# Patient Record
Sex: Male | Born: 1935 | Race: Black or African American | Hispanic: No | Marital: Married | Smoking: Former smoker
Health system: Southern US, Community
[De-identification: ages and names within clinical notes are randomized; demographics above are authoritative.]

## PROBLEM LIST (undated history)

## (undated) DIAGNOSIS — Z8673 Personal history of transient ischemic attack (TIA), and cerebral infarction without residual deficits: Secondary | ICD-10-CM

## (undated) DIAGNOSIS — M1990 Unspecified osteoarthritis, unspecified site: Secondary | ICD-10-CM

## (undated) DIAGNOSIS — R001 Bradycardia, unspecified: Secondary | ICD-10-CM

## (undated) DIAGNOSIS — I5022 Chronic systolic (congestive) heart failure: Secondary | ICD-10-CM

## (undated) DIAGNOSIS — E1151 Type 2 diabetes mellitus with diabetic peripheral angiopathy without gangrene: Secondary | ICD-10-CM

## (undated) DIAGNOSIS — J449 Chronic obstructive pulmonary disease, unspecified: Secondary | ICD-10-CM

## (undated) DIAGNOSIS — I739 Peripheral vascular disease, unspecified: Secondary | ICD-10-CM

## (undated) DIAGNOSIS — Z862 Personal history of diseases of the blood and blood-forming organs and certain disorders involving the immune mechanism: Secondary | ICD-10-CM

## (undated) DIAGNOSIS — K219 Gastro-esophageal reflux disease without esophagitis: Secondary | ICD-10-CM

## (undated) DIAGNOSIS — C61 Malignant neoplasm of prostate: Secondary | ICD-10-CM

## (undated) DIAGNOSIS — I10 Essential (primary) hypertension: Secondary | ICD-10-CM

## (undated) DIAGNOSIS — I2510 Atherosclerotic heart disease of native coronary artery without angina pectoris: Secondary | ICD-10-CM

## (undated) DIAGNOSIS — I495 Sick sinus syndrome: Secondary | ICD-10-CM

## (undated) DIAGNOSIS — I219 Acute myocardial infarction, unspecified: Secondary | ICD-10-CM

## (undated) DIAGNOSIS — E785 Hyperlipidemia, unspecified: Secondary | ICD-10-CM

## (undated) DIAGNOSIS — D649 Anemia, unspecified: Secondary | ICD-10-CM

## (undated) DIAGNOSIS — I779 Disorder of arteries and arterioles, unspecified: Secondary | ICD-10-CM

## (undated) DIAGNOSIS — F0390 Unspecified dementia without behavioral disturbance: Secondary | ICD-10-CM

## (undated) DIAGNOSIS — N184 Chronic kidney disease, stage 4 (severe): Secondary | ICD-10-CM

## (undated) HISTORY — PX: CORONARY ANGIOPLASTY: SHX604

## (undated) HISTORY — DX: Atherosclerotic heart disease of native coronary artery without angina pectoris: I25.10

## (undated) HISTORY — DX: Hyperlipidemia, unspecified: E78.5

## (undated) HISTORY — DX: Personal history of diseases of the blood and blood-forming organs and certain disorders involving the immune mechanism: Z86.2

## (undated) HISTORY — DX: Disorder of arteries and arterioles, unspecified: I77.9

## (undated) HISTORY — DX: Peripheral vascular disease, unspecified: I73.9

## (undated) HISTORY — PX: CORONARY ARTERY BYPASS GRAFT: SHX141

## (undated) HISTORY — DX: Essential (primary) hypertension: I10

## (undated) HISTORY — DX: Chronic obstructive pulmonary disease, unspecified: J44.9

## (undated) HISTORY — DX: Bradycardia, unspecified: R00.1

## (undated) HISTORY — DX: Sick sinus syndrome: I49.5

## (undated) HISTORY — DX: Personal history of transient ischemic attack (TIA), and cerebral infarction without residual deficits: Z86.73

## (undated) HISTORY — DX: Malignant neoplasm of prostate: C61

## (undated) HISTORY — DX: Chronic kidney disease, stage 4 (severe): N18.4

## (undated) HISTORY — DX: Chronic systolic (congestive) heart failure: I50.22

---

## 1997-10-30 ENCOUNTER — Encounter: Payer: Self-pay | Admitting: *Deleted

## 1997-10-30 ENCOUNTER — Ambulatory Visit (HOSPITAL_COMMUNITY): Admission: RE | Admit: 1997-10-30 | Discharge: 1997-10-30 | Payer: Self-pay | Admitting: *Deleted

## 2000-04-27 ENCOUNTER — Encounter: Admission: RE | Admit: 2000-04-27 | Discharge: 2000-04-27 | Payer: Self-pay | Admitting: Internal Medicine

## 2000-04-27 ENCOUNTER — Encounter: Payer: Self-pay | Admitting: Internal Medicine

## 2000-05-21 ENCOUNTER — Encounter (INDEPENDENT_AMBULATORY_CARE_PROVIDER_SITE_OTHER): Payer: Self-pay | Admitting: Specialist

## 2000-05-21 ENCOUNTER — Other Ambulatory Visit: Admission: RE | Admit: 2000-05-21 | Discharge: 2000-05-21 | Payer: Self-pay | Admitting: Urology

## 2000-05-30 ENCOUNTER — Encounter: Admission: RE | Admit: 2000-05-30 | Discharge: 2000-05-30 | Payer: Self-pay | Admitting: Urology

## 2000-05-30 ENCOUNTER — Encounter: Payer: Self-pay | Admitting: Urology

## 2000-06-02 ENCOUNTER — Ambulatory Visit: Admission: RE | Admit: 2000-06-02 | Discharge: 2000-08-31 | Payer: Self-pay | Admitting: Radiation Oncology

## 2000-06-08 ENCOUNTER — Ambulatory Visit (HOSPITAL_COMMUNITY): Admission: RE | Admit: 2000-06-08 | Discharge: 2000-06-08 | Payer: Self-pay | Admitting: Radiation Oncology

## 2002-04-26 ENCOUNTER — Inpatient Hospital Stay (HOSPITAL_COMMUNITY): Admission: AD | Admit: 2002-04-26 | Discharge: 2002-05-15 | Payer: Self-pay | Admitting: Emergency Medicine

## 2002-04-26 ENCOUNTER — Encounter: Payer: Self-pay | Admitting: Emergency Medicine

## 2002-04-29 HISTORY — PX: CARDIAC CATHETERIZATION: SHX172

## 2002-05-01 ENCOUNTER — Encounter (INDEPENDENT_AMBULATORY_CARE_PROVIDER_SITE_OTHER): Payer: Self-pay | Admitting: Cardiovascular Disease

## 2002-05-01 ENCOUNTER — Encounter: Payer: Self-pay | Admitting: Internal Medicine

## 2002-05-02 ENCOUNTER — Encounter: Payer: Self-pay | Admitting: Internal Medicine

## 2002-05-05 ENCOUNTER — Encounter: Payer: Self-pay | Admitting: Internal Medicine

## 2002-05-06 ENCOUNTER — Encounter: Payer: Self-pay | Admitting: Cardiothoracic Surgery

## 2002-05-06 ENCOUNTER — Encounter (INDEPENDENT_AMBULATORY_CARE_PROVIDER_SITE_OTHER): Payer: Self-pay | Admitting: Specialist

## 2002-05-07 ENCOUNTER — Encounter: Payer: Self-pay | Admitting: Cardiothoracic Surgery

## 2002-05-08 ENCOUNTER — Encounter: Payer: Self-pay | Admitting: Cardiothoracic Surgery

## 2002-05-09 ENCOUNTER — Encounter: Payer: Self-pay | Admitting: Cardiothoracic Surgery

## 2002-05-10 ENCOUNTER — Encounter: Payer: Self-pay | Admitting: Cardiothoracic Surgery

## 2002-05-11 ENCOUNTER — Encounter: Payer: Self-pay | Admitting: Cardiothoracic Surgery

## 2002-05-12 ENCOUNTER — Encounter: Payer: Self-pay | Admitting: Cardiothoracic Surgery

## 2002-07-16 ENCOUNTER — Ambulatory Visit (HOSPITAL_COMMUNITY): Admission: RE | Admit: 2002-07-16 | Discharge: 2002-07-16 | Payer: Self-pay | Admitting: *Deleted

## 2002-08-18 ENCOUNTER — Encounter (HOSPITAL_COMMUNITY): Admission: RE | Admit: 2002-08-18 | Discharge: 2002-11-16 | Payer: Self-pay | Admitting: Cardiology

## 2002-11-17 ENCOUNTER — Encounter (HOSPITAL_COMMUNITY): Admission: RE | Admit: 2002-11-17 | Discharge: 2003-02-15 | Payer: Self-pay | Admitting: Cardiology

## 2004-01-08 ENCOUNTER — Ambulatory Visit: Payer: Self-pay | Admitting: Hematology & Oncology

## 2004-03-04 ENCOUNTER — Ambulatory Visit: Payer: Self-pay | Admitting: Hematology & Oncology

## 2004-05-17 ENCOUNTER — Inpatient Hospital Stay (HOSPITAL_COMMUNITY): Admission: EM | Admit: 2004-05-17 | Discharge: 2004-05-27 | Payer: Self-pay | Admitting: Emergency Medicine

## 2004-05-20 ENCOUNTER — Encounter (INDEPENDENT_AMBULATORY_CARE_PROVIDER_SITE_OTHER): Payer: Self-pay | Admitting: Specialist

## 2004-06-03 ENCOUNTER — Ambulatory Visit: Payer: Self-pay | Admitting: Hematology & Oncology

## 2004-07-23 HISTORY — PX: FEMORAL BYPASS: SHX50

## 2005-01-13 ENCOUNTER — Encounter: Admission: RE | Admit: 2005-01-13 | Discharge: 2005-01-13 | Payer: Self-pay | Admitting: Nephrology

## 2005-05-12 ENCOUNTER — Inpatient Hospital Stay (HOSPITAL_COMMUNITY): Admission: EM | Admit: 2005-05-12 | Discharge: 2005-05-18 | Payer: Self-pay | Admitting: Emergency Medicine

## 2005-05-16 ENCOUNTER — Encounter (INDEPENDENT_AMBULATORY_CARE_PROVIDER_SITE_OTHER): Payer: Self-pay | Admitting: Cardiovascular Disease

## 2005-05-17 HISTORY — PX: INSERT / REPLACE / REMOVE PACEMAKER: SUR710

## 2005-09-07 ENCOUNTER — Ambulatory Visit (HOSPITAL_COMMUNITY): Admission: RE | Admit: 2005-09-07 | Discharge: 2005-09-07 | Payer: Self-pay | Admitting: *Deleted

## 2005-10-10 ENCOUNTER — Ambulatory Visit (HOSPITAL_COMMUNITY): Admission: RE | Admit: 2005-10-10 | Discharge: 2005-10-10 | Payer: Self-pay | Admitting: *Deleted

## 2006-01-08 ENCOUNTER — Encounter (HOSPITAL_COMMUNITY): Admission: RE | Admit: 2006-01-08 | Discharge: 2006-04-04 | Payer: Self-pay | Admitting: Family Medicine

## 2007-02-13 ENCOUNTER — Inpatient Hospital Stay (HOSPITAL_COMMUNITY): Admission: EM | Admit: 2007-02-13 | Discharge: 2007-02-16 | Payer: Self-pay | Admitting: Emergency Medicine

## 2007-03-22 ENCOUNTER — Emergency Department (HOSPITAL_COMMUNITY): Admission: EM | Admit: 2007-03-22 | Discharge: 2007-03-22 | Payer: Self-pay | Admitting: Emergency Medicine

## 2007-04-02 ENCOUNTER — Ambulatory Visit: Payer: Self-pay | Admitting: Vascular Surgery

## 2007-04-02 ENCOUNTER — Inpatient Hospital Stay (HOSPITAL_COMMUNITY): Admission: EM | Admit: 2007-04-02 | Discharge: 2007-04-09 | Payer: Self-pay | Admitting: Emergency Medicine

## 2007-04-05 ENCOUNTER — Encounter: Payer: Self-pay | Admitting: Vascular Surgery

## 2007-04-23 ENCOUNTER — Ambulatory Visit: Payer: Self-pay | Admitting: Vascular Surgery

## 2007-05-07 ENCOUNTER — Ambulatory Visit: Payer: Self-pay | Admitting: Vascular Surgery

## 2007-05-16 ENCOUNTER — Encounter (INDEPENDENT_AMBULATORY_CARE_PROVIDER_SITE_OTHER): Payer: Self-pay | Admitting: Orthopedic Surgery

## 2007-05-16 ENCOUNTER — Observation Stay (HOSPITAL_COMMUNITY): Admission: RE | Admit: 2007-05-16 | Discharge: 2007-05-17 | Payer: Self-pay | Admitting: Orthopedic Surgery

## 2007-06-28 ENCOUNTER — Ambulatory Visit: Payer: Self-pay | Admitting: Vascular Surgery

## 2007-11-01 ENCOUNTER — Ambulatory Visit: Payer: Self-pay | Admitting: Vascular Surgery

## 2008-03-02 ENCOUNTER — Ambulatory Visit (HOSPITAL_COMMUNITY): Admission: RE | Admit: 2008-03-02 | Discharge: 2008-03-02 | Payer: Self-pay | Admitting: Urology

## 2008-05-08 ENCOUNTER — Ambulatory Visit: Payer: Self-pay | Admitting: Vascular Surgery

## 2008-05-12 ENCOUNTER — Inpatient Hospital Stay (HOSPITAL_COMMUNITY): Admission: EM | Admit: 2008-05-12 | Discharge: 2008-05-14 | Payer: Self-pay | Admitting: Emergency Medicine

## 2008-07-17 ENCOUNTER — Ambulatory Visit (HOSPITAL_BASED_OUTPATIENT_CLINIC_OR_DEPARTMENT_OTHER): Admission: RE | Admit: 2008-07-17 | Discharge: 2008-07-17 | Payer: Self-pay | Admitting: Urology

## 2008-11-20 ENCOUNTER — Ambulatory Visit: Payer: Self-pay | Admitting: Vascular Surgery

## 2009-03-15 ENCOUNTER — Encounter: Admission: RE | Admit: 2009-03-15 | Discharge: 2009-03-15 | Payer: Self-pay | Admitting: Internal Medicine

## 2009-05-21 ENCOUNTER — Ambulatory Visit: Payer: Self-pay | Admitting: Vascular Surgery

## 2009-11-13 ENCOUNTER — Encounter: Payer: Self-pay | Admitting: Internal Medicine

## 2009-11-30 ENCOUNTER — Ambulatory Visit: Payer: Self-pay | Admitting: Internal Medicine

## 2009-11-30 DIAGNOSIS — Z950 Presence of cardiac pacemaker: Secondary | ICD-10-CM

## 2009-11-30 DIAGNOSIS — I2510 Atherosclerotic heart disease of native coronary artery without angina pectoris: Secondary | ICD-10-CM

## 2009-12-14 ENCOUNTER — Encounter: Payer: Self-pay | Admitting: Internal Medicine

## 2010-01-03 ENCOUNTER — Ambulatory Visit: Payer: Self-pay | Admitting: Vascular Surgery

## 2010-01-12 ENCOUNTER — Ambulatory Visit: Payer: Self-pay | Admitting: Cardiovascular Disease

## 2010-02-12 ENCOUNTER — Encounter: Payer: Self-pay | Admitting: *Deleted

## 2010-02-13 ENCOUNTER — Encounter: Payer: Self-pay | Admitting: *Deleted

## 2010-02-24 NOTE — Cardiovascular Report (Signed)
Summary: Office Visit   Office Visit   Imported By: Sallee Provencal 12/10/2009 16:26:29  _____________________________________________________________________  External Attachment:    Type:   Image     Comment:   External Document

## 2010-02-24 NOTE — Miscellaneous (Signed)
Summary: Device preload  Clinical Lists Changes  Observations: Added new observation of PPM INDICATN: A-fib (11/13/2009 13:01) Added new observation of MAGNET RTE: BOL 98.6 ERI 86.3 (11/13/2009 13:01) Added new observation of PPMLEADSTAT2: active (11/13/2009 13:01) Added new observation of PPMLEADSER2: [id number] (11/13/2009 13:01) Added new observation of PPMLEADMOD2: 1336T (11/13/2009 13:01) Added new observation of PPMLEADDOI2: 05/17/2005 (11/13/2009 13:01) Added new observation of PPMLEADLOC2: RV (11/13/2009 13:01) Added new observation of PPMLEADSTAT1: active (11/13/2009 13:01) Added new observation of PPMLEADSER1: [id number] (11/13/2009 13:01) Added new observation of PPMLEADMOD1: [id number] (11/13/2009 13:01) Added new observation of PPMLEADDOI1: 05/17/2005 (11/13/2009 13:01) Added new observation of PPMLEADLOC1: RA (11/13/2009 13:01) Added new observation of PPM DOI: 05/17/2005 (11/13/2009 13:01) Added new observation of PPM SERL#: [id number]  (11/13/2009 13:01) Added new observation of PPM MODL#: [id number]  (11/13/2009 13:01) Added new observation of PACEMAKERMFG: St Jude  (11/13/2009 13:01) Added new observation of PPM IMP MD: Roe Rutherford, MD  (11/13/2009 13:01) Added new observation of PPM REFER MD: Vira Agar  (11/13/2009 13:01) Added new observation of PACEMAKER MD: Cristopher Peru, MD  (11/13/2009 13:01)      PPM Specifications Following MD:  Cristopher Peru, MD     Referring MD:  Vira Agar PPM Vendor:  [employer] Jude     PPM Model Number:  [id number]     PPM Serial Number:  [id number] PPM DOI:  05/17/2005     PPM Implanting MD:  Roe Rutherford, MD  Lead 1    Location: RA     DOI: 05/17/2005     Model #: [id number]     Serial #: [id number]     Status: active Lead 2    Location: RV     DOI: 05/17/2005     Model #: [id number]     Serial #: [id number]     Status: active  Magnet Response Rate:  BOL 98.6 ERI 86.3  Indications:  A-fib

## 2010-02-24 NOTE — Progress Notes (Signed)
Summary: [facility]: Progress Note  [facility]: Progress Note   Imported By: Roddie Mc 12/14/2009 11:24:50  _____________________________________________________________________  External Attachment:    Type:   Image     Comment:   External Document

## 2010-02-24 NOTE — Assessment & Plan Note (Signed)
Summary: pacer check/st. jude   History of Present Illness: Colin [name] is referred today by Dr. Acie Fredrickson for ongoing PPM evaluation.  The patient has a h/o symptomatic bradycardia and underwent PPM in 2007. He also has a h/o CAD and underwent CABG in 2004.  He has peripheral vascular disease.  He has class 1-2 CHF symptoms and an EF of 35%. He has not had syncope.  Current Medications (verified): 1)  None  Allergies (verified): No Known Drug Allergies  Past History:  Past Medical History: Last updated: 11/29/2009  Coronary disease, status post coronary bypass grafting 2004  Peripheral vascular disease, status post right fem-pop bypass in       2006, right toe amputation  Tachybrady syndrome, status post pacemaker implantation April 2007  Chronic renal insufficiency, creatinine baseline of 1.6 to 1.8  Cardiomyopathy  Hypertension  Cerebrovascular accident  Hyperlipidemia  COPD with 75 pack year history.  States he has not smoked since he had his myocardial infarction.   Appendectomy  Cholecystectomy  Cataract extraction  Chronic normocytic anemia with negative EGD and colonoscopy in 2004  Past Surgical History: Last updated: 11/29/2009 Cryoablation of prostate and insertion of suprapubic tube-2010 right toe amputation status post right fem-pop bypass in 2006 Cataract extraction  Family History: Non-contributory  Social History: Married Denies tobacco or ETOH abuse  Review of Systems       All systems reveiwed and negative except as noted in the HPI except for minimal claudication  Vital Signs:  Patient profile:   77 year old male Height:      71 inches Weight:      206 pounds BMI:     28.84 Pulse rate:   64 / minute BP sitting:   158 / 70  (left arm)  Vitals Entered By: Margaretmary Bayley CMA (November 30, 2009 2:14 PM) Comments Pt did not bring med list and does not know what medications he is on.   Physical Exam  General:  Well developed, well nourished, in no  acute distress. Head:  normocephalic and atraumatic Eyes:  PERRLA/EOM intact; conjunctiva and lids normal. Mouth:  Teeth, gums and palate normal. Oral mucosa normal. Neck:  Neck supple, no JVD. No masses, thyromegaly or abnormal cervical nodes. Chest Wall:  Well healed PPM incision Lungs:  Clear bilaterally to auscultation with no wheezes, rales, or rhonchi. Heart:  RRR with normal S1 and S2.  PMI is not enlarged or laterally displaced. No murmur. Abdomen:  Bowel sounds positive; abdomen soft and non-tender without masses, organomegaly, or hernias noted. No hepatosplenomegaly. Msk:  Back normal, normal gait. Muscle strength and tone normal. Neurologic:  Alert and oriented x 3.   PPM Specifications Following MD:  Cristopher Peru, MD     Referring MD:  Vira Agar PPM Vendor:  [facility] Jude     PPM Model Number:  [id number]     PPM Serial Number:  [id number] PPM DOI:  05/17/2005     PPM Implanting MD:  Roe Rutherford, MD  Lead 1    Location: RA     DOI: 05/17/2005     Model #: [id number]     Serial #: [id number]     Status: active Lead 2    Location: RV     DOI: 05/17/2005     Model #: [id number]     Serial #: [id number]     Status: active  Magnet Response Rate:  BOL 98.6 ERI 86.3  Indications:  A-fib   PPM Follow Up Remote  Check?  No Battery Voltage:  2.80 V     Battery Est. Longevity:  8.5 years     Pacer Dependent:  No       PPM Device Measurements Atrium  Amplitude: 1.3 mV, Impedance: 485 ohms, Threshold: 0.5 V at 0.5 msec Right Ventricle  Amplitude: 12 mV, Impedance: 417 ohms, Threshold: 0.375 V at 0.5 msec  Episodes MS Episodes:  4     Percent Mode Switch:  <1%     Atrial Pacing:  80%     Ventricular Pacing:  1%  Parameters Mode:  DDDR     Lower Rate Limit:  60     Upper Rate Limit:  110 Paced AV Delay:  275     Sensed AV Delay:  250 Next Cardiology Appt Due:  05/24/2010 Tech Comments:  Ventricular autocapture on.  Device function normal.   ROV 6 month clinic. Alma Friendly, LPN  November  8, 624THL  2:31 PM  MD Comments:  Agree with above.  Impression & Recommendations:  Problem # 1:  CARDIAC PACEMAKER IN SITU (ICD-V45.01) His current device is working normally and was reprogrammed today for improved longevity.  Will recheck in several months.  Problem # 2:  CORONARY ARTERY DISEASE (ICD-414.00) He denies anginal symptoms and will followup with Dr. Acie Fredrickson.  Problem # 3:  CHRONIC SYSTOLIC HEART FAILURE (123456) His symptoms appear to be class 1-2.  He will continue his current meds. ( He did not know his meds today).

## 2010-05-02 LAB — GLUCOSE, CAPILLARY
Glucose-Capillary: 272 mg/dL — ABNORMAL HIGH (ref 70–99)
Glucose-Capillary: 333 mg/dL — ABNORMAL HIGH (ref 70–99)

## 2010-05-02 LAB — POCT HEMOGLOBIN-HEMACUE: Hemoglobin: 11.9 g/dL — ABNORMAL LOW (ref 13.0–17.0)

## 2010-05-04 LAB — GLUCOSE, CAPILLARY
Glucose-Capillary: 167 mg/dL — ABNORMAL HIGH (ref 70–99)
Glucose-Capillary: 222 mg/dL — ABNORMAL HIGH (ref 70–99)
Glucose-Capillary: 78 mg/dL (ref 70–99)
Glucose-Capillary: 88 mg/dL (ref 70–99)

## 2010-05-04 LAB — COMPREHENSIVE METABOLIC PANEL
ALT: 34 U/L (ref 0–53)
AST: 26 U/L (ref 0–37)
Albumin: 3 g/dL — ABNORMAL LOW (ref 3.5–5.2)
Alkaline Phosphatase: 105 U/L (ref 39–117)
BUN: 64 mg/dL — ABNORMAL HIGH (ref 6–23)
Calcium: 9.1 mg/dL (ref 8.4–10.5)
Creatinine, Ser: 3.21 mg/dL — ABNORMAL HIGH (ref 0.4–1.5)
GFR calc Af Amer: 33 mL/min — ABNORMAL LOW (ref >60)
Glucose, Bld: 387 mg/dL — ABNORMAL HIGH (ref 70–99)
Potassium: 6 mEq/L — ABNORMAL HIGH (ref 3.5–5.1)
Sodium: 134 mEq/L — ABNORMAL LOW (ref 135–145)
Total Protein: 6.6 g/dL (ref 6.0–8.3)
Total Protein: 6.9 g/dL (ref 6.0–8.3)

## 2010-05-04 LAB — DIFFERENTIAL
Basophils Absolute: 0.1 10*3/uL (ref 0.0–0.1)
Basophils Relative: 1 % (ref 0–1)
Eosinophils Absolute: 0.1 10*3/uL (ref 0.0–0.7)
Eosinophils Relative: 1 % (ref 0–5)
Lymphocytes Relative: 17 % (ref 12–46)
Lymphs Abs: 1.1 10*3/uL (ref 0.7–4.0)
Lymphs Abs: 1.8 10*3/uL (ref 0.7–4.0)
Monocytes Absolute: 0.5 10*3/uL (ref 0.1–1.0)
Monocytes Relative: 10 % (ref 3–12)
Monocytes Relative: 8 % (ref 3–12)
Neutrophils Relative %: 52 % (ref 43–77)

## 2010-05-04 LAB — CARDIAC PANEL(CRET KIN+CKTOT+MB+TROPI)
Relative Index: 3.1 — ABNORMAL HIGH (ref 0.0–2.5)
Total CK: 372 U/L — ABNORMAL HIGH (ref 7–232)
Total CK: 386 U/L — ABNORMAL HIGH (ref 7–232)

## 2010-05-04 LAB — CBC
HCT: 34.4 % — ABNORMAL LOW (ref 39.0–52.0)
HCT: 36.4 % — ABNORMAL LOW (ref 39.0–52.0)
Hemoglobin: 11.5 g/dL — ABNORMAL LOW (ref 13.0–17.0)
MCV: 80.3 fL (ref 78.0–100.0)
MCV: 80.6 fL (ref 78.0–100.0)
RBC: 4.26 MIL/uL (ref 4.22–5.81)
RBC: 4.53 MIL/uL (ref 4.22–5.81)
RDW: 16 % — ABNORMAL HIGH (ref 11.5–15.5)
WBC: 5.1 10*3/uL (ref 4.0–10.5)
WBC: 6.2 10*3/uL (ref 4.0–10.5)

## 2010-05-04 LAB — CREATININE, URINE, RANDOM: Creatinine, Urine: 65.6 mg/dL

## 2010-05-04 LAB — URINE MICROSCOPIC-ADD ON

## 2010-05-04 LAB — CK TOTAL AND CKMB (NOT AT ARMC)
CK, MB: 13.4 ng/mL — ABNORMAL HIGH (ref 0.3–4.0)
Relative Index: 3.3 — ABNORMAL HIGH (ref 0.0–2.5)

## 2010-05-04 LAB — URINALYSIS, ROUTINE W REFLEX MICROSCOPIC
Glucose, UA: >1000 mg/dL — AB
Nitrite: NEGATIVE
Urobilinogen, UA: 0.2 mg/dL (ref 0.0–1.0)
pH: 5 (ref 5.0–8.0)

## 2010-05-04 LAB — HEPARIN LEVEL (UNFRACTIONATED): Heparin Unfractionated: <0.1 IU/mL — ABNORMAL LOW (ref 0.30–0.70)

## 2010-05-04 LAB — TROPONIN I: Troponin I: 0.04 ng/mL (ref 0.00–0.06)

## 2010-05-04 LAB — SODIUM, URINE, RANDOM: Sodium, Ur: 76 mEq/L

## 2010-05-04 LAB — UREA NITROGEN, URINE: Urea Nitrogen, Ur: 504 mg/dL

## 2010-06-07 NOTE — Assessment & Plan Note (Signed)
OFFICE VISIT   Colin [name], Colin [name]  DOB:  1928-01-26                                       11/20/2008  [id number]   Patient presents today for continued follow-up of his lower extremity  arterial insufficiency.  He continues to do well.  He does have a healed  right transmetatarsal amputation.  He denies any rest pain.  He does  have some hip pain.  He reports this occurs mostly with walking and does  appear to be in the joint itself.  It is not severely limiting to him  and is relieved when he sits.  It does make it somewhat difficult for  walking.  He has some more typical calf claudication-type symptoms too.  He reports this does not limit his walking ability and also stops when  he stops walking.   REVIEW OF SYSTEMS:  He has no cardiac or pulmonary dysfunction.  He does  have some mild renal dysfunction and is diabetic.   PHYSICAL EXAMINATION:  Well-developed and well-nourished black male  appearing stated age of 60.  His radial pulses and femoral pulses are 2+  bilaterally.  He has absent popliteal and distal pulses bilaterally.  He  has a well-healed transmetatarsal amputation on the right.  He has no  lesions on either feet and does not have any rashes.  He is grossly  intact neurologically.   I reviewed his noninvasive studies from today in our office.  This  reveals an ankle-arm index on the right of 0.29 and on the left of 0.54.  This is down slightly on the right and up slightly on the left.  I  discussed his known occlusion of his fem-pop bypass.  I have recommend  that he continue his walking program.  He will notify us should he  develop any rest pain or ulcerations.  Otherwise will be seen in our  vascular lab per protocol.   Rosetta Posner, M.D.  Electronically Signed   TFE/MEDQ  D:  11/20/2008  T:  11/23/2008  Job:  [id number]   cc:   Lynnell Chad. Shelia Media, M.D.

## 2010-06-07 NOTE — H&P (Signed)
NAME:  Colin [name], Colin [name] NO.:  [id number]   MEDICAL RECORD NO.:  [id number]          PATIENT TYPE:  INP   LOCATION:  1825                         FACILITY:  [facility]   PHYSICIAN:  Rosetta Posner, M.D.    DATE OF BIRTH:  [birth year]   DATE OF ADMISSION:  04/02/2007  DATE OF DISCHARGE:                              HISTORY & PHYSICAL   CHIEF COMPLAINT:  Pain in right foot and possible infection.   HISTORY OF PRESENT ILLNESS:  This is a 77 year old black male with a  history of right great toe ulceration which led to a right great toe  amputation on May 24, 2004.  Today, the patient went to his kidney  doctor, Dr. Justin Mend, and was also complaining of pain in his right foot.  Dr. Justin Mend felt it was appropriate to call [facility] for consultation.  Patient  later came to [facility] emergency department, and Dr. Donnetta Hutching saw the  patient.  The patient states that he has had right foot pain for several  weeks now.  The patient has taken Tylenol with no relief.  The patient  states that nothing alleviates the pain.  The patient is having pain at  rest and is unable to bear weight on foot.  The patient is not currently  taking any antibiotics for the right foot.  The patient does have a  history of a right femoral-to-popliteal bypass graft done in July, 2006,  and patient did not come to any of his follow-up appointments at [facility].  Per Dr. Donnetta Hutching, it is appropriate at this time to admit the patient to  [facility] for hydration and to complete an angiogram to  further evaluate the patient's options.  Patient will also be given 1 gm  Ancef IV now and then will continue that every 6 hours.  Also, the  patient will be given Percocet 5/500 1-2 tablets every 4 hours as needed  for pain.   REVIEW OF SYSTEMS:  See history of present illness for pertinent  positives and negatives.  Patient was just discharged from the hospital  on February 16, 2007 for acute-on-chronic renal failure, hypokalemia,  uncontrolled diabetes, hypertension, and hyperlipidemia.  Patient mainly  complains currently of pain in his right foot, probably due to  infection.   ALLERGIES:  Patient has no known drug allergies.   PAST MEDICAL HISTORY:  1. Chronic renal insufficiency with a baseline creatinine of 1.6 in      April, 2007.  Currently, it is at 1.9.  2. Diabetes mellitus type 2, uncontrolled.  3. Coronary artery disease with a history of MI, status post coronary      artery bypass graft at four vessels in April, 2004.  4. Peripheral vascular disease, status post right fem-pop bypass in      July, 2006 with right great toe amputation.  5. Tachy-brady syndrome, status post pacemaker placement in April,      2007.  6. Cardiomyopathy.  7. Hypertension.  8. Cerebrovascular accident.  9. Hyperlipidemia.  10.COPD with 75-pack-year history of tobacco use.  Discontinued since  2004.  11.Status post appendectomy.  12.Status post cholecystectomy.  13.Status post cataract extraction.  14.Chronic normocytic anemia with hemoglobin baseline anywhere from      9.5 to 11.5 with negative EGD and colonoscopy in June, 2004.   FAMILY HISTORY:  Noncontributory.   SOCIAL HISTORY:  Patient is not presently smoking but does have a  history of smoking and discontinued since 2004.  He is married and lives  in [place] with his wife.   OUTPATIENT MEDICATIONS:  1. Zetia 10 mg daily.  2. Aspirin 325 mg daily.  3. Detrol LA daily with meals.  4. Lantus 60 units daily with meals.  5. Metoprolol XL 25 mg daily.  6. Norvasc 5 mg daily.  7. Ramipril 10 mg daily.  8. Vitamin B12, dose unclear.   LABS:  None current.   PHYSICAL EXAMINATION:  VITAL SIGNS:  Blood pressure 140/72, heart rate  69, respirations 18, temperature 98.7, oxygen is 98% at this time on  room air.  GENERAL:  Well-developed and well-nourished gentleman in no acute  respiratory distress.  HEENT:  Normocephalic and atraumatic.  Pupils are  equal, round and  reactive to light and accommodation.  Extraocular movements are intact.  Exam of the external ears and nose revealed no abnormalities.  Oropharynx is clear with upper dentures in place and edentulous lower.  NECK:  Supple without lymphadenopathy, thyromegaly, or carotid bruits.  HEART:  Regular rate and rhythm with soft 2/6 systolic murmur heard best  at the left upper sternal border.  Patient also has a median sternotomy  scar.  LUNGS:  Clear to auscultation bilaterally.  ABDOMEN:  Soft, nontender, nondistended with active bowel sounds in all  quadrants.  No masses or hepatosplenomegaly.  He does have a small  umbilical hernia.  GENITAL/RECTAL:  Deferred.  EXTREMITIES:  Patient does have a scar in a bridge pattern from his  right thigh to just below his right knee and also a small scar at his  left knee.  Both of these are healed.  Patient's right foot is mildly  tender.  Dusky between third and fourth toes.  No abscess.  A 2+ right  femoral pulse.  A brisk Doppler at the right popliteal shin.  Doubt  Doppler flow in right dorsalis pedis and posterior tibials.  NEUROLOGIC:  Cranial nerves II-XII are grossly intact.  He is alert and  oriented x3.  Did not assess patient's gait.  He has symmetrical  strength in his upper and lower extremities.   ASSESSMENT/PLAN:  This is a 77 year old male with a history of  peripheral vascular disease and claudication who presents with right  foot pain.  The patient also has a past history of right great toe  amputation in July, 2006.  Patient will be admitted by Dr. Donnetta Hutching and  will be started on IV antibiotics and hydration with an angiogram in the  morning of April 03, 2007.      Darlin Coco, PA      Rosetta Posner, M.D.  Electronically Signed    ALW/MEDQ  D:  04/02/2007  T:  04/02/2007  Job:  [id number]

## 2010-06-07 NOTE — Op Note (Signed)
NAME:  Colin [name], Colin [name] NO.:  [id number]   MEDICAL RECORD NO.:  [id number]          PATIENT TYPE:  AMB   LOCATION:                               FACILITY:  [facility]   PHYSICIAN:  Lucina Mellow. Terance Hart, M.D.DATE OF BIRTH:  August 15, 1928   DATE OF PROCEDURE:  DATE OF DISCHARGE:                               OPERATIVE REPORT   PREOPERATIVE DIAGNOSIS:  Recurrent prostatic carcinoma.   POSTOPERATIVE DIAGNOSIS:  Recurrent prostatic carcinoma.   PROCEDURE:  Cryoablation of prostate and insertion of suprapubic tube.   SURGEON:  Orion Crook, MD   ANESTHESIA:  General.   INDICATIONS:  This 77 year old gentleman underwent radiation therapy in  2002 for a Gleason 8 carcinoma of the prostate and at that time his PSA  was 8.  His PSA went down to 0.65 but his PSA is now back up to 11.4 and  a biopsy showed recurrent carcinoma.  He was counseled about  cryoablation.  He was brought to the OR for that reason.  He was advised  about the risk of bleeding, infection, retention or urinary incontinence  postop.   PROCEDURE IN DETAIL:  The patient was brought to the operating room and  placed in lithotomy position after the induction of satisfactory general  anesthesia.  He was prepped and draped in the usual fashion.  He was  cystoscoped and the prostate was small.  The bladder had some  trabeculation.  I filled the bladder with irrigating fluid and then made  a puncture wound above the symphysis pubis and inserted a percutaneous  suprapubic tube through the anterior bladder wall and then produced a  full coil in the SP tube which was then connected to a drainage catheter  and the round silicone disk was attached to the SP tube and sewn to the  abdominal skin to secure the catheter in place.  Foley catheter was then  inserted.  Transrectal ultrasound was inserted and he underwent  treatment planning and he had a 25 gram prostate.  There were two  needles placed in the top row, two  needles placed in the middle row and  three needles placed in the bottom row.  I then recystoscoped him and  inserted a guidewire over which the urethral warming catheter was  inserted and noted to be in good position and functioned well.  He then  underwent two freeze and thaw cycles with good visual effect of the  freezing and a nice flat freeze curve on the bottom of the prostate  floor both on sagittal and transverse views.  Temperature sensors had  been placed in the Denonvilliers fascia and external sphincter and  remained at safe temperatures.  After completion of 20  minutes of the warming cycle after the second freeze the warming  catheter was removed and a suprapubic tube connected to closed drainage.  He was taken to the recovery room in good condition.  Blood loss was  minimal.  He tolerated the procedure well.      Lucina Mellow. Terance Hart, M.D.  Electronically Signed     LJP/MEDQ  D:  07/17/2008  T:  07/17/2008  Job:  [id number]   cc:   Lynnell Chad. Shelia Media, M.D.  Fax: [phone]

## 2010-06-07 NOTE — Op Note (Signed)
NAME:  Colin [name], HARVARD NO.:  [id number]   MEDICAL RECORD NO.:  [id number]          PATIENT TYPE:  INP   LOCATION:  [id number]                         FACILITY:  [facility]   PHYSICIAN:  Dorothea Glassman, M.D.    DATE OF BIRTH:  02-03-1928   DATE OF PROCEDURE:  04/03/2007  DATE OF DISCHARGE:                               OPERATIVE REPORT   PHYSICIAN:  Gordy Clement, MD.   DIAGNOSIS:  Ischemic right foot.   PROCEDURE:  Right lower extremity arteriogram.   ACCESS:  Left common femoral artery 5-French sheath.   CONTRAST:  40 miles Visipaque.   COMPLICATIONS:  None apparent.   CLINICAL NOTE:  [name] is a 77 year old male with diabetes and  chronic renal insufficiency and a history of peripheral vascular  disease.  Previous right femoral-popliteal bypass with toe amputation.  Presented with ulceration and pain of his right foot for a couple of  weeks.  Admitted to hospital yesterday and scheduled today to undergo  right lower extremity arteriography.   PROCEDURE NOTE:  The patient brought to the cath lab in stable  condition.  Placed in the supine position.  Both groins were prepped and  draped in sterile fashion.  Skin and subcutaneous tissue left groin  instilled with 1% Xylocaine.  A needle easily induced in the left common  femoral artery.  0.035 Wholey guidewire advanced through the needle into  the mid abdominal aorta.  The needle removed.  A 5-French sheath  advanced over guidewire.  Dilator removed, sheath flushed with heparin  saline solution.   A crossover catheter was then advanced over the guidewire and engaged in  the right common iliac artery.   Right iliac arteriography obtained in the LAO projection.  This revealed  the right common and external iliac arteries to be widely patent without  significant stenosis.  The right hypogastric artery was intact with a  moderate proximal stenosis.   A angled Glide wire was then advanced through the catheter  down into the  right external iliac artery.  The catheter exchange then made for an end-  hole catheter  which was advanced into the right external iliac artery.  Right lower extremity arteriography obtained.  This revealed the distal  right external iliac and right common femoral arteries to be patent.  The right profunda femoris artery was intact.  The proximal right  superficial femoral artery was patent.  The right mid thigh and abductor  area revealed severe disease with multiple areas of high-grade stenosis  in the right superficial femoral artery.  There was an occlusion of the  distal right superficial femoral artery, proximal right popliteal artery  with reconstitution of the popliteal artery at the level of the patella  and single-vessel diseased peroneal runoff to the right foot.   This completed the arteriogram procedure.  There were no apparent  complications.  The patient transferred to holding area in stable  condition.  Total contrast load was 40 mL.   FINAL IMPRESSION:  1. Patent right aorto-iliac segment.  2. Patent right common femoral artery.  3. Patent  and intact right profunda femoris artery.  4. Severely diseased right superficial femoral artery with multiple      areas of critical stenosis and occlusion of the distal right      superficial femoral artery popliteal junction.  Reconstitution of      the popliteal artery at the patella level with single-vessel      diseased right peroneal runoff.      Dorothea Glassman, M.D.  Electronically Signed     PGH/MEDQ  D:  04/03/2007  T:  04/04/2007  Job:  [id number]   cc:   Sherril Croon, M.D.

## 2010-06-07 NOTE — Procedures (Signed)
BYPASS GRAFT EVALUATION   INDICATION:  Right lower extremity bypass graft.   HISTORY:  Diabetes:  Yes.  Cardiac:  Yes.  Hypertension:  Yes.  Smoking:  No.  Previous Surgery:  Right fem-pop bypass graft on 04/11/2007 by Dr.  Kellie Simmering.   SINGLE LEVEL ARTERIAL EXAM                               RIGHT              LEFT  Brachial:                    142                139  Anterior tibial:             Not detected       65  Posterior tibial:            Not detected       60  Peroneal:                    61  Ankle/brachial index:        0.43               0.46   PREVIOUS ABI:  Date:  11/01/2007  RIGHT:  0.75  LEFT:  0.52   LOWER EXTREMITY BYPASS GRAFT DUPLEX EXAM:   DUPLEX:  No flow was detected in the right lower extremity bypass graft  with flow noted in the distal native vessel.  Flow was noted in the  right lower extremity popliteal artery most likely due to collateral  circulation.   IMPRESSION:  1. No Doppler flow as detected in the right lower extremity bypass      graft.  2. Significant decrease in the right ABI noted when compared to the      previous exam with the left ABI remaining stable.   ___________________________________________  Rosetta Posner, M.D.   CH/MEDQ  D:  05/08/2008  T:  05/08/2008  Job:  [id number]

## 2010-06-07 NOTE — Op Note (Signed)
NAME:  THOREN, KOBY NO.:  [id number]   MEDICAL RECORD NO.:  [id number]          PATIENT TYPE:  INP   LOCATION:  3310                         FACILITY:  [facility]   PHYSICIAN:  Nelda Severe. Kellie Simmering, M.D.  DATE OF BIRTH:  18-Dec-1928   DATE OF PROCEDURE:  04/05/2007  DATE OF DISCHARGE:                               OPERATIVE REPORT   PREOPERATIVE DIAGNOSIS:  Severe femoral popliteal and tibial occlusive  disease, right leg, with ischemic changes, right foot.   POSTOPERATIVE DIAGNOSIS:  Severe femoral popliteal and tibial occlusive  disease, right leg, with ischemic changes, right foot.   OPERATION:  Redo right common femoral to popliteal (above-knee) bypass  using 6-mm Gore-Tex (Propaten) bypass with intraoperative arteriogram.   SURGEON:  Nelda Severe. Kellie Simmering, M.D.   FIRST ASSISTANT:  Jacinta Shoe, P.A.   ANESTHESIA:  Is spinal.   PROCEDURE:  The patient was taken to the operating room, was placed in  the lateral position at which time satisfactory spinal anesthesia was  administered.  The patient was returned to the supine position.  Right  leg was prepped with Betadine scrub and solution and draped in routine  sterile manner.  Short longitudinal incision was made in the right  inguinal area, carried down through subcutaneous tissue.  There was an  old occluded femoral-popliteal Gore-Tex graft.  Proximal control was  obtained at the inguinal ligament at the distal external iliac artery  and superficial profunda femoris arteries dissected free for control  distally.  Medial incision was made through the previous scar above the  knee where the femoral-popliteal Gore-Tex graft was inserted previously.  Vein had been previously removed for heart surgery.  The popliteal  artery was exposed above the knee where the previous anastomosis had  been performed and was then exposed down as far as possible in the above-  knee position.  There was lot of dense scar tissue in  this area.  A new  6-mm Gore-Tex (Propaten) graft was tunneled in a subfascial plane.  The  patient was heparinized.  The popliteal artery was occluded proximally  and distally.  The previous Gore-Tex graft excised from the popliteal  artery.  Arteriotomy extended very slightly distally.  A 4.5 dilator  would traverse this vessel into the below-knee popliteal level.  There  was known severe tibial disease below the knee.  The Propaten graft was  anastomosed end-to-side with 6-0 Prolene.  Following this, attention  turned to the inguinal area where the vessels occluded with vascular  clamps, the previous Gore-Tex transected and excised almost completely  from the femoral artery which was a thickened diseased vessel but did  have excellent inflow.  Arteriotomy was extended slightly proximally,  Gore-Tex spatulated and anastomosed end-to-side using continuous 6-0  Prolene.  Clamps then released.  There was an excellent pulse in the  graft and good Doppler flow in the popliteal artery distal to the bypass  and audible Doppler flow in the posterior tibial artery at the ankle.  Intraoperative arteriogram revealed a widely  patent anastomosis with one-vessel runoff through what appeared to be  peroneal artery.  Protamine was given to reverse the heparin.  Following  adequate hemostasis, wounds were irrigated with saline, closed in layers  with Vicryl in a subcuticular fashion.  Sterile dressing applied.  The  patient taken to the recovery in satisfactory condition.      Nelda Severe Kellie Simmering, M.D.  Electronically Signed     JDL/MEDQ  D:  04/05/2007  T:  04/06/2007  Job:  [id number]

## 2010-06-07 NOTE — Discharge Summary (Signed)
NAME:  Colin [name], Colin [name] NO.:  [id number]   MEDICAL RECORD NO.:  [id number]          PATIENT TYPE:  INP   LOCATION:  2032                         FACILITY:  [facility]   PHYSICIAN:  Nelda Severe. Kellie Simmering, M.D.  DATE OF BIRTH:  01-Apr-1928   DATE OF ADMISSION:  04/02/2007  DATE OF DISCHARGE:                               DISCHARGE SUMMARY   Patient of Nelda Severe. Kellie Simmering, M.D.   CONSULTING PHYSICIAN:  Dr. Sharol Given.   PRINCIPAL DIAGNOSES:  1. Peripheral vascular disease with claudication in his right foot.      Patient of Dr. Justin Mend.  2. Chronic renal insufficiency.  3. Diabetes mellitus type 2.  4. Coronary artery disease with four vessel coronary artery bypass      grafting April 2004.  5. Peripheral vascular disease status post right fem-pop bypass July      2006 with right great toe amputation.  6. Tachybrady syndrome status post pacemaker placement April 2007.  7. Cardiomyopathy,  8. Hypertension.  9. History of CVA.  10.Hyperlipidemia.  11.COPD.  12.Chronic normocytic anemia with a baseline hemoglobin 9.5 to 11.5      with a negative EGD and colonoscopy June 2004.   PROCEDURES PERFORMED:  1. Right lower extremity arteriogram 04/04/2007.  2. Redo right femoropopliteal arterial bypass using 6 mm propaten      graft by Dr. Kellie Simmering.   DISCHARGE MEDICATIONS:  1. Aspirin 325 mg p.o. nightly.  2. Detrol LA 4 mg p.o. nightly.  3. Lantus insulin 50 units subQ nightly.  4. Metoprolol tartrate 25 mg p.o. daily.  5. NovoLog sliding scale subQ.  6. Altace 10 mg p.o. daily.  7. Vitamin B12.  8. Welchol  9. Zetia 10 mg p.o. daily.  10.Norvasc 5 mg p.o. daily.  11.Percocet 5/325 one p.o. every 4 hours p.r.n. pain.  12.Metanx and nitroglycerin patch to the ankle added by Dr. Rosita Kea.   DISPOSITION:  He is being discharged home in stable condition with his  wounds healing well.  He is instructed to wash his wounds with warm  water and soap.  He is to call for temperature greater than  101.2,  increasing redness, drainage or increased incisional pain.  He is to  call Dr. Jeoffrey Massed office for a followup appointment within the next two  weeks.  He will be given appointment to see Dr. Kellie Simmering in four weeks  with APIs prior to the visit and in two weeks for a wound check.   Brief identifying statement of complete details, please read further  typed history and physical.   Briefly, this is a very pleasant 77 year old gentleman who was referred  to [facility] for pain in his right foot.  He was  evaluated by Dr. Donnetta Hutching who felt that the discomfort was consistent with  claudication symptoms.  He felt he should be admitted for evaluation and  treatment.   HOSPITAL COURSE:  He was admitted to a bed on a convalescent floor.  He  underwent arteriogram on March 12, which had demonstrated poor flow to  his right foot.  It was felt that  he should undergo redo femoropopliteal  bypass.  He was informed of the risks and benefits of the procedure, and  after careful consideration, elected to proceed.   He was taken to the operating room on March 13 by Dr. Kellie Simmering and  underwent the aforementioned redo fem-pop bypass.  For complete details,  please refer to the typed operative report.  The procedure was without  complications.  He was returned to the post anesthesia care unit,  extubated.  Following stabilization he was transferred to a bed on a  surgical convalescent floor.  He was carefully followed.  He did receive  two units of blood for anemia.  This was due to his coronary artery  disease and history of cardiomyopathy with stage 1 chronic kidney  disease.  His insulin was resumed.   He was walking and improving with physical therapy.  His wounds are  healing well.  He was using a walker.  He was felt stable and was  discharged home on anticipated April 09, 2007.      Chad Cordial, PA      Nelda Severe Kellie Simmering, M.D.  Electronically Signed    KEL/MEDQ   D:  04/08/2007  T:  04/08/2007  Job:  [id number]   cc:   Nelda Severe. Kellie Simmering, M.D.  Newt Minion, MD  Sherril Croon, M.D.

## 2010-06-07 NOTE — H&P (Signed)
NAME:  Colin, [name] NO.:  [id number]   MEDICAL RECORD NO.:  [id number]          PATIENT TYPE:  INP   LOCATION:  77                         FACILITY:  [facility]   PHYSICIAN:  Rosetta Posner, M.D.    DATE OF BIRTH:  24-Aug-1928   DATE OF ADMISSION:  04/02/2007  DATE OF DISCHARGE:                              HISTORY & PHYSICAL   PRIMARY CARE PHYSICIAN:  Lynnell Chad. Pharr.   CHIEF COMPLAINT:  Right toe infection.   HISTORY OF PRESENT ILLNESS:  [name] is a 77 year old  gentleman with an extensive medical history as detailed below.  Patient  is having pain in his right root due to right toe infection.  Patient  notified office [facility] and felt it was best for patient to be admitted to  the hospital for hydration and angio for review in the morning.  Patient  does have a past history of a right fem to popliteal bypass graft and a  right great toe amp in July of 2006.  He has not been in for a followup  visit since surgery.  Patient is presenting today with several weeks of  history of pain.  Patient does have a significant past medical history  of chronic renal insufficiency with the creatinine currently at 1.6.  Patient also has history of diabetes and a CABG in 2004.  Dr. Donnetta Hutching saw  the patient in the ER today on arrival and felt that patient could  benefit if we admitted and continued to hydrate patient and have an  angio in the morning and plans for possibly right foot amputation upon  hospital stay.  Patient also will be given Ancef 1 g IV now and every 6  hours.  Patient also will be given Percocet 5/500 one to 2 tablets every  4 hours to help alleviate the pain.   Dictation ended at this point.      Darlin Coco, PA      Rosetta Posner, M.D.  Electronically Signed    ALW/MEDQ  D:  04/02/2007  T:  04/03/2007  Job:  [id number]

## 2010-06-07 NOTE — Discharge Summary (Signed)
NAME:  Colin [name], Colin [name] NO.:  [id number]   MEDICAL RECORD NO.:  [id number]          PATIENT TYPE:  INP   LOCATION:  2031                         FACILITY:  [facility]   PHYSICIAN:  Cherene Altes, M.D.DATE OF BIRTH:  1928-03-27   DATE OF ADMISSION:  02/13/2007  DATE OF DISCHARGE:  02/16/2007                               DISCHARGE SUMMARY   PRIMARY CARE PHYSICIAN:  Lynnell Chad. Shelia Media, M.D.   CARDIOLOGIST:  Iran Sizer, MD.   NEPHROLOGIST:  Sherril Croon, M.D.   DISCHARGE DIAGNOSES:  1. Acute on chronic renal failure.      a.     Baseline creatinine 1.6 to 1.8.      b.     Presenting creatinine 3.2.      c.     Discharge creatinine 1.8.  2. Hyperkalemia secondary to above - resolved.  3. Uncontrolled diabetes mellitus type 2.  4. Coronary artery disease with history of myocardial infarction      status post coronary artery bypass graft April 2004 with 4 vessel      treatment.  5. Peripheral vascular disease status post right fem-pop July 2006 and      status post right great toe amputation with pending right lower      extremity peripheral catheterization and possible stenting.  6. Tachy-brady syndrome status post pacemaker placement April 2007.  7. Cardiomyopathy.  8. Hypertension.  9. History of cerebrovascular accident.  10.Hyperlipidemia.  11.Chronic obstructive pulmonary disease with 75 pack year tobacco      history but currently abstaining.  12.Status post appendectomy.  13.Status post cholecystectomy.  14.Status post cataract extraction.  15.Chronic normocytic anemia likely related to renal disease with      baseline creatinine 9.5 to 11.5 - negative EGD and colonoscopy July      2004.   DISCHARGE MEDICATIONS:  1. Zetia 10 mg p.o. daily.  2. Welchol 625 mg p.o. daily.  3. Metoprolol 25 mg p.o. daily.  4. Vitamin B12 daily.  5. Bayer aspirin 325 mg daily.  6. Detrol LA 2 mg daily.  7. Amlodipine 5 mg daily.  8. Lantus insulin 60 units daily  at bedtime.  9. The patient is instructed to hold Ramipril until further evaluation      by his primary care physician.   FOLLOWUP:  1. The patient is advised to follow up with Dr. Shelia Media in 5-7 days.  At      that time a basic metabolic panel should be obtained to ensure that      the patient's potassium and his creatinine remain balanced.  An      evaluation of the patient's CBG should also be obtained to assess      the need for adjustment of the patient's treatment regimen.  2. The patient is advised to follow up with Dr. Glade Lloyd within 5-7      days, to reschedule his previously scheduled right lower extremity      peripheral arterial catheterization.  Given his acute renal failure      it is likely advisable to avoid a  dye load for at least 10-14 days.      Would certainly advise holding his ACE inhibitor prior to his      procedure as well.   PROCEDURES:  None.   CONSULTATIONS:  None.   HOSPITAL COURSE:  [name] is a very pleasant 77 year old  gentleman with a complex medical history.  He presented to the hospital  on February 13, 2007, after preperipheral arterial cath orders done in  the office revealed severe hyperkalemia and acute renal failure.  The  patient himself was asymptomatic.  The patient was admitted to the acute  unit.  He required a bicarbonate drip.  He required emergency procedures  to provide cardioprotective benefits from hyperkalemia and then  Kayexalate therapy.  With simple hydration the patient's renal function  returned to its baseline.  Bicarb drip was able to be discontinued.  Potassium was stabilized.  ACE inhibitor has been discontinued for now  and the advisability of resuming this in the outpatient setting should  be considered by the patient's primary care physician.   The patient's diabetes proved to be quite difficulty to manage during  his hospital stay as well.  On his home dose of Lantus therapy on a very  restricted diet his  blood sugar actually became quite low on a number of  occasions.  It is felt that he is poorly compliant at home.  At the time  of his discharge the blood sugar has stabilized somewhat but there are  intermittent spells in which it does increase as high as 200.  I am  returning the patient to his home regimen as I do not feel that he is at  risk for hypoglycemia at home as he is very likely to be noncompliant  with his diet, but close monitoring of CBGs in the outpatient setting is  recommended however and continued counseling as to the need for  compliance as has been done in the hospital.   On February 16, 2007, the patient's renal function had improved to his  baseline.  Vital signs were stable and the patient was afebrile.  The  patient was cleared for discharge home on the above listed medical  regimen with followup as described above.   DISCHARGE LABORATORY:  Sodium is 137, potassium 3.5, bicarb 35,  creatinine 1.8 and BUN 33 on February 16, 2007.      Cherene Altes, M.D.  Electronically Signed    JTM/MEDQ  D:  02/16/2007  T:  02/16/2007  Job:  [id number]   cc:   Sherril Croon, M.D.  Iran Sizer, MD  Lynnell Chad. Shelia Media, M.D.

## 2010-06-07 NOTE — Op Note (Signed)
NAME:  Colin [name], HAYEN NO.:  [id number]   MEDICAL RECORD NO.:  [id number]          PATIENT TYPE:  OBV   LOCATION:  [id number]                         FACILITY:  [facility]   PHYSICIAN:  Newt Minion, MD     DATE OF BIRTH:  April 28, 1928   DATE OF PROCEDURE:  05/16/2007  DATE OF DISCHARGE:                               OPERATIVE REPORT   PREOPERATIVE DIAGNOSIS:  Gangrene, right forefoot.   POSTOPERATIVE DIAGNOSIS:  Gangrene, right forefoot.   PROCEDURE:  Right midfoot amputation   SURGEON:  Newt Minion, MD.   ANESTHESIA:  General.   ESTIMATED BLOOD LOSS:  Minimal.   ANTIBIOTICS:  1 g of Kefzol.   DRAINS:  None.   COMPLICATIONS:  None.   TOURNIQUET TIME:  None.   DISPOSITION:  To PACU in stable condition.   INDICATIONS FOR PROCEDURE:  The patient is a 77 year old gentleman with  peripheral vascular disease.  He has had a progressive gangrene of the  right forefoot.  He was not felt to be a revascularization candidate.  He has undergone conservative care with Metanx and nitroglycerin patches  without relief as well as wound care, and presents at this time for  midfoot amputation.  Risks and benefits were discussed including  persistent infection, neurovascular injury, nonhealing of the wound, and  need for a higher-level amputation.  The patient states he understands  and wished to proceed at this time.   DESCRIPTION OF PROCEDURE:  The patient was brought to the OR room 3, and  underwent general anesthetic.  After adequate level of anesthesia  obtained, the patient's right lower extremity was prepped using DuraPrep  and draped in a sterile field, and the gangrenous forefoot was draped  with Charlie Pitter out of the surgical field.  The foot was amputated with a  fishmouth incision through the midfoot.  This was carried sharply down  to bone.  The metatarsals were resected at the base, were then beveled  plantarly and dorsally.  Electrocautery was used for hemostasis.   The  wound was irrigated with normal saline.  There was some petechial  bleeding.  The skin was closed without any  tension on the skin with a far-near-near-far suture with 2-0 nylon.  The  wound was covered with Adaptic, orthopedic sponges, sterile Webril, and  a Coban dressing.  The patient was extubated and taken to PACU in stable  condition.   Plan for 23-hour observation, discharge in the morning.      Newt Minion, MD  Electronically Signed     MVD/MEDQ  D:  05/16/2007  T:  05/16/2007  Job:  [id number] [id number]

## 2010-06-07 NOTE — Procedures (Signed)
BYPASS GRAFT EVALUATION   INDICATION:  Follow-up evaluation of lower extremity bypass graft.   HISTORY:  Diabetes:  Yes.  Cardiac:  Yes.  Hypertension:  Yes.  Smoking:  No.  Previous Surgery:  Right fem-pop bypass graft on 04/11/07 by Dr. Kellie Simmering  with Gore-Tex.   SINGLE LEVEL ARTERIAL EXAM                               RIGHT              LEFT  Brachial:                    160                170  Anterior tibial:             70                 74  Posterior tibial:            128                88  Peroneal:  Ankle/brachial index:        0.75               0.52   PREVIOUS ABI:  Date: 06/28/07  RIGHT:  0.78  LEFT:  0.52   LOWER EXTREMITY BYPASS GRAFT DUPLEX EXAM:   DUPLEX:  Doppler arterial waveforms are biphasic proximal and within the  bypass graft and monophasic distal to the bypass grafting.   IMPRESSION:  1. Ankle brachial indices are stable from previous study bilaterally.  2. Patent femoropopliteal bypass graft.   ___________________________________________  Colin [name], M.D.   MC/MEDQ  D:  11/01/2007  T:  11/01/2007  Job:  [id number]

## 2010-06-07 NOTE — Procedures (Signed)
BYPASS GRAFT EVALUATION   INDICATION:  Followup, right lower extremity bypass graft.   HISTORY:  Diabetes:  Yes.  Cardiac:  Yes.  Hypertension:  Yes.  Smoking:  No.  Previous Surgery:  Redo right fem-pop bypass graft, 04/05/07, by Dr.  Kellie Simmering, with recent amputations on right foot.   SINGLE LEVEL ARTERIAL EXAM                               RIGHT              LEFT  Brachial:                    190                185  Anterior tibial:             150                90  Posterior tibial:            140                100  Peroneal:  Ankle/brachial index:        0.78               0.52   PREVIOUS ABI:  Date: 05/07/07  RIGHT:  0.68  LEFT:  0.54   LOWER EXTREMITY BYPASS GRAFT DUPLEX EXAM:   DUPLEX:  Doppler arterial waveforms appear brisk monophasic, proximal  to, within, and distal to the bypass graft.   IMPRESSION:  1. Patent right femoral-popliteal artery bypass graft.  2. Stable ankle brachial indices bilaterally.      ___________________________________________  Colin [name], M.D.   AS/MEDQ  D:  06/28/2007  T:  06/28/2007  Job:  [id number]

## 2010-06-07 NOTE — Assessment & Plan Note (Signed)
OFFICE VISIT   EBER, HAWK  DOB:  [birth year]                                       05/07/2007  [id number]   Patient is a redo right femoral popliteal bypass with 6 mm Gore-Tex by  me on 03/13 for ischemic changes in the right foot.  He has gangrene of  toes 3 and 4, which was seen by Dr. Sharol Given in the hospital.  The bypass  has functioned well since his discharge, but he does have some edema, 1-  2+, in the right leg.  The incisions have healed nicely, and ABI is  0.68, which is consistent with his one vessel peroneal run-off.   He continues to have gangrene at the distal aspect of toes 3 and 4 and  eventually will require more formal amputation.  We will call today and  have him get the appointment with Dr. Sharol Given for further followup.  We  will continue to follow him on the protocol for his right  femoropopliteal bypass graft.   Nelda Severe Kellie Simmering, M.D.  Electronically Signed   JDL/MEDQ  D:  05/07/2007  T:  05/08/2007  Job:  [id number]

## 2010-06-07 NOTE — H&P (Signed)
NAME:  Colin [name] NO.:  [id number]   MEDICAL RECORD NO.:  [id number]          PATIENT TYPE:  EMS   LOCATION:  [facility]                         FACILITY:  [facility]   PHYSICIAN:  Cherene Altes, M.D.DATE OF BIRTH:  08/18/1928   DATE OF ADMISSION:  02/13/2007  DATE OF DISCHARGE:                              HISTORY & PHYSICAL   PRIMARY CARE PHYSICIAN:  Lynnell Chad. Shelia Media, M.D.   CARDIOLOGIST:  Iran Sizer, M.D.   CHIEF COMPLAINT:  Hyperkalemia and acute renal failure.   HISTORY OF PRESENT ILLNESS:  [name] is a very pleasant 77-  year-old gentleman with an extensive medical history as detailed below.  He has been having difficulty with claudication and was scheduled to  undergo an outpatient peripheral artery catheterization with possible  intervention on February 14, 2007.  Routine preop labs were obtained on  February 12, 2007, at his primary care physician's office.  This morning  those labs returned and revealed a potassium of 6.9 with a creatinine of  3.2.  The patient was called and asked to present immediately to the  emergency room.  In the emergency room, EKG without benefit of old EKGs  for comparison at the present time suggests hyperacute T waves in leads  II, III, aVF and V4.  The patient himself is entirely asymptomatic.  He  denies chest pain, fevers, chills, nausea, vomiting, shortness of  breath.  He states that he has been in his usual state of health with  the exception to claudication symptoms in his right lower extremity.   REVIEW OF SYSTEMS:  Comprehensive review of systems is unremarkable with  the exception of the positive element noted in the history of present  illness above, namely, his claudication symptoms in his right lower  extremity.   PAST MEDICAL HISTORY:  1. Chronic renal insufficiency with baseline creatinine of 1.28 April 2005.  2. Diabetes mellitus type 2 - uncontrolled.  3. Coronary artery disease with  history of MI, status post coronary      artery bypass graft x 4 vessels April 2004.  4. Peripheral vascular disease, status post right fem-pop bypass July      2006 with right great toe amputation.  5. Tachybrady syndrome, status post pacemaker placement April 2007.  6. Cardiomyopathy.  7. Hypertension.  8. Cerebrovascular accident.  9. Hyperlipidemia.  10.Chronic obstructive pulmonary disease with 75-pack-year history of      tobacco abuse.  Discontinued since 2004.  11.Status post appendectomy.  12.Status post cholecystectomy.  13.Status post cataract extraction.  14.Chronic normocytic anemia with hemoglobin baseline anywhere from      9.5 to 11.5 with negative EGD and colonoscopy June 2004.   OUTPATIENT MEDICATIONS:  1. Zetia 10 mg daily.  2. Aspirin 325 mg daily.  3. Detrol LA q.h.s.  4. Lantus 60 __________ q.h.s.  5. Metoprolol XL 25 mg daily.  6. Norvasc 5 mg daily.  7. Ramipril 10 mg daily.  8. Vitamin B12 - dose unclear.   ALLERGIES:  No known drug allergies.   FAMILY HISTORY:  Noncontributory.   SOCIAL HISTORY:  The patient is not presently smoking.  He does not  drink.  He is married and lives in [place] with his wife.   DATA REVIEWED:  Labs from Dr. Pennie Banter office revealed a hemoglobin of  12.8 and otherwise normal CBC.  Sodium 124, potassium 6.9, chloride 95,  bicarb 16, BUN 72, creatinine 3.2, serum glucose 446.  INR is 1.06 with  a PT of 11.5.  A 12-lead EKG suggested hyperacute T waves in II, III,  aVF, V4 and V6.   PHYSICAL EXAMINATION:  VITAL SIGNS:  Temperature 97.6, blood pressure  123/65, heart rate 61, respiratory rate 18, O2 sat 99% on room air.  GENERAL:  Well-developed, well-nourished gentleman in no acute  respiratory distress.  HEENT:  Normocephalic, atraumatic.  Pupils are equal, round and reactive  to light and accommodation.  Extraocular muscles intactbilaterally.  OC/OPclear.  NECK:  No JVD, no lymphadenopathy, no thyromegaly.  LUNGS:   Clear to auscultation bilaterally without wheezes or rhonchi.  CARDIOVASCULAR:  Regular rate and rhythm without murmur, gallop or rub  with normal S1 and S2.  ABDOMEN:  Nontender, nondistended, soft.  Bowel sounds present.  No  hepatosplenomegaly.  No rebound.  No ascites.  EXTREMITIES:  No significant clubbing, cyanosis or edema, bilateral  lower extremities.  There is pain to palpation in the right lower  extremity but no obvious erythema or localized wound.  NEUROLOGIC:  Alert and oriented x 4.  Cranial nerves II-XII are intact  bilaterally.  There is 5/5 strength, bilateral upper and lower  extremities.  Intact sensation to touch throughout.   IMPRESSION/PLAN:  1. Hyperkalemia - the patient is suffering with hyperkalemia.  Vital      signs are stable at present.  There does appear to be a cardiac      effect at the present time.  We will treat the patient acutely with      calcium gluconate, an amp of bicarb and insulin using his elevated      serum glucose to our advantage.  We will then dose the patient with      Kayexalate.  We will follow up BMET later today to ensure that his      potassium is improving.  We will monitor him on telemetry at all      times.  2. Acute renal failure - the patient will be treated with hydration.      We will hold his ACE inhibitor.  Urinalysis will be obtained.  If      the patient's renal function does not improve with hydration we      will consider renal ultrasound.  Bicarbonate fluid will be      administered.  ABGs will be followed.  3. Peripheral vascular disease - Dr. Glade Lloyd has been contacted by      Dr. Pennie Banter office.  At this time, the peripheral procedure will      have to be canceled.  It is quite possible that this could be      accomplished during his hospital stay, however, if the patient      stabilizes and renal function improves.  It may, however, be most      advisable to reschedule this for a later point to avoid contrast       exposure at the present time.  4. Hypertension.  The blood pressure is currently well-controlled.  We      will continue metoprolol and Norvasc but  we will have to hold ACE      inhibitor.  5. Hyperlipidemia.  We will continue Zetia.  6. Coronary artery disease.  The patient is stable from a cardiac      standpoint with the exception of his hyperacute T waves at the      present time.  There is no history to suggest angina.      Cherene Altes, M.D.  Electronically Signed     JTM/MEDQ  D:  02/13/2007  T:  02/13/2007  Job:  [id number]   cc:   Lynnell Chad. Shelia Media, M.D.  Iran Sizer, MD

## 2010-06-07 NOTE — H&P (Signed)
NAME:  Colin [name], Colin [name] NO.:  [id number]   MEDICAL RECORD NO.:  [id number]          PATIENT TYPE:  INP   LOCATION:  [place]                         FACILITY:  [facility]   PHYSICIAN:  Alcide Evener, MD  DATE OF BIRTH:  09/06/1928   DATE OF ADMISSION:  05/12/2008  DATE OF DISCHARGE:                              HISTORY & PHYSICAL   CHIEF COMPLAINT:  Weakness.   Colin [name] is a 77 year old African American gentleman with past medical  history of multiple medical problems including coronary artery disease,  status post coronary artery bypass grafting, peripheral vascular  disease, diabetes mellitus which is poorly controlled, cardiomyopathy,  hypertension, cerebrovascular accident, COPD who states that he had  recently been seeing Colin [name] for possible colonoscopy and was told  to stop his aspirin.  He was concerned that aspirin might be in all of  his medicines so he, therefore, stopped all of his medicines although he  claims he still is taking his insulin.  He had been recently worked up  for worsening renal insufficiency and also had been seen by his primary  care physician today and was found to be hypotensive and hyperglycemic  with a blood sugar in the 500 range.  He is brought to the emergency  department for further evaluation.   In the ED the patient's labs were notable for hyponatremia with sodium  128, potassium 6, bicarb 21, BUN of 64, creatinine 3.2 with a creatinine  obtained last year if about 1.7.  He was started on Glucomander drip  with intravenous fluids and we are asked to admit the patient to the  hospitalist service.  His EKG did not show any peaked T-waves or acute  ST-T wave changes compared to prior EKG.   PAST MEDICAL HISTORY:  1. Coronary disease, status post coronary bypass grafting 2004.  2. Peripheral vascular disease, status post right fem-pop bypass in      2006, right toe amputation.  3. Tachybrady syndrome, status post pacemaker  implantation April 2007.  4. Chronic renal insufficiency, creatinine baseline of 1.6 to 1.8.  5. Cardiomyopathy.  6. Hypertension.  7. Cerebrovascular accident.  8. Hyperlipidemia.  9. COPD with 75 pack year history.  States he has not smoked since he      had his myocardial infarction.  10.Appendectomy.  11.Cholecystectomy.  12.Cataract extraction.  13.Chronic normocytic anemia with negative EGD and colonoscopy in      2004.   FAMILY HISTORY:  Noncontributory.   SOCIAL HISTORY:  The patient does not smoke now.  He does not drink.  He  lives with Colin [name] here in [place].   ALLERGIES:  No known drug allergies.   CURRENT MEDICATIONS:  1. Zetia 10 mg daily.  2. Aspirin 81 mg daily.  3. Humalog 50/50 which he takes 30 in the morning and 20 at night.  4. Ramipril 10 mg daily.  5. Vitamin B12 one tablet daily.   REVIEW OF SYSTEMS:  Review of systems positive for weakness, malaise,  polyuria.  He denies chest pain, denies dyspnea.  He has had a  productive cough  with clear phlegm.  He has not had fevers.  He has not  had nausea, vomiting, blood per rectum, abdominal pain or increased  edema.   PHYSICAL EXAMINATION:  VITAL SIGNS: Bilateral in the emergency  department was in the 121/60 range, up to 139/90, pulse in the 60s.  Breathing 16-18 times a minute.  Pulse oximetry 97-99% on room air.  Temperature max was 97.6.  GENERAL:  Pleasant gentleman, no acute distress.  HEENT:  Normocephalic, atraumatic.  PERRL.  Sclerae anicteric.  Oropharynx clear without erythema or exudate.  CARDIOVASCULAR:  Regular rate and rhythm.  No murmurs, gallops or rubs  heard.  LUNGS:  Diminished breath sounds at the bases, but otherwise clear.  No  expiratory wheezes.  ABDOMEN:  Soft, nondistended, nontender.  EXTREMITIES:  1+ pretibial edema.  Right foot with transmetatarsal  amputation.  Site healed well.  Left foot with onychomycosis of the  toenails.  NEUROLOGIC:  The patient was oriented  to person, place, to month but not  to year or date.  He could not remember the bypass graft of his heart or  his leg and could not remember his cholecystectomy.  Otherwise he had a  nonfocal neurological examination.   LABORATORY DATA:  An EKG shows pacing via atrial pacemaker, left  ventricular hypertrophy, ST segment and T wave abnormalities associated  with this but no changes compared to EKG done May 14, 2007.  Repeat  potassium was 4.8. Urine 0-2 rbc's, few bacteria.  Urinalysis showed  specific gravity 1.022, pH 5, glucose 1000,  1000, bilirubin small,  trace blood, negative for nitrites and leukocyte esterase.  BMP showed  sodium 128, potassium 6, chloride 113, bicarb 21, glucose 387, BUN 64,  creatinine 3.2.  Bilirubin 1.5, alk phos 105, AST 51, ALT 45, albumin  3.3.  CBC with differential:  White count 6.8, hemoglobin 12, platelets  207,000, ANC 4.5.   ASSESSMENT/PLAN:  This is a 77 year old African American gentleman with  multiple medical problems including coronary artery disease, coronary  artery bypass grafting, hyperlipidemia, hypertension, poorly controlled  diabetes mellitus.  He has stopped most of his medications at home  although he claims he has been taking his insulin.  He presents with  worsening renal insufficiency, polyuria and hyperglycemia without  ketosis or metabolic acidosis and renal failure.  1. Hyperglycemia.  The patient has been placed on a Glucomander with      normal saline.  We will continue this and when his blood sugar is      normalized, start him back on home regimen of NovoLog.  2. Acute renal insufficiency.  Likely prerenal potentially due to      poorly controlled diabetes and polyuria.  As mentioned, will give      him normal saline at 200 an hour and vigorously hydrate him and      take caution given his history of heart failure.  3. Coronary artery disease.  We are going to hold his ACE inhibitor      and also hold his beta blocker and  calcium channel blocker for the      time being.  As his blood pressure is improved, I will add back his      beta blocker.  His aspirin was being held for colonoscopy.  Will      continue to hold this.  We will cycle his cardiac enzymes and we      will monitor him on telemetry.  4. History of congestive  heart failure.  Again we will monitor his      fluid status and monitor for potential fluid overload.  5. Query dementia.  As per the patient's [name], she believes that he is      a little bit more confused than baseline due to his being sick and      having problems with his blood sugars.   Prophylaxis: The patient will be in SCDs.      Alcide Evener, MD  Electronically Signed     CV/MEDQ  D:  05/12/2008  T:  05/13/2008  Job:  [id number]   cc:   Lynnell Chad. Shelia Media, M.D.  Fax: [phone]

## 2010-06-10 NOTE — H&P (Signed)
NAME:  TURAN, QUINTILIANI NO.:  [id number]   MEDICAL RECORD NO.:  [id number]          PATIENT TYPE:  INP   LOCATION:  5008                         FACILITY:  [facility]   PHYSICIAN:  Rosetta Posner, M.D.    DATE OF BIRTH:  [birth year]   DATE OF ADMISSION:  05/17/2004  DATE OF DISCHARGE:                                HISTORY & PHYSICAL   CHIEF COMPLAINT:  Swelling and redness of the right foot.   HISTORY OF PRESENT ILLNESS:  The patient is a 77 year old black male with  the history of right great toe ulceration which has been present for about 6  months now. He has been followed by the foot clinic and has been treated  with topical ointments and local wound care.  The area has not healed and  Dr. Donnetta Hutching saw the patient in January of 2006 and, at that time, he was noted  to have decrease ankle-brachial indices bilaterally at 0.45 on the left and  0.49 on the right.  He also complained of some mild nonlimiting claudication  symptoms.  Dr. Donnetta Hutching recommended follow up in 1 month and proceeding with  aortography if he had had no improvement of his symptoms.  The patient  apparently did not follow up as scheduled.  Over the past week he has had  increasing redness of his great toe area extending up to his forefoot with  swelling.  He has also noted some foul smelling drainage from the ulcerated  area.  He is seen by his physician today and was referred to the emergency  department for further evaluation.  Dr. Donnetta Hutching saw the patient in the ER and  noted right foot cellulitis. The patient is to be admitted at this time for  IV antibiotic therapy.  Of note, he continues to have mild lower extremity  claudication symptoms bilaterally.  He denies any rest pain, hip, thigh or  buttock pain.  He has not run fevers or had chills at home. He has had no  specific temperature changes of ishemic changes in his feet.   PAST MEDICAL HISTORY:  1.  Coronary artery disease.  2.  History of  myocardial infarction.  3.  Type 2 insulin-dependent diabetes mellitus.  4.  Hypertension.  5.  Hypercholesterolemia.  6.  Postoperative atrial fibrillation following coronary artery bypass      surgery.  7.  History of mild renal insufficiency.  8.  Chronic obstructive pulmonary disease.  9.  Peripheral vascular disease.   PAST SURGICAL HISTORY:  1.  Coronary artery bypass grafting in April 2004 by Dr. Tharon Aquas Trigt.  2.  Appendectomy.  3.  Cholecystectomy.  4.  Cataract removal.   CURRENT MEDICATIONS:  1.  Enteric coated aspirin 325 mg daily.  2.  Norvasc 2.5 mg daily.  3.  Diovan 320 mg daily.  4.  Lantus 60 units q.h.s.  5.  Humalog 5 units q.a.m. with breakfast 20 units with lunch and 25 units      with dinner.  6.  Vitamin B12 daily.   ALLERGIES:  No known drug allergies.  SOCIAL HISTORY:  He is married and resides in [place] with his wife.  They have 3 sons. He previously smoked 1-1/2 pack of cigarettes per day x50  plus years.  He quit 2 years ago at the time of his heart surgery.  He also  drank alcohol rarely in the past and has had no alcohol in several years.   FAMILY HISTORY:  His mother has diabetes mellitus.  His father died of a  myocardial infarction.  He has 3 sisters who are all alive and well with no  chronic medical problems.   REVIEW OF SYSTEMS:  See history of present illness for pertinent positives  and negatives.  Also, he complains of occasional chest pain. Apparently he  was recently seen by his family physician for this and his workup was  negative for any acute cardiac events. He also has dyspnea on exertion. He  reports nocturia 2-3 times per night, as well as polyuria during the  daytime. He has had some problems with blood in his stool in the past, and  has undergone colonoscopy which was negative.  He has had none recently. He  wears dentures. He denies fevers, chills, weight loss, recent infections,  TIA symptoms, amaurosis fugax,  visual changes, dysphagia, speech impairment,  weakness, heart palpitations, orthopnea, paroxysmal nocturnal dyspnea,  wheezing, abdominal pain, nausea, vomiting, diarrhea, constipation, reflux  symptoms, hemoptysis, hematuria, hematochezia or melena, muscle or joint  problems, depression, anxiety, or other psychiatric problem.   PHYSICAL EXAMINATION:  VITAL SIGNS:  Blood pressure is 161/80, heart rate is  86 and regular, respirations 20 and unlabored.  Temperature 98.7.  GENERAL:  This is a well-developed, well-nourished, black male in no acute  distress.  He appears younger than his stated age.  HEENT:  Normocephalic, atraumatic.  Pupils equal, round, and react to light  and accommodation.  Extraocular movements intact.  Exam of the external ears  and nose revealed no abnormalities.  Oropharynx is clear with upper dentures  in place and edentulous lower.  NECK:  Supple without lymphadenopathy, thyromegaly or carotid bruits.  HEART:  Regular rate and rhythm with soft 2/6 systolic murmur heard best at  the left upper sternal border.  He has a well-healed median sternotomy scar.  LUNGS:  Clear to auscultation.  ABDOMEN:  Soft, nontender, and nondistended with active bowel sounds in all  quadrants.  No masses or hepatosplenomegaly.  He does have a small umbilical  hernia.  GENITAL AND RECTAL EXAMS:  Deferred  EXTREMITIES:  He has well-healed, saphenectomy scars in a bridge pattern  from his right thigh to just below his right knee as well as a small scar at  his left knee.  He has erythema and warmth of his right forefoot extending  around to his right great toe.  This area is also quite edematous.  The  right great toe has a lateral ulceration extending onto the plantar aspect  of his foot with a small amount of serous drainage and a slight odor.  His  feet are both cool to touch.  He has a 2+ right femoral and a 1+ left femoral pulse with monophasic Doppler signals in his dorsalis pedis  and  posterior tibialis bilaterally.  NEUROLOGIC:  Cranial nerves II-XII grossly intact.  He is alert and oriented  x3.  His gait is not assessed.  He has symmetrical strength of his upper and  lower extremities.   ASSESSMENT AND PLAN:  This is a 77 year old male with a  history of  peripheral vascular disease and claudication who presents with a nonhealing  right great toe ulcer as well as right foot cellulitis.  He will be admitted  by Dr. Donnetta Hutching and will be started on IV antibiotics and hydration with an eye  towards an arteriogram in the morning on May 18, 2004.      GC/MEDQ  D:  05/17/2004  T:  05/17/2004  Job:  [id number]   cc:   Dr. Delmar Landau   Iran Sizer, MD  [address] [place]  [place] [address]  Fax: [phone]

## 2010-06-10 NOTE — Op Note (Signed)
NAME:  Colin [name], Colin [name] NO.:  [id number]   MEDICAL RECORD NO.:  [id number]                   PATIENT TYPE:  INP   LOCATION:  2314                                 FACILITY:  [facility]   PHYSICIAN:  Ivin Poot III, M.D.           DATE OF BIRTH:  [birth year]   DATE OF PROCEDURE:  05/06/2002  DATE OF DISCHARGE:                                 OPERATIVE REPORT   OPERATION:  1. Coronary artery bypass grafting x4;     a. Left internal mammary to left anterior descending.     b. Saphenous vein graft to diagonal.     c. Saphenous vein graft to OM1.     d. Saphenous vein graft to right coronary artery.  2. Excision of anterior mediastinal cystic mass.   PREOPERATIVE DIAGNOSES:  Unstable angina with left main stenosis and three-  vessel coronary artery disease.   POSTOPERATIVE DIAGNOSES:  Unstable angina with left main stenosis and three-  vessel coronary artery disease.   SURGEON:  Ivin Poot, M.D.   ASSISTANT:  Suzzanne Cloud, P.A.   ANESTHESIA:  General.   INDICATIONS:  The patient is a 77 year old black male who presented with  symptoms of unstable angina.  Cardiac catheterization by Minette Brine. Tysinger,  M.D. demonstrated significant left main stenosis with three-vessel disease  and moderate reduction in LV function.  He is felt to be a candidate for  surgical coronary revascularization.  Prior to surgery I examined the  patient in his hospital room and read the results of the cardiac  catheterization with the patient.  I discussed the indications and expected  benefits of coronary artery bypass surgery for treatment of his coronary  artery disease, as well as the alternatives to surgical therapy for  treatment of his coronary artery disease.  I discussed the major aspects of  the proposed operation with the patient, including the location of the  surgical incisions, the choice of conduit for grafting, the use of general  anesthesia and  cardiopulmonary bypass, and the expected postoperative  hospital recovery.  I reviewed with the patient the risks to and of coronary  bypass surgery, including the risks of MI, CVA, bleeding, infection and  death.  He understood these implications for the surgery and agreed to  proceed with the operation as planned, under what I felt was an informed  consent.   PROCEDURE:  The patient was brought to the operating room and placed supine  on the operating room table, where general anesthesia was induced under  invasive hemodynamic monitoring.  The chest, abdomen and legs were prepped  with Betadine and draped as a sterile field.  A sterile incision was made as  the saphenous vein was harvested from the right thigh using the open  technique.  The left internal mammary was harvested by pedicle graft from  its origin at the subclavian vessels.   Prior to opening  the pericardium the mediastinum was examined and there was  a 3 x 3 cm cystic mass in the superior anterior mediastinum in the area of  the thymic tissue.  This cystic mass was excised and submitted to pathology.  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   The pericardium was opened.  Pursestrings were placed and the ascending  aorta and right atrium were cannulated.  The patient was placed on bypass  after documenting the ACT was therapeutic.  The coronaries were identified  for grafting and the mammary artery and vein grafts were prepared for the  distal anastomoses.  Cardioplegia cannula was replaced with both antegrade  and retrograde delivery of cold blood cardioplegia in the ascending aorta  and coronary sinus.  The patient was then cooled to 30 degrees and the  aortic crossclamp was applied.  A total of 700 cc of cold blood cardioplegia  was delivered in split doses between the antegrade and retrograde  cardioplegia catheters.  There was cardioplegic arrest with septal  temperature drop less than 15 degrees.   Topical iced saline slush was used  to augment myocardial preservation and a pericardial insulated pad was used  to protect the left phrenic nerve.   The distal coronary anastomoses were then performed.  The first distal  anastomosis was to the distal right.  This was a 1.5 mm tortuous vessel,  with a proximal 90% stenosis.  A reverse saphenous vein was sewn end-to-side  with running 7-0 Prolene.  A second distal anastomosis was to the diagonal  branch of the LAD.  This was a 1.5 mm vessel, proximal 95% stenosis.  A  reverse saphenous vein was sewn end-to-side with running 7-0 Prolene.  There  was good flow to the graft.  Cardioplegia was redosed.  The third distal  anastomosis was to the OM1.  This was an intramyocardial vessel, 1.6 to 1.7  mm vessel with a proximal 80% left main stenosis.  A reverse saphenous vein  was sewn end-to-side with running 7-0 Prolene.  A fourth distal anastomosis  was placed to the mid LAD, which is 1.75 mm vessel with proximal 90%  stenosis.  The left internal mammary artery pedicle is brought through an  opening created in the left lateral pericardium; was brought down onto the  LAD and sewn end-to-side with running 8-0 Prolene.  There was good flow  through the anastomosis, with immediate rise in septal temperature after  briefly flashing the pedicle clamp on the mammary artery.  The pedicle clamp  was reapplied, and the pedicle was secured to the epicardium.  Cardioplegia  was redosed.  The proximal anastomoses were then placed under the crossclamp  due to disease of his ascending aorta, with thickening and some  calcification.  Two 4 mm punch holes were made in the ascending aorta, and  the proximal anastomoses for the circumflex and right coronary artery were  placed with a running 6-0 Prolene.  Air was vented from the left side of the  heart before removing the partial clamp.  The proximal anastomosis to the diagonal vein graft was placed to the hood of  the circumflex vein, due to  the small size of the diagonal vein; which was too small to sew directly to  the thickened ascending aorta.   The vein grafts were fused after the proximal anastomoses were established.  There was a good flow in the grafts and hemostasis was documented to the  proximal and distal  anastomoses.  The patient was rewarmed and reperfused.  Temporary pacing wires were applied.  The lungs were expanded and the  patient was then weaned from bypass on mechanical ventilation and low dose  Dopamine.  Hemodynamics and cardiac output were stable.  Protamine was  administered and there was no adverse reaction to the Protamine.  The  cannulae were removed and the mediastinum was irrigated with warm antibiotic  irrigation.  The leg incisions were irrigated and closed in a standard  fashion.  The pericardium was easily reapproximated.  Repair to the  mediastinal and the left pleural chest tube were placed.  The sternum was  reapproximated with interrupted wire.  The pectoralis fascia was closed with  a running Vicryl.  The subcutaneous and skin were closed in a running  fashion, and sterile dressings were applied.   TOTAL BYPASS TIME:  140 min.   CROSSCLAMP TIME:  80 min (to perform the distal and proximal coronary  anastomosis under a single crossclamp).                                               Len Childs, M.D.    PV/MEDQ  D:  05/07/2002  T:  05/07/2002  Job:  [id number]

## 2010-06-10 NOTE — Discharge Summary (Signed)
NAME:  Colin [name], Colin [name] NO.:  [id number]   MEDICAL RECORD NO.:  [id number]          PATIENT TYPE:  INP   LOCATION:  2016                         FACILITY:  [facility]   PHYSICIAN:  Colin [name], M.D.    DATE OF BIRTH:  Oct 20, 1928   DATE OF ADMISSION:  05/17/2004  DATE OF DISCHARGE:  05/27/2004                                 DISCHARGE SUMMARY   PRIMARY DIAGNOSIS:  Right foot ischemia, great toe gangrene.   SECONDARY DIAGNOSES:  1.  Coronary artery disease.  2.  History of myocardial infarction.  3.  Diabetes mellitus type 2.  4.  Hypertension.  5.  Hyperlipidemia.  6.  Postoperative atrial fibrillation following coronary artery bypass graft      surgery.  7.  History of mild renal insufficiency.  8.  Chronic obstructive pulmonary disease.  9.  Peripheral vascular disease.   ALLERGIES:  No known drug allergies.   IN HOSPITAL OPERATIONS/PROCEDURES:  1.  Right leg arteriogram.  2.  Right FEM-POP bypass graft with 6 mm __________  Gore-Tex.  3.  Right great toe amputation.   HISTORY OF PRESENT ILLNESS:  Colin [name] is a 77 year old black male with  history of right great toe ulceration which has been present for about six  months now.  He has been followed by the foot clinic and has been treated  with topical ointments and local wound care.  The area has not healed and  Colin [name] saw the patient in January in 2006 at which time he was noted to  have decreasing ankle brachial indices bilaterally at 0.45 on the left and  0.49 on the right.  He had some complaint of some mild non-limiting  claudication symptoms.  Colin [name] recommended follow up in one month and  proceeding with aortography if he had no improvement of his symptoms.  The  patient apparently did not follow up as scheduled.  Over the past week he  has had increasing redness of his great toe area extending up to his  forefoot with swelling.  He has also noted some foul-smelling drainage from  the  ulcerated area.  He was seen by his physician on the day of admission  and referred to the emergency department for further evaluation.  Colin [name]  saw the patient in the emergency room and noted right foot cellulitis.  The  patient was admitted at this time for intravenous antibiotic therapy.  He  was noted to have some mild lower extremity claudication sometimes  bilaterally. He denies any rest pain, hip, thigh or buttocks pain.  He has  not run fevers or chills.  He has had no specific temperature changes or  ischemic changes in his foot.  For details of the patient's past medical  history and physical examination please see the dictated history and  physical.   HOSPITAL COURSE:  Colin [name] was admitted to [facility] on May 17, 2004.  The patient was started on intravenous antibiotics and hydration.  He was  scheduled to undergo an arteriogram for May 18, 2004.  Colin [name] performed  the above-mentioned procedure and noted severe stenosis in the superficial  femoral artery with single vessel run off through the peroneal.  Colin [name]  discussed with patient at that time proceeding with a right femoral to  popliteal bypass graft.  He discussed the risks and benefits of this  procedure.  The patient acknowledged understanding and wished to proceed.  The surgery was scheduled for May 20, 2004.  The patient remained on  intravenous antibiotics prior to this. On May 20, 2004 the patient  underwent right femoral to popliteal bypass grafting as well as open right  great toe amputation.  The patient tolerated these procedures well and was  transferred to the PACU in stable condition.  In PACU noted dopplerable  signals in DP.  On postoperative day #1 the patient was stable.  Dressings  were dry.  His foot wound was clean.  He had biphasic DP, PT and peroneal  signals noted.  The patient was transferred to 2000.  Postoperative day #2  the patient continued to heal.  Right foot was warm.   Right great toe  amputation site had primarily pink tissue.  Incisions were dry and intact.  Graft was noted to be patent.  On postoperative day #3 the patient's vital  signs were stable and he was afebrile.  Graft remained patent.  Positive  dopplerable signals.  Incisions were healing well.  Toe site had mostly  granulation tissue.  The patient was scheduled to be taken to the operating  room on May 24, 2004 for debridement and closure of the right great toe  amputation site.  This took place on May 24, 2004.  The patient tolerated  this procedure well.  On May 25, 2004 the patient had a low grade temperature  of 100.2.  incisions were dry, intact and healing well.  No signs of  infection.  Extremities were well perfused.  The patient was to be out of  bed and ambulating.  On May 26, 2004 the patient continued to improve.  Incisions remained dry and intact.  The patient was out of bed ambulating  slightly.  Case management was consulted for discharge planning.  Home  health nurse and physical therapy were arranged.  By May 27, 2004 the patient  was discharged home in stable condition.  All incisions were dry, intact and  healing well.  The graft was patent.  A follow up appointment was scheduled  for Colin [name] for two weeks.  Office will arrange this appointment.  The  patient is to contact Colin [name] to arrange a follow up appointment in  the next two to three weeks.  Noted patient was started on Toprol XL.  Home  health, physical therapy and nurse were arranged.  Colin [name] received  instructions on diet, activity level and incisional care.  He was told to  continue walking exercises.  He is to be on a low fat, low salt diet.  He is  educated on how to clean his wound site using soap and water.  The patient  acknowledges understanding.   DISCHARGE MEDICATIONS:  1.  Enteric coated aspirin 325 mg p.o. daily.  2.  Norvasc 2.5 mg p.o. daily.  3.  Diovan 325 mg p.o. daily. 4.  Lantus 6  units q.h.s.  5.  Humalog 5 units q.a.m. with breakfast, 20 units with lunch and 25 units      with dinner.  6.  Vitamin B12 daily.  7.  Keflex 500 mg q.8h.  X14 days.  8.  Toprol XL 25 mg p.o. daily.  9.  Tylox one to two tablets p.o. q.4-6h. PRN pain.       KMD/MEDQ  D:  07/27/2004  T:  07/27/2004  Job:  [id number]

## 2010-06-10 NOTE — H&P (Signed)
NAME:  Colin [name], Colin [name]                         ACCOUNT NO.:  [id number]   MEDICAL RECORD NO.:  [id number]                   PATIENT TYPE:  INP   LOCATION:  0155                                 FACILITY:  [facility]   PHYSICIAN:  Merrilee Seashore, M.D.            DATE OF BIRTH:  1928/04/09   DATE OF ADMISSION:  04/26/2002  DATE OF DISCHARGE:                                HISTORY & PHYSICAL   CHIEF COMPLAINT:  Sudden onset of shortness of breath.   HISTORY OF PRESENT ILLNESS:  The patient had been in his usual state of  health, had felt no problems until approximately 7:00 p.m. this evening when  he noticed the development of some shortness of breath.  The symptoms seemed  to progress and increased markedly at around midnight.  As a result of this,  he decided to summon EMS.  He also nonchalantly admits to some right-sided  chest discomfort which he states did not radiate.  He describes it in an  area just medial to his right nipple and approximately 3 cm in diameter.  He  thought that maybe there was some tenderness at the time that it was  present, however, the pain is presently gone.  He is unclear whether this  resolved with the administration of nitroglycerin or oxygen or just resolved  on its own.  He denies any palpitations.  There is no nausea, there is no  vomiting, there is no fever, there are no chills.   PAST MEDICAL HISTORY:  1. Insulin-dependent diabetes mellitus.  2. Hypercholesterolemia.  3. Hypertension.  4. Smoker.   MEDICATIONS:  1. Lipitor 40 mg p.o. daily.  2. Aspirin one tablet p.o. daily.  3. Atenolol 50 mg p.o. daily.  4. Amaryl 4 mg p.o. daily.  5. Insulin 35 units of Lantus q.h.s.   SOCIAL HISTORY:  He smokes approximately one pack per day, does not abuse  alcohol.  He lives at home with his wife and two children.   FAMILY HISTORY:  Noncontributory.   REVIEW OF SYMPTOMS:  All systems reviewed, pertinent positives noted above.   PHYSICAL  EXAMINATION:  VITAL SIGNS:  On admission his blood pressure was  179/90, this reduced to 137/72 with the administration of medications.  His  pulse rate was found to be 62.  He was afebrile.  His pulse oximetry on  admission was 66, but improved to upper 90's with administration of 100% non-  rebreather and Lasix.  HEENT:  Pupils equal, round, reactive to light and  accommodation.  Extraocular movements were intact.  Mucosa are dry.  NECK:  No evidence of JVD or bruit.  CHEST:  There is diffuse wheeze noted  throughout the chest, there is decreased air movement throughout, there is  some faint crackles and rhonchi heard towards the bases.  Overall, this  greatly improved after administration of Lasix.  CARDIAC:  S1 and S2  normally heard.  There are no murmurs, rubs, or gallops.  ABDOMEN:  Benign.  EXTREMITIES:  There is trace pedal edema about his ankles.  NEUROLOGIC:  The  patient is fluent, has normal gait.   LABORATORY DATA:  His cardiac panel is presently pending, as is his CMP.  The only laboratory study back is CBC which reveals no significant  abnormality.  His chest x-ray reveals severe congestive heart failure at the  time of admission.   ASSESSMENT AND PLAN:  1. Congestive heart failure.  The patient had an excellent response to 40 mg     of Lasix in the ER with approximately 600 cc of fluid diuresed.  We will     continue this diuresis.  We will continue support using O2.  We will need     to further identify cause.  2. Chest pain.  Possibly cardiac in nature.  The patient did have relief     possibly with administration of medications in the emergency room.  We     will need to check cardiac enzymes and troponin, and follow.  We will     need cardiac consultation if the cardiac enzymes are positive.  3. Respiratory failure.  Most likely secondary to congestive heart failure     with a mild component of chronic obstructive pulmonary disease.  We will     initiate treatment  of congestive heart failure as outlined above.  We     will also need to rule out for myocardial infarction.  4. Insulin-dependent diabetes mellitus.  We will continue present treatment.     I initiated some sliding scale, and follow closely.                                               Merrilee Seashore, M.D.    AR/MEDQ  D:  04/26/2002  T:  04/26/2002  Job:  [id number]

## 2010-06-10 NOTE — Op Note (Signed)
NAME:  Colin, [name] NO.:  [id number]   MEDICAL RECORD NO.:  [id number]          PATIENT TYPE:  INP   LOCATION:  5008                         FACILITY:  [facility]   PHYSICIAN:  Rosetta Posner, M.D.    DATE OF BIRTH:  23-Aug-1928   DATE OF PROCEDURE:  05/19/2004  DATE OF DISCHARGE:                                 OPERATIVE REPORT   PREOPERATIVE DIAGNOSIS:  Right foot ischemia.   POSTOPERATIVE DIAGNOSIS:  Right foot ischemia.   OPERATION PERFORMED:  Right leg arteriogram.   SURGEON:  Rosetta Posner, M.D.   ANESTHESIA:  1% lidocaine local.   COMPLICATIONS:  None.   DISPOSITION:  Holding area stable.   INDICATIONS FOR PROCEDURE:  The patient is a 77 year old gentleman with a  nonhealing right great toe ulceration with cellulitis.  He has known aortic  insufficiency.  He is taken to the peripheral vascular cath lab for  angiography at this time. His creatinine is elevated at 1.8 and therefore  right leg angio only was obtained.   DESCRIPTION OF PROCEDURE:  Using local anesthesia and a single wall  puncture, the left common femoral artery was entered.  A guidewire was  passed up the level of the suprarenal aorta.  A 5 French sheath was passed  over the guidewire.  The IMA catheter was then positioned over the guidewire  and was used to selectively catheterize the right common iliac artery.  Bolus chase technique was used for arteriography.  The external and common  femoral artery were widely patent.  There was a widely patent profunda  femoris artery.  There was patency of the proximal superficial femoral  artery.  There was subtotal occlusion of the superficial femoral artery at  several places in the upper thigh, midthigh and then there was a significant  stenosis in the popliteal artery at the above knee position.  The at-knee  popliteal artery was of good caliber.  The anterior tibial and posterior  tibial arteries were completely occluded at their origin.  The  patient did  have a patent peroneal artery that was patent throughout its course and  reconstituted dorsalis pedis at the level of the ankle.  The patient  tolerated the procedure without immediate complication and was transferred  to the holding area in stable condition.   FINDINGS:  1.  Patent iliac system on the right.  2.  Severe stenosis in the superficial femoral artery with single vessel      runoff via the peroneal artery.      TFE/MEDQ  D:  05/19/2004  T:  05/19/2004  Job:  [id number]

## 2010-06-10 NOTE — Op Note (Signed)
NAME:  Colin [name], Colin [name] NO.:  [id number]   MEDICAL RECORD NO.:  [id number]          PATIENT TYPE:  INP   LOCATION:  2016                         FACILITY:  [facility]   PHYSICIAN:  Rosetta Posner, M.D.    DATE OF BIRTH:  [birth year]   DATE OF PROCEDURE:  05/20/2004  DATE OF DISCHARGE:                                 OPERATIVE REPORT   PREOPERATIVE DIAGNOSIS:  Right foot ischemia with gangrene right great toe.   POSTOPERATIVE DIAGNOSIS:  Right foot ischemia with gangrene right great toe.   PROCEDURES:  1.  Right femoral to above knee popliteal bypass with 6 mm Gore-Tex graft.  2.  Right open ray amputation of right great toe.   SURGEON:  Rosetta Posner, M.D.   ASSISTANT:  John Giovanni, P.A.-C.   ANESTHESIA:  General endotracheal anesthesia.   COMPLICATIONS:  None.   DISPOSITION:  To recovery room stable.   DESCRIPTION OF PROCEDURE:  Patient was taken to the operating room and  placed in position where the area of the right groin and right leg were  prepped and draped in the usual sterile fashion.  Incision was made over the  femoral pulse, carried down to isolate the common superficial femoral and  profunda femoris arteries.  Patient had a prior saphenous vein harvest for  coronary bypass.  Next, a separate incision was made at the medial approach  of the above knee popliteal artery and the artery was exposed to the near  mid popliteal level behind the knee.  Patient had extensive thickening but a  patent artery at this point.  The patient had an arteriogram revealing  stenosis at this popliteal level and this area was identified.  Tunnel was  created from this area to the groin.  A 6 mm thin wall stretch intraring  Gore-Tex graft was brought onto the field.  The patient was given 7000 units  of intravenous heparin.  After adequate circulation time, the common  superficial femoral and profundis femoris arteries were occluded.  The  common femoral artery was  opened with 11 blade and extended with  __________Potts scissors.  The graft was slightly spatulated and sewn end-to-  side to the artery with a running 6-0 Prolene suture.  Clamps removed from  the artery and was replaced on the graft.  Next, pneumatic tourniquet was  placed on the mid upper thigh.  The leg was elevated and exsanguinated and  the pneumatic tourniquet was inflated.  The popliteal artery was opened at  the knee at the level of stenosis and this area was slightly  endarterectomized.  The artery was widely patent below this with the #4  dilator passing easily through this.  The graft was cut to the appropriate  length, was spatulated and sewn end-to-side to the artery with a running 6-0  Prolene suture.  The pneumatic tourniquet was deflated and the usual  flushing maneuvers undertaken and the anastomosis was then completed.  Excellent Doppler flow was noted in the foot.  The patient was given 50 mg  of protamine to reverse the heparin.  The wounds were irrigated with saline,  hemostasis with electrocautery.  Wounds were closed with 2-0 Vicryl in the  skin was closed with a 3-0 subcuticular Vicryl stitch.  Sterile dressing was  applied.   Next, the right foot was prepped and draped.  The initial incision was made  at the base of the great toe and this showed that there was a great deal of  pus in the joint space between the metatarsal head and the bone of the great  toe. For this reason, the metatarsal head was resected and open ray  amputation was undertaken.  The wound was irrigated with saline.  Hemostasis  with electrocautery.  Wounds were irrigated with saline. Culture for both  aerobic and anaerobic culture was sent.  The wounds were irrigated with  saline and were packed with Betadine soaked 4x4.  Patient was transferred to  the recovery room in stable condition.      TFE/MEDQ  D:  05/23/2004  T:  05/23/2004  Job:  [id number]

## 2010-06-10 NOTE — Discharge Summary (Signed)
NAME:  Colin [name], Colin [name] NO.:  [id number]   MEDICAL RECORD NO.:  [id number]          PATIENT TYPE:  INP   LOCATION:  [id number]                         FACILITY:  [facility]   PHYSICIAN:  Rexene Alberts, M.D.    DATE OF BIRTH:  Jun 06, 1928   DATE OF ADMISSION:  05/12/2005  DATE OF DISCHARGE:  05/18/2005                                 DISCHARGE SUMMARY   DISCHARGE DIAGNOSES:  1.  Type 2 diabetes mellitus with an admitting diagnosis of hyperglycemia.      The patient's venous glucose was 697 on admission.  His hemoglobin A1c      is 16.2.  2.  One episode of asymptomatic hypoglycemia with a capillary blood sugar of      46.  3.  Tachy-brady syndrome, status post permanent pacemaker implantation (DDD      mode) per Dr. Roe Rutherford, April25,2007.  4.  Cardiomyopathy  5.  Transient atypical chest pain.  6.  Urinary incontinence, possibly secondary to an urinary tract infection.  7.  Hypertension.  8.  Chronic kidney disease.   For the secondary discharge diagnoses and past medical history, please see  the dictated history and physical.   DISCHARGE MEDICATIONS:  1.  Levaquin 500 mg daily for 7 more days.  2.  Toprol-XL 25 mg daily.  3.  Norvasc 5 mg daily.  4.  Altace 2.5 mg daily.  5.  Aspirin 325 mg daily.  6.  Zetia 10 mg nightly.  7.  Detrol LA 2 mg at bedtime.  8.  Lancets insulin 45 units at bedtime.  9.  NovoLog insulin 5 units before each meal and at bedtime.  10. Multivitamin with iron.   DISCHARGE DISPOSITION:  The patient was discharged to home in improved and  stable condition on April26,2007.  He was advised to follow up with  cardiologist, Dr. Glade Lloyd, in 1 week.  He was also advised to follow up  with his primary care physician, Dr. Deland Pretty, in 1 week as well.   CONSULTATIONS:  Dr. Roe Rutherford.   PROCEDURE PERFORMED:  1.  Implantation of a permanent pacemaker (DDD mode) per Dr. Glade Lloyd on      [id number].  2.  Two-dimensional  echocardiogram on April24,2007.  The results revealed      overall left ventricular systolic function was moderately decreased with      an ejection fraction ranging from 30% to 35%.  Severe hypokinesis of the      mid-distal septal wall.  Doppler parameters were consistent with both      elevated ventricular end-diastolic filling pressure and elevated left      atrial filling pressure.  Interpretation read by Dr. Shelva Majestic.  3.  Per Dr. Jenny Reichmann Tysinger's interpretation, the 2-D echocardiogram revealed      a normal left ventricular function with an ejection fraction estimated      at 50% to 60%.   HISTORY OF PRESENT ILLNESS:  The patient is a 77 year old man with a past  medical history significant for type 2 diabetes mellitus, coronary artery  disease, status post CABG, peripheral  vascular disease, and COPD, who  presented to the emergency department on April20,2007 with a chief complaint  of dizziness.  When the patient was evaluated in the emergency department,  his venous glucose was found to be quite elevated at 697.  The patient was  therefore admitted for further evaluation and management.  For additional  details, please see the excellent history and physical dictated by Dr.  Joette Catching.   HOSPITAL COURSE:  PROBLEM #1 -  TYPE 2, INSULIN-REQUIRING DIABETES MELLITUS,  UNCONTROLLED:  During the initial management in the emergency department,  the patient was started on the Glucommander protocol.  His serum sodium was  quite low at 124 and therefore the patient was started on volume repletion  with normal saline.  The hyponatremia was felt to be secondary to the severe  hyperglycemia.  Over the course of the first 24 hours, the insulin drip was  discontinued and the patient was transitioned to Lantus 20 units subcu  nightly and a sliding-scale insulin regimen with NovoLog q.a.c. and nightly.  Over the course of the hospitalization, the Lantus was titrated up to 50  units subcu  nightly.  His capillary blood sugars improved progressively.  However, this morning, his capillary blood sugar fell to 46.  The patient  was asymptomatic at that time.  He was treated appropriately and following  treatment with dietary intervention, his capillary blood sugar improved to  105.  In light of the recent hypoglycemia, the patient will be advised to  decrease the Lantus to 45 units subcu nightly.  He was advised to continue  the sliding-scale insulin regimen at home, however, start with 5 units of  NovoLog q.a.c. and nightly.  The patient was also advised to increase the  Lantus by 5 units each week if his blood sugars remained above  200  consistently.  He was provided with a review of blood glucose monitoring at  home.  He was strongly encouraged to be more compliant with his dietary  regimen and insulin therapy.  Of note, his hemoglobin A1c was 16.2 during  the hospital course.  Further management per his primary care physician Dr.  Shelia Media.   PROBLEM #2 - TACHYBRADY SYNDROME:  The patient has a significant history of  coronary artery disease and peripheral vascular disease.  He also has a  history of paroxysmal atrial fibrillation immediately following the CABG  surgery.  During the hospital course, he developed a short run of  nonsustained ventricular tachycardia.  He was asymptomatic at that time.  Following the tachycardia, the patient became bradycardic with his heart  rate falling into the 30s.  He was mildly symptomatic.  In addition, the  patient had 1 episode of mild left-sided chest pain, although it appeared to  be atypical for angina.  Given these findings, a 2-D echocardiogram and  cardiac enzymes were ordered.  The 2-D echocardiogram as read by Dr.  Glade Lloyd revealed a normal ejection fraction ranging from 50% to 60% and  global hypokinesis.  However, per Dr. Evette Georges interpretation on the official reading, his ejection fraction was estimated at 30% to 35%.  The  patient's  cardiac enzymes were only marginally elevated.  His EKG was nonacute.  Per  Dr. Madelyn Brunner assessment, his chest pain was not consistent with ischemia.  Given the mildly symptomatic bradycardia, Dr. Glade Lloyd decided to implant a  permanent pacemaker.  The implantation was performed on April25,2007 without  any complications.  The patient was maintained on Toprol and aspirin  therapy  during the hospital course.  He is now totally asymptomatic.  Further  management and followup per Dr. Glade Lloyd.   PROBLEM #3 - URINARY INCONTINENCE /URINARY TRACT INFECTION:  The patient  complained of urinary incontinence during the hospital course.  His symptoms  were consistent with urge incontinence.  A urinalysis was ordered and  revealed no significant white blood cells, although there was a significant  amount of urinary glucose.  However, a urine culture was sent and it is now  growing out greater than 100,000 colonies of gram-negative rods.  In light  of his symptoms, he will be started on Levaquin 500 mg daily for 7 days.  The patient was also started on Detrol LA 2 mg nightly.  The patient was  advised to discontinue the Detrol LA if he had any difficulty with  initiating his urine flow.   PROBLEM #4 - HYPERTENSION:  The patient's systolic blood pressures were  moderately elevated during the hospital course, generally ranging from 150-  175.  The patient was maintained on Toprol-XL and Norvasc as previously  ordered.  However, in light of his ongoing hypertension, Altace at 2.5 mg  was added daily.   PROBLEM #5 - CHRONIC KIDNEY DISEASE:  The patient is followed by  nephrologist, Dr. Justin Mend, in the outpatient setting.  His baseline creatinine  is 1.6.  However, on admission, his creatinine was elevated at 2.3.  Following volume repletion, his creatinine normalized to baseline of 1.6. As  stated above, he was started on Altace at 2.5 mg daily.  His renal function  will need to be  monitored closely on Altace.   Additional lab results during hospital course are as follows: total  cholesterol of 167, triglycerides of 247, HDL cholesterol of 20, and LDL  cholesterol of 98.  The patient was maintained on Zetia during the hospital  course.  The patient's TSH was well within normal limits at 1.87.      Rexene Alberts, M.D.  Electronically Signed     DF/MEDQ  D:  05/18/2005  T:  05/19/2005  Job:  [id number]   cc:   Lynnell Chad. Shelia Media, M.D.  Fax: [phone]   Iran Sizer, MD  Fax: [phone]

## 2010-06-10 NOTE — Op Note (Signed)
NAME:  Colin [name], Colin [name] NO.:  [id number]   MEDICAL RECORD NO.:  [id number]          PATIENT TYPE:  INP   LOCATION:  2016                         FACILITY:  [facility]   PHYSICIAN:  Rosetta Posner, M.D.    DATE OF BIRTH:  08-08-1928   DATE OF PROCEDURE:  05/24/2004  DATE OF DISCHARGE:                                 OPERATIVE REPORT   PREOPERATIVE DIAGNOSIS:  Open right great toe amputation.   POSTOPERATIVE DIAGNOSIS:  Open right great toe amputation.   PROCEDURE:  Debridement and closure of right great toe amputation site.   SURGEON:  Rosetta Posner, M.D.   ASSISTANT:  Nurse.   ANESTHESIA:  Ankle block.   COMPLICATIONS:  None.   DISPOSITION:  To recovery room stable.   PROCEDURE IN DETAIL:  The patient was taken to the operating room and placed  in the supine position, where the area of the right foot was prepped and  draped in the usual sterile fashion.  The open toe amputation was debrided  and the skin edges were incised to freshen the skin edges.  There was no  evidence of contained pus.  The open area itself was debrided and had good  bleeding.  Hemostasis was obtained with electrocautery.  The wound was  irrigated with saline.  The wound was closed with interrupted 3-0 and 2-0  nylon sutures in horizontal mattress fashion.  The wound was dressed with  Kerlix, a sterile dressing was applied, and the patient was taken to the  recovery room in stable condition.      TFE/MEDQ  D:  05/24/2004  T:  05/24/2004  Job:  [id number]

## 2010-06-10 NOTE — Op Note (Signed)
NAME:  Colin [name], ADDY NO.:  [id number]   MEDICAL RECORD NO.:  [id number]          PATIENT TYPE:  AMB   LOCATION:  ENDO                         FACILITY:  [facility]   PHYSICIAN:  Waverly Ferrari, M.D.    DATE OF BIRTH:  1928-04-05   DATE OF PROCEDURE:  10/10/2005  DATE OF DISCHARGE:                                 OPERATIVE REPORT   SURGEON:  Waverly Ferrari, M.D.   PROCEDURE:  Upper endoscopy.   INDICATIONS:  Dysphagia.   ANESTHESIA:  Fentanyl 50 mcg, Versed 4 mg.   PROCEDURE:  With the patient mildly sedated in the left lateral decubitus  position, the Olympus videoscopic endoscope was inserted in the mouth and  passed under direct vision into the esophagus, which appeared normal.  We  passed into the stomach.  The fundus, body, antrum, duodenal bulb and second  portion of the duodenum all appeared normal.  From this point, the endoscope  was slowly withdrawn, taking circumferential views of the duodenal mucosa,  until the endoscope was then pulled back into the stomach and placed in  retroflexion to view the stomach from below.  The endoscope was straightened  and withdrawn, taking circumferential views of the remaining gastric and  esophageal mucosa.  The patient's vital signs and pulse oximetry remained  stable.  The patient tolerated the procedure well without apparent  complication.   FINDINGS:  Rather unremarkable examination.   PLAN:  Have the patient follow up with me as needed.           ______________________________  Waverly Ferrari, M.D.     GMO/MEDQ  D:  10/10/2005  T:  10/10/2005  Job:  [id number]

## 2010-06-10 NOTE — Discharge Summary (Signed)
NAME:  Colin, [name] NO.:  [id number]   MEDICAL RECORD NO.:  [id number]                   PATIENT TYPE:  INP   LOCATION:  2019                                 FACILITY:  [facility]   PHYSICIAN:  Ivin Poot, M.D.               DATE OF BIRTH:  December 12, 1928   DATE OF ADMISSION:  04/29/2002  DATE OF DISCHARGE:  05/15/2002                                 DISCHARGE SUMMARY   PRIMARY ADMITTING DIAGNOSIS:  Shortness of breath.   ADDITIONAL/DISCHARGE DIAGNOSES:  1. Congestive heart failure.  2. Non-Q-wave myocardial infarction.  3. Severe three-vessel coronary artery disease.  4. Type 2 insulin-dependent diabetes mellitus.  5. Hypercholesterolemia.  6. Hypertension.  7. Postoperative atrial fibrillation.  8. History of tobacco abuse.  9. Mild renal insufficiency.   PROCEDURES PERFORMED:  1. Cardiac catheterization.  2. Coronary artery bypass grafting x4 (left internal mammary artery to the     left anterior descending, saphenous vein graft to the posterior     descending, saphenous vein graft to the circumflex, saphenous vein graft     to the ramus intermedius).  3. Open saphenous vein harvest, right thigh, skin closure with staples.   HISTORY:  The patient is a 77 year old black male with a previous history of  coronary artery disease.  He presented on the date of this admission to  [facility] with acute onset of shortness of breath.  His symptoms  had markedly worsened and he did note some chest discomfort and presented to  the ER for further evaluation and treatment.   HOSPITAL COURSE:  He was admitted and treated initially for congestive heart  failure and subsequently ruled in for a myocardial infarction.  He was seen  in consultation by cardiology and it was felt that he should undergo cardiac  catheterization.  However, on admission, his creatinine was found to be  elevated at 2.1.  He was treated with Mucomyst and hydration and  ultimately  was able to undergo cardiac catheterization on April 29, 2002.  He was found  to have severe three-vessel coronary artery disease.  He had a mild left  ventricular dysfunction with an ejection fraction of 40%.  He was not felt  to be a good candidate for percutaneous intervention and therefore,  cardiothoracic surgery consultation was obtained.  He was seen by Dr. Tharon Aquas Trigt and it was agreed that surgery would be his best option.  However,  his renal function worsened post catheterization, therefore, his surgery was  put on hold until his renal function returned back to baseline.  He was able  to take him to the operating room on May 06, 2002 and he underwent CABG x4  with the above-noted grafts.  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  This was also excised at  the time of surgery.  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  He tolerated the procedures well and was transferred to the SICU in stable  condition.  He was able to be extubated shortly after surgery.  He was  hemodynamically stable and doing well on postop day 1.  He was maintained on  renal-dose dopamine and his renal function was followed very closely.  On  postop day 2, his chest x-ray showed significant atelectasis; he also had  coarse rhonchi throughout.  His sputum culture was obtained and he was  started on Cipro empirically for antibiotic coverage.  He was also treated  with aggressive pulmonary toilet measures.  He was also quite volume  overloaded and was started on diuresis.  He was slowly able to be weaned  from the dopamine drip.  His renal function remained stable somewhere  between 2.0 and 2.2.  On postop day 3, he developed atrial fibrillation and  was started on IV amiodarone.  He was able to convert to a sinus rhythm.  He  remained in the SICU for further observation while on the amiodarone drip.  By postop day 4, he was able to be  switched to p.o. amiodarone and by postop  day 5, he was ready for transfer to the floor.  His pulmonary status has  improved with use of his incentive spirometer, a flutter valve and nebulizer  treatments.  However, his renal function worsened while on diuretics, with  BUN trending up to 54 and creatinine to 2.5.  His Lasix was held until his  renal function came back down and at present, his renal function is back at  baseline of BUN at 41 and creatinine 2.0.  Otherwise, he has done well  postoperatively.  He has remained afebrile and all vital signs have been  stable throughout his admission.  He has been weaned off supplemental oxygen  and is maintaining O2 saturations of greater than 90% on room air.  His  blood sugars have remained stable on his home medication regimen.  He has  been ambulating in the halls with cardiac rehab phase 1 and is doing very  well.  He has remained in sinus rhythm on amiodarone as well as beta blocker  therapy.  His other lab values have remained stable with hemoglobin of 9.7,  hematocrit 28.9, white count 10.3, platelets 219,000, potassium 4.5.  His  surgical incisions site are healing well.  He still does need assistance  with ambulation and has improved since the initiation of the use of a  rolling walker.  It is felt that if he continues to do well and remains  stable, he will be ready for discharge home on May 15, 2002.   DISCHARGE MEDICATIONS:  1. Enteric-coated aspirin 325 mg daily.  2. Amiodarone 200 mg daily.  3. Lasix 40 mg daily x5 days.  4. K-Dur 20 mEq daily x5 days.  5. Atenolol 12.5 mg b.i.d.  6. Lipitor 40 mg daily.  7. Amaryl 4 mg daily.  8. Lantus 35 units q.h.s.  9. Tylox one to two q.4h. p.r.n. for pain.   DISCHARGE INSTRUCTIONS:  He is to refrain from driving, heavy lifting or  strenuous activity.  He is encouraged to continue daily walking and use of  his incentive spirometer.  Home health physical therapy has also  been arranged to help him to continue to mobilize at home.  A rolling walker has  also been obtained for home use.  He should continue a low-fat, low-sodium  diet.  He is asked to shower daily and clean his incisions with soap and  water.  Home health nursing has been arranged for cardiac restorative care  and will remove his staples in one week.   DISCHARGE FOLLOWUP:  He is asked to make an appointment to see Dr. Minette Brine.  Tysinger in two weeks.  He will then follow up with Dr. Prescott Gum on Friday,  Jun 06, 2002, at 12 p.m.  He is asked to have a chest x-ray at  [facility] one hour prior to this appointment and to bring  his films to Dr. Lucianne Lei Trigt's office for his review.  He is asked to call our  office if he experiences any redness, swelling or draining of the incision  sites, fever greater than 101, chest pain or shortness of breath symptoms.     Suzzanne Cloud, P.A.                        Ivin Poot, M.D.    GC/MEDQ  D:  05/14/2002  T:  05/15/2002  Job:  [id number]   cc:   Minette Brine. Glade Lloyd, M.D.  [facility] [place]  [place] [address]  Fax: [place] Shelia Media, M.D.  [address] [place]  [place] [address]  Fax: [phone]

## 2010-06-10 NOTE — Op Note (Signed)
   NAME:  Colin [name], Colin [name]                         ACCOUNT NO.:  [id number]   MEDICAL RECORD NO.:  [id number]                   PATIENT TYPE:  AMB   LOCATION:  ENDO                                 FACILITY:  [facility]   PHYSICIAN:  Waverly Ferrari, M.D.                 DATE OF BIRTH:  11-06-1928   DATE OF PROCEDURE:  07/16/2002  DATE OF DISCHARGE:                                 OPERATIVE REPORT   PROCEDURE:  Colonoscopy.   INDICATIONS:  Rectal bleeding.   ANESTHESIA:  Demerol 20 mg, Versed 2 mg additionally.   PROCEDURE:  With the patient mildly sedated in the left lateral decubitus  position, subsequently rolled onto his back, to the right lateral decubitus  position, to his abdomen multiple times, with pressure applied to the  abdomen, finally we reached the cecum, identified by ileocecal valve.  We  could only see the base of the cecum.  We could not enter into it, but no  gross lesions were seen.  A photograph was taken.  From this point the  colonoscope was slowly withdrawn, taking circumferential views of the  remaining colonic mucosa.  Of note, the prep was somewhat slightly  suboptimal in that there were areas of thick stool that could not be easily  suctioned, but we withdrew all the way to the rectum, which appeared normal  on direct and showed small hemorrhoids on retroflexed view.  The endoscope  was straightened and withdrawn.  The patient's vital signs and pulse  oximetry remained stable.  The patient tolerated the procedure well without  apparent complications.   FINDINGS:  This was a negative but very tortuous colonoscopic examination,  limited slightly by prep.   PLAN:  Have the patient follow up with me as needed.                                               Waverly Ferrari, M.D.    GMO/MEDQ  D:  07/16/2002  T:  07/16/2002  Job:  [id number]

## 2010-06-10 NOTE — H&P (Signed)
NAME:  Colin [name], Colin [name] NO.:  [id number]   MEDICAL RECORD NO.:  [id number]          PATIENT TYPE:  INP   LOCATION:  0103                         FACILITY:  [facility]   PHYSICIAN:  Cherene Altes, M.D.DATE OF BIRTH:  1928-07-10   DATE OF ADMISSION:  05/12/2005  DATE OF DISCHARGE:                                HISTORY & PHYSICAL   PRIMARY CARE PHYSICIAN:  Lynnell Chad. Shelia Media, M.D.   CHIEF COMPLAINT:  Hyperglycemia.   HISTORY OF PRESENT ILLNESS:  Colin [name] is a very pleasant 77-year-  old gentleman with an extensive medical history as noted below.  For the  last two to three weeks, he has noted severe polyuria.  For the last two to  three days, he has noted significant dizziness on attempts to stand.  He  had a feeling that his blood sugar was up.  He made an appointment to see  Dr. Shelia Media today in the office.  Evaluation in Dr. Pennie Banter office included a  comprehensive metabolic panel which returned revealing the patient's blood  sugar was greater than 600.  He was immediately advised that he should  report to the emergency room for evaluation.  In the emergency room,  Glucomander protocol has been initiated following a 10-unit bolus of regular  insulin.  CBG has slowly been improving.  Over the course of his ER stay,  however, his CBG has not yet normalized.  In fact, we would not want to as  we do wish to correct it slowly.  As a result, the patient is requiring  admission for management of his severely uncontrolled hyperglycemia.  AT the  present time, he has no other complaints.  He reports that he is dizzy when  he attempts to stand.  He does report that he has had visual blurriness over  the last two weeks that has come and gone.  He does admit, as noted above,  to polyuria but denies dysuria or hematuria.  He specifically denies chest  pain, fever, chills, nausea, vomiting, or shortness of breath.  The patient  reports that he has been taking all of his  medications but he is not able to  name them to me.  He says that he takes Lantus insulin and another insulin  with each meal but he can not describe to me that exact medication or its  proper dose.  The patient does admit that he has been very nonadherent to a  diabetic diet lately and has consumed significant number of pieces of cake  and pie.   REVIEW OF SYSTEMS:  A comprehensive review of systems is unremarkable with  the exception of the elements noted in the history of present illness above.   PAST MEDICAL HISTORY:  1.  Diabetes mellitus, type 2.  2.  Coronary artery disease with a history of MI, status post coronary      artery bypass graft, April 2004, x4 vessels, per Dr. Tharon Aquas Trigt.  3.  History of CVA.  4.  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  5.  Hyperlipidemia.  6.  Paroxysmal atrial fibrillation in the period immediately following his      coronary artery bypass graft.  7.  Chronic renal insufficiency with a baseline creatinine of 1.6 in April      2006.  Followed by Dr. Sherril Croon with [facility].  8.  COPD.  9.  Status post appendectomy.  10. Status post cholecystectomy.  11. Status post cataract extractions.  12. A 75-pack year total history of tobacco abuse, discontinued at the time      of his coronary artery bypass graft.  13. Negative colonoscopy and EGD, June 2004, by Dr. Waverly Ferrari.  14. Chronic normocytic anemia with hemoglobin values in the computer ranging      from 9.7 to 11.7 on previous admission.   MEDICATIONS:  The patient is not able to name the prescription medications  that he takes nor their doses.   ALLERGIES:  No known drug allergies.   FAMILY HISTORY:  Noncontributory secondary to age.   SOCIAL HISTORY:  The patient does not currently smoke tobacco.  He does not  drink alcohol.  He is married.  He lives in [place].   DATA REVIEW:  Hemoglobin is  low at 12.6 with an MCV of 75, white count and  platelet count are normal.  Sodium is low at 124.  Potassium chloride and  bicarb are normal.  BUN is elevated at 48.  Creatinine is elevated at 2.3.  Serum glucose is noted to be 697.  Urinalysis reveals greater than 1,000  glucose but is otherwise unremarkable.   PHYSICAL EXAMINATION:  VITAL SIGNS:  Temperature 99.0, blood pressure  141/79, heart rate 86, respiratory rate 16, O2 sat is 95% on room air.  GENERAL:  Well-developed, well-nourished male in no acute respiratory  distress.  HEENT:  Normocephalic, atraumatic.  Extraocular muscles intact bilaterally.  OC/OP clear.  NECK:  No significant JVD or lymphadenopathy at the present time.  LUNGS:  Clear to auscultation bilaterally without wheezes or rhonchi.  CARDIOVASCULAR:  Regular rate and rhythm with occasional ectopic beats  without gallop or rub.  ABDOMEN:  Overweight, nontender, nondistended.  Soft.  Bowel sounds present.  No hepatosplenomegaly.  No rebound.  No ascites.  EXTREMITIES:  No significant cyanosis, clubbing, edema of bilateral lower  extremities.  NEUROLOGIC:  Strength 5/5 bilateral upper and lower extremities.  Intact  sensation to touch throughout.  Alert and oriented x4.  Cranial nerves II-  XII are intact bilaterally.   IMPRESSION AND PLAN:  1.  Severely uncontrolled diabetes mellitus, type 2 with hyperglycemia.  Mr.      [name] has likely been suffering with markedly uncontrolled diabetes for      at least two to three weeks now by his own report.  Clear etiology for      this is the patient's own admission of significant dietary indiscretion.      We will continue the Glucomander protocol that has been initiated in the      emergency room.  We will follow the patient closely as we do not wish to      acutely drop his blood sugar.  I have counseled him extensively as to     the importance of a strict blood sugar adherence both in the short      course and in the  long course.  We will counsel him further during his      hospital  stay to help encourage this.  There is no evidence of a      diabetic ketoacidosis at the present time.  2.  Hyponatremia.  The patient's hyponatremia is felt to be      pseudohyponatremia and is related to his severe hyperglycemia.  We will      follow his sodium as the patient's hyperglycemia is corrected.  3.  Coronary artery disease.  The patient is asymptomatic at the present      time.  Looking at his old records, he does appear to have been on a beta-      blocker in the past.  Based upon his old medication regimen, I will dose      him with Toprol XL at 25 mg orally every day.  He will also be treated      with enteric-coated aspirin at 325 mg orally every day.  4.  Hyperlipidemia.  It is not clear if the patient is currently on a lipid-      lowering medication.  I will not initiate anything at this time.  We      will attempt to clarify his medication regimen tomorrow.  5.  Chronic normocytic anemia.  This is likely related to the patient's      chronic renal insufficiency.  The patient is followed by Dr. Edrick Oh.  He is likely suffering with an element of acute renal      insufficiency at the present time.  We will hydrate him gently with      normal saline intravenous fluid, and we will follow his creatinine as we      do so.  6.  Chronic obstructive pulmonary disease.  The patient's chronic      obstructive pulmonary disease appears to be well controlled at present.      There is no wheezing on exam and there is good air movement throughout.  7.  Anemia.  The patient's hemoglobin is presently actually higher than what      appears to be his baseline per review of old records.  This is likely      related to the patient's chronic renal insufficiency.  He has had a      negative colonoscopy and EGD within the last two to three years.  No      further workup will be pursued during this hospitalization.   This is      noted for the benefit of the patient's primary care physician for a      simple outpatient followup.      Cherene Altes, M.D.  Electronically Signed     JTM/MEDQ  D:  05/12/2005  T:  05/12/2005  Job:  [id number]   cc:   Lynnell Chad. Shelia Media, M.D.  Fax: [phone]

## 2010-06-10 NOTE — Consult Note (Signed)
NAME:  Colin [name], CHUTE NO.:  [id number]   MEDICAL RECORD NO.:  [id number]                   PATIENT TYPE:  INP   LOCATION:                                       FACILITY:  [facility]   PHYSICIAN:  Ivin Poot III, M.D.           DATE OF BIRTH:  1928/03/31   DATE OF CONSULTATION:  DATE OF DISCHARGE:                                   CONSULTATION   REASON FOR CONSULTATION:  Left main stenosis with severe three vessel  coronary artery disease, reduced LV function, congestive heart failure, and  non-Q wave myocardial infarction.   CHIEF COMPLAINT:  Shortness of breath and chest pain.   HISTORY OF PRESENT ILLNESS:  I was asked to evaluate this 77 year old black  male for potential surgical coronary revascularization because of recently  diagnosed left main stenosis and severe three vessel coronary disease with  ejection fraction of 40%. The patient was admitted to [facility] on April 26, 2002 with symptoms of shortness of breath, orthopnea  and interstitial pulmonary edema on chest x-ray. He was brought to the  emergency room by EMS squad with substernal chest pain, pressing in nature  with radiation to the left shoulder. There is no nausea and vomiting. No  fever, chills, or diaphoresis. The pain improved with Nitroglycerin and O2.  Cardiac enzymes were mildly positive. His CPK MB was 13 and troponin was 0.1  with a hemoglobin A1C of 7.6 and creatinine of 1.7. He was placed on  Heparin, Nitroglycerin and diuresed with Lasix and underwent cardiac  catheterization by Dr. Glade Lloyd on April 29, 2002. This demonstrated a 70-80%  left main stenosis with a long 80% stenosis of the right coronary with  ejection fraction of 40%. LV AP was 15 mmHg. He was felt to be a surgical  candidate based on his severe coronary disease and mild to moderate  reduction in LV function.   PAST MEDICAL HISTORY:  1. Insulin dependent diabetes mellitus.  2.  Hyperlipidemia.  3. Hypertension.  4. Actively smoking.  5. Chronic obstructive pulmonary disease.  6. Chronic renal insufficiency with creatinine of 1.6 pre cath and 2.2 post     catheterization.   HOME MEDICATIONS:  Include Lipitor 40 mg by mouth each day, aspirin one by  mouth each day, Atenolol 50 mg each day, Amaryl 4 mg each day, Lantus  insulin 35 units at bedtime.   ALLERGIES:  No known drug allergies.   SOCIAL HISTORY:  The patient smokes 1 pack per day of cigarettes. He does  not use alcohol. He lives with his wife and children at home and is retired.   FAMILY HISTORY:  Negative for coronary disease. Positive for hypertension  and diabetes mellitus.   PAST SURGICAL HISTORY:  Significant for appendectomy and gallbladder  surgery. No anesthetic complications or bleeding problems with these  operations.   REVIEW OF SYSTEMS:  Denies  constitutional symptoms, weight loss or fever.  Denies any change in vision or recent active dental problems. Denies any  active problems swallowing or dysphagia. He has a chronic productive cough  and dyspnea on exertion with recent decline in exercise tolerance. He has  had recent symptoms of unstable angina and has a previous cardiac  catheterization with moderate coronary disease four years ago and treated  medically. GI review negative for change in bowel habits, jaundice, or  hepatitis. Urologic review is negative for hematuria or prostate cancer.  Negative for any thoracic or lower extremity trauma. Vascular review  negative for deep vein thrombosis. Positive for claudication with ADI's of  0.3 in each lower extremity. He has had no neurologic problems with recent  transischemic attack but does have a history of a stroke in the past.   PHYSICAL EXAMINATION:  VITAL SIGNS: Height 5'11. Weight 195 pounds. Blood  pressure 160/80. Pulse 60 and regular. Respiratory rate 18.  GENERAL: A pleasant elderly black gentleman in no distress. In the  hospital  following cardiac catheterization.  HEENT: Normocephalic. Extraocular muscles intact.  NECK: Without jugular venous distention, cervical mass or bruit.  LYMPH: No palpable supraclavicular or axillary adenopathy.  LUNGS: Diffuse rhonchi. There is no thoracic deformity.  CARDIAC: Regular rhythm without S3, gallop or murmur.  ABDOMEN: Soft, nontender, nondistended.  EXTREMITIES: No significant edema and non-palpable pedal pulses. No cyanosis  but mild clubbing of the nail bed.  RECTAL: Examination is deferred.  SKIN: Without rash or lesion.  NEURO: Examination is intact.   DIAGNOSTIC IMPRESSION:  I reviewed the coronary arteriograms with Dr.  Glade Lloyd and agree with his interpretation of significant left main and  three vessel disease with moderate reduction of LV function. He would  benefit from surgical revascularization. Prior to surgery, his comorbid  medical problems of chronic obstructive pulmonary disease and prior  bronchitis as well as renal insufficiency need to be optimized. He will be  tentatively scheduled for surgical revascularization on Tuesday, May 06, 2002.   Thank you very much for this consultation.                                               Len Childs, M.D.    PV/MEDQ  D:  05/01/2002  T:  05/01/2002  Job:  [id number]   cc:   Minette Brine. Glade Lloyd, M.D.  [place]  [place] [address]  Fax: Sheridan Shelia Media, M.D.  [address] [place]  [place] [address]  Fax: [phone]

## 2010-06-10 NOTE — Op Note (Signed)
   NAME:  Colin [name], Colin [name]                         ACCOUNT NO.:  [id number]   MEDICAL RECORD NO.:  [id number]                   PATIENT TYPE:  AMB   LOCATION:  ENDO                                 FACILITY:  [facility]   PHYSICIAN:  Waverly Ferrari, M.D.                 DATE OF BIRTH:  January 08, 1929   DATE OF PROCEDURE:  07/16/2002  DATE OF DISCHARGE:                                 OPERATIVE REPORT   PROCEDURE:  Upper endoscopy.   INDICATIONS:  Rectal bleeding.   ANESTHESIA:  1. Demerol 50 mg.  2. Versed 5 mg.   DESCRIPTION OF PROCEDURE:  With patient mildly sedated in the left lateral  decubitus position, the Olympus videoscopic endoscope was inserted in the  mouth, passed under direct vision through the esophagus, into the stomach.  Fundus, body, antrum, duodenal bulb, second portion duodenum.  From this  point, the endoscope was slowly withdrawn, taking circumferential views of  the duodenal mucosa until the endoscope was pulled back into the stomach,  placed in retroflexion to view the stomach from below.  The endoscope was  then straightened and withdrawn, taking circumferential views of the  remaining gastric and esophageal mucosa.  The patient's vital signs and  pulse oximeter remained stable.  The patient tolerated the procedure well  without apparent complications.   FINDINGS:  Unremarkable examination.   PLAN:  Proceed to colonoscopy.                                               Waverly Ferrari, M.D.    GMO/MEDQ  D:  07/16/2002  T:  07/16/2002  Job:  [id number]

## 2010-06-10 NOTE — Cardiovascular Report (Signed)
NAME:  Colin [name], Colin [name]                         ACCOUNT NO.:  [id number]   MEDICAL RECORD NO.:  [id number]                   PATIENT TYPE:  OUT   LOCATION:  CATH                                 FACILITY:  [facility]   PHYSICIAN:  Minette Brine. Tysinger, M.D.              DATE OF BIRTH:  [birth year]   DATE OF PROCEDURE:  04/29/2002  DATE OF DISCHARGE:                              CARDIAC CATHETERIZATION   PROCEDURES:  1. Left heart catheterization.  2. Coronary cineangiography.  3. Left internal mammary artery cineangiography.  4. Selective renal artery cineangiography, bilaterally.  5. Left ventricular cineangiography.  6. Perclose of the right femoral artery.   INDICATIONS FOR PROCEDURES:  This 77 year old male was admitted to [facility] on April 26, 2002, with congestive heart failure and chest  discomfort.  His serial enzymes were positive for a non-Q-wave myocardial  infarct.  He has a history of coronary artery disease, status post a cardiac  catheterization in 1995, at which time he had a 70% stenosis in his mid LAD.  A followup radionuclear study showed no sign for significant ischemia, and  he was treated medically.  He also has a history of peripheral vascular  disease with claudication and a peripheral vascular study done in 1998  showed total occlusion of his left superficial femoral artery, and also  showed mild stenosis of his left renal artery.  During his admission, his  BUN and creatinine were normal on admission.  However, a followup creatinine  test has shown a very mild gradual increase in his creatinine.  He was  therefore given Mucomyst for one day prior to his catheterization and also  hydrated this a.m. prior to the catheterization.   PROCEDURE:  After signing an informed consent, the patient was transported  from his bed at [facility] to the cardiac catheterization lab at  [facility].  His right groin was prepped and draped in a sterile  fashion, and then anesthetized locally with 1% lidocaine.  A 6-French  introducer sheath was inserted percutaneously into the right femoral artery.  Then 6-French #4 Judkins coronary catheters were used to make injections  into the native coronary arteries.  The right coronary catheter was also  used to make a midstream injection into the left subclavian artery to  visualize the left internal mammary artery, and also selectively into both  renal arteries.  A 6-French pigtail catheter was used to measure pressures  in the left ventricle and aorta, and to make a midstream injection into the  left ventricle.  The patient tolerated the procedure well and no  complications are noted.  At the end of the procedure, the catheter and  sheath were removed from the right femoral artery and hemostasis was easily  obtained with a Perclose closure system.   MEDICATIONS GIVEN:  None.   HEMODYNAMIC DATA:  Left ventricular pressure was 164/10 to 15.  Aortic  pressure 165/62 with a mean of 94.  Left ventricular ejection fraction was  estimated at 40%.   CINEANGIOGRAPHY FINDINGS:  CORONARY CINEANGIOGRAPHY:  Left Coronary Artery:  The ostium appears normal.  There is a focal  concentric lesion in the mid left main coronary artery of 60% to 70%  stenosis.   Left Anterior Descending:  The LAD is irregular in the proximal segment, and  there is a mild stenosis in the middle segment.  The first anterolateral  branch had a significant lesion with a 70% stenosis in its proximal segment.   Circumflex Coronary Artery:  Appears normal.   Right Coronary Artery:  The ostium appears normal.  There is a long  segmental 80% stenosis in the proximal to middle segment with antegrade  flow.  The distal right coronary system appears normal.   Left Internal Mammary Artery:  Appears normal.   LEFT VENTRICULAR CINEANGIOGRAM:  The left ventricular chamber size is mildly  enlarged.  The left ventricular wall thickness  appears normal.  The overall  left ventricular contractility is decreased with an ejection fraction of  approximately 40%.   SELECTIVE RENAL ARTERY ANGIOGRAPHY:  The right renal artery appears normal.  The left renal artery has a 30% stenosis in its proximal segment.   FINAL DIAGNOSES:  1. Three-vessel coronary artery disease with 60% to 70% left main stenosis,     and 80% segmental stenosis of the proximal right coronary artery.  2. Normal left internal mammary artery.  3. Moderate left ventricular dysfunction, ejection fraction of approximately     40%.  4. Mild left renal artery stenosis, unchanged from his prior study.  5. Successful Perclose of the right femoral artery.   DISPOSITION:  Will keep here at [facility] on telemetry and ask  CVTS to see for possible coronary artery bypass graft surgery.  We will  continue to hydrate him over the next 2 hours, and recheck his BMET in the  a.m.                                               John R. Tysinger, M.D.    JRT/MEDQ  D:  04/29/2002  T:  04/30/2002  Job:  [id number]   cc:   Lynnell Chad. Shelia Media, M.D.  [address] [place]  [place] [address]  Fax: [phone]   Cath Lab at [facility]   Records Room at [facility]

## 2010-06-10 NOTE — Cardiovascular Report (Signed)
NAME:  Colin [name], Colin [name] NO.:  [id number]   MEDICAL RECORD NO.:  [id number]          PATIENT TYPE:  AMB   LOCATION:  CATH                         FACILITY:  [facility]   PHYSICIAN:  Iran Sizer, MD    DATE OF BIRTH:  1928-03-29   DATE OF PROCEDURE:  05/17/2005  DATE OF DISCHARGE:                              CARDIAC CATHETERIZATION   PROCEDURE:  Insertion of dual-chamber permanent transvenous pacemaker, DDD  mode.   INDICATIONS FOR PROCEDURE:  Symptomatic tachy-brady syndrome.   PROCEDURE:  After signing an informed consent, the patient was premedicated  with 5 mg of Valium by mouth and brought to the cardiac catheterization lab  at [facility].  He was transported from his bed at [facility]  His right anterior chest was prepped and draped in a sterile  fashion, and a right transverse subclavicular plane was anesthetized locally  with 1% lidocaine.  An incision was made in this anesthetized plane with the  incision being deepened into the fascial layer overlying the pectoralis  muscle.  A pocket was then formed in this fascial layer for later insertion  of the pulse generator.  An 8-French Cook introducer sheath was inserted  percutaneously in the right subclavian vein with a Seldinger wire being  easily passed into the superior vena cava.  A bipolar ventricular lead was  then selected made by [facility], model number 1336T, serial number  [id number].  After proper preparation, it was inserted through the 8-French  sheath and advanced to the superior vena cava.  The sheath was removed in  the usual fashion.  A 7-French Cook introducer sheath was inserted  percutaneously into the right subclavian vein, again with a Seldinger wire  being easily passed into the superior vena cava.  A passive atrial J lead  was then selected, model number [id number],  serial number [id number].  After  properly preparing the atrial J lead, it was inserted through the  7-French  sheath and advanced to the superior vena cava.  Both sheaths were removed in  the usual fashion.  The ventricular lead was then advanced into the right  atrium and right ventricle where the tip was positioned in the apex of the  right ventricle.  Very good pacing parameters were obtained with a minimal  voltage threshold of 0.4 volts utilizing 0.2 mA of current.  The resistance  was measured at 922 ohms, and the R-wave sensitivity measured 21.2 mV.  We  then advanced the atrial J lead into the right atrium where the tip was  positioned anteriorly in the right atrial appendage.  Again, very good  pacing parameters were obtained with a minimum voltage threshold of 0.4  volts utilizing 0.6 mA of current.  The resistance was measured at 454 ohms,  and the P-wave sensitivity measured 2.9 mV.  After obtaining these pacing  parameters, both leads were secured properly at their insertion site.  The  wound was lavaged profusely with kanamycin solution.  We then selected a  pulse generator made by [employer], model Alsip, model number [id number],  serial number [id number].  After properly analyzing the pulse generator, it was  attached to the pacing electrodes in the usual fashion.  The pulse generator  was then placed within the previously-formed pocket and the wound was closed  in layers using 2-0 Vicryl.  Final skin closure was obtained with a  cutaneous layer of Steri-Strips.  The patient tolerated the procedure well,  and no complications were noted.  At the end of the procedure, a sterile  bulky dressing was applied to the wound, and he was returned to his bed at  [facility] in satisfactory condition.   MEDICATIONS GIVEN:  None.   The pacemaker was noted to be functioning normally in the DDD mode.  Wound  care instructions were given.      Iran Sizer, MD  Electronically Signed     JRT/MEDQ  D:  05/17/2005  T:  05/18/2005  Job:  [id number]   cc:   Lynnell Chad.  Shelia Media, M.D.  Fax: [facility]

## 2010-09-20 ENCOUNTER — Encounter: Payer: Self-pay | Admitting: *Deleted

## 2010-10-07 ENCOUNTER — Other Ambulatory Visit (HOSPITAL_COMMUNITY): Payer: Self-pay | Admitting: Urology

## 2010-10-07 DIAGNOSIS — C61 Malignant neoplasm of prostate: Secondary | ICD-10-CM

## 2010-10-13 LAB — CBC
Hemoglobin: 10.5 — ABNORMAL LOW
MCHC: 33.5
MCV: 82.4
RBC: 3.81 — ABNORMAL LOW
RBC: 4.91
RDW: 14.9
WBC: 4.6

## 2010-10-13 LAB — MAGNESIUM: Magnesium: 1.7

## 2010-10-13 LAB — URINE CULTURE
Colony Count: NO GROWTH
Culture: NO GROWTH

## 2010-10-13 LAB — BLOOD GAS, ARTERIAL
FIO2: 0.21
O2 Saturation: 95.9
Patient temperature: 98.6
pH, Arterial: 7.419

## 2010-10-13 LAB — LIPID PANEL
Cholesterol: 185
LDL Cholesterol: 133 — ABNORMAL HIGH
Total CHOL/HDL Ratio: 10.9
Triglycerides: 177 — ABNORMAL HIGH
VLDL: 35

## 2010-10-13 LAB — RENAL FUNCTION PANEL
Albumin: 2.6 — ABNORMAL LOW
Albumin: 3.6
BUN: 60 — ABNORMAL HIGH
Creatinine, Ser: 2.07 — ABNORMAL HIGH
GFR calc non Af Amer: 24 — ABNORMAL LOW
Glucose, Bld: 203 — ABNORMAL HIGH
Phosphorus: 3.4
Phosphorus: 4
Potassium: 3.8
Potassium: 5.7 — ABNORMAL HIGH
Sodium: 126 — ABNORMAL LOW

## 2010-10-13 LAB — COMPREHENSIVE METABOLIC PANEL
CO2: 35 — ABNORMAL HIGH
Calcium: 9.2
Creatinine, Ser: 1.93 — ABNORMAL HIGH
GFR calc Af Amer: 41 — ABNORMAL LOW
GFR calc non Af Amer: 34 — ABNORMAL LOW
Glucose, Bld: 132 — ABNORMAL HIGH
Sodium: 138
Total Protein: 5.6 — ABNORMAL LOW

## 2010-10-13 LAB — POCT I-STAT 3, ART BLOOD GAS (G3+)
O2 Saturation: 94
Patient temperature: 97.6
pCO2 arterial: 36.6
pH, Arterial: 7.278 — ABNORMAL LOW

## 2010-10-13 LAB — BASIC METABOLIC PANEL
BUN: 33 — ABNORMAL HIGH
Calcium: 10.3
Calcium: 9.2
Creatinine, Ser: 2.66 — ABNORMAL HIGH
GFR calc Af Amer: 28 — ABNORMAL LOW
GFR calc non Af Amer: 23 — ABNORMAL LOW
GFR calc non Af Amer: 37 — ABNORMAL LOW
Glucose, Bld: 112 — ABNORMAL HIGH
Glucose, Bld: 211 — ABNORMAL HIGH
Sodium: 133 — ABNORMAL LOW

## 2010-10-13 LAB — DIFFERENTIAL
Lymphs Abs: 1.4
Monocytes Relative: 11
Neutro Abs: 2.7
Neutrophils Relative %: 58

## 2010-10-13 LAB — URINALYSIS, ROUTINE W REFLEX MICROSCOPIC
Bilirubin Urine: NEGATIVE
Ketones, ur: NEGATIVE
Nitrite: NEGATIVE
Protein, ur: 30 — AB
Specific Gravity, Urine: 1.022
Urobilinogen, UA: 0.2

## 2010-10-13 LAB — PHOSPHORUS: Phosphorus: 2.5

## 2010-10-13 LAB — URINE MICROSCOPIC-ADD ON

## 2010-10-13 LAB — APTT: aPTT: 26

## 2010-10-13 LAB — PROTIME-INR: INR: 1

## 2010-10-14 LAB — I-STAT 8, (EC8 V) (CONVERTED LAB)
BUN: 40 — ABNORMAL HIGH
Chloride: 115 — ABNORMAL HIGH
Glucose, Bld: 70
pCO2, Ven: 36.8 — ABNORMAL LOW
pH, Ven: 7.313 — ABNORMAL HIGH

## 2010-10-14 LAB — POTASSIUM: Potassium: 5.9 — ABNORMAL HIGH

## 2010-10-17 LAB — BASIC METABOLIC PANEL
CO2: 20
CO2: 25
CO2: 26
CO2: 28
CO2: 25
CO2: 25
Calcium: 9
Calcium: 9.4
Calcium: 8.6
Calcium: 8.7
Chloride: 104
Chloride: 105
Chloride: 106
Creatinine, Ser: 1.61 — ABNORMAL HIGH
Creatinine, Ser: 1.77 — ABNORMAL HIGH
GFR calc Af Amer: 42 — ABNORMAL LOW
GFR calc Af Amer: 48 — ABNORMAL LOW
GFR calc Af Amer: 51 — ABNORMAL LOW
GFR calc Af Amer: 47 — ABNORMAL LOW
GFR calc non Af Amer: 39 — ABNORMAL LOW
Glucose, Bld: 76
Glucose, Bld: 188 — ABNORMAL HIGH
Potassium: 4.4
Potassium: 4.5
Sodium: 132 — ABNORMAL LOW
Sodium: 136
Sodium: 136
Sodium: 139
Sodium: 136

## 2010-10-17 LAB — CROSSMATCH
ABO/RH(D): O POS
Antibody Screen: NEGATIVE

## 2010-10-17 LAB — CBC
HCT: 29.8 — ABNORMAL LOW
Hemoglobin: 8.4 — ABNORMAL LOW
Hemoglobin: 9.6 — ABNORMAL LOW
Hemoglobin: 9.7 — ABNORMAL LOW
Hemoglobin: 7.3 — CL
Hemoglobin: 7.4 — CL
MCHC: 32.5
MCHC: 32.5
MCHC: 32.9
MCHC: 32.2
MCHC: 32.5
MCHC: 32.5
MCV: 82.2
MCV: 82.4
MCV: 82.1
Platelets: 272
Platelets: 282
RBC: 3.17 — ABNORMAL LOW
RBC: 3.55 — ABNORMAL LOW
RBC: 3.63 — ABNORMAL LOW
RBC: 2.77 — ABNORMAL LOW
RBC: 3.01 — ABNORMAL LOW
RBC: 3.57 — ABNORMAL LOW
RDW: 14.8
WBC: 6.9
WBC: 7.3
WBC: 8.9

## 2010-10-17 LAB — HEMOGLOBIN AND HEMATOCRIT, BLOOD
HCT: 23.7 — ABNORMAL LOW
Hemoglobin: 7.7 — CL

## 2010-10-18 LAB — CBC
Hemoglobin: 11 — ABNORMAL LOW
MCHC: 32.2
MCV: 81.8
RBC: 4.17 — ABNORMAL LOW
WBC: 7.4

## 2010-10-18 LAB — BASIC METABOLIC PANEL
CO2: 22
Chloride: 106
Creatinine, Ser: 1.71 — ABNORMAL HIGH
GFR calc Af Amer: 47 — ABNORMAL LOW
Sodium: 133 — ABNORMAL LOW

## 2010-10-31 ENCOUNTER — Ambulatory Visit (HOSPITAL_COMMUNITY): Payer: Self-pay

## 2010-10-31 ENCOUNTER — Encounter: Payer: Self-pay | Admitting: *Deleted

## 2010-10-31 ENCOUNTER — Encounter (HOSPITAL_COMMUNITY): Payer: Self-pay

## 2010-10-31 DIAGNOSIS — Z8673 Personal history of transient ischemic attack (TIA), and cerebral infarction without residual deficits: Secondary | ICD-10-CM | POA: Insufficient documentation

## 2010-10-31 DIAGNOSIS — N179 Acute kidney failure, unspecified: Secondary | ICD-10-CM | POA: Insufficient documentation

## 2010-10-31 DIAGNOSIS — I5022 Chronic systolic (congestive) heart failure: Secondary | ICD-10-CM | POA: Insufficient documentation

## 2010-10-31 DIAGNOSIS — J449 Chronic obstructive pulmonary disease, unspecified: Secondary | ICD-10-CM | POA: Insufficient documentation

## 2010-10-31 DIAGNOSIS — Z862 Personal history of diseases of the blood and blood-forming organs and certain disorders involving the immune mechanism: Secondary | ICD-10-CM | POA: Insufficient documentation

## 2010-10-31 DIAGNOSIS — I255 Ischemic cardiomyopathy: Secondary | ICD-10-CM | POA: Insufficient documentation

## 2010-10-31 DIAGNOSIS — I739 Peripheral vascular disease, unspecified: Secondary | ICD-10-CM | POA: Insufficient documentation

## 2010-11-01 ENCOUNTER — Encounter: Payer: Self-pay | Admitting: Cardiovascular Disease

## 2010-11-01 ENCOUNTER — Ambulatory Visit (INDEPENDENT_AMBULATORY_CARE_PROVIDER_SITE_OTHER): Payer: Self-pay | Admitting: Cardiovascular Disease

## 2010-11-01 VITALS — BP 135/60 | HR 59 | Ht 69.0 in | Wt 196.8 lb

## 2010-11-01 DIAGNOSIS — I498 Other specified cardiac arrhythmias: Secondary | ICD-10-CM

## 2010-11-01 DIAGNOSIS — I509 Heart failure, unspecified: Secondary | ICD-10-CM

## 2010-11-01 DIAGNOSIS — Z950 Presence of cardiac pacemaker: Secondary | ICD-10-CM

## 2010-11-01 DIAGNOSIS — I2510 Atherosclerotic heart disease of native coronary artery without angina pectoris: Secondary | ICD-10-CM

## 2010-11-01 DIAGNOSIS — I739 Peripheral vascular disease, unspecified: Secondary | ICD-10-CM

## 2010-11-01 DIAGNOSIS — R001 Bradycardia, unspecified: Secondary | ICD-10-CM

## 2010-11-01 NOTE — Patient Instructions (Addendum)
Your physician wants you to follow-up in: 6 months You will receive a reminder letter in the mail two months in advance. If you don't receive a letter, please call our office to schedule the follow-up appointment.   Your physician recommends that you return for a FASTING lipid profile: 6 months  You have a referral for the device clinic.

## 2010-11-01 NOTE — Progress Notes (Signed)
Colin [name] Date of Birth  10-06-1928 [facility] [address]    [place], [place]  [address] [phone]  Fax  [phone]  History of Present Illness:  77 year old gentleman with a history of coronary artery disease and congestive heart failure.  He was a previous patient of Jenny Reichmann Tysinger's and then later a previous patient of Dr. Verlon Setting. I first met him in November 2010 after Dr. Verlon Setting left.  He has a history of coronary artery disease and status post coronary artery bypass grafting in 2004. He hasn't history of congestive heart failure with an ejection fraction of around 35%. He also has a history of chronic renal insufficiency with baseline creatinine of around 2.4.   He's done fairly well on medical therapy. His blood pressure has been fairly well controlled. He has not had any episodes of chest pain. He has had some episodes of shortness breath particularly with exertion. He's not been eating any extra salt.  He has a history of a [employer] Jude pacemaker.  His pacemaker has not been followed up in quite some time.   Current Outpatient Prescriptions on File Prior to Visit  Medication Sig Dispense Refill  . amLODipine (NORVASC) 10 MG tablet Take 10 mg by mouth daily.        . carvedilol (COREG) 12.5 MG tablet Take 12.5 mg by mouth 2 (two) times daily with a meal.        . Cyanocobalamin (VITAMIN B 12 PO) Take by mouth. 1000 mcg daily       . ezetimibe (ZETIA) 10 MG tablet Take 10 mg by mouth daily.        . insulin lispro protamine-insulin lispro (HUMALOG 50/50) (50-50) 100 UNIT/ML SUSP Inject into the skin 2 (two) times daily before a meal. 30 units in the am and 15 in the pm       . L-Methylfolate-B6-B12 (METANX PO) Take by mouth 2 (two) times daily.        . Omega-3 Fatty Acids (FISH OIL) 1000 MG CAPS Take by mouth daily.        . silodosin (RAPAFLO) 8 MG CAPS capsule Take 8 mg by mouth once a week.        . SODIUM POLYSTYRENE SULFONATE PO Take by mouth. Orally  prn         Allergies  Allergen Reactions  . Lisinopril     Elevated potassium,renal insuf    Past Medical History  Diagnosis Date  . Coronary artery disease   . Peripheral vascular disease   . Dyslipidemia   . Diabetes mellitus   . Hypertension   . Chronic renal insufficiency   . CHF (congestive heart failure) 4 of 2007    ef 38%  . History of CVA (cerebrovascular accident)   . Cardiomyopathy   . COPD (chronic obstructive pulmonary disease)   . History of anemia     Past Surgical History  Procedure Date  . Insert / replace / remove pacemaker 05/17/2005    st. jude dual chamber ddd mode inserted  . Coronary artery bypass graft 4 of 2004    4 vessel bypass  . Femoral bypass july 2006    right with right great toe amputation  . Cardiac catheterization 04/29/2002    Dr. Glade Lloyd    History  Smoking status  . Former Smoker  Smokeless tobacco  . Not on file    History  Alcohol Use     No family history on file.  Reviw of Systems:  Reviewed in the HPI.  All other systems are negative.  Physical Exam: BP 135/60  Pulse 59  Ht 5\' 9"  (1.753 m)  Wt 196 lb 12.8 oz (89.268 kg)  BMI 29.06 kg/m2 The patient is alert and oriented x 3.  The mood and affect are normal.   Skin: warm and dry.  Color is normal.    HEENT:   the sclera are nonicteric.  The mucous membranes are moist.  The carotids are 2+ without bruits.  There is no thyromegaly.  There is no JVD.    Lungs: clear.  The chest wall is non tender.    Heart: regular rate with a normal S1 and S2.  There is a soft systolic murmur . The PMI is not displaced.     Abdomen: good bowel sounds.  There is no guarding or rebound.  There is no hepatosplenomegaly or tenderness.  There are no masses.   Extremities:  no clubbing, cyanosis, or edema.  The legs are without rashes.  The distal pulses are intact.   Neuro:  Cranial nerves II - XII are intact.  Motor and sensory functions are intact.    The gait is  normal.  ECG: Atrial pacing. His left ventricular hypertrophy with repolarization abnormalities.   Assessment / Plan:

## 2010-11-01 NOTE — Assessment & Plan Note (Signed)
he's not had his pacemaker interrogated in quite some time. We'll get him hooked back in with device clinic.

## 2010-11-01 NOTE — Assessment & Plan Note (Signed)
He status post fem-fem bypass surgery in the past.  He seems to be stable.

## 2010-11-01 NOTE — Assessment & Plan Note (Signed)
His congestive heart failure seems to be fairly well controlled. I've asked him to make sure that he does need any extra salt. We'll continue the same medications and aggressive blood pressure therapy.

## 2010-11-01 NOTE — Assessment & Plan Note (Signed)
Has a history of coronary artery bypass grafting in 2004. He's not had any episodes of chest pain or shortness breath. We will see him again in 6 months for office visit, lipid profile, hepatic profile and basic metabolic profile.

## 2010-11-02 ENCOUNTER — Ambulatory Visit (HOSPITAL_COMMUNITY): Payer: Self-pay

## 2010-11-02 ENCOUNTER — Encounter (HOSPITAL_COMMUNITY)
Admission: RE | Admit: 2010-11-02 | Discharge: 2010-11-02 | Disposition: A | Discharge status: Home / Self Care | Payer: Medicare Other | Source: Ambulatory Visit | Attending: Urology | Admitting: Urology

## 2010-11-02 ENCOUNTER — Encounter (HOSPITAL_COMMUNITY): Payer: Self-pay

## 2010-11-02 DIAGNOSIS — C61 Malignant neoplasm of prostate: Secondary | ICD-10-CM | POA: Insufficient documentation

## 2010-11-02 MED ORDER — TECHNETIUM TC 99M MEDRONATE IV KIT
25.0000 | PACK | Freq: Once | INTRAVENOUS | Status: AC | PRN
  Administered 2010-11-02: 25 via INTRAVENOUS

## 2010-12-22 ENCOUNTER — Encounter: Payer: Medicare Other | Admitting: Internal Medicine

## 2011-01-23 ENCOUNTER — Ambulatory Visit (INDEPENDENT_AMBULATORY_CARE_PROVIDER_SITE_OTHER): Payer: Medicare Other | Admitting: *Deleted

## 2011-01-23 ENCOUNTER — Encounter: Payer: Self-pay | Admitting: Internal Medicine

## 2011-01-23 DIAGNOSIS — I428 Other cardiomyopathies: Secondary | ICD-10-CM

## 2011-01-23 DIAGNOSIS — I4891 Unspecified atrial fibrillation: Secondary | ICD-10-CM

## 2011-01-23 LAB — PACEMAKER DEVICE OBSERVATION
AL AMPLITUDE: 2.3 mv
AL THRESHOLD: 0.5 V
BAMS-0001: 170 {beats}/min
BAMS-0003: 70 {beats}/min
BATTERY VOLTAGE: 2.78 V
DEVICE MODEL PM: 1686193
RV LEAD AMPLITUDE: 7.7 mv

## 2011-01-23 NOTE — Progress Notes (Signed)
Pacer check in clinic  

## 2011-03-03 ENCOUNTER — Telehealth: Payer: Self-pay | Admitting: Internal Medicine

## 2011-03-03 NOTE — Telephone Encounter (Signed)
01-04-11 sent past due letter/mt

## 2011-03-30 ENCOUNTER — Encounter: Payer: Self-pay | Admitting: Nurse Practitioner

## 2011-04-04 ENCOUNTER — Ambulatory Visit: Payer: Medicare Other | Admitting: Nurse Practitioner

## 2011-04-10 ENCOUNTER — Ambulatory Visit (INDEPENDENT_AMBULATORY_CARE_PROVIDER_SITE_OTHER): Payer: Medicare Other | Admitting: Nurse Practitioner

## 2011-04-10 ENCOUNTER — Other Ambulatory Visit: Payer: Self-pay | Admitting: *Deleted

## 2011-04-10 ENCOUNTER — Ambulatory Visit
Admission: RE | Admit: 2011-04-10 | Discharge: 2011-04-10 | Disposition: A | Discharge status: Home / Self Care | Payer: Medicare Other | Source: Ambulatory Visit | Attending: Nurse Practitioner | Admitting: Nurse Practitioner

## 2011-04-10 ENCOUNTER — Encounter: Payer: Self-pay | Admitting: Nurse Practitioner

## 2011-04-10 VITALS — BP 148/78 | HR 80 | Ht 69.0 in | Wt 197.0 lb

## 2011-04-10 DIAGNOSIS — N189 Chronic kidney disease, unspecified: Secondary | ICD-10-CM

## 2011-04-10 DIAGNOSIS — I5020 Unspecified systolic (congestive) heart failure: Secondary | ICD-10-CM

## 2011-04-10 DIAGNOSIS — R0600 Dyspnea, unspecified: Secondary | ICD-10-CM

## 2011-04-10 DIAGNOSIS — R0602 Shortness of breath: Secondary | ICD-10-CM

## 2011-04-10 LAB — CBC WITH DIFFERENTIAL/PLATELET
Basophils Absolute: 0 10*3/uL (ref 0.0–0.1)
Basophils Relative: 0.5 % (ref 0.0–3.0)
Eosinophils Absolute: 0.1 10*3/uL (ref 0.0–0.7)
Eosinophils Relative: 1 % (ref 0.0–5.0)
HCT: 36.7 % — ABNORMAL LOW (ref 39.0–52.0)
Hemoglobin: 11.8 g/dL — ABNORMAL LOW (ref 13.0–17.0)
Lymphocytes Relative: 23.9 % (ref 12.0–46.0)
Lymphs Abs: 1.2 10*3/uL (ref 0.7–4.0)
MCHC: 32.3 g/dL (ref 30.0–36.0)
MCV: 87.7 fl (ref 78.0–100.0)
Monocytes Absolute: 0.5 10*3/uL (ref 0.1–1.0)
Monocytes Relative: 10.1 % (ref 3.0–12.0)
Neutro Abs: 3.3 10*3/uL (ref 1.4–7.7)
Neutrophils Relative %: 64.5 % (ref 43.0–77.0)
Platelets: 172 10*3/uL (ref 150.0–400.0)
RBC: 4.18 Mil/uL — ABNORMAL LOW (ref 4.22–5.81)
RDW: 15.3 % — ABNORMAL HIGH (ref 11.5–14.6)
WBC: 5.1 10*3/uL (ref 4.5–10.5)

## 2011-04-10 LAB — BRAIN NATRIURETIC PEPTIDE: Pro B Natriuretic peptide (BNP): 425 pg/mL — ABNORMAL HIGH (ref 0.0–100.0)

## 2011-04-10 LAB — BASIC METABOLIC PANEL
BUN: 31 mg/dL — ABNORMAL HIGH (ref 6–23)
CO2: 24 mEq/L (ref 19–32)
Calcium: 9.6 mg/dL (ref 8.4–10.5)
Chloride: 109 mEq/L (ref 96–112)
Creatinine, Ser: 1.9 mg/dL — ABNORMAL HIGH (ref 0.4–1.5)
GFR: 44.6 mL/min — ABNORMAL LOW (ref >60.00)
Glucose, Bld: 185 mg/dL — ABNORMAL HIGH (ref 70–99)
Potassium: 4.2 mEq/L (ref 3.5–5.1)
Sodium: 140 mEq/L (ref 135–145)

## 2011-04-10 MED ORDER — FUROSEMIDE 40 MG PO TABS: 40.0000 mg | ORAL_TABLET | Freq: Every day | ORAL | Status: DC

## 2011-04-10 MED ORDER — AMOXICILLIN 500 MG PO CAPS: 500.0000 mg | ORAL_CAPSULE | Freq: Two times a day (BID) | ORAL | Status: AC

## 2011-04-10 NOTE — Assessment & Plan Note (Signed)
Patient presents with worsening DOE along with reports of fever, chills and wheezing. Weight is ok. Has had no labs or CXR. Has a known EF of 30 to 35%. I have started him on Lasix 40 mg a day. I am checking labs today and sending him for a CXR. We will update his echo. I have also given him Amoxicillin 500 mg BID for the next 7 days. He says he has a recall letter with Dr. Acie Fredrickson so we will go ahead and make him a follow up appointment today. He is to cut back on the salt intake. Further disposition to follow. Patient is agreeable to this plan and will call if any problems develop in the interim.

## 2011-04-10 NOTE — Patient Instructions (Addendum)
We are going to check some labs today.  I want you to go to [place] at [facility] [place] for a chest x ray today.  I am going to give you some Lasix 40 mg a day to help with fluid and some antibiotics for possible infection - amoxicillin 500 mg two times a day for a week.  Limit your salt.  We are going to recheck an ultrasound of your heart.  Needs recall appointment with Dr. Acie Fredrickson  Call the [facility] office at [phone] if you have any questions, problems or concerns.

## 2011-04-10 NOTE — Progress Notes (Signed)
Colin [name] Date of Birth: 11/02/1928 Medical Record [id number]  History of Present Illness: Colin [name] is seen today for a work in visit. He is seen for Colin [name]. He has an ischemic cardiomyopathy with an EF of 30 to 35%, remote CABG in 2004, functioning pacemaker, past tobacco abuse, CKD and HTN. He presents today with about a one week history of dyspnea with exertion. Says he is not really having much chest pain but does note some with deep inspiration. Has a cough. Has had some fevers and chills. No swelling. Seen by his PCP last week and was referred here. No records available at time of his visit. Has had extra salt from canned soup and [place] sausages. He says he has been taking his medicines.   Current Outpatient Prescriptions on File Prior to Visit  Medication Sig Dispense Refill  . amLODipine (NORVASC) 10 MG tablet Take 10 mg by mouth daily.        . carvedilol (COREG) 12.5 MG tablet Take 12.5 mg by mouth 2 (two) times daily with a meal.        . Cyanocobalamin (VITAMIN B 12 PO) Take by mouth. 1000 mcg daily       . ezetimibe (ZETIA) 10 MG tablet Take 10 mg by mouth daily.        . insulin lispro protamine-insulin lispro (HUMALOG 50/50) (50-50) 100 UNIT/ML SUSP Inject into the skin 2 (two) times daily before a meal. 30 units in the am and 15 in the pm       . L-Methylfolate-B6-B12 (METANX PO) Take by mouth 2 (two) times daily.        . Omega-3 Fatty Acids (FISH OIL) 1000 MG CAPS Take by mouth daily.        . silodosin (RAPAFLO) 8 MG CAPS capsule Take 8 mg by mouth once a week.        . SODIUM POLYSTYRENE SULFONATE PO Take by mouth. Orally prn         Allergies  Allergen Reactions  . Lisinopril     Elevated potassium,renal insuf    Past Medical History  Diagnosis Date  . Coronary artery disease     Status post CABG in 2004  . Peripheral vascular disease     Status post right femoropopliteal bypass  . Dyslipidemia   . Diabetes mellitus   . Hypertension   . Chronic  renal insufficiency     creatinine around 2.4  . CHF (congestive heart failure) 2007    ef 38%  . History of CVA (cerebrovascular accident)   . Cardiomyopathy   . COPD (chronic obstructive pulmonary disease)   . History of anemia     Past Surgical History  Procedure Date  . Insert / replace / remove pacemaker 05/17/2005    st. jude dual chamber ddd mode   . Coronary artery bypass graft 4 of 2004    4 vessel bypass  . Femoral bypass july 2006    right with right great toe amputation  . Cardiac catheterization 04/29/2002    Colin [name]    History  Smoking status  . Former Smoker  Smokeless tobacco  . Not on file    History  Alcohol Use No    History reviewed. No pertinent family history.  Review of Systems: The review of systems is positive for worsening DOE with cough, fever and chills. Notes some wheezing at times. His weight is basically unchanged.  All other systems were reviewed and are  negative.  Physical Exam: BP 148/78  Pulse 80  Ht 5\' 9"  (1.753 m)  Wt 197 lb (89.359 kg)  BMI 29.09 kg/m2 Patient is very pleasant and in no acute distress. Skin is warm and dry. Color is normal.  HEENT is unremarkable. Normocephalic/atraumatic. PERRL. Sclera are nonicteric. Neck is supple. No masses. No JVD. Lungs are clear. Cardiac exam shows a regular rate and rhythm. He does have frequent ectopics. Abdomen is soft. Extremities are without edema. Gait and ROM are intact. No gross neurologic deficits noted.   LABORATORY DATA: EKG here today shows pacing with bigeminy PVC's.    Assessment / Plan:

## 2011-04-10 NOTE — Assessment & Plan Note (Signed)
Rechecking labs today.

## 2011-04-18 ENCOUNTER — Other Ambulatory Visit: Payer: Medicare Other

## 2011-04-19 ENCOUNTER — Ambulatory Visit (HOSPITAL_COMMUNITY): Payer: Medicare Other | Attending: Cardiology

## 2011-04-26 ENCOUNTER — Telehealth: Payer: Self-pay | Admitting: *Deleted

## 2011-04-26 NOTE — Telephone Encounter (Signed)
Asked him to call back and reschedule echo/ pt said he will call back tomorrow, number provided.

## 2011-04-26 NOTE — Telephone Encounter (Signed)
Message copied by Jonathon Jordan on Wed Apr 26, 2011  5:12 PM ------      Message from: Mack Guise B      Created: Mon Apr 24, 2011  9:28 AM      Regarding: echo        04-19-11 no showed for echo /AUTH # [id number] EXP 05/25/11       Also labs/saf

## 2011-05-09 ENCOUNTER — Other Ambulatory Visit: Payer: Self-pay

## 2011-05-09 ENCOUNTER — Ambulatory Visit (HOSPITAL_COMMUNITY): Payer: Medicare Other | Attending: Cardiology

## 2011-05-09 DIAGNOSIS — I517 Cardiomegaly: Secondary | ICD-10-CM | POA: Insufficient documentation

## 2011-05-09 DIAGNOSIS — I2589 Other forms of chronic ischemic heart disease: Secondary | ICD-10-CM | POA: Insufficient documentation

## 2011-05-09 DIAGNOSIS — Z87891 Personal history of nicotine dependence: Secondary | ICD-10-CM | POA: Insufficient documentation

## 2011-05-09 DIAGNOSIS — J449 Chronic obstructive pulmonary disease, unspecified: Secondary | ICD-10-CM | POA: Insufficient documentation

## 2011-05-09 DIAGNOSIS — R0609 Other forms of dyspnea: Secondary | ICD-10-CM | POA: Insufficient documentation

## 2011-05-09 DIAGNOSIS — R0600 Dyspnea, unspecified: Secondary | ICD-10-CM

## 2011-05-09 DIAGNOSIS — E119 Type 2 diabetes mellitus without complications: Secondary | ICD-10-CM | POA: Insufficient documentation

## 2011-05-09 DIAGNOSIS — I059 Rheumatic mitral valve disease, unspecified: Secondary | ICD-10-CM | POA: Insufficient documentation

## 2011-05-09 DIAGNOSIS — R0602 Shortness of breath: Secondary | ICD-10-CM

## 2011-05-09 DIAGNOSIS — I5020 Unspecified systolic (congestive) heart failure: Secondary | ICD-10-CM

## 2011-05-09 DIAGNOSIS — I10 Essential (primary) hypertension: Secondary | ICD-10-CM | POA: Insufficient documentation

## 2011-05-09 DIAGNOSIS — J4489 Other specified chronic obstructive pulmonary disease: Secondary | ICD-10-CM | POA: Insufficient documentation

## 2011-05-09 DIAGNOSIS — R0989 Other specified symptoms and signs involving the circulatory and respiratory systems: Secondary | ICD-10-CM | POA: Insufficient documentation

## 2011-05-09 DIAGNOSIS — I2510 Atherosclerotic heart disease of native coronary artery without angina pectoris: Secondary | ICD-10-CM | POA: Insufficient documentation

## 2011-06-09 ENCOUNTER — Other Ambulatory Visit: Payer: Medicare Other

## 2011-06-09 ENCOUNTER — Ambulatory Visit (INDEPENDENT_AMBULATORY_CARE_PROVIDER_SITE_OTHER): Payer: Medicare Other | Admitting: Cardiovascular Disease

## 2011-06-09 ENCOUNTER — Encounter: Payer: Self-pay | Admitting: Cardiovascular Disease

## 2011-06-09 VITALS — BP 146/71 | HR 64 | Ht 68.0 in | Wt 188.2 lb

## 2011-06-09 DIAGNOSIS — I2510 Atherosclerotic heart disease of native coronary artery without angina pectoris: Secondary | ICD-10-CM

## 2011-06-09 DIAGNOSIS — I509 Heart failure, unspecified: Secondary | ICD-10-CM

## 2011-06-09 DIAGNOSIS — I501 Left ventricular failure: Secondary | ICD-10-CM

## 2011-06-09 DIAGNOSIS — R0602 Shortness of breath: Secondary | ICD-10-CM

## 2011-06-09 DIAGNOSIS — E785 Hyperlipidemia, unspecified: Secondary | ICD-10-CM

## 2011-06-09 LAB — BASIC METABOLIC PANEL
BUN: 60 mg/dL — ABNORMAL HIGH (ref 6–23)
Chloride: 106 mEq/L (ref 96–112)
Glucose, Bld: 209 mg/dL — ABNORMAL HIGH (ref 70–99)
Potassium: 4.9 mEq/L (ref 3.5–5.1)

## 2011-06-09 LAB — BRAIN NATRIURETIC PEPTIDE: Pro B Natriuretic peptide (BNP): 92 pg/mL (ref 0.0–100.0)

## 2011-06-09 LAB — HEPATIC FUNCTION PANEL
Bilirubin, Direct: 0.1 mg/dL (ref 0.0–0.3)
Total Bilirubin: 0.6 mg/dL (ref 0.3–1.2)

## 2011-06-09 LAB — LIPID PANEL
LDL Cholesterol: 110 mg/dL — ABNORMAL HIGH (ref 0–99)
Total CHOL/HDL Ratio: 5
VLDL: 17.8 mg/dL (ref 0.0–40.0)

## 2011-06-09 NOTE — Assessment & Plan Note (Addendum)
Mr. Rapier seems to be doing quite a bit better. He is tolerating the Lasix fairly well. He still eats a lot of salty foods.  I've advised him to stop eating some salty foods.  His echocardiogram reveals a moderate to severely reduced left ventricle systolic function with an EF of around 30-35%. I've informed him of the importance of statin with some salty foods at this gradually improved. I'll see him again in 3 months.  Addendum:  Jun 10, 2011 Labs have returned. BNP = 92 Lab Results  Component Value Date   CREATININE 2.4* 06/09/2011   Lab Results  Component Value Date   BUN 60* 06/09/2011   He was started on Lasix 40 mg a day.  His renal function has deteriated slightly although he has known CKD.  We will try reducing the lasix to 40 mg on Mon, Wed, Friday, Sat.  Recheck labs in 1 month

## 2011-06-09 NOTE — Patient Instructions (Signed)
Your physician recommends that you return for a FASTING lipid profile: TODAY AND IN 6 MONTHS  Your physician wants you to follow-up in: [place] will receive a reminder letter in the mail two months in advance. If you don't receive a letter, please call our office to schedule the follow-up appointment.

## 2011-06-09 NOTE — Progress Notes (Signed)
Colin Date of Birth  [name]       [facility] [address], [place], [place]  [address]   [place], [place]  [address] [phone]     [phone]   Fax  [phone]    Fax [phone]  Problem List: 1. Coronary artery disease-status post CABG 2004 2. Congestive heart failure-EF of 35% 3. Peripheral vascular disease 4. Dyslipidemia 5. Diabetes mellitus 6. Hypertension 10. Pacemaker 8. Chronic renal insufficiency with baseline creatinine around 2.4  History of Present Illness:  Colin [name] is seen today for a work in visit. He has an ischemic cardiomyopathy with an EF of 30 to 35%, remote CABG in 2004, functioning pacemaker, past tobacco abuse, CKD and HTN. He last month with a one week history of dyspnea with exertion. Says he is not really having much chest pain but does note some with deep inspiration. Has a cough. Has had some fevers and chills. No swelling. Seen by his PCP last week and was referred here. No records available at time of his visit. Has had extra salt from  Sausages and occasional  hot dogs He says he has been taking his medicines.  He was seen by Cecille Rubin. She started him on Lasix.  He has developed a cough that sounds like hay fever and allergies to pollen.  Current Outpatient Prescriptions on File Prior to Visit  Medication Sig Dispense Refill  . carvedilol (COREG) 12.5 MG tablet Take 12.5 mg by mouth 2 (two) times daily with a meal.        . Cyanocobalamin (VITAMIN B 12 PO) Take by mouth. 1000 mcg daily       . ezetimibe (ZETIA) 10 MG tablet Take 10 mg by mouth daily.        . furosemide (LASIX) 40 MG tablet Take 1 tablet (40 mg total) by mouth daily.  30 tablet  11  . insulin lispro protamine-insulin lispro (HUMALOG 50/50) (50-50) 100 UNIT/ML SUSP Inject into the skin 2 (two) times daily before a meal. 30 units in the am and 15 in the pm       . L-Methylfolate-B6-B12 (METANX  PO) Take by mouth 2 (two) times daily.        . Omega-3 Fatty Acids (FISH OIL) 1000 MG CAPS Take by mouth daily.        . SODIUM POLYSTYRENE SULFONATE PO Take by mouth. Orally prn         Allergies  Allergen Reactions  . Lisinopril     Elevated potassium,renal insuf    Past Medical History  Diagnosis Date  . Coronary artery disease     Status post CABG in 2004  . Peripheral vascular disease     Status post right femoropopliteal bypass  . Dyslipidemia   . Diabetes mellitus   . Hypertension   . Chronic renal insufficiency     creatinine around 2.4  . CHF (congestive heart failure) 2007    ef 38%  . History of CVA (cerebrovascular accident)   . Cardiomyopathy   . COPD (chronic obstructive pulmonary disease)   . History of anemia     Past Surgical History  Procedure Date  . Insert / replace / remove pacemaker 05/17/2005    st. jude dual chamber ddd mode   . Coronary artery bypass graft 4 of 2004    4 vessel bypass  . Femoral bypass july 2006  right with right great toe amputation  . Cardiac catheterization 04/29/2002    Dr. Glade Lloyd    History  Smoking status  . Former Smoker  . Quit date: 06/09/2002  Smokeless tobacco  . Not on file    History  Alcohol Use No    No family history on file.  Reviw of Systems:  Reviewed in the HPI.  All other systems are negative.  Physical Exam: Blood pressure 146/71, pulse 64, weight 188 lb 3.2 oz (85.367 kg). General: Well developed, well nourished, in no acute distress.  Head: Normocephalic, atraumatic, sclera non-icteric, mucus membranes are moist,   Neck: Supple. Carotids are 2 + without bruits. No JVD  Lungs: Clear bilaterally to auscultation.  Heart: regular rate.  normal  S1 S2. No murmurs, gallops or rubs.  Abdomen: Soft, non-tender, non-distended with normal bowel sounds. No hepatomegaly. No rebound/guarding. No masses.  Msk:  Strength and tone are normal  Extremities: No clubbing or cyanosis. No edema.   Distal pedal pulses are 2+ and equal bilaterally.  Neuro: Alert and oriented X 3. Moves all extremities spontaneously.  Psych:  Responds to questions appropriately with a normal affect.  ECG:  Assessment / Plan:

## 2011-06-12 ENCOUNTER — Telehealth: Payer: Self-pay | Admitting: *Deleted

## 2011-06-12 MED ORDER — FUROSEMIDE 40 MG PO TABS: ORAL_TABLET | ORAL | Status: DC

## 2011-06-12 NOTE — Telephone Encounter (Signed)
Med dose called to pt and lab redraw set.

## 2011-06-12 NOTE — Telephone Encounter (Signed)
Message copied by Jonathon Jordan on Mon Jun 12, 2011  5:31 PM ------      Message from: Thayer Headings      Created: Sat Jun 10, 2011  7:25 AM             Addendum:  Jun 10, 2011      Labs have returned.      BNP = 92      Lab Results      Component Value Date       CREATININE 2.4# 06/09/2011            Lab Results      Component Value Date       BUN 60# 06/09/2011            He was started on Lasix 40 mg a day.  His renal function has deteriated slightly although he has known CKD.  We will try reducing the lasix to 40 mg on Mon, Wed, Friday, Sat.  Recheck labs in 1 month

## 2011-06-23 ENCOUNTER — Encounter: Payer: Self-pay | Admitting: Cardiovascular Disease

## 2011-07-04 ENCOUNTER — Encounter: Payer: Self-pay | Admitting: Cardiovascular Disease

## 2011-07-12 ENCOUNTER — Other Ambulatory Visit (INDEPENDENT_AMBULATORY_CARE_PROVIDER_SITE_OTHER): Payer: Medicare Other

## 2011-07-12 DIAGNOSIS — R001 Bradycardia, unspecified: Secondary | ICD-10-CM

## 2011-07-12 DIAGNOSIS — I509 Heart failure, unspecified: Secondary | ICD-10-CM

## 2011-07-12 DIAGNOSIS — I498 Other specified cardiac arrhythmias: Secondary | ICD-10-CM

## 2011-07-12 LAB — BASIC METABOLIC PANEL
BUN: 54 mg/dL — ABNORMAL HIGH (ref 6–23)
Chloride: 108 mEq/L (ref 96–112)
Glucose, Bld: 180 mg/dL — ABNORMAL HIGH (ref 70–99)
Potassium: 5.2 mEq/L — ABNORMAL HIGH (ref 3.5–5.1)

## 2011-07-21 ENCOUNTER — Ambulatory Visit (HOSPITAL_COMMUNITY)
Admission: RE | Admit: 2011-07-21 | Discharge: 2011-07-21 | Disposition: A | Discharge status: Home / Self Care | Payer: Medicare Other | Source: Ambulatory Visit | Attending: Gastroenterology | Admitting: Gastroenterology

## 2011-07-21 ENCOUNTER — Encounter (HOSPITAL_COMMUNITY)
Admission: RE | Disposition: A | Discharge status: Home / Self Care | Payer: Self-pay | Source: Ambulatory Visit | Attending: Gastroenterology

## 2011-07-21 ENCOUNTER — Encounter (HOSPITAL_COMMUNITY): Payer: Self-pay | Admitting: *Deleted

## 2011-07-21 DIAGNOSIS — R1310 Dysphagia, unspecified: Secondary | ICD-10-CM | POA: Insufficient documentation

## 2011-07-21 DIAGNOSIS — K2940 Chronic atrophic gastritis without bleeding: Secondary | ICD-10-CM | POA: Insufficient documentation

## 2011-07-21 DIAGNOSIS — K319 Disease of stomach and duodenum, unspecified: Secondary | ICD-10-CM | POA: Insufficient documentation

## 2011-07-21 HISTORY — PX: ESOPHAGOGASTRODUODENOSCOPY: SHX5428

## 2011-07-21 SURGERY — EGD (ESOPHAGOGASTRODUODENOSCOPY)
Anesthesia: Moderate Sedation

## 2011-07-21 MED ORDER — MIDAZOLAM HCL 10 MG/2ML IJ SOLN
INTRAMUSCULAR | Status: DC | PRN
  Administered 2011-07-21: 1 mg via INTRAVENOUS
  Administered 2011-07-21: 2 mg via INTRAVENOUS

## 2011-07-21 MED ORDER — DIPHENHYDRAMINE HCL 50 MG/ML IJ SOLN
INTRAMUSCULAR | Status: AC
  Filled 2011-07-21: qty 1

## 2011-07-21 MED ORDER — BUTAMBEN-TETRACAINE-BENZOCAINE 2-2-14 % EX AERO
INHALATION_SPRAY | CUTANEOUS | Status: DC | PRN
  Administered 2011-07-21: 2 via TOPICAL

## 2011-07-21 MED ORDER — MIDAZOLAM HCL 10 MG/2ML IJ SOLN
INTRAMUSCULAR | Status: AC
  Filled 2011-07-21: qty 2

## 2011-07-21 MED ORDER — FENTANYL CITRATE 0.05 MG/ML IJ SOLN
INTRAMUSCULAR | Status: AC
  Filled 2011-07-21: qty 2

## 2011-07-21 MED ORDER — SODIUM CHLORIDE 0.9 % IV SOLN: Freq: Once | INTRAVENOUS | Status: DC

## 2011-07-21 MED ORDER — FENTANYL CITRATE 0.05 MG/ML IJ SOLN
INTRAMUSCULAR | Status: DC | PRN
  Administered 2011-07-21: 25 ug via INTRAVENOUS

## 2011-07-21 NOTE — H&P (Signed)
  Reason for Consult: Dysphagia Referring Physician: Carol Ada, M.D.  [name] Gusman HPI: This is an 78 year old patient with multiple medical problems with complaints of dysphagia to both solids and liquids.  He underwent evaluation with an EGD in the past and it was negative for any abnormalities, however, with his current symptoms he reports a 13 lbs weight loss.  Past Medical History  Diagnosis Date  . Coronary artery disease     Status post CABG in 2004  . Peripheral vascular disease     Status post right femoropopliteal bypass  . Dyslipidemia   . Diabetes mellitus   . Hypertension   . Chronic renal insufficiency     creatinine around 2.4  . CHF (congestive heart failure) 2007    ef 38%  . History of CVA (cerebrovascular accident)   . Cardiomyopathy   . COPD (chronic obstructive pulmonary disease)   . History of anemia     Past Surgical History  Procedure Date  . Insert / replace / remove pacemaker 05/17/2005    st. jude dual chamber ddd mode   . Coronary artery bypass graft 4 of 2004    4 vessel bypass  . Femoral bypass july 2006    right with right great toe amputation  . Cardiac catheterization 04/29/2002    Dr. Glade Lloyd    History reviewed. No pertinent family history.  Social History:  reports that he quit smoking about 9 years ago. He does not have any smokeless tobacco history on file. He reports that he does not drink alcohol or use illicit drugs.  Allergies:  Allergies  Allergen Reactions  . Lisinopril     Elevated potassium,renal insuf    Medications:  Scheduled:   . sodium chloride   Intravenous Once   Continuous:   No results found for this or any previous visit (from the past 24 hour(s)).   No results found.  ROS:  As stated above in the HPI otherwise negative.  Blood pressure 151/70, temperature 97.7 F (36.5 C), temperature source Oral, resp. rate 19, height 5\' 11"  (1.803 m), weight 85.276 kg (188 lb), SpO2 99.00%.    PE: Gen:  NAD, Alert and Oriented HEENT:  [place]/AT, EOMI Neck: Supple, no LAD Lungs: CTA Bilaterally CV: RRR without M/G/R ABM: Soft, NTND, +BS Ext: No C/C/E  Assessment/Plan: 1) Dysphagia. 2) Weight loss.   I believe the patient's symptoms are motility in origin, but with the weight loss an EGD is prudent.  Plan: 1) EGD today.  Morgann Woodburn D 07/21/2011, 9:02 AM

## 2011-07-21 NOTE — Discharge Instructions (Addendum)
Endoscopy Care After Please read the instructions outlined below and refer to this sheet in the next few weeks. These discharge instructions provide you with general information on caring for yourself after you leave the hospital. Your doctor may also give you specific instructions. While your treatment has been planned according to the most current medical practices available, unavoidable complications occasionally occur. If you have any problems or questions after discharge, please call your doctor. HOME CARE INSTRUCTIONS Activity  You may resume your regular activity but move at a slower pace for the next 24 hours.   Take frequent rest periods for the next 24 hours.   Walking will help expel (get rid of) the air and reduce the bloated feeling in your abdomen.   No driving for 24 hours (because of the anesthesia (medicine) used during the test).   You may shower.   Do not sign any important legal documents or operate any machinery for 24 hours (because of the anesthesia used during the test).  Nutrition  Drink plenty of fluids.   You may resume your normal diet.   Begin with a light meal and progress to your normal diet.   Avoid alcoholic beverages for 24 hours or as instructed by your caregiver.  Medications You may resume your normal medications unless your caregiver tells you otherwise. What you can expect today  You may experience abdominal discomfort such as a feeling of fullness or "gas" pains.   You may experience a sore throat for 2 to 3 days. This is normal. Gargling with salt water may help this.  Follow-up Your doctor will discuss the results of your test with you. SEEK IMMEDIATE MEDICAL CARE IF:  You have excessive nausea (feeling sick to your stomach) and/or vomiting.   You have severe abdominal pain and distention (swelling).   You have trouble swallowing.   You have a temperature over 100 F (37.8 C).   You have rectal bleeding or vomiting of blood.    Document Released: 08/24/2003 Document Revised: 12/29/2010 Document Reviewed: 03/06/2007 Exit[employer] Patient Information 2012 [employer]. 

## 2011-07-21 NOTE — Op Note (Signed)
[facility] [place], [place]  [address]  OPERATIVE PROCEDURE REPORT  PATIENT:  Colin [name], Colin [name]  MR#:  [id number] BIRTHDATE:  02-09-1928  GENDER:  male ENDOSCOPIST:  Carol Ada, MD ASSISTANT:  Blase Mess and Luanne Bras, RN CGRN PROCEDURE DATE:  07/21/2011 PROCEDURE:  EGD with biopsy, [id number] ASA CLASS:  Class III INDICATIONS:  Dysphagia MEDICATIONS:  Fentanyl 25 mcg IV, Versed 3 mg IV TOPICAL ANESTHETIC:  Cetacaine Spray  DESCRIPTION OF PROCEDURE:   After the risks benefits and alternatives of the procedure were thoroughly explained, informed consent was obtained.  The Pentax Gastroscope [id number] endoscope was introduced through the mouth and advanced to the second portion of the duodenum, without limitations.  The instrument was slowly withdrawn as the mucosa was fully examined. <<PROCEDUREIMAGES>>  FINDINGS:  No evidence of esophagitis or strictures in the esophagus. Random biopsies were obtained for EoE in the upper and mid esophagus. In the gastric fundus a moderate gastritis was identified and biopsied. No other abnormalities identified. Retroflexed views revealed no abnormalities.    The scope was then withdrawn from the patient and the procedure terminated.  COMPLICATIONS:  None  IMPRESSION:  1) Moderate gastritis RECOMMENDATIONS:  1) Await biopsy results 2) Follow up in the office in one month.  ______________________________ Carol Ada, MD  n. Lorrin MaisCarol Ada at 07/21/2011 09:29 AM  Haskel Schroeder, [id number]

## 2011-08-17 ENCOUNTER — Encounter: Payer: Self-pay | Admitting: Cardiology

## 2011-08-17 ENCOUNTER — Ambulatory Visit (INDEPENDENT_AMBULATORY_CARE_PROVIDER_SITE_OTHER): Payer: Medicare Other | Admitting: Cardiology

## 2011-08-17 ENCOUNTER — Encounter: Payer: Self-pay | Admitting: Internal Medicine

## 2011-08-17 VITALS — BP 129/50 | HR 44 | Ht 71.0 in | Wt 188.8 lb

## 2011-08-17 DIAGNOSIS — I493 Ventricular premature depolarization: Secondary | ICD-10-CM

## 2011-08-17 DIAGNOSIS — I4949 Other premature depolarization: Secondary | ICD-10-CM

## 2011-08-17 DIAGNOSIS — Z950 Presence of cardiac pacemaker: Secondary | ICD-10-CM

## 2011-08-17 NOTE — Progress Notes (Signed)
ELECTROPHYSIOLOGY OFFICE NOTE  Patient ID: Colin [name] Wible MRN: [id number], DOB/AGE: Nov 25, 1928   Date of Visit: 08/17/2011  Primary Physician: Horatio Pel, MD Primary Cardiologist: Acie Fredrickson, MD Reason for Visit: Device follow-up  History of Present Illness [name] is a pleasant 78 year old gentleman with an ishemic CM, CAD s/p CABG, SSS s/p PPM implantation in 2007 by Dr. Glade Lloyd, chronic systolic CHF, HTN, DM, PVD and CKD who presents today for device follow-up. He denies any complaints. He specifically denies CP, SOB, palpitations, dizziness, near syncope or syncope. He denies LE swelling.   Past Medical History  Diagnosis Date  . Coronary artery disease     Status post CABG in 2004  . Peripheral vascular disease     Status post right femoropopliteal bypass  . Dyslipidemia   . Diabetes mellitus   . Hypertension   . Chronic renal insufficiency     creatinine around 2.4  . CHF (congestive heart failure) 2007    ef 38%  . History of CVA (cerebrovascular accident)   . Cardiomyopathy   . COPD (chronic obstructive pulmonary disease)   . History of anemia     Past Surgical History  Procedure Date  . Insert / replace / remove pacemaker 05/17/2005    st. jude dual chamber ddd mode   . Coronary artery bypass graft 4 of 2004    4 vessel bypass  . Femoral bypass july 2006    right with right great toe amputation  . Cardiac catheterization 04/29/2002    Dr. Glade Lloyd  . Esophagogastroduodenoscopy 07/21/2011    Procedure: ESOPHAGOGASTRODUODENOSCOPY (EGD);  Surgeon: Beryle Beams, MD;  Location: [facility];  Service: Endoscopy;  Laterality: N/A;    Allergies/Intolerances Allergies  Allergen Reactions  . Lisinopril     Elevated potassium,renal insuf   Current Home Medications Current Outpatient Prescriptions  Medication Sig Dispense Refill  . aspirin 81 MG tablet Take 81 mg by mouth daily.      . carvedilol (COREG) 12.5 MG tablet Take 12.5 mg by mouth 2 (two)  times daily with a meal.        . Cyanocobalamin (VITAMIN B 12 PO) Take by mouth. 1000 mcg daily       . ezetimibe (ZETIA) 10 MG tablet Take 10 mg by mouth daily.        . furosemide (LASIX) 40 MG tablet Take 1 tablet Monday Wednesday Friday and Saturday.  30 tablet  11  . insulin lispro protamine-insulin lispro (HUMALOG 50/50) (50-50) 100 UNIT/ML SUSP Inject into the skin 2 (two) times daily before a meal. 30 units in the am and 15 in the pm       . L-Methylfolate-B6-B12 (METANX PO) Take by mouth 2 (two) times daily.        . Omega-3 Fatty Acids (FISH OIL) 1000 MG CAPS Take by mouth daily.        . SODIUM POLYSTYRENE SULFONATE PO Take by mouth. Orally prn       . Tamsulosin HCl (FLOMAX) 0.4 MG CAPS Take 0.4 mg by mouth daily.       Social History Social History  . Marital Status: Married   Social History Main Topics  . Smoking status: Former Smoker    Quit date: 06/09/2002  . Smokeless tobacco: Never Used  . Alcohol Use: No  . Drug Use: No   Review of Systems General:  No chills, fever, night sweats or weight changes Cardiovascular:  No chest pain, dyspnea on exertion, edema,  orthopnea, palpitations, paroxysmal nocturnal dyspnea Dermatological: No rash, lesions or masses Respiratory: No cough, dyspnea Urologic: No hematuria, dysuria Abdominal:   No nausea, vomiting, diarrhea, bright red blood per rectum, melena, or hematemesis Neurologic:  No visual changes, weakness, changes in mental status All other systems reviewed and are otherwise negative except as noted above.  Physical Exam Blood pressure 129/50, pulse 44, height 5\' 11"  (1.803 m), weight 188 lb 12.8 oz (85.639 kg), SpO2 95.00%.  General: Well developed, elderly appearing 78 year old male in no acute distress. HEENT: Normocephalic, atraumatic. EOMs intact. Sclera nonicteric. Oropharynx clear.  Neck: Supple. No JVD. Lungs:  Respirations regular and unlabored, CTA bilaterally. No wheezes, rales or rhonchi. Heart: RRR. S1,  S2 present. No murmurs, rub, S3 or S4. Abdomen: Soft, non-distended.  Extremities: No clubbing, cyanosis or edema. DP/PT/Radials 2+ and equal bilaterally. Psych: Normal affect. Neuro: Alert and oriented X 3. Moves all extremities spontaneously.   Diagnostics Device interrogation today shows normal PPM function with good battery status and stable lead measurements/parameters; no programming changes made; see PaceArt report  Assessment and Plan 1. Sinus node dysfunction s/p PPM - device function normal; patient's PPM is not compatible with remote monitoring; he is due back for follow-up with Dr. Lovena Le in 6 months unless needed sooner 2. PVCs - asymptomatic; if increased in frequency or become symptomatic, would consider up-titrating his carvedilol; will defer to his primary cardiologist, Dr. Acie Fredrickson, in follow-up  Signed, Ileene Hutchinson, PA-C 08/17/2011, 3:49 PM

## 2011-08-17 NOTE — Patient Instructions (Signed)
Your physician wants you to follow-up in: 6 months with device clinic.  You will receive a reminder letter in the mail two months in advance. If you don't receive a letter, please call our office to schedule the follow-up appointment.

## 2011-08-18 LAB — PACEMAKER DEVICE OBSERVATION
AL IMPEDENCE PM: 450 Ohm
ATRIAL PACING PM: 65
BAMS-0003: 70 {beats}/min
BATTERY VOLTAGE: 2.8 V
RV LEAD THRESHOLD: 0.5 V

## 2011-10-26 ENCOUNTER — Observation Stay (HOSPITAL_COMMUNITY)
Admission: EM | Admit: 2011-10-26 | Discharge: 2011-10-27 | Disposition: A | Discharge status: Home / Self Care | Payer: Medicare Other | Attending: Emergency Medicine | Admitting: Emergency Medicine

## 2011-10-26 ENCOUNTER — Emergency Department (HOSPITAL_COMMUNITY): Payer: Medicare Other

## 2011-10-26 ENCOUNTER — Encounter (HOSPITAL_COMMUNITY): Payer: Self-pay

## 2011-10-26 DIAGNOSIS — I2510 Atherosclerotic heart disease of native coronary artery without angina pectoris: Secondary | ICD-10-CM | POA: Insufficient documentation

## 2011-10-26 DIAGNOSIS — Z8673 Personal history of transient ischemic attack (TIA), and cerebral infarction without residual deficits: Secondary | ICD-10-CM | POA: Insufficient documentation

## 2011-10-26 DIAGNOSIS — I739 Peripheral vascular disease, unspecified: Secondary | ICD-10-CM | POA: Insufficient documentation

## 2011-10-26 DIAGNOSIS — Z794 Long term (current) use of insulin: Secondary | ICD-10-CM | POA: Insufficient documentation

## 2011-10-26 DIAGNOSIS — I10 Essential (primary) hypertension: Secondary | ICD-10-CM | POA: Insufficient documentation

## 2011-10-26 DIAGNOSIS — E162 Hypoglycemia, unspecified: Secondary | ICD-10-CM

## 2011-10-26 DIAGNOSIS — N289 Disorder of kidney and ureter, unspecified: Secondary | ICD-10-CM | POA: Insufficient documentation

## 2011-10-26 DIAGNOSIS — E785 Hyperlipidemia, unspecified: Secondary | ICD-10-CM | POA: Insufficient documentation

## 2011-10-26 DIAGNOSIS — Z950 Presence of cardiac pacemaker: Secondary | ICD-10-CM | POA: Insufficient documentation

## 2011-10-26 DIAGNOSIS — J449 Chronic obstructive pulmonary disease, unspecified: Secondary | ICD-10-CM | POA: Insufficient documentation

## 2011-10-26 DIAGNOSIS — J4489 Other specified chronic obstructive pulmonary disease: Secondary | ICD-10-CM | POA: Insufficient documentation

## 2011-10-26 DIAGNOSIS — E1169 Type 2 diabetes mellitus with other specified complication: Principal | ICD-10-CM | POA: Insufficient documentation

## 2011-10-26 LAB — CBC WITH DIFFERENTIAL/PLATELET
Basophils Absolute: 0 10*3/uL (ref 0.0–0.1)
Eosinophils Absolute: 0.1 10*3/uL (ref 0.0–0.7)
Eosinophils Relative: 1 % (ref 0–5)
Lymphs Abs: 1.1 10*3/uL (ref 0.7–4.0)
MCH: 27.7 pg (ref 26.0–34.0)
MCV: 86.6 fL (ref 78.0–100.0)
Neutrophils Relative %: 75 % (ref 43–77)
Platelets: 214 10*3/uL (ref 150–400)
RBC: 4.48 MIL/uL (ref 4.22–5.81)
RDW: 14 % (ref 11.5–15.5)
WBC: 6.2 10*3/uL (ref 4.0–10.5)

## 2011-10-26 LAB — BASIC METABOLIC PANEL
Calcium: 10.2 mg/dL (ref 8.4–10.5)
GFR calc Af Amer: 35 mL/min — ABNORMAL LOW (ref >90)
GFR calc non Af Amer: 30 mL/min — ABNORMAL LOW (ref >90)
Glucose, Bld: 77 mg/dL (ref 70–99)
Potassium: 4.1 mEq/L (ref 3.5–5.1)
Sodium: 141 mEq/L (ref 135–145)

## 2011-10-26 LAB — GLUCOSE, CAPILLARY
Glucose-Capillary: 73 mg/dL (ref 70–99)
Glucose-Capillary: 163 mg/dL — ABNORMAL HIGH (ref 70–99)
Glucose-Capillary: 214 mg/dL — ABNORMAL HIGH (ref 70–99)
Glucose-Capillary: 323 mg/dL — ABNORMAL HIGH (ref 70–99)

## 2011-10-26 MED ORDER — ONDANSETRON HCL 4 MG/2ML IJ SOLN: 4.0000 mg | Freq: Four times a day (QID) | INTRAMUSCULAR | Status: DC | PRN

## 2011-10-26 MED ORDER — DEXTROSE 50 % IV SOLN
INTRAVENOUS | Status: AC
  Filled 2011-10-26: qty 50

## 2011-10-26 NOTE — ED Provider Notes (Addendum)
History     CSN: [id number]  Arrival date & time 10/26/11  1516   First MD Initiated Contact with Patient 10/26/11 1531      Chief Complaint  Patient presents with  . Hypoglycemia    known diabetic on oral hypoglycemics and insulin; hypoglycemic PTA - 33 mg/dl; given 1 amp D50 - repeat glucose 234; also had Peanut butter and jelly sandwich PTA     (Consider location/radiation/quality/duration/timing/severity/associated sxs/prior treatment) The history is provided by the patient and the EMS personnel.   and patient is an 78 year old male, primary care Dr. is Dr. Shelia Media, brought in by EMS. Patient around lunchtime was noted to be confused by family members EMS was called blood sugar was noted to be 33 EMS gave patient one amp of D50 patient's family also was a having him eat a peanut butter and jelly sandwich. Blood sugar went up to 34. Hearing ED dropped back down to 70 mental status did not change. Patient has no specific complaints. Patient's best medical history significant for coronary artery disease peripheral vascular disease diabetes hypertension renal insufficiency and a cardiac pacemaker.  Past Medical History  Diagnosis Date  . Coronary artery disease     Status post CABG in 2004  . Peripheral vascular disease     Status post right femoropopliteal bypass  . Dyslipidemia   . Diabetes mellitus   . Hypertension   . Chronic renal insufficiency     creatinine around 2.4  . CHF (congestive heart failure) 2007    ef 38%  . History of CVA (cerebrovascular accident)   . Cardiomyopathy   . COPD (chronic obstructive pulmonary disease)   . History of anemia     Past Surgical History  Procedure Date  . Insert / replace / remove pacemaker 05/17/2005    st. jude dual chamber ddd mode   . Coronary artery bypass graft 4 of 2004    4 vessel bypass  . Femoral bypass july 2006    right with right great toe amputation  . Cardiac catheterization 04/29/2002    Dr. Glade Lloyd  .  Esophagogastroduodenoscopy 07/21/2011    Procedure: ESOPHAGOGASTRODUODENOSCOPY (EGD);  Surgeon: Beryle Beams, MD;  Location: [facility];  Service: Endoscopy;  Laterality: N/A;    No family history on file.  History  Substance Use Topics  . Smoking status: Former Smoker    Quit date: 06/09/2002  . Smokeless tobacco: Never Used  . Alcohol Use: No      Review of Systems  Constitutional: Negative for fever.  HENT: Negative for congestion and neck pain.   Eyes: Negative for visual disturbance.  Respiratory: Negative for shortness of breath.   Cardiovascular: Negative for chest pain.  Gastrointestinal: Negative for nausea, vomiting, abdominal pain and diarrhea.  Genitourinary: Negative for dysuria.  Musculoskeletal: Negative for back pain.  Skin: Negative for rash.  Neurological: Negative for headaches.  Hematological: Does not bruise/bleed easily.    Allergies  Lisinopril  Home Medications   Current Outpatient Rx  Name Route Sig Dispense Refill  . B COMPLEX-C PO TABS Oral Take 1 tablet by mouth daily.    Marland Kitchen CARVEDILOL 12.5 MG PO TABS Oral Take 12.5 mg by mouth 2 (two) times daily with a meal.      . VITAMIN B 12 PO Oral Take by mouth. 1000 mcg daily     . EZETIMIBE 10 MG PO TABS Oral Take 10 mg by mouth daily.      . FUROSEMIDE 40 MG PO  TABS Oral Take 40 mg by mouth daily. Take 1 tablet Monday Wednesday Friday and Saturday.    . INSULIN LISPRO PROT & LISPRO (50-50) 100 UNIT/ML [place] SUSP Subcutaneous Inject 15-30 Units into the skin 2 (two) times daily before a meal. 30 units in the am and 15 in the pm    . FISH OIL 1000 MG PO CAPS Oral Take 1,000 mg by mouth daily.     Marland Kitchen OMEPRAZOLE 40 MG PO CPDR Oral Take 40 mg by mouth daily as needed. Acid indigestion    . TAMSULOSIN HCL 0.4 MG PO CAPS Oral Take 0.4 mg by mouth daily.      BP 171/72  Pulse 68  Temp 97.4 F (36.3 C) (Oral)  Resp 16  SpO2 100%  Physical Exam  Nursing note and vitals reviewed. Constitutional: He is  oriented to person, place, and time. He appears well-developed and well-nourished. No distress.  HENT:  Head: Normocephalic and atraumatic.  Eyes: Conjunctivae normal and EOM are normal. Pupils are equal, round, and reactive to light.  Neck: Normal range of motion. Neck supple.  Cardiovascular: Normal rate, regular rhythm and normal heart sounds.   No murmur heard. Pulmonary/Chest: Effort normal and breath sounds normal. He has no wheezes.  Abdominal: Soft. Bowel sounds are normal. There is no tenderness.  Neurological: He is alert and oriented to person, place, and time. No cranial nerve deficit. He exhibits normal muscle tone. Coordination normal.  Skin: Skin is warm. No rash noted.    ED Course  Procedures (including critical care time)   Labs Reviewed  GLUCOSE, CAPILLARY  CBC WITH DIFFERENTIAL  BASIC METABOLIC PANEL   No results found.   1. Hypoglycemia       MDM   Patient is a known diabetic normally takes his insulin before breakfast and before bed. Significant blood sugar drop right before lunch eyes reach out. Then in the emergency part patient may have may be taking too much insulin the morning or maybe is not enough to take. Because of the drop of blood sugar back down to 70 after the one amp of D50 patient's being fed currently started on a D5 drip will be moved to the CDU and we'll require further observation until the blood sugar stabilizes out. If it does not he'll require admission.  CDU hypoglycemia observation orders placed.  Medical screening examination/treatment/procedure(s) were conducted as a shared visit with non-physician practitioner(s) and myself.  I personally evaluated the patient during the encounter        Mervin Kung, MD 10/26/11 [place], MD 10/29/11 2158

## 2011-10-26 NOTE — ED Notes (Signed)
1000 cc D5 1/2 NS hung to infuse at [facility] rate

## 2011-10-26 NOTE — ED Notes (Signed)
Repeat glucose 138 mg/dl

## 2011-10-26 NOTE — ED Notes (Signed)
Glucose currently 73 mg/dl

## 2011-10-26 NOTE — ED Provider Notes (Signed)
Medical screening examination/treatment/procedure(s) were conducted as a shared visit with non-physician practitioner(s) and myself.  I personally evaluated the patient during the encounter   Mervin Kung, MD 10/26/11 2258

## 2011-10-26 NOTE — ED Notes (Signed)
Pt transferred to CDU for hypoglycemia protocol, last glucose 138.  Pt alert and oriented at this time with no complaints.

## 2011-10-26 NOTE — ED Provider Notes (Signed)
6:40 PM Patient is in CDU under observation, hypoglycemia protocol.  This is a shared visit with Dr Rogene Houston.  Sign out received from Dr Rogene Houston.  Pt reported to have eaten very little today but took his regular insulin, became hypoglycemic.  Pt has improved here, has eaten.  Pt was started on D50.  Plan is to stop D50, continue to feed patient and monitor blood glucose.  Once patient is stable x several hours, plan is for d/c home.  Pt and family aware of this plan.  Pt reports he is feeling great, no complaints or needs at this time.  Pt is A&O, NAD, RRR, CTAB, abd soft, NT.  CBG on stopping D50 was 214 at 1823hrs.  Will continue to monitor.   10:21 PM Blood glucose has now been elevated for 4 hours.  Pt to be discharged home.  Pt states he has an appointment with his PCP in 4 days.  Pt strongly advised to monitor his blood sugar closely, and to make sure he eats a full meal when he takes his insulin.  Pt to be given return precautions.  Pt verbalizes understanding and agrees with plan.    Results for orders placed during the hospital encounter of 10/26/11  GLUCOSE, CAPILLARY      Component Value Range   Glucose-Capillary 73  70 - 99 mg/dL   Comment 1 Notify RN     Comment 2 Documented in Chart    CBC WITH DIFFERENTIAL      Component Value Range   WBC 6.2  4.0 - 10.5 K/uL   RBC 4.48  4.22 - 5.81 MIL/uL   Hemoglobin 12.4 (*) 13.0 - 17.0 g/dL   HCT 38.8 (*) 39.0 - 52.0 %   MCV 86.6  78.0 - 100.0 fL   MCH 27.7  26.0 - 34.0 pg   MCHC 32.0  30.0 - 36.0 g/dL   RDW 14.0  11.5 - 15.5 %   Platelets 214  150 - 400 K/uL   Neutrophils Relative 75  43 - 77 %   Neutro Abs 4.6  1.7 - 7.7 K/uL   Lymphocytes Relative 17  12 - 46 %   Lymphs Abs 1.1  0.7 - 4.0 K/uL   Monocytes Relative 7  3 - 12 %   Monocytes Absolute 0.4  0.1 - 1.0 K/uL   Eosinophils Relative 1  0 - 5 %   Eosinophils Absolute 0.1  0.0 - 0.7 K/uL   Basophils Relative 0  0 - 1 %   Basophils Absolute 0.0  0.0 - 0.1 K/uL  BASIC  METABOLIC PANEL      Component Value Range   Sodium 141  135 - 145 mEq/L   Potassium 4.1  3.5 - 5.1 mEq/L   Chloride 106  96 - 112 mEq/L   CO2 24  19 - 32 mEq/L   Glucose, Bld 77  70 - 99 mg/dL   BUN 35 (*) 6 - 23 mg/dL   Creatinine, Ser 1.98 (*) 0.50 - 1.35 mg/dL   Calcium 10.2  8.4 - 10.5 mg/dL   GFR calc non Af Amer 30 (*) >90 mL/min   GFR calc Af Amer 35 (*) >90 mL/min  GLUCOSE, CAPILLARY      Component Value Range   Glucose-Capillary 138 (*) 70 - 99 mg/dL  GLUCOSE, CAPILLARY      Component Value Range   Glucose-Capillary 163 (*) 70 - 99 mg/dL  GLUCOSE, CAPILLARY      Component Value  Range   Glucose-Capillary 214 (*) 70 - 99 mg/dL  GLUCOSE, CAPILLARY      Component Value Range   Glucose-Capillary 293 (*) 70 - 99 mg/dL   Comment 1 Documented in Chart     Comment 2 Notify RN    GLUCOSE, CAPILLARY      Component Value Range   Glucose-Capillary 323 (*) 70 - 99 mg/dL   Dg Chest Port 1 View  10/26/2011  *RADIOLOGY REPORT*  Clinical Data: Hypoglycemia.  PORTABLE CHEST - 1 VIEW  Comparison: 06/22/2011  Findings: There is a right chest wall pacer device with lead in the right atrial appendage and right ventricle. Heart size is mildly enlarged.  The patient is status post median sternotomy and CABG. The lung volumes are low.  Eventration of the right hemidiaphragm is noted.  IMPRESSION:  1.  Low lung volumes and eventration of the right hemidiaphragm. 2.  No acute findings.   Original Report Authenticated By: Angelita Ingles, M.D.       [place], [place] 10/26/11 2231

## 2011-10-26 NOTE — ED Notes (Signed)
Given [place] sandwich, milk, orange juice, graham crackers with peanut butter, cheese sticks, and peanut butter crackers - pt tolerating well

## 2011-10-30 ENCOUNTER — Encounter: Payer: Self-pay | Admitting: Nurse Practitioner

## 2011-11-09 ENCOUNTER — Encounter: Payer: Self-pay | Admitting: Vascular Surgery

## 2011-11-22 ENCOUNTER — Other Ambulatory Visit: Payer: Self-pay | Admitting: Internal Medicine

## 2011-11-22 DIAGNOSIS — G459 Transient cerebral ischemic attack, unspecified: Secondary | ICD-10-CM

## 2011-11-27 ENCOUNTER — Ambulatory Visit
Admission: RE | Admit: 2011-11-27 | Discharge: 2011-11-27 | Disposition: A | Discharge status: Home / Self Care | Payer: Medicare Other | Source: Ambulatory Visit | Attending: Internal Medicine | Admitting: Internal Medicine

## 2011-11-27 DIAGNOSIS — G459 Transient cerebral ischemic attack, unspecified: Secondary | ICD-10-CM

## 2011-12-04 ENCOUNTER — Encounter: Payer: Self-pay | Admitting: Nurse Practitioner

## 2011-12-07 ENCOUNTER — Encounter: Payer: Self-pay | Admitting: Nurse Practitioner

## 2011-12-07 ENCOUNTER — Ambulatory Visit (INDEPENDENT_AMBULATORY_CARE_PROVIDER_SITE_OTHER): Payer: Medicare Other | Admitting: Nurse Practitioner

## 2011-12-07 VITALS — BP 148/60 | HR 56 | Ht 71.0 in | Wt 195.0 lb

## 2011-12-07 DIAGNOSIS — R0989 Other specified symptoms and signs involving the circulatory and respiratory systems: Secondary | ICD-10-CM

## 2011-12-07 DIAGNOSIS — I493 Ventricular premature depolarization: Secondary | ICD-10-CM

## 2011-12-07 DIAGNOSIS — R0789 Other chest pain: Secondary | ICD-10-CM

## 2011-12-07 DIAGNOSIS — I4949 Other premature depolarization: Secondary | ICD-10-CM

## 2011-12-07 MED ORDER — CARVEDILOL 25 MG PO TABS: 25.0000 mg | ORAL_TABLET | Freq: Two times a day (BID) | ORAL | Status: DC

## 2011-12-07 NOTE — Progress Notes (Signed)
Colin [name] Date of Birth: [birth year] Medical Record [id number]  History of Present Illness: Colin [name] is seen back today for a work in visit. He is seen for Dr. Acie Fredrickson. He has an ischemic CM with an EF of 30 to 35%, remote CABG in 2004, underlying PTVP, past tobacco abuse, CKD and HTN. He was last seen here by Dr. Acie Fredrickson in May, by EP in July and an ER visit last month for hypoglycemia. Has bilateral carotid disease of less than 50% noted.   He comes in today. He is here alone. He was at his PCP's office earlier this week. Complained of chest pain. Told Dr. Shelia Media that is occurs only when he is lying down on his right side. Relieved by turning over to his left side. No associated symptoms. Noted on EKG some ectopy and was referred here for further follow up. Tells me the chest pain feels like gas. He is not sure if it happens when he walks. He is not very active and seems pretty sedentary. He is not sure if it feels like his heart pain or not. He is quite vague with his history. He is still eating salt. Likes hot dogs. No real swelling. Some dyspnea. Says he has been taking his medicines.   Current Outpatient Prescriptions on File Prior to Visit  Medication Sig Dispense Refill  . B Complex-C (B-COMPLEX WITH VITAMIN C) tablet Take 1 tablet by mouth daily.      . Cyanocobalamin (VITAMIN B 12 PO) Take by mouth. 1000 mcg daily       . ezetimibe (ZETIA) 10 MG tablet Take 10 mg by mouth daily.        . furosemide (LASIX) 40 MG tablet Take 40 mg by mouth daily. Take 1 tablet Monday Wednesday Friday and Saturday.      . insulin lispro protamine-insulin lispro (HUMALOG 50/50) (50-50) 100 UNIT/ML SUSP Inject 15-30 Units into the skin 2 (two) times daily before a meal. 30 units in the am and 15 in the pm      . Omega-3 Fatty Acids (FISH OIL) 1000 MG CAPS Take 1,000 mg by mouth daily.       Marland Kitchen omeprazole (PRILOSEC) 40 MG capsule Take 40 mg by mouth daily as needed. Acid indigestion      . Tamsulosin HCl  (FLOMAX) 0.4 MG CAPS Take 0.4 mg by mouth daily.        Allergies  Allergen Reactions  . Lisinopril     Elevated potassium,renal insuf    Past Medical History  Diagnosis Date  . Coronary artery disease     Status post CABG in 2004  . Peripheral vascular disease     Status post right femoropopliteal bypass  . Dyslipidemia   . Diabetes mellitus   . Hypertension   . Chronic renal insufficiency     creatinine around 2.4  . CHF (congestive heart failure) 2007    ef 38%  . History of CVA (cerebrovascular accident)   . Cardiomyopathy   . COPD (chronic obstructive pulmonary disease)   . History of anemia     Past Surgical History  Procedure Date  . Insert / replace / remove pacemaker 05/17/2005    st. jude dual chamber ddd mode   . Coronary artery bypass graft 4 of 2004    4 vessel bypass  . Femoral bypass july 2006    right with right great toe amputation  . Cardiac catheterization 04/29/2002    Dr. Glade Lloyd  .  Esophagogastroduodenoscopy 07/21/2011    Procedure: ESOPHAGOGASTRODUODENOSCOPY (EGD);  Surgeon: Beryle Beams, MD;  Location: [facility];  Service: Endoscopy;  Laterality: N/A;    History  Smoking status  . Former Smoker  . Quit date: 06/09/2002  Smokeless tobacco  . Never Used    History  Alcohol Use No    History reviewed. No pertinent family history.  Review of Systems: The review of systems is per the HPI.  All other systems were reviewed and are negative.  Physical Exam: BP 148/60  Pulse 56  Ht 5\' 11"  (1.803 m)  Wt 195 lb (88.451 kg)  BMI 27.20 kg/m2 Patient is pleasant and in no acute distress. He is a poor historian. Skin is warm and dry. Color is normal.  HEENT is unremarkable. Normocephalic/atraumatic. PERRL. Sclera are nonicteric. Neck is supple. No masses. No JVD. Lungs are clear. Cardiac exam shows a regular rate and rhythm. Soft murmur. Frequent PVC's noted. Abdomen is soft. Extremities are without edema. Gait and ROM are intact. No gross  neurologic deficits noted.  LABORATORY DATA: Reviewed from Dr. Pennie Banter office. His A1C is 8.6. BUN 39. Creatinine is 2.03.   EKG from Dr. Pennie Banter office showed sinus, A pacing and frequent PVC's.   Pacemaker check today shows 10% PVC burden over the past 4 months. (Had been 13% burden for the 5 months prior to that).   Assessment / Plan: 1. Chronic systolic heart failure - EF is 30 to 35%. Coreg is increased today. He is advised to avoid salt.   2. CAD - atypical chest pain - has had prior CABG 10 years ago - this sounds atypical to me but he is not a great historian. He is having PVC's and I am going to increase the Coreg today to see if this helps. May need to have stress testing but will defer until seen back.   3. HTN - Coreg is increased today.   We will see him back in about 3 weeks. He is for a repeat pacemaker check in January.   Patient is agreeable to this plan and will call if any problems develop in the interim.

## 2011-12-07 NOTE — Patient Instructions (Addendum)
We need to check your pacemaker today  I want to increase your Coreg to 25 mg two times a day. This is at the drug store.  Dr. Acie Fredrickson will see you in about 3 weeks. We will need to recheck your pacemaker at that visit as well.  Stay on your other medicines  Avoid salt.  Call the [facility] office at [phone] if you have any questions, problems or concerns.

## 2011-12-25 ENCOUNTER — Other Ambulatory Visit: Payer: Self-pay | Admitting: *Deleted

## 2011-12-25 DIAGNOSIS — Z48812 Encounter for surgical aftercare following surgery on the circulatory system: Secondary | ICD-10-CM

## 2011-12-25 DIAGNOSIS — I739 Peripheral vascular disease, unspecified: Secondary | ICD-10-CM

## 2011-12-28 ENCOUNTER — Encounter: Payer: Self-pay | Admitting: Neurosurgery

## 2011-12-29 ENCOUNTER — Ambulatory Visit (INDEPENDENT_AMBULATORY_CARE_PROVIDER_SITE_OTHER): Payer: Medicare Other | Admitting: Neurosurgery

## 2011-12-29 ENCOUNTER — Encounter: Payer: Self-pay | Admitting: Neurosurgery

## 2011-12-29 ENCOUNTER — Encounter (INDEPENDENT_AMBULATORY_CARE_PROVIDER_SITE_OTHER): Payer: Medicare Other | Admitting: *Deleted

## 2011-12-29 VITALS — BP 126/65 | HR 60 | Resp 16 | Ht 71.0 in | Wt 190.0 lb

## 2011-12-29 DIAGNOSIS — Z48812 Encounter for surgical aftercare following surgery on the circulatory system: Secondary | ICD-10-CM

## 2011-12-29 DIAGNOSIS — M79609 Pain in unspecified limb: Secondary | ICD-10-CM | POA: Insufficient documentation

## 2011-12-29 DIAGNOSIS — I739 Peripheral vascular disease, unspecified: Secondary | ICD-10-CM

## 2011-12-29 NOTE — Progress Notes (Signed)
[facility] PAD/PVD Office Note  CC: PVD surveillance Referring Physician: Early  History of Present Illness: 78 year old male patient of Dr. Donnetta Hutching is with a history of a failed right femoropopliteal bypass graft. The patient denies true claudication or rest pain. The patient states he does have some right knee pain but he still continues to walk without difficulty and has no complaints of true claudication.  Past Medical History  Diagnosis Date  . Coronary artery disease     Status post CABG in 2004  . Peripheral vascular disease     Status post right femoropopliteal bypass  . Dyslipidemia   . Diabetes mellitus   . Hypertension   . Chronic renal insufficiency     creatinine around 2.4  . CHF (congestive heart failure) 2007    ef 38%  . History of CVA (cerebrovascular accident)   . Cardiomyopathy   . COPD (chronic obstructive pulmonary disease)   . History of anemia     ROS: [x]  Positive   [ ]  Denies    General: [ ]  Weight loss, [ ]  Fever, [ ]  chills Neurologic: [ ]  Dizziness, [ ]  Blackouts, [ ]  Seizure [ ]  Stroke, [ ]  "Mini stroke", [ ]  Slurred speech, [ ]  Temporary blindness; [ ]  weakness in arms or legs, [ ]  Hoarseness Cardiac: [ ]  Chest pain/pressure, [ ]  Shortness of breath at rest [ ]  Shortness of breath with exertion, [ ]  Atrial fibrillation or irregular heartbeat Vascular: [ ]  Pain in legs with walking, [ ]  Pain in legs at rest, [ ]  Pain in legs at night,  [ ]  Non-healing ulcer, [ ]  Blood clot in vein/DVT,   Pulmonary: [ ]  Home oxygen, [ ]  Productive cough, [ ]  Coughing up blood, [ ]  Asthma,  [ ]  Wheezing Musculoskeletal:  [ ]  Arthritis, [ ]  Low back pain, [ ]  Joint pain Hematologic: [ ]  Easy Bruising, [ ]  Anemia; [ ]  Hepatitis Gastrointestinal: [ ]  Blood in stool, [ ]  Gastroesophageal Reflux/heartburn, [ ]  Trouble swallowing Urinary: [ ]  chronic Kidney disease, [ ]  on HD - [ ]  MWF or [ ]  TTHS, [ ]  Burning with urination, [ ]  Difficulty  urinating Skin: [ ]  Rashes, [ ]  Wounds Psychological: [ ]  Anxiety, [ ]  Depression   Social History History  Substance Use Topics  . Smoking status: Former Smoker    Quit date: 06/09/2002  . Smokeless tobacco: Never Used  . Alcohol Use: No    Family History Family History  Problem Relation Age of Onset  . Hyperlipidemia Mother   . Heart disease Father     Allergies  Allergen Reactions  . Lisinopril     Elevated potassium,renal insuf    Current Outpatient Prescriptions  Medication Sig Dispense Refill  . B Complex-C (B-COMPLEX WITH VITAMIN C) tablet Take 1 tablet by mouth daily.      . carvedilol (COREG) 25 MG tablet Take 1 tablet (25 mg total) by mouth 2 (two) times daily.  60 tablet  11  . Cyanocobalamin (VITAMIN B 12 PO) Take by mouth. 1000 mcg daily       . ezetimibe (ZETIA) 10 MG tablet Take 10 mg by mouth daily.        . furosemide (LASIX) 40 MG tablet Take 40 mg by mouth daily. Take 1 tablet Monday Wednesday Friday and Saturday.      . insulin lispro protamine-insulin lispro (HUMALOG 50/50) (50-50) 100 UNIT/ML SUSP Inject 15-30 Units into the skin 2 (  two) times daily before a meal. 30 units in the am and 15 in the pm      . Omega-3 Fatty Acids (FISH OIL) 1000 MG CAPS Take 1,000 mg by mouth daily.       Marland Kitchen omeprazole (PRILOSEC) 40 MG capsule Take 40 mg by mouth daily as needed. Acid indigestion      . Tamsulosin HCl (FLOMAX) 0.4 MG CAPS Take 0.4 mg by mouth daily.        Physical Examination  Filed Vitals:   12/29/11 0924  BP: 126/65  Pulse: 60  Resp: 16    Body mass index is 26.50 kg/(m^2).  General:  WDWN in NAD Gait: Normal HEENT: WNL Eyes: Pupils equal Pulmonary: normal non-labored breathing , without Rales, rhonchi,  wheezing Cardiac: RRR, without  Murmurs, rubs or gallops; No carotid bruits Abdomen: soft, NT, no masses Skin: no rashes, ulcers noted Vascular Exam/Pulses: Lower extremity pulses are not palpable but he is perfused and his feet are pink  with no open ulcerations  Extremities without ischemic changes, no Gangrene , no cellulitis; no open wounds;  Musculoskeletal: no muscle wasting or atrophy  Neurologic: A&O X 3; Appropriate Affect ; SENSATION: normal; MOTOR FUNCTION:  moving all extremities equally. Speech is fluent/normal  Non-Invasive Vascular Imaging: ABIs today are 0.41 monophasic on the right, 0.56 monophasic on the left which is consistent with previous years exams  ASSESSMENT/PLAN: This patient is asymptomatic for claudication, he has requested two-year followup and is not want further intervention at this time. The patient will followup in the next year for ABIs. His questions were encouraged and answered, he is in agreement with this plan.  Colin [name] ANP  Clinic M.D.: Colin [name]

## 2011-12-29 NOTE — Addendum Note (Signed)
Addended by: Mena Goes on: 12/29/2011 03:24 PM   Modules accepted: Orders

## 2012-01-01 ENCOUNTER — Ambulatory Visit (INDEPENDENT_AMBULATORY_CARE_PROVIDER_SITE_OTHER): Payer: Medicare Other | Admitting: Cardiovascular Disease

## 2012-01-01 ENCOUNTER — Encounter: Payer: Self-pay | Admitting: Cardiovascular Disease

## 2012-01-01 VITALS — BP 130/65 | HR 68 | Ht 71.0 in | Wt 187.8 lb

## 2012-01-01 DIAGNOSIS — I501 Left ventricular failure: Secondary | ICD-10-CM

## 2012-01-01 DIAGNOSIS — I2510 Atherosclerotic heart disease of native coronary artery without angina pectoris: Secondary | ICD-10-CM

## 2012-01-01 NOTE — Patient Instructions (Addendum)
Your physician wants you to follow-up in: 6 months  You will receive a reminder letter in the mail two months in advance. If you don't receive a letter, please call our office to schedule the follow-up appointment.  Your physician recommends that you return for lab work in: need bmet in 6 months for renal insufficiency

## 2012-01-01 NOTE — Progress Notes (Signed)
Colin [name] Date of Birth  06-Oct-1928       [facility] [address], [place], [place]  [address]   [place], Colin [name]  [phone]     [phone]   Fax  [phone]    Fax [phone]  Problem List: 1. Coronary artery disease-status post CABG 2004 2. Congestive heart failure-EF of 35% 3. Peripheral vascular disease 4. Dyslipidemia 5. Diabetes mellitus 6. Hypertension 10. Pacemaker 8. Chronic renal insufficiency with baseline creatinine around 2.4  History of Present Illness:  [name] is doing well. He still has some chest pain that is related to positional changes. He does not have any angina pain. He's breathing is overall very good.  He seems to be stable from a cardiac standpoint.  He avoids salt.  Current Outpatient Prescriptions on File Prior to Visit  Medication Sig Dispense Refill  . B Complex-C (B-COMPLEX WITH VITAMIN C) tablet Take 1 tablet by mouth daily.      . carvedilol (COREG) 25 MG tablet Take 1 tablet (25 mg total) by mouth 2 (two) times daily.  60 tablet  11  . Cyanocobalamin (VITAMIN B 12 PO) Take by mouth. 1000 mcg daily       . ezetimibe (ZETIA) 10 MG tablet Take 10 mg by mouth daily.        . furosemide (LASIX) 40 MG tablet Take 40 mg by mouth daily. Take 1 tablet Monday Wednesday Friday and Saturday.      . insulin lispro protamine-insulin lispro (HUMALOG 50/50) (50-50) 100 UNIT/ML SUSP Inject 15-30 Units into the skin 2 (two) times daily before a meal. 30 units in the am and 15 in the pm      . Omega-3 Fatty Acids (FISH OIL) 1000 MG CAPS Take 1,000 mg by mouth daily.       Marland Kitchen omeprazole (PRILOSEC) 40 MG capsule Take 40 mg by mouth daily as needed. Acid indigestion      . Tamsulosin HCl (FLOMAX) 0.4 MG CAPS Take 0.4 mg by mouth daily.        Allergies  Allergen Reactions  . Lisinopril     Elevated potassium,renal insuf    Past Medical History  Diagnosis Date   . Coronary artery disease     Status post CABG in 2004  . Peripheral vascular disease     Status post right femoropopliteal bypass  . Dyslipidemia   . Diabetes mellitus   . Hypertension   . Chronic renal insufficiency     creatinine around 2.4  . CHF (congestive heart failure) 2007    ef 38%  . History of CVA (cerebrovascular accident)   . Cardiomyopathy   . COPD (chronic obstructive pulmonary disease)   . History of anemia     Past Surgical History  Procedure Date  . Insert / replace / remove pacemaker 05/17/2005    [employer] dual chamber ddd mode   . Coronary artery bypass graft 4 of 2004    4 vessel bypass  . Femoral bypass july 2006    right with right great toe amputation  . Cardiac catheterization 04/29/2002    Dr. Glade Lloyd  . Esophagogastroduodenoscopy 07/21/2011    Procedure: ESOPHAGOGASTRODUODENOSCOPY (EGD);  Surgeon: Beryle Beams, MD;  Location: [facility];  Service: Endoscopy;  Laterality: N/A;    History  Smoking status  . Former Smoker  . Quit date: 06/09/2002  Smokeless tobacco  .  Never Used    History  Alcohol Use No    Family History  Problem Relation Age of Onset  . Hyperlipidemia Mother   . Heart disease Father     Reviw of Systems:  Reviewed in the HPI.  All other systems are negative.  Physical Exam: Blood pressure 130/65, pulse 68, height 5\' 11"  (1.803 m), weight 187 lb 12.8 oz (85.186 kg), SpO2 98.00%. General: Well developed, well nourished, in no acute distress.  Head: Normocephalic, atraumatic, sclera non-icteric, mucus membranes are moist,   Neck: Supple. Carotids are 2 + without bruits. No JVD  Lungs: Clear bilaterally to auscultation.  Heart: regular rate.  normal  S1 S2. No murmurs, gallops or rubs.  Abdomen: Soft, non-tender, non-distended with normal bowel sounds. No hepatomegaly. No rebound/guarding. No masses.  Msk:  Strength and tone are normal  Extremities: No clubbing or cyanosis. No edema.  Distal pedal pulses  are 2+ and equal bilaterally.  Neuro: Alert and oriented X 3. Moves all extremities spontaneously.  Psych:  Responds to questions appropriately with a normal affect.  ECG: Dec. 9. 2013 - atrial pacing at 67. He has left ventricular hypertrophy with repolarization changes.   Assessment / Plan:

## 2012-01-01 NOTE — Assessment & Plan Note (Signed)
Colin [name] is doing well. We'll continue with the same medications. I'll see him again in 6 months for followup visit.  I've encouraged him to get out and a little exercise.

## 2012-01-01 NOTE — Assessment & Plan Note (Signed)
He has occasional episodes of chest pain but these do not sound like angina. These chest pains are more due to  positional changes.

## 2012-01-09 ENCOUNTER — Emergency Department (HOSPITAL_COMMUNITY): Payer: Medicare Other

## 2012-01-09 ENCOUNTER — Observation Stay (HOSPITAL_COMMUNITY)
Admission: EM | Admit: 2012-01-09 | Discharge: 2012-01-11 | Disposition: A | Discharge status: Home / Self Care | Payer: Medicare Other | Attending: Internal Medicine | Admitting: Internal Medicine

## 2012-01-09 ENCOUNTER — Encounter (HOSPITAL_COMMUNITY): Payer: Self-pay | Admitting: Physical Medicine and Rehabilitation

## 2012-01-09 DIAGNOSIS — I428 Other cardiomyopathies: Secondary | ICD-10-CM | POA: Insufficient documentation

## 2012-01-09 DIAGNOSIS — E1159 Type 2 diabetes mellitus with other circulatory complications: Secondary | ICD-10-CM | POA: Insufficient documentation

## 2012-01-09 DIAGNOSIS — E785 Hyperlipidemia, unspecified: Secondary | ICD-10-CM

## 2012-01-09 DIAGNOSIS — R42 Dizziness and giddiness: Secondary | ICD-10-CM

## 2012-01-09 DIAGNOSIS — I798 Other disorders of arteries, arterioles and capillaries in diseases classified elsewhere: Secondary | ICD-10-CM | POA: Insufficient documentation

## 2012-01-09 DIAGNOSIS — R55 Syncope and collapse: Principal | ICD-10-CM

## 2012-01-09 DIAGNOSIS — I739 Peripheral vascular disease, unspecified: Secondary | ICD-10-CM

## 2012-01-09 DIAGNOSIS — I501 Left ventricular failure: Secondary | ICD-10-CM

## 2012-01-09 DIAGNOSIS — J449 Chronic obstructive pulmonary disease, unspecified: Secondary | ICD-10-CM

## 2012-01-09 DIAGNOSIS — J4489 Other specified chronic obstructive pulmonary disease: Secondary | ICD-10-CM | POA: Insufficient documentation

## 2012-01-09 DIAGNOSIS — I129 Hypertensive chronic kidney disease with stage 1 through stage 4 chronic kidney disease, or unspecified chronic kidney disease: Secondary | ICD-10-CM | POA: Insufficient documentation

## 2012-01-09 DIAGNOSIS — R0609 Other forms of dyspnea: Secondary | ICD-10-CM

## 2012-01-09 DIAGNOSIS — N179 Acute kidney failure, unspecified: Secondary | ICD-10-CM

## 2012-01-09 DIAGNOSIS — I2510 Atherosclerotic heart disease of native coronary artery without angina pectoris: Secondary | ICD-10-CM

## 2012-01-09 DIAGNOSIS — M79609 Pain in unspecified limb: Secondary | ICD-10-CM

## 2012-01-09 DIAGNOSIS — Z79899 Other long term (current) drug therapy: Secondary | ICD-10-CM | POA: Insufficient documentation

## 2012-01-09 DIAGNOSIS — Z950 Presence of cardiac pacemaker: Secondary | ICD-10-CM

## 2012-01-09 DIAGNOSIS — I509 Heart failure, unspecified: Secondary | ICD-10-CM | POA: Insufficient documentation

## 2012-01-09 DIAGNOSIS — I5022 Chronic systolic (congestive) heart failure: Secondary | ICD-10-CM | POA: Diagnosis present

## 2012-01-09 DIAGNOSIS — Z8673 Personal history of transient ischemic attack (TIA), and cerebral infarction without residual deficits: Secondary | ICD-10-CM

## 2012-01-09 DIAGNOSIS — E1151 Type 2 diabetes mellitus with diabetic peripheral angiopathy without gangrene: Secondary | ICD-10-CM

## 2012-01-09 DIAGNOSIS — R51 Headache: Secondary | ICD-10-CM | POA: Insufficient documentation

## 2012-01-09 DIAGNOSIS — R0602 Shortness of breath: Secondary | ICD-10-CM | POA: Insufficient documentation

## 2012-01-09 DIAGNOSIS — R3129 Other microscopic hematuria: Secondary | ICD-10-CM | POA: Diagnosis present

## 2012-01-09 DIAGNOSIS — R739 Hyperglycemia, unspecified: Secondary | ICD-10-CM

## 2012-01-09 DIAGNOSIS — E860 Dehydration: Secondary | ICD-10-CM

## 2012-01-09 DIAGNOSIS — Z794 Long term (current) use of insulin: Secondary | ICD-10-CM | POA: Insufficient documentation

## 2012-01-09 DIAGNOSIS — N289 Disorder of kidney and ureter, unspecified: Secondary | ICD-10-CM

## 2012-01-09 DIAGNOSIS — Z862 Personal history of diseases of the blood and blood-forming organs and certain disorders involving the immune mechanism: Secondary | ICD-10-CM

## 2012-01-09 DIAGNOSIS — C61 Malignant neoplasm of prostate: Secondary | ICD-10-CM | POA: Insufficient documentation

## 2012-01-09 DIAGNOSIS — N184 Chronic kidney disease, stage 4 (severe): Secondary | ICD-10-CM | POA: Diagnosis present

## 2012-01-09 DIAGNOSIS — N183 Chronic kidney disease, stage 3 unspecified: Secondary | ICD-10-CM

## 2012-01-09 HISTORY — DX: Type 2 diabetes mellitus with diabetic peripheral angiopathy without gangrene: E11.51

## 2012-01-09 LAB — COMPREHENSIVE METABOLIC PANEL
ALT: 31 U/L (ref 0–53)
Alkaline Phosphatase: 109 U/L (ref 39–117)
BUN: 69 mg/dL — ABNORMAL HIGH (ref 6–23)
CO2: 20 mEq/L (ref 19–32)
GFR calc Af Amer: 26 mL/min — ABNORMAL LOW (ref >90)
GFR calc non Af Amer: 23 mL/min — ABNORMAL LOW (ref >90)
Glucose, Bld: 438 mg/dL — ABNORMAL HIGH (ref 70–99)
Potassium: 4.9 mEq/L (ref 3.5–5.1)
Sodium: 131 mEq/L — ABNORMAL LOW (ref 135–145)
Total Bilirubin: 0.4 mg/dL (ref 0.3–1.2)
Total Protein: 7.7 g/dL (ref 6.0–8.3)

## 2012-01-09 LAB — CBC WITH DIFFERENTIAL/PLATELET
Eosinophils Absolute: 0.1 10*3/uL (ref 0.0–0.7)
Hemoglobin: 12.8 g/dL — ABNORMAL LOW (ref 13.0–17.0)
Lymphocytes Relative: 30 % (ref 12–46)
Lymphs Abs: 1.5 10*3/uL (ref 0.7–4.0)
MCH: 27.7 pg (ref 26.0–34.0)
MCV: 84.4 fL (ref 78.0–100.0)
Monocytes Relative: 7 % (ref 3–12)
Neutrophils Relative %: 61 % (ref 43–77)
Platelets: 160 10*3/uL (ref 150–400)
RBC: 4.62 MIL/uL (ref 4.22–5.81)
WBC: 5 10*3/uL (ref 4.0–10.5)

## 2012-01-09 LAB — GLUCOSE, CAPILLARY: Glucose-Capillary: 263 mg/dL — ABNORMAL HIGH (ref 70–99)

## 2012-01-09 LAB — URINALYSIS, ROUTINE W REFLEX MICROSCOPIC
Bilirubin Urine: NEGATIVE
Ketones, ur: NEGATIVE mg/dL
Leukocytes, UA: NEGATIVE
Nitrite: NEGATIVE
Protein, ur: 30 mg/dL — AB
Urobilinogen, UA: 0.2 mg/dL (ref 0.0–1.0)
pH: 5 (ref 5.0–8.0)

## 2012-01-09 LAB — PROTIME-INR: INR: 1.06 (ref 0.00–1.49)

## 2012-01-09 LAB — URINE MICROSCOPIC-ADD ON

## 2012-01-09 MED ORDER — ONDANSETRON HCL 4 MG/2ML IJ SOLN: 4.0000 mg | Freq: Four times a day (QID) | INTRAMUSCULAR | Status: DC | PRN

## 2012-01-09 MED ORDER — ONDANSETRON HCL 4 MG PO TABS: 4.0000 mg | ORAL_TABLET | Freq: Four times a day (QID) | ORAL | Status: DC | PRN

## 2012-01-09 MED ORDER — PANTOPRAZOLE SODIUM 40 MG PO TBEC
40.0000 mg | DELAYED_RELEASE_TABLET | Freq: Every day | ORAL | Status: DC
  Administered 2012-01-09 – 2012-01-11 (×3): 40 mg via ORAL
  Filled 2012-01-09 (×3): qty 1

## 2012-01-09 MED ORDER — SODIUM CHLORIDE 0.9 % IV SOLN: INTRAVENOUS | Status: DC

## 2012-01-09 MED ORDER — INSULIN ASPART 100 UNIT/ML ~~LOC~~ SOLN
5.0000 [IU] | Freq: Once | SUBCUTANEOUS | Status: AC
  Administered 2012-01-09: 100 [IU] via INTRAVENOUS
  Filled 2012-01-09: qty 5

## 2012-01-09 MED ORDER — SODIUM CHLORIDE 0.9 % IJ SOLN
3.0000 mL | Freq: Two times a day (BID) | INTRAMUSCULAR | Status: DC
  Administered 2012-01-10: 3 mL via INTRAVENOUS

## 2012-01-09 MED ORDER — SODIUM CHLORIDE 0.9 % IV SOLN
INTRAVENOUS | Status: DC
  Administered 2012-01-09: 21:00:00 via INTRAVENOUS

## 2012-01-09 MED ORDER — TAMSULOSIN HCL 0.4 MG PO CAPS
0.4000 mg | ORAL_CAPSULE | Freq: Every day | ORAL | Status: DC
  Administered 2012-01-10: 0.4 mg via ORAL
  Filled 2012-01-09 (×2): qty 1

## 2012-01-09 MED ORDER — DEXTROSE 50 % IV SOLN: 25.0000 mL | Freq: Once | INTRAVENOUS | Status: AC | PRN

## 2012-01-09 MED ORDER — SODIUM CHLORIDE 0.9 % IV SOLN: Freq: Once | INTRAVENOUS | Status: DC

## 2012-01-09 MED ORDER — ACETAMINOPHEN 325 MG PO TABS: 650.0000 mg | ORAL_TABLET | Freq: Four times a day (QID) | ORAL | Status: DC | PRN

## 2012-01-09 MED ORDER — INSULIN ASPART 100 UNIT/ML ~~LOC~~ SOLN
0.0000 [IU] | Freq: Three times a day (TID) | SUBCUTANEOUS | Status: DC
  Administered 2012-01-10: 8 [IU] via SUBCUTANEOUS
  Administered 2012-01-10: 5 [IU] via SUBCUTANEOUS

## 2012-01-09 MED ORDER — EZETIMIBE 10 MG PO TABS
10.0000 mg | ORAL_TABLET | Freq: Every day | ORAL | Status: DC
  Administered 2012-01-10 – 2012-01-11 (×2): 10 mg via ORAL
  Filled 2012-01-09 (×3): qty 1

## 2012-01-09 MED ORDER — CARVEDILOL 25 MG PO TABS
25.0000 mg | ORAL_TABLET | Freq: Two times a day (BID) | ORAL | Status: DC
  Administered 2012-01-09 – 2012-01-11 (×4): 25 mg via ORAL
  Filled 2012-01-09 (×5): qty 1

## 2012-01-09 MED ORDER — SODIUM CHLORIDE 0.9 % IV BOLUS (SEPSIS)
500.0000 mL | Freq: Once | INTRAVENOUS | Status: AC
  Administered 2012-01-09: 1000 mL via INTRAVENOUS

## 2012-01-09 MED ORDER — OXYCODONE HCL 5 MG PO TABS: 5.0000 mg | ORAL_TABLET | ORAL | Status: DC | PRN

## 2012-01-09 MED ORDER — HEPARIN SODIUM (PORCINE) 5000 UNIT/ML IJ SOLN
5000.0000 [IU] | Freq: Three times a day (TID) | INTRAMUSCULAR | Status: DC
  Administered 2012-01-09 – 2012-01-11 (×5): 5000 [IU] via SUBCUTANEOUS
  Filled 2012-01-09 (×8): qty 1

## 2012-01-09 MED ORDER — DEXTROSE 50 % IV SOLN: 50.0000 mL | Freq: Once | INTRAVENOUS | Status: AC | PRN

## 2012-01-09 MED ORDER — ACETAMINOPHEN 650 MG RE SUPP: 650.0000 mg | Freq: Four times a day (QID) | RECTAL | Status: DC | PRN

## 2012-01-09 MED ORDER — B COMPLEX-C PO TABS
1.0000 | ORAL_TABLET | Freq: Every day | ORAL | Status: DC
  Administered 2012-01-10 – 2012-01-11 (×2): 1 via ORAL
  Filled 2012-01-09 (×3): qty 1

## 2012-01-09 MED ORDER — INSULIN ASPART 100 UNIT/ML ~~LOC~~ SOLN
0.0000 [IU] | Freq: Every day | SUBCUTANEOUS | Status: DC
  Administered 2012-01-09: 3 [IU] via SUBCUTANEOUS

## 2012-01-09 MED ORDER — GLUCOSE 40 % PO GEL: 1.0000 | ORAL | Status: DC | PRN
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
## 2012-01-09 NOTE — H&P (Signed)
[facility] History and Physical  Leonardo Koob Brazel [id number] DOB: July 26, 1928 DOA: 01/09/2012  Referring physician: Janice Norrie, MD PCP: Horatio Pel, MD  Specialists: Darden Amber., MD   Chief Complaint: Syncope  HPI: Colin [name] is a 78 y.o. male with past medical history of CAD status post CABG in [id number], has systolic CHF with LVEF of 38%, diabetes mellitus type 2 and CKD stage III. Sent from his primary care physician office today because of syncope. Patient reported that he was in his usual state of health till yesterday, he was still can on the phone to his daughter and when he finished his phone call he felt dizzy and collapsed. He felt that the room spinning around him for one minute, he never lost his consciousness, after his collapse his wife came in and helped him to get to a nearby chair. He denies any chest pain, SOB or palpitations. He reported his blood sugar was okay at that time. He went to see his primary care physician today he was found to have slightly higher creatinine than his baseline so he was sent to the hospital for further evaluation. In the emergency department the creatinine is 2.5, his baseline is 1.9 in October of 2013, had negative CT scan of the head. Patient will be admitted overnight for gentle hydration with IV fluids.  Review of Systems:  Constitutional: negative for anorexia, fevers and sweats Eyes: negative for irritation, redness and visual disturbance Ears, nose, mouth, throat, and face: negative for earaches, epistaxis, nasal congestion and sore throat Respiratory: negative for cough, dyspnea on exertion, sputum and wheezing Cardiovascular: Syncope per history of present illness Gastrointestinal: negative for abdominal pain, constipation, diarrhea, melena, nausea and vomiting Genitourinary:negative for dysuria, frequency and hematuria Hematologic/lymphatic: negative for bleeding, easy bruising and  lymphadenopathy Musculoskeletal:negative for arthralgias, muscle weakness and stiff joints Neurological: negative for coordination problems, gait problems, headaches and weakness Endocrine: negative for diabetic symptoms including polydipsia, polyuria and weight loss Allergic/Immunologic: negative for anaphylaxis, hay fever and urticaria   Past Medical History  Diagnosis Date  . Coronary artery disease     Status post CABG in 2004  . Peripheral vascular disease     Status post right femoropopliteal bypass  . Dyslipidemia   . Diabetes mellitus   . Hypertension   . Chronic renal insufficiency     creatinine around 2.4  . CHF (congestive heart failure) 2007    ef 38%  . History of CVA (cerebrovascular accident)   . Cardiomyopathy   . COPD (chronic obstructive pulmonary disease)   . History of anemia    Past Surgical History  Procedure Date  . Insert / replace / remove pacemaker 05/17/2005    [employer] [facility] dual chamber ddd mode   . Coronary artery bypass graft 4 of 2004    4 vessel bypass  . Femoral bypass july 2006    right with right great toe amputation  . Cardiac catheterization 04/29/2002    Dr. Glade Lloyd  . Esophagogastroduodenoscopy 07/21/2011    Procedure: ESOPHAGOGASTRODUODENOSCOPY (EGD);  Surgeon: Beryle Beams, MD;  Location: [facility];  Service: Endoscopy;  Laterality: N/A;   Social History:  reports that he quit smoking about 9 years ago. He has never used smokeless tobacco. He reports that he does not drink alcohol or use illicit drugs.   Allergies  Allergen Reactions  . Lisinopril     Elevated potassium,renal insuf    Family History  Problem Relation Age of Onset  .  Hyperlipidemia Mother   . Heart disease Father     Prior to Admission medications   Medication Sig Start Date End Date Taking? Authorizing Provider  B Complex-C (B-COMPLEX WITH VITAMIN C) tablet Take 1 tablet by mouth daily.   Yes Historical Provider, MD  carvedilol (COREG) 25 MG tablet Take 1  tablet (25 mg total) by mouth 2 (two) times daily. 12/07/11  Yes Burtis Junes, NP  Cyanocobalamin (VITAMIN B 12 PO) Take by mouth. 1000 mcg daily    Yes Historical Provider, MD  ezetimibe (ZETIA) 10 MG tablet Take 10 mg by mouth daily.     Yes Historical Provider, MD  furosemide (LASIX) 40 MG tablet Take 40 mg by mouth daily. Take 1 tablet Monday Wednesday Friday and Saturday. 06/12/11  Yes Thayer Headings, MD  insulin lispro protamine-insulin lispro (HUMALOG 50/50) (50-50) 100 UNIT/ML SUSP Inject 15-30 Units into the skin 2 (two) times daily before a meal. 28 units in the am and 22 in the pm   Yes Historical Provider, MD  Omega-3 Fatty Acids (FISH OIL) 1000 MG CAPS Take 1,000 mg by mouth daily.    Yes Historical Provider, MD  omeprazole (PRILOSEC) 40 MG capsule Take 40 mg by mouth daily as needed. Acid indigestion   Yes Historical Provider, MD  Tamsulosin HCl (FLOMAX) 0.4 MG CAPS Take 0.4 mg by mouth daily.   Yes Historical Provider, MD   Physical Exam: Filed Vitals:   01/09/12 1315  BP: 118/48  Pulse: 77  Temp: 98.1 F (36.7 C)  TempSrc: Oral  Resp: 20  SpO2: 95%   General appearance: alert, cooperative and no distress  Head: Normocephalic, without obvious abnormality, atraumatic  Eyes: conjunctivae/corneas clear. PERRL, EOM's intact. Fundi benign.  Nose: Nares normal. Septum midline. Mucosa normal. No drainage or sinus tenderness.  Throat: lips, mucosa, and tongue normal; teeth and gums normal  Neck: Supple, no masses, no cervical lymphadenopathy, no JVD appreciated, no meningeal signs Resp: clear to auscultation bilaterally  Chest wall: no tenderness  Cardio: regular rate and rhythm, S1, S2 normal, no murmur, click, rub or gallop  GI: soft, non-tender; bowel sounds normal; no masses, no organomegaly  Extremities: extremities normal, atraumatic, no cyanosis or edema  Skin: Skin color, texture, turgor normal. No rashes or lesions  Neurologic: Alert and oriented X 3, normal  strength and tone. Normal symmetric reflexes. Normal coordination and gait   Labs on Admission:  Basic Metabolic Panel:  Lab [id number] 1346  NA 131*  K 4.9  CL 98  CO2 20  GLUCOSE 438*  BUN 69*  CREATININE 2.52*  CALCIUM 10.2  MG --  PHOS --   Liver Function Tests:  Lab 01/09/12 1346  AST 26  ALT 31  ALKPHOS 109  BILITOT 0.4  PROT 7.7  ALBUMIN 3.4*   No results found for this basename: LIPASE:5,AMYLASE:5 in the last 168 hours No results found for this basename: AMMONIA:5 in the last 168 hours CBC:  Lab 01/09/12 1346  WBC 5.0  NEUTROABS 3.1  HGB 12.8*  HCT 39.0  MCV 84.4  PLT 160   Cardiac Enzymes: No results found for this basename: CKTOTAL:5,CKMB:5,CKMBINDEX:5,TROPONINI:5 in the last 168 hours  BNP (last 3 results)  Basename 06/09/11 1122 04/10/11 1034  PROBNP 92.0 425.0*   CBG: No results found for this basename: GLUCAP:5 in the last 168 hours  Radiological Exams on Admission: Ct Head Wo Contrast  01/09/2012  *RADIOLOGY REPORT*  Clinical Data: Syncope.  CT HEAD WITHOUT CONTRAST  Technique:  Contiguous axial images were obtained from the base of the skull through the vertex without contrast.  Comparison: None.  Findings: There is marked atrophy.  Chronic microvascular ischemic change is identified.  No evidence of acute abnormality including infarct, hemorrhage, mass lesion, mass effect, midline shift or abnormal extra-axial fluid collection.  No hydrocephalus or pneumocephalus. 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  IMPRESSION: No acute abnormality.   Original Report Authenticated By: Orlean Patten, M.D.     EKG: Independently reviewed.   Assessment/Plan Principal Problem:  *Syncope Active Problems:  Cardiac pacemaker in situ  Acute kidney injury  CKD (chronic kidney disease), stage III  Diabetes mellitus with peripheral artery disease   Syncope -Likely secondary to hypovolemia and  dehydration. -Less likely to be neurologically found, CT scan is negative, he has bilateral carotid stenosis of about 50%. -MRI cannot be done because of permanent pacemaker in situ. -I will hold Lasix, hydrate gently with IV fluids. -Ambulate the patient in the morning, if he does not have symptoms she probably can be discharged.  Acute kidney injury on CKD stage III -Patient creatinine baseline is 1.9, presented with creatinine of 2.5. -Likely to be secondary to dehydration, hold Lasix, start gentle hydration with IV fluids.  -Check BMP in the morning.  Chronic systolic CHF -Has LVEF of 30-35% checked by 2-D echo in 05/09/2011. -Patient is on Coreg 25 mg twice a day, he is on Lasix 40 mg daily. -He rather looks dehydrated on the clinical examination, I will hydrate gently with IV fluids. -Likely to discontinue IV fluids in the morning.  Diabetes mellitus type 2 -Check hemoglobin A1c, carbohydrate modified diet. -Start insulin sliding scale check CBGs a.c. and at bedtime.   Code Status: Full code Family Communication: Discussed with the patient himself and his son was at bedside at the time of the interview. Disposition Plan: Observation  Time spent: 70 minutes  [facility] A [facility] Pager [phone]  If 7PM-7AM, please contact night-coverage [email or web address] Password [facility] 01/09/2012, 6:33 PM

## 2012-01-09 NOTE — ED Notes (Signed)
[facility] called by [facility] for pacemaker interrogation
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
## 2012-01-09 NOTE — ED Notes (Signed)
Pt presents to department for evaluation of syncope, hematuria, and frequent falls. Was seen at PCP this morning and referred to ED. Pt states dizziness and syncope at home, states "I feel like I am drunk." also states hyperglycemia and blood noted in urine. Symptoms ongoing for several days. Also states he has been passing out and falling frequently.

## 2012-01-09 NOTE — ED Provider Notes (Signed)
History     CSN: [id number]  Arrival date & time 01/09/12  1307   First MD Initiated Contact with Patient 01/09/12 1503      Chief Complaint  Patient presents with  . Loss of Consciousness  . Dizziness  . Hematuria    (Consider location/radiation/quality/duration/timing/severity/associated sxs/prior treatment) HPI  Patient states yesterday he was standing up talking to his daughter on the telephone when he had acute onset of a spinning feeling and he fell to the floor. He states the spinning feeling lasted 10-20 minutes. He denies loss of consciousness. He states he had mild discomfort on both sides of his head that went away when the spinning went away. He denies nausea, vomiting, or diaphoresis. He denies chest pain but states he did have mild shortness of breath. He denies palpitations. He states nothing he does makes it feel better, nothing he does makes it feel worse. He states he's had a normal appetite. He states 2 weeks ago he was driving and started having some altered mental status and was found to have a low blood sugar. He denies any spinning sensation at that time. He relates he has had some spinning off and on for the past couple weeks however this is the first time he had syncope with it. He is actually seen by  his cardiologist last week on the ninth. He was seen today by his PCP and sent to the emergency department for further evaluation.  Patient has had bypass surgery in 2004. He had a St. Jude's pacemaker inserted in 2007.  PCP Dr. Shelia Media Cardiologist Dr. Acie Fredrickson with [facility]  Past Medical History  Diagnosis Date  . Coronary artery disease     Status post CABG in 2004  . Peripheral vascular disease     Status post right femoropopliteal bypass  . Dyslipidemia   . Diabetes mellitus   . Hypertension   . Chronic renal insufficiency     creatinine around 2.4  . CHF (congestive heart failure) 2007    ef 38%  . History of CVA (cerebrovascular accident)   .  Cardiomyopathy   . COPD (chronic obstructive pulmonary disease)   . History of anemia     Past Surgical History  Procedure Date  . Insert / replace / remove pacemaker 05/17/2005    st. jude dual chamber ddd mode   . Coronary artery bypass graft 4 of 2004    4 vessel bypass  . Femoral bypass july 2006    right with right great toe amputation  . Cardiac catheterization 04/29/2002    Dr. Glade Lloyd  . Esophagogastroduodenoscopy 07/21/2011    Procedure: ESOPHAGOGASTRODUODENOSCOPY (EGD);  Surgeon: Beryle Beams, MD;  Location: [facility];  Service: Endoscopy;  Laterality: N/A;    Family History  Problem Relation Age of Onset  . Hyperlipidemia Mother   . Heart disease Father     History  Substance Use Topics  . Smoking status: Former Smoker    Quit date: 06/09/2002  . Smokeless tobacco: Never Used  . Alcohol Use: No   Lives at home Lives with spouse Uses a cane  Review of Systems  All other systems reviewed and are negative.    Allergies  Lisinopril  Home Medications   Current Outpatient Rx  Name  Route  Sig  Dispense  Refill  . B COMPLEX-C PO TABS   Oral   Take 1 tablet by mouth daily.         Marland Kitchen CARVEDILOL 25 MG PO TABS  Oral   Take 1 tablet (25 mg total) by mouth 2 (two) times daily.   60 tablet   11   . VITAMIN B 12 PO   Oral   Take by mouth. 1000 mcg daily          . EZETIMIBE 10 MG PO TABS   Oral   Take 10 mg by mouth daily.           . FUROSEMIDE 40 MG PO TABS   Oral   Take 40 mg by mouth daily. Take 1 tablet Monday Wednesday Friday and Saturday.         . INSULIN LISPRO PROT & LISPRO (50-50) 100 UNIT/ML [place] SUSP   Subcutaneous   Inject 15-30 Units into the skin 2 (two) times daily before a meal. 28 units in the am and 22 in the pm         . FISH OIL 1000 MG PO CAPS   Oral   Take 1,000 mg by mouth daily.          Marland Kitchen OMEPRAZOLE 40 MG PO CPDR   Oral   Take 40 mg by mouth daily as needed. Acid indigestion         . TAMSULOSIN  HCL 0.4 MG PO CAPS   Oral   Take 0.4 mg by mouth daily.           BP 118/48  Pulse 77  Temp 98.1 F (36.7 C) (Oral)  Resp 20  SpO2 95%  Vital signs normal   Orthostatic vital signs show no hypotension  Physical Exam  Nursing note and vitals reviewed. Constitutional: He is oriented to person, place, and time. He appears well-developed and well-nourished.  Non-toxic appearance. He does not appear ill. No distress.  HENT:  Head: Normocephalic and atraumatic.  Right Ear: External ear normal.  Left Ear: External ear normal.  Nose: Nose normal. No mucosal edema or rhinorrhea.  Mouth/Throat: Oropharynx is clear and moist and mucous membranes are normal. No dental abscesses or uvula swelling.  Eyes: Conjunctivae normal and EOM are normal. Pupils are equal, round, and reactive to light.  Neck: Normal range of motion and full passive range of motion without pain. Neck supple.  Cardiovascular: Normal rate, regular rhythm and normal heart sounds.  Exam reveals no gallop and no friction rub.   No murmur heard.      At time of my exam patient sounds like he was in bigeminy  Pulmonary/Chest: Effort normal and breath sounds normal. No respiratory distress. He has no wheezes. He has no rhonchi. He has no rales. He exhibits no tenderness and no crepitus.  Abdominal: Soft. Normal appearance and bowel sounds are normal. He exhibits no distension. There is no tenderness. There is no rebound and no guarding.  Musculoskeletal: Normal range of motion. He exhibits no edema and no tenderness.       Moves all extremities well.   Patient noted to have some deformity in his left wrist in his left MCP of his palm which he states is from arthritis  Neurological: He is alert and oriented to person, place, and time. He has normal strength. No cranial nerve deficit.       No pronator drift, finger-to-nose intact bilaterally, no motor weakness noted, grips equal bilaterally  Skin: Skin is warm, dry and intact.  No rash noted. No erythema. No pallor.  Psychiatric: He has a normal mood and affect. His speech is normal and behavior is normal. His mood  appears not anxious.    ED Course  Procedures (including critical care time)  Medications  sodium chloride 0.9 % bolus 500 mL (not administered)  0.9 %  sodium chloride infusion (not administered)  insulin aspart (novoLOG) injection 5 Units (not administered)   MRI would be the test of choice to evaluate his cerebellar system, however patient has a pacemaker and is not able to  have MRI done. CT scan was done instead.  16:10 interrogation of St Jude pacemaker reveals just PVC's, some bigminy, 79% paced beats, no other arrythmias  Patient's hyperglycemia was treated with IV insulin.  His dehydration was treated with slow IV fluid administration because of patient's age and underlying renal insufficiency.  18:04 Dr Hartford Poli admit to observation, tele, team 3  Results for orders placed during the hospital encounter of 01/09/12  CBC WITH DIFFERENTIAL      Component Value Range   WBC 5.0  4.0 - 10.5 K/uL   RBC 4.62  4.22 - 5.81 MIL/uL   Hemoglobin 12.8 (*) 13.0 - 17.0 g/dL   HCT 39.0  39.0 - 52.0 %   MCV 84.4  78.0 - 100.0 fL   MCH 27.7  26.0 - 34.0 pg   MCHC 32.8  30.0 - 36.0 g/dL   RDW 13.8  11.5 - 15.5 %   Platelets 160  150 - 400 K/uL   Neutrophils Relative 61  43 - 77 %   Neutro Abs 3.1  1.7 - 7.7 K/uL   Lymphocytes Relative 30  12 - 46 %   Lymphs Abs 1.5  0.7 - 4.0 K/uL   Monocytes Relative 7  3 - 12 %   Monocytes Absolute 0.4  0.1 - 1.0 K/uL   Eosinophils Relative 2  0 - 5 %   Eosinophils Absolute 0.1  0.0 - 0.7 K/uL   Basophils Relative 0  0 - 1 %   Basophils Absolute 0.0  0.0 - 0.1 K/uL  COMPREHENSIVE METABOLIC PANEL      Component Value Range   Sodium 131 (*) 135 - 145 mEq/L   Potassium 4.9  3.5 - 5.1 mEq/L   Chloride 98  96 - 112 mEq/L   CO2 20  19 - 32 mEq/L   Glucose, Bld 438 (*) 70 - 99 mg/dL   BUN 69 (*) 6 - 23 mg/dL    Creatinine, Ser 2.52 (*) 0.50 - 1.35 mg/dL   Calcium 10.2  8.4 - 10.5 mg/dL   Total Protein 7.7  6.0 - 8.3 g/dL   Albumin 3.4 (*) 3.5 - 5.2 g/dL   AST 26  0 - 37 U/L   ALT 31  0 - 53 U/L   Alkaline Phosphatase 109  39 - 117 U/L   Total Bilirubin 0.4  0.3 - 1.2 mg/dL   GFR calc non Af Amer 23 (*) >90 mL/min   GFR calc Af Amer 26 (*) >90 mL/min  APTT      Component Value Range   aPTT 31  24 - 37 seconds  PROTIME-INR      Component Value Range   Prothrombin Time 13.7  11.6 - 15.2 seconds   INR 1.06  0.00 - 1.49   Laboratory interpretation all normal except hyponatremia, hyperglycemia, worsening of baseline renal insufficiency   Ct Head Wo Contrast  01/09/2012  *RADIOLOGY REPORT*  Clinical Data: Syncope.  CT HEAD WITHOUT CONTRAST  Technique:  Contiguous axial images were obtained from the base of the skull through the vertex without contrast.  Comparison: None.  Findings: There is marked atrophy.  Chronic microvascular ischemic change is identified.  No evidence of acute abnormality including infarct, hemorrhage, mass lesion, mass effect, midline shift or abnormal extra-axial fluid collection.  No hydrocephalus or pneumocephalus. 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  IMPRESSION: No acute abnormality.   Original Report Authenticated By: Orlean Patten, M.D.      Date: 01/09/2012  Rate: 76  Rhythm: atrial paced rhythm, PVC's some bimeniny Intervals: PR prolonged   Narrative Interpretation:   Old EKG Reviewed: unchanged from 05/13/2008 except some PVC's    1. Vertigo   2. Near syncope   3. Renal insufficiency   4. Hyperglycemia   5. Dehydration     Plan admission   Rolland Porter, MD, FACEP  CRITICAL CARE Performed by: Rolland Porter L   Total critical care time: 33 min   Critical care time was exclusive of separately billable procedures and treating other patients.  Critical care was necessary to treat or  prevent imminent or life-threatening deterioration.  Critical care was time spent personally by me on the following activities: development of treatment plan with patient and/or surrogate as well as nursing, discussions with consultants, evaluation of patient's response to treatment, examination of patient, obtaining history from patient or surrogate, ordering and performing treatments and interventions, ordering and review of laboratory studies, ordering and review of radiographic studies, pulse oximetry and re-evaluation of patient's condition.    [place], MD 01/10/12 [phone]

## 2012-01-09 NOTE — Progress Notes (Signed)
Patient admitted to 5527-01 from ED. Patient lives at home with wife. Explained to patient to call for assistance before getting up Patient stated understanding. Placed on bedalarm. Patient's skin is warm, dry and intact. Patient's cane at bedside from home. Patient placed on telemetry. Oriented patient to unit and room. Patient's toes on rt foot are ampuated. Patient is in no pain. Will continue to monitor patient. [name]

## 2012-01-09 NOTE — Progress Notes (Signed)
Notified Baltazar Najjar, NP via text page that patient's CBG is 248 now. No orders for CBG checks or SSI. No new orders given. Will continue to monitor patient. Colin [name]

## 2012-01-09 NOTE — Progress Notes (Signed)
Baltazar Najjar, NP put in orders for CBG ACHS and SSI. Will continue to monitor patient. Ranelle Oyster, RN

## 2012-01-10 DIAGNOSIS — I501 Left ventricular failure: Secondary | ICD-10-CM

## 2012-01-10 DIAGNOSIS — R55 Syncope and collapse: Secondary | ICD-10-CM

## 2012-01-10 DIAGNOSIS — Z950 Presence of cardiac pacemaker: Secondary | ICD-10-CM

## 2012-01-10 LAB — GLUCOSE, CAPILLARY
Glucose-Capillary: 253 mg/dL — ABNORMAL HIGH (ref 70–99)
Glucose-Capillary: 270 mg/dL — ABNORMAL HIGH (ref 70–99)
Glucose-Capillary: 233 mg/dL — ABNORMAL HIGH (ref 70–99)
Glucose-Capillary: 131 mg/dL — ABNORMAL HIGH (ref 70–99)
Glucose-Capillary: 55 mg/dL — ABNORMAL LOW (ref 70–99)

## 2012-01-10 LAB — CBC
HCT: 34.4 % — ABNORMAL LOW (ref 39.0–52.0)
Hemoglobin: 11.3 g/dL — ABNORMAL LOW (ref 13.0–17.0)
MCV: 83.7 fL (ref 78.0–100.0)
WBC: 3.7 10*3/uL — ABNORMAL LOW (ref 4.0–10.5)

## 2012-01-10 LAB — RAPID URINE DRUG SCREEN, HOSP PERFORMED
Amphetamines: NOT DETECTED
Barbiturates: NOT DETECTED
Benzodiazepines: NOT DETECTED
Tetrahydrocannabinol: NOT DETECTED

## 2012-01-10 LAB — BASIC METABOLIC PANEL
BUN: 65 mg/dL — ABNORMAL HIGH (ref 6–23)
CO2: 21 mEq/L (ref 19–32)
Chloride: 104 mEq/L (ref 96–112)
Creatinine, Ser: 2.27 mg/dL — ABNORMAL HIGH (ref 0.50–1.35)
GFR calc Af Amer: 30 mL/min — ABNORMAL LOW (ref >90)
Potassium: 4.1 mEq/L (ref 3.5–5.1)

## 2012-01-10 LAB — HEMOGLOBIN A1C
Hgb A1c MFr Bld: 10.3 % — ABNORMAL HIGH (ref <5.7)
Mean Plasma Glucose: 249 mg/dL — ABNORMAL HIGH (ref <117)

## 2012-01-10 LAB — MAGNESIUM: Magnesium: 2 mg/dL (ref 1.5–2.5)

## 2012-01-10 MED ORDER — INSULIN DETEMIR 100 UNIT/ML ~~LOC~~ SOLN: 20.0000 [IU] | Freq: Every day | SUBCUTANEOUS | Status: DC

## 2012-01-10 MED ORDER — INSULIN ASPART PROT & ASPART (70-30 MIX) 100 UNIT/ML ~~LOC~~ SUSP
17.0000 [IU] | Freq: Every day | SUBCUTANEOUS | Status: DC
  Filled 2012-01-10: qty 3

## 2012-01-10 MED ORDER — INSULIN ASPART PROT & ASPART (70-30 MIX) 100 UNIT/ML ~~LOC~~ SUSP
20.0000 [IU] | Freq: Every day | SUBCUTANEOUS | Status: DC
  Filled 2012-01-10: qty 10

## 2012-01-10 MED ORDER — ASPIRIN EC 81 MG PO TBEC
81.0000 mg | DELAYED_RELEASE_TABLET | Freq: Every day | ORAL | Status: DC
  Administered 2012-01-10 – 2012-01-11 (×2): 81 mg via ORAL
  Filled 2012-01-10 (×2): qty 1

## 2012-01-10 MED ORDER — INSULIN ASPART PROT & ASPART (70-30 MIX) 100 UNIT/ML ~~LOC~~ SUSP
20.0000 [IU] | Freq: Two times a day (BID) | SUBCUTANEOUS | Status: DC
  Administered 2012-01-10: 20 [IU] via SUBCUTANEOUS
  Filled 2012-01-10: qty 3

## 2012-01-10 MED ORDER — INSULIN ASPART PROT & ASPART (70-30 MIX) 100 UNIT/ML ~~LOC~~ SUSP
20.0000 [IU] | Freq: Every day | SUBCUTANEOUS | Status: DC
  Filled 2012-01-10: qty 3

## 2012-01-10 NOTE — Progress Notes (Signed)
Inpatient Diabetes Program Recommendations  AACE/ADA: New Consensus Statement on Inpatient Glycemic Control (2013)  Target Ranges:  Prepandial:   less than 140 mg/dL      Peak postprandial:   less than 180 mg/dL (1-2 hours)      Critically ill patients:  140 - 180 mg/dL   Results for HAVOC, GOULETTE (MRN [id number]) as of 01/10/2012 12:57  Ref. Range 01/09/2012 19:55 01/09/2012 20:40 01/09/2012 21:58 01/10/2012 07:55 01/10/2012 12:04  Glucose-Capillary Latest Range: 70-99 mg/dL 263 (H) 248 (H) 253 (H) 270 (H) 233 (H)    Inpatient Diabetes Program Recommendations Insulin - Basal: Please consider ordering basal insulin.  Recommend Levemir 20 units QHS.  Note: Patient has diabetes and takes Humalog 50/50 28 units in the morning and 22 units in the evening at home for diabetes management.  As an inpatient, only Novolog moderate correction scale ordered ACHS.  Please consider starting patient on basal insulin.  Recommend Levemir 20 units QHS which is calculated based off his home dose of Humalog 50/50 ((28+22= 50) x 0.5 x 0.8 = 20 units of basal per day).  Will continue to follow.  Thanks, Barnie Alderman, RN, BSN, [place] Diabetes Coordinator Inpatient Diabetes Program [phone]

## 2012-01-10 NOTE — Progress Notes (Signed)
[facility] PROGRESS NOTE  Colin [name] [id number] DOB: Dec 02, 1928 DOA: 01/09/2012 PCP: Horatio Pel, MD  Assessment/Plan:  Syncope (4 episodes in the past year)  [place] Consultation  Likely orthostatic syncope.  Cards recommends - d/c lasix, flomax if possible.  Add compression stockings.  Add mestinon 30-60 mg bid if other options fail.  Cardiac enzymes are negative thus far.  Pacemaker prevents MRI.  Coronary artery disease-status post CABG 2004   Congestive heart failure-EF of 30 -35%  Stable.   On beta blocker and Zetia Lasix currently being held due to orthostatic syncope.  Peripheral vascular disease (right midfoot amputation) Stable.   Diabetes mellitus - Uncontrolled.  Hgb A1C 10.3 On Humalog 50/50 at home. Will attempt to use his home meds (70/30 is the closest we have) and adjust dose.  Hypertension  With orthostatic hypotension when standing. Moderately controlled on BB.  With Some bradycardia (do not want to increase Beta Blockade)  Acute on Chronic renal insufficiency with baseline creatinine around 2.0 Creatinine improving with IVF  Lasix d/c'd.  Avoid nephrotoxic medications.  Hx of recurrent prostate CA. Holding flomax due to orthostatic syncope.  Code Status: full Family Communication:  Disposition Plan: to home possibly 12/19 if improved.   Consultants:  Cardiology [facility]) Consultation requested.  Procedures:    Antibiotics:    HPI/Subjective: Patient without complaints.  Asking when he can go home.  Objective: Filed Vitals:   01/09/12 1945 01/09/12 2019 01/10/12 0525 01/10/12 0528  BP: 150/51 164/67 146/62   Pulse: 57 71 49 65  Temp:  98.3 F (36.8 C) 98.2 F (36.8 C)   TempSrc:  Oral Oral   Resp: 16 20 16    Height:  5\' 11"  (1.803 m)    Weight:  85.2 kg (187 lb 13.3 oz)    SpO2: 98% 100% 98%     Intake/Output Summary (Last 24 hours) at 01/10/12 0949 Last data filed at  01/10/12 0559  Gross per 24 hour  Intake    685 ml  Output      0 ml  Net    685 ml   Filed Weights   01/09/12 2019  Weight: 85.2 kg (187 lb 13.3 oz)    Exam:   General:  Awake, orientated, NAD  Cardiovascular: RRR, no M/R/G  Respiratory: CTA, no W/C/R, no Accessory muscle use.  Abdomen: Soft NT, ND, +BS  Data Reviewed: Basic Metabolic Panel:  Lab [id number] 01/09/12 1346  NA 135 131*  K 4.1 4.9  CL 104 98  CO2 21 20  GLUCOSE 285* 438*  BUN 65* 69*  CREATININE 2.27* 2.52*  CALCIUM 9.6 10.2  MG 2.0 --  PHOS -- --   Liver Function Tests:  Lab 01/09/12 1346  AST 26  ALT 31  ALKPHOS 109  BILITOT 0.4  PROT 7.7  ALBUMIN 3.4*   CBC:  Lab 01/10/12 0205 01/09/12 1346  WBC 3.7* 5.0  NEUTROABS -- 3.1  HGB 11.3* 12.8*  HCT 34.4* 39.0  MCV 83.7 84.4  PLT 159 160   Cardiac Enzymes:  Lab 01/10/12 0205 01/09/12 2044  CKTOTAL -- --  CKMB -- --  CKMBINDEX -- --  TROPONINI <0.30 <0.30   BNP (last 3 results)  Basename 06/09/11 1122 04/10/11 1034  PROBNP 92.0 425.0*   CBG:  Lab 01/10/12 0755 01/09/12 2158 01/09/12 2040 01/09/12 1955  GLUCAP 270* 253* 248* 263*    Studies: Ct Head Wo Contrast  01/09/2012  *RADIOLOGY REPORT*  Clinical Data:  Syncope.  CT HEAD WITHOUT CONTRAST  Technique:  Contiguous axial images were obtained from the base of the skull through the vertex without contrast.  Comparison: None.  Findings: There is marked atrophy.  Chronic microvascular ischemic change is identified.  No evidence of acute abnormality including infarct, hemorrhage, mass lesion, mass effect, midline shift or abnormal extra-axial fluid collection.  No hydrocephalus or pneumocephalus. 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  IMPRESSION: No acute abnormality.   Original Report Authenticated By: Orlean Patten, M.D.     Scheduled Meds:    . sodium chloride   Intravenous Once  . B-complex with vitamin  C  1 tablet Oral Daily  . carvedilol  25 mg Oral BID  . ezetimibe  10 mg Oral Daily  . heparin  5,000 Units Subcutaneous Q8H  . insulin aspart  0-15 Units Subcutaneous TID WC  . insulin aspart  0-5 Units Subcutaneous QHS  . pantoprazole  40 mg Oral Daily  . sodium chloride  3 mL Intravenous Q12H  . Tamsulosin HCl  0.4 mg Oral Daily   Continuous Infusions:    . sodium chloride 75 mL/hr at 01/09/12 2051    Principal Problem:  *Syncope Active Problems:  Cardiac pacemaker in situ  Acute kidney injury  CKD (chronic kidney disease), stage III  Diabetes mellitus with peripheral artery disease    Time spent: 35 min.    Karen Kitchens [facility] Pager [phone]. If 8PM-8AM, please contact night-coverage at [email or web address], password [facility] 01/10/2012, 9:49 AM  LOS: 1 day    Attending  I have seen and examined the patient, I agree with the assessment and plan as outlined above. Will stop IVF, continue to hold Lasix, recheck orthostatics in am. Appreciate cardiology input  S Ghimire

## 2012-01-10 NOTE — Progress Notes (Signed)
Hypoglycemic Event  CBG: 55  Treatment: 15 GM carbohydrate snack  Symptoms: None  Follow-up CBG: Time:2215 CBG Result:97  Possible Reasons for Event: Unknown  Comments/MD notified: Baltazar Najjar, NP    Juanetta Beets E  Remember to initiate Hypoglycemia Order Set & complete

## 2012-01-10 NOTE — Evaluation (Signed)
Physical Therapy Evaluation Patient Details Name: Colin [name] MRN: [id number] DOB: Sep 09, 1928 Today's Date: 01/10/2012 Time: [id number] PT Time Calculation (min): 13 min  PT Assessment / Plan / Recommendation Clinical Impression  patient admitted s/p sycopal episode.  Patient modified independent for all mobility and no loss of balance noted.  No complaints of dizziness.  No further PT indicated.  Will sign off.    PT Assessment  Patent does not need any further PT services    Follow Up Recommendations  No PT follow up    Does the patient have the potential to tolerate intense rehabilitation      Barriers to Discharge        Equipment Recommendations  None recommended by PT    Recommendations for Other Services     Frequency      Precautions / Restrictions Precautions Precautions: Fall   Pertinent Vitals/Pain No pain      Mobility  Bed Mobility Bed Mobility: Supine to Sit;Sit to Supine Supine to Sit: 7: Independent;HOB flat Sit to Supine: 7: Independent;HOB flat Transfers Transfers: Sit to Stand;Stand to Sit Sit to Stand: 6: Modified independent (Device/Increase time);Without upper extremity assist Stand to Sit: 6: Modified independent (Device/Increase time);Without upper extremity assist Ambulation/Gait Ambulation/Gait Assistance: 6: Modified independent (Device/Increase time) Ambulation Distance (Feet): 100 Feet Assistive device: Straight cane Ambulation/Gait Assistance Details: patient with partial amputation of right foot affecting gait pattern Gait Pattern: Within Functional Limits    Shoulder Instructions     Exercises     PT Diagnosis:    PT Problem List:   PT Treatment Interventions:     PT Goals    Visit Information  Last PT Received On: 01/10/12 Assistance Needed: +1    Subjective Data  Subjective: no concerns Patient Stated Goal: I hope to go home today   Prior Functioning  Home Living Lives With: Spouse Available Help at  Discharge: Family Type of Home: House Home Access: Stairs to enter Technical brewer of Steps: 2 Home Layout: One level Home Adaptive Equipment: Straight cane Prior Function Level of Independence: Independent with assistive device(s) Communication Communication: No difficulties    Cognition  Overall Cognitive Status: Appears within functional limits for tasks assessed/performed Arousal/Alertness: Awake/alert Orientation Level: Appears intact for tasks assessed Behavior During Session: [facility] for tasks performed    Extremity/Trunk Assessment Right Upper Extremity Assessment RUE ROM/Strength/Tone: [facility] for tasks assessed Left Upper Extremity Assessment LUE ROM/Strength/Tone: [facility] for tasks assessed Right Lower Extremity Assessment RLE ROM/Strength/Tone: [facility] for tasks assessed Left Lower Extremity Assessment LLE ROM/Strength/Tone: [facility] for tasks assessed   Balance Balance Balance Assessed: Yes Static Sitting Balance Static Sitting - Balance Support: No upper extremity supported Static Sitting - Level of Assistance: 7: Independent Static Standing Balance Static Standing - Balance Support: Right upper extremity supported Static Standing - Level of Assistance: 6: Modified independent (Device/Increase time) High Level Balance High Level Balance Activites: Direction changes;Sudden stops High Level Balance Comments: reaching to floor, perturbations with no loss of balance  End of Session PT - End of Session Activity Tolerance: Patient tolerated treatment well Patient left: in bed Nurse Communication: Mobility status  GP Functional Assessment Tool Used: clinical judgement Functional Limitation: Mobility: Walking and moving around Mobility: Walking and Moving Around Current Status [id number]): At least 1 percent but less than 20 percent impaired, limited or restricted Mobility: Walking and Moving Around Goal Status [phone]): At least 1 percent but less than 20 percent impaired, limited or  restricted Mobility: Walking and Moving Around Discharge  Status [phone]): At least 1 percent but less than 20 percent impaired, limited or restricted   Shanna Cisco, [place] 01/10/2012, 1:50 PM

## 2012-01-10 NOTE — Progress Notes (Signed)
Notified Baltazar Najjar, NP that patient having frequent PVC's and had 4 beats of VT. Patient is asymptomatic and asleep. Baltazar Najjar, NP put in order for magnesium lab. Will continue to monitor. Ranelle Oyster, RN

## 2012-01-10 NOTE — Care Management Note (Signed)
    Page 1 of 2   01/11/2012     10:19:53 AM   CARE MANAGEMENT NOTE 01/11/2012  Patient:  MONTIE, ZAJAC   Account Number:  [id number]  Date Initiated:  01/10/2012  Documentation initiated by:  Tomi Bamberger  Subjective/Objective Assessment:   dx syncope  admit as observation- lives with spouse. uses a single point cane.     Action/Plan:   pt eval   Anticipated DC Date:  01/11/2012   Anticipated DC Plan:  [place]  CM consult      [facility] Choice  HOME HEALTH   Choice offered to / List presented to:  C-1 Patient        [place] arranged  HH-1 RN  [place].   Status of service:  Completed, signed off Medicare Important Message given?   (If response is "NO", the following Medicare IM given date fields will be blank) Date Medicare IM given:   Date Additional Medicare IM given:    Discharge Disposition:  [place]  Per UR Regulation:  Reviewed for med. necessity/level of care/duration of stay  If discussed at [place] of Stay Meetings, dates discussed:    Comments:  01/11/12 10:18 Tomi Bamberger RN, BSN [phone] patient for dc today, patient will need hhpt and hhrn, patient [facility] from [facility] list, referral made to [facility], Butch Penny notified.  Soc will begin 24-48 hrs post discharge. Patient has transportation at discharge.   01/10/12 11:26 Tomi Bamberger RN, BSN [phone] patient lives with spouse, patient uses a single point cane, await pt eval, patient for possible dc today, patient has medication coverage and transportation.  NCM will continue to follow for dc needs.

## 2012-01-10 NOTE — Consult Note (Signed)
ELECTROPHYSIOLOGY CONSULT NOTE  Patient ID: Colin [name], MRN: [id number], DOB/AGE: July 15, 1928 78 y.o. Admit date: 01/09/2012 Date of Consult: 01/10/2012  Primary Physician: Horatio Pel, MD Primary Cardiologist: Pnahser  Chief Complaint:  syncope   HPI Colin [name] is a 78 y.o. male  Admitted with syncope. He has had recurrent syncope most of the episode occurring last 1-2 years with 3 episodes in the last 6 months. All of these have occurred while standing to come in shortly after standing in the episode that prompted this admission, he been standing talking to his granddaughter on the telephone for about 10 minutes.  He is some degree of shower intolerance and orthostatic intolerance. He's had no problems with palpitations. He has a pacemaker implanted for reasons that are not clear to him but is not sure that was syncope. He has ischemic heart disease with prior bypass surgery and most recent assessment of ejection fraction EF 30-35% by echo 4/13 He denies dyspnea on exertion. He has no peripheral edema. He notes nonreproducible exertionally associated chest pains have been quite stable over the last couple of years.  He has significant hypertension and diabetes. He is grew insufficiency with creatinines around the range of 2. He has prostatism and takes Flomax; he is not sure how long he has been on this medication.   Past Medical History  Diagnosis Date  . Coronary artery disease     Status post CABG in 2004  . Peripheral vascular disease     Status post right femoropopliteal bypass  . Dyslipidemia   . Diabetes mellitus with peripheral artery disease   . Hypertension   . Chronic renal insufficiency     creatinine around 2.4  . CHF (congestive heart failure) 2007    ef 38%  . History of CVA (cerebrovascular accident)   . Cardiomyopathy   . COPD (chronic obstructive pulmonary disease)   . History of anemia       Surgical History:  Past Surgical History   Procedure Date  . Insert / replace / remove pacemaker 05/17/2005    [facility] dual chamber ddd mode   . Coronary artery bypass graft 4 of 2004    4 vessel bypass  . Femoral bypass july 2006    right with right great toe amputation  . Cardiac catheterization 04/29/2002    Dr. Glade Lloyd  . Esophagogastroduodenoscopy 07/21/2011    Procedure: ESOPHAGOGASTRODUODENOSCOPY (EGD);  Surgeon: Beryle Beams, MD;  Location: [facility];  Service: Endoscopy;  Laterality: N/A;     Home Meds: Prior to Admission medications   Medication Sig Start Date End Date Taking? Authorizing Provider  B Complex-C (B-COMPLEX WITH VITAMIN C) tablet Take 1 tablet by mouth daily.   Yes Historical Provider, MD  carvedilol (COREG) 25 MG tablet Take 1 tablet (25 mg total) by mouth 2 (two) times daily. 12/07/11  Yes Burtis Junes, NP  Cyanocobalamin (VITAMIN B 12 PO) Take by mouth. 1000 mcg daily    Yes Historical Provider, MD  ezetimibe (ZETIA) 10 MG tablet Take 10 mg by mouth daily.     Yes Historical Provider, MD  furosemide (LASIX) 40 MG tablet Take 40 mg by mouth daily. Take 1 tablet Monday Wednesday Friday and Saturday. 06/12/11  Yes Thayer Headings, MD  insulin lispro protamine-insulin lispro (HUMALOG 50/50) (50-50) 100 UNIT/ML SUSP Inject 15-30 Units into the skin 2 (two) times daily before a meal. 28 units in the am and 22 in the pm  Yes Historical Provider, MD  Omega-3 Fatty Acids (FISH OIL) 1000 MG CAPS Take 1,000 mg by mouth daily.    Yes Historical Provider, MD  omeprazole (PRILOSEC) 40 MG capsule Take 40 mg by mouth daily as needed. Acid indigestion   Yes Historical Provider, MD  Tamsulosin HCl (FLOMAX) 0.4 MG CAPS Take 0.4 mg by mouth daily.   Yes Historical Provider, MD    Inpatient Medications:    . sodium chloride   Intravenous Once  . B-complex with vitamin C  1 tablet Oral Daily  . carvedilol  25 mg Oral BID  . ezetimibe  10 mg Oral Daily  . heparin  5,000 Units Subcutaneous Q8H  . insulin aspart   0-15 Units Subcutaneous TID WC  . insulin aspart  0-5 Units Subcutaneous QHS  . pantoprazole  40 mg Oral Daily  . sodium chloride  3 mL Intravenous Q12H  . Tamsulosin HCl  0.4 mg Oral Daily      Allergies:  Allergies  Allergen Reactions  . Lisinopril     Elevated potassium,renal insuf    History   Social History  . Marital Status: Married    Spouse Name: N/A    Number of Children: N/A  . Years of Education: N/A   Occupational History  . Not on file.   Social History Main Topics  . Smoking status: Former Smoker    Quit date: 06/09/2002  . Smokeless tobacco: Never Used  . Alcohol Use: No  . Drug Use: No  . Sexually Active: Not Currently   Other Topics Concern  . Not on file   Social History Narrative  . No narrative on file     Family History  Problem Relation Age of Onset  . Hyperlipidemia Mother   . Heart disease Father      ROS:  Please see the history of present illness.     All other systems reviewed and negative.    Physical Exam:  Blood pressure 143/71, pulse 63, temperature 98.2 F (36.8 C), temperature source Oral, resp. rate 16, height 5\' 11"  (1.803 m), weight 187 lb 13.3 oz (85.2 kg), SpO2 98.00%. General: Well developed, well nourished age appearing 40 American  male in no acute distress. Head: Normocephalic, atraumatic, sclera non-icteric, no xanthomas, nares are without discharge. Lymph Nodes:  none Back: without scoliosis/kyphosis, no CVA tendersness Neck: Negative for carotid bruits. JVDbv 6-7 cm  Lungs: Clear bilaterally to auscultation without wheezes, rales, or rhonchi. Breathing is unlabored. Heart: RRR with S1 S2. There is an early systolic murmur along the right sternal border. There is an S4  Abdomen: Soft, non-tender, non-distended with normoactive bowel sounds. No hepatomegaly. No rebound/guarding. No obvious abdominal masses. Msk:  Strength and tone appear normal for age. Extremities: No clubbing or cyanosis. No edema.  Distal  pedal pulses are 2+ and equal bilaterally. Skin: Warm and Dry Neuro: Alert and oriented X 3. CN III-XII intact Grossly normal sensory and motor function . Psych:  Responds to questions appropriately with a normal affect.      Labs: Cardiac Enzymes  Basename 01/10/12 0904 01/10/12 0205 01/09/12 2044  CKTOTAL -- -- --  CKMB -- -- --  TROPONINI <0.30 <0.30 <0.30   CBC Lab Results  Component Value Date   WBC 3.7* 01/10/2012   HGB 11.3* 01/10/2012   HCT 34.4* 01/10/2012   MCV 83.7 01/10/2012   PLT 159 01/10/2012   PROTIME:  Basename 01/09/12 1525  LABPROT 13.7  INR 1.06   Chemistry  Lab 01/10/12 0205 01/09/12 1346  NA 135 --  K 4.1 --  CL 104 --  CO2 21 --  BUN 65* --  CREATININE 2.27* --  CALCIUM 9.6 --  PROT -- 7.7  BILITOT -- 0.4  ALKPHOS -- 109  ALT -- 31  AST -- 26  GLUCOSE 285* --   Lipids Lab Results  Component Value Date   CHOL 160 06/09/2011   HDL 32.40* 06/09/2011   LDLCALC 110* 06/09/2011   TRIG 89.0 06/09/2011   BNP Pro B Natriuretic peptide (BNP)  Date/Time Value Range Status  06/09/2011 11:22 AM 92.0  0.0 - 100.0 pg/mL Final  04/10/2011 10:34 AM 425.0* 0.0 - 100.0 pg/mL Final   Miscellaneous No results found for this basename: DDIMER    Radiology/Studies:  Ct Head Wo Contrast  01/09/2012  *RADIOLOGY REPORT*  Clinical Data: Syncope.  CT HEAD WITHOUT CONTRAST  Technique:  Contiguous axial images were obtained from the base of the skull through the vertex without contrast.  Comparison: None.  Findings: There is marked atrophy.  Chronic microvascular ischemic change is identified.  No evidence of acute abnormality including infarct, hemorrhage, mass lesion, mass effect, midline shift or abnormal extra-axial fluid collection.  No hydrocephalus or pneumocephalus. 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  IMPRESSION: No acute abnormality.   Original Report Authenticated By: Orlean Patten,  M.D.     EKG:  Atrially paced rhythm with a neural QRS with normal conduction and occasional PVCs  Pacemaker interrogation demonstrated normal device function   Assessment and Plan:   Patient Active Hospital Problem List: Syncope (01/09/2012)   Cardiac pacemaker in situ (11/30/2009)    CKD (chronic kidney disease), stage III (01/09/2012)   Diabetes mellitus with peripheral artery disease ()    Story is most consistent with orthostatic syncope. He has had modest orthostatic change in evaluation in hospital with 20-30 mm drops in blood pressure. Interrogation of his device failed to reveal even ventricular arrhythmias atrial arrhythmias the bradycardia pacing functions were normal making an arrhythmia extremely unlikely.  He has diabetes. He takes Flomax as an alpha blocker. His BUN/creatinine ratio suggests intravascular volume depletion.  The above suggests a couple of courses. The first is the instruction and isometric contraction of the way to augment venous return and mitigate some of the orthostatic stress. Compression stockings can also be used me should be at least thigh. 30 to 40 mmHg is recommended. I don't know if it's possible to stop the Lasix but I would do that and use it on an as-needed basis. Reassessment of BUN/creatinine would be appropriate.  The Flomax may be contributing to this if he can be stopped should be considered. If the aforementioned options do not work Mestinon 30-60 mg twice daily can be used to help mitigate orthostatic hypotension. Thank you very much for the consultation anticipate the patient followed up with Dr. Nita Sickle M.D.    Virl Axe

## 2012-01-11 DIAGNOSIS — I428 Other cardiomyopathies: Secondary | ICD-10-CM

## 2012-01-11 DIAGNOSIS — R3129 Other microscopic hematuria: Secondary | ICD-10-CM | POA: Diagnosis present

## 2012-01-11 LAB — BASIC METABOLIC PANEL
BUN: 51 mg/dL — ABNORMAL HIGH (ref 6–23)
CO2: 22 mEq/L (ref 19–32)
Chloride: 106 mEq/L (ref 96–112)
Creatinine, Ser: 1.95 mg/dL — ABNORMAL HIGH (ref 0.50–1.35)
GFR calc Af Amer: 36 mL/min — ABNORMAL LOW (ref >90)
Potassium: 4.4 mEq/L (ref 3.5–5.1)

## 2012-01-11 MED ORDER — FUROSEMIDE 40 MG PO TABS: 20.0000 mg | ORAL_TABLET | Freq: Every day | ORAL | Status: DC

## 2012-01-11 MED ORDER — INSULIN NPH ISOPHANE & REGULAR (70-30) 100 UNIT/ML ~~LOC~~ SUSP: 17.0000 [IU] | Freq: Two times a day (BID) | SUBCUTANEOUS | Status: DC

## 2012-01-11 MED ORDER — INSULIN ASPART PROT & ASPART (70-30 MIX) 100 UNIT/ML ~~LOC~~ SUSP
18.0000 [IU] | Freq: Every day | SUBCUTANEOUS | Status: DC
  Administered 2012-01-11: 18 [IU] via SUBCUTANEOUS
  Filled 2012-01-11: qty 3

## 2012-01-11 MED ORDER — ASPIRIN 81 MG PO TBEC: 81.0000 mg | DELAYED_RELEASE_TABLET | Freq: Every day | ORAL | Status: DC

## 2012-01-11 MED ORDER — "NEEDLE (DISP) 18G X 1-1/2"" MISC": 1.0000 | Freq: Two times a day (BID) | Status: DC

## 2012-01-11 NOTE — Progress Notes (Signed)
Inpatient Diabetes Program Recommendations  AACE/ADA: New Consensus Statement on Inpatient Glycemic Control (2013)  Target Ranges:  Prepandial:   less than 140 mg/dL      Peak postprandial:   less than 180 mg/dL (1-2 hours)      Critically ill patients:  140 - 180 mg/dL      Note: Asked to recommend home insulin regimen for patient that was comparable to the insulin doses received here in the hospital.  Talked with the patient and at home he takes Humalog 50/50 28 units with breakfast and 22 units with supper.  He gets Humalog from [facility], so I called and asked what his cost was with his insurance and his cost is $45.00 out of pocket.  Patient would be able to get Novolin 70/30 (which is what is ordered for him here in the hospital) for $25 a vial.  Therefore recommend that Novolin 70/30 17 units BID be ordered for patient in place of the Humalog 50/50 as an outpatient.  Thanks, Barnie Alderman, RN, BSN, [facility] Coordinator Inpatient Diabetes Program [phone]

## 2012-01-11 NOTE — Progress Notes (Signed)
01/11/12 1000  Clinical Encounter Type  Visited With Patient  Visit Type Initial   Visited with the patient. He said he was doing well and going home today. Joesph Fillers

## 2012-01-11 NOTE — Progress Notes (Signed)
PROGRESS NOTE  Subjective:   [name] is an 78 yo with hx of CHF ( EF 30-35%), CKD, pacer, HTN, COPD admitted with syncope.  He was found to be orthostatic.   flomax was held.  Lasix has been held.   Objective:    Vital Signs:   Temp:  [97.7 F (36.5 C)-98.4 F (36.9 C)] 97.9 F (36.6 C) (12/19 0500) Pulse Rate:  [49-75] 72  (12/19 0500) Resp:  [14-18] 14  (12/19 0500) BP: (122-164)/(50-85) 130/67 mmHg (12/19 0500) SpO2:  [98 %-99 %] 98 % (12/19 0500)  Last BM Date: 01/10/12   24-hour weight change: Weight change:   Weight trends: Filed Weights   01/09/12 2019  Weight: 187 lb 13.3 oz (85.2 kg)    Intake/Output:  12/18 0701 - 12/19 0700 In: 3 [I.V.:3] Out: 1000 [Urine:1000]     Physical Exam: BP 130/67  Pulse 72  Temp 97.9 F (36.6 C) (Oral)  Resp 14  Ht 5\' 11"  (1.803 m)  Wt 187 lb 13.3 oz (85.2 kg)  BMI 26.20 kg/m2  SpO2 98%  General: Vital signs reviewed and noted.   Head: Normocephalic, atraumatic.  Eyes: conjunctivae/corneas clear.  EOM's intact.   Throat: normal  Neck:  normal   Lungs:    clear  Heart:  reg with frequent premature beats.  Abdomen:  Soft, non-tender, non-distended    Extremities:  no edema, compression hose on  Neurologic: A&O X3, CN II - XII are grossly intact.   Psych: Normal     Labs: BMET:  Basename 01/10/12 0205 01/09/12 1346  NA 135 131*  K 4.1 4.9  CL 104 98  CO2 21 20  GLUCOSE 285* 438*  BUN 65* 69*  CREATININE 2.27* 2.52*  CALCIUM 9.6 10.2  MG 2.0 --  PHOS -- --    Liver function tests:  Basename 01/09/12 1346  AST 26  ALT 31  ALKPHOS 109  BILITOT 0.4  PROT 7.7  ALBUMIN 3.4*   No results found for this basename: LIPASE:2,AMYLASE:2 in the last 72 hours  CBC:  Basename 01/10/12 0205 01/09/12 1346  WBC 3.7* 5.0  NEUTROABS -- 3.1  HGB 11.3* 12.8*  HCT 34.4* 39.0  MCV 83.7 84.4  PLT 159 160    Cardiac Enzymes:  Basename 01/10/12 0904 01/10/12 0205 01/09/12 2044  CKTOTAL -- -- --  CKMB  -- -- --  TROPONINI <0.30 <0.30 <0.30    Coagulation Studies:  Basename 01/09/12 1525  LABPROT 13.7  INR 1.06    Other: No components found with this basename: POCBNP:3 No results found for this basename: DDIMER in the last 72 hours  Basename 01/09/12 2044  HGBA1C 10.3*       Medications:    Infusions:    Scheduled Medications:    . aspirin EC  81 mg Oral Daily  . B-complex with vitamin C  1 tablet Oral Daily  . carvedilol  25 mg Oral BID  . ezetimibe  10 mg Oral Daily  . heparin  5,000 Units Subcutaneous Q8H  . insulin aspart protamine-insulin aspart  17 Units Subcutaneous Q supper  . insulin aspart protamine-insulin aspart  20 Units Subcutaneous Q breakfast  . pantoprazole  40 mg Oral Daily  . sodium chloride  3 mL Intravenous Q12H    Assessment/ Plan:    Syncope (01/09/2012)  seems to be due to orthostasis at this point.  Pacer interrogation is OK.  He may be able to go home today. I think Lasix  4 times a week will be OK. He can call our office if he develops more dizziness.  Cardiac pacemaker in situ (11/30/2009) Pacer interrogation  Chronic left-sided CHF (congestive heart failure) ()  continue coreg today.  Acute kidney injury () On chronic renal failure.  Baseline creatinine is 2.2-2.5  CKD (chronic kidney disease), stage III (01/09/2012)  Diabetes mellitus with peripheral artery disease () His glucose levels are very high.  This may be causing an osmotic diuresis which may be exacerbating his orthostasis.  Disposition:  Length of Stay: 2  Thayer Headings, Brooke Bonito., MD, [facility] 01/11/2012, 8:04 AM Office [phone] Pager [phone]

## 2012-01-11 NOTE — Discharge Summary (Signed)
Physician Discharge Summary  Colin [name] [id number] DOB: 1928-05-03 DOA: 01/09/2012  PCP: Horatio Pel, MD  Admit date: 01/09/2012 Discharge date: 01/11/2012  Time spent: 40 minutes  Recommendations for Outpatient Follow-up:  Home Health RN to check for orthostatic hypotension, and monitor CBGs, ensure insulin is being taken correctly. Follow up with Primary Care Physician in 1 - 2 weeks.  Patient with microscopic hematuria and uncontrolled DM. Follow up with Cardiology in 2 weeks.  CHF on reduced diuretic regimen.  Lasix has been decreased Flomax has been discontinued. Thigh high Ted hose have been placed on the patient for use when out of bed. Insulin has been changed.  Discharge Diagnoses:  Principal Problem:  *Syncope Active Problems:  Cardiac pacemaker in situ  Chronic left-sided CHF (congestive heart failure)  Acute kidney injury  CKD (chronic kidney disease), stage III  Diabetes mellitus with peripheral artery disease  Microscopic hematuria   Discharge Condition: Stable, improved, but still with orthostatic hypotension.  Diet recommendation: carb modified  Filed Weights   01/09/12 2019  Weight: 85.2 kg (187 lb 13.3 oz)    History of present illness:  Chief Complaint: Syncope  HPI: Colin [name] is a 78 y.o. male with past medical history of CAD status post CABG in [id number], has systolic CHF with LVEF of 38%, diabetes mellitus type 2 and CKD stage III. Sent from his primary care physician office today because of syncope. Patient reported that he was in his usual state of health till yesterday, he was on the phone with  his daughter and when he finished his phone call he felt dizzy and collapsed. He felt that the room spinning around him for one minute, he never lost his consciousness, after his collapse his wife came in and helped him to get to a nearby chair. He denies any chest pain, SOB or palpitations. He reported his blood sugar was okay at that  time. He went to see his primary care physician today he was found to have slightly higher creatinine than his baseline so he was sent to the hospital for further evaluation. In the emergency department the creatinine is 2.5, his baseline is 1.9 in October of 2013, had negative CT scan of the head.   Hospital Course:  Orthostatic Syncope (4 episodes in the past year)   The patient was found to have orthostatic hypotension when standing.  Cardiology was consulted as he appeared to have some arrthymias on telemetry.  Cardiology interrogated the pacemaker and did not find cause for syncope.  They recommended thigh high TED hose, decreased lasix and discontinue flomax.  If these measures fail, they suggested considering Mestinon 30 - 60 mg bid.  Cardiac enzymes were negative.  CT head was negative.  Coronary artery disease-status post CABG 2004  Placed on aspirin 81 mg. daily  Congestive heart failure-EF of 30 -35%  Stable. No issues during this hospitalization.  Continue on beta blocker and Zetia.  Lasix was held during this admission and will be restarted at a lower dose (20 mg daily, 4 days a week)   Peripheral vascular disease (right midfoot amputation)  Stable.   Diabetes mellitus - Uncontrolled. Hgb A1C 10.3  Was on Humalog 50/50 at home.  Hgb A1C is elevated.  This could be contributing to a hyperosmolar state worsening his orthostatic hypotension.  We have started him on Novolin 70/30 (it is less expensive) at an adjusted dose. The patient reports compliance with his insulin regimen, but his Hgb A1C does not  reflect compliance.  We will ask home health RN to monitor cbgs and offer insulin teaching as well.  Hypertension  With orthostatic hypotension when standing.  Hypertension moderately controlled on BB. With Some bradycardia (do not want to increase Beta Blockade)   Acute on Chronic renal insufficiency with baseline creatinine around 2.0  Creatinine improving with IVF.  Lasix reduced.   Would avoid nephrotoxic medications.  Microscopic Hematuria Patient noted to have red blood cells to numerous to count on U/A.  Stable for outpatient follow up.  Asymptomatic.  Does not appear to have infection.  Will ask patient to follow up with his primary care physician.   Hx of recurrent prostate CA.  Holding flomax due to orthostatic syncope.  Consultations:  [facility]  Discharge Exam: Filed Vitals:   01/11/12 0500 01/11/12 0821 01/11/12 0826 01/11/12 0833  BP: 130/67 150/51 148/68 111/68  Pulse: 72 66 64 67  Temp: 97.9 F (36.6 C)     TempSrc: Oral     Resp: 14     Height:      Weight:      SpO2: 98%       General: Alert and orientated, NAD, Requesting discharge Cardiovascular: RRR no M/R/G Respiratory: CTA no W/C/R  Abdomen: soft, nt, nd, +BS, no masses Musculoskeletal:  Able to move all 4    Discharge Instructions      Discharge Orders    Future Orders Please Complete By Expires   Diet - low sodium heart healthy      Increase activity slowly          Medication List     As of 01/11/2012 10:27 AM    STOP taking these medications         insulin lispro protamine-insulin lispro (50-50) 100 UNIT/ML Susp   Commonly known as: HUMALOG 50/50      Tamsulosin HCl 0.4 MG Caps   Commonly known as: FLOMAX      TAKE these medications         aspirin 81 MG EC tablet   Take 1 tablet (81 mg total) by mouth daily.      B-complex with vitamin C tablet   Take 1 tablet by mouth daily.      carvedilol 25 MG tablet   Commonly known as: COREG   Take 1 tablet (25 mg total) by mouth 2 (two) times daily.      ezetimibe 10 MG tablet   Commonly known as: ZETIA   Take 10 mg by mouth daily.      Fish Oil 1000 MG Caps   Take 1,000 mg by mouth daily.      furosemide 40 MG tablet   Commonly known as: LASIX   Take 0.5 tablets (20 mg total) by mouth daily. Take 1 tablet Monday Wednesday Friday and Saturday.      insulin NPH-insulin regular (70-30) 100  UNIT/ML injection   Commonly known as: NOVOLIN 70/30   Inject 17 Units into the skin 2 (two) times daily with a meal.      NEEDLE (DISP) 18 G 18G X 1-1/2" Misc   1 Container by Does not apply route 2 (two) times daily with a meal.      omeprazole 40 MG capsule   Commonly known as: PRILOSEC   Take 40 mg by mouth daily as needed. Acid indigestion      VITAMIN B 12 PO   Take by mouth. 1000 mcg daily  Follow-up Information    Follow up with Horatio Pel, MD. Schedule an appointment as soon as possible for a visit in 1 week.   Contact information:   [address] [address] [phone]       Follow up with Darden Amber., MD. Schedule an appointment as soon as possible for a visit in 2 weeks.   Contact information:   [address] [place] [address] [phone]           The results of significant diagnostics from this hospitalization (including imaging, microbiology, ancillary and laboratory) are listed below for reference.    Significant Diagnostic Studies: Ct Head Wo Contrast  01/09/2012  *RADIOLOGY REPORT*  Clinical Data: Syncope.  CT HEAD WITHOUT CONTRAST  Technique:  Contiguous axial images were obtained from the base of the skull through the vertex without contrast.  Comparison: None.  Findings: There is marked atrophy.  Chronic microvascular ischemic change is identified.  No evidence of acute abnormality including infarct, hemorrhage, mass lesion, mass effect, midline shift or abnormal extra-axial fluid collection.  No hydrocephalus or pneumocephalus. 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  IMPRESSION: No acute abnormality.   Original Report Authenticated By: Orlean Patten, M.D.     Labs: Basic Metabolic Panel:  Lab [id number] 01/10/12 0205 01/09/12 1346  NA 138 135 131*  K 4.4 4.1 4.9  CL 106 104 98  CO2 22 21 20   GLUCOSE 149* 285*  438*  BUN 51* 65* 69*  CREATININE 1.95* 2.27* 2.52*  CALCIUM 9.8 9.6 10.2  MG -- 2.0 --  PHOS -- -- --   Liver Function Tests:  Lab 01/09/12 1346  AST 26  ALT 31  ALKPHOS 109  BILITOT 0.4  PROT 7.7  ALBUMIN 3.4*   CBC:  Lab 01/10/12 0205 01/09/12 1346  WBC 3.7* 5.0  NEUTROABS -- 3.1  HGB 11.3* 12.8*  HCT 34.4* 39.0  MCV 83.7 84.4  PLT 159 160   Cardiac Enzymes:  Lab 01/10/12 0904 01/10/12 0205 01/09/12 2044  CKTOTAL -- -- --  CKMB -- -- --  CKMBINDEX -- -- --  TROPONINI <0.30 <0.30 <0.30   BNP: BNP (last 3 results)  Basename 06/09/11 1122 04/10/11 1034  PROBNP 92.0 425.0*   CBG:  Lab 01/11/12 0732 01/10/12 2218 01/10/12 2143 01/10/12 1702 01/10/12 1204  GLUCAP 128* 97 55* 131* 233*       Signed:  Karen Kitchens [id number]  [facility] 01/11/2012, 10:27 AM   Attending -patient was seen and examined, agree with assessment and plan. Had d/w patient's primary cardiologist-Dr Joesph Fillers suggested to continue cautiously with Lasix, to stop Flomax and placed on TED hose. Patient was educated about slowly getting up from lying down and other orthostatic prevention measures.   S Breianna Delfino

## 2012-01-11 NOTE — Progress Notes (Signed)
NURSING PROGRESS NOTE  Colin [name] [id number] Discharge Data: 01/11/2012 11:11 AM Attending Provider: Jonetta Osgood, MD [id number] [name], MD     [name] to be D/C'd Home per MD order.  Discussed with the patient the After Visit Summary and all questions fully answered. All IV's discontinued with no bleeding noted. All belongings returned to patient for patient to take home.   Last Vital Signs:  Blood pressure 111/68, pulse 67, temperature 97.9 F (36.6 C), temperature source Oral, resp. rate 14, height 5\' 11"  (1.803 m), weight 85.2 kg (187 lb 13.3 oz), SpO2 98.00%.  Discharge Medication List   Medication List     As of 01/11/2012 11:11 AM    STOP taking these medications         insulin lispro protamine-insulin lispro (50-50) 100 UNIT/ML Susp   Commonly known as: HUMALOG 50/50      Tamsulosin HCl 0.4 MG Caps   Commonly known as: FLOMAX      TAKE these medications         aspirin 81 MG EC tablet   Take 1 tablet (81 mg total) by mouth daily.      B-complex with vitamin C tablet   Take 1 tablet by mouth daily.      carvedilol 25 MG tablet   Commonly known as: COREG   Take 1 tablet (25 mg total) by mouth 2 (two) times daily.      ezetimibe 10 MG tablet   Commonly known as: ZETIA   Take 10 mg by mouth daily.      Fish Oil 1000 MG Caps   Take 1,000 mg by mouth daily.      furosemide 40 MG tablet   Commonly known as: LASIX   Take 0.5 tablets (20 mg total) by mouth daily. Take 1 tablet Monday Wednesday Friday and Saturday.      insulin NPH-insulin regular (70-30) 100 UNIT/ML injection   Commonly known as: NOVOLIN 70/30   Inject 17 Units into the skin 2 (two) times daily with a meal.      NEEDLE (DISP) 18 G 18G X 1-1/2" Misc   1 Container by Does not apply route 2 (two) times daily with a meal.      omeprazole 40 MG capsule   Commonly known as: PRILOSEC   Take 40 mg by mouth daily as needed. Acid indigestion      VITAMIN B 12 PO   Take by  mouth. 1000 mcg daily        Deatra Ina, RN

## 2012-01-12 MED FILL — Insulin Aspart Prot & Aspart (Human) Inj 100 Unit/ML (70-30): SUBCUTANEOUS | Qty: 10 | Status: AC

## 2012-01-23 ENCOUNTER — Telehealth: Payer: Self-pay | Admitting: Cardiovascular Disease

## 2012-01-23 NOTE — Telephone Encounter (Signed)
New Problem:    Called in wanting to review the patient's list of medications.  Please call back.

## 2012-01-23 NOTE — Telephone Encounter (Signed)
Spoke with tammy, medication list from discharge confirmed.

## 2012-02-27 ENCOUNTER — Encounter (HOSPITAL_COMMUNITY): Payer: Self-pay

## 2012-02-27 ENCOUNTER — Emergency Department (HOSPITAL_COMMUNITY): Payer: Medicare Other

## 2012-02-27 ENCOUNTER — Inpatient Hospital Stay (HOSPITAL_COMMUNITY)
Admission: EM | Admit: 2012-02-27 | Discharge: 2012-03-01 | DRG: 638 | Disposition: A | Discharge status: Home Health | Payer: Medicare Other | Attending: Internal Medicine | Admitting: Internal Medicine

## 2012-02-27 DIAGNOSIS — R739 Hyperglycemia, unspecified: Secondary | ICD-10-CM

## 2012-02-27 DIAGNOSIS — N19 Unspecified kidney failure: Secondary | ICD-10-CM

## 2012-02-27 DIAGNOSIS — M79609 Pain in unspecified limb: Secondary | ICD-10-CM

## 2012-02-27 DIAGNOSIS — I739 Peripheral vascular disease, unspecified: Secondary | ICD-10-CM

## 2012-02-27 DIAGNOSIS — N183 Chronic kidney disease, stage 3 unspecified: Secondary | ICD-10-CM | POA: Diagnosis present

## 2012-02-27 DIAGNOSIS — I5022 Chronic systolic (congestive) heart failure: Secondary | ICD-10-CM | POA: Diagnosis present

## 2012-02-27 DIAGNOSIS — Z8673 Personal history of transient ischemic attack (TIA), and cerebral infarction without residual deficits: Secondary | ICD-10-CM

## 2012-02-27 DIAGNOSIS — Z9119 Patient's noncompliance with other medical treatment and regimen: Secondary | ICD-10-CM

## 2012-02-27 DIAGNOSIS — E871 Hypo-osmolality and hyponatremia: Secondary | ICD-10-CM | POA: Diagnosis present

## 2012-02-27 DIAGNOSIS — E1101 Type 2 diabetes mellitus with hyperosmolarity with coma: Principal | ICD-10-CM | POA: Diagnosis present

## 2012-02-27 DIAGNOSIS — R634 Abnormal weight loss: Secondary | ICD-10-CM | POA: Diagnosis present

## 2012-02-27 DIAGNOSIS — Z951 Presence of aortocoronary bypass graft: Secondary | ICD-10-CM

## 2012-02-27 DIAGNOSIS — E785 Hyperlipidemia, unspecified: Secondary | ICD-10-CM

## 2012-02-27 DIAGNOSIS — E1151 Type 2 diabetes mellitus with diabetic peripheral angiopathy without gangrene: Secondary | ICD-10-CM

## 2012-02-27 DIAGNOSIS — I2510 Atherosclerotic heart disease of native coronary artery without angina pectoris: Secondary | ICD-10-CM | POA: Diagnosis present

## 2012-02-27 DIAGNOSIS — E1110 Type 2 diabetes mellitus with ketoacidosis without coma: Secondary | ICD-10-CM

## 2012-02-27 DIAGNOSIS — R7309 Other abnormal glucose: Secondary | ICD-10-CM

## 2012-02-27 DIAGNOSIS — E875 Hyperkalemia: Secondary | ICD-10-CM

## 2012-02-27 DIAGNOSIS — R55 Syncope and collapse: Secondary | ICD-10-CM

## 2012-02-27 DIAGNOSIS — Z91199 Patient's noncompliance with other medical treatment and regimen due to unspecified reason: Secondary | ICD-10-CM

## 2012-02-27 DIAGNOSIS — R3129 Other microscopic hematuria: Secondary | ICD-10-CM

## 2012-02-27 DIAGNOSIS — Z950 Presence of cardiac pacemaker: Secondary | ICD-10-CM

## 2012-02-27 DIAGNOSIS — R627 Adult failure to thrive: Secondary | ICD-10-CM | POA: Diagnosis present

## 2012-02-27 DIAGNOSIS — I509 Heart failure, unspecified: Secondary | ICD-10-CM | POA: Diagnosis present

## 2012-02-27 DIAGNOSIS — E1100 Type 2 diabetes mellitus with hyperosmolarity without nonketotic hyperglycemic-hyperosmolar coma (NKHHC): Secondary | ICD-10-CM | POA: Diagnosis present

## 2012-02-27 DIAGNOSIS — Z862 Personal history of diseases of the blood and blood-forming organs and certain disorders involving the immune mechanism: Secondary | ICD-10-CM

## 2012-02-27 DIAGNOSIS — J449 Chronic obstructive pulmonary disease, unspecified: Secondary | ICD-10-CM

## 2012-02-27 DIAGNOSIS — N179 Acute kidney failure, unspecified: Secondary | ICD-10-CM | POA: Diagnosis present

## 2012-02-27 DIAGNOSIS — I501 Left ventricular failure: Secondary | ICD-10-CM

## 2012-02-27 LAB — BLOOD GAS, ARTERIAL
Drawn by: 11249
FIO2: 0.21 %
pCO2 arterial: 37.6 mmHg (ref 35.0–45.0)
pH, Arterial: 7.323 — ABNORMAL LOW (ref 7.350–7.450)

## 2012-02-27 LAB — URINALYSIS, ROUTINE W REFLEX MICROSCOPIC
Leukocytes, UA: NEGATIVE
Nitrite: NEGATIVE
Specific Gravity, Urine: 1.025 (ref 1.005–1.030)
pH: 5 (ref 5.0–8.0)

## 2012-02-27 LAB — COMPREHENSIVE METABOLIC PANEL
ALT: 33 U/L (ref 0–53)
Albumin: 3.4 g/dL — ABNORMAL LOW (ref 3.5–5.2)
Alkaline Phosphatase: 104 U/L (ref 39–117)
Glucose, Bld: 616 mg/dL (ref 70–99)
Potassium: 5.6 mEq/L — ABNORMAL HIGH (ref 3.5–5.1)
Sodium: 123 mEq/L — ABNORMAL LOW (ref 135–145)
Total Protein: 7.8 g/dL (ref 6.0–8.3)

## 2012-02-27 LAB — CBC WITH DIFFERENTIAL/PLATELET
Basophils Relative: 0 % (ref 0–1)
Eosinophils Absolute: 0.1 10*3/uL (ref 0.0–0.7)
Lymphs Abs: 1.4 10*3/uL (ref 0.7–4.0)
MCH: 27.9 pg (ref 26.0–34.0)
Neutro Abs: 3.8 10*3/uL (ref 1.7–7.7)
Neutrophils Relative %: 67 % (ref 43–77)
Platelets: 169 10*3/uL (ref 150–400)
RBC: 4.91 MIL/uL (ref 4.22–5.81)
WBC: 5.7 10*3/uL (ref 4.0–10.5)

## 2012-02-27 LAB — KETONES, QUALITATIVE: Acetone, Bld: NEGATIVE

## 2012-02-27 LAB — PROTIME-INR: INR: 0.94 (ref 0.00–1.49)

## 2012-02-27 MED ORDER — HEPARIN SODIUM (PORCINE) 5000 UNIT/ML IJ SOLN
5000.0000 [IU] | Freq: Three times a day (TID) | INTRAMUSCULAR | Status: DC
  Administered 2012-02-27 – 2012-03-01 (×7): 5000 [IU] via SUBCUTANEOUS
  Filled 2012-02-27 (×11): qty 1

## 2012-02-27 MED ORDER — SODIUM POLYSTYRENE SULFONATE 15 GM/60ML PO SUSP
15.0000 g | Freq: Once | ORAL | Status: AC
  Administered 2012-02-27: 15 g via ORAL
  Filled 2012-02-27: qty 60

## 2012-02-27 MED ORDER — DOCUSATE SODIUM 100 MG PO CAPS
100.0000 mg | ORAL_CAPSULE | Freq: Two times a day (BID) | ORAL | Status: DC
  Administered 2012-02-27 – 2012-03-01 (×6): 100 mg via ORAL
  Filled 2012-02-27 (×7): qty 1

## 2012-02-27 MED ORDER — INSULIN ASPART 100 UNIT/ML ~~LOC~~ SOLN
5.0000 [IU] | Freq: Once | SUBCUTANEOUS | Status: AC
  Administered 2012-02-27: 5 [IU] via INTRAVENOUS
  Filled 2012-02-27: qty 1

## 2012-02-27 MED ORDER — SODIUM CHLORIDE 0.9 % IV SOLN
INTRAVENOUS | Status: DC
  Administered 2012-02-27: via INTRAVENOUS

## 2012-02-27 MED ORDER — INSULIN ASPART 100 UNIT/ML IV SOLN: 5.0000 [IU] | Freq: Once | INTRAVENOUS | Status: DC

## 2012-02-27 MED ORDER — ALBUTEROL SULFATE (5 MG/ML) 0.5% IN NEBU: 2.5000 mg | INHALATION_SOLUTION | RESPIRATORY_TRACT | Status: DC

## 2012-02-27 MED ORDER — ASPIRIN EC 81 MG PO TBEC
81.0000 mg | DELAYED_RELEASE_TABLET | Freq: Every day | ORAL | Status: DC
  Administered 2012-02-28 – 2012-03-01 (×3): 81 mg via ORAL
  Filled 2012-02-27 (×3): qty 1

## 2012-02-27 MED ORDER — OMEGA-3-ACID ETHYL ESTERS 1 G PO CAPS
1.0000 g | ORAL_CAPSULE | Freq: Every day | ORAL | Status: DC
  Administered 2012-02-28 – 2012-03-01 (×3): 1 g via ORAL
  Filled 2012-02-27 (×3): qty 1

## 2012-02-27 MED ORDER — CARVEDILOL 12.5 MG PO TABS
12.5000 mg | ORAL_TABLET | Freq: Every day | ORAL | Status: DC
  Administered 2012-02-28 – 2012-03-01 (×3): 12.5 mg via ORAL
  Filled 2012-02-27 (×3): qty 1

## 2012-02-27 MED ORDER — EZETIMIBE 10 MG PO TABS
10.0000 mg | ORAL_TABLET | Freq: Every day | ORAL | Status: DC
  Administered 2012-02-28 – 2012-03-01 (×3): 10 mg via ORAL
  Filled 2012-02-27 (×3): qty 1

## 2012-02-27 MED ORDER — INSULIN ASPART 100 UNIT/ML ~~LOC~~ SOLN
0.0000 [IU] | Freq: Every day | SUBCUTANEOUS | Status: DC
  Administered 2012-02-27: 5 [IU] via SUBCUTANEOUS

## 2012-02-27 MED ORDER — INSULIN ASPART 100 UNIT/ML ~~LOC~~ SOLN
0.0000 [IU] | Freq: Three times a day (TID) | SUBCUTANEOUS | Status: DC
  Administered 2012-02-28 (×2): 3 [IU] via SUBCUTANEOUS
  Administered 2012-02-28: 7 [IU] via SUBCUTANEOUS
  Administered 2012-02-29: 1 [IU] via SUBCUTANEOUS
  Administered 2012-02-29: 3 [IU] via SUBCUTANEOUS
  Administered 2012-02-29: 7 [IU] via SUBCUTANEOUS
  Administered 2012-03-01: 9 [IU] via SUBCUTANEOUS
  Administered 2012-03-01: 3 [IU] via SUBCUTANEOUS

## 2012-02-27 MED ORDER — PANTOPRAZOLE SODIUM 40 MG PO TBEC
40.0000 mg | DELAYED_RELEASE_TABLET | Freq: Every day | ORAL | Status: DC
  Administered 2012-02-28 – 2012-03-01 (×3): 40 mg via ORAL
  Filled 2012-02-27 (×3): qty 1

## 2012-02-27 MED ORDER — SODIUM CHLORIDE 0.9 % IV BOLUS (SEPSIS)
500.0000 mL | Freq: Once | INTRAVENOUS | Status: AC
  Administered 2012-02-27: 500 mL via INTRAVENOUS

## 2012-02-27 MED ORDER — METHYLPREDNISOLONE SODIUM SUCC 125 MG IJ SOLR: 125.0000 mg | Freq: Once | INTRAMUSCULAR | Status: DC

## 2012-02-27 MED ORDER — SODIUM CHLORIDE 0.9 % IV SOLN
INTRAVENOUS | Status: DC
  Administered 2012-02-27: 18:00:00 via INTRAVENOUS
  Administered 2012-02-28: 75 mL/h via INTRAVENOUS

## 2012-02-27 MED ORDER — IPRATROPIUM BROMIDE 0.02 % IN SOLN: 0.5000 mg | RESPIRATORY_TRACT | Status: DC

## 2012-02-27 MED ORDER — INSULIN ASPART PROT & ASPART (70-30 MIX) 100 UNIT/ML ~~LOC~~ SUSP
17.0000 [IU] | Freq: Two times a day (BID) | SUBCUTANEOUS | Status: DC
  Administered 2012-02-28: 17 [IU] via SUBCUTANEOUS
  Filled 2012-02-27: qty 10

## 2012-02-27 MED ORDER — SODIUM CHLORIDE 0.9 % IJ SOLN
3.0000 mL | Freq: Two times a day (BID) | INTRAMUSCULAR | Status: DC
  Administered 2012-02-28 – 2012-03-01 (×5): 3 mL via INTRAVENOUS

## 2012-02-27 NOTE — ED Provider Notes (Signed)
History     CSN: [id number]  Arrival date & time 02/27/12  1449   First MD Initiated Contact with Patient 02/27/12 1538      Chief Complaint  Patient presents with  . Dizziness  . Fall    (Consider location/radiation/quality/duration/timing/severity/associated sxs/prior treatment) HPI Comments: Patient comes to the ER at the request of family. Patient and family report that he has been very weak for the last 2 weeks or so. Patient has been having trouble walking, fell 3 days ago. He hit his head on the sink. Has been having dizziness and headaches since the fall. Patient also reports shortness of breath. Shortness of breath his continuous, no alleviating or exacerbating factors. He does have a cough, nonproductive. He hasn't taken his temperature but has had some chills and sweats. He denies chest pain. No nausea, vomiting, but he has been experiencing diarrhea. He reports that he has diarrhea as soon as he eats.  Patient is a 79 y.o. male presenting with fall.  Fall Associated symptoms include headaches.    Past Medical History  Diagnosis Date  . Coronary artery disease     Status post CABG in 2004  . Peripheral vascular disease     Status post right femoropopliteal bypass  . Dyslipidemia   . Diabetes mellitus with peripheral artery disease   . Hypertension   . Chronic renal insufficiency     creatinine around 2.4  . CHF (congestive heart failure) 2007    ef 38%  . History of CVA (cerebrovascular accident)   . Cardiomyopathy   . COPD (chronic obstructive pulmonary disease)   . History of anemia     Past Surgical History  Procedure Date  . Insert / replace / remove pacemaker 05/17/2005    st. jude dual chamber ddd mode   . Coronary artery bypass graft 4 of 2004    4 vessel bypass  . Femoral bypass july 2006    right with right great toe amputation  . Cardiac catheterization 04/29/2002    Dr. Glade Lloyd  . Esophagogastroduodenoscopy 07/21/2011    Procedure:  ESOPHAGOGASTRODUODENOSCOPY (EGD);  Surgeon: Beryle Beams, MD;  Location: [facility];  Service: Endoscopy;  Laterality: N/A;    Family History  Problem Relation Age of Onset  . Hyperlipidemia Mother   . Heart disease Father     History  Substance Use Topics  . Smoking status: Former Smoker    Quit date: 06/09/2002  . Smokeless tobacco: Never Used  . Alcohol Use: No      Review of Systems  Constitutional: Positive for chills and fatigue.  Respiratory: Positive for cough and shortness of breath.   Cardiovascular: Negative for chest pain.  Gastrointestinal: Positive for diarrhea.  Musculoskeletal: Positive for gait problem.  Neurological: Positive for weakness and headaches.  All other systems reviewed and are negative.    Allergies  Lisinopril  Home Medications   Current Outpatient Rx  Name  Route  Sig  Dispense  Refill  . ASPIRIN 81 MG PO TBEC   Oral   Take 1 tablet (81 mg total) by mouth daily.         . B COMPLEX-C PO TABS   Oral   Take 1 tablet by mouth daily.         Marland Kitchen CARVEDILOL 25 MG PO TABS   Oral   Take 12.5 mg by mouth daily.          Marland Kitchen VITAMIN B 12 PO   Oral  Take by mouth. 1000 mcg daily          . EZETIMIBE 10 MG PO TABS   Oral   Take 10 mg by mouth daily.           . FUROSEMIDE 40 MG PO TABS   Oral   Take 0.5 tablets (20 mg total) by mouth daily. Take 1 tablet Monday Wednesday Friday and Saturday.   30 tablet   0   . INSULIN ISOPHANE & REGULAR (70-30) 100 UNIT/ML [place] SUSP   Subcutaneous   Inject 17 Units into the skin 2 (two) times daily with a meal.         . NEEDLE (DISP) 18G X 1-1/2" MISC   Does not apply   1 Container by Does not apply route 2 (two) times daily with a meal.   100 each   3   . FISH OIL 1000 MG PO CAPS   Oral   Take 1,000 mg by mouth daily.          Marland Kitchen OMEPRAZOLE 40 MG PO CPDR   Oral   Take 40 mg by mouth daily as needed. Acid indigestion           BP 143/56  Temp 97.9 F (36.6 C)  (Oral)  Resp 18  SpO2 100%  Physical Exam  Constitutional: He is oriented to person, place, and time. He appears well-developed and well-nourished. No distress.  HENT:  Head: Normocephalic and atraumatic.  Right Ear: Hearing normal.  Nose: Nose normal.  Mouth/Throat: Oropharynx is clear and moist and mucous membranes are normal.  Eyes: Conjunctivae normal and EOM are normal. Pupils are equal, round, and reactive to light.  Neck: Normal range of motion. Neck supple.  Cardiovascular: Normal rate, regular rhythm, S1 normal and S2 normal.  Exam reveals no gallop and no friction rub.   No murmur heard. Pulmonary/Chest: Effort normal and breath sounds normal. No respiratory distress. He exhibits no tenderness.  Abdominal: Soft. Normal appearance and bowel sounds are normal. There is no hepatosplenomegaly. There is no tenderness. There is no rebound, no guarding, no tenderness at McBurney's point and negative Murphy's sign. No hernia.  Musculoskeletal: Normal range of motion.  Neurological: He is alert and oriented to person, place, and time. He has normal strength. No cranial nerve deficit or sensory deficit. Coordination normal. GCS eye subscore is 4. GCS verbal subscore is 5. GCS motor subscore is 6.  Skin: Skin is warm, dry and intact. No rash noted. No cyanosis.  Psychiatric: He has a normal mood and affect. His speech is normal and behavior is normal. Thought content normal.    ED Course  Procedures (including critical care time)  EKG: rate 60, A-V pacing  Labs Reviewed  COMPREHENSIVE METABOLIC PANEL - Abnormal; Notable for the following:    Sodium 123 (*)     Potassium 5.6 (*)     Chloride 89 (*)     CO2 18 (*)     Glucose, Bld 616 (*)     BUN 82 (*)     Creatinine, Ser 3.02 (*)     Albumin 3.4 (*)     GFR calc non Af Amer 18 (*)     GFR calc Af Amer 21 (*)     All other components within normal limits  PRO B NATRIURETIC PEPTIDE - Abnormal; Notable for the following:    Pro  B Natriuretic peptide (BNP) 488.1 (*)     All other components within normal  limits  URINALYSIS, ROUTINE W REFLEX MICROSCOPIC - Abnormal; Notable for the following:    Glucose, UA >1000 (*)     All other components within normal limits  BLOOD GAS, ARTERIAL - Abnormal; Notable for the following:    pH, Arterial 7.323 (*)     pO2, Arterial 72.7 (*)     Bicarbonate 18.9 (*)     Acid-base deficit 6.1 (*)     All other components within normal limits  URINE MICROSCOPIC-ADD ON - Abnormal; Notable for the following:    Casts GRANULAR CAST (*)     All other components within normal limits  GLUCOSE, CAPILLARY - Abnormal; Notable for the following:    Glucose-Capillary 414 (*)     All other components within normal limits  CBC WITH DIFFERENTIAL  TROPONIN I  PROTIME-INR  KETONES, QUALITATIVE   Dg Chest 2 View  02/27/2012  *RADIOLOGY REPORT*  Clinical Data: Short of breath  CHEST - 2 VIEW  Comparison: 10/26/2011  Findings: Right hemidiaphragm remains elevated.  Dual lead right subclavian pacemaker device and leads are stable.  Postoperative changes from coronary artery bypass graft.  Small right pleural effusion has developed.  Low lung volumes.  No pneumothorax.  IMPRESSION: New small right pleural effusion.  Otherwise stable exam.   Original Report Authenticated By: Marybelle Killings, M.D.    Ct Head Wo Contrast  02/27/2012  *RADIOLOGY REPORT*  Clinical Data: Dizziness, weakness, frequent falls in past 2 weeks, struck head during one of falls, history diabetes, hypertension, coronary artery disease, dyslipidemia, COPD, stroke, and chronic renal insufficiency  CT HEAD WITHOUT CONTRAST  Technique:  Contiguous axial images were obtained from the base of the skull through the vertex without contrast.  Comparison: 01/09/2012  Findings: Generalized atrophy. Normal ventricular morphology. No midline shift or mass effect. Small vessel chronic ischemic changes of deep cerebral white matter. No intracranial  hemorrhage, mass lesion, or acute infarction. Questionable tiny old lacunar infarct at right external capsule. No extra-axial fluid collections. Visualized paranasal sinuses and mastoid air cells clear. Atherosclerotic calcifications of the internal carotid arteries bilaterally at the skull base. Again identified mixed lytic and sclerotic process involving the left sphenoid, unchanged, question fibrous dysplasia.  IMPRESSION: Atrophy with small vessel chronic ischemic changes of deep cerebral white matter. Suspect tiny old lacunar infarct at right external capsule. No acute intracranial abnormalities.   Original Report Authenticated By: Lavonia Dana, M.D.      Diagnoses: 1. Hyperglycemia with mild DKA 2. Acute on chronic renal insufficiency 3. Hyperkalemia    MDM  Patient brought to the ER by family because of generalized weakness with falls. He has had progressive decline over a period of 2 weeks and is extremely weak, not eating or drinking at home. Workup reveals significant hyperglycemia with mild acidosis. Patient has a history of renal insufficiency, but his creatinine and BUN are both significantly elevated over her normal baseline. This is most consistent with the stated history of decreased oral intake and dehydration. Patient was gently hydrated with a 500 mL fluid bolus because of the stated history of congestive heart failure. He was given 5 mg of insulin IV as well. Patient's EKG in shows potentially slightly peaked T waves, no other changes associated with the hyperkalemia. Was administered Kayexalate. Patient will require hospitalization for further management.        Orpah Greek, MD 02/27/12 2128

## 2012-02-27 NOTE — ED Notes (Signed)
Patient's family reports that the patient has been having dizziness, weakness, and falling frequently in the past 2 weeks. Patient did say he hit his head during one of his falls.

## 2012-02-27 NOTE — ED Notes (Signed)
CRITICAL VALUE ALERT  Critical value received: Glucose 616 Date of notification:  02/27/2012 Time of notification:  [id number] Critical value read back: yes Nurse who received alert:  Abby, RN MD notified:  Pollina Time of notification: [phone]

## 2012-02-27 NOTE — ED Notes (Signed)
Pt is aware that a urine sample is needed, will try again.

## 2012-02-27 NOTE — H&P (Signed)
[facility] History and Physical  Colin [name] [id number] DOB: Dec 16, 1928    PCP:   Horatio Pel, MD   Chief Complaint: weakness.  HPI: Colin [name] is an 78 y.o. male with hx of recurrent syncope, s/p ppm, HTN, DM, CHF (35%EF), hyperlipidemia, brought in by family as he has been feeling weak, had a mechanical fall a few days ago, and decreased PO intake.  Evaluation in the ER showed hyperglycemia with BS 600, HCO3 18, Na 123, K of 5.6 and BUN/Cr of 82/3.02.  His CXR showed slight right sided pleural effusion.  His head CT showed chronic changes, none acute.  He was given IVF of 500cc, and some IV insulin, and hospitalist was asked to admit him for hyperglycemia and dehydration.   He thought sometimes he is depressed.  He is not suicidal. He has not taken his medications at all, sometimes for a month at a time.  Denied any SOB, CP, HA, abdominal pain.   He doesn't think he had a colonoscopy in a while.  No significant weight loss.  His family thought he may have been a little forgetful.  He has loss his appetite of late.  Rewiew of Systems:  Constitutional: Negative for malaise, fever and chills. No significant weight loss or weight gain Eyes: Negative for eye pain, redness and discharge, diplopia, visual changes, or flashes of light. ENMT: Negative for ear pain, hoarseness, nasal congestion, sinus pressure and sore throat. No headaches; tinnitus, drooling, or problem swallowing. Cardiovascular: Negative for chest pain, palpitations, diaphoresis, dyspnea and peripheral edema. ; No orthopnea, PND Respiratory: Negative for cough, hemoptysis, wheezing and stridor. No pleuritic chestpain. Gastrointestinal: Negative for nausea, vomiting, diarrhea, constipation, abdominal pain, melena, blood in stool, hematemesis, jaundice and rectal bleeding.    Genitourinary: Negative for frequency, dysuria, incontinence,flank pain and hematuria; Musculoskeletal: Negative for back pain and  neck pain. Negative for swelling and trauma.;  Skin: . Negative for pruritus, rash, abrasions, bruising and skin lesion.; ulcerations Neuro: Negative for headache, lightheadedness and neck stiffness. Negative for altered level of consciousness , altered mental status, extremity weakness, burning feet, involuntary movement, seizure and syncope.  Psych: negative for anxiety,  insomnia, tearfulness, panic attacks, hallucinations, paranoia, suicidal or homicidal ideation    Past Medical History  Diagnosis Date  . Coronary artery disease     Status post CABG in 2004  . Peripheral vascular disease     Status post right femoropopliteal bypass  . Dyslipidemia   . Diabetes mellitus with peripheral artery disease   . Hypertension   . Chronic renal insufficiency     creatinine around 2.4  . CHF (congestive heart failure) 2007    ef 38%  . History of CVA (cerebrovascular accident)   . Cardiomyopathy   . COPD (chronic obstructive pulmonary disease)   . History of anemia     Past Surgical History  Procedure Date  . Insert / replace / remove pacemaker 05/17/2005    st. jude dual chamber ddd mode   . Coronary artery bypass graft 4 of 2004    4 vessel bypass  . Femoral bypass july 2006    right with right great toe amputation  . Cardiac catheterization 04/29/2002    Dr. Glade Lloyd  . Esophagogastroduodenoscopy 07/21/2011    Procedure: ESOPHAGOGASTRODUODENOSCOPY (EGD);  Surgeon: Beryle Beams, MD;  Location: [facility];  Service: Endoscopy;  Laterality: N/A;    Medications:  HOME MEDS: Prior to Admission medications   Medication Sig Start Date End Date  Taking? Authorizing Provider  aspirin EC 81 MG EC tablet Take 1 tablet (81 mg total) by mouth daily. 01/11/12  Yes Marianne L York, PA  B Complex-C (B-COMPLEX WITH VITAMIN C) tablet Take 1 tablet by mouth daily.   Yes Historical Provider, MD  carvedilol (COREG) 25 MG tablet Take 12.5 mg by mouth daily.  12/07/11  Yes Burtis Junes, NP   Cyanocobalamin (VITAMIN B 12 PO) Take by mouth. 1000 mcg daily    Yes Historical Provider, MD  ezetimibe (ZETIA) 10 MG tablet Take 10 mg by mouth daily.     Yes Historical Provider, MD  furosemide (LASIX) 40 MG tablet Take 0.5 tablets (20 mg total) by mouth daily. Take 1 tablet Monday Wednesday Friday and Saturday. 01/11/12  Yes [name] York, PA  insulin NPH-insulin regular (NOVOLIN 70/30) (70-30) 100 UNIT/ML injection Inject 17 Units into the skin 2 (two) times daily with a meal. 01/11/12  Yes Shanker Kristeen Mans, MD  NEEDLE, DISP, 18 G 18G X 1-1/2" MISC 1 Container by Does not apply route 2 (two) times daily with a meal. 01/11/12  Yes [name] York, PA  Omega-3 Fatty Acids (FISH OIL) 1000 MG CAPS Take 1,000 mg by mouth daily.    Yes Historical Provider, MD  omeprazole (PRILOSEC) 40 MG capsule Take 40 mg by mouth daily as needed. Acid indigestion   Yes Historical Provider, MD     Allergies:  Allergies  Allergen Reactions  . Lisinopril     Elevated potassium,renal insuf    Social History:   reports that he quit smoking about 9 years ago. He has never used smokeless tobacco. He reports that he does not drink alcohol or use illicit drugs.  Family History: Family History  Problem Relation Age of Onset  . Hyperlipidemia Mother   . Heart disease Father      Physical Exam: Filed Vitals:   02/27/12 1531 02/27/12 1926  BP: 143/56 144/53  Pulse:  75  Temp: 97.9 F (36.6 C) 98.3 F (36.8 C)  TempSrc: Oral Oral  Resp: 18 20  SpO2: 100% 100%   Blood pressure 144/53, pulse 75, temperature 98.3 F (36.8 C), temperature source Oral, resp. rate 20, SpO2 100.00%.  GEN:  Pleasant  patient lying in the stretcher in no acute distress; cooperative with exam. PSYCH:  alert and oriented x4; does not appear anxious or depressed; affect is appropriate. HEENT: Mucous membranes pink and anicteric; PERRLA; EOM intact; no cervical lymphadenopathy nor thyromegaly or carotid bruit; no JVD; There  were no stridor. Neck is very supple. Breasts:: Not examined CHEST WALL: No tenderness. Pacemaker on chest wall. CHEST: Normal respiration, clear to auscultation bilaterally.  HEART: Regular rate and rhythm.  There are no murmur, rub, or gallops.   BACK: No kyphosis or scoliosis; no CVA tenderness ABDOMEN: soft and non-tender; no masses, no organomegaly, normal abdominal bowel sounds; no pannus; no intertriginous candida. There is no rebound and no distention. Rectal Exam: Not done EXTREMITIES: No bone or joint deformity; age-appropriate arthropathy of the hands and knees; no edema; no ulcerations.  There is no calf tenderness. Genitalia: not examined PULSES: 2+ and symmetric SKIN: Normal hydration no rash or ulceration CNS: Cranial nerves 2-12 grossly intact no focal lateralizing neurologic deficit.  Speech is fluent; uvula elevated with phonation, facial symmetry and tongue midline. DTR are normal bilaterally, cerebella exam is intact, barbinski is negative and strengths are equaled bilaterally.  No sensory loss.   Labs on Admission:  Basic Metabolic Panel:  Lab 02/27/12 1725  NA 123*  K 5.6*  CL 89*  CO2 18*  GLUCOSE 616*  BUN 82*  CREATININE 3.02*  CALCIUM 9.9  MG --  PHOS --   Liver Function Tests:  Lab 02/27/12 1725  AST 30  ALT 33  ALKPHOS 104  BILITOT 0.4  PROT 7.8  ALBUMIN 3.4*   No results found for this basename: LIPASE:5,AMYLASE:5 in the last 168 hours No results found for this basename: AMMONIA:5 in the last 168 hours CBC:  Lab 02/27/12 1725  WBC 5.7  NEUTROABS 3.8  HGB 13.7  HCT 40.8  MCV 83.1  PLT 169   Cardiac Enzymes:  Lab 02/27/12 1725  CKTOTAL --  CKMB --  CKMBINDEX --  TROPONINI <0.30    CBG:  Lab 02/27/12 2051  GLUCAP 414*     Radiological Exams on Admission: Dg Chest 2 View  02/27/2012  *RADIOLOGY REPORT*  Clinical Data: Short of breath  CHEST - 2 VIEW  Comparison: 10/26/2011  Findings: Right hemidiaphragm remains elevated.   Dual lead right subclavian pacemaker device and leads are stable.  Postoperative changes from coronary artery bypass graft.  Small right pleural effusion has developed.  Low lung volumes.  No pneumothorax.  IMPRESSION: New small right pleural effusion.  Otherwise stable exam.   Original Report Authenticated By: Marybelle Killings, M.D.    Ct Head Wo Contrast  02/27/2012  *RADIOLOGY REPORT*  Clinical Data: Dizziness, weakness, frequent falls in past 2 weeks, struck head during one of falls, history diabetes, hypertension, coronary artery disease, dyslipidemia, COPD, stroke, and chronic renal insufficiency  CT HEAD WITHOUT CONTRAST  Technique:  Contiguous axial images were obtained from the base of the skull through the vertex without contrast.  Comparison: 01/09/2012  Findings: Generalized atrophy. Normal ventricular morphology. No midline shift or mass effect. Small vessel chronic ischemic changes of deep cerebral white matter. No intracranial hemorrhage, mass lesion, or acute infarction. Questionable tiny old lacunar infarct at right external capsule. No extra-axial fluid collections. Visualized paranasal sinuses and mastoid air cells clear. Atherosclerotic calcifications of the internal carotid arteries bilaterally at the skull base. Again identified mixed lytic and sclerotic process involving the left sphenoid, unchanged, question fibrous dysplasia.  IMPRESSION: Atrophy with small vessel chronic ischemic changes of deep cerebral white matter. Suspect tiny old lacunar infarct at right external capsule. No acute intracranial abnormalities.   Original Report Authenticated By: Lavonia Dana, M.D.     EKG: Independently reviewed. SR with no acute ST T changes.  PVC.   Assessment/Plan Present on Admission:  . Type 2 diabetes mellitus with hyperosmolar nonketotic hyperglycemia . Failure to thrive in adult . Cardiac pacemaker in situ . Chronic left-sided CHF (congestive heart failure) . Diabetes mellitus with  peripheral artery disease . Acute kidney injury  PLAN:  I will admit him for hyperosmolar hyperglycemia.  He is clearly volume depleted.  He will do well  IVF, and some SSI.  Will be careful and not put him into CHF.  I am not sure as to why he failed to thrive.  It could be stress and depression.  Will check a TSH and B12 level.  If he is not better, outpatient work up for occult malignancy may be indicated, but will leave that to his PCP.  His ARF is mostly pre-renal and it should improve.  His baseline Cr is about 2.0 as he has CKD as well.  I encouraged him to be compliant with his meds. He is stable, full code,  and will be admitted to [facility] service under telemetry.  Thank you for allowing me to take part in the care of your nice patient.  Other plans as per orders.  Code Status: FULL Haskel Khan, MD. [facility] Pager [phone] 7pm to 7am.  02/27/2012, 10:05 PM

## 2012-02-28 ENCOUNTER — Encounter (HOSPITAL_COMMUNITY): Payer: Self-pay | Admitting: General Practice

## 2012-02-28 DIAGNOSIS — E1159 Type 2 diabetes mellitus with other circulatory complications: Secondary | ICD-10-CM

## 2012-02-28 DIAGNOSIS — I739 Peripheral vascular disease, unspecified: Secondary | ICD-10-CM

## 2012-02-28 DIAGNOSIS — N179 Acute kidney failure, unspecified: Secondary | ICD-10-CM

## 2012-02-28 LAB — BASIC METABOLIC PANEL
BUN: 79 mg/dL — ABNORMAL HIGH (ref 6–23)
Calcium: 9.4 mg/dL (ref 8.4–10.5)
GFR calc non Af Amer: 20 mL/min — ABNORMAL LOW (ref >90)
Glucose, Bld: 306 mg/dL — ABNORMAL HIGH (ref 70–99)

## 2012-02-28 LAB — GLUCOSE, CAPILLARY
Glucose-Capillary: 278 mg/dL — ABNORMAL HIGH (ref 70–99)
Glucose-Capillary: 265 mg/dL — ABNORMAL HIGH (ref 70–99)
Glucose-Capillary: 152 mg/dL — ABNORMAL HIGH (ref 70–99)

## 2012-02-28 LAB — CBC
MCV: 81.2 fL (ref 78.0–100.0)
Platelets: 195 10*3/uL (ref 150–400)
RBC: 4.83 MIL/uL (ref 4.22–5.81)
WBC: 5.6 10*3/uL (ref 4.0–10.5)

## 2012-02-28 LAB — TSH: TSH: 2.387 u[IU]/mL (ref 0.350–4.500)

## 2012-02-28 LAB — CREATININE, SERUM
Creatinine, Ser: 2.85 mg/dL — ABNORMAL HIGH (ref 0.50–1.35)
GFR calc Af Amer: 22 mL/min — ABNORMAL LOW (ref >90)

## 2012-02-28 LAB — MAGNESIUM: Magnesium: 2.3 mg/dL (ref 1.5–2.5)

## 2012-02-28 MED ORDER — INSULIN ASPART PROT & ASPART (70-30 MIX) 100 UNIT/ML ~~LOC~~ SUSP
20.0000 [IU] | Freq: Two times a day (BID) | SUBCUTANEOUS | Status: DC
  Administered 2012-02-28 – 2012-02-29 (×3): 20 [IU] via SUBCUTANEOUS
  Filled 2012-02-28: qty 10

## 2012-02-28 NOTE — Progress Notes (Signed)
Inpatient Diabetes Program Recommendations  AACE/ADA: New Consensus Statement on Inpatient Glycemic Control (2013)  Target Ranges:  Prepandial:   less than 140 mg/dL      Peak postprandial:   less than 180 mg/dL (1-2 hours)      Critically ill patients:  140 - 180 mg/dL   Reason for Assessment:  Hyperglycemia  Results for Colin [name], Colin [name] (MRN [id number]) as of 02/28/2012 16:14  Ref. Range 02/27/2012 20:51 02/27/2012 22:23 02/27/2012 23:27 02/28/2012 07:30 02/28/2012 12:39  Glucose-Capillary Latest Range: 70-99 mg/dL 414 (H) 370 (H) 391 (H) 278 (H) 304 (H)    Inpatient Diabetes Program Recommendations Insulin - Basal: Increase 70/30 to 20 units bid  Note: Discussed with RN.  Thank you. Lorenda Peck, RD, LDN, CDE Inpatient Diabetes Coordinator [phone]

## 2012-02-28 NOTE — Progress Notes (Signed)
Pt is A-Paced on monitor at baseline. Pt had periods of Bigeminy and Trigeminy between 22:50 and 00:00 (2/4-2/5) with a 27 beat run of VTach at 23:51. Pt was asymptomatic. MD (Dr. Baltazar Najjar) paged. Kirby ordered BMP and Mag level at 0500 in am and a STAT EKG. EKG showed A-Paced rhythm with LV hypertrophy, widening QRS, and T wave abnormality. EKG report suggested "consider lateral ischemia." Paged Dr. Baltazar Najjar with results, awaiting her call. Will continue to monitor pt. - Carmon Ginsberg, RN

## 2012-02-28 NOTE — Progress Notes (Signed)
[facility] PROGRESS NOTE  Colin [name] [id number] DOB: April 07, 1928 DOA: 02/27/2012 PCP: Horatio Pel, MD  Assessment/Plan: 1. DM- hyperosmolar hyperglycemia-  IVF, SSI, insulin- has not been following a diabetic diet- HgbA1C 10 2. CKD- IVF, improving (baseline 2) 3. H/o CHF- 30-35% EF on 4/13 4. Non complaince- not taking meds, ? Depression vs dementia 5. PT/OT eval- lives at home with wife 6. ? FTT- outpatient malignancy work up 7. Hyponatremia- improved wit IVF and better control of BS  Code Status: full Family Communication: patient at bedside Disposition Plan: PT/OT   Consultants:  none  Procedures:    Antibiotics:    HPI/Subjective: Feeling better No CP, no SOB Eating ok    Objective: Filed Vitals:   02/27/12 2217 02/27/12 2245 02/27/12 2301 02/28/12 0605  BP: 151/60  145/48 131/48  Pulse: 70  69 78  Temp: 98.3 F (36.8 C)  98 F (36.7 C) 97.5 F (36.4 C)  TempSrc: Oral  Oral Oral  Resp: 17  16 16   Height:  5\' 11"  (1.803 m)    Weight:  79.697 kg (175 lb 11.2 oz)    SpO2: 97%  100% 99%   No intake or output data in the 24 hours ending 02/28/12 1141 Filed Weights   02/27/12 2245  Weight: 79.697 kg (175 lb 11.2 oz)    Exam:   General:  Pleasant/cooperative  Cardiovascular: rrr  Respiratory: clear anterior  Abdomen: +BS, soft, NT  Data Reviewed: Basic Metabolic Panel:  Lab [id number] 0430 02/27/12 2320 02/27/12 1725  NA 129* -- 123*  K 3.7 -- 5.6*  CL 96 -- 89*  CO2 22 -- 18*  GLUCOSE 306* -- 616*  BUN 79* -- 82*  CREATININE 2.71* 2.85* 3.02*  CALCIUM 9.4 -- 9.9  MG 2.3 -- --  PHOS -- -- --   Liver Function Tests:  Lab 02/27/12 1725  AST 30  ALT 33  ALKPHOS 104  BILITOT 0.4  PROT 7.8  ALBUMIN 3.4*   No results found for this basename: LIPASE:5,AMYLASE:5 in the last 168 hours No results found for this basename: AMMONIA:5 in the last 168 hours CBC:  Lab 02/27/12 2320 02/27/12 1725  WBC 5.6 5.7   NEUTROABS -- 3.8  HGB 13.5 13.7  HCT 39.2 40.8  MCV 81.2 83.1  PLT 195 169   Cardiac Enzymes:  Lab 02/27/12 1725  CKTOTAL --  CKMB --  CKMBINDEX --  TROPONINI <0.30   BNP (last 3 results)  Basename 02/27/12 1725 06/09/11 1122 04/10/11 1034  PROBNP 488.1* 92.0 425.0*   CBG:  Lab 02/28/12 0730 02/27/12 2327 02/27/12 2223 02/27/12 2051  GLUCAP 278* 391* 370* 414*    No results found for this or any previous visit (from the past 240 hour(s)).   Studies: Dg Chest 2 View  02/27/2012  *RADIOLOGY REPORT*  Clinical Data: Short of breath  CHEST - 2 VIEW  Comparison: 10/26/2011  Findings: Right hemidiaphragm remains elevated.  Dual lead right subclavian pacemaker device and leads are stable.  Postoperative changes from coronary artery bypass graft.  Small right pleural effusion has developed.  Low lung volumes.  No pneumothorax.  IMPRESSION: New small right pleural effusion.  Otherwise stable exam.   Original Report Authenticated By: Marybelle Killings, M.D.    Ct Head Wo Contrast  02/27/2012  *RADIOLOGY REPORT*  Clinical Data: Dizziness, weakness, frequent falls in past 2 weeks, struck head during one of falls, history diabetes, hypertension, coronary artery disease, dyslipidemia, COPD, stroke, and chronic renal insufficiency  CT HEAD WITHOUT CONTRAST  Technique:  Contiguous axial images were obtained from the base of the skull through the vertex without contrast.  Comparison: 01/09/2012  Findings: Generalized atrophy. Normal ventricular morphology. No midline shift or mass effect. Small vessel chronic ischemic changes of deep cerebral white matter. No intracranial hemorrhage, mass lesion, or acute infarction. Questionable tiny old lacunar infarct at right external capsule. No extra-axial fluid collections. Visualized paranasal sinuses and mastoid air cells clear. Atherosclerotic calcifications of the internal carotid arteries bilaterally at the skull base. Again identified mixed lytic and sclerotic  process involving the left sphenoid, unchanged, question fibrous dysplasia.  IMPRESSION: Atrophy with small vessel chronic ischemic changes of deep cerebral white matter. Suspect tiny old lacunar infarct at right external capsule. No acute intracranial abnormalities.   Original Report Authenticated By: Lavonia Dana, M.D.     Scheduled Meds:   . aspirin EC  81 mg Oral Daily  . carvedilol  12.5 mg Oral Daily  . docusate sodium  100 mg Oral BID  . ezetimibe  10 mg Oral Daily  . heparin  5,000 Units Subcutaneous Q8H  . insulin aspart  0-5 Units Subcutaneous QHS  . insulin aspart  0-9 Units Subcutaneous TID WC  . insulin aspart protamine-insulin aspart  17 Units Subcutaneous BID WC  . omega-3 acid ethyl esters  1 g Oral Daily  . pantoprazole  40 mg Oral Daily  . sodium chloride  3 mL Intravenous Q12H   Continuous Infusions:   . sodium chloride 75 mL/hr at 02/27/12 1803    Principal Problem:  *Type 2 diabetes mellitus with hyperosmolar nonketotic hyperglycemia Active Problems:  Cardiac pacemaker in situ  Chronic left-sided CHF (congestive heart failure)  Acute kidney injury  History of CVA (cerebrovascular accident)  Diabetes mellitus with peripheral artery disease  Failure to thrive in adult  Non-compliant patient    Time spent: [place], [place] [facility] Pager [phone]. If 8PM-8AM, please contact night-coverage at [email or web address], password [facility] 02/28/2012, 11:41 AM  LOS: 1 day

## 2012-02-28 NOTE — Clinical Documentation Improvement (Signed)
CHF DOCUMENTATION CLARIFICATION QUERY  THIS DOCUMENT IS NOT A PERMANENT PART OF THE MEDICAL RECORD  TO RESPOND TO THE THIS QUERY, FOLLOW THE INSTRUCTIONS BELOW:  1. If needed, update documentation for the patient's encounter via the notes activity.  2. Access this query again and click edit on the In [place].  3. After updating, or not, click F2 to complete all highlighted (required) fields concerning your review. Select "additional documentation in the medical record" OR "no additional documentation provided".  4. Click Sign note button.  5. The deficiency will fall out of your In Basket *Please let us know if you are not able to complete this workflow by phone or e-mail (listed below).  Please update your documentation within the medical record to reflect your response to this query.                                                                                    02/28/12  Dear Dr. Eulogio Bear / Associates,  In a better effort to capture your patient's severity of illness, reflect appropriate length of stay and utilization of resources, a review of the patient medical record has revealed the following indicators the diagnosis of Heart Failure.    Based on your clinical judgment, please clarify and document in a progress note and/or discharge summary the clinical condition associated with the following supporting information:  In responding to this query please exercise your independent judgment.  The fact that a query is asked, does not imply that any particular answer is desired or expected.  Possible Clinical Conditions?   Chronic Systolic Congestive Heart Failure Chronic Diastolic Congestive Heart Failure Chronic Systolic & Diastolic Congestive Heart Failure Acute Systolic Congestive Heart Failure Acute Diastolic Congestive Heart Failure Acute Systolic & Diastolic Congestive Heart Failure Acute on Chronic Systolic Congestive Heart Failure Acute on Chronic Diastolic  Congestive Heart Failure Acute on Chronic Systolic & Diastolic  Congestive Heart Failure Other Condition________________________________________ Cannot Clinically Determine  Supporting Information:  Risk Factors: Per 2/5 MD progress note:  Patient has a history of CHF- 30-35% EF on 4/13.      Signs & Symptoms:Per 2/5 MD progress note: patient has Chronic left-sided CHF (congestive heart failure)    Reviewed: updated in chart on d/c summary  Thank You,  [place], BSN, CCM   Clinical Documentation Specialist:  Pager [phone] or Phone  [phone] Health Information Management [place]

## 2012-02-28 NOTE — Evaluation (Signed)
Physical Therapy Evaluation Patient Details Name: Colin [name] MRN: [id number] DOB: Jun 05, 1928 Today's Date: 02/28/2012 Time: [id number] PT Time Calculation (min): 9 min  PT Assessment / Plan / Recommendation Clinical Impression  Pt admitted for weakness and DM- hyperosmolar hyperglycemia.  Pt would benefit from acute PT services in order to improve safety and independence with transfers and ambulation.  Pt reports he usually is modified independent with straight cane.  Recommend 24/7 supervision at home for safety as pt reports hx of falls.    PT Assessment  Patient needs continued PT services    Follow Up Recommendations  Supervision/Assistance - 24 hour;SNF;Home health PT (HHPT if assist available at home)    Does the patient have the potential to tolerate intense rehabilitation      Barriers to Discharge        Equipment Recommendations  Rolling walker with 5" wheels    Recommendations for Other Services     Frequency Min 3X/week    Precautions / Restrictions Precautions Precautions: Fall   Pertinent Vitals/Pain n/a      Mobility  Bed Mobility Bed Mobility: Supine to Sit;Sit to Supine Supine to Sit: 5: Supervision Sit to Supine: 5: Supervision Details for Bed Mobility Assistance: verbal cues for foley, blankets Transfers Transfers: Sit to Stand;Stand to Sit Sit to Stand: 4: Min guard;With upper extremity assist;From bed Stand to Sit: 4: Min guard;With upper extremity assist;To bed Details for Transfer Assistance: verbal cues for safe technique, 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 and reports he has shoe but not here Ambulation/Gait Ambulation/Gait Assistance: 4: Min assist Ambulation Distance (Feet): 80 Feet Assistive device: Rolling walker Ambulation/Gait Assistance Details: mild antalgic type gait due to foot amputation, occasional assist for balance Gait Pattern: Step-through pattern;Wide base of support;Antalgic;Decreased dorsiflexion - right General  Gait Details: fatigued quickly    Exercises     PT Diagnosis: Difficulty walking  PT Problem List: Decreased activity tolerance;Decreased knowledge of use of DME;Decreased mobility;Decreased balance;Decreased safety awareness PT Treatment Interventions: DME instruction;Gait training;Functional mobility training;Therapeutic activities;Therapeutic exercise;Patient/family education;Balance training   PT Goals Acute Rehab PT Goals PT Goal Formulation: With patient Time For Goal Achievement: 03/06/12 Potential to Achieve Goals: Good Pt will go Sit to Stand: with supervision PT Goal: Sit to Stand - Progress: Goal set today Pt will go Stand to Sit: with supervision PT Goal: Stand to Sit - Progress: Goal set today Pt will Ambulate: with supervision;51 - 150 feet;with least restrictive assistive device PT Goal: Ambulate - Progress: Goal set today Pt will Perform Home Exercise Program: with supervision, verbal cues required/provided PT Goal: Perform Home Exercise Program - Progress: Goal set today  Visit Information  Last PT Received On: 02/28/12 Assistance Needed: +1    Subjective Data  Subjective: therapist asked pt if he falls at home and he stated that was why he was here   Prior [place] Lives With: Spouse Type of Home: House Home Access: Level entry Home Layout: One level Home Adaptive Equipment: Straight cane Prior Function Level of Independence: Independent with assistive device(s) Communication Communication: No difficulties    Cognition  Cognition Overall Cognitive Status: Appears within functional limits for tasks assessed/performed Arousal/Alertness: Awake/alert    Extremity/Trunk Assessment Right Lower Extremity Assessment RLE ROM/Strength/Tone: [facility] for tasks assessed Left Lower Extremity Assessment LLE ROM/Strength/Tone: [facility] for tasks assessed   Balance    End of Session PT - End of Session Activity Tolerance: Patient limited by fatigue Patient  left: in bed;with call bell/phone within  reach;with bed alarm set (with [facility] case manager)  GP     York Ram E 02/28/2012, 4:43 PM  Carmelia Bake, PT, DPT 02/28/2012 Pager: [phone]

## 2012-02-28 NOTE — Progress Notes (Signed)
Patient evaluated for long-term disease management services with [place] [facility]. Patient will receive a post discharge transition of care call and evaluated for monthly home visits for assessments and for education. Spoke with Mr Durrell at bedside who states he lives with his wife. Explained [facility] Care Management services and how [facility] Care Management can benefit him. Attempted to contact wife at bedside via phone. However, voicemail came on.  Consents signed at bedside. [facility] Care Management packet as well as contact information left with patient.  Marthenia Rolling, MSN- Ed, RN,BSN [facility] Liaison [phone]

## 2012-02-28 NOTE — Care Management Note (Signed)
    Page 1 of 2   03/01/2012     12:56:06 PM   CARE MANAGEMENT NOTE 03/01/2012  Patient:  Colin [name], Colin [name]   Account Number:  [id number]  Date Initiated:  02/28/2012  Documentation initiated by:  Dessa Phi  Subjective/Objective Assessment:   ADMITTED W/WEAKNESS.DM.HX:DM,CHF.     Action/Plan:   FROM HOME W/SPOUSE.HAS PCP,PHARMACY, & RW.   Anticipated DC Date:  03/01/2012   Anticipated DC Plan:  [place]  CM consult      Choice offered to / List presented to:  C-1 Patient        [place] arranged  HH-1 RN  [place].   Status of service:  Completed, signed off Medicare Important Message given?   (If response is "NO", the following Medicare IM given date fields will be blank) Date Medicare IM given:   Date Additional Medicare IM given:    Discharge Disposition:  [place]  Per UR Regulation:  Reviewed for med. necessity/level of care/duration of stay  If discussed at [place] of Stay Meetings, dates discussed:    Comments:  03/01/12 Alita Waldren RN,BSN NCM 18 [place].SUSAN(LIASON) AWARE OF D/C & HH ORDERS.  02/29/12 Juanangel Soderholm RN,BSN NCM [phone] PT/OT-HH.PROVIDED PATIENT [place] AGENCY LIST.  02/28/12 Keyona Emrich RN,BSN NCM [phone]

## 2012-02-28 NOTE — Progress Notes (Signed)
INITIAL NUTRITION ASSESSMENT  DOCUMENTATION CODES Per approved criteria  -Not Applicable    INTERVENTION: Attempted diabetes education on carbohydrate choices; unsuccessful- will follow up with wife  NUTRITION DIAGNOSIS: Unintentional wt loss related to diabetes as evidenced by wt loss of 20 lbs in <4 months and HGBA1C 10.3.  Goal: Glucose WNL Wt maintenance PO intake >75% of meals Pt/ pt's wife to be able to identify 3 sources of carbohydrates  Monitor:  Glucose Wt PO intake  Reason for Assessment: MST 2  79 y.o. male  Admitting Dx: Type 2 diabetes mellitus with hyperosmolar nonketotic hyperglycemia  ASSESSMENT: Pt reports that his appetite is usually good. RN states he has been clearing his plate while in the hospital. Pt reports that he takes insulin daily and checks blood sugar regularly but, blood sugar usually runs high. Pt states he has not received diabetes nutrition education and would like to know more about eating the right foods to maintain normal blood glucose. Education attempted; pt did not voice understanding despite multiple attempts.  Pt reports he weighed 200 lbs for a long time. Pt states wife started cutting out certain foods from his diet in January such as coffee with sugar, sweets, and potatoes. Wt loss possibly related to recent diet restrictions.  Height: Ht Readings from Last 1 Encounters:  02/27/12 5\' 11"  (1.803 m)    Weight: Wt Readings from Last 1 Encounters:  02/27/12 175 lb 11.2 oz (79.697 kg)    Ideal Body Weight: 172  % Ideal Body Weight: 102%  Wt Readings from Last 10 Encounters:  02/27/12 175 lb 11.2 oz (79.697 kg)  01/09/12 187 lb 13.3 oz (85.2 kg)  01/01/12 187 lb 12.8 oz (85.186 kg)  12/29/11 190 lb (86.183 kg)  12/07/11 195 lb (88.451 kg)  08/17/11 188 lb 12.8 oz (85.639 kg)  07/21/11 188 lb (85.276 kg)  07/21/11 188 lb (85.276 kg)  06/09/11 188 lb 3.2 oz (85.367 kg)  04/10/11 197 lb (89.359 kg)    Usual Body Weight:  195 lb  % Usual Body Weight: 90%  BMI:  Body mass index is 24.51 kg/(m^2).  Estimated Nutritional Needs: Kcal: [id number] Protein: 80-95 Fluid: >1.7L  Skin: Amputated toes RLE; WNL  Diet Order: Carb Control  EDUCATION NEEDS: -Education needs addressed Gave " Type 2 Diabetes Nutrition Therapy" from [facility]; RD name and phone number provided  No intake or output data in the 24 hours ending 02/28/12 1122  Last BM: 2/1  Labs:   Lab 02/28/12 0430 02/27/12 2320 02/27/12 1725  NA 129* -- 123*  K 3.7 -- 5.6*  CL 96 -- 89*  CO2 22 -- 18*  BUN 79* -- 82*  CREATININE 2.71* 2.85* 3.02*  CALCIUM 9.4 -- 9.9  MG 2.3 -- --  PHOS -- -- --  GLUCOSE 306* -- 616*   Lab Results  Component Value Date   HGBA1C 10.3* 01/09/2012     CBG (last 3)   Basename 02/28/12 0730 02/27/12 2327 02/27/12 2223  GLUCAP 278* 391* 370*    Scheduled Meds:   . aspirin EC  81 mg Oral Daily  . carvedilol  12.5 mg Oral Daily  . docusate sodium  100 mg Oral BID  . ezetimibe  10 mg Oral Daily  . heparin  5,000 Units Subcutaneous Q8H  . insulin aspart  0-5 Units Subcutaneous QHS  . insulin aspart  0-9 Units Subcutaneous TID WC  . insulin aspart protamine-insulin aspart  17 Units Subcutaneous BID WC  . omega-3 acid  ethyl esters  1 g Oral Daily  . pantoprazole  40 mg Oral Daily  . sodium chloride  3 mL Intravenous Q12H    Continuous Infusions:   . sodium chloride 75 mL/hr at 02/27/12 1803    Past Medical History  Diagnosis Date  . Coronary artery disease     Status post CABG in 2004  . Peripheral vascular disease     Status post right femoropopliteal bypass  . Dyslipidemia   . Diabetes mellitus with peripheral artery disease   . Hypertension   . Chronic renal insufficiency     creatinine around 2.4  . CHF (congestive heart failure) 2007    ef 38%  . History of CVA (cerebrovascular accident)   . Cardiomyopathy   . COPD (chronic obstructive pulmonary disease)   . History of anemia      Past Surgical History  Procedure Date  . Insert / replace / remove pacemaker 05/17/2005    [employer] [facility] dual chamber ddd mode   . Coronary artery bypass graft 4 of 2004    4 vessel bypass  . Femoral bypass july 2006    right with right great toe amputation  . Cardiac catheterization 04/29/2002    Dr. Glade Lloyd  . Esophagogastroduodenoscopy 07/21/2011    Procedure: ESOPHAGOGASTRODUODENOSCOPY (EGD);  Surgeon: Beryle Beams, MD;  Location: [facility];  Service: Endoscopy;  Laterality: N/A;   Pryor Ochoa RD, LDN Inpatient Clinical Dietitian Pager: [phone] After Hours Pager: [phone]

## 2012-02-28 NOTE — Progress Notes (Signed)
Dr. Baltazar Najjar called back concerning EKG results - has no new orders. Labs to be drawn at 05:00. Will continue to monitor pt. - Carmon Ginsberg, RN

## 2012-02-29 DIAGNOSIS — J449 Chronic obstructive pulmonary disease, unspecified: Secondary | ICD-10-CM

## 2012-02-29 LAB — GLUCOSE, CAPILLARY
Glucose-Capillary: 43 mg/dL — CL (ref 70–99)
Glucose-Capillary: 45 mg/dL — ABNORMAL LOW (ref 70–99)
Glucose-Capillary: 67 mg/dL — ABNORMAL LOW (ref 70–99)

## 2012-02-29 LAB — CBC
MCH: 27.8 pg (ref 26.0–34.0)
MCHC: 34 g/dL (ref 30.0–36.0)
Platelets: 170 10*3/uL (ref 150–400)

## 2012-02-29 LAB — BASIC METABOLIC PANEL
Calcium: 9.2 mg/dL (ref 8.4–10.5)
GFR calc non Af Amer: 26 mL/min — ABNORMAL LOW (ref >90)
Sodium: 130 mEq/L — ABNORMAL LOW (ref 135–145)

## 2012-02-29 MED ORDER — GLUCOSE-VITAMIN C 4-6 GM-MG PO CHEW: 4.0000 | CHEWABLE_TABLET | ORAL | Status: DC | PRN

## 2012-02-29 MED ORDER — GLUCOSE-VITAMIN C 4-6 GM-MG PO CHEW
CHEWABLE_TABLET | ORAL | Status: AC
  Administered 2012-02-29: 4 g
  Filled 2012-02-29: qty 1

## 2012-02-29 MED ORDER — GLUCAGON HCL (RDNA) 1 MG IJ SOLR
INTRAMUSCULAR | Status: AC
  Filled 2012-02-29: qty 1

## 2012-02-29 NOTE — Progress Notes (Signed)
Call to pharmacy for pt's 70/30 insulin as it is not in his drawer. Voiced urgency.

## 2012-02-29 NOTE — Progress Notes (Addendum)
[facility] PROGRESS NOTE  Colin [name] [id number] DOB: [birth year] DOA: 02/27/2012 PCP: Horatio Pel, MD  Assessment/Plan: 1. DM- hyperosmolar hyperglycemia-  SSI, insulin- has not been following a diabetic diet- HgbA1C 10- increased 70/30- would benefit from having home health RN to help regulate his medications as both he and his daughter expressed confusion 2. CKD- IVF, improving (baseline 2) 3. H/o CHF- 30-35% EF on 4/13 4. Non complaince- not taking meds, ? Depression vs dementia 5. PT/OT eval- lives at home with wife- will arrange [place] to follow at home 6. ? FTT- outpatient malignancy work up 7. Hyponatremia- improved with IVF and better control of BS  Code Status: full Family Communication: patient at bedside Disposition Plan: home tomm   Consultants:  none  Procedures:    Antibiotics:    HPI/Subjective: Wanting to go home, no new c/o    Objective: Filed Vitals:   02/28/12 1333 02/28/12 2052 02/29/12 0442 02/29/12 0500  BP: 147/56 135/62 141/84   Pulse: 81 67 67   Temp: 98.4 F (36.9 C) 98.5 F (36.9 C) 98.3 F (36.8 C)   TempSrc: Oral Oral Oral   Resp: 18 18 16    Height:      Weight:    82.4 kg (181 lb 10.5 oz)  SpO2: 99% 100% 100%     Intake/Output Summary (Last 24 hours) at 02/29/12 1219 Last data filed at 02/29/12 0800  Gross per 24 hour  Intake   1320 ml  Output    800 ml  Net    520 ml   Filed Weights   02/27/12 2245 02/29/12 0500  Weight: 79.697 kg (175 lb 11.2 oz) 82.4 kg (181 lb 10.5 oz)    Exam:   General:  Pleasant/cooperative  Cardiovascular: rrr  Respiratory: clear anterior  Abdomen: +BS, soft, NT  Data Reviewed: Basic Metabolic Panel:  Lab [id number] 02/28/12 0430 02/27/12 2320 02/27/12 1725  NA 130* 129* -- 123*  K 3.9 3.7 -- 5.6*  CL 100 96 -- 89*  CO2 20 22 -- 18*  GLUCOSE 210* 306* -- 616*  BUN 64* 79* -- 82*  CREATININE 2.22* 2.71* 2.85* 3.02*  CALCIUM 9.2 9.4 -- 9.9  MG -- 2.3  -- --  PHOS -- -- -- --   Liver Function Tests:  Lab 02/27/12 1725  AST 30  ALT 33  ALKPHOS 104  BILITOT 0.4  PROT 7.8  ALBUMIN 3.4*   No results found for this basename: LIPASE:5,AMYLASE:5 in the last 168 hours No results found for this basename: AMMONIA:5 in the last 168 hours CBC:  Lab 02/29/12 0520 02/27/12 2320 02/27/12 1725  WBC 5.2 5.6 5.7  NEUTROABS -- -- 3.8  HGB 11.5* 13.5 13.7  HCT 33.8* 39.2 40.8  MCV 81.8 81.2 83.1  PLT 170 195 169   Cardiac Enzymes:  Lab 02/27/12 1725  CKTOTAL --  CKMB --  CKMBINDEX --  TROPONINI <0.30   BNP (last 3 results)  Basename 02/27/12 1725 06/09/11 1122 04/10/11 1034  PROBNP 488.1* 92.0 425.0*   CBG:  Lab 02/28/12 2217 02/28/12 1750 02/28/12 1239 02/28/12 0730 02/27/12 2327  GLUCAP 152* 265* 304* 278* 391*    No results found for this or any previous visit (from the past 240 hour(s)).   Studies: Dg Chest 2 View  02/27/2012  *RADIOLOGY REPORT*  Clinical Data: Short of breath  CHEST - 2 VIEW  Comparison: 10/26/2011  Findings: Right hemidiaphragm remains elevated.  Dual lead right subclavian pacemaker device and  leads are stable.  Postoperative changes from coronary artery bypass graft.  Small right pleural effusion has developed.  Low lung volumes.  No pneumothorax.  IMPRESSION: New small right pleural effusion.  Otherwise stable exam.   Original Report Authenticated By: Marybelle Killings, M.D.    Ct Head Wo Contrast  02/27/2012  *RADIOLOGY REPORT*  Clinical Data: Dizziness, weakness, frequent falls in past 2 weeks, struck head during one of falls, history diabetes, hypertension, coronary artery disease, dyslipidemia, COPD, stroke, and chronic renal insufficiency  CT HEAD WITHOUT CONTRAST  Technique:  Contiguous axial images were obtained from the base of the skull through the vertex without contrast.  Comparison: 01/09/2012  Findings: Generalized atrophy. Normal ventricular morphology. No midline shift or mass effect. Small vessel  chronic ischemic changes of deep cerebral white matter. No intracranial hemorrhage, mass lesion, or acute infarction. Questionable tiny old lacunar infarct at right external capsule. No extra-axial fluid collections. Visualized paranasal sinuses and mastoid air cells clear. Atherosclerotic calcifications of the internal carotid arteries bilaterally at the skull base. Again identified mixed lytic and sclerotic process involving the left sphenoid, unchanged, question fibrous dysplasia.  IMPRESSION: Atrophy with small vessel chronic ischemic changes of deep cerebral white matter. Suspect tiny old lacunar infarct at right external capsule. No acute intracranial abnormalities.   Original Report Authenticated By: Lavonia Dana, M.D.     Scheduled Meds:    . aspirin EC  81 mg Oral Daily  . carvedilol  12.5 mg Oral Daily  . docusate sodium  100 mg Oral BID  . ezetimibe  10 mg Oral Daily  . heparin  5,000 Units Subcutaneous Q8H  . insulin aspart  0-5 Units Subcutaneous QHS  . insulin aspart  0-9 Units Subcutaneous TID WC  . insulin aspart protamine-insulin aspart  20 Units Subcutaneous BID WC  . omega-3 acid ethyl esters  1 g Oral Daily  . pantoprazole  40 mg Oral Daily  . sodium chloride  3 mL Intravenous Q12H   Continuous Infusions:   Principal Problem:  *Type 2 diabetes mellitus with hyperosmolar nonketotic hyperglycemia Active Problems:  Cardiac pacemaker in situ  Chronic left-sided CHF (congestive heart failure)  Acute kidney injury  History of CVA (cerebrovascular accident)  Diabetes mellitus with peripheral artery disease  Failure to thrive in adult  Non-compliant patient    Time spent: [place], [facility] Pager [phone]. If 8PM-8AM, please contact night-coverage at [email or web address], password [facility] 02/29/2012, 12:19 PM  LOS: 2 days

## 2012-02-29 NOTE — Progress Notes (Signed)
  Pharmacist Heart Failure Core Measure Documentation  Assessment: Colin [name] has an EF documented as 30-35% on 05/09/11 by 2D ECHO.  Rationale: Heart failure patients with left ventricular systolic dysfunction (LVSD) and an EF < 40% should be prescribed an angiotensin converting enzyme inhibitor (ACEI) or angiotensin receptor blocker (ARB) at discharge unless a contraindication is documented in the medical record.  This patient is not currently on an ACEI or ARB for HF.  This note is being placed in the record in order to provide documentation that a contraindication to the use of these agents is present for this encounter.  ACE Inhibitor or Angiotensin Receptor Blocker is contraindicated (specify all that apply)  []   ACEI allergy AND ARB allergy []   Angioedema []   Moderate or severe aortic stenosis []   Hyperkalemia []   Hypotension []   Renal artery stenosis [x]   Worsening renal function, preexisting renal disease or dysfunction   Samul Dada 02/29/2012 2:48 PM

## 2012-02-29 NOTE — Evaluation (Signed)
Occupational Therapy Evaluation Patient Details Name: Colin [name] MRN: [id number] DOB: 1928/12/05 Today's Date: 02/29/2012 Time: 0940-1005 OT Time Calculation (min): 25 min  OT Assessment / Plan / Recommendation Clinical Impression  Pt admitted for weakness and DM- hyperosmolar hyperglycemia.  Pt would benefit from acute OT services in order to improve safety and independence with transfers and ADL activity.   Recommend 24/7 supervision at home for safety as pt reports hx of falls.     OT Assessment  Patient needs continued OT Services    Follow Up Recommendations  Home health OT             Frequency  Min 2X/week    Precautions / Restrictions Precautions Precautions: Fall       ADL  Grooming: Performed;Wash/dry face;Set up Where Assessed - Grooming: Unsupported sitting Upper Body Bathing: Simulated;Set up Where Assessed - Upper Body Bathing: Unsupported sitting Lower Body Bathing: Simulated;Minimal assistance Where Assessed - Lower Body Bathing: Supported sit to stand Upper Body Dressing: Simulated;Set up Where Assessed - Upper Body Dressing: Unsupported sitting Lower Body Dressing: Performed;Minimal assistance Where Assessed - Lower Body Dressing: Supported sit to stand Toilet Transfer: Simulated;Minimal assistance Toilet Transfer Method: Sit to stand;Stand pivot Science writer: Other (comment) (bed to chair) Where Assessed - Toileting Clothing Manipulation and Hygiene: Standing Tub/Shower Transfer Method: Not assessed Transfers/Ambulation Related to ADLs: Pt currently has a condom cath.  Pt states wife can provide help as needed    OT Diagnosis: Generalized weakness  OT Problem List: Decreased strength OT Treatment Interventions: Self-care/ADL training;DME and/or AE instruction;Patient/family education   OT Goals Acute Rehab OT Goals OT Goal Formulation: With patient Time For Goal Achievement: 03/14/12 Potential to Achieve Goals: Good ADL  Goals Pt Will Perform Grooming: with modified independence;Standing at sink ADL Goal: Grooming - Progress: Goal set today Pt Will Perform Lower Body Dressing: with modified independence;Sit to stand from chair ADL Goal: Lower Body Dressing - Progress: Goal set today Pt Will Transfer to Toilet: with modified independence;Comfort height toilet ADL Goal: Toilet Transfer - Progress: Goal set today Pt Will Perform Toileting - Clothing Manipulation: with modified independence ADL Goal: Toileting - Clothing Manipulation - Progress: Goal set today  Visit Information  Last OT Received On: 02/29/12    Subjective Data  Subjective: I have fallen a couple of times in last 6 monthes   Prior [place] Shower/Tub: Chiropodist: Standard Home Adaptive Equipment: Shower chair with back         Vision/Perception Vision - History Patient Visual Report: No change from baseline   Cognition  Cognition Overall Cognitive Status: Appears within functional limits for tasks assessed/performed Arousal/Alertness: Awake/alert Orientation Level: Appears intact for tasks assessed Behavior During Session: [facility] for tasks performed    Extremity/Trunk Assessment Right Upper Extremity Assessment RUE ROM/Strength/Tone: [facility] for tasks assessed Left Upper Extremity Assessment LUE ROM/Strength/Tone: [facility] for tasks assessed     Mobility Bed Mobility Bed Mobility: Supine to Sit;Sit to Supine Supine to Sit: 5: Supervision Sit to Supine: 5: Supervision Details for Bed Mobility Assistance: verbal cues for foley, blankets Transfers Sit to Stand: 4: Min guard;With upper extremity assist;From bed Stand to Sit: 4: Min guard;With upper extremity assist;To bed Details for Transfer Assistance: verbal cues for safe technique, 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

## 2012-02-29 NOTE — Progress Notes (Signed)
70/30 insulin now adm.

## 2012-02-29 NOTE — Progress Notes (Signed)
Physical Therapy Treatment Patient Details Name: Colin [name] MRN: [id number] DOB: 09-07-1928 Today's Date: 02/29/2012 Time: [id number] PT Time Calculation (min): 27 min  PT Assessment / Plan / Recommendation Comments on Treatment Session  Pt admitted 2nd fall/syncope.  Feeling better today.  Amb in hallway twice with RW and while wearing his shoes.  Pt is s/p R LE partial foot amb and has an orthotic custum shoe.  Pt c/o mild R hip pain that travels to his knee and c/o neuropathy.   Pt plans to D/C back home with his spouse.     Follow Up Recommendations  Home health PT     Does the patient have the potential to tolerate intense rehabilitation     Barriers to Discharge        Equipment Recommendations  Rolling walker with 5" wheels    Recommendations for Other Services    Frequency Min 3X/week   Plan Discharge plan remains appropriate    Precautions / Restrictions Precautions Precautions: Fall Precaution Comments: wera shoes for amb s/p partial foot amputation R LE Restrictions Weight Bearing Restrictions: No   Pertinent Vitals/Pain C/o "stifness" and mild arthritic hip pain    Mobility  Bed Mobility Bed Mobility: Sit to Supine Sit to Supine: 5: Supervision Details for Bed Mobility Assistance: Assisted back to bed with increased time Transfers Transfers: Sit to Stand;Stand to Sit Sit to Stand: 4: Min guard;From chair/3-in-1 Stand to Sit: 4: Min guard;To bed;To chair/3-in-1 Details for Transfer Assistance: increased time with sit to stand and stand to sit with mild c/o R hip pain and stiffness Ambulation/Gait Ambulation/Gait Assistance: 4: Min guard Ambulation Distance (Feet): 240 Feet (120' x2) Assistive device: Rolling walker Ambulation/Gait Assistance Details: <25% VC's safety with turns and upright posture. Pt c/o stiffness and at time R hip pain that travels to the knee.  Gait Pattern: Step-through pattern;Decreased stance time - right Gait velocity: decreased      PT Goals                                                          progressing    Visit Information  Last PT Received On: 02/29/12    Subjective Data  Subjective: My R hip hurts and I get stiff   Cognition    good   Balance   good with RW  End of Session PT - End of Session Equipment Utilized During Treatment: Gait belt Activity Tolerance: Patient limited by fatigue Patient left: in bed;with call bell/phone within reach Nurse Communication: Mobility status (staff observered)   Rica Koyanagi  PTA [facility]  Acute  Rehab Pager      [phone]

## 2012-03-01 LAB — GLUCOSE, CAPILLARY: Glucose-Capillary: 387 mg/dL — ABNORMAL HIGH (ref 70–99)

## 2012-03-01 MED ORDER — INSULIN ASPART PROT & ASPART (70-30 MIX) 100 UNIT/ML ~~LOC~~ SUSP: 18.0000 [IU] | Freq: Two times a day (BID) | SUBCUTANEOUS | Status: DC

## 2012-03-01 MED ORDER — INSULIN ASPART PROT & ASPART (70-30 MIX) 100 UNIT/ML ~~LOC~~ SUSP: 25.0000 [IU] | Freq: Two times a day (BID) | SUBCUTANEOUS | Status: DC

## 2012-03-01 MED ORDER — GLUCOSE-VITAMIN C 4-6 GM-MG PO CHEW: 4.0000 | CHEWABLE_TABLET | ORAL | Status: DC | PRN

## 2012-03-01 MED ORDER — INSULIN ASPART PROT & ASPART (70-30 MIX) 100 UNIT/ML ~~LOC~~ SUSP
17.0000 [IU] | Freq: Two times a day (BID) | SUBCUTANEOUS | Status: DC
  Administered 2012-03-01: 17 [IU] via SUBCUTANEOUS
  Filled 2012-03-01: qty 10

## 2012-03-01 NOTE — Discharge Summary (Signed)
Physician Discharge Summary  Colin [name] [id number] DOB: May 28, 1928 DOA: 02/27/2012  PCP: Horatio Pel, MD  Admit date: 02/27/2012 Discharge date: 03/01/2012  Time spent: 30 minutes  Recommendations for Outpatient Follow-up:  1. Home health 2. Will need cautious titration of 70/30 with concern for low blood sugar 3. Hold lasix until repeat BMP done- watch for LE swelling  Discharge Diagnoses:  Principal Problem:  *Type 2 diabetes mellitus with hyperosmolar nonketotic hyperglycemia Active Problems:  Cardiac pacemaker in situ  Chronic left-sided CHF (congestive heart failure)  Acute kidney injury  History of CVA (cerebrovascular accident)  Diabetes mellitus with peripheral artery disease  Failure to thrive in adult  Non-compliant patient   Discharge Condition: improved  Diet recommendation: cardiac/diabetic  Filed Weights   02/27/12 2245 02/29/12 0500 03/01/12 0500  Weight: 79.697 kg (175 lb 11.2 oz) 82.4 kg (181 lb 10.5 oz) 83.2 kg (183 lb 6.8 oz)    History of present illness:  Colin [name] is an 79 y.o. male with hx of recurrent syncope, s/p ppm, HTN, DM, CHF (35%EF), hyperlipidemia, brought in by family as he has been feeling weak, had a mechanical fall a few days ago, and decreased PO intake. Evaluation in the ER showed hyperglycemia with BS 600, HCO3 18, Na 123, K of 5.6 and BUN/Cr of 82/3.02. His CXR showed slight right sided pleural effusion. His head CT showed chronic changes, none acute. He was given IVF of 500cc, and some IV insulin, and hospitalist was asked to admit him for hyperglycemia and dehydration. He thought sometimes he is depressed. He is not suicidal. He has not taken his medications at all, sometimes for a month at a time. Denied any SOB, CP, HA, abdominal pain. He doesn't think he had a colonoscopy in a while. No significant weight loss. His family thought he may have been a little forgetful. He has loss his appetite of late.   Hospital  Course:   DM- hyperosmolar hyperglycemia- SSI, insulin- has not been following a diabetic diet- HgbA1C 10- increased 70/30 (had 1 episode of low BS so may need to tolerate higher BS to avoid too low)- would benefit from having home health RN to help regulate his medications as both he and his daughter expressed confusion over how to take- need counseling on a diabetic diet  CKD- IVF, improving (baseline 2)  H/o CHF- 30-35% EF on [id number]- Chronic systolic heart failure  Non complaince- not taking meds, ? Depression vs dementia  PT/OT eval- lives at home with wife- will arrange [place] to follow at home  ? FTT- outpatient malignancy work up  Hyponatremia- improved with IVF and better control of BS    Procedures:  none  Consultations:  none  Discharge Exam: Filed Vitals:   02/29/12 2137 03/01/12 0500 03/01/12 0514 03/01/12 0744  BP: 145/65  123/60 136/61  Pulse: 67  67 67  Temp: 98.5 F (36.9 C)  97.9 F (36.6 C)   TempSrc: Oral  Oral   Resp: 18  16 14   Height:      Weight:  83.2 kg (183 lb 6.8 oz)    SpO2: 98%  100% 100%    General: pleasant/cooperative Cardiovascular: rrr Respiratory: clear anterior  Discharge Instructions  Discharge Orders    Future Orders Please Complete By Expires   Diet - low sodium heart healthy      Diet Carb Modified      Increase activity slowly      Discharge instructions  Comments:   [facility] to follow Check BS at home- bring to PCP (will need adjustment of his 70/30) Hold lasix until after BMP (have done in 1 week) Home health PT/OT/RN       Medication List     As of 03/01/2012  9:57 AM    STOP taking these medications         furosemide 40 MG tablet   Commonly known as: LASIX      insulin NPH-insulin regular (70-30) 100 UNIT/ML injection   Commonly known as: NOVOLIN 70/30      TAKE these medications         aspirin 81 MG EC tablet   Take 1 tablet (81 mg total) by mouth daily.      B-complex with vitamin C tablet    Take 1 tablet by mouth daily.      carvedilol 25 MG tablet   Commonly known as: COREG   Take 12.5 mg by mouth daily.      ezetimibe 10 MG tablet   Commonly known as: ZETIA   Take 10 mg by mouth daily.      Fish Oil 1000 MG Caps   Take 1,000 mg by mouth daily.      glucose-Vitamin C 4-0.006 GM Chew   Chew 4 tablets by mouth as needed (CBG less than 70 or CBG greater than 70 and next meal more than 1 hour away).      insulin aspart protamine-insulin aspart (70-30) 100 UNIT/ML injection   Commonly known as: NOVOLOG 70/30   Inject 18 Units into the skin 2 (two) times daily with a meal.      NEEDLE (DISP) 18 G 18G X 1-1/2" Misc   1 Container by Does not apply route 2 (two) times daily with a meal.      omeprazole 40 MG capsule   Commonly known as: PRILOSEC   Take 40 mg by mouth daily as needed. Acid indigestion      VITAMIN B 12 PO   Take by mouth. 1000 mcg daily           Follow-up Information    Follow up with Horatio Pel, MD. In 1 week.   Contact information:   [address] [place]  [place] [address] [phone]           The results of significant diagnostics from this hospitalization (including imaging, microbiology, ancillary and laboratory) are listed below for reference.    Significant Diagnostic Studies: Dg Chest 2 View  02/27/2012  *RADIOLOGY REPORT*  Clinical Data: Short of breath  CHEST - 2 VIEW  Comparison: 10/26/2011  Findings: Right hemidiaphragm remains elevated.  Dual lead right subclavian pacemaker device and leads are stable.  Postoperative changes from coronary artery bypass graft.  Small right pleural effusion has developed.  Low lung volumes.  No pneumothorax.  IMPRESSION: New small right pleural effusion.  Otherwise stable exam.   Original Report Authenticated By: Marybelle Killings, M.D.    Ct Head Wo Contrast  02/27/2012  *RADIOLOGY REPORT*  Clinical Data: Dizziness, weakness, frequent falls in past 2 weeks, struck head during one  of falls, history diabetes, hypertension, coronary artery disease, dyslipidemia, COPD, stroke, and chronic renal insufficiency  CT HEAD WITHOUT CONTRAST  Technique:  Contiguous axial images were obtained from the base of the skull through the vertex without contrast.  Comparison: 01/09/2012  Findings: Generalized atrophy. Normal ventricular morphology. No midline shift or mass effect. Small vessel chronic ischemic changes of deep cerebral  white matter. No intracranial hemorrhage, mass lesion, or acute infarction. Questionable tiny old lacunar infarct at right external capsule. No extra-axial fluid collections. Visualized paranasal sinuses and mastoid air cells clear. Atherosclerotic calcifications of the internal carotid arteries bilaterally at the skull base. Again identified mixed lytic and sclerotic process involving the left sphenoid, unchanged, question fibrous dysplasia.  IMPRESSION: Atrophy with small vessel chronic ischemic changes of deep cerebral white matter. Suspect tiny old lacunar infarct at right external capsule. No acute intracranial abnormalities.   Original Report Authenticated By: Lavonia Dana, M.D.     Microbiology: No results found for this or any previous visit (from the past 240 hour(s)).   Labs: Basic Metabolic Panel:  Lab [id number] 02/28/12 0430 02/27/12 2320 02/27/12 1725  NA 130* 129* -- 123*  K 3.9 3.7 -- 5.6*  CL 100 96 -- 89*  CO2 20 22 -- 18*  GLUCOSE 210* 306* -- 616*  BUN 64* 79* -- 82*  CREATININE 2.22* 2.71* 2.85* 3.02*  CALCIUM 9.2 9.4 -- 9.9  MG -- 2.3 -- --  PHOS -- -- -- --   Liver Function Tests:  Lab 02/27/12 1725  AST 30  ALT 33  ALKPHOS 104  BILITOT 0.4  PROT 7.8  ALBUMIN 3.4*   No results found for this basename: LIPASE:5,AMYLASE:5 in the last 168 hours No results found for this basename: AMMONIA:5 in the last 168 hours CBC:  Lab 02/29/12 0520 02/27/12 2320 02/27/12 1725  WBC 5.2 5.6 5.7  NEUTROABS -- -- 3.8  HGB 11.5* 13.5 13.7   HCT 33.8* 39.2 40.8  MCV 81.8 81.2 83.1  PLT 170 195 169   Cardiac Enzymes:  Lab 02/27/12 1725  CKTOTAL --  CKMB --  CKMBINDEX --  TROPONINI <0.30   BNP: BNP (last 3 results)  Basename 02/27/12 1725 06/09/11 1122 04/10/11 1034  PROBNP 488.1* 92.0 425.0*   CBG:  Lab 03/01/12 0752 02/29/12 2247 02/29/12 2217 02/29/12 2157 02/29/12 2143  GLUCAP 236* 87 56* 67* 45*       Signed:  Anjela Cassara  [facility] 03/01/2012, 9:57 AM

## 2012-03-01 NOTE — Progress Notes (Signed)
Physical Therapy Treatment and D/C from acute PT Patient Details Name: Colin [name] MRN: [id number] DOB: [birth year] Today's Date: 03/01/2012 Time: [id number] PT Time Calculation (min): 13 min  PT Assessment / Plan / Recommendation Comments on Treatment Session  Pt assisted with donning shoes and then ambulated in hallway with RW.  Pt feeling much better and performing mobility well.  Pt states family member will be coming to pick him up today for d/c and he is agreeable to continue using RW at home.  Pt to d/c home today and has met goals at supervision level so PT to sign off.    Follow Up Recommendations  Home health PT     Does the patient have the potential to tolerate intense rehabilitation     Barriers to Discharge        Equipment Recommendations  None recommended by PT;Other (comment) (pt states he has RW)    Recommendations for Other Services    Frequency     Plan Discharge plan remains appropriate;Frequency remains appropriate    Precautions / Restrictions Precautions Precautions: Fall Precaution Comments: wear shoes for amb s/p partial foot amputation R LE   Pertinent Vitals/Pain n/a    Mobility  Bed Mobility Bed Mobility: Not assessed Details for Bed Mobility Assistance: sittin EOB on arrival Transfers Transfers: Stand to Sit;Sit to Stand Sit to Stand: 5: Supervision;From bed Stand to Sit: 5: Supervision;To chair/3-in-1 Details for Transfer Assistance: assisted pt with donning shoes prior to stand, good technique, pt denies dizziness Ambulation/Gait Ambulation/Gait Assistance: 5: Supervision Ambulation Distance (Feet): 320 Feet Assistive device: Rolling walker Ambulation/Gait Assistance Details: verbal cues for turning with RW, pt felt more comfortable with RW and did not wish to progress to cane yet, states has RW at home and will use upon d/c Gait Pattern: Step-through pattern;Trunk flexed Gait velocity: decreased    Exercises     PT Diagnosis:    PT  Problem List:   PT Treatment Interventions:     PT Goals Acute Rehab PT Goals PT Goal: Sit to Stand - Progress: Met PT Goal: Stand to Sit - Progress: Met PT Goal: Ambulate - Progress: Met PT Goal: Perform Home Exercise Program - Progress: Discontinued (comment) (f/u with HHPT)  Visit Information  Last PT Received On: 03/01/12 Assistance Needed: +1    Subjective Data  Subjective: I think I'm leaving today.   Cognition  Cognition Overall Cognitive Status: Appears within functional limits for tasks assessed/performed Arousal/Alertness: Awake/alert Orientation Level: Appears intact for tasks assessed Behavior During Session: [facility] for tasks performed    Balance     End of Session PT - End of Session Activity Tolerance: Patient tolerated treatment well Patient left: in chair;with call bell/phone within reach   GP     Supreme Rybarczyk,KATHrine E 03/01/2012, 11:49 AM Carmelia Bake, PT, DPT 03/01/2012 Pager: [phone]

## 2012-03-01 NOTE — Progress Notes (Signed)
Hypoglycemic Event  CBG: 45  Treatment: 3 glucose tabs  Symptoms: None  Follow-up CBG: Time:2130 CBG Result:87  Possible Reasons for Event: Unknown  Comments/MD notified:    Cleotis Lema  Remember to initiate Hypoglycemia Order Set & complete

## 2012-06-19 ENCOUNTER — Other Ambulatory Visit: Payer: Self-pay | Admitting: Internal Medicine

## 2012-07-01 ENCOUNTER — Ambulatory Visit (INDEPENDENT_AMBULATORY_CARE_PROVIDER_SITE_OTHER): Payer: Medicare Other | Admitting: Cardiovascular Disease

## 2012-07-01 ENCOUNTER — Encounter (HOSPITAL_COMMUNITY): Payer: Self-pay | Admitting: *Deleted

## 2012-07-01 ENCOUNTER — Emergency Department (HOSPITAL_COMMUNITY): Payer: Medicare Other

## 2012-07-01 ENCOUNTER — Observation Stay (HOSPITAL_COMMUNITY)
Admission: EM | Admit: 2012-07-01 | Discharge: 2012-07-02 | Disposition: A | Discharge status: Home / Self Care | Payer: Medicare Other | Attending: Internal Medicine | Admitting: Internal Medicine

## 2012-07-01 ENCOUNTER — Encounter: Payer: Self-pay | Admitting: Cardiovascular Disease

## 2012-07-01 VITALS — BP 157/71 | HR 59 | Ht 71.0 in | Wt 195.0 lb

## 2012-07-01 DIAGNOSIS — E785 Hyperlipidemia, unspecified: Secondary | ICD-10-CM

## 2012-07-01 DIAGNOSIS — E1101 Type 2 diabetes mellitus with hyperosmolarity with coma: Principal | ICD-10-CM | POA: Insufficient documentation

## 2012-07-01 DIAGNOSIS — R0609 Other forms of dyspnea: Secondary | ICD-10-CM | POA: Insufficient documentation

## 2012-07-01 DIAGNOSIS — E162 Hypoglycemia, unspecified: Secondary | ICD-10-CM

## 2012-07-01 DIAGNOSIS — W19XXXD Unspecified fall, subsequent encounter: Secondary | ICD-10-CM

## 2012-07-01 DIAGNOSIS — M79609 Pain in unspecified limb: Secondary | ICD-10-CM

## 2012-07-01 DIAGNOSIS — R680 Hypothermia, not associated with low environmental temperature: Secondary | ICD-10-CM | POA: Insufficient documentation

## 2012-07-01 DIAGNOSIS — I739 Peripheral vascular disease, unspecified: Secondary | ICD-10-CM

## 2012-07-01 DIAGNOSIS — Z862 Personal history of diseases of the blood and blood-forming organs and certain disorders involving the immune mechanism: Secondary | ICD-10-CM

## 2012-07-01 DIAGNOSIS — E1151 Type 2 diabetes mellitus with diabetic peripheral angiopathy without gangrene: Secondary | ICD-10-CM | POA: Diagnosis present

## 2012-07-01 DIAGNOSIS — I501 Left ventricular failure: Secondary | ICD-10-CM

## 2012-07-01 DIAGNOSIS — I798 Other disorders of arteries, arterioles and capillaries in diseases classified elsewhere: Secondary | ICD-10-CM | POA: Insufficient documentation

## 2012-07-01 DIAGNOSIS — R0989 Other specified symptoms and signs involving the circulatory and respiratory systems: Secondary | ICD-10-CM | POA: Insufficient documentation

## 2012-07-01 DIAGNOSIS — J449 Chronic obstructive pulmonary disease, unspecified: Secondary | ICD-10-CM | POA: Diagnosis present

## 2012-07-01 DIAGNOSIS — I2510 Atherosclerotic heart disease of native coronary artery without angina pectoris: Secondary | ICD-10-CM

## 2012-07-01 DIAGNOSIS — Z9119 Patient's noncompliance with other medical treatment and regimen: Secondary | ICD-10-CM

## 2012-07-01 DIAGNOSIS — N179 Acute kidney failure, unspecified: Secondary | ICD-10-CM

## 2012-07-01 DIAGNOSIS — R627 Adult failure to thrive: Secondary | ICD-10-CM

## 2012-07-01 DIAGNOSIS — T68XXXD Hypothermia, subsequent encounter: Secondary | ICD-10-CM

## 2012-07-01 DIAGNOSIS — N184 Chronic kidney disease, stage 4 (severe): Secondary | ICD-10-CM | POA: Diagnosis present

## 2012-07-01 DIAGNOSIS — Z79899 Other long term (current) drug therapy: Secondary | ICD-10-CM | POA: Insufficient documentation

## 2012-07-01 DIAGNOSIS — R3129 Other microscopic hematuria: Secondary | ICD-10-CM

## 2012-07-01 DIAGNOSIS — E1159 Type 2 diabetes mellitus with other circulatory complications: Secondary | ICD-10-CM

## 2012-07-01 DIAGNOSIS — I509 Heart failure, unspecified: Secondary | ICD-10-CM | POA: Insufficient documentation

## 2012-07-01 DIAGNOSIS — I5022 Chronic systolic (congestive) heart failure: Secondary | ICD-10-CM | POA: Diagnosis present

## 2012-07-01 DIAGNOSIS — J4489 Other specified chronic obstructive pulmonary disease: Secondary | ICD-10-CM | POA: Insufficient documentation

## 2012-07-01 DIAGNOSIS — R55 Syncope and collapse: Secondary | ICD-10-CM

## 2012-07-01 DIAGNOSIS — Z9181 History of falling: Secondary | ICD-10-CM | POA: Insufficient documentation

## 2012-07-01 DIAGNOSIS — N390 Urinary tract infection, site not specified: Secondary | ICD-10-CM | POA: Insufficient documentation

## 2012-07-01 DIAGNOSIS — Z8673 Personal history of transient ischemic attack (TIA), and cerebral infarction without residual deficits: Secondary | ICD-10-CM

## 2012-07-01 DIAGNOSIS — I129 Hypertensive chronic kidney disease with stage 1 through stage 4 chronic kidney disease, or unspecified chronic kidney disease: Secondary | ICD-10-CM | POA: Insufficient documentation

## 2012-07-01 DIAGNOSIS — Z951 Presence of aortocoronary bypass graft: Secondary | ICD-10-CM | POA: Insufficient documentation

## 2012-07-01 DIAGNOSIS — Z950 Presence of cardiac pacemaker: Secondary | ICD-10-CM | POA: Insufficient documentation

## 2012-07-01 DIAGNOSIS — N183 Chronic kidney disease, stage 3 unspecified: Secondary | ICD-10-CM | POA: Insufficient documentation

## 2012-07-01 DIAGNOSIS — E1100 Type 2 diabetes mellitus with hyperosmolarity without nonketotic hyperglycemic-hyperosmolar coma (NKHHC): Secondary | ICD-10-CM | POA: Diagnosis present

## 2012-07-01 DIAGNOSIS — T68XXXA Hypothermia, initial encounter: Secondary | ICD-10-CM

## 2012-07-01 DIAGNOSIS — W19XXXA Unspecified fall, initial encounter: Secondary | ICD-10-CM

## 2012-07-01 LAB — BASIC METABOLIC PANEL
GFR: 40.46 mL/min — ABNORMAL LOW (ref >60.00)
Potassium: 4.1 mEq/L (ref 3.5–5.1)
Sodium: 142 mEq/L (ref 135–145)

## 2012-07-01 LAB — GLUCOSE, CAPILLARY
Glucose-Capillary: 92 mg/dL (ref 70–99)
Glucose-Capillary: 160 mg/dL — ABNORMAL HIGH (ref 70–99)
Glucose-Capillary: 202 mg/dL — ABNORMAL HIGH (ref 70–99)
Glucose-Capillary: 182 mg/dL — ABNORMAL HIGH (ref 70–99)

## 2012-07-01 LAB — URINALYSIS, ROUTINE W REFLEX MICROSCOPIC
Bilirubin Urine: NEGATIVE
Glucose, UA: NEGATIVE mg/dL
Ketones, ur: NEGATIVE mg/dL
pH: 6 (ref 5.0–8.0)

## 2012-07-01 LAB — CBC WITH DIFFERENTIAL/PLATELET
Basophils Absolute: 0 10*3/uL (ref 0.0–0.1)
HCT: 39.1 % (ref 39.0–52.0)
Hemoglobin: 12.1 g/dL — ABNORMAL LOW (ref 13.0–17.0)
Lymphocytes Relative: 14 % (ref 12–46)
Monocytes Absolute: 0.4 10*3/uL (ref 0.1–1.0)
Monocytes Relative: 6 % (ref 3–12)
Neutro Abs: 5.4 10*3/uL (ref 1.7–7.7)
RDW: 15.3 % (ref 11.5–15.5)
WBC: 6.9 10*3/uL (ref 4.0–10.5)

## 2012-07-01 LAB — HEMOGLOBIN A1C: Mean Plasma Glucose: 174 mg/dL — ABNORMAL HIGH (ref <117)

## 2012-07-01 LAB — COMPREHENSIVE METABOLIC PANEL
ALT: 25 U/L (ref 0–53)
AST: 36 U/L (ref 0–37)
Alkaline Phosphatase: 81 U/L (ref 39–117)
CO2: 24 mEq/L (ref 19–32)
Chloride: 109 mEq/L (ref 96–112)
Creatinine, Ser: 1.93 mg/dL — ABNORMAL HIGH (ref 0.50–1.35)
GFR calc non Af Amer: 31 mL/min — ABNORMAL LOW (ref >90)
Total Bilirubin: 0.3 mg/dL (ref 0.3–1.2)

## 2012-07-01 LAB — TSH: TSH: 1.676 u[IU]/mL (ref 0.350–4.500)

## 2012-07-01 LAB — CREATININE, SERUM
Creatinine, Ser: 1.81 mg/dL — ABNORMAL HIGH (ref 0.50–1.35)
GFR calc non Af Amer: 33 mL/min — ABNORMAL LOW (ref >90)

## 2012-07-01 LAB — URINE MICROSCOPIC-ADD ON

## 2012-07-01 LAB — PRO B NATRIURETIC PEPTIDE: Pro B Natriuretic peptide (BNP): 1948 pg/mL — ABNORMAL HIGH (ref 0–450)

## 2012-07-01 LAB — TROPONIN I: Troponin I: <0.3 ng/mL (ref <0.30)

## 2012-07-01 LAB — OCCULT BLOOD, POC DEVICE: Fecal Occult Bld: NEGATIVE

## 2012-07-01 MED ORDER — HYDRALAZINE HCL 20 MG/ML IJ SOLN
10.0000 mg | INTRAMUSCULAR | Status: DC | PRN
  Administered 2012-07-01: 10 mg via INTRAVENOUS
  Filled 2012-07-01: qty 1

## 2012-07-01 MED ORDER — ALBUTEROL SULFATE (5 MG/ML) 0.5% IN NEBU: 2.5000 mg | INHALATION_SOLUTION | RESPIRATORY_TRACT | Status: DC | PRN

## 2012-07-01 MED ORDER — PANTOPRAZOLE SODIUM 40 MG PO TBEC
80.0000 mg | DELAYED_RELEASE_TABLET | Freq: Every day | ORAL | Status: DC
  Administered 2012-07-01 – 2012-07-02 (×2): 80 mg via ORAL
  Filled 2012-07-01: qty 2
  Filled 2012-07-01: qty 1

## 2012-07-01 MED ORDER — ONDANSETRON HCL 4 MG PO TABS: 4.0000 mg | ORAL_TABLET | Freq: Four times a day (QID) | ORAL | Status: DC | PRN

## 2012-07-01 MED ORDER — CARVEDILOL 6.25 MG PO TABS
6.2500 mg | ORAL_TABLET | Freq: Two times a day (BID) | ORAL | Status: DC
  Administered 2012-07-02: 6.25 mg via ORAL
  Filled 2012-07-01 (×3): qty 1

## 2012-07-01 MED ORDER — B COMPLEX-C PO TABS
1.0000 | ORAL_TABLET | Freq: Every day | ORAL | Status: DC
  Administered 2012-07-01 – 2012-07-02 (×2): 1 via ORAL
  Filled 2012-07-01 (×2): qty 1

## 2012-07-01 MED ORDER — ASPIRIN EC 81 MG PO TBEC
81.0000 mg | DELAYED_RELEASE_TABLET | Freq: Every day | ORAL | Status: DC
  Administered 2012-07-01 – 2012-07-02 (×2): 81 mg via ORAL
  Filled 2012-07-01 (×2): qty 1

## 2012-07-01 MED ORDER — HYDRALAZINE HCL 25 MG PO TABS
25.0000 mg | ORAL_TABLET | Freq: Three times a day (TID) | ORAL | Status: DC
  Administered 2012-07-01 – 2012-07-02 (×3): 25 mg via ORAL
  Filled 2012-07-01 (×5): qty 1

## 2012-07-01 MED ORDER — ENOXAPARIN SODIUM 30 MG/0.3ML ~~LOC~~ SOLN
30.0000 mg | SUBCUTANEOUS | Status: DC
  Administered 2012-07-01: 30 mg via SUBCUTANEOUS
  Filled 2012-07-01 (×2): qty 0.3

## 2012-07-01 MED ORDER — CARVEDILOL 6.25 MG PO TABS: 6.2500 mg | ORAL_TABLET | Freq: Two times a day (BID) | ORAL | Status: DC

## 2012-07-01 MED ORDER — ONDANSETRON HCL 4 MG/2ML IJ SOLN: 4.0000 mg | Freq: Four times a day (QID) | INTRAMUSCULAR | Status: DC | PRN

## 2012-07-01 MED ORDER — ACETAMINOPHEN 325 MG PO TABS: 650.0000 mg | ORAL_TABLET | Freq: Four times a day (QID) | ORAL | Status: DC | PRN

## 2012-07-01 MED ORDER — INSULIN ASPART 100 UNIT/ML ~~LOC~~ SOLN
0.0000 [IU] | Freq: Three times a day (TID) | SUBCUTANEOUS | Status: DC
  Administered 2012-07-02: 1 [IU] via SUBCUTANEOUS
  Administered 2012-07-02: 2 [IU] via SUBCUTANEOUS

## 2012-07-01 MED ORDER — POLYETHYLENE GLYCOL 3350 17 G PO PACK
17.0000 g | PACK | Freq: Every day | ORAL | Status: DC | PRN
  Filled 2012-07-01: qty 1

## 2012-07-01 MED ORDER — ACETAMINOPHEN 650 MG RE SUPP: 650.0000 mg | Freq: Four times a day (QID) | RECTAL | Status: DC | PRN

## 2012-07-01 MED ORDER — OMEGA-3-ACID ETHYL ESTERS 1 G PO CAPS
1.0000 g | ORAL_CAPSULE | Freq: Two times a day (BID) | ORAL | Status: DC
  Administered 2012-07-01 – 2012-07-02 (×2): 1 g via ORAL
  Filled 2012-07-01 (×3): qty 1

## 2012-07-01 MED ORDER — SODIUM CHLORIDE 0.9 % IJ SOLN
3.0000 mL | Freq: Two times a day (BID) | INTRAMUSCULAR | Status: DC
  Administered 2012-07-01 – 2012-07-02 (×2): 3 mL via INTRAVENOUS

## 2012-07-01 MED ORDER — EZETIMIBE 10 MG PO TABS
10.0000 mg | ORAL_TABLET | Freq: Every day | ORAL | Status: DC
  Administered 2012-07-01 – 2012-07-02 (×2): 10 mg via ORAL
  Filled 2012-07-01 (×2): qty 1

## 2012-07-01 MED ORDER — IPRATROPIUM BROMIDE 0.02 % IN SOLN: 0.5000 mg | RESPIRATORY_TRACT | Status: DC | PRN

## 2012-07-01 NOTE — Assessment & Plan Note (Signed)
[name] is history of chronic systolic congestive heart failure. We'll continue with his same medications. I would like to divide his carvedilol up into twice a day dosing. We will have him reduce his dose to 6.25 mg twice a day instead of taking 12.5 mg once a day.

## 2012-07-01 NOTE — ED Provider Notes (Signed)
History     CSN: [id number]  Arrival date & time 07/01/12  34   First MD Initiated Contact with Patient 07/01/12 1418      Chief Complaint  Patient presents with  . Hypoglycemia  . Fall    (Consider location/radiation/quality/duration/timing/severity/associated sxs/prior treatment) Patient is a 79 y.o. male presenting with syncope. The history is provided by the patient.  Loss of Consciousness Episode history:  Single Most recent episode:  Today Timing: once. Progression:  Unchanged Chronicity:  New Context: sitting down (he sat down on his bed and felt dizzy.)   Context: not blood draw and not bowel movement   Witnessed: yes   Relieved by:  Eating (Blood sugar at that time 54.) Ineffective treatments:  None tried Associated symptoms: recent fall (per EMS fell into the bathtub)   Associated symptoms: no chest pain, no difficulty breathing, no fever, no shortness of breath and no vomiting     Past Medical History  Diagnosis Date  . Coronary artery disease     Status post CABG in 2004  . Peripheral vascular disease     Status post right femoropopliteal bypass  . Dyslipidemia   . Diabetes mellitus with peripheral artery disease   . Hypertension   . Chronic renal insufficiency     creatinine around 2.4  . CHF (congestive heart failure) 2007    ef 38%  . History of CVA (cerebrovascular accident)   . Cardiomyopathy   . COPD (chronic obstructive pulmonary disease)   . History of anemia     Past Surgical History  Procedure Laterality Date  . Insert / replace / remove pacemaker  05/17/2005    [employer] dual chamber ddd mode   . Coronary artery bypass graft  4 of 2004    4 vessel bypass  . Femoral bypass  july 2006    right with right great toe amputation  . Cardiac catheterization  04/29/2002    Dr. Glade Lloyd  . Esophagogastroduodenoscopy  07/21/2011    Procedure: ESOPHAGOGASTRODUODENOSCOPY (EGD);  Surgeon: Beryle Beams, MD;  Location: [facility];  Service:  Endoscopy;  Laterality: N/A;    Family History  Problem Relation Age of Onset  . Hyperlipidemia Mother   . Heart disease Father     History  Substance Use Topics  . Smoking status: Former Smoker    Quit date: 06/09/2002  . Smokeless tobacco: Never Used  . Alcohol Use: No      Review of Systems  Constitutional: Negative for fever and chills.  Respiratory: Negative for cough and shortness of breath.   Cardiovascular: Positive for syncope. Negative for chest pain.  Gastrointestinal: Negative for vomiting and abdominal pain.  All other systems reviewed and are negative.    Allergies  Lisinopril  Home Medications   Current Outpatient Rx  Name  Route  Sig  Dispense  Refill  . aspirin EC 81 MG EC tablet   Oral   Take 1 tablet (81 mg total) by mouth daily.         . B Complex-C (B-COMPLEX WITH VITAMIN C) tablet   Oral   Take 1 tablet by mouth daily.         . carvedilol (COREG) 6.25 MG tablet   Oral   Take 1 tablet (6.25 mg total) by mouth 2 (two) times daily with a meal.   60 tablet   5     DECREASED DOSE   . Cyanocobalamin (VITAMIN B 12 PO)   Oral  Take by mouth. 1000 mcg daily          . ezetimibe (ZETIA) 10 MG tablet   Oral   Take 10 mg by mouth daily.           Marland Kitchen glucose-Vitamin C 4-0.006 GM CHEW   Oral   Chew 4 tablets by mouth as needed (CBG less than 70 or CBG greater than 70 and next meal more than 1 hour away).   90 tablet   1   . insulin aspart protamine-insulin aspart (NOVOLOG 70/30) (70-30) 100 UNIT/ML injection   Subcutaneous   Inject 18 Units into the skin 2 (two) times daily with a meal.   10 mL   0   . NEEDLE, DISP, 18 G 18G X 1-1/2" MISC   Does not apply   1 Container by Does not apply route 2 (two) times daily with a meal.   100 each   3   . Omega-3 Fatty Acids (FISH OIL) 1000 MG CAPS   Oral   Take 1,000 mg by mouth daily.          Marland Kitchen omeprazole (PRILOSEC) 40 MG capsule   Oral   Take 40 mg by mouth daily as  needed. Acid indigestion           BP 198/82  Pulse 60  Resp 14  SpO2 98%  Physical Exam  Nursing note and vitals reviewed. Constitutional: He is oriented to person, place, and time. He appears well-developed and well-nourished. No distress.  HENT:  Head: Normocephalic and atraumatic.  Mouth/Throat: No oropharyngeal exudate.  Eyes: EOM are normal. Pupils are equal, round, and reactive to light.  Neck: Normal range of motion. Neck supple.  Cardiovascular: Normal rate and regular rhythm.  Exam reveals no friction rub.   No murmur heard. Pulmonary/Chest: Effort normal and breath sounds normal. No respiratory distress. He has no wheezes. He has no rales.  Abdominal: He exhibits no distension. There is no tenderness. There is no rebound.  Musculoskeletal: Normal range of motion. He exhibits no edema.  Neurological: He is alert and oriented to person, place, and time. No cranial nerve deficit. He exhibits normal muscle tone. Coordination normal.  Skin: He is not diaphoretic.    ED Course  Procedures (including critical care time)  Labs Reviewed  CBC WITH DIFFERENTIAL - Abnormal; Notable for the following:    Hemoglobin 12.1 (*)    Neutrophils Relative % 79 (*)    All other components within normal limits  COMPREHENSIVE METABOLIC PANEL - Abnormal; Notable for the following:    BUN 47 (*)    Creatinine, Ser 1.93 (*)    Albumin 3.2 (*)    GFR calc non Af Amer 31 (*)    GFR calc Af Amer 36 (*)    All other components within normal limits  TROPONIN I  GLUCOSE, CAPILLARY  URINALYSIS, ROUTINE W REFLEX MICROSCOPIC  OCCULT BLOOD, POC DEVICE   Dg Chest 2 View  07/01/2012   *RADIOLOGY REPORT*  Clinical Data: Syncope, fall, hypoglycemia, history hypertension, diabetes, coronary artery disease, CHF, COPD, smoking  CHEST - 2 VIEW  Comparison: 02/27/2012  Findings: Right subclavian sequential transvenous pacemaker leads project at right atrium and right ventricle. Eventration of the  posterior diaphragms bilaterally. Upper normal heart size post CABG. Mediastinal contours and pulmonary vascularity normal. Atherosclerotic calcification aortic arch. No gross infiltrate, pleural effusion or pneumothorax. Bones unremarkable.  IMPRESSION: No acute abnormalities.   Original Report Authenticated By: Lavonia Dana, M.D.  Ct Head Wo Contrast  07/01/2012   *RADIOLOGY REPORT*  Clinical Data: Syncope.  Fall, hypoglycemia.  CT HEAD WITHOUT CONTRAST  Technique:  Contiguous axial images were obtained from the base of the skull through the vertex without contrast.  Comparison: 02/27/2012  Findings: There is no evidence for acute infarction, intracranial hemorrhage, mass lesion, hydrocephalus, or extra-axial fluid. Moderate cerebral and cerebellar atrophy is age related.  Chronic microvascular ischemic change involves the periventricular and subcortical white matter.  Remote lacunar infarct right external capsule.  Calvarium intact. There is a moderate degree of proximal vascular calcification.  In the left greater wing sphenoid, there is a mixed sclerotic and lytic appearance with slight expansion of the bone. This obliterates most of the sphenoid sinus.  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  IMPRESSION: Atrophy and chronic microvascular ischemic change.  No acute intracranial findings.  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   Original Report Authenticated By: Rolla Flatten, M.D.     1. Syncope   2. Hypothermia, initial encounter      Date: 07/01/2012  Rate: 60  Rhythm: normal sinus rhythm  QRS Axis: normal  Intervals: normal  ST/T Wave abnormalities: normal  Conduction Disutrbances:none  Narrative Interpretation:   Old EKG Reviewed: unchanged    MDM   79 year old male presents with syncope. Patient's blood glucose 54 upon  EMS arrival. He took his insulin this morning, his normal 18 units of 70/30, and then he did not eat. He went to a cardiology appointment, where his check-up was normal. He then went home, found the bed and felt dizzy, and then laid back. He woke up with EMS at his home. There was no report from him about being in the bathtub. Here, he is hypertensive. His sugar is normal. He is hypothermic at 74F rectal temp. Lungs are clear. With his syncope and hypothermia, will check head CT, chest x-ray, labs looking for possible infection. Head CT normal, CXR normal. Labs normal. EKG normal. Will admit for syncope and hypothermia.        Osvaldo Shipper, MD 07/01/12 [phone]

## 2012-07-01 NOTE — Progress Notes (Signed)
Patient transfer to 24, report given to [facility], last CBG 182. Left the floor at 2202.   Carlena Sax

## 2012-07-01 NOTE — Progress Notes (Signed)
Pt arrived on floor alert and oriented.  Pt vital signs were taken upon arriving on unit, BP elevated; MD made aware.

## 2012-07-01 NOTE — ED Notes (Signed)
MD Short at bedside.

## 2012-07-01 NOTE — Patient Instructions (Addendum)
Your physician recommends that you return for lab work in: TODAY BMET  Your physician has recommended you make the following change in your medication:  COREG/ CARVEDILOL DECREASE DOSE TO 6.25 MG TWICE DAILY 12 HOURS APART// PICK UP AT [facility]  Your physician wants you to follow-up in: 6 MONTHS  You will receive a reminder letter in the mail two months in advance. If you don't receive a letter, please call our office to schedule the follow-up appointment.

## 2012-07-01 NOTE — ED Notes (Signed)
Per EMS pt from home with c/o fall and hypoglycemia. Pt got up this am, took insulin, did not eat breakfast. Left to go to doctor, came home and fell in the bathtub. Unknown LOC. Initial CBG 54. Given 4 glucose tabs and peanut butter jelly sandwich and milk. Alert and oriented x 4. No blood thinners. No laceration. Moves all extremities. Denies pain.

## 2012-07-01 NOTE — H&P (Signed)
[facility] History and Physical  Colin [name] [id number] DOB: [birth year] DOA: 07/01/2012  Referring physician:  Murlean Caller PCP:  Horatio Pel, MD   Chief Complaint:  Fall, hypoglycemia, hypothermia  HPI:  The patient is a 79 y.o. year old male with history of coronary artery disease status post CABG in 2004, peripheral vascular disease, diabetes mellitus, congestive heart failure with ejection fraction of approximately 35%, pacemaker, a CVA, COPD, chronic kidney disease stage IV, who presents with fall, hypoglycemia, and and hypothermia.  The patient was last at their baseline health yesterday morning. Yesterday during the day he felt well, until yesterday afternoon when he became sleepy and fatigued.  He has had some nonproductive cough and wheeze for the last 24-48 hours.  He is also had some urinary frequency without dysuria. He has not noticed any new skin infection or had any nausea, vomiting, or diarrhea. He denies chest pain, increased lower extremity edema, orthopnea, or PND.  This morning, he took 18 units of 70/30 and ate breakfast.  He went into his cardiology appointment and when he returned home it was after lunchtime.  He states he was feeling somewhat weak on the way home without focal weakness, and when he arrived at his house he lay down on his bed to rest.  He vaguely remembers getting up to go to the bathroom and the next thing he remembers is his wife trying to wake him up and EMS arriving.  He was found collapsed in the bathtub. His initial fingerstick was 54 and he was given 4 glucose tablets, peanut butter & jelly sandwich, and milk.  Per his report he has had frequent hypoglycemia before lunch time and in the afternoon.  He is unsure what his last hemoglobin A1c was.  Stable dyspnea on exertion.  In the emergency department his initial vital signs were rectal temperature of 94.2, pulse 59, blood pressure 157/71. He was breathing well on room air. His labs  were noticeable for mild anemia with hemoglobin of 12.1 and stable chronic kidney disease. His initial troponin was negative and his EKG was paced rhythm without any ST segment elevations or depressions.  Head CT negative.    Review of Systems:  Denies fevers, chills, weight loss or gain, changes to hearing and vision.  Denies rhinorrhea, sinus congestion, sore throat.  Denies chest pain and palpitations.  + wheezing, cough.  Denies nausea, vomiting, constipation, diarrhea.  Denies dysuria but has had frequency, urgency, polyuria.  Denies hematemesis, blood in stools, melena, abnormal bruising or bleeding.  Denies lymphadenopathy.  Denies arthralgias, myalgias.  Denies skin rash or ulcer.  Denies lower extremity edema.  Denies focal numbness, weakness, slurred speech, confusion, facial droop.  Denies anxiety and depression.    Past Medical History  Diagnosis Date  . Coronary artery disease     Status post CABG in 2004  . Peripheral vascular disease     Status post right femoropopliteal bypass  . Dyslipidemia   . Diabetes mellitus with peripheral artery disease   . Hypertension   . Chronic renal insufficiency     creatinine around 2.4  . CHF (congestive heart failure) 2007    ef 38%  . History of CVA (cerebrovascular accident)   . Cardiomyopathy   . COPD (chronic obstructive pulmonary disease)   . History of anemia    Past Surgical History  Procedure Laterality Date  . Insert / replace / remove pacemaker  05/17/2005    st. jude dual chamber ddd mode   .  Coronary artery bypass graft  4 of 2004    4 vessel bypass  . Femoral bypass  july 2006    right with right great toe amputation  . Cardiac catheterization  04/29/2002    Dr. Glade Lloyd  . Esophagogastroduodenoscopy  07/21/2011    Procedure: ESOPHAGOGASTRODUODENOSCOPY (EGD);  Surgeon: Beryle Beams, MD;  Location: [facility];  Service: Endoscopy;  Laterality: N/A;   Social History:  reports that he quit smoking about 10 years ago. His  smoking use included Cigarettes. He has a 50 pack-year smoking history. He has never used smokeless tobacco. He reports that he does not drink alcohol or use illicit drugs.  Lives in a house.  Lives with his wive.  Drives.  Uses a cane for ambulation.   Allergies  Allergen Reactions  . Lisinopril     Elevated potassium,renal insuf    Family History  Problem Relation Age of Onset  . Hyperlipidemia Mother   . Heart disease Father   . Diabetes       Prior to Admission medications   Medication Sig Start Date End Date Taking? Authorizing Provider  aspirin EC 81 MG EC tablet Take 1 tablet (81 mg total) by mouth daily. 01/11/12   Marianne L York, PA-C  B Complex-C (B-COMPLEX WITH VITAMIN C) tablet Take 1 tablet by mouth daily.    Historical Provider, MD  carvedilol (COREG) 6.25 MG tablet Take 1 tablet (6.25 mg total) by mouth 2 (two) times daily with a meal. 07/01/12   Thayer Headings, MD  Cyanocobalamin (VITAMIN B 12 PO) Take by mouth. 1000 mcg daily     Historical Provider, MD  ezetimibe (ZETIA) 10 MG tablet Take 10 mg by mouth daily.      Historical Provider, MD  glucose-Vitamin C 4-0.006 GM CHEW Chew 4 tablets by mouth as needed (CBG less than 70 or CBG greater than 70 and next meal more than 1 hour away). 03/01/12   Geradine Girt, DO  insulin aspart protamine-insulin aspart (NOVOLOG 70/30) (70-30) 100 UNIT/ML injection Inject 18 Units into the skin 2 (two) times daily with a meal. 03/01/12   Geradine Girt, DO  NEEDLE, DISP, 18 G 18G X 1-1/2" MISC 1 Container by Does not apply route 2 (two) times daily with a meal. 01/11/12   Marianne L York, PA-C  NOVOLIN 70/30 (70-30) 100 UNIT/ML injection as directed. 05/17/12   Historical Provider, MD  Omega-3 Fatty Acids (FISH OIL) 1000 MG CAPS Take 1,000 mg by mouth daily.     Historical Provider, MD  omeprazole (PRILOSEC) 40 MG capsule Take 40 mg by mouth daily as needed. Acid indigestion    Historical Provider, MD   Physical Exam: Filed Vitals:    07/01/12 1445 07/01/12 1600 07/01/12 1632 07/01/12 1645  BP: 189/68 169/71  158/65  Pulse: 60 60  70  Temp:   94.9 F (34.9 C)   TempSrc:   Rectal   Resp: 12 11  15   SpO2: 98% 98%  99%     General:  African American male, no acute distress, wearing Bear hugger  Eyes:  PERRL, anicteric, non-injected.  ENT:  Nares clear.  OP clear, non-erythematous without plaques or exudates.  MMM.  Neck:  Supple without TM or JVD.    Lymph:  No cervical, supraclavicular, or submandibular LAD.  Cardiovascular:  RRR, normal S1, S2, without m/r/g.  2+ radial pulses.  Absent pedal pulses.  Extremities starting to warm.   Respiratory:  CTA bilaterally without  increased WOB.  Abdomen:  NABS.  Soft, ND/NT.    Skin:  No rashes or focal lesions.  Musculoskeletal:  Decreased muscle tone and bulk of the left hand, right forefoot amputation.  No LE edema.  Psychiatric:  A & O x 4.  Appropriate affect.  Neurologic:  CN 3-12 intact.  5/5 strength.  Sensation intact.  Labs on Admission:  Basic Metabolic Panel:  Recent Labs Lab 07/01/12 1031 07/01/12 1430  NA 142 141  K 4.1 4.1  CL 113* 109  CO2 27 24  GLUCOSE 53* 81  BUN 45* 47*  CREATININE 2.0* 1.93*  CALCIUM 9.3 9.8   Liver Function Tests:  Recent Labs Lab 07/01/12 1430  AST 36  ALT 25  ALKPHOS 81  BILITOT 0.3  PROT 7.5  ALBUMIN 3.2*   No results found for this basename: LIPASE, AMYLASE,  in the last 168 hours No results found for this basename: AMMONIA,  in the last 168 hours CBC:  Recent Labs Lab 07/01/12 1430  WBC 6.9  NEUTROABS 5.4  HGB 12.1*  HCT 39.1  MCV 86.1  PLT 177   Cardiac Enzymes:  Recent Labs Lab 07/01/12 1430  TROPONINI <0.30    BNP (last 3 results)  Recent Labs  02/27/12 1725  PROBNP 488.1*   CBG:  Recent Labs Lab 07/01/12 1414 07/01/12 1615  GLUCAP 92 160*    Radiological Exams on Admission: Dg Chest 2 View  07/01/2012   *RADIOLOGY REPORT*  Clinical Data: Syncope, fall,  hypoglycemia, history hypertension, diabetes, coronary artery disease, CHF, COPD, smoking  CHEST - 2 VIEW  Comparison: 02/27/2012  Findings: Right subclavian sequential transvenous pacemaker leads project at right atrium and right ventricle. Eventration of the posterior diaphragms bilaterally. Upper normal heart size post CABG. Mediastinal contours and pulmonary vascularity normal. Atherosclerotic calcification aortic arch. No gross infiltrate, pleural effusion or pneumothorax. Bones unremarkable.  IMPRESSION: No acute abnormalities.   Original Report Authenticated By: Lavonia Dana, M.D.   Ct Head Wo Contrast  07/01/2012   *RADIOLOGY REPORT*  Clinical Data: Syncope.  Fall, hypoglycemia.  CT HEAD WITHOUT CONTRAST  Technique:  Contiguous axial images were obtained from the base of the skull through the vertex without contrast.  Comparison: 02/27/2012  Findings: There is no evidence for acute infarction, intracranial hemorrhage, mass lesion, hydrocephalus, or extra-axial fluid. Moderate cerebral and cerebellar atrophy is age related.  Chronic microvascular ischemic change involves the periventricular and subcortical white matter.  Remote lacunar infarct right external capsule.  Calvarium intact. There is a moderate degree of proximal vascular calcification.  In the left greater wing sphenoid, there is a mixed sclerotic and lytic appearance with slight expansion of the bone. This obliterates most of the sphenoid sinus.  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  IMPRESSION: Atrophy and chronic microvascular ischemic change.  No acute intracranial findings.  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   Original Report Authenticated By: Rolla Flatten, M.D.    EKG: Independently reviewed.  Paced rhythm without ST segment abnormalities  Assessment/Plan Active  Problems:   CORONARY ARTERY DISEASE   Cardiac pacemaker in situ   Chronic left-sided CHF (congestive heart failure)   Peripheral vascular disease   History of CVA (cerebrovascular accident)   COPD (chronic obstructive pulmonary disease)   CKD (chronic kidney disease), stage III   Diabetes mellitus with  peripheral artery disease   Type 2 diabetes mellitus with hyperosmolar nonketotic hyperglycemia   Fall   Hypoglycemia   Hypothermia  Hypoglycemia likely due to using insulin and not needing his regular meals, however must exclude sepsis or underlying infection. The patient has had some symptoms of cough and urinary frequency. -  Encourage meals -  Check fingersticks q. a.c. at bedtime and at 3 AM -  Low dose sliding scale insulin -  Chest x-ray negative -  Urinalysis is pending -  procalcitonin  Hypothermia, likely due to hypoglycemia, but again must rule out underlying infection.  Rule out hypothyroidism and adrenal insufficiency. -  See above -  TSH and a.m. cortisol level  Cough, without wheeze and chest x-ray is negative.   -  Check proBNP -  Duo nebs when necessary  Urinary frequency, may be due to urinary tract infection and or BPH -  Check postvoid residual -  Followup urinalysis  Fall in bathroom was likely due to hypoglycemia.  Head CT was negative for acute bleed. -  PT evaluation -  Telemetry -  Troponin neg x 1.  Will check one more 6 hours after initial troponin, then stop    CORONARY ARTERY DISEASE, stable on aspirin, beta blocker, and zetia.  Not on ACE inhibitor secondary to chronic kidney disease and hyperkalemia.   Cardiac pacemaker in situ, stable.     Chronic left-sided CHF (congestive heart failure), appears euvolemic.  Continue beta blocker   Peripheral vascular disease, stable, continue aspirin   History of CVA (cerebrovascular accident), consider increasing to full dose aspirin for secondary prevention   COPD (chronic obstructive pulmonary disease),  not currently wheezing.  duonebs prn   CKD (chronic kidney disease), stage III, stable.  Minimize nephrotoxins and renally dose medications   Diabetes mellitus with peripheral artery disease.  Due to hypoglycemia, hold home insulin and start low dose SSI  Diet:  diabetic Access:  PIV IVF:  off Proph:  lovenox  Code Status: full Family Communication: spoke with patient alone, his wife is his primary emergency contact Disposition Plan: Pending temperature and blood sugar stable.  Observation in stepdown  Time spent: 60 min  Janece Canterbury [facility] Pager [phone]  If 7PM-7AM, please contact night-coverage [email or web address] Password [facility] 07/01/2012, 5:33 PM

## 2012-07-01 NOTE — Assessment & Plan Note (Signed)
He denies any episodes of chest pain.

## 2012-07-01 NOTE — Assessment & Plan Note (Signed)
Check fasting lipids when I see him again in 6 months.

## 2012-07-01 NOTE — Progress Notes (Signed)
Otero Date of Birth  03/18/1928       [facility] [address], [place], [place]  [address]   [place], [place]  [address] [phone]     [phone]   Fax  [phone]    Fax [phone]  Problem List: 1. Coronary artery disease-status post CABG 2004 2. Congestive heart failure-EF of 35% 3. Peripheral vascular disease 4. Dyslipidemia 5. Diabetes mellitus 6. Hypertension 10. Pacemaker 8. Chronic renal insufficiency with baseline creatinine around 2.4  History of Present Illness:  Colin [name] is doing well. He still has some chest pain that is related to positional changes. He does not have any angina pain. He's breathing is overall very good.  He seems to be stable from a cardiac standpoint.  He avoids salt.  July 01, 2012:  He is doing well.  No CP or dyspnea.  Current Outpatient Prescriptions on File Prior to Visit  Medication Sig Dispense Refill  . aspirin EC 81 MG EC tablet Take 1 tablet (81 mg total) by mouth daily.      . B Complex-C (B-COMPLEX WITH VITAMIN C) tablet Take 1 tablet by mouth daily.      . carvedilol (COREG) 25 MG tablet Take 12.5 mg by mouth daily.       . Cyanocobalamin (VITAMIN B 12 PO) Take by mouth. 1000 mcg daily       . ezetimibe (ZETIA) 10 MG tablet Take 10 mg by mouth daily.        Marland Kitchen glucose-Vitamin C 4-0.006 GM CHEW Chew 4 tablets by mouth as needed (CBG less than 70 or CBG greater than 70 and next meal more than 1 hour away).  90 tablet  1  . insulin aspart protamine-insulin aspart (NOVOLOG 70/30) (70-30) 100 UNIT/ML injection Inject 18 Units into the skin 2 (two) times daily with a meal.  10 mL  0  . NEEDLE, DISP, 18 G 18G X 1-1/2" MISC 1 Container by Does not apply route 2 (two) times daily with a meal.  100 each  3  . Omega-3 Fatty Acids (FISH OIL) 1000 MG CAPS Take 1,000 mg by mouth daily.       Marland Kitchen omeprazole (PRILOSEC) 40 MG capsule Take 40 mg by mouth daily  as needed. Acid indigestion       No current facility-administered medications on file prior to visit.    Allergies  Allergen Reactions  . Lisinopril     Elevated potassium,renal insuf    Past Medical History  Diagnosis Date  . Coronary artery disease     Status post CABG in 2004  . Peripheral vascular disease     Status post right femoropopliteal bypass  . Dyslipidemia   . Diabetes mellitus with peripheral artery disease   . Hypertension   . Chronic renal insufficiency     creatinine around 2.4  . CHF (congestive heart failure) 2007    ef 38%  . History of CVA (cerebrovascular accident)   . Cardiomyopathy   . COPD (chronic obstructive pulmonary disease)   . History of anemia     Past Surgical History  Procedure Laterality Date  . Insert / replace / remove pacemaker  05/17/2005    st. jude dual chamber ddd mode   . Coronary artery bypass graft  4 of 2004    4 vessel bypass  . Femoral bypass  july 2006    right  with right great toe amputation  . Cardiac catheterization  04/29/2002    Dr. Glade Lloyd  . Esophagogastroduodenoscopy  07/21/2011    Procedure: ESOPHAGOGASTRODUODENOSCOPY (EGD);  Surgeon: Beryle Beams, MD;  Location: [facility];  Service: Endoscopy;  Laterality: N/A;    History  Smoking status  . Former Smoker  . Quit date: 06/09/2002  Smokeless tobacco  . Never Used    History  Alcohol Use No    Family History  Problem Relation Age of Onset  . Hyperlipidemia Mother   . Heart disease Father     Reviw of Systems:  Reviewed in the HPI.  All other systems are negative.  Physical Exam: Blood pressure 157/71, pulse 59, height 5\' 11"  (1.803 m), weight 195 lb (88.451 kg). General: Well developed, well nourished, in no acute distress.  Head: Normocephalic, atraumatic, sclera non-icteric, mucus membranes are moist,   Neck: Supple. Carotids are 2 + without bruits. No JVD  Lungs: Clear bilaterally to auscultation.  Heart: regular rate.  normal  S1  S2. No murmurs, gallops or rubs.  Abdomen: Soft, non-tender, non-distended with normal bowel sounds. No hepatomegaly. No rebound/guarding. No masses.  Msk:  Strength and tone are normal  Extremities: No clubbing or cyanosis. No edema.  Distal pedal pulses are 2+ and equal bilaterally.  Neuro: Alert and oriented X 3. Moves all extremities spontaneously.  Psych:  Responds to questions appropriately with a normal affect.  ECG:    Assessment / Plan:

## 2012-07-02 DIAGNOSIS — Z5189 Encounter for other specified aftercare: Secondary | ICD-10-CM

## 2012-07-02 DIAGNOSIS — I501 Left ventricular failure: Secondary | ICD-10-CM

## 2012-07-02 DIAGNOSIS — W19XXXA Unspecified fall, initial encounter: Secondary | ICD-10-CM

## 2012-07-02 LAB — GLUCOSE, CAPILLARY: Glucose-Capillary: 94 mg/dL (ref 70–99)

## 2012-07-02 MED ORDER — LEVOFLOXACIN 250 MG PO TABS: 250.0000 mg | ORAL_TABLET | Freq: Every day | ORAL | Status: DC

## 2012-07-02 MED ORDER — FUROSEMIDE 40 MG PO TABS: 40.0000 mg | ORAL_TABLET | Freq: Every day | ORAL | Status: DC | PRN

## 2012-07-02 MED ORDER — GLUCOSE 4 G PO CHEW: 16.0000 g | CHEWABLE_TABLET | ORAL | Status: DC | PRN

## 2012-07-02 MED ORDER — HYDRALAZINE HCL 25 MG PO TABS: 25.0000 mg | ORAL_TABLET | Freq: Three times a day (TID) | ORAL | Status: DC

## 2012-07-02 MED ORDER — ASPIRIN 81 MG PO TBEC: 325.0000 mg | DELAYED_RELEASE_TABLET | Freq: Every day | ORAL | Status: DC

## 2012-07-02 MED ORDER — ISOSORBIDE MONONITRATE 15 MG HALF TABLET: 15.0000 mg | ORAL_TABLET | Freq: Every day | ORAL | Status: DC

## 2012-07-02 MED ORDER — ISOSORBIDE MONONITRATE 15 MG HALF TABLET
15.0000 mg | ORAL_TABLET | Freq: Every day | ORAL | Status: DC
  Filled 2012-07-02 (×2): qty 1

## 2012-07-02 MED ORDER — DEXTROSE 5 % IV SOLN
1.0000 g | INTRAVENOUS | Status: DC
  Administered 2012-07-02: 1 g via INTRAVENOUS
  Filled 2012-07-02 (×3): qty 10

## 2012-07-02 MED ORDER — INSULIN NPH ISOPHANE & REGULAR (70-30) 100 UNIT/ML ~~LOC~~ SUSP: 12.0000 [IU] | Freq: Two times a day (BID) | SUBCUTANEOUS | Status: DC

## 2012-07-02 NOTE — Discharge Summary (Signed)
Physician Discharge Summary  Colin [name] [id number] DOB: [birth year] DOA: 07/01/2012  PCP: Horatio Pel, MD  Admit date: 07/01/2012 Discharge date: 07/02/2012  Recommendations for Outpatient Follow-up:  1. Followup with primary care doctor within one week of discharge to review fingersticks and blood pressures 2.  followup with his primary cardiologist as an outpatient as indicated by Dr. Acie Fredrickson at appointment on 6/9.  Discharge Diagnoses:  Active Problems:   CORONARY ARTERY DISEASE   Cardiac pacemaker in situ   Chronic left-sided CHF (congestive heart failure)   Peripheral vascular disease   History of CVA (cerebrovascular accident)   COPD (chronic obstructive pulmonary disease)   CKD (chronic kidney disease), stage III   Diabetes mellitus with peripheral artery disease   Type 2 diabetes mellitus with hyperosmolar nonketotic hyperglycemia   Fall   Hypoglycemia   Hypothermia   Discharge Condition: Stable, improved  Diet recommendation: Diabetic  Wt Readings from Last 3 Encounters:  07/02/12 87.3 kg (192 lb 7.4 oz)  07/01/12 88.451 kg (195 lb)  03/01/12 83.2 kg (183 lb 6.8 oz)    History of present illness:   The patient is a 79 y.o. year old male with history of coronary artery disease status post CABG in 2004, peripheral vascular disease, diabetes mellitus, congestive heart failure with ejection fraction of approximately 35%, pacemaker, a CVA, COPD, chronic kidney disease stage IV, who presents with fall, hypoglycemia, and and hypothermia. The patient was last at their baseline health yesterday morning. Yesterday during the day he felt well, until yesterday afternoon when he became sleepy and fatigued. He has had some nonproductive cough and wheeze for the last 24-48 hours. He is also had some urinary frequency without dysuria. He has not noticed any new skin infection or had any nausea, vomiting, or diarrhea. He denies chest pain, increased lower extremity edema,  orthopnea, or PND. This morning, he took 18 units of 70/30 and ate breakfast. He went into his cardiology appointment and when he returned home it was after lunchtime. He states he was feeling somewhat weak on the way home without focal weakness, and when he arrived at his house he lay down on his bed to rest. He vaguely remembers getting up to go to the bathroom and the next thing he remembers is his wife trying to wake him up and EMS arriving. He was found collapsed in the bathtub. His initial fingerstick was 54 and he was given 4 glucose tablets, peanut butter & jelly sandwich, and milk. Per his report he has had frequent hypoglycemia before lunch time and in the afternoon. He is unsure what his last hemoglobin A1c was. Stable dyspnea on exertion.  In the emergency department his initial vital signs were rectal temperature of 94.2, pulse 59, blood pressure 157/71. He was breathing well on room air. His labs were noticeable for mild anemia with hemoglobin of 12.1 and stable chronic kidney disease. His initial troponin was negative and his EKG was paced rhythm without any ST segment elevations or depressions. Head CT negative.    Hospital Course:   Hypoglycemia likely due to using insulin and not needing his regular meals.  His chest x-ray was negative however his urinalysis was consistent with urinary tract infection with large leukocyte esterase and too numerous to count WBCs, particularly in the setting of urinary frequency and urgency for the last 2 days.  He was started on sliding scale insulin and was encouraged to eat meals. His fingersticks rose and ranged from 94-202.  The patient  met with the diabetic educator.  2 options were discussed with the patient, the first was to reduce his current insulin to 12 units twice a day. The second was to convert over to levemir 17 units qhs and tradjenta 5 mg daily in the morning.  The patient seems to have primary care followup, so I will just reduce his existing  insulin. If the primary care doctor would like to convert him over to an alternative regimen, they can do so as an outpatient.  The patient states that he already eats a close log of his fingersticks. He should bring that with him to his followup appointment. I will give him instructions about hypoglycemia and a prescription for some additional glucose tablets.    Hypothermia, likely due to hypoglycemia, but may also have a component of urinary tract infection. His TSH was 1.676. Morning cortisol level is pending at the time of discharge, however his blood pressures have been stable suggesting this is not the most likely etiology.  He was given a bearhugger and his temperature rose to normal and remained stable afterwards.    Cough, without wheeze and chest x-ray is negative. proBNP was elevated.  He was given duonebs prn.    Urinary frequency, likely due to urinary tract infection.  He should complete a 7 day course of antibiotics for UTI.    Fall in bathroom was likely due to hypoglycemia. Head CT was negative for acute bleed.  His telemetry demonstrated no arrhythmias. His troponins were negative. He was seen by physical therapy who recommended no further followup.  CORONARY ARTERY DISEASE, stable on aspirin, beta blocker, and zetia. Not on ACE inhibitor secondary to chronic kidney disease and hyperkalemia.  Cardiac pacemaker in situ, stable.  Chronic systolic CHF with EF of 99991111 and grade 1 diastolic heart failure, appears euvolemic however proBNP was elevated. Continue beta blocker.  He was given a prescription for  Lasix to use if he gains more than 3 pounds in one day or 5 pounds in one week, or if he notices swelling of his ankles or legs or increased shortness of breath. Hypertension, elevated blood pressures -  Continue carvedilol 6.25 mg twice a day, recently reduced by his cardiologist -  Started on hydralazine and Imdur -  Follow up with primary care doctor in one to 2 weeks for repeat  blood pressure and heart rate check Peripheral vascular disease, stable, continue aspirin  History of CVA (cerebrovascular accident), increased to full dose aspirin for secondary prevention of stroke.   COPD (chronic obstructive pulmonary disease), not currently wheezing. duonebs prn  CKD (chronic kidney disease), stage III, stable. Minimize nephrotoxins and renally dose medications  Diabetes mellitus with peripheral artery disease. Due to hypoglycemia, hold home insulin and start low dose SSI  Procedures:  None  Consultations:  PT  Diabetes educator  Discharge Exam: Filed Vitals:   07/02/12 0952  BP: 180/64  Pulse: 59  Temp:   Resp:    Filed Vitals:   07/01/12 2221 07/02/12 0146 07/02/12 0545 07/02/12 0952  BP: 166/69 174/72 142/71 180/64  Pulse: 64 68 71 59  Temp: 98.4 F (36.9 C) 98.2 F (36.8 C) 98.1 F (36.7 C)   TempSrc: Oral Oral Oral   Resp: 20 20 20    Height:      Weight: 86.8 kg (191 lb 5.8 oz)  87.3 kg (192 lb 7.4 oz)   SpO2: 97% 96% 99% 99%   The patient states that he feels back to his  baseline.  He denies lightheadedness, shortness of breath, nausea, vomiting. He denies lower extremity edema.    General:  African American male, no acute distress, sitting up in chair, well-appearing HEENT: Normocephalic atraumatic, moist mucous membranes Neck: No JVP  Cardiovascular:  regular rate and rhythm, no murmurs, rubs, or gallops. 2+ radial pulses  Respiratory:  clear to auscultation bilaterally without increased work of breathing Abdomen:  NABS. Soft, nondistended, nontender Skin: No signs of rash or cellulitis MSK:  No lower extremity edema Neurologic: Grossly intact   Discharge Instructions      Discharge Orders   Future Orders Complete By Expires     (HEART FAILURE PATIENTS) Call MD:  Anytime you have any of the following symptoms: 1) 3 pound weight gain in 24 hours or 5 pounds in 1 week 2) shortness of breath, with or without a dry hacking cough 3)  swelling in the hands, feet or stomach 4) if you have to sleep on extra pillows at night in order to breathe.  As directed     Call MD for:  difficulty breathing, headache or visual disturbances  As directed     Call MD for:  extreme fatigue  As directed     Call MD for:  hives  As directed     Call MD for:  persistant dizziness or light-headedness  As directed     Call MD for:  persistant nausea and vomiting  As directed     Call MD for:  severe uncontrolled pain  As directed     Call MD for:  temperature >100.4  As directed     Diet - low sodium heart healthy  As directed     Diet Carb Modified  As directed     Discharge instructions  As directed     Comments:      You were hospitalized with low blood sugar, low body temperature, high blood pressure, and a fall.  Please reduce your home insulin to 12 units twice a day, and record your fingersticks as before in your book. Please bring up with you to your follow up appointment with your primary care doctor in one week.  I have given you a prescription for some glucose tabs, which you can use if you are out and noticed that you were having a low fingerstick.  Please talk to your primary care doctor about glucagon, which is an injectable medication that can be used even if you are unable to drink or eat to bring up your fingersticks.  For your high blood pressure, particularly in the setting of heart failure, I started you on 2 new medications. The first is hydralazine and the second is imdur.  Your blood pressures have remained quite high even with the addition of these medications, so please make sure your primary care doctor checks her blood pressure at your followup appointment and increases your medications as needed.  Because you have a history of a near stroke, you should probably be on a full dose aspirin 325 mg per day.  Finally, your blood tests for heart failure was somewhat elevated. You had been on Lasix in the past which was discontinued  because of your kidney function, however I will give you a new prescription to use as needed. Please take a dose of Lasix if you notice swelling of your feet, wheezing or shortness of breath particularly when lying flat, or weight gain more than 3 pounds in one day or 5 pounds in one  week. If you have to use the Lasix, please call your cardiologist.    Increase activity slowly  As directed         Medication List    TAKE these medications       aspirin 81 MG EC tablet  Take 4 tablets (325 mg total) by mouth daily.     B-complex with vitamin C tablet  Take 1 tablet by mouth daily.     carvedilol 6.25 MG tablet  Commonly known as:  COREG  Take 1 tablet (6.25 mg total) by mouth 2 (two) times daily with a meal.     ezetimibe 10 MG tablet  Commonly known as:  ZETIA  Take 10 mg by mouth daily.     Fish Oil 1000 MG Caps  Take 1,000 mg by mouth daily.     furosemide 40 MG tablet  Commonly known as:  LASIX  Take 1 tablet (40 mg total) by mouth daily as needed (Shortness of breath, lower extremity edema, weight gain of 3 pounds in one day or 5 pounds in one week).     glucose 4 GM chewable tablet  Chew 4 tablets (16 g total) by mouth as needed for low blood sugar.     hydrALAZINE 25 MG tablet  Commonly known as:  APRESOLINE  Take 1 tablet (25 mg total) by mouth every 8 (eight) hours.     insulin NPH-regular (70-30) 100 UNIT/ML injection  Commonly known as:  NOVOLIN 70/30  Inject 12 Units into the skin 2 (two) times daily with a meal.     isosorbide mononitrate 15 mg Tb24  Commonly known as:  IMDUR  Take 0.5 tablets (15 mg total) by mouth daily.     levofloxacin 250 MG tablet  Commonly known as:  LEVAQUIN  Take 1 tablet (250 mg total) by mouth daily.     NEEDLE (DISP) 18 G 18G X 1-1/2" Misc  1 Container by Does not apply route 2 (two) times daily with a meal.     omeprazole 40 MG capsule  Commonly known as:  PRILOSEC  Take 40 mg by mouth daily.     sodium polystyrene 15  GM/60ML suspension  Commonly known as:  KAYEXALATE  Take 15 g by mouth 3 (three) times a week.       Follow-up Information   Follow up with Horatio Pel, MD. Schedule an appointment as soon as possible for a visit in 1 week.   Contact information:   [address] [place] [address] [phone]       Follow up with Darden Amber., MD.   Contact information:   [place] [address] [phone]        The results of significant diagnostics from this hospitalization (including imaging, microbiology, ancillary and laboratory) are listed below for reference.    Significant Diagnostic Studies: Dg Chest 2 View  07/01/2012   *RADIOLOGY REPORT*  Clinical Data: Syncope, fall, hypoglycemia, history hypertension, diabetes, coronary artery disease, CHF, COPD, smoking  CHEST - 2 VIEW  Comparison: 02/27/2012  Findings: Right subclavian sequential transvenous pacemaker leads project at right atrium and right ventricle. Eventration of the posterior diaphragms bilaterally. Upper normal heart size post CABG. Mediastinal contours and pulmonary vascularity normal. Atherosclerotic calcification aortic arch. No gross infiltrate, pleural effusion or pneumothorax. Bones unremarkable.  IMPRESSION: No acute abnormalities.   Original Report Authenticated By: Lavonia Dana, M.D.   Ct Head Wo Contrast  07/01/2012   *  RADIOLOGY REPORT*  Clinical Data: Syncope.  Fall, hypoglycemia.  CT HEAD WITHOUT CONTRAST  Technique:  Contiguous axial images were obtained from the base of the skull through the vertex without contrast.  Comparison: 02/27/2012  Findings: There is no evidence for acute infarction, intracranial hemorrhage, mass lesion, hydrocephalus, or extra-axial fluid. Moderate cerebral and cerebellar atrophy is age related.  Chronic microvascular ischemic change involves the periventricular and subcortical white matter.  Remote lacunar infarct right external  capsule.  Calvarium intact. There is a moderate degree of proximal vascular calcification.  In the left greater wing sphenoid, there is a mixed sclerotic and lytic appearance with slight expansion of the bone. This obliterates most of the sphenoid sinus.  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  IMPRESSION: Atrophy and chronic microvascular ischemic change.  No acute intracranial findings.  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   Original Report Authenticated By: Rolla Flatten, M.D.    Microbiology: Recent Results (from the past 240 hour(s))  MRSA PCR SCREENING     Status: None   Collection Time    07/01/12  6:04 PM      Result Value Range Status   MRSA by PCR NEGATIVE  NEGATIVE Final   Comment:            The GeneXpert MRSA Assay (FDA     approved for NASAL specimens     only), is one component of a     comprehensive MRSA colonization     surveillance program. It is not     intended to diagnose MRSA     infection nor to guide or     monitor treatment for     MRSA infections.     Labs: Basic Metabolic Panel:  Recent Labs Lab 07/01/12 1031 07/01/12 1430 07/01/12 1822  NA 142 141  --   K 4.1 4.1  --   CL 113* 109  --   CO2 27 24  --   GLUCOSE 53* 81  --   BUN 45* 47*  --   CREATININE 2.0* 1.93* 1.81*  CALCIUM 9.3 9.8  --    Liver Function Tests:  Recent Labs Lab 07/01/12 1430  AST 36  ALT 25  ALKPHOS 81  BILITOT 0.3  PROT 7.5  ALBUMIN 3.2*   No results found for this basename: LIPASE, AMYLASE,  in the last 168 hours No results found for this basename: AMMONIA,  in the last 168 hours CBC:  Recent Labs Lab 07/01/12 1430  WBC 6.9  NEUTROABS 5.4  HGB 12.1*  HCT 39.1  MCV 86.1  PLT 177   Cardiac Enzymes:  Recent Labs Lab 07/01/12 1430 07/01/12 2038  TROPONINI <0.30 <0.30   BNP: BNP (last  3 results)  Recent Labs  02/27/12 1725 07/01/12 2038  PROBNP 488.1* 1948.0*   CBG:  Recent Labs Lab 07/01/12 1745 07/01/12 2142 07/02/12 0243 07/02/12 0635 07/02/12 1126  GLUCAP 202* 182* 94 178* 132*    Time coordinating discharge: 45 minutes  Signed:  Carianna Lague  [facility] 07/02/2012, 11:59 AM

## 2012-07-02 NOTE — Progress Notes (Signed)
Physical Therapy Discharge Patient Details Name: Colin [name] MRN: [id number] DOB: [birth year] Today's Date: 07/02/2012 Time: [id number] PT Time Calculation (min): 19 min  Patient discharged from PT services secondary to pt functioning at baseline with necessary assist at home.  Please see latest therapy progress note for current level of functioning and progress toward goals.    Progress and discharge plan discussed with patient and/or caregiver: Patient/Caregiver agrees with plan  GP    Donnald Garre, Student Physical Therapist Office #: [phone]  07/02/2012, 9:30 AM   Agree with above assessment.  Kittie Plater, PT, DPT Pager #: [phone] Office #: [phone]

## 2012-07-02 NOTE — Progress Notes (Signed)
Inpatient Diabetes Program Recommendations  AACE/ADA: New Consensus Statement on Inpatient Glycemic Control (2013)  Target Ranges:  Prepandial:   less than 140 mg/dL      Peak postprandial:   less than 180 mg/dL (1-2 hours)      Critically ill patients:  140 - 180 mg/dL     Admitted with hypoglycemia.  Patient has history of CHF, COPD, CVA, CAD, and CKD stage 4.  Per H&P, patient states that he took 18 units of 70/30 insulin yesterday morning and ate breakfast as usual.  Martin Majestic to his cardiology MD appointment and when he came home in the afternoon he stated that he felt weak.  Experienced hypoglycemia at home and EMS was called.  Patient also stated that he has been having hypoglycemia frequently at home around lunch and early afternoon.  Noted patient has CKD.  A1c was 7.7% (07/01/12).  Based on patient's age and the fact that he has multiple chronic diseases, patient may fair better with less stringent glucose goals ("Less stringent A1C goals (such as, 8%) may be appropriate for patients with a history of severe hypoglycemia, limited life expectancy, advanced microvascular or macrovascular complications, and extensive comorbid conditions and in those with longstanding diabetes in whom the general goal is difficult to attain despite DSME, appropriate glucose monitoring, and effective doses of multiple glucose lowering agents including insulin."- 2014 ADA Standards of Care p. S23)   Results for Colin [name], Colin [name] (MRN [id number]) as of 07/02/2012 10:02  Ref. Range 07/01/2012 14:14 07/01/2012 16:15 07/01/2012 17:45 07/01/2012 21:42  Glucose-Capillary Latest Range: 70-99 mg/dL 92 160 (H) 202 (H) 182 (H)    Results for Colin [name], Colin [name] (MRN [id number]) as of 07/02/2012 10:02  Ref. Range 07/02/2012 02:43 07/02/2012 06:35  Glucose-Capillary Latest Range: 70-99 mg/dL 94 178 (H)    CBGs here in hospital have stabilized.  Was called by Dr. Sheran Fava for assistance of insulin adjustment for discharge for this patient.   Based on patient's current CBGs and his PMH, recommend the following adjustments to his insulin regimen based on his weight of 87.3 kg.  Can do one of the following:   If patient has financial constraints, would leave patient on 70/30 insulin but would reduce the dose to 12 units bid with breakfast and supper (87.3 kg X 0.2 units/kg for basal insulin dosing= 17 units basal insulin which be approximately equivalent to 12 units 70/30 insulin bid with meals)  OR  If patient does not have financial constraints, could switch patient to Levemir insulin 17 units QHS + Tradjenta 5 mg daily in the morning (Levemir insulin could cover patient's basal needs at 0.2 units/kg dosing and Tradgenta (which does not have to be renally dosed) could cover patient's postprandial excursions)  **Noted patient is followed by Dr. Deland Pretty at [facility].  Patient needs to follow up with Dr. Shelia Media soon after discharge to continue to assess glucose control at home.  Note, patient may be able to see one of the endocrinologists at [facility] practice (Dr. Chalmers Cater or Dr. Wilson Singer are both Endocrinologists at [facility])  Will follow. Wyn Quaker RN, MSN, CDE Diabetes Coordinator Inpatient Diabetes Program [phone]

## 2012-07-02 NOTE — Evaluation (Signed)
Physical Therapy Evaluation Patient Details Name: Colin [name] MRN: [id number] DOB: Aug 29, 1928 Today's Date: 07/02/2012 Time: [id number] PT Time Calculation (min): 19 min  PT Assessment / Plan / Recommendation Clinical Impression  Pt is an 79 yo male with DM who was admitted following a fall with hypoglycemia. Pt is mod I for bed mobility and require suprvision to min G for transfers. Pt's wife ia available 24/7 at home to provide supervision for OOB mobility with orthotic. Anticipate pt safe to d/c home without further PT services. Acute PT signing off at this time. Please re-consult as needed in the future.     PT Assessment  Patent does not need any further PT services    Follow Up Recommendations  Supervision for mobility/OOB    Does the patient have the potential to tolerate intense rehabilitation      Barriers to Discharge        Equipment Recommendations  None recommended by PT    Recommendations for Other Services     Frequency      Precautions / Restrictions Precautions Precautions: Fall Precaution Comments: Pt with hypoglycemia prior to admission causing him to fall backwards on the side of tub Required Braces or Orthoses:  (Pt has a special shoe for R LE, not present for eval) Restrictions Weight Bearing Restrictions: No   Pertinent Vitals/Pain Pt denies pain      Mobility  Bed Mobility Bed Mobility: Supine to Sit;Sitting - Scoot to Edge of Bed Supine to Sit: 6: Modified independent (Device/Increase time);HOB elevated (pt reports he sleeps on many pillows, increased time) Sitting - Scoot to Edge of Bed: 7: Independent Transfers Transfers: Sit to Stand;Stand to Sit Sit to Stand: 5: Supervision;From toilet;4: Min guard;From bed (from bed x1, from toilet x1) Stand to Sit: To chair/3-in-1;To toilet;4: Min guard;5: Supervision (to chair x1, to toilet x1) Details for Transfer Assistance: Pt required minG for transfers in bathroom due to unfamiliar environment  and toilet height Ambulation/Gait Ambulation/Gait Assistance: 5: Supervision Ambulation Distance (Feet): 150 Feet Assistive device: Straight cane Ambulation/Gait Assistance Details: Pt required supervision ambulating without R foot orthotic Gait Pattern: Step-through pattern;Lateral trunk lean to right Gait velocity: WFL for age General Gait Details: Pt demoes mild instability without R foot orthotic. Pt reports he does not ambulate without the orthotic at home    Exercises     PT Diagnosis:    PT Problem List:   PT Treatment Interventions:     PT Goals    Visit Information  Last PT Received On: 07/02/12 Assistance Needed: +1 PT/OT Co-Evaluation/Treatment: Yes    Subjective Data  Subjective: Pt recieved in bed, agreeable to PT. Pt reports he sat down and fell backwards but did not hit his head when he fell Patient Stated Goal: to go home   Prior [place] Lives With: Spouse Available Help at Discharge: Available 24 hours/day Type of Home: House Home Access: Level entry Home Layout: One level Bathroom Shower/Tub: Tub/shower unit;Curtain Biochemist, clinical: Standard Home Adaptive Equipment: Shower chair with back;Grab bars in shower;Straight cane;Walker - rolling;Bedside commode/3-in-1 (hemi walker) Prior Function Level of Independence: Independent with assistive device(s) Able to Take Stairs?: No Driving: Yes Vocation: Retired Corporate investment banker: No difficulties Dominant Hand: Right    Cognition  Cognition Arousal/Alertness: Awake/alert Behavior During Therapy: WFL for tasks assessed/performed Overall Cognitive Status: Within Functional Limits for tasks assessed    Extremity/Trunk Assessment Right Upper Extremity Assessment RUE ROM/Strength/Tone: Within functional levels RUE Sensation: WFL - Light Touch;WFL -  Proprioception Left Upper Extremity Assessment LUE ROM/Strength/Tone: Within functional levels LUE Sensation: WFL - Light Touch;WFL  - Proprioception Right Lower Extremity Assessment RLE ROM/Strength/Tone: WFL for tasks assessed RLE Sensation: WFL - Light Touch;WFL - Proprioception Left Lower Extremity Assessment LLE ROM/Strength/Tone: WFL for tasks assessed LLE Sensation: WFL - Light Touch;WFL - Proprioception   Balance    End of Session PT - End of Session Equipment Utilized During Treatment: Gait belt Activity Tolerance: Patient tolerated treatment well Patient left: in chair;with call bell/phone within reach  GP     07/02/2012, 9:28 AM Donnald Garre, Student Physical Therapist Office #: [phone]

## 2012-07-02 NOTE — Evaluation (Signed)
Physical Therapy Evaluation Patient Details Name: Colin [name] MRN: [id number] DOB: Jun 10, 1928 Today's Date: 07/02/2012 Time: [id number] PT Time Calculation (min): 19 min  PT Assessment / Plan / Recommendation Clinical Impression  Pt is an 80 yo male with DM who was admitted following a fall with hypoglycemia. Pt is mod I for bed mobility and require suprvision to min G for transfers. Pt's wife ia available 24/7 at home to provide supervision for OOB mobility with orthotic. Anticipate pt safe to d/c home without further PT services. Acute PT signing off at this time. Please re-consult as needed in the future.     PT Assessment  Patent does not need any further PT services    Follow Up Recommendations  Supervision for mobility/OOB    Does the patient have the potential to tolerate intense rehabilitation      Barriers to Discharge        Equipment Recommendations  None recommended by PT    Recommendations for Other Services     Frequency      Precautions / Restrictions Precautions Precautions: Fall Precaution Comments: Pt with hypoglycemia prior to admission causing him to fall backwards on the side of tub Required Braces or Orthoses:  (Pt has a special shoe for R LE, not present for eval) Restrictions Weight Bearing Restrictions: No   Pertinent Vitals/Pain Pt denies pain      Mobility  Bed Mobility Bed Mobility: Supine to Sit;Sitting - Scoot to Edge of Bed Supine to Sit: 6: Modified independent (Device/Increase time);HOB elevated (pt reports he sleeps on many pillows, increased time) Sitting - Scoot to Edge of Bed: 7: Independent Transfers Transfers: Sit to Stand;Stand to Sit Sit to Stand: 5: Supervision;From toilet;4: Min guard;From bed (from bed x1, from toilet x1) Stand to Sit: To chair/3-in-1;To toilet;4: Min guard;5: Supervision (to chair x1, to toilet x1) Details for Transfer Assistance: Pt required minG for transfers in bathroom due to unfamiliar environment  and toilet height Ambulation/Gait Ambulation/Gait Assistance: 5: Supervision Ambulation Distance (Feet): 150 Feet Assistive device: Straight cane Ambulation/Gait Assistance Details: Pt required supervision ambulating without R foot orthotic Gait Pattern: Step-through pattern;Lateral trunk lean to right Gait velocity: WFL for age General Gait Details: Pt demoes mild instability without R foot orthotic. Pt reports he does not ambulate without the orthotic at home    Exercises     PT Diagnosis:    PT Problem List:   PT Treatment Interventions:     PT Goals    Visit Information  Last PT Received On: 07/02/12 Assistance Needed: +1 PT/OT Co-Evaluation/Treatment: Yes    Subjective Data  Subjective: Pt recieved in bed, agreeable to PT. Pt reports he sat down and fell backwards but did not hit his head when he fell Patient Stated Goal: to go home   Prior [place] Lives With: Spouse Available Help at Discharge: Available 24 hours/day Type of Home: House Home Access: Level entry Home Layout: One level Bathroom Shower/Tub: Tub/shower unit;Curtain Biochemist, clinical: Standard Home Adaptive Equipment: Shower chair with back;Grab bars in shower;Straight cane;Walker - rolling;Bedside commode/3-in-1 (hemi walker) Prior Function Level of Independence: Independent with assistive device(s) Able to Take Stairs?: No Driving: Yes Vocation: Retired Corporate investment banker: No difficulties Dominant Hand: Right    Cognition  Cognition Arousal/Alertness: Awake/alert Behavior During Therapy: WFL for tasks assessed/performed Overall Cognitive Status: Within Functional Limits for tasks assessed    Extremity/Trunk Assessment Right Upper Extremity Assessment RUE ROM/Strength/Tone: Within functional levels RUE Sensation: WFL - Light Touch;WFL -  Proprioception Left Upper Extremity Assessment LUE ROM/Strength/Tone: Within functional levels LUE Sensation: WFL - Light Touch;WFL  - Proprioception Right Lower Extremity Assessment RLE ROM/Strength/Tone: WFL for tasks assessed RLE Sensation: WFL - Light Touch;WFL - Proprioception Left Lower Extremity Assessment LLE ROM/Strength/Tone: WFL for tasks assessed LLE Sensation: WFL - Light Touch;WFL - Proprioception   Balance    End of Session PT - End of Session Equipment Utilized During Treatment: Gait belt Activity Tolerance: Patient tolerated treatment well Patient left: in chair;with call bell/phone within reach  GP     07/02/2012, 9:28 AM Donnald Garre, Student Physical Therapist Office #: [phone]   Agree with above assessment.  Kittie Plater, PT, DPT Pager #: [phone] Office #: [phone]

## 2012-07-02 NOTE — Evaluation (Signed)
Occupational Therapy Evaluation and Discharge Patient Details Name: Colin [name] MRN: [id number] DOB: 1928-12-23 Today's Date: 07/02/2012 Time: [id number] OT Time Calculation (min): 20 min  OT Assessment / Plan / Recommendation Clinical Impression  This 79 yo male admitted with Fall, hypoglycemia, hypothermia presents to acute OT with all education completed. Will D/C from acute OT.    OT Assessment  Patient does not need any further OT services    Follow Up Recommendations  No OT follow up       Equipment Recommendations  None recommended by OT          Precautions / Restrictions Precautions Precautions: Fall Precaution Comments: Pt with hypoglycemia prior to admission causing him to fall backwards on the side of tub Required Braces or Orthoses:  (Pt has a special shoe for R LE, not present for eval) Restrictions Weight Bearing Restrictions: No       ADL  Transfers/Ambulation Related to ADLs: Pt at a S level with SPC and will have this at home ADL Comments: Pt at a S level for BADLs and will have this at home        Visit Information  Last OT Received On: 07/02/12 Assistance Needed: +1 PT/OT Co-Evaluation/Treatment: Yes    Subjective Data  Subjective: I feel like I am ready to go home   Prior [place] Lives With: Spouse Available Help at Discharge: Available 24 hours/day Type of Home: House Home Access: Level entry Home Layout: One level Bathroom Shower/Tub: Tub/shower unit;Curtain Biochemist, clinical: Standard Home Adaptive Equipment: Shower chair with back;Grab bars in shower;Straight cane;Walker - rolling;Bedside commode/3-in-1 (hemi walker) Prior Function Level of Independence: Independent with assistive device(s) Able to Take Stairs?: No Driving: Yes Vocation: Retired Corporate investment banker: No difficulties Dominant Hand: Right         Vision/Perception Vision - History Baseline Vision: No visual deficits    Cognition  Cognition Arousal/Alertness: Awake/alert Behavior During Therapy: WFL for tasks assessed/performed Overall Cognitive Status: Within Functional Limits for tasks assessed    Extremity/Trunk Assessment Right Upper Extremity Assessment RUE ROM/Strength/Tone: Within functional levels RUE Sensation: WFL - Light Touch;WFL - Proprioception Left Upper Extremity Assessment LUE ROM/Strength/Tone: Within functional levels LUE Sensation: WFL - Light Touch;WFL - Proprioception Right Lower Extremity Assessment RLE ROM/Strength/Tone: WFL for tasks assessed RLE Sensation: WFL - Light Touch;WFL - Proprioception Left Lower Extremity Assessment LLE ROM/Strength/Tone: WFL for tasks assessed LLE Sensation: WFL - Light Touch;WFL - Proprioception     Mobility Bed Mobility Bed Mobility: Supine to Sit;Sitting - Scoot to Edge of Bed Supine to Sit: 6: Modified independent (Device/Increase time);HOB elevated (pt reports he sleeps on many pillows, increased time) Sitting - Scoot to Edge of Bed: 7: Independent Transfers Sit to Stand: 5: Supervision;From toilet;4: Min guard;From bed (from bed x1, from toilet x1) Stand to Sit: To chair/3-in-1;To toilet;4: Min guard;5: Supervision (to chair x1, to toilet x1) Details for Transfer Assistance: Pt required minG for transfers in bathroom due to unfamiliar environment and toilet height           End of Session OT - End of Session Equipment Utilized During Treatment: Gait belt Activity Tolerance: Patient tolerated treatment well Patient left: in chair;with call bell/phone within reach  GO Functional Assessment Tool Used: Clinical observation Functional Limitation: Self care Self Care Current Status [id number]): At least 1 percent but less than 20 percent impaired, limited or restricted Self Care Goal Status [id number]): At least 1 percent but less than 20 percent  impaired, limited or restricted Self Care Discharge Status [phone]): At least 1 percent but less  than 20 percent impaired, limited or restricted   Almon Register [id number] 07/02/2012, 9:56 AM

## 2012-07-02 NOTE — Progress Notes (Signed)
Utilization Review Completed Alioune Hodgkin J. Amatullah Christy, RN, BSN, NCM [phone]  

## 2012-07-02 NOTE — Progress Notes (Signed)
Physical Therapy Discharge Patient Details Name: Colin [name] MRN: [id number] DOB: 08-18-1928 Today's Date: 07/02/2012 Time: [id number] PT Time Calculation (min): 19 min  Patient discharged from PT services secondary to pt functioning at baseline with necessary assist at home.  Please see latest therapy progress note for current level of functioning and progress toward goals.    Progress and discharge plan discussed with patient and/or caregiver: Patient/Caregiver agrees with plan  GP    Donnald Garre, Student Physical Therapist Office #: [phone]  07/02/2012, 9:30 AM

## 2012-07-03 LAB — URINE CULTURE

## 2012-07-04 NOTE — ED Provider Notes (Signed)
I saw and evaluated the patient, reviewed the resident's note and I agree with the findings and plan and agree with their ECG interpretation.syncop and hypoglycemia. Hypothermic. Will admit for futher glucose monitoring  Colin [name]. Alvino Chapel, MD 07/04/12 1408

## 2012-08-04 ENCOUNTER — Other Ambulatory Visit: Payer: Self-pay | Admitting: Cardiovascular Disease

## 2012-08-06 NOTE — Telephone Encounter (Signed)
Fax Received. Refill Completed. Colin [name] (R.M.A)   

## 2012-08-14 ENCOUNTER — Encounter: Payer: Self-pay | Admitting: *Deleted

## 2012-08-19 ENCOUNTER — Encounter: Payer: Self-pay | Admitting: Cardiovascular Disease

## 2012-09-04 ENCOUNTER — Ambulatory Visit (INDEPENDENT_AMBULATORY_CARE_PROVIDER_SITE_OTHER): Payer: Medicare Other | Admitting: *Deleted

## 2012-09-04 ENCOUNTER — Encounter: Payer: Self-pay | Admitting: Internal Medicine

## 2012-09-04 DIAGNOSIS — I428 Other cardiomyopathies: Secondary | ICD-10-CM

## 2012-09-04 DIAGNOSIS — I4891 Unspecified atrial fibrillation: Secondary | ICD-10-CM

## 2012-09-04 LAB — PACEMAKER DEVICE OBSERVATION
AL THRESHOLD: 0.5 V
ATRIAL PACING PM: 75
BAMS-0003: 70 {beats}/min
BATTERY VOLTAGE: 2.8 V
VENTRICULAR PACING PM: 1

## 2012-09-04 NOTE — Progress Notes (Signed)
PPM check in office. 

## 2012-12-30 ENCOUNTER — Encounter: Payer: Self-pay | Admitting: Family

## 2012-12-31 ENCOUNTER — Ambulatory Visit (INDEPENDENT_AMBULATORY_CARE_PROVIDER_SITE_OTHER): Payer: Medicare Other | Admitting: Family

## 2012-12-31 ENCOUNTER — Encounter: Payer: Self-pay | Admitting: Family

## 2012-12-31 ENCOUNTER — Ambulatory Visit (HOSPITAL_COMMUNITY)
Admission: RE | Admit: 2012-12-31 | Discharge: 2012-12-31 | Disposition: A | Discharge status: Home / Self Care | Payer: Medicare Other | Source: Ambulatory Visit | Attending: Family | Admitting: Family

## 2012-12-31 VITALS — BP 151/74 | HR 59 | Resp 14 | Ht 71.0 in | Wt 185.0 lb

## 2012-12-31 DIAGNOSIS — I739 Peripheral vascular disease, unspecified: Secondary | ICD-10-CM | POA: Insufficient documentation

## 2012-12-31 DIAGNOSIS — Z48812 Encounter for surgical aftercare following surgery on the circulatory system: Secondary | ICD-10-CM | POA: Insufficient documentation

## 2012-12-31 DIAGNOSIS — M25669 Stiffness of unspecified knee, not elsewhere classified: Secondary | ICD-10-CM

## 2012-12-31 DIAGNOSIS — M25661 Stiffness of right knee, not elsewhere classified: Secondary | ICD-10-CM

## 2012-12-31 NOTE — Progress Notes (Signed)
[facility] HISTORY AND PHYSICAL -PAD  History of Present Illness Colin [name] is a 79 y.o. male patient of Dr. Donnetta Hutching is with a history of a failed right femoropopliteal bypass graft and right transmetatarsal amputation in 2006. At his visit a year ago the patient declined further intervention. He returns today for ABI's and evaluation of his PAD. Patient denies claudication symptoms but does complain of right thigh feeling cold when he is out of doors, but the more he walks the warmer his leg feels; denies non-healing wounds. He complains of right knee stiffness at times when sitting. He gets around [place], takes care of himself and his property without significant difficulty.  Patient denies ever having a stroke, but stroke is noted as part of his past medical history.  Dr. Shelia Media requested Carotid Duplex done Nov., 2013, results are as follows: 1. Bilateral carotid bifurcation and proximal ICA plaque,  resulting in less than 50% diameter ICA stenosis. The exam does not  exclude plaque ulceration or embolization. Continued surveillance  recommended.  2. Right external carotid artery origin stenosis, of questionable  clinical significance   Patient denies New Medical or Surgical History.  Pt Diabetic: Yes, states in fairly good control Pt smoker: former smoker, quit 2004 when he had his CABG  Pt meds include: Statin :No, takes ezetimibe Betablocker: Yes ASA: Yes Other anticoagulants/antiplatelets: no  Past Medical History  Diagnosis Date  . Coronary artery disease     Status post CABG in 2004  . Peripheral vascular disease     Status post right femoropopliteal bypass  . Dyslipidemia   . Diabetes mellitus with peripheral artery disease   . Hypertension   . Chronic renal insufficiency     creatinine around 2.4  . CHF (congestive heart failure) 2007    ef 38%  . History of CVA (cerebrovascular accident)   . Cardiomyopathy   . COPD (chronic  obstructive pulmonary disease)   . History of anemia     Social History History  Substance Use Topics  . Smoking status: Former Smoker -- 1.00 packs/day for 50 years    Types: Cigarettes    Quit date: 06/09/2002  . Smokeless tobacco: Never Used  . Alcohol Use: No    Family History Family History  Problem Relation Age of Onset  . Hyperlipidemia Mother   . Heart disease Father   . Diabetes      Past Surgical History  Procedure Laterality Date  . Insert / replace / remove pacemaker  05/17/2005    [facility] [facility] dual chamber ddd mode   . Coronary artery bypass graft  4 of 2004    4 vessel bypass  . Femoral bypass  july 2006    right with right great toe amputation  . Cardiac catheterization  04/29/2002    Dr. Glade Lloyd  . Esophagogastroduodenoscopy  07/21/2011    Procedure: ESOPHAGOGASTRODUODENOSCOPY (EGD);  Surgeon: Beryle Beams, MD;  Location: [facility];  Service: Endoscopy;  Laterality: N/A;    Allergies  Allergen Reactions  . Lisinopril Other (See Comments)    REACTION:  Elevated potassium,renal insuf    Current Outpatient Prescriptions  Medication Sig Dispense Refill  . aspirin EC 81 MG EC tablet Take 4 tablets (325 mg total) by mouth daily.      . B Complex-C (B-COMPLEX WITH VITAMIN C) tablet Take 1 tablet by mouth daily.      . carvedilol (COREG) 6.25 MG tablet Take 1 tablet (6.25 mg total)  by mouth 2 (two) times daily with a meal.  60 tablet  5  . ezetimibe (ZETIA) 10 MG tablet Take 10 mg by mouth daily.      . furosemide (LASIX) 40 MG tablet TAKE 1 TABLET BY MOUTH EVERY DAY  30 tablet  5  . glucose 4 GM chewable tablet Chew 4 tablets (16 g total) by mouth as needed for low blood sugar.  50 tablet  12  . hydrALAZINE (APRESOLINE) 25 MG tablet Take 1 tablet (25 mg total) by mouth every 8 (eight) hours.  90 tablet  0  . insulin NPH-regular (NOVOLIN 70/30) (70-30) 100 UNIT/ML injection Inject 12 Units into the skin 2 (two) times daily with a meal.  10 mL  12  .  isosorbide mononitrate (IMDUR) 15 mg TB24 Take 0.5 tablets (15 mg total) by mouth daily.  45 tablet  0  . levofloxacin (LEVAQUIN) 250 MG tablet Take 1 tablet (250 mg total) by mouth daily.  7 tablet  0  . NEEDLE, DISP, 18 G 18G X 1-1/2" MISC 1 Container by Does not apply route 2 (two) times daily with a meal.  100 each  3  . Omega-3 Fatty Acids (FISH OIL) 1000 MG CAPS Take 1,000 mg by mouth daily.       Marland Kitchen omeprazole (PRILOSEC) 40 MG capsule Take 40 mg by mouth daily.      . sodium polystyrene (KAYEXALATE) 15 GM/60ML suspension Take 15 g by mouth 3 (three) times a week.       No current facility-administered medications for this visit.    Physical Examination  Filed Vitals:   12/31/12 1224  BP: 151/74  Pulse: 59  Resp: 14   Filed Weights   12/31/12 1224  Weight: 185 lb (83.915 kg)   Body mass index is 25.81 kg/(m^2).  General: A&O x 3, WDWN,  Gait: slightly stiff, mild limp, using cane. Eyes: PERRLA, bilateral arcus senilus Pulmonary: CTAB, without wheezes , rales or rhonchi Cardiac: regular Rythm , without murmur          Carotid Bruits Left Right   Negative Negative  Aorta: is not palpable. Radial pulses: are 2+ and =                           VASCULAR EXAM: Extremities without ischemic changes, s/p right transmetatarsal amputation remains well healed, no ulcers. Onchomycosis of the toenails of left foot. without Gangrene; without open wounds.                                                                                                          LE Pulses LEFT RIGHT       FEMORAL   palpable   palpable        POPLITEAL  not palpable   not palpable       POSTERIOR TIBIAL  not palpable    palpable        DORSALIS PEDIS      ANTERIOR TIBIAL not palpable  not palpable  Abdomen: soft, NT, no masses. Skin: no rashes, no ulcers noted. Musculoskeletal: no muscle wasting or atrophy.  Neurologic: A&O X 3; Appropriate Affect ; SENSATION: normal; MOTOR FUNCTION:  moving  all extremities equally, motor strength 5/5 throughout. Speech is fluent/normal. CN 2-12 intact.   Non-Invasive Vascular Imaging: DATE: 12/31/2012 ABI: RIGHT 0.32, Waveforms: absent and audibly monophasic;  LEFT 0.59, Waveforms: monophasic  ASSESSMENT: DESIREE ESTABROOKS is a 79 y.o. male who has a history of a failed right femoropopliteal bypass graft and right transmetatarsal amputation in 2006. He has documented history of a stroke but he does not recall having a stroke.  His PCP requested a carotid Duplex which was done a year ago which showed bilateral carotid bifurcation and proximal ICA plaque,  resulting in less than 50% diameter ICA stenosis.  Will add follow up carotid Duplex in a year when patient returns for ABI's unless Dr. Shelia Media has already scheduled this. Mr. Stumpo gets around adequately to take care of himself and his property. Vascular intervention will only be of benefit to him should he develop severe pain from claudication or non-healing wounds.  PLAN:  I discussed in depth with the patient the nature of atherosclerosis, and emphasized the importance of maximal medical management including strict control of blood pressure, blood glucose, and lipid levels, obtaining regular exercise, and continued cessation of smoking.  The patient is aware that without maximal medical management the underlying atherosclerotic disease process will progress, limiting the benefit of any interventions. Based on the patient's vascular studies and examination, and after discussing with Dr. Donnetta Hutching,  pt will return to clinic in 1 year for ABI's and carotid Duplex.  The patient was given information about PAD including signs, symptoms, treatment, what symptoms should prompt the patient to seek immediate medical care, and risk reduction measures to take.  Clemon Chambers, RN, MSN, FNP-C [facility] Phone: [phone]  Clinic MD: Early  12/31/2012 10:58 AM

## 2012-12-31 NOTE — Patient Instructions (Signed)
Peripheral Vascular Disease Peripheral Vascular Disease (PVD), also called Peripheral Arterial Disease (PAD), is a circulation problem caused by cholesterol (atherosclerotic plaque) deposits in the arteries. PVD commonly occurs in the lower extremities (legs) but it can occur in other areas of the body, such as your arms. The cholesterol buildup in the arteries reduces blood flow which can cause pain and other serious problems. The presence of PVD can place a person at risk for Coronary Artery Disease (CAD).  CAUSES  Causes of PVD can be many. It is usually associated with more than one risk factor such as:   High Cholesterol.  Smoking.  Diabetes.  Lack of exercise or inactivity.  High blood pressure (hypertension).  Obesity.  Family history. SYMPTOMS   When the lower extremities are affected, patients with PVD may experience:  Leg pain with exertion or physical activity. This is called INTERMITTENT CLAUDICATION. This may present as cramping or numbness with physical activity. The location of the pain is associated with the level of blockage. For example, blockage at the abdominal level (distal abdominal aorta) may result in buttock or hip pain. Lower leg arterial blockage may result in calf pain.  As PVD becomes more severe, pain can develop with less physical activity.  In people with severe PVD, leg pain may occur at rest.  Other PVD signs and symptoms:  Leg numbness or weakness.  Coldness in the affected leg or foot, especially when compared to the other leg.  A change in leg color.  Patients with significant PVD are more prone to ulcers or sores on toes, feet or legs. These may take longer to heal or may reoccur. The ulcers or sores can become infected.  If signs and symptoms of PVD are ignored, gangrene may occur. This can result in the loss of toes or loss of an entire limb.  Not all leg pain is related to PVD. Other medical conditions can cause leg pain such  as:  Blood clots (embolism) or Deep Vein Thrombosis.  Inflammation of the blood vessels (vasculitis).  Spinal stenosis. DIAGNOSIS  Diagnosis of PVD can involve several different types of tests. These can include:  Pulse Volume Recording Method (PVR). This test is simple, painless and does not involve the use of X-rays. PVR involves measuring and comparing the blood pressure in the arms and legs. An ABI (Ankle-Brachial Index) is calculated. The normal ratio of blood pressures is 1. As this number becomes smaller, it indicates more severe disease.  < 0.95  indicates significant narrowing in one or more leg vessels.  <0.8 there will usually be pain in the foot, leg or buttock with exercise.  <0.4 will usually have pain in the legs at rest.  <0.25  usually indicates limb threatening PVD.  Doppler detection of pulses in the legs. This test is painless and checks to see if you have a pulses in your legs/feet.  A dye or contrast material (a substance that highlights the blood vessels so they show up on x-ray) may be given to help your caregiver better see the arteries for the following tests. The dye is eliminated from your body by the kidney's. Your caregiver may order blood work to check your kidney function and other laboratory values before the following tests are performed:  Magnetic Resonance Angiography (MRA). An MRA is a picture study of the blood vessels and arteries. The MRA machine uses a large magnet to produce images of the blood vessels.  Computed Tomography Angiography (CTA). A CTA is a   specialized x-ray that looks at how the blood flows in your blood vessels. An IV may be inserted into your arm so contrast dye can be injected.  Angiogram. Is a procedure that uses x-rays to look at your blood vessels. This procedure is minimally invasive, meaning a small incision (cut) is made in your groin. A small tube (catheter) is then inserted into the artery of your groin. The catheter is  guided to the blood vessel or artery your caregiver wants to examine. Contrast dye is injected into the catheter. X-rays are then taken of the blood vessel or artery. After the images are obtained, the catheter is taken out. TREATMENT  Treatment of PVD involves many interventions which may include:  Lifestyle changes:  Quitting smoking.  Exercise.  Following a low fat, low cholesterol diet.  Control of diabetes.  Foot care is very important to the PVD patient. Good foot care can help prevent infection.  Medication:  Cholesterol-lowering medicine.  Blood pressure medicine.  Anti-platelet drugs.  Certain medicines may reduce symptoms of Intermittent Claudication.  Interventional/Surgical options:  Angioplasty. An Angioplasty is a procedure that inflates a balloon in the blocked artery. This opens the blocked artery to improve blood flow.  Stent Implant. A wire mesh tube (stent) is placed in the artery. The stent expands and stays in place, allowing the artery to remain open.  Peripheral Bypass Surgery. This is a surgical procedure that reroutes the blood around a blocked artery to help improve blood flow. This type of procedure may be performed if Angioplasty or stent implants are not an option. SEEK IMMEDIATE MEDICAL CARE IF:   You develop pain or numbness in your arms or legs.  Your arm or leg turns cold, becomes blue in color.  You develop redness, warmth, swelling and pain in your arms or legs. MAKE SURE YOU:   Understand these instructions.  Will watch your condition.  Will get help right away if you are not doing well or get worse. Document Released: 02/17/2004 Document Revised: 04/03/2011 Document Reviewed: 01/14/2008 [employer] Patient Information 2014 [employer].  

## 2013-01-02 ENCOUNTER — Other Ambulatory Visit: Payer: Self-pay | Admitting: *Deleted

## 2013-01-02 ENCOUNTER — Ambulatory Visit (INDEPENDENT_AMBULATORY_CARE_PROVIDER_SITE_OTHER): Payer: Medicare Other | Admitting: Cardiovascular Disease

## 2013-01-02 ENCOUNTER — Encounter: Payer: Self-pay | Admitting: Cardiovascular Disease

## 2013-01-02 VITALS — BP 154/63 | HR 65 | Ht 70.5 in | Wt 186.0 lb

## 2013-01-02 DIAGNOSIS — I10 Essential (primary) hypertension: Secondary | ICD-10-CM

## 2013-01-02 DIAGNOSIS — I739 Peripheral vascular disease, unspecified: Secondary | ICD-10-CM

## 2013-01-02 MED ORDER — ISOSORBIDE MONONITRATE ER 30 MG PO TB24: 30.0000 mg | ORAL_TABLET | Freq: Every day | ORAL | Status: DC

## 2013-01-02 MED ORDER — HYDRALAZINE HCL 10 MG PO TABS: 10.0000 mg | ORAL_TABLET | Freq: Three times a day (TID) | ORAL | Status: DC

## 2013-01-02 NOTE — Progress Notes (Signed)
Kinsman Date of Birth  1928/12/19       [facility] [address], [place], [place]  [address]   [place], [place]  [address] [phone]     [phone]   Fax  [phone]    Fax [phone]  Problem List: 1. Coronary artery disease-status post CABG 2004 2. Congestive heart failure-EF of 35% 3. Peripheral vascular disease 4. Dyslipidemia 5. Diabetes mellitus 6. Hypertension 10. Pacemaker 8. Chronic renal insufficiency with baseline creatinine around 2.4  History of Present Illness:  Mr. Schubring is doing well. He still has some chest pain that is related to positional changes. He does not have any angina pain. He's breathing is overall very good.  He seems to be stable from a cardiac standpoint.  He avoids salt.  July 01, 2012:  He is doing well.  No CP or dyspnea.  Dec. 11, 2014:  No CP, breathing is ok.    Current Outpatient Prescriptions on File Prior to Visit  Medication Sig Dispense Refill  . aspirin EC 81 MG EC tablet Take 4 tablets (325 mg total) by mouth daily.      . B Complex-C (B-COMPLEX WITH VITAMIN C) tablet Take 1 tablet by mouth daily.      . carvedilol (COREG) 6.25 MG tablet Take 1 tablet (6.25 mg total) by mouth 2 (two) times daily with a meal.  60 tablet  5  . ezetimibe (ZETIA) 10 MG tablet Take 10 mg by mouth daily.      . furosemide (LASIX) 40 MG tablet TAKE 1 TABLET BY MOUTH EVERY DAY  30 tablet  5  . glucose 4 GM chewable tablet Chew 4 tablets (16 g total) by mouth as needed for low blood sugar.  50 tablet  12  . Glucose Blood (FREESTYLE LITE TEST VI) daily before breakfast.      . isosorbide mononitrate (IMDUR) 15 mg TB24 Take 0.5 tablets (15 mg total) by mouth daily.  45 tablet  0  . levofloxacin (LEVAQUIN) 250 MG tablet Take 1 tablet (250 mg total) by mouth daily.  7 tablet  0  . linagliptin (TRADJENTA) 5 MG TABS tablet Take 5 mg by mouth daily.      . Omega-3 Fatty  Acids (FISH OIL) 1000 MG CAPS Take 1,000 mg by mouth daily.       Marland Kitchen omeprazole (PRILOSEC) 40 MG capsule Take 40 mg by mouth daily.      . sodium polystyrene (KAYEXALATE) 15 GM/60ML suspension Take 15 g by mouth 3 (three) times a week.       No current facility-administered medications on file prior to visit.    Allergies  Allergen Reactions  . Lisinopril Other (See Comments)    REACTION:  Elevated potassium,renal insuf    Past Medical History  Diagnosis Date  . Coronary artery disease     Status post CABG in 2004  . Peripheral vascular disease     Status post right femoropopliteal bypass  . Dyslipidemia   . Diabetes mellitus with peripheral artery disease   . Hypertension   . Chronic renal insufficiency     creatinine around 2.4  . CHF (congestive heart failure) 2007    ef 38%  . History of CVA (cerebrovascular accident)   . Cardiomyopathy   . COPD (chronic obstructive pulmonary disease)   . History of anemia     Past Surgical History  Procedure  Laterality Date  . Insert / replace / remove pacemaker  05/17/2005    [employer] dual chamber ddd mode   . Coronary artery bypass graft  4 of 2004    4 vessel bypass  . Femoral bypass  july 2006    right with right great toe amputation  . Cardiac catheterization  04/29/2002    Dr. Glade Lloyd  . Esophagogastroduodenoscopy  07/21/2011    Procedure: ESOPHAGOGASTRODUODENOSCOPY (EGD);  Surgeon: Beryle Beams, MD;  Location: [facility];  Service: Endoscopy;  Laterality: N/A;    History  Smoking status  . Former Smoker -- 1.00 packs/day for 50 years  . Types: Cigarettes  . Quit date: 06/09/2002  Smokeless tobacco  . Never Used    History  Alcohol Use No    Family History  Problem Relation Age of Onset  . Hyperlipidemia Mother   . Heart disease Father   . Diabetes      Reviw of Systems:  Reviewed in the HPI.  All other systems are negative.  Physical Exam: Blood pressure 154/63, pulse 65, height 5' 10.5" (1.791 m),  weight 186 lb (84.369 kg). General: Well developed, well nourished, in no acute distress.  Head: Normocephalic, atraumatic, sclera non-icteric, mucus membranes are moist,   Neck: Supple. Carotids are 2 + without bruits. No JVD  Lungs: Clear bilaterally to auscultation.  Heart: regular rate.  normal  S1 S2. No murmurs, gallops or rubs.  Abdomen: Soft, non-tender, non-distended with normal bowel sounds. No hepatomegaly. No rebound/guarding. No masses.  Msk:  Strength and tone are normal  Extremities: No clubbing or cyanosis. No edema.  Distal pedal pulses are 2+ and equal bilaterally.  Neuro: Alert and oriented X 3. Moves all extremities spontaneously.  Psych:  Responds to questions appropriately with a normal affect.  ECG:    Assessment / Plan:

## 2013-01-02 NOTE — Patient Instructions (Signed)
Your physician has recommended you make the following change in your medication:  INCREASE IMDUR TO 30 MG DAILY START HYDRALAZINE 10 MG THREE TIMES A DAY /// EVERY 8 HOURS LIKE 6 AM  2 PM  10 PM   Your physician recommends that you schedule a follow-up appointment in: [place] NP Your physician recommends that you return for lab work in: [place] // BMET   Your physician wants you to follow-up in: [place] will receive a reminder letter in the mail two months in advance. If you don't receive a letter, please call our office to schedule the follow-up appointment.

## 2013-01-02 NOTE — Assessment & Plan Note (Signed)
[name] seems to feel fairly well. His blood pressure remains mildly elevated. He is not a candidate for ACE inhibitor because his creatinine is 1.9. We'll start him on hydralazine 10 mg 3 times a day. We'll increase his isosorbide to 30 mg a day.    We'll have him return to see Margarita Grizzle in 3 months for an office visit and basic profile. I'll see him again in 6 months for office visit and basic metabolic profile.

## 2013-03-27 ENCOUNTER — Other Ambulatory Visit: Payer: Self-pay | Admitting: Cardiovascular Disease

## 2013-03-31 ENCOUNTER — Ambulatory Visit: Payer: Medicare Other | Admitting: Nurse Practitioner

## 2013-04-28 ENCOUNTER — Other Ambulatory Visit: Payer: Self-pay

## 2013-04-28 ENCOUNTER — Encounter: Payer: Self-pay | Admitting: Family

## 2013-04-28 DIAGNOSIS — I70269 Atherosclerosis of native arteries of extremities with gangrene, unspecified extremity: Secondary | ICD-10-CM

## 2013-04-29 ENCOUNTER — Other Ambulatory Visit (HOSPITAL_COMMUNITY): Payer: Self-pay

## 2013-04-29 ENCOUNTER — Encounter: Payer: Self-pay | Admitting: Family

## 2013-04-29 ENCOUNTER — Ambulatory Visit (HOSPITAL_COMMUNITY)
Admission: RE | Admit: 2013-04-29 | Discharge: 2013-04-29 | Disposition: A | Discharge status: Home / Self Care | Payer: Medicare Other | Source: Ambulatory Visit | Attending: Family | Admitting: Family

## 2013-04-29 ENCOUNTER — Encounter: Payer: Self-pay | Admitting: *Deleted

## 2013-04-29 ENCOUNTER — Other Ambulatory Visit: Payer: Self-pay | Admitting: *Deleted

## 2013-04-29 ENCOUNTER — Ambulatory Visit (INDEPENDENT_AMBULATORY_CARE_PROVIDER_SITE_OTHER): Payer: Medicare Other | Admitting: Family

## 2013-04-29 VITALS — BP 143/73 | HR 68 | Resp 14 | Ht 70.5 in | Wt 186.0 lb

## 2013-04-29 DIAGNOSIS — I70269 Atherosclerosis of native arteries of extremities with gangrene, unspecified extremity: Secondary | ICD-10-CM

## 2013-04-29 DIAGNOSIS — I739 Peripheral vascular disease, unspecified: Secondary | ICD-10-CM

## 2013-04-29 DIAGNOSIS — M79609 Pain in unspecified limb: Secondary | ICD-10-CM

## 2013-04-29 NOTE — Progress Notes (Signed)
[facility] HISTORY AND PHYSICAL -PAD  History of Present Illness Colin [name] is a 80 y.o. male patient of Dr. Donnetta Hutching is with a history of a failed right femoropopliteal bypass graft and right transmetatarsal amputation in 2006.  At his visit in 2013 the patient declined further intervention. He returns today per request of Dr. Sharol Given for evaluation of left gangrenous great toe. He is in a wheelchair now but states he walks some with a cane. He has been taking an antibiotic prescribed by Dr. Sharol Given since this week end, and states the left foot has improved a little in the last day or two. The left great toe started draining in the last day or two. Patient cannot recall when the left great toe turned dark, sister and grandson with him do not know. He has an appointment with Dr. Sharol Given tomorrow. Patient states he wants his left toe amputated.   Pt Diabetic: Yes, states in fairly good control  Pt smoker: former smoker, quit 2004 when he had his CABG  Pt meds include:  Statin :No, takes ezetimibe  Betablocker: Yes  ASA: Yes  Other anticoagulants/antiplatelets: no    Past Medical History  Diagnosis Date  . Coronary artery disease     Status post CABG in 2004  . Peripheral vascular disease     Status post right femoropopliteal bypass  . Dyslipidemia   . Diabetes mellitus with peripheral artery disease   . Hypertension   . Chronic renal insufficiency     creatinine around 2.4  . CHF (congestive heart failure) 2007    ef 38%  . History of CVA (cerebrovascular accident)   . Cardiomyopathy   . COPD (chronic obstructive pulmonary disease)   . History of anemia     Social History History  Substance Use Topics  . Smoking status: Former Smoker -- 1.00 packs/day for 50 years    Types: Cigarettes    Quit date: 06/09/2002  . Smokeless tobacco: Never Used  . Alcohol Use: No    Family History Family History  Problem Relation Age of Onset  .  Hyperlipidemia Mother   . Heart disease Father   . Diabetes      Past Surgical History  Procedure Laterality Date  . Insert / replace / remove pacemaker  05/17/2005    [employer] [facility] dual chamber ddd mode   . Coronary artery bypass graft  4 of 2004    4 vessel bypass  . Femoral bypass  july 2006    right with right great toe amputation  . Cardiac catheterization  04/29/2002    Dr. Glade Lloyd  . Esophagogastroduodenoscopy  07/21/2011    Procedure: ESOPHAGOGASTRODUODENOSCOPY (EGD);  Surgeon: Beryle Beams, MD;  Location: [facility];  Service: Endoscopy;  Laterality: N/A;    Allergies  Allergen Reactions  . Lisinopril Other (See Comments)    REACTION:  Elevated potassium,renal insuf    Current Outpatient Prescriptions  Medication Sig Dispense Refill  . aspirin EC 81 MG EC tablet Take 4 tablets (325 mg total) by mouth daily.      . B Complex-C (B-COMPLEX WITH VITAMIN C) tablet Take 1 tablet by mouth daily.      . Blood Glucose Monitoring Suppl (ONE TOUCH ULTRA 2) W/DEVICE KIT       . carvedilol (COREG) 6.25 MG tablet Take 1 tablet (6.25 mg total) by mouth 2 (two) times daily with a meal.  60 tablet  5  . cephALEXin (KEFLEX) 250 MG  capsule       . ezetimibe (ZETIA) 10 MG tablet Take 10 mg by mouth daily.      . furosemide (LASIX) 40 MG tablet TAKE 1 TABLET BY MOUTH EVERY DAY  30 tablet  3  . glucose 4 GM chewable tablet Chew 4 tablets (16 g total) by mouth as needed for low blood sugar.  50 tablet  12  . Glucose Blood (FREESTYLE LITE TEST VI) daily before breakfast.      . hydrALAZINE (APRESOLINE) 10 MG tablet Take 1 tablet (10 mg total) by mouth 3 (three) times daily. 8 HOURS APART LIKE 6 AM 2 PM 10 PM  90 tablet  6  . isosorbide mononitrate (IMDUR) 30 MG 24 hr tablet Take 1 tablet (30 mg total) by mouth daily.  90 tablet  1  . levofloxacin (LEVAQUIN) 250 MG tablet Take 1 tablet (250 mg total) by mouth daily.  7 tablet  0  . linagliptin (TRADJENTA) 5 MG TABS tablet Take 5 mg by mouth  daily.      . Omega-3 Fatty Acids (FISH OIL) 1000 MG CAPS Take 1,000 mg by mouth daily.       Marland Kitchen omeprazole (PRILOSEC) 40 MG capsule Take 40 mg by mouth daily.      . sodium polystyrene (KAYEXALATE) 15 GM/60ML suspension Take 15 g by mouth 3 (three) times a week.       No current facility-administered medications for this visit.    ROS: See HPI for pertinent positives and negatives.   Physical Examination  . Filed Vitals:   04/29/13 0922  BP: 143/73  Pulse: 68  Resp: 14   Filed Weights   04/29/13 0922  Weight: 186 lb (84.369 kg)    Body mass index is 26.3 kg/(m^2).  General: A&O x 3, WDWN, in wheelchair. Pulmonary: CTAB, without wheezes , rales or rhonchi. Cardiac: regular Rythm , without detected murmur.         Carotid Bruits Left Right   Negative Negative  Aorta is not palpable. Radial pulses: are 1+ and =.                           VASCULAR EXAM: Extremities with ischemic changes : left great toe is gangrenous, swollen, foul smelling, draining serosanguinous fluid, left lower foot is slightly swollen, slightly red.                                                                                                           LE Pulses LEFT RIGHT       FEMORAL   palpable   palpable        POPLITEAL  not palpable   not palpable       POSTERIOR TIBIAL  not palpable   not palpable        DORSALIS PEDIS      ANTERIOR TIBIAL not palpable  not palpable    Abdomen: soft, NT, no masses. Skin: see extremities. Musculoskeletal: s/p transmetatarsal amputation right foot.  Neurologic:  A&O X 3; Appropriate Affect ; SENSATION: normal; MOTOR FUNCTION:  moving all extremities equally. Speech is fluent/normal. CN 2-12 grossly intact.    Non-Invasive Vascular Imaging: DATE: 04/29/2013 ABI: RIGHT 0.55, Waveforms: monophasic;  LEFT 0.52, Waveforms: monophasic Previous (12/31/2012) ABI's: Right: 0.32, Left: 0.59  ASSESSMENT: Colin [name] is a 80 y.o. male who presents with:  gangrenous right great toe, cellulitis of left foot, severe pain in left toe and foot.  Family states patient is scheduled to see Dr. Sharol Given tomorrow to discuss the possible extent of the amputation of left great toe vs partial amputation or amputation of foot vs BKA.   PLAN:  Arteriogram scheduled with Dr. Donnetta Hutching this week, coordinated with Dr. Sharol Given re extent of LLE amputation.  The patient was given information about PAD including signs, symptoms, treatment, what symptoms should prompt the patient to seek immediate medical care, and risk reduction measures to take.  Clemon Chambers, RN, MSN, FNP-C [facility] Phone: [phone]  Clinic MD: Early  04/29/2013 9:18 AM

## 2013-04-29 NOTE — Progress Notes (Signed)
Called Dr. Jess Barters office and left message with Malachy Mood that we need pre-op orders.

## 2013-04-29 NOTE — Patient Instructions (Signed)
Peripheral Vascular Disease Peripheral Vascular Disease (PVD), also called Peripheral Arterial Disease (PAD), is a circulation problem caused by cholesterol (atherosclerotic plaque) deposits in the arteries. PVD commonly occurs in the lower extremities (legs) but it can occur in other areas of the body, such as your arms. The cholesterol buildup in the arteries reduces blood flow which can cause pain and other serious problems. The presence of PVD can place a person at risk for Coronary Artery Disease (CAD).  CAUSES  Causes of PVD can be many. It is usually associated with more than one risk factor such as:   High Cholesterol.  Smoking.  Diabetes.  Lack of exercise or inactivity.  High blood pressure (hypertension).  Obesity.  Family history. SYMPTOMS   When the lower extremities are affected, patients with PVD may experience:  Leg pain with exertion or physical activity. This is called INTERMITTENT CLAUDICATION. This may present as cramping or numbness with physical activity. The location of the pain is associated with the level of blockage. For example, blockage at the abdominal level (distal abdominal aorta) may result in buttock or hip pain. Lower leg arterial blockage may result in calf pain.  As PVD becomes more severe, pain can develop with less physical activity.  In people with severe PVD, leg pain may occur at rest.  Other PVD signs and symptoms:  Leg numbness or weakness.  Coldness in the affected leg or foot, especially when compared to the other leg.  A change in leg color.  Patients with significant PVD are more prone to ulcers or sores on toes, feet or legs. These may take longer to heal or may reoccur. The ulcers or sores can become infected.  If signs and symptoms of PVD are ignored, gangrene may occur. This can result in the loss of toes or loss of an entire limb.  Not all leg pain is related to PVD. Other medical conditions can cause leg pain such  as:  Blood clots (embolism) or Deep Vein Thrombosis.  Inflammation of the blood vessels (vasculitis).  Spinal stenosis. DIAGNOSIS  Diagnosis of PVD can involve several different types of tests. These can include:  Pulse Volume Recording Method (PVR). This test is simple, painless and does not involve the use of X-rays. PVR involves measuring and comparing the blood pressure in the arms and legs. An ABI (Ankle-Brachial Index) is calculated. The normal ratio of blood pressures is 1. As this number becomes smaller, it indicates more severe disease.  < 0.95  indicates significant narrowing in one or more leg vessels.  <0.8 there will usually be pain in the foot, leg or buttock with exercise.  <0.4 will usually have pain in the legs at rest.  <0.25  usually indicates limb threatening PVD.  Doppler detection of pulses in the legs. This test is painless and checks to see if you have a pulses in your legs/feet.  A dye or contrast material (a substance that highlights the blood vessels so they show up on x-ray) may be given to help your caregiver better see the arteries for the following tests. The dye is eliminated from your body by the kidney's. Your caregiver may order blood work to check your kidney function and other laboratory values before the following tests are performed:  Magnetic Resonance Angiography (MRA). An MRA is a picture study of the blood vessels and arteries. The MRA machine uses a large magnet to produce images of the blood vessels.  Computed Tomography Angiography (CTA). A CTA is a   specialized x-ray that looks at how the blood flows in your blood vessels. An IV may be inserted into your arm so contrast dye can be injected.  Angiogram. Is a procedure that uses x-rays to look at your blood vessels. This procedure is minimally invasive, meaning a small incision (cut) is made in your groin. A small tube (catheter) is then inserted into the artery of your groin. The catheter is  guided to the blood vessel or artery your caregiver wants to examine. Contrast dye is injected into the catheter. X-rays are then taken of the blood vessel or artery. After the images are obtained, the catheter is taken out. TREATMENT  Treatment of PVD involves many interventions which may include:  Lifestyle changes:  Quitting smoking.  Exercise.  Following a low fat, low cholesterol diet.  Control of diabetes.  Foot care is very important to the PVD patient. Good foot care can help prevent infection.  Medication:  Cholesterol-lowering medicine.  Blood pressure medicine.  Anti-platelet drugs.  Certain medicines may reduce symptoms of Intermittent Claudication.  Interventional/Surgical options:  Angioplasty. An Angioplasty is a procedure that inflates a balloon in the blocked artery. This opens the blocked artery to improve blood flow.  Stent Implant. A wire mesh tube (stent) is placed in the artery. The stent expands and stays in place, allowing the artery to remain open.  Peripheral Bypass Surgery. This is a surgical procedure that reroutes the blood around a blocked artery to help improve blood flow. This type of procedure may be performed if Angioplasty or stent implants are not an option. SEEK IMMEDIATE MEDICAL CARE IF:   You develop pain or numbness in your arms or legs.  Your arm or leg turns cold, becomes blue in color.  You develop redness, warmth, swelling and pain in your arms or legs. MAKE SURE YOU:   Understand these instructions.  Will watch your condition.  Will get help right away if you are not doing well or get worse. Document Released: 02/17/2004 Document Revised: 04/03/2011 Document Reviewed: 01/14/2008 [employer] Patient Information 2014 [employer].  

## 2013-04-30 ENCOUNTER — Encounter (HOSPITAL_COMMUNITY): Payer: Self-pay | Admitting: *Deleted

## 2013-04-30 ENCOUNTER — Encounter (HOSPITAL_COMMUNITY)
Admission: RE | Disposition: A | Discharge status: Skilled Nursing Facility | Payer: Self-pay | Source: Ambulatory Visit | Attending: Vascular Surgery

## 2013-04-30 ENCOUNTER — Encounter (HOSPITAL_COMMUNITY): Admission: RE | Payer: Self-pay | Source: Ambulatory Visit

## 2013-04-30 ENCOUNTER — Inpatient Hospital Stay (HOSPITAL_COMMUNITY): Admission: RE | Admit: 2013-04-30 | Payer: Medicare Other | Source: Ambulatory Visit | Admitting: Orthopedic Surgery

## 2013-04-30 ENCOUNTER — Inpatient Hospital Stay (HOSPITAL_COMMUNITY): Payer: Medicare Other

## 2013-04-30 ENCOUNTER — Inpatient Hospital Stay (HOSPITAL_COMMUNITY)
Admission: RE | Admit: 2013-04-30 | Discharge: 2013-05-08 | DRG: 253 | Disposition: A | Discharge status: Skilled Nursing Facility | Payer: Medicare Other | Source: Ambulatory Visit | Attending: Vascular Surgery | Admitting: Vascular Surgery

## 2013-04-30 DIAGNOSIS — I739 Peripheral vascular disease, unspecified: Secondary | ICD-10-CM

## 2013-04-30 DIAGNOSIS — E785 Hyperlipidemia, unspecified: Secondary | ICD-10-CM | POA: Diagnosis present

## 2013-04-30 DIAGNOSIS — Z8673 Personal history of transient ischemic attack (TIA), and cerebral infarction without residual deficits: Secondary | ICD-10-CM

## 2013-04-30 DIAGNOSIS — K219 Gastro-esophageal reflux disease without esophagitis: Secondary | ICD-10-CM | POA: Diagnosis present

## 2013-04-30 DIAGNOSIS — M129 Arthropathy, unspecified: Secondary | ICD-10-CM | POA: Diagnosis present

## 2013-04-30 DIAGNOSIS — E1159 Type 2 diabetes mellitus with other circulatory complications: Principal | ICD-10-CM | POA: Diagnosis present

## 2013-04-30 DIAGNOSIS — Z794 Long term (current) use of insulin: Secondary | ICD-10-CM

## 2013-04-30 DIAGNOSIS — N183 Chronic kidney disease, stage 3 unspecified: Secondary | ICD-10-CM | POA: Diagnosis present

## 2013-04-30 DIAGNOSIS — I428 Other cardiomyopathies: Secondary | ICD-10-CM | POA: Diagnosis present

## 2013-04-30 DIAGNOSIS — L97509 Non-pressure chronic ulcer of other part of unspecified foot with unspecified severity: Secondary | ICD-10-CM | POA: Diagnosis present

## 2013-04-30 DIAGNOSIS — L98499 Non-pressure chronic ulcer of skin of other sites with unspecified severity: Secondary | ICD-10-CM

## 2013-04-30 DIAGNOSIS — L97809 Non-pressure chronic ulcer of other part of unspecified lower leg with unspecified severity: Secondary | ICD-10-CM | POA: Diagnosis present

## 2013-04-30 DIAGNOSIS — I5022 Chronic systolic (congestive) heart failure: Secondary | ICD-10-CM | POA: Diagnosis present

## 2013-04-30 DIAGNOSIS — I70269 Atherosclerosis of native arteries of extremities with gangrene, unspecified extremity: Secondary | ICD-10-CM | POA: Diagnosis present

## 2013-04-30 DIAGNOSIS — E1101 Type 2 diabetes mellitus with hyperosmolarity with coma: Secondary | ICD-10-CM | POA: Diagnosis present

## 2013-04-30 DIAGNOSIS — I252 Old myocardial infarction: Secondary | ICD-10-CM

## 2013-04-30 DIAGNOSIS — F0390 Unspecified dementia without behavioral disturbance: Secondary | ICD-10-CM | POA: Diagnosis present

## 2013-04-30 DIAGNOSIS — Z951 Presence of aortocoronary bypass graft: Secondary | ICD-10-CM

## 2013-04-30 DIAGNOSIS — J449 Chronic obstructive pulmonary disease, unspecified: Secondary | ICD-10-CM | POA: Diagnosis present

## 2013-04-30 DIAGNOSIS — I129 Hypertensive chronic kidney disease with stage 1 through stage 4 chronic kidney disease, or unspecified chronic kidney disease: Secondary | ICD-10-CM | POA: Diagnosis present

## 2013-04-30 DIAGNOSIS — Z950 Presence of cardiac pacemaker: Secondary | ICD-10-CM

## 2013-04-30 DIAGNOSIS — I509 Heart failure, unspecified: Secondary | ICD-10-CM | POA: Diagnosis present

## 2013-04-30 DIAGNOSIS — J4489 Other specified chronic obstructive pulmonary disease: Secondary | ICD-10-CM | POA: Diagnosis present

## 2013-04-30 DIAGNOSIS — I2510 Atherosclerotic heart disease of native coronary artery without angina pectoris: Secondary | ICD-10-CM | POA: Diagnosis present

## 2013-04-30 DIAGNOSIS — D649 Anemia, unspecified: Secondary | ICD-10-CM | POA: Diagnosis present

## 2013-04-30 HISTORY — DX: Unspecified dementia, unspecified severity, without behavioral disturbance, psychotic disturbance, mood disturbance, and anxiety: F03.90

## 2013-04-30 HISTORY — DX: Unspecified osteoarthritis, unspecified site: M19.90

## 2013-04-30 HISTORY — DX: Gastro-esophageal reflux disease without esophagitis: K21.9

## 2013-04-30 HISTORY — DX: Acute myocardial infarction, unspecified: I21.9

## 2013-04-30 HISTORY — PX: LOWER EXTREMITY ANGIOGRAM: SHX5508

## 2013-04-30 HISTORY — PX: ABDOMINAL ANGIOGRAM: SHX5499

## 2013-04-30 LAB — GLUCOSE, CAPILLARY
GLUCOSE-CAPILLARY: 457 mg/dL — AB (ref 70–99)
GLUCOSE-CAPILLARY: 438 mg/dL — AB (ref 70–99)
GLUCOSE-CAPILLARY: 277 mg/dL — AB (ref 70–99)
Glucose-Capillary: 375 mg/dL — ABNORMAL HIGH (ref 70–99)
Glucose-Capillary: 183 mg/dL — ABNORMAL HIGH (ref 70–99)
Glucose-Capillary: 140 mg/dL — ABNORMAL HIGH (ref 70–99)

## 2013-04-30 LAB — COMPREHENSIVE METABOLIC PANEL
ALBUMIN: 2.1 g/dL — AB (ref 3.5–5.2)
ALT: 15 U/L (ref 0–53)
AST: 16 U/L (ref 0–37)
Alkaline Phosphatase: 61 U/L (ref 39–117)
BUN: 53 mg/dL — ABNORMAL HIGH (ref 6–23)
CALCIUM: 9.4 mg/dL (ref 8.4–10.5)
CO2: 23 mEq/L (ref 19–32)
CREATININE: 2.25 mg/dL — AB (ref 0.50–1.35)
Chloride: 96 mEq/L (ref 96–112)
GFR calc Af Amer: 30 mL/min — ABNORMAL LOW (ref >90)
GFR calc non Af Amer: 25 mL/min — ABNORMAL LOW (ref >90)
Glucose, Bld: 272 mg/dL — ABNORMAL HIGH (ref 70–99)
Potassium: 4.1 mEq/L (ref 3.7–5.3)
Sodium: 136 mEq/L — ABNORMAL LOW (ref 137–147)
Total Bilirubin: 0.4 mg/dL (ref 0.3–1.2)
Total Protein: 7.4 g/dL (ref 6.0–8.3)

## 2013-04-30 LAB — PROTIME-INR
INR: 1.05 (ref 0.00–1.49)
Prothrombin Time: 13.5 seconds (ref 11.6–15.2)

## 2013-04-30 LAB — POCT I-STAT, CHEM 8
BUN: 52 mg/dL — ABNORMAL HIGH (ref 6–23)
CALCIUM ION: 1.25 mmol/L (ref 1.13–1.30)
Chloride: 96 mEq/L (ref 96–112)
Creatinine, Ser: 2.8 mg/dL — ABNORMAL HIGH (ref 0.50–1.35)
Glucose, Bld: 500 mg/dL — ABNORMAL HIGH (ref 70–99)
HEMATOCRIT: 37 % — AB (ref 39.0–52.0)
HEMOGLOBIN: 12.6 g/dL — AB (ref 13.0–17.0)
Potassium: 4.2 mEq/L (ref 3.7–5.3)
Sodium: 132 mEq/L — ABNORMAL LOW (ref 137–147)
TCO2: 25 mmol/L (ref 0–100)

## 2013-04-30 LAB — CBC
HCT: 31.5 % — ABNORMAL LOW (ref 39.0–52.0)
Hemoglobin: 10.3 g/dL — ABNORMAL LOW (ref 13.0–17.0)
MCH: 27.2 pg (ref 26.0–34.0)
MCHC: 32.7 g/dL (ref 30.0–36.0)
MCV: 83.3 fL (ref 78.0–100.0)
PLATELETS: 308 10*3/uL (ref 150–400)
RBC: 3.78 MIL/uL — ABNORMAL LOW (ref 4.22–5.81)
RDW: 14.3 % (ref 11.5–15.5)
WBC: 6.9 10*3/uL (ref 4.0–10.5)

## 2013-04-30 SURGERY — ANGIOGRAM, LOWER EXTREMITY
Anesthesia: LOCAL

## 2013-04-30 SURGERY — AMPUTATION, FOOT, RAY
Anesthesia: General | Laterality: Left

## 2013-04-30 SURGERY — ABDOMINAL AORTAGRAM
Anesthesia: LOCAL

## 2013-04-30 MED ORDER — ONDANSETRON HCL 4 MG/2ML IJ SOLN: 4.0000 mg | Freq: Four times a day (QID) | INTRAMUSCULAR | Status: DC | PRN

## 2013-04-30 MED ORDER — OXYCODONE HCL 5 MG PO TABS
5.0000 mg | ORAL_TABLET | ORAL | Status: DC | PRN
  Administered 2013-05-01 – 2013-05-05 (×2): 5 mg via ORAL
  Administered 2013-05-06 – 2013-05-08 (×3): 10 mg via ORAL
  Filled 2013-04-30 (×3): qty 2
  Filled 2013-04-30 (×2): qty 1

## 2013-04-30 MED ORDER — ALUM & MAG HYDROXIDE-SIMETH 200-200-20 MG/5ML PO SUSP: 15.0000 mL | ORAL | Status: DC | PRN

## 2013-04-30 MED ORDER — METOPROLOL TARTRATE 1 MG/ML IV SOLN: 2.0000 mg | INTRAVENOUS | Status: DC | PRN

## 2013-04-30 MED ORDER — LIDOCAINE HCL (PF) 1 % IJ SOLN
INTRAMUSCULAR | Status: AC
  Filled 2013-04-30: qty 30

## 2013-04-30 MED ORDER — PHENOL 1.4 % MT LIQD
1.0000 | OROMUCOSAL | Status: DC | PRN
  Filled 2013-04-30: qty 177

## 2013-04-30 MED ORDER — MORPHINE SULFATE 10 MG/ML IJ SOLN: 2.0000 mg | INTRAMUSCULAR | Status: DC | PRN

## 2013-04-30 MED ORDER — DEXTROSE 5 % IV SOLN
1.5000 g | INTRAVENOUS | Status: AC
  Administered 2013-05-01: 1.5 g via INTRAVENOUS
  Filled 2013-04-30: qty 1.5

## 2013-04-30 MED ORDER — HYDRALAZINE HCL 20 MG/ML IJ SOLN: 10.0000 mg | INTRAMUSCULAR | Status: DC | PRN

## 2013-04-30 MED ORDER — SODIUM CHLORIDE 0.9 % IV SOLN
INTRAVENOUS | Status: DC
  Administered 2013-04-30: 20:00:00 via INTRAVENOUS

## 2013-04-30 MED ORDER — PANTOPRAZOLE SODIUM 40 MG PO TBEC
40.0000 mg | DELAYED_RELEASE_TABLET | Freq: Every day | ORAL | Status: DC
  Administered 2013-04-30: 40 mg via ORAL
  Filled 2013-04-30: qty 1

## 2013-04-30 MED ORDER — POTASSIUM CHLORIDE CRYS ER 20 MEQ PO TBCR
20.0000 meq | EXTENDED_RELEASE_TABLET | Freq: Once | ORAL | Status: DC
  Filled 2013-04-30: qty 2

## 2013-04-30 MED ORDER — INSULIN ASPART 100 UNIT/ML ~~LOC~~ SOLN
0.0000 [IU] | Freq: Three times a day (TID) | SUBCUTANEOUS | Status: DC
  Administered 2013-05-01: 4 [IU] via SUBCUTANEOUS
  Administered 2013-05-01: 15 [IU] via SUBCUTANEOUS
  Administered 2013-05-02: 7 [IU] via SUBCUTANEOUS
  Administered 2013-05-02 – 2013-05-03 (×2): 4 [IU] via SUBCUTANEOUS
  Administered 2013-05-03: 7 [IU] via SUBCUTANEOUS
  Administered 2013-05-03: 3 [IU] via SUBCUTANEOUS
  Administered 2013-05-04 (×2): 4 [IU] via SUBCUTANEOUS
  Administered 2013-05-04: 11 [IU] via SUBCUTANEOUS
  Administered 2013-05-05: 4 [IU] via SUBCUTANEOUS
  Administered 2013-05-06: 3 [IU] via SUBCUTANEOUS
  Administered 2013-05-06 (×2): 4 [IU] via SUBCUTANEOUS
  Administered 2013-05-07: 7 [IU] via SUBCUTANEOUS
  Administered 2013-05-07 – 2013-05-08 (×3): 4 [IU] via SUBCUTANEOUS
  Administered 2013-05-08: 7 [IU] via SUBCUTANEOUS

## 2013-04-30 MED ORDER — INSULIN GLARGINE 100 UNIT/ML ~~LOC~~ SOLN
15.0000 [IU] | Freq: Every day | SUBCUTANEOUS | Status: DC
  Administered 2013-04-30 – 2013-05-07 (×8): 15 [IU] via SUBCUTANEOUS
  Filled 2013-04-30 (×9): qty 0.15

## 2013-04-30 MED ORDER — SODIUM CHLORIDE 0.9 % IV SOLN: INTRAVENOUS | Status: DC

## 2013-04-30 MED ORDER — SODIUM CHLORIDE 0.9 % IV SOLN
1.0000 mL/kg/h | INTRAVENOUS | Status: DC
  Administered 2013-05-01: 1 mL/kg/h via INTRAVENOUS

## 2013-04-30 MED ORDER — ACETAMINOPHEN 325 MG RE SUPP
325.0000 mg | RECTAL | Status: DC | PRN
  Filled 2013-04-30: qty 2

## 2013-04-30 MED ORDER — LABETALOL HCL 5 MG/ML IV SOLN
10.0000 mg | INTRAVENOUS | Status: DC | PRN
  Filled 2013-04-30: qty 4

## 2013-04-30 MED ORDER — GUAIFENESIN-DM 100-10 MG/5ML PO SYRP
15.0000 mL | ORAL_SOLUTION | ORAL | Status: DC | PRN
  Filled 2013-04-30: qty 15

## 2013-04-30 MED ORDER — ACETAMINOPHEN 325 MG PO TABS
325.0000 mg | ORAL_TABLET | ORAL | Status: DC | PRN
  Administered 2013-05-01: 325 mg via ORAL
  Filled 2013-04-30: qty 1
  Filled 2013-04-30: qty 2

## 2013-04-30 MED ORDER — HEPARIN (PORCINE) IN NACL 2-0.9 UNIT/ML-% IJ SOLN
INTRAMUSCULAR | Status: AC
  Filled 2013-04-30: qty 1000

## 2013-04-30 MED ORDER — SODIUM CHLORIDE 0.9 % IV SOLN: 500.0000 mL | Freq: Once | INTRAVENOUS | Status: AC | PRN

## 2013-04-30 MED ORDER — INSULIN ASPART 100 UNIT/ML ~~LOC~~ SOLN
10.0000 [IU] | Freq: Once | SUBCUTANEOUS | Status: AC
  Administered 2013-04-30: 10 [IU] via SUBCUTANEOUS
  Filled 2013-04-30: qty 0.1

## 2013-04-30 NOTE — Progress Notes (Signed)
STEPHANIE IN CATH LAB NOTIFIED OF CBG AND THAT INSULIN GIVEN

## 2013-04-30 NOTE — Progress Notes (Signed)
Inpatient Diabetes Program Recommendations  AACE/ADA: New Consensus Statement on Inpatient Glycemic Control (2013)  Target Ranges:  Prepandial:   less than 140 mg/dL      Peak postprandial:   less than 180 mg/dL (1-2 hours)      Critically ill patients:  140 - 180 mg/dL   Reason for Visit: Referral received.  CBG greater than 500 mg/dL today.  For surgery tomorrow.  Diabetes history: Type 2-A1C pending Outpatient Diabetes medications: Tradgenta 5 mg daily Current orders for Inpatient glycemic control: Novolog resistant tid with meals.  Patient received Novolog 10 units x 2 today and CBG now down to 183 mg/dL.  A1C pending Consider adding Lantus 15 units daily.  Called and discussed with PA.  Orders received.  Will follow while patient is in the hospital.    Thanks, Adah Perl, RN, BC-ADM Inpatient Diabetes Coordinator Pager [phone]

## 2013-04-30 NOTE — Progress Notes (Signed)
Report called and transferred to 2013

## 2013-04-30 NOTE — Discharge Instructions (Signed)
Arteriogram  An arteriogram (or angiogram) is an X-ray test of your blood vessels. You will be awake during the test. This test looks for:  Blocked blood vessels.  Blood vessels that are not normal. BEFORE THE TEST Schedule an appointment for your arteriogram. Let the personknow if:  You have diabetes.  You are allergic to any food or medicine.  You are allergic to X-ray dye (contrast).  You have asthma or had it when you were a child.  You are pregnant or you might be pregnant.  You are taking aspirin or blood thinners. The night before the test:   Do not  eat or drink anything after midnight. On the morning of your test:   Do not drive. Have someone bring you and take you home.  Bring all the medicines you need to take with you. If you have diabetes, do not take your insulin before the test. Take your medicines when the test is over.  You will need to sign a form that says you agree to have the test (consent form). THE TEST  You will lie on the X-ray table.  An IV tube is started in your arm.  Your blood pressure and heartbeat are checked during the test.  The upper part of your leg is shaved and washed with special soap.  You are then covered with a germ free (sterile) sheet. Keep your arms at your side under the sheet at all times so that germs do not get on the sheet.  The skin is numbed where the thin tube (catheter) is put into your upper leg. The thin tube is moved up into the blood vessels that your doctor wants to see.  Dye is put in through the tube. You may have a feeling of heat as the dye moves into your body.  X-ray pictures of your blood vessels are taken. Do not move while the dye goes in and pictures are taken. AFTER THE TEST  The thin tube will be taken out of your upper leg.  The nurse will check:  Your blood pressure.  The place where the thin tube was taken out to make sure it is not bleeding.  The blood flow to your feet.  Lie flat in  bed. Keep your leg straight for 6 hours after the thin tube is taken out. Finding out the results of your test Ask when your test results will be ready. Make sure you get your test results. Document Released: 04/07/2008 Document Revised: 04/03/2011 Document Reviewed: 04/07/2008 [facility] Patient Information 2014 [employer] [place], [place].

## 2013-04-30 NOTE — Op Note (Signed)
OPERATIVE REPORT  Date of Surgery: 04/30/2013  Surgeon: [name], MD  Assistant nurse Pre-op Diagnosis: Ischemic ulcers left first and second toe secondary to femoral popliteal and tibial occlusive disease Post-op Diagnosis: Same  Procedure: Procedure(s): Abdominal aortogram and bilateral iliac angiography via right common femoral percutaneous approach using CO2 Section order catheterization left external iliac artery with left lower extremity angiogram using CO2 and 30 cc of Visapaque  CO2 utilized -240 cc  Anesthesia: General  EBL: 20 cc  Complications: None  Procedure Details: The patient was taken to the  peripheral endovascular lab and placed in the supine position at which time both inguinal areas were prepped with Betadine scrub and solution draped in routine sterile manner. After an appropriate timeout was called the right common femoral artery was entered percutaneously guidewire passed into the suprarenal aorta under fluoroscopic guidance. 5 French sheath and dilator were passed over the guidewire. Standard pigtail catheter was positioned in the suprarenal aorta an abdominal aortogram performed using CO2. This revealed the aorta and both iliac systems to be widely patent with no significant stenoses evident. There were single widely patent renal arteries bilaterally. The catheter was then withdrawn into the terminal aorta and a second angiogram was performed to better visualize the iliac arteries and this was also performed with CO2. Both common internal and external iliac arteries were free of severe stenotic lesions. Following this the pigtail catheter was exchanged for an IMA catheter and a guidewire advanced into the left common femoral artery without difficulty. Inflow catheter was then positioned in the distal left external iliac artery an angiogram of the left leg performed with CO2. This revealed the common femoral and profundus femoris artery on the left to be widely  patent. The superficial femoral artery was occluded just distal to its origin. It reconstituted about 8-10 cm above the knee joint and was widely patent across the knee joint with one-vessel runoff through the peroneal artery. The anterior and posterior tibial arteries were occluded. To get better visualization one angiogram using 30 cc of Visapaque was performed which revealed a widely patent popliteal artery and one-vessel runoff of the peroneal artery with some mild stenotic lesions. 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

## 2013-04-30 NOTE — Anesthesia Preprocedure Evaluation (Addendum)
Anesthesia Evaluation  Patient identified by MRN, date of birth, ID band Patient awake    Reviewed: Allergy & Precautions, H&P , NPO status , Patient's Chart, lab work & pertinent test results  Airway Mallampati: II  Neck ROM: full    Dental   Pulmonary shortness of breath, COPDformer smoker,          Cardiovascular hypertension, + CAD, + Past MI, + CABG, + Peripheral Vascular Disease, +CHF and + DOE + pacemaker     Neuro/Psych dementia    GI/Hepatic GERD-  ,  Endo/Other  diabetes, Type 2  Renal/GU Renal InsufficiencyRenal disease     Musculoskeletal  (+) Arthritis -,   Abdominal   Peds  Hematology   Anesthesia Other Findings   Reproductive/Obstetrics                          Anesthesia Physical Anesthesia Plan  ASA: III  Anesthesia Plan: General   Post-op Pain Management:    Induction: Intravenous  Airway Management Planned: Oral ETT  Additional Equipment:   Intra-op Plan:   Post-operative Plan: Extubation in OR and Possible Post-op intubation/ventilation  Informed Consent: I have reviewed the patients History and Physical, chart, labs and discussed the procedure including the risks, benefits and alternatives for the proposed anesthesia with the patient or authorized representative who has indicated his/her understanding and acceptance.     Plan Discussed with: CRNA, Anesthesiologist and Surgeon  Anesthesia Plan Comments:         Anesthesia Quick Evaluation

## 2013-04-30 NOTE — Progress Notes (Signed)
Patient ID: Colin [name], male   DOB: [birth year], 80 y.o.   MRN: [id number] Vascular Surgery Progress Note  Subjective: The patient had angiography of left leg today for evaluation of gangrene left first toe Patient evaluated yesterday by Dr. Beverely Low duda  Objective:  Filed Vitals:   04/30/13 1530  BP: 126/48  Pulse: 65  Temp:   Resp:        Labs:  Recent Labs Lab 04/30/13 0954  CREATININE 2.80*    Recent Labs Lab 04/30/13 0954  NA 132*  K 4.2  CL 96  BUN 52*  CREATININE 2.80*  GLUCOSE 500*    Recent Labs Lab 04/30/13 0954  HGB 12.6*  HCT 37.0*   No results found for this basename: INR,  in the last 168 hours     Imaging: No results found.  Assessment/Plan:   LOS: 0 days  s/p Procedure(s): LOWER EXTREMITY ANGIOGRAM ABDOMINAL ANGIOGRAM  The patient has left superficial femoral occlusion as well as occlusion of anterior and posterior tibial arteries. Plan to admit the patient today and proceed with left femoral to popliteal bypass tomorrow with Gore-Tex or saphenous vein Dr. Sharol Given Will follow patient and decide timing of transmetatarsal amputation left first toe  Patient received 30 cc of contrast a day during angiogram-we'll check labs in the a.m.   [name], MD 04/30/2013 4:21 PM

## 2013-05-01 ENCOUNTER — Telehealth: Payer: Self-pay | Admitting: Vascular Surgery

## 2013-05-01 ENCOUNTER — Encounter (HOSPITAL_COMMUNITY): Payer: Self-pay | Admitting: Certified Registered Nurse Anesthetist

## 2013-05-01 ENCOUNTER — Encounter (HOSPITAL_COMMUNITY): Payer: Medicare Other | Admitting: Anesthesiology

## 2013-05-01 ENCOUNTER — Encounter (HOSPITAL_COMMUNITY)
Admission: RE | Disposition: A | Discharge status: Skilled Nursing Facility | Payer: Self-pay | Source: Ambulatory Visit | Attending: Vascular Surgery

## 2013-05-01 ENCOUNTER — Inpatient Hospital Stay (HOSPITAL_COMMUNITY): Payer: Medicare Other | Admitting: Anesthesiology

## 2013-05-01 ENCOUNTER — Inpatient Hospital Stay (HOSPITAL_COMMUNITY): Payer: Medicare Other

## 2013-05-01 DIAGNOSIS — I70269 Atherosclerosis of native arteries of extremities with gangrene, unspecified extremity: Secondary | ICD-10-CM

## 2013-05-01 HISTORY — PX: FEMORAL-POPLITEAL BYPASS GRAFT: SHX937

## 2013-05-01 LAB — CBC
HCT: 30 % — ABNORMAL LOW (ref 39.0–52.0)
HCT: 32.2 % — ABNORMAL LOW (ref 39.0–52.0)
HEMOGLOBIN: 9.6 g/dL — AB (ref 13.0–17.0)
HEMOGLOBIN: 10.5 g/dL — AB (ref 13.0–17.0)
MCH: 26.8 pg (ref 26.0–34.0)
MCH: 27.4 pg (ref 26.0–34.0)
MCHC: 32 g/dL (ref 30.0–36.0)
MCHC: 32.6 g/dL (ref 30.0–36.0)
MCV: 83.8 fL (ref 78.0–100.0)
MCV: 84.1 fL (ref 78.0–100.0)
PLATELETS: 302 10*3/uL (ref 150–400)
Platelets: 309 10*3/uL (ref 150–400)
RBC: 3.58 MIL/uL — AB (ref 4.22–5.81)
RBC: 3.83 MIL/uL — AB (ref 4.22–5.81)
RDW: 14.4 % (ref 11.5–15.5)
RDW: 14.6 % (ref 11.5–15.5)
WBC: 6.5 10*3/uL (ref 4.0–10.5)
WBC: 8.5 10*3/uL (ref 4.0–10.5)

## 2013-05-01 LAB — HEMOGLOBIN A1C
Hgb A1c MFr Bld: 15.3 % — ABNORMAL HIGH (ref <5.7)
Mean Plasma Glucose: 392 mg/dL — ABNORMAL HIGH (ref <117)

## 2013-05-01 LAB — CREATININE, SERUM
Creatinine, Ser: 2.13 mg/dL — ABNORMAL HIGH (ref 0.50–1.35)
GFR calc non Af Amer: 27 mL/min — ABNORMAL LOW (ref >90)
GFR, EST AFRICAN AMERICAN: 32 mL/min — AB (ref >90)

## 2013-05-01 LAB — URINALYSIS, ROUTINE W REFLEX MICROSCOPIC
BILIRUBIN URINE: NEGATIVE
GLUCOSE, UA: 250 mg/dL — AB
Hgb urine dipstick: NEGATIVE
KETONES UR: NEGATIVE mg/dL
Leukocytes, UA: NEGATIVE
Nitrite: NEGATIVE
PROTEIN: NEGATIVE mg/dL
Specific Gravity, Urine: 1.022 (ref 1.005–1.030)
Urobilinogen, UA: 0.2 mg/dL (ref 0.0–1.0)
pH: 5.5 (ref 5.0–8.0)

## 2013-05-01 LAB — BASIC METABOLIC PANEL
BUN: 53 mg/dL — ABNORMAL HIGH (ref 6–23)
CALCIUM: 9.2 mg/dL (ref 8.4–10.5)
CO2: 22 mEq/L (ref 19–32)
Chloride: 99 mEq/L (ref 96–112)
Creatinine, Ser: 2.24 mg/dL — ABNORMAL HIGH (ref 0.50–1.35)
GFR calc Af Amer: 30 mL/min — ABNORMAL LOW (ref >90)
GFR, EST NON AFRICAN AMERICAN: 26 mL/min — AB (ref >90)
Glucose, Bld: 279 mg/dL — ABNORMAL HIGH (ref 70–99)
POTASSIUM: 4.1 meq/L (ref 3.7–5.3)
SODIUM: 136 meq/L — AB (ref 137–147)

## 2013-05-01 LAB — GLUCOSE, CAPILLARY
GLUCOSE-CAPILLARY: 184 mg/dL — AB (ref 70–99)
GLUCOSE-CAPILLARY: 227 mg/dL — AB (ref 70–99)
Glucose-Capillary: 255 mg/dL — ABNORMAL HIGH (ref 70–99)
Glucose-Capillary: 308 mg/dL — ABNORMAL HIGH (ref 70–99)

## 2013-05-01 LAB — SURGICAL PCR SCREEN
MRSA, PCR: NEGATIVE
Staphylococcus aureus: NEGATIVE

## 2013-05-01 SURGERY — BYPASS GRAFT FEMORAL-POPLITEAL ARTERY
Anesthesia: General | Site: Leg Upper | Laterality: Left

## 2013-05-01 MED ORDER — FENTANYL CITRATE 0.05 MG/ML IJ SOLN
INTRAMUSCULAR | Status: AC
  Filled 2013-05-01: qty 2

## 2013-05-01 MED ORDER — DOPAMINE-DEXTROSE 3.2-5 MG/ML-% IV SOLN
3.0000 ug/kg/min | INTRAVENOUS | Status: DC
Start: 2013-05-01 — End: 2013-05-03

## 2013-05-01 MED ORDER — EPHEDRINE SULFATE 50 MG/ML IJ SOLN
INTRAMUSCULAR | Status: DC | PRN
  Administered 2013-05-01: 5 mg via INTRAVENOUS
  Administered 2013-05-01: 10 mg via INTRAVENOUS

## 2013-05-01 MED ORDER — HEPARIN SODIUM (PORCINE) 1000 UNIT/ML IJ SOLN
INTRAMUSCULAR | Status: DC | PRN
  Administered 2013-05-01: 6000 [IU] via INTRAVENOUS

## 2013-05-01 MED ORDER — ONDANSETRON HCL 4 MG/2ML IJ SOLN
INTRAMUSCULAR | Status: DC | PRN
  Administered 2013-05-01: 4 mg via INTRAVENOUS

## 2013-05-01 MED ORDER — OXYCODONE HCL 5 MG PO TABS
5.0000 mg | ORAL_TABLET | Freq: Once | ORAL | Status: AC | PRN
  Administered 2013-05-01: 5 mg via ORAL

## 2013-05-01 MED ORDER — FENTANYL CITRATE 0.05 MG/ML IJ SOLN
25.0000 ug | INTRAMUSCULAR | Status: DC | PRN
  Administered 2013-05-01: 50 ug via INTRAVENOUS
  Administered 2013-05-01: 25 ug via INTRAVENOUS
  Administered 2013-05-01: 50 ug via INTRAVENOUS
  Administered 2013-05-01: 25 ug via INTRAVENOUS

## 2013-05-01 MED ORDER — INSULIN ASPART 100 UNIT/ML ~~LOC~~ SOLN
SUBCUTANEOUS | Status: AC
  Filled 2013-05-01: qty 15

## 2013-05-01 MED ORDER — POTASSIUM CHLORIDE CRYS ER 20 MEQ PO TBCR
20.0000 meq | EXTENDED_RELEASE_TABLET | Freq: Every day | ORAL | Status: AC | PRN
  Administered 2013-05-02: 20 meq via ORAL
  Filled 2013-05-01: qty 1

## 2013-05-01 MED ORDER — SODIUM CHLORIDE 0.9 % IV SOLN: 500.0000 mL | Freq: Once | INTRAVENOUS | Status: AC | PRN

## 2013-05-01 MED ORDER — ISOSORBIDE MONONITRATE ER 30 MG PO TB24
30.0000 mg | ORAL_TABLET | Freq: Every day | ORAL | Status: DC
  Administered 2013-05-01 – 2013-05-08 (×8): 30 mg via ORAL
  Filled 2013-05-01 (×8): qty 1

## 2013-05-01 MED ORDER — FENTANYL CITRATE 0.05 MG/ML IJ SOLN
INTRAMUSCULAR | Status: DC | PRN
Start: 2013-05-01 — End: 2013-05-01
  Administered 2013-05-01: 100 ug via INTRAVENOUS

## 2013-05-01 MED ORDER — ROCURONIUM BROMIDE 100 MG/10ML IV SOLN
INTRAVENOUS | Status: DC | PRN
  Administered 2013-05-01: 30 mg via INTRAVENOUS

## 2013-05-01 MED ORDER — LINAGLIPTIN 5 MG PO TABS
5.0000 mg | ORAL_TABLET | Freq: Every day | ORAL | Status: DC
  Administered 2013-05-01 – 2013-05-08 (×7): 5 mg via ORAL
  Filled 2013-05-01 (×8): qty 1

## 2013-05-01 MED ORDER — DEXTROSE 5 % IV SOLN
1.5000 g | Freq: Two times a day (BID) | INTRAVENOUS | Status: AC
  Administered 2013-05-01 – 2013-05-02 (×2): 1.5 g via INTRAVENOUS
  Filled 2013-05-01 (×2): qty 1.5

## 2013-05-01 MED ORDER — OXYCODONE HCL 5 MG/5ML PO SOLN: 5.0000 mg | Freq: Once | ORAL | Status: AC | PRN

## 2013-05-01 MED ORDER — CEPHALEXIN 250 MG PO CAPS
250.0000 mg | ORAL_CAPSULE | Freq: Two times a day (BID) | ORAL | Status: AC
  Administered 2013-05-01 – 2013-05-04 (×6): 250 mg via ORAL
  Filled 2013-05-01 (×7): qty 1

## 2013-05-01 MED ORDER — ARTIFICIAL TEARS OP OINT
TOPICAL_OINTMENT | OPHTHALMIC | Status: DC | PRN
  Administered 2013-05-01: 1 via OPHTHALMIC

## 2013-05-01 MED ORDER — PANTOPRAZOLE SODIUM 40 MG PO TBEC
40.0000 mg | DELAYED_RELEASE_TABLET | Freq: Every day | ORAL | Status: DC
  Administered 2013-05-01 – 2013-05-08 (×8): 40 mg via ORAL
  Filled 2013-05-01 (×9): qty 1

## 2013-05-01 MED ORDER — PROTAMINE SULFATE 10 MG/ML IV SOLN
INTRAVENOUS | Status: DC | PRN
  Administered 2013-05-01: 50 mg via INTRAVENOUS

## 2013-05-01 MED ORDER — GLYCOPYRROLATE 0.2 MG/ML IJ SOLN
INTRAMUSCULAR | Status: DC | PRN
  Administered 2013-05-01: 0.4 mg via INTRAVENOUS

## 2013-05-01 MED ORDER — B COMPLEX-C PO TABS
1.0000 | ORAL_TABLET | Freq: Every day | ORAL | Status: DC
  Administered 2013-05-02 – 2013-05-08 (×7): 1 via ORAL
  Filled 2013-05-01 (×9): qty 1

## 2013-05-01 MED ORDER — ENOXAPARIN SODIUM 30 MG/0.3ML ~~LOC~~ SOLN
30.0000 mg | SUBCUTANEOUS | Status: DC
  Administered 2013-05-02 – 2013-05-08 (×6): 30 mg via SUBCUTANEOUS
  Filled 2013-05-01 (×7): qty 0.3

## 2013-05-01 MED ORDER — EZETIMIBE 10 MG PO TABS
10.0000 mg | ORAL_TABLET | Freq: Every day | ORAL | Status: DC
  Administered 2013-05-01 – 2013-05-08 (×8): 10 mg via ORAL
  Filled 2013-05-01 (×8): qty 1

## 2013-05-01 MED ORDER — DOCUSATE SODIUM 100 MG PO CAPS
100.0000 mg | ORAL_CAPSULE | Freq: Every day | ORAL | Status: DC
  Administered 2013-05-03 – 2013-05-08 (×6): 100 mg via ORAL
  Filled 2013-05-01 (×6): qty 1

## 2013-05-01 MED ORDER — ONDANSETRON HCL 4 MG/2ML IJ SOLN: 4.0000 mg | Freq: Four times a day (QID) | INTRAMUSCULAR | Status: DC | PRN

## 2013-05-01 MED ORDER — SODIUM CHLORIDE 0.9 % IV SOLN
INTRAVENOUS | Status: DC
Start: 2013-05-01 — End: 2013-05-03
  Administered 2013-05-01 – 2013-05-02 (×2): via INTRAVENOUS

## 2013-05-01 MED ORDER — CARVEDILOL 6.25 MG PO TABS
6.2500 mg | ORAL_TABLET | Freq: Two times a day (BID) | ORAL | Status: DC
  Administered 2013-05-01 – 2013-05-08 (×12): 6.25 mg via ORAL
  Filled 2013-05-01 (×16): qty 1

## 2013-05-01 MED ORDER — LACTATED RINGERS IV SOLN
INTRAVENOUS | Status: DC | PRN
  Administered 2013-05-01 (×2): via INTRAVENOUS

## 2013-05-01 MED ORDER — IOHEXOL 300 MG/ML  SOLN
INTRAMUSCULAR | Status: DC | PRN
  Administered 2013-05-01: 30 mL

## 2013-05-01 MED ORDER — LIDOCAINE HCL (CARDIAC) 20 MG/ML IV SOLN
INTRAVENOUS | Status: DC | PRN
  Administered 2013-05-01: 80 mg via INTRAVENOUS

## 2013-05-01 MED ORDER — ASPIRIN EC 325 MG PO TBEC
325.0000 mg | DELAYED_RELEASE_TABLET | Freq: Every day | ORAL | Status: DC
  Administered 2013-05-01 – 2013-05-08 (×7): 325 mg via ORAL
  Filled 2013-05-01 (×8): qty 1

## 2013-05-01 MED ORDER — FUROSEMIDE 40 MG PO TABS
40.0000 mg | ORAL_TABLET | Freq: Every day | ORAL | Status: DC
  Administered 2013-05-01 – 2013-05-08 (×7): 40 mg via ORAL
  Filled 2013-05-01 (×8): qty 1

## 2013-05-01 MED ORDER — HYDRALAZINE HCL 10 MG PO TABS
10.0000 mg | ORAL_TABLET | Freq: Three times a day (TID) | ORAL | Status: DC
  Administered 2013-05-01 – 2013-05-08 (×18): 10 mg via ORAL
  Filled 2013-05-01 (×24): qty 1

## 2013-05-01 MED ORDER — SODIUM CHLORIDE 0.9 % IR SOLN
Status: DC | PRN
  Administered 2013-05-01: 08:00:00

## 2013-05-01 MED ORDER — NEOSTIGMINE METHYLSULFATE 1 MG/ML IJ SOLN
INTRAMUSCULAR | Status: DC | PRN
  Administered 2013-05-01: 3 mg via INTRAVENOUS

## 2013-05-01 MED ORDER — GUAIFENESIN-DM 100-10 MG/5ML PO SYRP
15.0000 mL | ORAL_SOLUTION | ORAL | Status: DC | PRN
  Administered 2013-05-02: 15 mL via ORAL
  Filled 2013-05-01: qty 15

## 2013-05-01 MED ORDER — OXYCODONE HCL 5 MG PO TABS
ORAL_TABLET | ORAL | Status: AC
  Filled 2013-05-01: qty 1

## 2013-05-01 MED ORDER — SODIUM POLYSTYRENE SULFONATE 15 GM/60ML PO SUSP: 15.0000 g | ORAL | Status: DC

## 2013-05-01 MED ORDER — PROPOFOL 10 MG/ML IV BOLUS
INTRAVENOUS | Status: DC | PRN
  Administered 2013-05-01: 120 mg via INTRAVENOUS

## 2013-05-01 MED ORDER — 0.9 % SODIUM CHLORIDE (POUR BTL) OPTIME
TOPICAL | Status: DC | PRN
  Administered 2013-05-01: 2000 mL

## 2013-05-01 SURGICAL SUPPLY — 59 items
ADH SKN CLS APL DERMABOND .7 (GAUZE/BANDAGES/DRESSINGS) ×2
BANDAGE ESMARK 6X9 LF (GAUZE/BANDAGES/DRESSINGS) IMPLANT
BNDG CMPR 9X6 STRL LF SNTH (GAUZE/BANDAGES/DRESSINGS)
BNDG ESMARK 6X9 LF (GAUZE/BANDAGES/DRESSINGS)
BOOT SUTURE AID YELLOW STND (SUTURE) IMPLANT
CANISTER SUCTION 2500CC (MISCELLANEOUS) ×3 IMPLANT
CLIP TI MEDIUM 24 (CLIP) ×3 IMPLANT
CLIP TI WIDE RED SMALL 24 (CLIP) ×3 IMPLANT
COVER SURGICAL LIGHT HANDLE (MISCELLANEOUS) ×3 IMPLANT
DECANTER SPIKE VIAL GLASS SM (MISCELLANEOUS) IMPLANT
DERMABOND ADVANCED (GAUZE/BANDAGES/DRESSINGS) ×4
DERMABOND ADVANCED .7 DNX12 (GAUZE/BANDAGES/DRESSINGS) ×1 IMPLANT
DRAIN SNY 10X20 3/4 PERF (WOUND CARE) IMPLANT
DRAPE WARM FLUID 44X44 (DRAPE) ×3 IMPLANT
DRAPE X-RAY CASS 24X20 (DRAPES) IMPLANT
ELECT REM PT RETURN 9FT ADLT (ELECTROSURGICAL) ×3
ELECTRODE REM PT RTRN 9FT ADLT (ELECTROSURGICAL) ×1 IMPLANT
EVACUATOR SILICONE 100CC (DRAIN) IMPLANT
GLOVE BIO SURGEON STRL SZ 6.5 (GLOVE) ×2 IMPLANT
GLOVE BIO SURGEONS STRL SZ 6.5 (GLOVE) ×2
GLOVE BIOGEL PI IND STRL 6.5 (GLOVE) IMPLANT
GLOVE BIOGEL PI IND STRL 7.0 (GLOVE) IMPLANT
GLOVE BIOGEL PI INDICATOR 6.5 (GLOVE) ×2
GLOVE BIOGEL PI INDICATOR 7.0 (GLOVE) ×4
GLOVE ECLIPSE 6.5 STRL STRAW (GLOVE) ×2 IMPLANT
GLOVE SS BIOGEL STRL SZ 7 (GLOVE) ×1 IMPLANT
GLOVE SUPERSENSE BIOGEL SZ 7 (GLOVE) ×2
GLOVE SURG SS PI 6.5 STRL IVOR (GLOVE) ×4 IMPLANT
GOWN STRL REUS W/ TWL LRG LVL3 (GOWN DISPOSABLE) ×3 IMPLANT
GOWN STRL REUS W/ TWL XL LVL3 (GOWN DISPOSABLE) IMPLANT
GOWN STRL REUS W/TWL LRG LVL3 (GOWN DISPOSABLE) ×9
GOWN STRL REUS W/TWL XL LVL3 (GOWN DISPOSABLE) ×6
GRAFT PROPATEN THIN WALL 6X40 (Vascular Products) ×2 IMPLANT
INSERT FOGARTY SM (MISCELLANEOUS) ×3 IMPLANT
KIT BASIN OR (CUSTOM PROCEDURE TRAY) ×3 IMPLANT
KIT ROOM TURNOVER OR (KITS) ×3 IMPLANT
NS IRRIG 1000ML POUR BTL (IV SOLUTION) ×6 IMPLANT
PACK PERIPHERAL VASCULAR (CUSTOM PROCEDURE TRAY) ×3 IMPLANT
PAD ARMBOARD 7.5X6 YLW CONV (MISCELLANEOUS) ×6 IMPLANT
PADDING CAST COTTON 6X4 STRL (CAST SUPPLIES) IMPLANT
SET COLLECT BLD 21X3/4 12 (NEEDLE) IMPLANT
STOPCOCK 4 WAY LG BORE MALE ST (IV SETS) IMPLANT
SUT PROLENE 6 0 BV (SUTURE) ×2 IMPLANT
SUT PROLENE 6 0 CC (SUTURE) ×6 IMPLANT
SUT PROLENE 7 0 BV 1 (SUTURE) IMPLANT
SUT PROLENE 7 0 BV1 MDA (SUTURE) IMPLANT
SUT SILK 2 0 SH (SUTURE) ×3 IMPLANT
SUT SILK 3 0 (SUTURE)
SUT SILK 3-0 18XBRD TIE 12 (SUTURE) IMPLANT
SUT VIC AB 2-0 CTX 36 (SUTURE) ×6 IMPLANT
SUT VIC AB 3-0 SH 27 (SUTURE) ×6
SUT VIC AB 3-0 SH 27X BRD (SUTURE) ×2 IMPLANT
SUT VICRYL 4-0 PS2 18IN ABS (SUTURE) ×2 IMPLANT
TOWEL OR 17X24 6PK STRL BLUE (TOWEL DISPOSABLE) ×6 IMPLANT
TOWEL OR 17X26 10 PK STRL BLUE (TOWEL DISPOSABLE) ×6 IMPLANT
TRAY FOLEY CATH 16FRSI W/METER (SET/KITS/TRAYS/PACK) ×3 IMPLANT
TUBING EXTENTION W/L.L. (IV SETS) IMPLANT
UNDERPAD 30X30 INCONTINENT (UNDERPADS AND DIAPERS) ×3 IMPLANT
WATER STERILE IRR 1000ML POUR (IV SOLUTION) ×3 IMPLANT

## 2013-05-01 NOTE — Clinical Documentation Improvement (Signed)
Possible Clinical Conditions?  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

## 2013-05-01 NOTE — H&P (View-Only) (Signed)
Patient ID: Colin [name], male   DOB: January 23, 1929, 80 y.o.   MRN: [id number] Vascular Surgery Progress Note  Subjective: The patient had angiography of left leg today for evaluation of gangrene left first toe Patient evaluated yesterday by Dr. Beverely Low duda  Objective:  Filed Vitals:   04/30/13 1530  BP: 126/48  Pulse: 65  Temp:   Resp:        Labs:  Recent Labs Lab 04/30/13 0954  CREATININE 2.80*    Recent Labs Lab 04/30/13 0954  NA 132*  K 4.2  CL 96  BUN 52*  CREATININE 2.80*  GLUCOSE 500*    Recent Labs Lab 04/30/13 0954  HGB 12.6*  HCT 37.0*   No results found for this basename: INR,  in the last 168 hours     Imaging: No results found.  Assessment/Plan:   LOS: 0 days  s/p Procedure(s): LOWER EXTREMITY ANGIOGRAM ABDOMINAL ANGIOGRAM  The patient has left superficial femoral occlusion as well as occlusion of anterior and posterior tibial arteries. Plan to admit the patient today and proceed with left femoral to popliteal bypass tomorrow with Gore-Tex or saphenous vein Dr. Sharol Given Will follow patient and decide timing of transmetatarsal amputation left first toe  Patient received 30 cc of contrast a day during angiogram-we'll check labs in the a.m.   [name], MD 04/30/2013 4:21 PM

## 2013-05-01 NOTE — CV Procedure (Signed)
OPERATIVE REPORT  Date of Surgery: 04/30/2013 - 05/01/2013  Surgeon: [name], MD  Assistant: Dionicio Stall   Pre-op Diagnosis: Critical limb ischemia-left with gangrene first toe secondary to femoral popliteal and tibial occlusive disease  Post-op Diagnosis: Same  Procedure: Procedure(s): LEFT FEMORAL TO ABOVE KNEE POPLITEAL ARTERY BYPASS GRAFT USING 6 MM GORE-TEX-PROPATEN WITH INTRAOPERATIVE ARTEROGRAM  Anesthesia: General  EBL: 123XX123 cc  Complications: None  Patient was taken to the operating room placed in the supine position at which time satisfactory general endotracheal anesthesia was minister. The left leg was prepped Betadine scrub and solution draped in routine sterile manner. The gangrenous first toe on the left foot and the entire foot were isolated with a sterile isolation bag. Short longitudinal incision was made in inguinal area the common superficial and profunda femoris arteries dissected free encircled with Vesseloops. There was an excellent pulse in the common femoral artery. Medial incision was made above the knee and the popliteal artery was exposed where it was a widely patent relatively normal-appearing vessel. It was patent across the knee joint on the preoperative angiogram with one-vessel runoff through the peroneal artery popliteal artery was exposed dissected free and a subfascial anatomic tunnel created and a 6 mm Gore-Tex-propaten graft was delivered through the tunnel the patient was heparinized. Popliteal artery was occluded proximally and distally with vascular clamps opened longitudinally the 15 blade extended with the Potts scissors. There was excellent back bleeding it would easily accept a 4 mm dilator. Gore-Tex was spatulated and anastomosed end to side with 6-0 Prolene. Clamps were then released and attention turned to the inguinal area where the femoral vessels were similarly occluded with vascular clamps. Longitudinal opening made in the common femoral  artery 15 blade extended with Potts scissors. There was excellent inflow. Gore-Tex was spatulated and anastomosed end to side with 6-0 Prolene. Clamps were released there was excellent pulse in the graft and the popliteal artery distally. I was unable to hear flow in the peroneal artery distally therefore I did shoot an intraoperative arteriogram with limited contrast-30 cc which revealed a widely patent anastomosis with one-vessel runoff as described. Protamine was given to reverse the heparin following adequate hemostasis wounds were closed in layers with Vicryl in a subcuticular fashion with Dermabond patient taken to the recovery room in stable condition  Procedure Details:   [name], MD 05/01/2013 9:45 AM

## 2013-05-01 NOTE — Telephone Encounter (Addendum)
Message copied by Gena Fray on Thu May 01, 2013  2:12 PM ------      Message from: [place], [place] K      Created: Thu May 01, 2013 10:20 AM      Regarding: Schedule                   ----- Message -----         From: Gabriel Earing, PA-C         Sent: 05/01/2013   9:20 AM           To: [facility] Charge Pool            S/p left fem pop bypass with propaten 05/01/13.  F/u with Dr. Kellie Simmering in 2 weeks.            Thanks,      Samantha ------  05/01/13: spoke with pts wife re appt, dpm

## 2013-05-01 NOTE — Clinical Documentation Improvement (Signed)
Possible Clinical Conditions?   Hyponatremia hypernatremia                                Other Condition                 Supporting Information: Diagnostics: Na+: 132 (4/8); 136 (4/8); 136 (4/9) Treatment: monitor, evaluate Thank You, Joya Salm ,RN Clinical Documentation Specialist:  [place] Information Management

## 2013-05-01 NOTE — Anesthesia Procedure Notes (Signed)
Procedure Name: Intubation Date/Time: 05/01/2013 7:36 AM Performed by: Ned Grace Pre-anesthesia Checklist: Patient identified, Timeout performed, Emergency Drugs available, Suction available and Patient being monitored Patient Re-evaluated:Patient Re-evaluated prior to inductionOxygen Delivery Method: Circle system utilized Preoxygenation: Pre-oxygenation with 100% oxygen Intubation Type: IV induction Ventilation: Mask ventilation without difficulty and Oral airway inserted - appropriate to patient size Laryngoscope Size: Mac and 4 Grade View: Grade I Tube type: Oral Tube size: 7.5 mm Number of attempts: 1 Airway Equipment and Method: Stylet and Oral airway Placement Confirmation: ETT inserted through vocal cords under direct vision,  positive ETCO2 and breath sounds checked- equal and bilateral Secured at: 23 cm Tube secured with: Tape Dental Injury: Teeth and Oropharynx as per pre-operative assessment

## 2013-05-01 NOTE — Clinical Documentation Improvement (Signed)
Possible Clinical Conditions?  Diabetes Type 2______Controlled or uncontrolled  Manifestations:  DM retinopathy  DM PVD DM neuropathy   DM nephropathy  Associated conditions: DM gangrene DM osteomyelitis DM skin ulcer Other Condition  Supporting Information: Risk Factors:diabetes, htn,  Signs & Symptoms:gangrene, ulcer Diagnostics:Glucose 272.0 > 200 and A1C 15.3 > 7.0   Glucose A1C   272.0  15.3 %   mg/dL   Threshold: 200  7.0   Order #: [id number]  Lab Service: COMPREHENSIVE METABOLIC PANEL GLYCOSYLATED HEMOGLOBIN  Ordered on: 04/30/2013 at 4:17 PM 04/30/2013 at 4:17 PM  Observed on: 04/30/2013 at 9:55 PM 05/01/2013 at 3:21 AM  Treatment: insulin (novolog) Thank You, Joya Salm ,RN Clinical Documentation Specialist:  [place] Information Management

## 2013-05-01 NOTE — Transfer of Care (Signed)
Immediate Anesthesia Transfer of Care Note  Patient: Colin [name]  Procedure(s) Performed: Procedure(s): LEFT FEMORAL TO ABOVE KNEE POPLITEAL ARTERY BYPASS GRAFT WITH INTRAOPERATIVE ARTEROGRAM (Left)  Patient Location: PACU  Anesthesia Type:General  Level of Consciousness: awake, alert , oriented and patient cooperative  Airway & Oxygen Therapy: Patient connected to nasal cannula oxygen  Post-op Assessment: Report given to PACU RN and Post -op Vital signs reviewed and stable  Post vital signs: Reviewed and stable  Complications: No apparent anesthesia complications

## 2013-05-01 NOTE — Interval H&P Note (Signed)
History and Physical Interval Note:  05/01/2013 7:17 AM  Colin [name]  has presented today for surgery, with the diagnosis of Critical limb ischemia  The various methods of treatment have been discussed with the patient and family. After consideration of risks, benefits and other options for treatment, the patient has consented to  Procedure(s): BYPASS GRAFT FEMORAL-POPLITEAL ARTERY (Left) as a surgical intervention .  The patient's history has been reviewed, patient examined, no change in status, stable for surgery.  I have reviewed the patient's chart and labs.  Questions were answered to the patient's satisfaction.     Mal Misty

## 2013-05-01 NOTE — Progress Notes (Signed)
Utilization review completed. Laken Lobato, RN, BSN. 

## 2013-05-01 NOTE — Anesthesia Postprocedure Evaluation (Signed)
Anesthesia Post Note  Patient: Colin [name]  Procedure(s) Performed: Procedure(s) (LRB): LEFT FEMORAL TO ABOVE KNEE POPLITEAL ARTERY BYPASS GRAFT WITH INTRAOPERATIVE ARTEROGRAM (Left)  Anesthesia type: General  Patient location: PACU  Post pain: Pain level controlled and Adequate analgesia  Post assessment: Post-op Vital signs reviewed, Patient's Cardiovascular Status Stable, Respiratory Function Stable, Patent Airway and Pain level controlled  Last Vitals:  Filed Vitals:   05/01/13 1030  BP: 148/56  Pulse: 69  Temp:   Resp: 18    Post vital signs: Reviewed and stable  Level of consciousness: awake, alert  and oriented  Complications: No apparent anesthesia complications

## 2013-05-02 LAB — BASIC METABOLIC PANEL
BUN: 43 mg/dL — ABNORMAL HIGH (ref 6–23)
CO2: 22 mEq/L (ref 19–32)
CREATININE: 2.07 mg/dL — AB (ref 0.50–1.35)
Calcium: 8.6 mg/dL (ref 8.4–10.5)
Chloride: 97 mEq/L (ref 96–112)
GFR calc Af Amer: 33 mL/min — ABNORMAL LOW (ref >90)
GFR calc non Af Amer: 28 mL/min — ABNORMAL LOW (ref >90)
Glucose, Bld: 228 mg/dL — ABNORMAL HIGH (ref 70–99)
Potassium: 3.8 mEq/L (ref 3.7–5.3)
SODIUM: 134 meq/L — AB (ref 137–147)

## 2013-05-02 LAB — GLUCOSE, CAPILLARY
GLUCOSE-CAPILLARY: 163 mg/dL — AB (ref 70–99)
GLUCOSE-CAPILLARY: 264 mg/dL — AB (ref 70–99)
GLUCOSE-CAPILLARY: 241 mg/dL — AB (ref 70–99)
Glucose-Capillary: 232 mg/dL — ABNORMAL HIGH (ref 70–99)

## 2013-05-02 LAB — CBC
HCT: 29.3 % — ABNORMAL LOW (ref 39.0–52.0)
Hemoglobin: 9.3 g/dL — ABNORMAL LOW (ref 13.0–17.0)
MCH: 26.8 pg (ref 26.0–34.0)
MCHC: 31.7 g/dL (ref 30.0–36.0)
MCV: 84.4 fL (ref 78.0–100.0)
Platelets: 285 10*3/uL (ref 150–400)
RBC: 3.47 MIL/uL — ABNORMAL LOW (ref 4.22–5.81)
RDW: 14.5 % (ref 11.5–15.5)
WBC: 7.9 10*3/uL (ref 4.0–10.5)

## 2013-05-02 NOTE — Progress Notes (Signed)
Patient ID: Colin [name], male   DOB: 1928-12-15, 80 y.o.   MRN: [id number] Vascular Surgery Progress Note  Subjective: 1 day post left femoral-popliteal bypass graft with Gore-Tex for gangrene left first toe. Patient has one vessel runoff through peroneal artery. Patient states pain and left foot is unchanged. No specific complaints today.  Objective:  Filed Vitals:   05/02/13 0700  BP:   Pulse:   Temp: 99.5 F (37.5 C)  Resp:     General alert and oriented x3 Left popliteal pulse palpable at 2-3+ dressing intact left foot   Labs:  Recent Labs Lab 05/01/13 0349 05/01/13 1616 05/02/13 0057  CREATININE 2.24* 2.13* 2.07*    Recent Labs Lab 04/30/13 2100 05/01/13 0349 05/01/13 1616 05/02/13 0057  NA 136* 136*  --  134*  K 4.1 4.1  --  3.8  CL 96 99  --  97  CO2 23 22  --  22  BUN 53* 53*  --  43*  CREATININE 2.25* 2.24* 2.13* 2.07*  GLUCOSE 272* 279*  --  228*  CALCIUM 9.4 9.2  --  8.6    Recent Labs Lab 05/01/13 0349 05/01/13 1616 05/02/13 0057  WBC 6.5 8.5 7.9  HGB 9.6* 10.5* 9.3*  HCT 30.0* 32.2* 29.3*  PLT 302 309 285    Recent Labs Lab 04/30/13 2100  INR 1.05    I/O last 3 completed shifts: In: 1652.5 [P.O.:600; I.V.:1052.5] Out: 1550 [id number]  Imaging: Dg Chest Portable 1 View  04/30/2013   CLINICAL DATA:  Cardiac pacer.  EXAM: PORTABLE CHEST - 1 VIEW  COMPARISON:  DG CHEST 2V dated 03/13/2013  FINDINGS: Mediastinum and hilar structures are normal. Mild cardiomegaly. No pulmonary venous congestion. Cardiac pacer noted with lead tips in stable position. Stable elevation of both hemidiaphragms. No pleural effusion is identified. No focal pulmonary infiltrate identified. No pneumothorax. Low lung volumes.  IMPRESSION: 1. Prior CABG. Mild cardiomegaly. Cardiac pacer noted with lead tips in right atrium right ventricle. No and CHF. 2. No acute cardiopulmonary disease. Low lung volumes with stable elevation of both hemidiaphragms.   Electronically  Signed   By: Marcello Moores  Register   On: 04/30/2013 19:18   Dg Ang/ext/uni/or Left  05/01/2013   CLINICAL DATA:  Peripheral vascular disease, intraoperative angiogram following femoral popliteal bypass  EXAM: LEFT ANG/EXT/UNI/ OR  TECHNIQUE: Single View left lower extremity angiogram inferiorly  CONTRAST:  See operative report  FLUOROSCOPY TIME:  See operative report  COMPARISON:  None available  FINDINGS: Portable intraoperative angiogram performed of the left lower extremity including the above knee distal bypass anastomosis and the peripheral runoff. The distal above knee popliteal bypass anastomosis patent. Popliteal artery is patent. Atherosclerotic change noted of the popliteal artery without occlusion or stenosis. Single dominant runoff vessel noted.  IMPRESSION: Patent distal bypass anastomosis. Native popliteal artery noted with single vessel runoff.   Electronically Signed   By: Daryll Brod M.D.   On: 05/01/2013 13:22    Assessment/Plan:  POD #1  LOS: 2 days  s/p Procedure(s): LEFT FEMORAL TO ABOVE KNEE POPLITEAL ARTERY BYPASS GRAFT WITH INTRAOPERATIVE ARTEROGRAM  Patient doing well 1 day post left femoral-popliteal bypass graft for gangrene left first toe Creatinine stable at 2.0 Good pulse and bypass graft Dr. Fredia Sorrow to plans amputation left first toe possible transmit on Monday  Plan transfer to [place] today   [name], MD 05/02/2013 7:55 AM

## 2013-05-02 NOTE — Evaluation (Signed)
Physical Therapy Evaluation Patient Details Name: Colin [name] MRN: [id number] DOB: January 17, 1929 Today's Date: 05/02/2013   History of Present Illness  1 day post left femoral-popliteal bypass graft with Gore-Tex for gangrene left first toe.  Clinical Impression  Pt tolerating ambulation well this date. Suspect pt to progress to supervision level for safe d/c home with spouse however pt may have L great toe amp on Monday. Acute PT to re-assess pt mobility and d/c plans after surgery.     Follow Up Recommendations No PT follow up;Supervision/Assistance - 24 hour    Equipment Recommendations  None recommended by PT (pt has RW)    Recommendations for Other Services       Precautions / Restrictions Precautions Precautions: Fall Precaution Comments: R transmetarsal amp, L ganrene big toe Restrictions Weight Bearing Restrictions: No      Mobility  Bed Mobility Overal bed mobility: Needs Assistance Bed Mobility: Supine to Sit     Supine to sit: Min assist     General bed mobility comments: for trunk elevation, indep with LE management  Transfers Overall transfer level: Needs assistance Equipment used: Rolling walker (2 wheeled) Transfers: Sit to/from Stand Sit to Stand: Min assist         General transfer comment: v/c's for safe hand placement  Ambulation/Gait Ambulation/Gait assistance: Min assist Ambulation Distance (Feet): 50 Feet Assistive device: Rolling walker (2 wheeled) Gait Pattern/deviations: Step-through pattern;Antalgic Gait velocity: slow   General Gait Details: pt mildly unsteady due to L LE surgery and h/o R transmetarsal amp  Stairs            Wheelchair Mobility    Modified Rankin (Stroke Patients Only)       Balance Overall balance assessment:  (requires RW for safe amb)                                           Pertinent Vitals/Pain Reports L LE discomfort.    Home Living Family/patient expects to be  discharged to:: Unsure Living Arrangements: Spouse/significant other               Additional Comments: pt lives in 1 story home with spouse and mod I with use of RW.    Prior Function Level of Independence: Independent with assistive device(s)         Comments: uses RW     Hand Dominance   Dominant Hand: Right    Extremity/Trunk Assessment   Upper Extremity Assessment: Overall WFL for tasks assessed           Lower Extremity Assessment: RLE deficits/detail;LLE deficits/detail RLE Deficits / Details: generalized weakness LLE Deficits / Details: full ROM, no MMT due to pain from surgery  Cervical / Trunk Assessment: Normal  Communication   Communication: No difficulties  Cognition Arousal/Alertness: Awake/alert Behavior During Therapy: WFL for tasks assessed/performed Overall Cognitive Status: Within Functional Limits for tasks assessed (however per RN pt with early onset of dementia)                      General Comments General comments (skin integrity, edema, etc.): pt with loose stool during amb,assisted to [facility], pt dependent for hygiene    Exercises        Assessment/Plan    PT Assessment Patient needs continued PT services  PT Diagnosis Difficulty walking   PT Problem List Decreased strength;Decreased  activity tolerance;Decreased balance;Decreased mobility  PT Treatment Interventions DME instruction;Gait training;Functional mobility training;Therapeutic activities;Therapeutic exercise   PT Goals (Current goals can be found in the Care Plan section) Acute Rehab PT Goals PT Goal Formulation: With patient Time For Goal Achievement: 05/16/13 Potential to Achieve Goals: Good    Frequency Min 3X/week   Barriers to discharge        Co-evaluation               End of Session Equipment Utilized During Treatment: Gait belt Activity Tolerance: Patient tolerated treatment well Patient left: in chair;with call bell/phone within  reach;with family/visitor present Nurse Communication: Mobility status         Time: 0922-0943 PT Time Calculation (min): 21 min   Charges:   PT Evaluation $Initial PT Evaluation Tier I: 1 Procedure PT Treatments $Gait Training: 8-22 mins   PT G Codes:          Loyalty Arentz M Eleri Ruben 05/02/2013, 11:43 AM

## 2013-05-02 NOTE — Evaluation (Signed)
Occupational Therapy Evaluation Patient Details Name: Colin [name] MRN: [id number] DOB: [birth year] Today's Date: 05/02/2013    History of Present Illness 1 day post left femoral-popliteal bypass graft with Gore-Tex for gangrene left first toe.  Pt with h/o dementia. Pt states he is independent at baseline with use of RW and lives with his wife.  However, pt unclear how much assistance wife can provide at home.   Clinical Impression   Pt s/p left fem-pop BPG.  May have L great toe amp on Monday. Pt requiring assist for functional mobility and standing components of ADLs.  Pt also reports h/o falls at home.  OT to continue to follow acutely.  Will recommend HHOT for d/c plan if family can provide 24/7 supervision/assist.      Follow Up Recommendations  Home health OT;Supervision/Assistance - 24 hour    Equipment Recommendations  None recommended by OT    Recommendations for Other Services       Precautions / Restrictions Precautions Precautions: Fall Precaution Comments: R transmetarsal amp, L ganrene big toe      Mobility Bed Mobility               General bed mobility comments: not assessed- pt up in chair  Transfers Overall transfer level: Needs assistance Equipment used: Rolling walker (2 wheeled) Transfers: Sit to/from Stand Sit to Stand: Mod assist;Min assist         General transfer comment: Pt stood x2 trials.  Pt initially requiring mod assist for power up and steadying.  On second trial. pt stood with min assist.  Pt presenting with LOB posteriorly both times.  VC's for hand placement and sequencing.    Balance Overall balance assessment: Needs assistance         Standing balance support: Bilateral upper extremity supported Standing balance-Leahy Scale: Poor Standing balance comment: Bil UE support with RW                            ADL Overall ADL's : Needs assistance/impaired             Lower Body Bathing: Moderate  assistance;Sit to/from stand       Lower Body Dressing: Moderate assistance;Sit to/from stand   Toilet Transfer: Minimal assistance;Ambulation           Functional mobility during ADLs: Minimal assistance;Rolling walker General ADL Comments: Pt able to don/doff left sock with incr time but required assist with right sock.  Pt with decreased balance and posterior sway in standing. Requries assist for standing components of ADLs. Pt ambulated with RW ~4 ft but then c/o dizziness. Pt returned to sitting in recliner and BP checked- reading 91/47. RN made aware.  Pt states he typically wears a "special shoe" on right foot (sounds like Darco based on pt description) but no shoe present in room during eval.     Vision                     Perception     Praxis      Pertinent Vitals/Pain BP reading 91/47 sitting- measured after ambulating 56ft. Pt c/o dizziness. RN made aware.     Hand Dominance Right   Extremity/Trunk Assessment Upper Extremity Assessment Upper Extremity Assessment: Overall WFL for tasks assessed           Communication Communication Communication: No difficulties   Cognition Arousal/Alertness: Awake/alert Behavior During Therapy: WFL for tasks assessed/performed Overall  Cognitive Status: History of cognitive impairments - at baseline Area of Impairment: Orientation;Memory;Problem solving Orientation Level: Disoriented to;Time   Memory: Decreased short-term memory       Problem Solving: Slow processing;Decreased initiation;Requires verbal cues General Comments: H/o dementia.  Incr time for processing how to scoot to edge of chair.  No family present to determine pt's baseline.   General Comments       Exercises       Shoulder Instructions      Home Living Family/patient expects to be discharged to:: Private residence Living Arrangements: Spouse/significant other Available Help at Discharge: Family;Available 24 hours/day Type of Home:  House Home Access: Level entry     Home Layout: One level     Bathroom Shower/Tub: Occupational psychologist: Standard     Home Equipment: Shower seat;Bedside commode;Walker - 2 wheels   Additional Comments: Pt lives with wife but is unsure how much physical assist she can provide.  Pt unclear at times regarding set up of home and DME.  Pt also reports h/o falls, stating most recent fall was 2 weeks ago in his hallway at home.      Prior Functioning/Environment Level of Independence: Independent with assistive device(s)             OT Diagnosis: Generalized weakness;Cognitive deficits;Acute pain   OT Problem List: Decreased strength;Decreased activity tolerance;Impaired balance (sitting and/or standing);Decreased cognition;Decreased safety awareness;Decreased knowledge of use of DME or AE;Pain   OT Treatment/Interventions: Self-care/ADL training;DME and/or AE instruction;Therapeutic activities;Patient/family education;Balance training;Cognitive remediation/compensation    OT Goals(Current goals can be found in the care plan section) Acute Rehab OT Goals Patient Stated Goal: to return home OT Goal Formulation: With patient Time For Goal Achievement: 05/16/13 Potential to Achieve Goals: Good  OT Frequency: Min 2X/week   Barriers to D/C: Other (comment) (unsure if wife can provide physical assist)          Co-evaluation              End of Session Equipment Utilized During Treatment: Gait belt;Rolling walker Nurse Communication: Mobility status (BP reading)  Activity Tolerance:  (limited by pt c/o dizziness) Patient left: in chair;with call bell/phone within reach   Time: 1250-1320 OT Time Calculation (min): 30 min Charges:  OT General Charges $OT Visit: 1 Procedure OT Evaluation $Initial OT Evaluation Tier I: 1 Procedure OT Treatments $Self Care/Home Management : 8-22 mins G-Codes:    Luther Bradley 05/22/2013, 1:48 PM 05-22-13 Luther Bradley OTR/L Pager [phone] Office [phone]

## 2013-05-02 NOTE — Progress Notes (Signed)
Pt to [facility] to 2W-10, VSS, called report. Left msg w/family.

## 2013-05-02 NOTE — Consult Note (Signed)
Reason for Consult: Dry gangrene left great toe Referring Physician: Dr. Murrell Redden Mcsorley is an 80 y.o. male.  HPI: Patient is a 80 year old gentleman who is status post revascularization for the left lower extremity who initially presented with gangrenous changes to the great toe.  Past Medical History  Diagnosis Date  . Coronary artery disease     Status post CABG in 2004  . Peripheral vascular disease     Status post right femoropopliteal bypass  . Dyslipidemia   . Diabetes mellitus with peripheral artery disease   . Hypertension   . Chronic renal insufficiency     creatinine around 2.4  . CHF (congestive heart failure) 2007    ef 38%  . History of CVA (cerebrovascular accident)   . Cardiomyopathy   . COPD (chronic obstructive pulmonary disease)   . History of anemia   . Myocardial infarction   . Shortness of breath   . Pacemaker   . Pneumonia   . Anginal pain   . Dementia   . GERD (gastroesophageal reflux disease)   . Arthritis     Past Surgical History  Procedure Laterality Date  . Insert / replace / remove pacemaker  05/17/2005    [facility] [facility] dual chamber ddd mode   . Coronary artery bypass graft  4 of 2004    4 vessel bypass  . Femoral bypass  july 2006    right with right great toe amputation  . Cardiac catheterization  04/29/2002    Dr. Glade Lloyd  . Esophagogastroduodenoscopy  07/21/2011    Procedure: ESOPHAGOGASTRODUODENOSCOPY (EGD);  Surgeon: Beryle Beams, MD;  Location: [facility];  Service: Endoscopy;  Laterality: N/A;  . Coronary angioplasty      Family History  Problem Relation Age of Onset  . Hyperlipidemia Mother   . Heart disease Father   . Diabetes    . Diabetes Sister   . Hypertension Sister   . Hyperlipidemia Sister     Social History:  reports that he quit smoking about 10 years ago. His smoking use included Cigarettes. He has a 50 pack-year smoking history. He has never used smokeless tobacco. He reports that he does not drink  alcohol or use illicit drugs.  Allergies:  Allergies  Allergen Reactions  . Lisinopril Other (See Comments)    REACTION:  Elevated potassium,renal insuf    Medications: I have reviewed the patient's current medications.  Results for orders placed during the hospital encounter of 04/30/13 (from the past 48 hour(s))  GLUCOSE, CAPILLARY     Status: Abnormal   Collection Time    04/30/13  9:02 AM      Result Value Ref Range   Glucose-Capillary 457 (*) 70 - 99 mg/dL  POCT I-STAT, CHEM 8     Status: Abnormal   Collection Time    04/30/13  9:54 AM      Result Value Ref Range   Sodium 132 (*) 137 - 147 mEq/L   Potassium 4.2  3.7 - 5.3 mEq/L   Chloride 96  96 - 112 mEq/L   BUN 52 (*) 6 - 23 mg/dL   Creatinine, Ser 2.80 (*) 0.50 - 1.35 mg/dL   Glucose, Bld 500 (*) 70 - 99 mg/dL   Calcium, Ion 1.25  1.13 - 1.30 mmol/L   TCO2 25  0 - 100 mmol/L   Hemoglobin 12.6 (*) 13.0 - 17.0 g/dL   HCT 37.0 (*) 39.0 - 52.0 %  GLUCOSE, CAPILLARY  Status: Abnormal   Collection Time    04/30/13 10:41 AM      Result Value Ref Range   Glucose-Capillary 438 (*) 70 - 99 mg/dL   Comment 1 Documented in Chart     Comment 2 Notify RN    GLUCOSE, CAPILLARY     Status: Abnormal   Collection Time    04/30/13 11:49 AM      Result Value Ref Range   Glucose-Capillary 375 (*) 70 - 99 mg/dL  GLUCOSE, CAPILLARY     Status: Abnormal   Collection Time    04/30/13  1:03 PM      Result Value Ref Range   Glucose-Capillary 183 (*) 70 - 99 mg/dL  GLUCOSE, CAPILLARY     Status: Abnormal   Collection Time    04/30/13  5:47 PM      Result Value Ref Range   Glucose-Capillary 140 (*) 70 - 99 mg/dL  CBC     Status: Abnormal   Collection Time    04/30/13  9:00 PM      Result Value Ref Range   WBC 6.9  4.0 - 10.5 K/uL   RBC 3.78 (*) 4.22 - 5.81 MIL/uL   Hemoglobin 10.3 (*) 13.0 - 17.0 g/dL   Comment: DELTA CHECK NOTED     REPEATED TO VERIFY   HCT 31.5 (*) 39.0 - 52.0 %   MCV 83.3  78.0 - 100.0 fL   MCH 27.2   26.0 - 34.0 pg   MCHC 32.7  30.0 - 36.0 g/dL   RDW 14.3  11.5 - 15.5 %   Platelets 308  150 - 400 K/uL  COMPREHENSIVE METABOLIC PANEL     Status: Abnormal   Collection Time    04/30/13  9:00 PM      Result Value Ref Range   Sodium 136 (*) 137 - 147 mEq/L   Potassium 4.1  3.7 - 5.3 mEq/L   Chloride 96  96 - 112 mEq/L   CO2 23  19 - 32 mEq/L   Glucose, Bld 272 (*) 70 - 99 mg/dL   BUN 53 (*) 6 - 23 mg/dL   Creatinine, Ser 2.25 (*) 0.50 - 1.35 mg/dL   Calcium 9.4  8.4 - 10.5 mg/dL   Total Protein 7.4  6.0 - 8.3 g/dL   Albumin 2.1 (*) 3.5 - 5.2 g/dL   AST 16  0 - 37 U/L   ALT 15  0 - 53 U/L   Alkaline Phosphatase 61  39 - 117 U/L   Total Bilirubin 0.4  0.3 - 1.2 mg/dL   GFR calc non Af Amer 25 (*) >90 mL/min   GFR calc Af Amer 30 (*) >90 mL/min   Comment: (NOTE)     The eGFR has been calculated using the CKD EPI equation.     This calculation has not been validated in all clinical situations.     eGFR's persistently <90 mL/min signify possible Chronic Kidney     Disease.  PROTIME-INR     Status: None   Collection Time    04/30/13  9:00 PM      Result Value Ref Range   Prothrombin Time 13.5  11.6 - 15.2 seconds   INR 1.05  0.00 - 1.49  HEMOGLOBIN A1C     Status: Abnormal   Collection Time    04/30/13  9:00 PM      Result Value Ref Range   Hemoglobin A1C 15.3 (*) <5.7 %   Comment: (  NOTE)                                                                               According to the ADA Clinical Practice Recommendations for 2011, when     HbA1c is used as a screening test:      >=6.5%   Diagnostic of Diabetes Mellitus               (if abnormal result is confirmed)     5.7-6.4%   Increased risk of developing Diabetes Mellitus     References:Diagnosis and Classification of Diabetes Mellitus,Diabetes     WNUU,7253,66(YQIHK 1):S62-S69 and Standards of Medical Care in             Diabetes - 2011,Diabetes VQQV,9563,87 (Suppl 1):S11-S61.   Mean Plasma Glucose 392 (*) <117 mg/dL    Comment: Performed at [facility], CAPILLARY     Status: Abnormal   Collection Time    04/30/13 11:08 PM      Result Value Ref Range   Glucose-Capillary 277 (*) 70 - 99 mg/dL   Comment 1 Documented in Chart     Comment 2 Notify RN    URINALYSIS, ROUTINE W REFLEX MICROSCOPIC     Status: Abnormal   Collection Time    05/01/13  3:17 AM      Result Value Ref Range   Color, Urine YELLOW  YELLOW   APPearance CLEAR  CLEAR   Specific Gravity, Urine 1.022  1.005 - 1.030   pH 5.5  5.0 - 8.0   Glucose, UA 250 (*) NEGATIVE mg/dL   Hgb urine dipstick NEGATIVE  NEGATIVE   Bilirubin Urine NEGATIVE  NEGATIVE   Ketones, ur NEGATIVE  NEGATIVE mg/dL   Protein, ur NEGATIVE  NEGATIVE mg/dL   Urobilinogen, UA 0.2  0.0 - 1.0 mg/dL   Nitrite NEGATIVE  NEGATIVE   Leukocytes, UA NEGATIVE  NEGATIVE   Comment: MICROSCOPIC NOT DONE ON URINES WITH NEGATIVE PROTEIN, BLOOD, LEUKOCYTES, NITRITE, OR GLUCOSE <1000 mg/dL.  BASIC METABOLIC PANEL     Status: Abnormal   Collection Time    05/01/13  3:49 AM      Result Value Ref Range   Sodium 136 (*) 137 - 147 mEq/L   Potassium 4.1  3.7 - 5.3 mEq/L   Chloride 99  96 - 112 mEq/L   CO2 22  19 - 32 mEq/L   Glucose, Bld 279 (*) 70 - 99 mg/dL   BUN 53 (*) 6 - 23 mg/dL   Creatinine, Ser 2.24 (*) 0.50 - 1.35 mg/dL   Calcium 9.2  8.4 - 10.5 mg/dL   GFR calc non Af Amer 26 (*) >90 mL/min   GFR calc Af Amer 30 (*) >90 mL/min   Comment: (NOTE)     The eGFR has been calculated using the CKD EPI equation.     This calculation has not been validated in all clinical situations.     eGFR's persistently <90 mL/min signify possible Chronic Kidney     Disease.  CBC     Status: Abnormal   Collection Time    05/01/13  3:49 AM      Result Value Ref  Range   WBC 6.5  4.0 - 10.5 K/uL   RBC 3.58 (*) 4.22 - 5.81 MIL/uL   Hemoglobin 9.6 (*) 13.0 - 17.0 g/dL   HCT 30.0 (*) 39.0 - 52.0 %   MCV 83.8  78.0 - 100.0 fL   MCH 26.8  26.0 - 34.0 pg   MCHC 32.0  30.0 -  36.0 g/dL   RDW 14.4  11.5 - 15.5 %   Platelets 302  150 - 400 K/uL  SURGICAL PCR SCREEN     Status: None   Collection Time    05/01/13  4:04 AM      Result Value Ref Range   MRSA, PCR NEGATIVE  NEGATIVE   Staphylococcus aureus NEGATIVE  NEGATIVE   Comment:            The Xpert SA Assay (FDA     approved for NASAL specimens     in patients over 66 years of age),     is one component of     a comprehensive surveillance     program.  Test performance has     been validated by [employer] for patients greater     than or equal to 44 year old.     It is not intended     to diagnose infection nor to     guide or monitor treatment.  GLUCOSE, CAPILLARY     Status: Abnormal   Collection Time    05/01/13  5:33 AM      Result Value Ref Range   Glucose-Capillary 255 (*) 70 - 99 mg/dL  GLUCOSE, CAPILLARY     Status: Abnormal   Collection Time    05/01/13  9:48 AM      Result Value Ref Range   Glucose-Capillary 308 (*) 70 - 99 mg/dL   Comment 1 Documented in Chart     Comment 2 Notify RN    GLUCOSE, CAPILLARY     Status: Abnormal   Collection Time    05/01/13  4:12 PM      Result Value Ref Range   Glucose-Capillary 184 (*) 70 - 99 mg/dL  CBC     Status: Abnormal   Collection Time    05/01/13  4:16 PM      Result Value Ref Range   WBC 8.5  4.0 - 10.5 K/uL   RBC 3.83 (*) 4.22 - 5.81 MIL/uL   Hemoglobin 10.5 (*) 13.0 - 17.0 g/dL   HCT 32.2 (*) 39.0 - 52.0 %   MCV 84.1  78.0 - 100.0 fL   MCH 27.4  26.0 - 34.0 pg   MCHC 32.6  30.0 - 36.0 g/dL   RDW 14.6  11.5 - 15.5 %   Platelets 309  150 - 400 K/uL  CREATININE, SERUM     Status: Abnormal   Collection Time    05/01/13  4:16 PM      Result Value Ref Range   Creatinine, Ser 2.13 (*) 0.50 - 1.35 mg/dL   GFR calc non Af Amer 27 (*) >90 mL/min   GFR calc Af Amer 32 (*) >90 mL/min   Comment: (NOTE)     The eGFR has been calculated using the CKD EPI equation.     This calculation has not been validated in all clinical  situations.     eGFR's persistently <90 mL/min signify possible Chronic Kidney     Disease.  GLUCOSE, CAPILLARY     Status: Abnormal  Collection Time    05/01/13  9:55 PM      Result Value Ref Range   Glucose-Capillary 227 (*) 70 - 99 mg/dL   Comment 1 Notify RN     Comment 2 Documented in Chart    CBC     Status: Abnormal   Collection Time    05/02/13 12:57 AM      Result Value Ref Range   WBC 7.9  4.0 - 10.5 K/uL   RBC 3.47 (*) 4.22 - 5.81 MIL/uL   Hemoglobin 9.3 (*) 13.0 - 17.0 g/dL   HCT 29.3 (*) 39.0 - 52.0 %   MCV 84.4  78.0 - 100.0 fL   MCH 26.8  26.0 - 34.0 pg   MCHC 31.7  30.0 - 36.0 g/dL   RDW 14.5  11.5 - 15.5 %   Platelets 285  150 - 400 K/uL  BASIC METABOLIC PANEL     Status: Abnormal   Collection Time    05/02/13 12:57 AM      Result Value Ref Range   Sodium 134 (*) 137 - 147 mEq/L   Potassium 3.8  3.7 - 5.3 mEq/L   Chloride 97  96 - 112 mEq/L   CO2 22  19 - 32 mEq/L   Glucose, Bld 228 (*) 70 - 99 mg/dL   BUN 43 (*) 6 - 23 mg/dL   Creatinine, Ser 2.07 (*) 0.50 - 1.35 mg/dL   Calcium 8.6  8.4 - 10.5 mg/dL   GFR calc non Af Amer 28 (*) >90 mL/min   GFR calc Af Amer 33 (*) >90 mL/min   Comment: (NOTE)     The eGFR has been calculated using the CKD EPI equation.     This calculation has not been validated in all clinical situations.     eGFR's persistently <90 mL/min signify possible Chronic Kidney     Disease.    Dg Chest Portable 1 View  04/30/2013   CLINICAL DATA:  Cardiac pacer.  EXAM: PORTABLE CHEST - 1 VIEW  COMPARISON:  DG CHEST 2V dated 03/13/2013  FINDINGS: Mediastinum and hilar structures are normal. Mild cardiomegaly. No pulmonary venous congestion. Cardiac pacer noted with lead tips in stable position. Stable elevation of both hemidiaphragms. No pleural effusion is identified. No focal pulmonary infiltrate identified. No pneumothorax. Low lung volumes.  IMPRESSION: 1. Prior CABG. Mild cardiomegaly. Cardiac pacer noted with lead tips in right atrium  right ventricle. No and CHF. 2. No acute cardiopulmonary disease. Low lung volumes with stable elevation of both hemidiaphragms.   Electronically Signed   By: Marcello Moores  Register   On: 04/30/2013 19:18   Dg Ang/ext/uni/or Left  05/01/2013   CLINICAL DATA:  Peripheral vascular disease, intraoperative angiogram following femoral popliteal bypass  EXAM: LEFT ANG/EXT/UNI/ OR  TECHNIQUE: Single View left lower extremity angiogram inferiorly  CONTRAST:  See operative report  FLUOROSCOPY TIME:  See operative report  COMPARISON:  None available  FINDINGS: Portable intraoperative angiogram performed of the left lower extremity including the above knee distal bypass anastomosis and the peripheral runoff. The distal above knee popliteal bypass anastomosis patent. Popliteal artery is patent. Atherosclerotic change noted of the popliteal artery without occlusion or stenosis. Single dominant runoff vessel noted.  IMPRESSION: Patent distal bypass anastomosis. Native popliteal artery noted with single vessel runoff.   Electronically Signed   By: Daryll Brod M.D.   On: 05/01/2013 13:22    Review of Systems  All other systems reviewed and are negative.  Blood  pressure 127/45, pulse 72, temperature 99.3 F (37.4 C), temperature source Oral, resp. rate 19, height _0  (1.778 m), weight 70.444 kg (155 lb 4.8 oz), SpO2 98.00%. Physical Exam On examination patient has dry gangrenous changes to the left great toe there is some ischemic changes over the medial border of the second toe which are new. Assessment/Plan: Assessment: Dry gangrene left great toe with ischemic changes to the second toe status post revascularization to the left lower extremity.  Plan: I will followup on Monday morning to reevaluate the patient's foot. Anticipate surgery Monday evening for either a transmetatarsal amputation or partial ray amputation for the left foot.  Newt Minion 05/02/2013, 7:16 AM

## 2013-05-03 LAB — BASIC METABOLIC PANEL
BUN: 42 mg/dL — ABNORMAL HIGH (ref 6–23)
CALCIUM: 8.7 mg/dL (ref 8.4–10.5)
CHLORIDE: 98 meq/L (ref 96–112)
CO2: 21 meq/L (ref 19–32)
Creatinine, Ser: 2.41 mg/dL — ABNORMAL HIGH (ref 0.50–1.35)
GFR calc Af Amer: 27 mL/min — ABNORMAL LOW (ref >90)
GFR calc non Af Amer: 23 mL/min — ABNORMAL LOW (ref >90)
Glucose, Bld: 195 mg/dL — ABNORMAL HIGH (ref 70–99)
Potassium: 4.2 mEq/L (ref 3.7–5.3)
Sodium: 133 mEq/L — ABNORMAL LOW (ref 137–147)

## 2013-05-03 LAB — GLUCOSE, CAPILLARY
GLUCOSE-CAPILLARY: 213 mg/dL — AB (ref 70–99)
GLUCOSE-CAPILLARY: 201 mg/dL — AB (ref 70–99)
Glucose-Capillary: 182 mg/dL — ABNORMAL HIGH (ref 70–99)
Glucose-Capillary: 150 mg/dL — ABNORMAL HIGH (ref 70–99)

## 2013-05-03 LAB — CBC
HEMATOCRIT: 27.1 % — AB (ref 39.0–52.0)
Hemoglobin: 8.6 g/dL — ABNORMAL LOW (ref 13.0–17.0)
MCH: 26.8 pg (ref 26.0–34.0)
MCHC: 31.7 g/dL (ref 30.0–36.0)
MCV: 84.4 fL (ref 78.0–100.0)
Platelets: 281 10*3/uL (ref 150–400)
RBC: 3.21 MIL/uL — ABNORMAL LOW (ref 4.22–5.81)
RDW: 14.8 % (ref 11.5–15.5)
WBC: 6.9 10*3/uL (ref 4.0–10.5)

## 2013-05-03 NOTE — Progress Notes (Addendum)
[facility]  Subjective  - Confused.  States he hasn't had surgery yet and what are we going to do to his foot.   Objective 126/54 92 99.2 F (37.3 C) (Oral) 16 100%  Intake/Output Summary (Last 24 hours) at 05/03/13 [id number] Last data filed at 05/03/13 0600  Gross per 24 hour  Intake    240 ml  Output    400 ml  Net   -160 ml    Left foot dressing in place Incisions clean and dry, groin soft Popliteal pulses palpable  Assessment/Planning: POD #2 left femoral-popliteal bypass graft with Gore-Tex for gangrene left first toe  Dr. Sharol Given plans possible OR Mon. for either a transmetatarsal amputation or partial ray amputation for the left foot.  Creatinine up 2.0  To 2.4 today CKD baseline noted in chart to be 2.4   Ulyses Amor 05/03/2013 9:03 AM --  Laboratory Lab Results:  Recent Labs  05/02/13 0057 05/03/13 0436  WBC 7.9 6.9  HGB 9.3* 8.6*  HCT 29.3* 27.1*  PLT 285 281   BMET  Recent Labs  05/02/13 0057 05/03/13 0436  NA 134* 133*  K 3.8 4.2  CL 97 98  CO2 22 21  GLUCOSE 228* 195*  BUN 43* 42*  CREATININE 2.07* 2.41*  CALCIUM 8.6 8.7    COAG Lab Results  Component Value Date   INR 1.05 04/30/2013   INR 0.94 02/27/2012   INR 1.06 01/09/2012   No results found for this basename: PTT   Agree with above assessment Femoral-popliteal graft is functioning well with 2+ popliteal pulse Dry gangrene left first toe-no proximal cellulitis  Plan probable transmetatarsal amputation medial aspect left foot on Monday per Dr. Sharol Given

## 2013-05-04 LAB — GLUCOSE, CAPILLARY
GLUCOSE-CAPILLARY: 154 mg/dL — AB (ref 70–99)
GLUCOSE-CAPILLARY: 257 mg/dL — AB (ref 70–99)
Glucose-Capillary: 183 mg/dL — ABNORMAL HIGH (ref 70–99)
Glucose-Capillary: 152 mg/dL — ABNORMAL HIGH (ref 70–99)

## 2013-05-04 NOTE — Progress Notes (Signed)
Patient ID: Colin [name], male   DOB: 02-Oct-1928, 80 y.o.   MRN: [id number] Vascular Surgery Progress Note  Subjective: 3 days post left femoral-popliteal bypass graft. Gangrene left first toe. No change in patient's symptomatology.  Objective:  Filed Vitals:   05/04/13 0354  BP: 120/49  Pulse: 67  Temp: 99.2 F (37.3 C)  Resp: 18    General alert and oriented x3 Lungs no rhonchi or wheezing Left leg with nicely healing incisions in 2-3+ popliteal graft pulse. Left first toe gangrene stable   Labs:  Recent Labs Lab 05/01/13 1616 05/02/13 0057 05/03/13 0436  CREATININE 2.13* 2.07* 2.41*    Recent Labs Lab 05/01/13 0349 05/01/13 1616 05/02/13 0057 05/03/13 0436  NA 136*  --  134* 133*  K 4.1  --  3.8 4.2  CL 99  --  97 98  CO2 22  --  22 21  BUN 53*  --  43* 42*  CREATININE 2.24* 2.13* 2.07* 2.41*  GLUCOSE 279*  --  228* 195*  CALCIUM 9.2  --  8.6 8.7    Recent Labs Lab 05/01/13 1616 05/02/13 0057 05/03/13 0436  WBC 8.5 7.9 6.9  HGB 10.5* 9.3* 8.6*  HCT 32.2* 29.3* 27.1*  PLT 309 285 281    Recent Labs Lab 04/30/13 2100  INR 1.05    I/O last 3 completed shifts: In: 720 [P.O.:720] Out: 1250 [Urine:1250]  Imaging: No results found.  Assessment/Plan:  POD #3  LOS: 4 days  s/p Procedure(s): LEFT FEMORAL TO ABOVE KNEE POPLITEAL ARTERY BYPASS GRAFT WITH INTRAOPERATIVE ARTEROGRAM  Patient doing well following left femoral-popliteal bypass graft Creatinine increased to 2.4 yesterday which is about his baseline Dr. Sharol Given 2 possibly perform transmetatarsal amputation tomorrow afternoon-we'll keep patient n.p.o.    [name], MD 05/04/2013 9:10 AM

## 2013-05-04 NOTE — Progress Notes (Signed)
Patient ID: Colin [name], male   DOB: July 09, 1928, 80 y.o.   MRN: [id number] Patient was stable dry gangrene of the left great toe with ischemic changes to the second toe. Will plan for first and second ray amputation tomorrow about 5 PM. Patient may be discharged to home postoperatively as soon as he is able to ambulate independently without weightbearing on the left foot.

## 2013-05-05 ENCOUNTER — Inpatient Hospital Stay: Admit: 2013-05-05 | Payer: Self-pay | Admitting: Orthopedic Surgery

## 2013-05-05 ENCOUNTER — Inpatient Hospital Stay (HOSPITAL_COMMUNITY): Payer: Medicare Other | Admitting: Anesthesiology

## 2013-05-05 ENCOUNTER — Encounter (HOSPITAL_COMMUNITY)
Admission: RE | Disposition: A | Discharge status: Skilled Nursing Facility | Payer: Self-pay | Source: Ambulatory Visit | Attending: Vascular Surgery

## 2013-05-05 ENCOUNTER — Encounter (HOSPITAL_COMMUNITY): Payer: Medicare Other | Admitting: Anesthesiology

## 2013-05-05 ENCOUNTER — Encounter (HOSPITAL_COMMUNITY): Payer: Self-pay | Admitting: Vascular Surgery

## 2013-05-05 HISTORY — PX: AMPUTATION: SHX166

## 2013-05-05 LAB — BASIC METABOLIC PANEL
BUN: 47 mg/dL — ABNORMAL HIGH (ref 6–23)
CALCIUM: 8.7 mg/dL (ref 8.4–10.5)
CO2: 22 mEq/L (ref 19–32)
CREATININE: 2.32 mg/dL — AB (ref 0.50–1.35)
Chloride: 101 mEq/L (ref 96–112)
GFR calc non Af Amer: 25 mL/min — ABNORMAL LOW (ref >90)
GFR, EST AFRICAN AMERICAN: 28 mL/min — AB (ref >90)
Glucose, Bld: 165 mg/dL — ABNORMAL HIGH (ref 70–99)
Potassium: 4.2 mEq/L (ref 3.7–5.3)
Sodium: 136 mEq/L — ABNORMAL LOW (ref 137–147)

## 2013-05-05 LAB — GLUCOSE, CAPILLARY
GLUCOSE-CAPILLARY: 178 mg/dL — AB (ref 70–99)
Glucose-Capillary: 173 mg/dL — ABNORMAL HIGH (ref 70–99)
Glucose-Capillary: 74 mg/dL (ref 70–99)
Glucose-Capillary: 85 mg/dL (ref 70–99)
Glucose-Capillary: 76 mg/dL (ref 70–99)
Glucose-Capillary: 91 mg/dL (ref 70–99)

## 2013-05-05 LAB — CBC
HCT: 27.4 % — ABNORMAL LOW (ref 39.0–52.0)
Hemoglobin: 8.7 g/dL — ABNORMAL LOW (ref 13.0–17.0)
MCH: 26.9 pg (ref 26.0–34.0)
MCHC: 31.8 g/dL (ref 30.0–36.0)
MCV: 84.6 fL (ref 78.0–100.0)
Platelets: 289 10*3/uL (ref 150–400)
RBC: 3.24 MIL/uL — ABNORMAL LOW (ref 4.22–5.81)
RDW: 14.8 % (ref 11.5–15.5)
WBC: 6 10*3/uL (ref 4.0–10.5)

## 2013-05-05 LAB — SURGICAL PCR SCREEN
MRSA, PCR: NEGATIVE
Staphylococcus aureus: NEGATIVE

## 2013-05-05 SURGERY — AMPUTATION, FOOT, RAY
Anesthesia: Regional | Site: Foot | Laterality: Left

## 2013-05-05 MED ORDER — HYDROMORPHONE HCL PF 1 MG/ML IJ SOLN: 0.2500 mg | INTRAMUSCULAR | Status: DC | PRN

## 2013-05-05 MED ORDER — SODIUM CHLORIDE 0.9 % IV SOLN
INTRAVENOUS | Status: DC
  Administered 2013-05-05: 17:00:00 via INTRAVENOUS
  Administered 2013-05-05: 10 mL/h via INTRAVENOUS

## 2013-05-05 MED ORDER — ONDANSETRON HCL 4 MG/2ML IJ SOLN: 4.0000 mg | Freq: Once | INTRAMUSCULAR | Status: DC | PRN

## 2013-05-05 MED ORDER — CHLORHEXIDINE GLUCONATE 4 % EX LIQD
60.0000 mL | Freq: Once | CUTANEOUS | Status: AC
  Administered 2013-05-05: 4 via TOPICAL
  Filled 2013-05-05: qty 60

## 2013-05-05 MED ORDER — CEFAZOLIN SODIUM-DEXTROSE 2-3 GM-% IV SOLR
INTRAVENOUS | Status: DC | PRN
  Administered 2013-05-05: 2 g via INTRAVENOUS

## 2013-05-05 MED ORDER — CEFAZOLIN SODIUM-DEXTROSE 2-3 GM-% IV SOLR
2.0000 g | INTRAVENOUS | Status: AC
  Administered 2013-05-05: 2 g via INTRAVENOUS
  Filled 2013-05-05: qty 50

## 2013-05-05 MED ORDER — 0.9 % SODIUM CHLORIDE (POUR BTL) OPTIME
TOPICAL | Status: DC | PRN
  Administered 2013-05-05: 1000 mL

## 2013-05-05 MED ORDER — CEFAZOLIN SODIUM 1-5 GM-% IV SOLN
1.0000 g | Freq: Four times a day (QID) | INTRAVENOUS | Status: AC
  Administered 2013-05-05 – 2013-05-06 (×3): 1 g via INTRAVENOUS
  Filled 2013-05-05 (×3): qty 50

## 2013-05-05 MED ORDER — OXYCODONE-ACETAMINOPHEN 5-325 MG PO TABS: 1.0000 | ORAL_TABLET | ORAL | Status: DC | PRN

## 2013-05-05 MED ORDER — HYDROMORPHONE HCL PF 1 MG/ML IJ SOLN
0.5000 mg | INTRAMUSCULAR | Status: DC | PRN
  Administered 2013-05-05 – 2013-05-07 (×5): 1 mg via INTRAVENOUS
  Filled 2013-05-05 (×5): qty 1

## 2013-05-05 MED ORDER — PROPOFOL 10 MG/ML IV BOLUS
INTRAVENOUS | Status: AC
  Filled 2013-05-05: qty 20

## 2013-05-05 MED ORDER — ONDANSETRON HCL 4 MG/2ML IJ SOLN: 4.0000 mg | Freq: Four times a day (QID) | INTRAMUSCULAR | Status: DC | PRN

## 2013-05-05 MED ORDER — ONDANSETRON HCL 4 MG PO TABS: 4.0000 mg | ORAL_TABLET | Freq: Four times a day (QID) | ORAL | Status: DC | PRN

## 2013-05-05 MED ORDER — METOCLOPRAMIDE HCL 5 MG/ML IJ SOLN
5.0000 mg | Freq: Three times a day (TID) | INTRAMUSCULAR | Status: DC | PRN
  Filled 2013-05-05: qty 2

## 2013-05-05 MED ORDER — METOCLOPRAMIDE HCL 5 MG PO TABS
5.0000 mg | ORAL_TABLET | Freq: Three times a day (TID) | ORAL | Status: DC | PRN
  Filled 2013-05-05: qty 2

## 2013-05-05 MED ORDER — SODIUM CHLORIDE 0.9 % IV SOLN
INTRAVENOUS | Status: DC
  Administered 2013-05-05: 10 mL/h via INTRAVENOUS

## 2013-05-05 SURGICAL SUPPLY — 30 items
BANDAGE GAUZE ELAST BULKY 4 IN (GAUZE/BANDAGES/DRESSINGS) ×3 IMPLANT
BLADE SAW SGTL MED 73X18.5 STR (BLADE) IMPLANT
BNDG COHESIVE 4X5 TAN STRL (GAUZE/BANDAGES/DRESSINGS) ×3 IMPLANT
COVER SURGICAL LIGHT HANDLE (MISCELLANEOUS) ×3 IMPLANT
DRAPE U-SHAPE 47X51 STRL (DRAPES) ×6 IMPLANT
DRSG ADAPTIC 3X8 NADH LF (GAUZE/BANDAGES/DRESSINGS) ×3 IMPLANT
DRSG PAD ABDOMINAL 8X10 ST (GAUZE/BANDAGES/DRESSINGS) ×6 IMPLANT
DURAPREP 26ML APPLICATOR (WOUND CARE) ×3 IMPLANT
ELECT REM PT RETURN 9FT ADLT (ELECTROSURGICAL) ×3
ELECTRODE REM PT RTRN 9FT ADLT (ELECTROSURGICAL) ×1 IMPLANT
GLOVE BIOGEL PI IND STRL 9 (GLOVE) ×1 IMPLANT
GLOVE BIOGEL PI INDICATOR 9 (GLOVE) ×2
GLOVE SURG ORTHO 9.0 STRL STRW (GLOVE) ×3 IMPLANT
GOWN STRL REUS W/ TWL XL LVL3 (GOWN DISPOSABLE) ×2 IMPLANT
GOWN STRL REUS W/TWL XL LVL3 (GOWN DISPOSABLE) ×6
KIT BASIN OR (CUSTOM PROCEDURE TRAY) ×3 IMPLANT
KIT ROOM TURNOVER OR (KITS) ×3 IMPLANT
NS IRRIG 1000ML POUR BTL (IV SOLUTION) ×3 IMPLANT
PACK ORTHO EXTREMITY (CUSTOM PROCEDURE TRAY) ×3 IMPLANT
PAD ABD 8X10 STRL (GAUZE/BANDAGES/DRESSINGS) ×2 IMPLANT
PAD ARMBOARD 7.5X6 YLW CONV (MISCELLANEOUS) ×6 IMPLANT
SPONGE GAUZE 4X4 12PLY (GAUZE/BANDAGES/DRESSINGS) ×3 IMPLANT
SPONGE GAUZE 4X4 12PLY STER LF (GAUZE/BANDAGES/DRESSINGS) ×2 IMPLANT
SPONGE LAP 18X18 X RAY DECT (DISPOSABLE) ×3 IMPLANT
STOCKINETTE IMPERVIOUS LG (DRAPES) IMPLANT
SUT ETHILON 2 0 PSLX (SUTURE) ×6 IMPLANT
TOWEL OR 17X24 6PK STRL BLUE (TOWEL DISPOSABLE) ×3 IMPLANT
TOWEL OR 17X26 10 PK STRL BLUE (TOWEL DISPOSABLE) ×3 IMPLANT
UNDERPAD 30X30 INCONTINENT (UNDERPADS AND DIAPERS) ×1 IMPLANT
WATER STERILE IRR 1000ML POUR (IV SOLUTION) ×1 IMPLANT

## 2013-05-05 NOTE — Progress Notes (Signed)
VASCULAR LAB PRELIMINARY  ARTERIAL  ABI completed:    RIGHT    LEFT    PRESSURE WAVEFORM  PRESSURE WAVEFORM  BRACHIAL 149 Triphasic BRACHIAL 153 Triphasic  DP 71 Severely dampened monophasic DP 105 Monophasic  AT   AT    PT  Unable to insonate. PT 117 Monophasic  PER   PER    GREAT TOE  NA GREAT TOE  NA    RIGHT LEFT  ABI 0.46 0.76    05/05/2013 12:04 PM Kristofor Michalowski, RVT, RDCS, RDMS

## 2013-05-05 NOTE — Anesthesia Postprocedure Evaluation (Signed)
  Anesthesia Post-op Note  Patient: Colin [name]  Procedure(s) Performed: Procedure(s): AMPUTATION RAY- FIRST AND SECOND-LEFT FOOT (Left)  Patient Location: PACU  Anesthesia Type:General  Level of Consciousness: awake  Airway and Oxygen Therapy: Patient Spontanous Breathing  Post-op Pain: mild  Post-op Assessment: Post-op Vital signs reviewed  Post-op Vital Signs: Reviewed  Last Vitals:  Filed Vitals:   05/05/13 1709  BP:   Pulse: 71  Temp: 36.8 C  Resp:     Complications: No apparent anesthesia complications

## 2013-05-05 NOTE — Anesthesia Procedure Notes (Signed)
Anesthesia Regional Block:  Ankle block  Pre-Anesthetic Checklist: ,, timeout performed, Correct Patient, Correct Site, Correct Laterality, Correct Procedure, Correct Position, site marked, Risks and benefits discussed,  Surgical consent,  Pre-op evaluation,  At surgeon's request and post-op pain management  Laterality: Left  Prep: Maximum Sterile Barrier Precautions used, chloraprep and alcohol swabs       Needles:  Injection technique: Single-shot      Needle Gauge: 25 and 25 G  Needle insertion depth: 3 cm   Additional Needles: Ankle block Narrative:  Start time: 05/05/2013 4:10 PM End time: 05/05/2013 4:15 PM Injection made incrementally with aspirations every 5 mL.  Performed by: Personally  Anesthesiologist: Sharolyn Douglas Md  Additional Notes: Pt accepts procedure w/ risks. 30cc ( 15cc 2% Lidocaine and 15cc 0.5% Marcaine ) Ant/ Post Tibial nerves. GES

## 2013-05-05 NOTE — Interval H&P Note (Signed)
History and Physical Interval Note:  05/05/2013 6:05 AM  Colin [name]  has presented today for surgery, with the diagnosis of /  The various methods of treatment have been discussed with the patient and family. After consideration of risks, benefits and other options for treatment, the patient has consented to  Procedure(s): AMPUTATION RAY- FIRST AND SECOND-LEFT FOOT (Left) as a surgical intervention .  The patient's history has been reviewed, patient examined, no change in status, stable for surgery.  I have reviewed the patient's chart and labs.  Questions were answered to the patient's satisfaction.     Newt Minion

## 2013-05-05 NOTE — Progress Notes (Signed)
Occupational Therapy Treatment Patient Details Name: Colin [name] MRN: [id number] DOB: 1928/03/03 Today's Date: 05/05/2013    History of present illness 1 day post left femoral-popliteal bypass graft with Gore-Tex for gangrene left first toe.   OT comments  This 80 yo male making progress since eval--at an overall min guard A level as long as he can put weight through heel of LLE. Will have to see post surgery today for LLE 1st and 2nd ray amputations what weight bearing limitations are to see what A level he will be at. Have left an MD to Dr. Sharol Given to clarify weight bearing status post surgery in orders ([place] completely, [place] through heel only, [place] through heel only in [place] shoe?).a  Follow Up Recommendations  Home health OT;Supervision/Assistance - 24 hour    Equipment Recommendations  None recommended by OT       Precautions / Restrictions Precautions Precautions: Fall Precaution Comments: R transmetarsal amp, L ganrene big toe Restrictions Weight Bearing Restrictions: No       Mobility Bed Mobility Overal bed mobility: Independent Bed Mobility: Supine to Sit;Sit to Supine     Supine to sit: Independent Sit to supine: Independent      Transfers Overall transfer level: Needs assistance Equipment used: Rolling walker (2 wheeled) Transfers: Sit to/from Stand           General transfer comment: VCs for safe hand placement    Balance Overall balance assessment: Needs assistance Sitting-balance support: No upper extremity supported;Feet supported Sitting balance-Leahy Scale: Poor   Postural control: Posterior lean;Right lateral lean Standing balance support: Bilateral upper extremity supported Standing balance-Leahy Scale: Fair                 High Level Balance Comments: Had pt attempt to hop in RLE only v. weight bearing through left heel (in case pt is completely NWB'ing LLE post surgery--pt mod A)   ADL Overall ADL's : Needs  assistance/impaired                     Lower Body Dressing: Moderate assistance;Sit to/from stand (due to loosing balance posteriorly and to left while seated EOB and trying to don right sock. Total A left sock due to bandages on foot.)   Toilet Transfer: Min guard (bed>door>back to bed)           Functional mobility during ADLs: Min guard;Rolling walker ([place] thru heal only LLE)                  Cognition   Behavior During Therapy: WFL for tasks assessed/performed Overall Cognitive Status: History of cognitive impairments - at baseline                                    Pertinent Vitals/ Pain       9/10 LLE: RN made aware and pt repositioned         Frequency Min 2X/week     Progress Toward Goals  OT Goals(current goals can now be found in the care plan section)  Progress towards OT goals: Progressing toward goals     Plan Discharge plan remains appropriate       End of Session Equipment Utilized During Treatment: Gait belt;Rolling walker   Activity Tolerance Patient tolerated treatment well   Patient Left in bed;with call bell/phone within reach   Nurse Communication Patient requests pain meds  Time: [id number] OT Time Calculation (min): 26 min  Charges: OT General Charges $OT Visit: 1 Procedure OT Treatments $Self Care/Home Management : 8-22 mins  Almon Register [id number] 05/05/2013, 10:02 AM

## 2013-05-05 NOTE — H&P (View-Only) (Signed)
Patient ID: Colin [name], male   DOB: 29-May-1928, 80 y.o.   MRN: [id number] Patient was stable dry gangrene of the left great toe with ischemic changes to the second toe. Will plan for first and second ray amputation tomorrow about 5 PM. Patient may be discharged to home postoperatively as soon as he is able to ambulate independently without weightbearing on the left foot.

## 2013-05-05 NOTE — Progress Notes (Signed)
Anticipate patient will need discharge to skilled nursing postoperatively. Patient's wife states she is unable to care for him at home.

## 2013-05-05 NOTE — Progress Notes (Signed)
Orthopedic Tech Progress Note Patient Details:  Colin [name] 07/04/1928 [id number]  Ortho Devices Type of Ortho Device: Postop shoe/boot Ortho Device/Splint Location: LLE Ortho Device/Splint Interventions: Ordered;Application   Braulio Bosch 05/05/2013, 5:37 PM

## 2013-05-05 NOTE — Progress Notes (Signed)
Patient has order to continue lovenox and also an order for no pharmacologic VTE prophylaxis due to an active bleed.  Dr. Ninfa Linden, MD on call for Dr. Sharol Given, notified.  Dr. Ninfa Linden states patient is not to receive lovenox today but may resume lovenox tomorrow.  Next scheduled dose of lovenox is ordered for tomorrow.  Will continue to monitor.

## 2013-05-05 NOTE — Transfer of Care (Signed)
Immediate Anesthesia Transfer of Care Note  Patient: Colin [name]  Procedure(s) Performed: Procedure(s): AMPUTATION RAY- FIRST AND SECOND-LEFT FOOT (Left)  Patient Location: PACU  Anesthesia Type:Regional  Level of Consciousness: awake, alert  and oriented  Airway & Oxygen Therapy: Patient Spontanous Breathing and Patient connected to nasal cannula oxygen  Post-op Assessment: Report given to PACU RN and Post -op Vital signs reviewed and stable  Post vital signs: Reviewed and stable  Complications: No apparent anesthesia complications

## 2013-05-05 NOTE — Op Note (Signed)
OPERATIVE REPORT  DATE OF SURGERY: 05/05/2013  PATIENT:  Colin [name],  80 y.o. male  PRE-OPERATIVE DIAGNOSIS:  gangrene left foot  POST-OPERATIVE DIAGNOSIS:  gangrene left foot  PROCEDURE:  Procedure(s): AMPUTATION RAY- FIRST AND SECOND-LEFT FOOT  SURGEON:  Surgeon(s): Newt Minion, MD  ANESTHESIA:   regional  EBL:  min ML  SPECIMEN:  No Specimen  TOURNIQUET:  * No tourniquets in log *  PROCEDURE DETAILS: Patient is an 80 year old gentleman with peripheral vascular disease with gangrene of the great toe and second toe. Patient presents at this time for first and second ray amputation. Risks and benefits were discussed including nonhealing of the wound need for higher level amputation. Patient states he understands and wished to proceed at this time. Description of procedure patient was brought to the operating room underwent a ankle block. After adequate levels and anesthesia obtained patient's left lower extremity was prepped using DuraPrep draped into a sterile field. A timeout was called. A racquet incision was made around these great toe and second toe. This was carried down to healthy viable tissue. There is no purulent abscess. The wound is irrigated with normal saline hemostasis was obtained. The incision was closed without tension the skin with 2-0 nylon. The wound was covered with Adaptic orthopedic sponges AB dressing Kerlix and Coban. Patient was taken to the PACU in stable condition.  PLAN OF CARE: Admit to inpatient   PATIENT DISPOSITION:  PACU - hemodynamically stable.   Newt Minion, MD 05/05/2013 5:18 PM

## 2013-05-05 NOTE — Anesthesia Preprocedure Evaluation (Signed)
Anesthesia Evaluation  Patient identified by MRN, date of birth, ID band Patient awake    Reviewed: Allergy & Precautions, H&P , NPO status , Patient's Chart, lab work & pertinent test results  Airway       Dental   Pulmonary shortness of breath, COPDformer smoker,          Cardiovascular hypertension, + angina + CAD, + Past MI, + CABG, + Peripheral Vascular Disease, +CHF and + DOE + pacemaker     Neuro/Psych    GI/Hepatic GERD-  ,  Endo/Other  diabetes, Type 2  Renal/GU Renal disease     Musculoskeletal   Abdominal   Peds  Hematology   Anesthesia Other Findings   Reproductive/Obstetrics                           Anesthesia Physical Anesthesia Plan  ASA: IV  Anesthesia Plan: Regional   Post-op Pain Management:    Induction: Intravenous  Airway Management Planned: Simple Face Mask  Additional Equipment:   Intra-op Plan:   Post-operative Plan:   Informed Consent: I have reviewed the patients History and Physical, chart, labs and discussed the procedure including the risks, benefits and alternatives for the proposed anesthesia with the patient or authorized representative who has indicated his/her understanding and acceptance.     Plan Discussed with:   Anesthesia Plan Comments:         Anesthesia Quick Evaluation

## 2013-05-05 NOTE — Progress Notes (Signed)
Received patient back from OR.  Patient alert and oriented; denies pain.  Vitals stable.  Will continue to monitor.

## 2013-05-05 NOTE — Progress Notes (Signed)
PT Cancellation Note  Patient Details Name: Colin [name] MRN: [id number] DOB: 10/08/1928   Cancelled Treatment:    Reason Eval/Treat Not Completed: Patient at procedure or test/unavailable.  Patient in OR today for ray amputation 1st and 2nd on Lt foot.  Will hold PT today and return tomorrow.   Despina Pole 05/05/2013, 9:53 AM Carita Pian. Sanjuana Kava, [place] Pager [phone]

## 2013-05-06 ENCOUNTER — Encounter (HOSPITAL_COMMUNITY): Payer: Self-pay | Admitting: Orthopedic Surgery

## 2013-05-06 LAB — GLUCOSE, CAPILLARY
GLUCOSE-CAPILLARY: 148 mg/dL — AB (ref 70–99)
Glucose-Capillary: 171 mg/dL — ABNORMAL HIGH (ref 70–99)
Glucose-Capillary: 195 mg/dL — ABNORMAL HIGH (ref 70–99)
Glucose-Capillary: 219 mg/dL — ABNORMAL HIGH (ref 70–99)

## 2013-05-06 NOTE — Progress Notes (Signed)
Clinical Social Work Department CLINICAL SOCIAL WORK PLACEMENT NOTE 05/06/2013  Patient:  Colin [name], Colin [name]  Account Number:  [id number] Admit date:  04/30/2013  Clinical Social Worker:  Megan Salon  Date/time:  05/06/2013 03:14 PM  Clinical Social Work is seeking post-discharge placement for this patient at the following level of care:   [place]   (*CSW will update this form in Epic as items are completed)   05/06/2013  Patient/family provided with [place] Department of Clinical Social Work's list of facilities offering this level of care within the geographic area requested by the patient (or if unable, by the patient's family).  05/06/2013  Patient/family informed of their freedom to choose among providers that offer the needed level of care, that participate in Medicare, Medicaid or managed care program needed by the patient, have an available bed and are willing to accept the patient.  05/06/2013  Patient/family informed of [facility]' ownership interest in [facility], as well as of the fact that they are under no obligation to receive care at this facility.  PASARR submitted to EDS on 05/06/2013 PASARR number received from EDS on 05/06/2013  FL2 transmitted to all facilities in geographic area requested by pt/family on  05/06/2013 FL2 transmitted to all facilities within larger geographic area on   Patient informed that his/her managed care company has contracts with or will negotiate with  certain facilities, including the following:     Patient/family informed of bed offers received:   Patient chooses bed at  Physician recommends and patient chooses bed at    Patient to be transferred to  on   Patient to be transferred to facility by   The following physician request were entered in Epic:   Additional Comments:  Jeanette Caprice, MSW, [place]

## 2013-05-06 NOTE — Care Management Note (Unsigned)
    Page 1 of 1   05/06/2013     3:12:24 PM   CARE MANAGEMENT NOTE 05/06/2013  Patient:  Colin [name], Colin [name]   Account Number:  [id number]  Date Initiated:  05/02/2013  Documentation initiated by:  Marvetta Gibbons  Subjective/Objective Assessment:   Pt admitted s/p fempop     Action/Plan:   PTA pt lived at home with spouse-- PT OT evals ordered - NCM to follow for recommendations   Anticipated DC Date:  05/07/2013   Anticipated DC Plan:  [place]  In-house referral  Clinical Social Worker      DC Planning Services  CM consult      Choice offered to / List presented to:             Status of service:  In process, will continue to follow Medicare Important Message given?   (If response is "NO", the following Medicare IM given date fields will be blank) Date Medicare IM given:   Date Additional Medicare IM given:    Discharge Disposition:    Per UR Regulation:  Reviewed for med. necessity/level of care/duration of stay  If discussed at [place] of Stay Meetings, dates discussed:   05/06/2013    Comments:  05/06/13 Kaarin Pardy,RN,BSN [id number] PT S/P LT TOE AMPUTATIONS ON 4/13; P.T. RECOMMENDING SNF AT DC; CSW CONSULTED TO FACILITATE DC TO SNFWHEN MEDICALLY STABLE FOR DC.  WILL FOLLOW PROGRESS.

## 2013-05-06 NOTE — Progress Notes (Signed)
Patient ID: Colin [name], male   DOB: December 12, 1928, 80 y.o.   MRN: [id number] Postoperative day 1 status post left foot first and second ray amputation. Dressing clean and dry. Anticipate patient will need discharge to skilled nursing.

## 2013-05-06 NOTE — Progress Notes (Signed)
Clinical Social Work Department BRIEF PSYCHOSOCIAL ASSESSMENT 05/06/2013  Patient:  CLAIBORNE, BINGAMAN     Account Number:  [id number]     Admit date:  04/30/2013  Clinical Social Worker:  Megan Salon  Date/Time:  05/06/2013 03:07 PM  Referred by:  Physician  Date Referred:  05/06/2013 Referred for  SNF Placement   Other Referral:   Interview type:  Other - See comment Other interview type:   CSW spoke to patient and patient's wife over the phone    PSYCHOSOCIAL DATA Living Status:  WIFE Admitted from facility:   Level of care:   Primary support name:  [facility] Primary support relationship to patient:  SPOUSE Degree of support available:   Good    CURRENT CONCERNS Current Concerns  Post-Acute Placement   Other Concerns:    SOCIAL WORK ASSESSMENT / PLAN CSW received referral for SNF placement at discharge. CSW went into room and introduced self and explained reason for visit..Patient appeared open to speaking with social worker, but appeared confused by what social worker was explaining. CSW asked patient if social worker can call patient's wife and patient stated yes. CSW then called patient's wife, Everlene Farrier, and explained reason for call. Patient's wife states she is agreeable for short term snf placement, stating, "I have arthritis and it would help me out for him to get some rehab before coming home." CSW explained the SNF process and patient's wife verbalized her understanding and agreed to the process. CSW will fax patient's information out and provide bed offers tomorrow.   Assessment/plan status:  Psychosocial Support/Ongoing Assessment of Needs Other assessment/ plan:   Information/referral to community resources:   SNF information    PATIENT'S/FAMILY'S RESPONSE TO PLAN OF CARE: Patient's wife agreeable to SNF placement and feels that this is a good idea for patient to get stronger.        Jeanette Caprice, MSW, [place]

## 2013-05-06 NOTE — Progress Notes (Signed)
[facility] [place]  Subjective  - Pleasant no new complaints.   Objective 163/66 76 100.2 F (37.9 C) (Oral) 18 100%  Intake/Output Summary (Last 24 hours) at 05/06/13 0814 Last data filed at 05/05/13 1700  Gross per 24 hour  Intake     75 ml  Output   1100 ml  Net  -1025 ml    Left foot dressing clean and dry. Incisions at groin and popliteal area are well healed. Doppler AT/peroneal left and DP/PT right signals   Assessment/Planning: POD #4  LEFT FEMORAL TO ABOVE KNEE POPLITEAL ARTERY BYPASS GRAFT WITH INTRAOPERATIVE ARTEROGRAM  POD#1  AMPUTATION RAY- FIRST AND SECOND-LEFT FOOT  Social work consult for SNF  Colin [name] 05/06/2013 8:14 AM --  Laboratory Lab Results:  Recent Labs  05/05/13 0640  WBC 6.0  HGB 8.7*  HCT 27.4*  PLT 289   BMET  Recent Labs  05/05/13 0640  NA 136*  K 4.2  CL 101  CO2 22  GLUCOSE 165*  BUN 47*  CREATININE 2.32*  CALCIUM 8.7    COAG Lab Results  Component Value Date   INR 1.05 04/30/2013   INR 0.94 02/27/2012   INR 1.06 01/09/2012   No results found for this basename: PTT

## 2013-05-06 NOTE — Progress Notes (Addendum)
Physical Therapy Treatment Patient Details Name: Colin [name] MRN: [id number] DOB: 09/01/1928 Today's Date: 05/06/2013    History of Present Illness post left femoral-popliteal bypass graft; 4/13 underwent Lt 1-2 Ray amputations; PMHx includes Rt transmetatarsal amputation (has prosthetic shoe), DM, CHF and cardiomyopathy EF 35%, dementia    PT Comments    Pt now is NWB LLE and unable to maintain NWB during transfer. If pt were to d/c home, would need to be at a wheelchair level and doubtful this is possible for him and his wife to manage. Agree with SNF for continued therapy and await clarification if pt may weight bear through heel.   Follow Up Recommendations  SNF;Supervision/Assistance - 24 hour     Equipment Recommendations  None recommended by PT    Recommendations for Other Services       Precautions / Restrictions Precautions Precautions: Fall Restrictions Weight Bearing Restrictions: Yes LLE Weight Bearing: Non weight bearing Other Position/Activity Restrictions: has post-op shoe for Lt foot--? if MD will OK weightbearing thru heel    Mobility  Bed Mobility Overal bed mobility: Needs Assistance Bed Mobility: Supine to Sit     Supine to sit: Min assist     General bed mobility comments: pt using rail; difficulty scooting out to edge of bed and required assist to advance pelvis  Transfers Overall transfer level: Needs assistance Equipment used: Rolling walker (2 wheeled) Transfers: Sit to/from Stand Sit to Stand: Mod assist         General transfer comment: vc for safe use of RW; physical assist to hold LLE in NWB; assist for balance  Ambulation/Gait Ambulation/Gait assistance: Mod assist Ambulation Distance (Feet): 1 Feet Assistive device: Rolling walker (2 wheeled) Gait Pattern/deviations: Step-to pattern;Trunk flexed     General Gait Details: pivotal steps to chair; pt initially holding LLE in NWB, however as he began to pivot, he place Lt  heel down; vc for sequence and to incr use of UE support on RW   Stairs            Wheelchair Mobility    Modified Rankin (Stroke Patients Only)       Balance Overall balance assessment: Needs assistance Sitting-balance support: No upper extremity supported;Feet supported Sitting balance-Leahy Scale: Fair     Standing balance support: Bilateral upper extremity supported Standing balance-Leahy Scale: Poor                      Cognition Arousal/Alertness: Awake/alert Behavior During Therapy: WFL for tasks assessed/performed Overall Cognitive Status: History of cognitive impairments - at baseline Area of Impairment: Safety/judgement;Problem solving         Safety/Judgement: Decreased awareness of safety;Decreased awareness of deficits   Problem Solving: Slow processing;Decreased initiation;Difficulty sequencing;Requires verbal cues;Requires tactile cues      Exercises      General Comments General comments (skin integrity, edema, etc.): Pt moving very slowly due to Lt foot pain.       Pertinent Vitals/Pain Pt pre-medicated for pain prior to session. Pt calm and relaxed with no signs of distress at beginning of session and reported his pain as 10/10 across medial aspect of foot/ankle. Explained pain scale again to patient and he said "i don't know."    Home Living                      Prior Function            PT Goals (current goals can now  be found in the care plan section) Acute Rehab PT Goals PT Goal Formulation: With patient Time For Goal Achievement: 05/20/13 Potential to Achieve Goals: Fair Progress towards PT goals: Goals downgraded-see care plan (now NWB LLE)    Frequency  Min 3X/week    PT Plan Discharge plan needs to be updated    Co-evaluation             End of Session Equipment Utilized During Treatment: Gait belt Activity Tolerance: Patient limited by pain Patient left: in chair;with nursing/sitter in room      Time: [id number] PT Time Calculation (min): 27 min  Charges:  $Therapeutic Activity: 8-22 mins                    G CodesJeanie Cooks [name] 2013-06-04, 11:58 AM Pager [phone]

## 2013-05-07 LAB — GLUCOSE, CAPILLARY
GLUCOSE-CAPILLARY: 178 mg/dL — AB (ref 70–99)
Glucose-Capillary: 163 mg/dL — ABNORMAL HIGH (ref 70–99)
Glucose-Capillary: 239 mg/dL — ABNORMAL HIGH (ref 70–99)
Glucose-Capillary: 194 mg/dL — ABNORMAL HIGH (ref 70–99)

## 2013-05-07 NOTE — Progress Notes (Signed)
CSW provided bed offers to patient's wife over the phone. Patient's wife would like Education officer, museum to follow up with [facility]. CSW left voicemail for [facility] Admissions.  Jeanette Caprice, MSW, [place]

## 2013-05-07 NOTE — Progress Notes (Signed)
Inpatient Diabetes Program Recommendations  AACE/ADA: New Consensus Statement on Inpatient Glycemic Control  Target Ranges:  Prepandial:   less than 140 mg/dL      Peak postprandial:   less than 180 mg/dL (1-2 hours)      Critically ill patients:  140 - 180 mg/dL  Pager:  [phone] Hours:  8 am-10pm   Reason for Visit: Elevated prandial glucose:  Results for Colin [name], Colin [name] (MRN [id number]) as of 05/07/2013 12:07  Ref. Range 05/07/2013 05:59 05/07/2013 11:30  Glucose-Capillary Latest Range: 70-99 mg/dL 163 (H) 239 (H)    Inpatient Diabetes Program Recommendations Insulin - Meal Coverage: Add Novolog 3 units TID for meal coverage  Courtney Heys PhD, RN, BC-ADM Diabetes Coordinator  Office:  [phone] Team Pager:  [phone]

## 2013-05-07 NOTE — Progress Notes (Addendum)
[facility]   Subjective  - Alert and pleasantly confused.   Objective 153/48 65 99.8 F (37.7 C) (Oral) 19 98%  Intake/Output Summary (Last 24 hours) at 05/07/13 0805 Last data filed at 05/07/13 0400  Gross per 24 hour  Intake    720 ml  Output    901 ml  Net   -181 ml    Left foot dressing clean and dry Legs warm to touch and well perfused Incision are soft without drainage or hematoma  Assessment/Planning: POD #5 LEFT FEMORAL TO ABOVE KNEE POPLITEAL ARTERY BYPASS GRAFT WITH INTRAOPERATIVE ARTEROGRAM  POD#2 AMPUTATION RAY- [place] Social work consult for SNF    Ulyses Amor 05/07/2013 8:05 AM --  Laboratory Lab Results:  Recent Labs  05/05/13 0640  WBC 6.0  HGB 8.7*  HCT 27.4*  PLT 289   BMET  Recent Labs  05/05/13 0640  NA 136*  K 4.2  CL 101  CO2 22  GLUCOSE 165*  BUN 47*  CREATININE 2.32*  CALCIUM 8.7    COAG Lab Results  Component Value Date   INR 1.05 04/30/2013   INR 0.94 02/27/2012   INR 1.06 01/09/2012   No results found for this basename: PTT      A groove above Bypass patent Await skilled nursing facility placement

## 2013-05-07 NOTE — Progress Notes (Signed)
Occupational Therapy Treatment Patient Details Name: Colin [name] MRN: [id number] DOB: 1928-06-25 Today's Date: 05/07/2013    History of present illness post left femoral-popliteal bypass graft; 4/13 underwent Lt 1-2 Ray amputations; PMHx includes Rt transmetatarsal amputation (has prosthetic shoe), DM, CHF and cardiomyopathy EF 35%, dementia   OT comments  Minimal ability for pt to participate due to sleepiness likely from pain meds.  Session limited to bed mobility and grooming.  Awaiting MD order for WB status through L heel vs NWB.  Plan now is for ST rehab in SNF per chart.  Follow Up Recommendations  SNF;Supervision/Assistance - 24 hour    Equipment Recommendations  None recommended by OT    Recommendations for Other Services      Precautions / Restrictions Precautions Precautions: Fall Restrictions Weight Bearing Restrictions: Yes LLE Weight Bearing: Non weight bearing Other Position/Activity Restrictions: has post-op shoe for Lt foot--? if MD will OK weightbearing thru heel       Mobility Bed Mobility Overal bed mobility: Needs Assistance Bed Mobility: Supine to Sit;Sit to Supine     Supine to sit: Mod assist Sit to supine: Min assist   General bed mobility comments: +2 to scoot to EOB  Transfers                      Balance     Sitting balance-Leahy Scale: Fair                             ADL Overall ADL's : Needs assistance/impaired     Grooming: Wash/dry hands;Wash/dry face;Set up;Sitting                                 General ADL Comments: Pt with lethargy, likely due to pain meds. Session limited to bed mobility and grooming.      Vision                     Perception     Praxis      Cognition   Behavior During Therapy: Flat affect Overall Cognitive Status: History of cognitive impairments - at baseline Area of Impairment: Safety/judgement;Problem solving;Memory Orientation Level:  Disoriented to;Time   Memory: Decreased short-term memory    Safety/Judgement: Decreased awareness of safety;Decreased awareness of deficits   Problem Solving: Slow processing;Decreased initiation;Difficulty sequencing;Requires verbal cues;Requires tactile cues General Comments: H/o dementia.  Incr time for processing how to scoot to edge of chair.  No family present to determine pt's baseline.    Extremity/Trunk Assessment               Exercises     Shoulder Instructions       General Comments      Pertinent Vitals/ Pain       No pain reported, VSS.  Home Living                                          Prior Functioning/Environment              Frequency Min 2X/week     Progress Toward Goals  OT Goals(current goals can now be found in the care plan section)  Progress towards OT goals: Not progressing toward goals - comment (lethargy)  Plan Discharge plan needs to be updated    Co-evaluation                 End of Session     Activity Tolerance Patient limited by fatigue;Patient limited by lethargy   Patient Left in bed;with call bell/phone within reach;with nursing/sitter in room   Nurse Communication          Time: 1005-1020 OT Time Calculation (min): 15 min  Charges: OT General Charges $OT Visit: 1 Procedure OT Treatments $Self Care/Home Management : 8-22 mins  Colin [name] 05/07/2013, 10:23 AM [phone]

## 2013-05-08 LAB — CREATININE, SERUM
CREATININE: 2.4 mg/dL — AB (ref 0.50–1.35)
GFR calc Af Amer: 27 mL/min — ABNORMAL LOW (ref >90)
GFR, EST NON AFRICAN AMERICAN: 24 mL/min — AB (ref >90)

## 2013-05-08 LAB — GLUCOSE, CAPILLARY
Glucose-Capillary: 196 mg/dL — ABNORMAL HIGH (ref 70–99)
Glucose-Capillary: 214 mg/dL — ABNORMAL HIGH (ref 70–99)

## 2013-05-08 MED ORDER — OXYCODONE HCL 5 MG PO TABS: 5.0000 mg | ORAL_TABLET | Freq: Four times a day (QID) | ORAL | Status: DC | PRN

## 2013-05-08 NOTE — Progress Notes (Signed)
Clinical Social Work Department CLINICAL SOCIAL WORK PLACEMENT NOTE 05/08/2013  Patient:  Colin [name], Colin [name]  Account Number:  [id number] Admit date:  04/30/2013  Clinical Social Worker:  Megan Salon  Date/time:  05/06/2013 03:14 PM  Clinical Social Work is seeking post-discharge placement for this patient at the following level of care:   [place]   (*CSW will update this form in Epic as items are completed)   05/06/2013  Patient/family provided with [place] Department of Clinical Social Work's list of facilities offering this level of care within the geographic area requested by the patient (or if unable, by the patient's family).  05/06/2013  Patient/family informed of their freedom to choose among providers that offer the needed level of care, that participate in Medicare, Medicaid or managed care program needed by the patient, have an available bed and are willing to accept the patient.  05/06/2013  Patient/family informed of [facility]' ownership interest in [facility], as well as of the fact that they are under no obligation to receive care at this facility.  PASARR submitted to EDS on 05/06/2013 PASARR number received from EDS on 05/06/2013  FL2 transmitted to all facilities in geographic area requested by pt/family on  05/06/2013 FL2 transmitted to all facilities within larger geographic area on   Patient informed that his/her managed care company has contracts with or will negotiate with  certain facilities, including the following:     Patient/family informed of bed offers received:  05/07/2013 Patient chooses bed at [facility] Physician recommends and patient chooses bed at    Patient to be transferred to [facility] on  05/08/2013 Patient to be transferred to facility by EMS  The following physician request were entered in Epic:   Additional Comments:  Jeanette Caprice, MSW,  [place]

## 2013-05-08 NOTE — Progress Notes (Signed)
Assessment unchanged. Packet given to EMS with prescriptions. IV and tele removed. Pt left via EMS with belongings.

## 2013-05-08 NOTE — Progress Notes (Signed)
Patient ID: Colin [name], male   DOB: May 23, 1928, 80 y.o.   MRN: [id number] Vascular Surgery Progress Note  Subjective: 7 days post left femoral popliteal bypass graft. Refaced post partial foot amputation by Dr. Sharol Given  Objective:  Filed Vitals:   05/08/13 0316  BP: 113/29  Pulse: 67  Temp: 99.1 F (37.3 C)  Resp: 18    Left leg incisions continued to heal nicely with 3+ popliteal graft pulse palpable. Dressing a left foot amputation site dry with boot in place   Labs:  Recent Labs Lab 05/03/13 0436 05/05/13 0640 05/08/13 0215  CREATININE 2.41* 2.32* 2.40*    Recent Labs Lab 05/02/13 0057 05/03/13 0436 05/05/13 0640 05/08/13 0215  NA 134* 133* 136*  --   K 3.8 4.2 4.2  --   CL 97 98 101  --   CO2 22 21 22   --   BUN 43* 42* 47*  --   CREATININE 2.07* 2.41* 2.32* 2.40*  GLUCOSE 228* 195* 165*  --   CALCIUM 8.6 8.7 8.7  --     Recent Labs Lab 05/02/13 0057 05/03/13 0436 05/05/13 0640  WBC 7.9 6.9 6.0  HGB 9.3* 8.6* 8.7*  HCT 29.3* 27.1* 27.4*  PLT 285 281 289   No results found for this basename: INR,  in the last 168 hours  I/O last 3 completed shifts: In: 840 [P.O.:840] Out: 1200 [Urine:1200]  Imaging: No results found.  Assessment/Plan:  POD #7   LOS: 8 days  s/p Procedure(s): AMPUTATION RAY- FIRST AND SECOND-LEFT FOOT  The patient ready to be transferred to skilled nursing facility once bed has been identified I will see patient back in office in 2 weeks   [name], MD 05/08/2013 8:27 AM

## 2013-05-08 NOTE — Discharge Summary (Signed)
[facility] Discharge Summary  Colin [name] [birth year] 80 y.o. male  [id number]  Admission Date: 04/30/2013  Discharge Date: 05/08/13  Physician: Mal Misty, MD  Admission Diagnosis: Gangrene Green- Left Great Toe Critical limb ischemia /   HPI:   This is a 80 y.o. male patient of Dr. Donnetta Hutching is with a history of a failed right femoropopliteal bypass graft and right transmetatarsal amputation in 2006.  At his visit in 2013 the patient declined further intervention.  He returns today per request of Dr. Sharol Given for evaluation of left gangrenous great toe.  He is in a wheelchair now but states he walks some with a cane.  He has been taking an antibiotic prescribed by Dr. Sharol Given since this week end, and states the left foot has improved a little in the last day or two.  The left great toe started draining in the last day or two.  Patient cannot recall when the left great toe turned dark, sister and grandson with him do not know.  He has an appointment with Dr. Sharol Given tomorrow.  Patient states he wants his left toe amputated.   Hospital Course:  The patient was admitted to the hospital and taken to the [facility] lab on 04/30/2013 and underwent: Abdominal aortogram and bilateral iliac angiography via right common femoral percutaneous approach using CO2  Section order catheterization left external iliac artery with left lower extremity angiogram using CO2 and 30 cc of Visapaque    The pt tolerated the procedure well and was transported to the PACU in good condition. The pt was also evaluated by Dr. Sharol Given for toe amputation.  On 05/01/13, the pt was taken to the operating room and underwent a left femoral to above knee popliteal artery bypass graft using 76mm gortex propaten with intraoperative arteriogram.  He tolerated well and was transported to the PACU in stable condition.    He was transferred to the telemetry floor on POD 1.  Pt did have some confusion on POD 2.  On POD 3,  he did have a rise in his creatinine to 2.4, which is about baseline.  Post operative ABI's on 05/05/13 are as follows:  RIGHT    LEFT     PRESSURE  WAVEFORM   PRESSURE  WAVEFORM   BRACHIAL  149  Triphasic  BRACHIAL  153  Triphasic   DP  71  Severely dampened monophasic  DP  105  Monophasic   AT    AT     PT   Unable to insonate.  PT  117  Monophasic   PER    PER     GREAT TOE   NA  GREAT TOE   NA     RIGHT  LEFT   ABI  0.46  0.76    On, 05/05/13, he was taken back to the operating room and underwent 1st and 2nd left foot ray amputation by Dr. Sharol Given.  He tolerated well.   On POD 7 from fem pop bypass, he is ready to be discharged to SNF.  The remainder of the hospital course consisted of increasing mobilization and increasing intake of solids without difficulty.  CBC    Component Value Date/Time   WBC 6.0 05/05/2013 0640   RBC 3.24* 05/05/2013 0640   HGB 8.7* 05/05/2013 0640   HCT 27.4* 05/05/2013 0640   PLT 289 05/05/2013 0640   MCV 84.6 05/05/2013 0640   MCH 26.9 05/05/2013 0640   MCHC 31.8 05/05/2013 0640  RDW 14.8 05/05/2013 0640   LYMPHSABS 1.0 07/01/2012 1430   MONOABS 0.4 07/01/2012 1430   EOSABS 0.1 07/01/2012 1430   BASOSABS 0.0 07/01/2012 1430    BMET    Component Value Date/Time   NA 136* 05/05/2013 0640   K 4.2 05/05/2013 0640   CL 101 05/05/2013 0640   CO2 22 05/05/2013 0640   GLUCOSE 165* 05/05/2013 0640   BUN 47* 05/05/2013 0640   CREATININE 2.40* 05/08/2013 0215   CALCIUM 8.7 05/05/2013 0640   GFRNONAA 24* 05/08/2013 0215   GFRAA 27* 05/08/2013 0215     Discharge Instructions:   The patient is discharged to home with extensive instructions on wound care and progressive ambulation.  They are instructed not to drive or perform any heavy lifting until returning to see the physician in his office.      Discharge Orders   Future Appointments Provider Department Dept Phone   05/15/2013 9:20 AM Sharmon Leyden Nickel, NP [facility] [phone]    12/30/2013 10:00 AM [facility] [place] [phone]   12/30/2013 11:40 AM Sharmon Leyden Nickel, NP [facility] [phone]   Future Orders Complete By Expires   Call MD for:  redness, tenderness, or signs of infection (pain, swelling, bleeding, redness, odor or green/yellow discharge around incision site)  As directed    Call MD for:  severe or increased pain, loss or decreased feeling  in affected limb(s)  As directed    Call MD for:  temperature >100.5  As directed    may wash over wound with mild soap and water  As directed    Resume previous diet  As directed       Discharge Diagnosis:  Gangrene Green- Left Great Toe Critical limb ischemia /  Secondary Diagnosis: Patient Active Problem List   Diagnosis Date Noted  . PAD (peripheral artery disease) 04/30/2013  . Atherosclerosis of native arteries of the extremities with gangrene 04/29/2013  . Other stiff joint, of the lower leg 12/31/2012  . Fall 07/01/2012  . Hypoglycemia 07/01/2012  . Hypothermia 07/01/2012  . Type 2 diabetes mellitus with hyperosmolar nonketotic hyperglycemia 02/27/2012  . Failure to thrive in adult 02/27/2012  . Non-compliant patient 02/27/2012  . Microscopic hematuria 01/11/2012  . Syncope 01/09/2012  . CKD (chronic kidney disease), stage III 01/09/2012  . Diabetes mellitus with peripheral artery disease   . Pain in limb 12/29/2011  . Peripheral vascular disease, unspecified 12/29/2011  . DOE (dyspnea on exertion) 04/10/2011  . Chronic systolic CHF (congestive heart failure)   . Peripheral vascular disease   . Dyslipidemia   . Acute kidney injury   . History of CVA (cerebrovascular accident)   . Cardiomyopathy   . COPD (chronic obstructive pulmonary disease)   . History of anemia   . CORONARY ARTERY DISEASE 11/30/2009  . Cardiac pacemaker in situ 11/30/2009   Past Medical History  Diagnosis Date  . Coronary artery disease     Status  post CABG in 2004  . Peripheral vascular disease     Status post right femoropopliteal bypass  . Dyslipidemia   . Diabetes mellitus with peripheral artery disease   . Hypertension   . Chronic renal insufficiency     creatinine around 2.4  . CHF (congestive heart failure) 2007    ef 38%  . History of CVA (cerebrovascular accident)   . Cardiomyopathy   . COPD (chronic obstructive pulmonary disease)   . History  of anemia   . Myocardial infarction   . Shortness of breath   . Pacemaker   . Pneumonia   . Anginal pain   . Dementia   . GERD (gastroesophageal reflux disease)   . Arthritis        Medication List    STOP taking these medications       cephALEXin 250 MG capsule  Commonly known as:  KEFLEX      TAKE these medications       aspirin 81 MG EC tablet  Take 4 tablets (325 mg total) by mouth daily.     B-complex with vitamin C tablet  Take 1 tablet by mouth daily.     carvedilol 6.25 MG tablet  Commonly known as:  COREG  Take 1 tablet (6.25 mg total) by mouth 2 (two) times daily with a meal.     ezetimibe 10 MG tablet  Commonly known as:  ZETIA  Take 10 mg by mouth daily.     Fish Oil 1000 MG Caps  Take 1,000 mg by mouth daily.     furosemide 40 MG tablet  Commonly known as:  LASIX  Take 40 mg by mouth daily.     glucose 4 GM chewable tablet  Chew 4 tablets (16 g total) by mouth as needed for low blood sugar.     hydrALAZINE 10 MG tablet  Commonly known as:  APRESOLINE  Take 1 tablet (10 mg total) by mouth 3 (three) times daily. 8 HOURS APART LIKE 6 AM 2 PM 10 PM     isosorbide mononitrate 30 MG 24 hr tablet  Commonly known as:  IMDUR  Take 1 tablet (30 mg total) by mouth daily.     linagliptin 5 MG Tabs tablet  Commonly known as:  TRADJENTA  Take 5 mg by mouth daily.     omeprazole 40 MG capsule  Commonly known as:  PRILOSEC  Take 40 mg by mouth daily.     oxyCODONE 5 MG immediate release tablet  Commonly known as:  Oxy IR/ROXICODONE  Take  1 tablet (5 mg total) by mouth every 6 (six) hours as needed for moderate pain.     sodium polystyrene 15 GM/60ML suspension  Commonly known as:  KAYEXALATE  Take 15 g by mouth 3 (three) times a week.        Roxicodone #30 No Refill  Disposition: SNF  Patient's condition: is Good  Follow up: 1. Dr. Kellie Simmering in 2 weeks 2. Dr. Sharol Given in one week  Instructions:  Pt is non weight bearing until further instructions from Dr. Sherre Lain, PA-C [facility] [phone] 05/08/2013  9:17 AM  - For VQI Registry use --- Instructions: Press F2 to tab through selections.  Delete question if not applicable.   Post-op:  Wound infection: No  Graft infection: No  Transfusion: No  If yes, n/a units given New Arrhythmia: No Ipsilateral amputation: No, [ ]  Minor, [ ]  BKA, [ ]  AKA Discharge patency: [ ]  Primary, [ ]  Primary assisted, [ ]  Secondary, [ ]  Occluded Patency judged by: [ ]  Dopper only, [ ]  Palpable graft pulse, [ ]  Palpable distal pulse, [ ]  ABI inc. > 0.15, [ ]  Duplex Discharge ABI: R 0.46, L 0.76 Discharge TBI: R , L  D/C Ambulatory Status: Ambulatory with Assistance  Complications: MI: No, [ ]  Troponin only, [ ]  EKG or Clinical CHF: No Resp failure:No, [ ]  Pneumonia, [ ]  Ventilator Chg in renal  function: Yes, [ ]  Inc. Cr > 0.5, [ ]  Temp. Dialysis, [ ]  Permanent dialysis Stroke: No, [ ]  Minor, [ ]  Major Return to OR: No  Reason for return to OR: [ ]  Bleeding, [ ]  Infection, [ ]  Thrombosis, [ ]  Revision  Discharge medications: Statin use:  no ASA use:  yes Plavix use:  no Beta blocker use: yes Coumadin use: no

## 2013-05-08 NOTE — Progress Notes (Signed)
Clinical Social Worker facilitated patient discharge by contacting the patient, family and facility, [facility]. Patient's wife agreeable to this plan and arranging transport via EMS . CSW will sign off, as social work intervention is no longer needed.  Jeanette Caprice, MSW, [place]

## 2013-05-08 NOTE — Progress Notes (Signed)
Physical Therapy Treatment Patient Details Name: Colin [name] MRN: [id number] DOB: 08-02-1928 Today's Date: 05/08/2013    History of Present Illness post left femoral-popliteal bypass graft; 4/13 underwent Lt 1-2 Ray amputations; PMHx includes Rt transmetatarsal amputation (has prosthetic shoe), DM, CHF and cardiomyopathy EF 35%, dementia    PT Comments    Pt admitted with above. Pt currently with functional limitations due to balance and endurance deficits as well as difficulty holding LLE off floor and maintaining weight bearing.  Pt also with pain with LLE in dependent position.  Slow progress thus far.   Pt will benefit from skilled PT to increase their independence and safety with mobility to allow discharge to the venue listed below.   Follow Up Recommendations  SNF;Supervision/Assistance - 24 hour     Equipment Recommendations  None recommended by PT    Recommendations for Other Services       Precautions / Restrictions Precautions Precautions: Fall Restrictions Weight Bearing Restrictions: Yes LLE Weight Bearing: Non weight bearing Other Position/Activity Restrictions: has post-op shoe for Lt foot--? if MD will OK weightbearing thru heel    Mobility  Bed Mobility Overal bed mobility: Needs Assistance Bed Mobility: Supine to Sit     Supine to sit: Mod assist     General bed mobility comments: Needed assist and cues to  scoot to EOB  Transfers Overall transfer level: Needs assistance Equipment used: Rolling walker (2 wheeled) Transfers: Sit to/from [facility] Sit to Stand: Mod assist Stand pivot transfers: Mod assist       General transfer comment: vc for safe use of RW; physical assist to hold LLE in NWB; assist for balance  Ambulation/Gait                 Stairs            Wheelchair Mobility    Modified Rankin (Stroke Patients Only)       Balance Overall balance assessment: Needs assistance;History of  Falls Sitting-balance support: No upper extremity supported;Feet supported Sitting balance-Leahy Scale: Fair   Postural control: Posterior lean Standing balance support: Bilateral upper extremity supported;During functional activity Standing balance-Leahy Scale: Poor Standing balance comment: Bil UE support with RW and pt cannot hold LLE off floor needing assist to maintiain NWB.  Cannot achieve full upright stance.                 High Level Balance Comments: Had pt attempt to hop in RLE only v. weight bearing through left heel (in case pt is completely NWB'ing LLE post surgery--pt mod A)    Cognition Arousal/Alertness: Awake/alert Behavior During Therapy: Flat affect Overall Cognitive Status: History of cognitive impairments - at baseline Area of Impairment: Safety/judgement;Problem solving;Memory Orientation Level: Disoriented to;Time   Memory: Decreased short-term memory   Safety/Judgement: Decreased awareness of safety;Decreased awareness of deficits   Problem Solving: Slow processing;Decreased initiation;Difficulty sequencing;Requires verbal cues;Requires tactile cues General Comments: H/o dementia.  Incr time for processing how to scoot to edge of chair.  No family present to determine pt's baseline.    Exercises General Exercises - Lower Extremity Ankle Circles/Pumps: AROM;Right;10 reps;Supine;AAROM;Left Long Arc Quad: AROM;Both;10 reps;Seated Hip Flexion/Marching: AROM;Both;5 reps;Seated    General Comments        Pertinent Vitals/Pain VSS, 10/10 pain LLE    Home Living                      Prior Function  PT Goals (current goals can now be found in the care plan section) Progress towards PT goals: Progressing toward goals    Frequency  Min 3X/week    PT Plan Current plan remains appropriate    Co-evaluation             End of Session Equipment Utilized During Treatment: Gait belt Activity Tolerance: Patient limited by  pain Patient left: in chair;with call bell/phone within reach     Time: 0901-0925 PT Time Calculation (min): 24 min  Charges:  $Therapeutic Exercise: 8-22 mins $Therapeutic Activity: 8-22 mins                    G Codes:      Christianne Dolin Jun 05, 2013, 10:49 AM [facility] [phone] (pager)

## 2013-05-09 ENCOUNTER — Non-Acute Institutional Stay (SKILLED_NURSING_FACILITY): Payer: Medicare Other | Admitting: Nurse Practitioner

## 2013-05-09 DIAGNOSIS — E1159 Type 2 diabetes mellitus with other circulatory complications: Secondary | ICD-10-CM

## 2013-05-09 DIAGNOSIS — I739 Peripheral vascular disease, unspecified: Secondary | ICD-10-CM

## 2013-05-09 DIAGNOSIS — N183 Chronic kidney disease, stage 3 unspecified: Secondary | ICD-10-CM

## 2013-05-09 DIAGNOSIS — J449 Chronic obstructive pulmonary disease, unspecified: Secondary | ICD-10-CM

## 2013-05-09 DIAGNOSIS — Z8673 Personal history of transient ischemic attack (TIA), and cerebral infarction without residual deficits: Secondary | ICD-10-CM

## 2013-05-09 DIAGNOSIS — Z862 Personal history of diseases of the blood and blood-forming organs and certain disorders involving the immune mechanism: Secondary | ICD-10-CM

## 2013-05-09 DIAGNOSIS — E1151 Type 2 diabetes mellitus with diabetic peripheral angiopathy without gangrene: Secondary | ICD-10-CM

## 2013-05-09 DIAGNOSIS — I70269 Atherosclerosis of native arteries of extremities with gangrene, unspecified extremity: Secondary | ICD-10-CM

## 2013-05-09 DIAGNOSIS — I798 Other disorders of arteries, arterioles and capillaries in diseases classified elsewhere: Secondary | ICD-10-CM

## 2013-05-09 NOTE — Progress Notes (Signed)
Patient ID: Colin [name], male   DOB: 1928-04-08, 80 y.o.   MRN: [id number]    Nursing Home Location:  [place] of Service: SNF (21)  PCP: Horatio Pel, MD  Allergies  Allergen Reactions  . Lisinopril Other (See Comments)    REACTION:  Elevated potassium,renal insuf    Chief Complaint  Patient presents with  . Hospitalization Follow-up    HPI:  This is a 80 y.o. male patient of Dr. Donnetta Hutching is with a history of a failed right femoropopliteal bypass graft and right transmetatarsal amputation in 2006 who was hospitalized and taken to the OR due to gangrenous toe which resulted in amputation and fem pop. Pt is now here at [facility] for on-going therapies including PT/OT; staff concerned due to pts elevated blood sugars (in the 400s)  Review of Systems:  Review of Systems  Constitutional: Negative for fever, chills and malaise/fatigue.  Respiratory: Positive for shortness of breath (with exertion).   Cardiovascular: Negative for chest pain and claudication.  Gastrointestinal: Negative for heartburn, abdominal pain, diarrhea and constipation.  Genitourinary: Negative for dysuria.  Musculoskeletal: Positive for falls (in bathroom while at [facility], no injury noted). Negative for joint pain and myalgias.  Skin: Negative for itching and rash.  Neurological: Negative for dizziness, tingling and weakness.  Psychiatric/Behavioral: Positive for memory loss. Negative for depression. The patient is not nervous/anxious.      Past Medical History  Diagnosis Date  . Coronary artery disease     Status post CABG in 2004  . Peripheral vascular disease     Status post right femoropopliteal bypass  . Dyslipidemia   . Diabetes mellitus with peripheral artery disease   . Hypertension   . Chronic renal insufficiency     creatinine around 2.4  . CHF (congestive heart failure) 2007    ef 38%  . History of CVA (cerebrovascular accident)   . Cardiomyopathy     . COPD (chronic obstructive pulmonary disease)   . History of anemia   . Myocardial infarction   . Shortness of breath   . Pacemaker   . Pneumonia   . Anginal pain   . Dementia   . GERD (gastroesophageal reflux disease)   . Arthritis    Past Surgical History  Procedure Laterality Date  . Insert / replace / remove pacemaker  05/17/2005    st. jude dual chamber ddd mode   . Coronary artery bypass graft  4 of 2004    4 vessel bypass  . Femoral bypass  july 2006    right with right great toe amputation  . Cardiac catheterization  04/29/2002    Dr. Glade Lloyd  . Esophagogastroduodenoscopy  07/21/2011    Procedure: ESOPHAGOGASTRODUODENOSCOPY (EGD);  Surgeon: Beryle Beams, MD;  Location: [facility];  Service: Endoscopy;  Laterality: N/A;  . Coronary angioplasty    . Femoral-popliteal bypass graft Left 05/01/2013    Procedure: LEFT FEMORAL TO ABOVE KNEE POPLITEAL ARTERY BYPASS GRAFT WITH INTRAOPERATIVE ARTEROGRAM;  Surgeon: Mal Misty, MD;  Location: [place];  Service: Vascular;  Laterality: Left;  . Amputation Left 05/05/2013    Procedure: AMPUTATION RAY- FIRST AND SECOND-LEFT FOOT;  Surgeon: Newt Minion, MD;  Location: [place];  Service: Orthopedics;  Laterality: Left;   Social History:   reports that he quit smoking about 10 years ago. His smoking use included Cigarettes. He has a 50 pack-year smoking history. He has never used smokeless tobacco. He reports that he  does not drink alcohol or use illicit drugs.  Family History  Problem Relation Age of Onset  . Hyperlipidemia Mother   . Heart disease Father   . Diabetes    . Diabetes Sister   . Hypertension Sister   . Hyperlipidemia Sister     Medications: Patient's Medications  New Prescriptions   No medications on file  Previous Medications   ASPIRIN EC 81 MG EC TABLET    Take 4 tablets (325 mg total) by mouth daily.   B COMPLEX-C (B-COMPLEX WITH VITAMIN C) TABLET    Take 1 tablet by mouth daily.   CARVEDILOL (COREG) 6.25  MG TABLET    Take 1 tablet (6.25 mg total) by mouth 2 (two) times daily with a meal.   EZETIMIBE (ZETIA) 10 MG TABLET    Take 10 mg by mouth daily.   FUROSEMIDE (LASIX) 40 MG TABLET    Take 40 mg by mouth daily.   GLUCOSE 4 GM CHEWABLE TABLET    Chew 4 tablets (16 g total) by mouth as needed for low blood sugar.   HYDRALAZINE (APRESOLINE) 10 MG TABLET    Take 1 tablet (10 mg total) by mouth 3 (three) times daily. 8 HOURS APART LIKE 6 AM 2 PM 10 PM   ISOSORBIDE MONONITRATE (IMDUR) 30 MG 24 HR TABLET    Take 1 tablet (30 mg total) by mouth daily.   LINAGLIPTIN (TRADJENTA) 5 MG TABS TABLET    Take 5 mg by mouth daily.   OMEGA-3 FATTY ACIDS (FISH OIL) 1000 MG CAPS    Take 1,000 mg by mouth daily.    OMEPRAZOLE (PRILOSEC) 40 MG CAPSULE    Take 40 mg by mouth daily.   OXYCODONE (OXY IR/ROXICODONE) 5 MG IMMEDIATE RELEASE TABLET    Take 1 tablet (5 mg total) by mouth every 6 (six) hours as needed for moderate pain.   SODIUM POLYSTYRENE (KAYEXALATE) 15 GM/60ML SUSPENSION    Take 15 g by mouth 3 (three) times a week.  Modified Medications   No medications on file  Discontinued Medications   No medications on file     Physical Exam: Physical Exam  Constitutional: He appears well-developed and well-nourished.  HENT:  Head: Normocephalic and atraumatic.  Eyes: Conjunctivae and EOM are normal. Pupils are equal, round, and reactive to light.  Neck: Normal range of motion. Neck supple.  Cardiovascular: Normal rate, regular rhythm and normal heart sounds.   Pulmonary/Chest: Effort normal and breath sounds normal. No respiratory distress.  Abdominal: Soft. Bowel sounds are normal. He exhibits no distension.  Musculoskeletal: He exhibits no edema and no tenderness.  Neurological: He is alert.  Skin: Skin is warm and dry.  Left leg wrapped in curlex  Psychiatric: Cognition and memory are impaired. He expresses impulsivity.    Filed Vitals:   05/09/13 1434  BP: 120/57  Pulse: 67  Temp: 98 F (36.7  C)  Resp: 20  SpO2: 99%      Labs reviewed: Basic Metabolic Panel:  Recent Labs  05/02/13 0057 05/03/13 0436 05/05/13 0640 05/08/13 0215  NA 134* 133* 136*  --   K 3.8 4.2 4.2  --   CL 97 98 101  --   CO2 22 21 22   --   GLUCOSE 228* 195* 165*  --   BUN 43* 42* 47*  --   CREATININE 2.07* 2.41* 2.32* 2.40*  CALCIUM 8.6 8.7 8.7  --    Liver Function Tests:  Recent Labs  07/01/12 1430  04/30/13 2100  AST 36 16  ALT 25 15  ALKPHOS 81 61  BILITOT 0.3 0.4  PROT 7.5 7.4  ALBUMIN 3.2* 2.1*   No results found for this basename: LIPASE, AMYLASE,  in the last 8760 hours No results found for this basename: AMMONIA,  in the last 8760 hours CBC:  Recent Labs  07/01/12 1430  05/02/13 0057 05/03/13 0436 05/05/13 0640  WBC 6.9  < > 7.9 6.9 6.0  NEUTROABS 5.4  --   --   --   --   HGB 12.1*  < > 9.3* 8.6* 8.7*  HCT 39.1  < > 29.3* 27.1* 27.4*  MCV 86.1  < > 84.4 84.4 84.6  PLT 177  < > 285 281 289  < > = values in this interval not displayed. Cardiac Enzymes:  Recent Labs  07/01/12 1430 07/01/12 2038  TROPONINI <0.30 <0.30   BNP: No components found with this basename: POCBNP,  CBG:  Recent Labs  05/07/13 2109 05/08/13 0621 05/08/13 1114  GLUCAP 194* 196* 214*   TSH:  Recent Labs  07/01/12 1822  TSH 1.676   A1C: Lab Results  Component Value Date   HGBA1C 15.3* 04/30/2013     Assessment/Plan 1. Atherosclerosis of native arteries of the extremities with gangrene -s/p toe amputation  2. Diabetes mellitus with peripheral artery disease HGB A1C of 15! Pts blood sugars in the 400 at [facility], will start on 5 of lantus and give novolog 5 units for cbg over 150 with meals -conts on tradjenta  3. PAD (peripheral artery disease) -05/01/13 pt underwent left femoral to above knee popliteal artery bypass graft -conts asa   4. COPD (chronic obstructive pulmonary disease) -reports increase in shortness of breath at times mostly with exertion -will add  duonebs q 6 hours PRN  5. CKD (chronic kidney disease), stage III -will follow up bmp  6. History of anemia -will follow up cbc  7. History of CVA (cerebrovascular accident) -conts asa, blood pressure control and setia

## 2013-05-12 ENCOUNTER — Encounter: Payer: Self-pay | Admitting: Internal Medicine

## 2013-05-12 ENCOUNTER — Non-Acute Institutional Stay (SKILLED_NURSING_FACILITY): Payer: Medicare Other | Admitting: Internal Medicine

## 2013-05-12 DIAGNOSIS — I5022 Chronic systolic (congestive) heart failure: Secondary | ICD-10-CM

## 2013-05-12 DIAGNOSIS — I739 Peripheral vascular disease, unspecified: Secondary | ICD-10-CM

## 2013-05-12 DIAGNOSIS — I509 Heart failure, unspecified: Secondary | ICD-10-CM

## 2013-05-12 DIAGNOSIS — Z91199 Patient's noncompliance with other medical treatment and regimen due to unspecified reason: Secondary | ICD-10-CM

## 2013-05-12 DIAGNOSIS — Z9119 Patient's noncompliance with other medical treatment and regimen: Secondary | ICD-10-CM

## 2013-05-12 DIAGNOSIS — E785 Hyperlipidemia, unspecified: Secondary | ICD-10-CM

## 2013-05-12 DIAGNOSIS — E1100 Type 2 diabetes mellitus with hyperosmolarity without nonketotic hyperglycemic-hyperosmolar coma (NKHHC): Secondary | ICD-10-CM

## 2013-05-12 DIAGNOSIS — I70269 Atherosclerosis of native arteries of extremities with gangrene, unspecified extremity: Secondary | ICD-10-CM

## 2013-05-12 DIAGNOSIS — E1101 Type 2 diabetes mellitus with hyperosmolarity with coma: Secondary | ICD-10-CM

## 2013-05-12 DIAGNOSIS — Z950 Presence of cardiac pacemaker: Secondary | ICD-10-CM

## 2013-05-12 NOTE — Progress Notes (Signed)
MRN: [id number] Name: Colin [name]  Sex: male Age: 80 y.o. DOB: [birth year]  [place] #: Helene [facility]p Facility/Room: 129A Level Of Care: SNF Provider: Hennie Duos Emergency Contacts: Extended Emergency Contact Information Primary Emergency Contact: Lech,Mildred Address: [address]          [place], [place] [address] Johnnette Litter of [place] Phone: [phone] Mobile Phone: [phone] Relation: Spouse Secondary Emergency Contact: Kaylyn Layer, [place] of [place] Phone: [phone] Mobile Phone: [phone] Relation: Daughter  Code Status: FULL  Allergies: Lisinopril  Chief Complaint  Patient presents with  . nursing home admission    HPI: Patient is 80 y.o. male who is admitted for LOT/PTs/p fem pop and amputation 2 toes 2/2 gangrene.  Past Medical History  Diagnosis Date  . Coronary artery disease     Status post CABG in 2004  . Peripheral vascular disease     Status post right femoropopliteal bypass  . Dyslipidemia   . Diabetes mellitus with peripheral artery disease   . Hypertension   . Chronic renal insufficiency     creatinine around 2.4  . CHF (congestive heart failure) 2007    ef 38%  . History of CVA (cerebrovascular accident)   . Cardiomyopathy   . COPD (chronic obstructive pulmonary disease)   . History of anemia   . Myocardial infarction   . Shortness of breath   . Pacemaker   . Pneumonia   . Anginal pain   . Dementia   . GERD (gastroesophageal reflux disease)   . Arthritis     Past Surgical History  Procedure Laterality Date  . Insert / replace / remove pacemaker  05/17/2005    st. jude dual chamber ddd mode   . Coronary artery bypass graft  4 of 2004    4 vessel bypass  . Femoral bypass  july 2006    right with right great toe amputation  . Cardiac catheterization  04/29/2002    Dr. Glade Lloyd  . Esophagogastroduodenoscopy  07/21/2011    Procedure: ESOPHAGOGASTRODUODENOSCOPY (EGD);   Surgeon: Beryle Beams, MD;  Location: [facility];  Service: Endoscopy;  Laterality: N/A;  . Coronary angioplasty    . Femoral-popliteal bypass graft Left 05/01/2013    Procedure: LEFT FEMORAL TO ABOVE KNEE POPLITEAL ARTERY BYPASS GRAFT WITH INTRAOPERATIVE ARTEROGRAM;  Surgeon: Mal Misty, MD;  Location: [place];  Service: Vascular;  Laterality: Left;  . Amputation Left 05/05/2013    Procedure: AMPUTATION RAY- FIRST AND SECOND-LEFT FOOT;  Surgeon: Newt Minion, MD;  Location: [place];  Service: Orthopedics;  Laterality: Left;      Medication List       This list is accurate as of: 05/12/13 10:18 PM.  Always use your most recent med list.               aspirin 81 MG EC tablet  Take 4 tablets (325 mg total) by mouth daily.     B-complex with vitamin C tablet  Take 1 tablet by mouth daily.     carvedilol 6.25 MG tablet  Commonly known as:  COREG  Take 1 tablet (6.25 mg total) by mouth 2 (two) times daily with a meal.     ezetimibe 10 MG tablet  Commonly known as:  ZETIA  Take 10 mg by mouth daily.     Fish Oil 1000 MG Caps  Take 1,000 mg by mouth daily.     furosemide  40 MG tablet  Commonly known as:  LASIX  Take 40 mg by mouth daily.     glucose 4 GM chewable tablet  Chew 4 tablets (16 g total) by mouth as needed for low blood sugar.     hydrALAZINE 10 MG tablet  Commonly known as:  APRESOLINE  Take 1 tablet (10 mg total) by mouth 3 (three) times daily. 8 HOURS APART LIKE 6 AM 2 PM 10 PM     isosorbide mononitrate 30 MG 24 hr tablet  Commonly known as:  IMDUR  Take 1 tablet (30 mg total) by mouth daily.     linagliptin 5 MG Tabs tablet  Commonly known as:  TRADJENTA  Take 5 mg by mouth daily.     omeprazole 40 MG capsule  Commonly known as:  PRILOSEC  Take 40 mg by mouth daily.     oxyCODONE 5 MG immediate release tablet  Commonly known as:  Oxy IR/ROXICODONE  Take 1 tablet (5 mg total) by mouth every 6 (six) hours as needed for moderate pain.     sodium  polystyrene 15 GM/60ML suspension  Commonly known as:  KAYEXALATE  Take 15 g by mouth 3 (three) times a week.        No orders of the defined types were placed in this encounter.     There is no immunization history on file for this patient.  History  Substance Use Topics  . Smoking status: Former Smoker -- 1.00 packs/day for 50 years    Types: Cigarettes    Quit date: 06/09/2002  . Smokeless tobacco: Never Used  . Alcohol Use: No    Family history is noncontributory    Review of Systems  DATA OBTAINED: from patient GENERAL: Feels well no fevers, fatigue, appetite changes SKIN: No itching, rash EYES: No eye pain, redness, discharge EARS: No earache, tinnitus, change in hearing NOSE: No congestion, drainage or bleeding  MOUTH/THROAT: No mouth or tooth pain, No sore throat, No difficulty chewing or swallowing  RESPIRATORY: No cough, wheezing, SOB CARDIAC: No chest pain, palpitations, lower extremity edema  GI: No abdominal pain, No N/V/D or constipation, No heartburn or reflux  GU: No dysuria, frequency or urgency, or incontinence  MUSCULOSKELETAL: No unrelieved bone/joint pain NEUROLOGIC: No headache, dizziness or focal weakness PSYCHIATRIC: No overt anxiety or sadness. Sleeps well. No behavior issue.   Filed Vitals:   05/12/13 2200  BP: 122/78  Pulse: 64  Temp: 98.4 F (36.9 C)  Resp: 22    Physical Exam  GENERAL APPEARANCE: Alert, conversant. Appropriately groomed. No acute distress.  SKIN: No diaphoresis rash; wounds dressed;no heat or redness to exposed part of foot HEAD: Normocephalic, atraumatic  EYES: Conjunctiva/lids clear. Pupils round, reactive. EOMs intact.  EARS: External exam WNL, canals clear. Hearing grossly normal.  NOSE: No deformity or discharge.  MOUTH/THROAT: Lips w/o lesions.   RESPIRATORY: Breathing is even, unlabored. Lung sounds are clear   CARDIOVASCULAR: Heart RRR no murmurs, rubs or gallops. No peripheral edema.  GASTROINTESTINAL:  Abdomen is soft, non-tender, not distended w/ normal bowel sounds GENITOURINARY: Bladder non tender, not distended  MUSCULOSKELETAL: s/p L 2 toe amputation NEUROLOGIC: Cranial nerves 2-12 grossly intact. Moves all extremities no tremor. PSYCHIATRIC: Mood and affect appropriate to situation, no behavioral issues  Patient Active Problem List   Diagnosis Date Noted  . PAD (peripheral artery disease) 04/30/2013  . Atherosclerosis of native arteries of the extremities with gangrene 04/29/2013  . Other stiff joint, of the lower leg  12/31/2012  . Fall 07/01/2012  . Hypoglycemia 07/01/2012  . Hypothermia 07/01/2012  . Type 2 diabetes mellitus with hyperosmolar nonketotic hyperglycemia 02/27/2012  . Failure to thrive in adult 02/27/2012  . Non-compliant patient 02/27/2012  . Microscopic hematuria 01/11/2012  . Syncope 01/09/2012  . CKD (chronic kidney disease), stage III 01/09/2012  . Diabetes mellitus with peripheral artery disease   . Pain in limb 12/29/2011  . Peripheral vascular disease, unspecified 12/29/2011  . DOE (dyspnea on exertion) 04/10/2011  . Chronic systolic CHF (congestive heart failure)   . Peripheral vascular disease   . Dyslipidemia   . Acute kidney injury   . History of CVA (cerebrovascular accident)   . Cardiomyopathy   . COPD (chronic obstructive pulmonary disease)   . History of anemia   . CORONARY ARTERY DISEASE 11/30/2009  . Cardiac pacemaker in situ 11/30/2009    CBC    Component Value Date/Time   WBC 6.0 05/05/2013 0640   RBC 3.24* 05/05/2013 0640   HGB 8.7* 05/05/2013 0640   HCT 27.4* 05/05/2013 0640   PLT 289 05/05/2013 0640   MCV 84.6 05/05/2013 0640   LYMPHSABS 1.0 07/01/2012 1430   MONOABS 0.4 07/01/2012 1430   EOSABS 0.1 07/01/2012 1430   BASOSABS 0.0 07/01/2012 1430    CMP     Component Value Date/Time   NA 136* 05/05/2013 0640   K 4.2 05/05/2013 0640   CL 101 05/05/2013 0640   CO2 22 05/05/2013 0640   GLUCOSE 165* 05/05/2013 0640   BUN 47* 05/05/2013  0640   CREATININE 2.40* 05/08/2013 0215   CALCIUM 8.7 05/05/2013 0640   PROT 7.4 04/30/2013 2100   ALBUMIN 2.1* 04/30/2013 2100   AST 16 04/30/2013 2100   ALT 15 04/30/2013 2100   ALKPHOS 61 04/30/2013 2100   BILITOT 0.4 04/30/2013 2100   GFRNONAA 24* 05/08/2013 0215   GFRAA 27* 05/08/2013 0215    Assessment and Plan  Peripheral vascular disease S/p  Fem-pop; ASA, zetia, Bp CONTROL  Atherosclerosis of native arteries of the extremities with gangrene S/P TOE 1 AND 2 amputation  Chronic systolic CHF (congestive heart failure) BBlocker, lasix  Type 2 diabetes mellitus with hyperosmolar nonketotic hyperglycemia Uncontrolled with A1c 15; pt on tradgenta, cr too high for glucophage;hx non compliance so insulin may be a problem  Dyslipidemia Zetia, fish oil    Hennie Duos, MD

## 2013-05-12 NOTE — Assessment & Plan Note (Signed)
BBlocker, lasix

## 2013-05-12 NOTE — Assessment & Plan Note (Signed)
S/P TOE 1 AND 2 amputation

## 2013-05-12 NOTE — Assessment & Plan Note (Signed)
Uncontrolled with A1c 15; pt on tradgenta, cr too high for glucophage;hx non compliance so insulin may be a problem

## 2013-05-12 NOTE — Assessment & Plan Note (Signed)
S/p  Fem-pop; ASA, zetia, Bp CONTROL

## 2013-05-12 NOTE — Assessment & Plan Note (Signed)
Zetia, fish oil

## 2013-05-14 ENCOUNTER — Encounter: Payer: Self-pay | Admitting: Family

## 2013-05-15 ENCOUNTER — Ambulatory Visit: Payer: Medicare Other | Admitting: Family

## 2013-05-16 ENCOUNTER — Encounter: Payer: Self-pay | Admitting: Family

## 2013-05-16 ENCOUNTER — Ambulatory Visit (INDEPENDENT_AMBULATORY_CARE_PROVIDER_SITE_OTHER): Payer: Self-pay | Admitting: Family

## 2013-05-16 VITALS — BP 166/74 | HR 59 | Resp 20 | Ht 71.0 in

## 2013-05-16 DIAGNOSIS — I70269 Atherosclerosis of native arteries of extremities with gangrene, unspecified extremity: Secondary | ICD-10-CM

## 2013-05-16 DIAGNOSIS — Z48812 Encounter for surgical aftercare following surgery on the circulatory system: Secondary | ICD-10-CM

## 2013-05-16 NOTE — Progress Notes (Signed)
[facility] [place] HISTORY AND PHYSICAL -PAD  History of Present Illness Colin [name] is a 80 y.o. male patient of Dr. Kellie Simmering who is s/p left fem to above knee popliteal artery bypass graft using 6 mm gortex propaten on 05/01/2013. During the same surgery Dr. Sharol Given amputated left 1st and second toes. He returns today for 2 weeks post check. He is getting rehab at [facility] and rehab center, he will then return to home.  He has a history of a failed right femoropopliteal bypass graft and right transmetatarsal amputation in 2006.   Pt Diabetic: Yes, states in fairly good control  Pt smoker: former smoker, quit 2004 when he had his CABG  Pt meds include:  Statin :No, takes ezetimibe  Betablocker: Yes  ASA: Yes  Other anticoagulants/antiplatelets: no    Past Medical History  Diagnosis Date  . Coronary artery disease     Status post CABG in 2004  . Peripheral vascular disease     Status post right femoropopliteal bypass  . Dyslipidemia   . Diabetes mellitus with peripheral artery disease   . Hypertension   . Chronic renal insufficiency     creatinine around 2.4  . CHF (congestive heart failure) 2007    ef 38%  . History of CVA (cerebrovascular accident)   . Cardiomyopathy   . COPD (chronic obstructive pulmonary disease)   . History of anemia   . Myocardial infarction   . Shortness of breath   . Pacemaker   . Pneumonia   . Anginal pain   . Dementia   . GERD (gastroesophageal reflux disease)   . Arthritis     Social History History  Substance Use Topics  . Smoking status: Former Smoker -- 1.00 packs/day for 50 years    Types: Cigarettes    Quit date: 06/09/2002  . Smokeless tobacco: Never Used  . Alcohol Use: No    Family History Family History  Problem Relation Age of Onset  . Hyperlipidemia Mother   . Heart disease Father   . Diabetes    . Diabetes Sister   . Hypertension Sister   . Hyperlipidemia Sister     Past Surgical  History  Procedure Laterality Date  . Insert / replace / remove pacemaker  05/17/2005    st[employer] jude dual chamber ddd mode   . Coronary artery bypass graft  4 of 2004    4 vessel bypass  . Femoral bypass  july 2006    right with right great toe amputation  . Cardiac catheterization  04/29/2002    Dr. Glade Lloyd  . Esophagogastroduodenoscopy  07/21/2011    Procedure: ESOPHAGOGASTRODUODENOSCOPY (EGD);  Surgeon: Beryle Beams, MD;  Location: [facility];  Service: Endoscopy;  Laterality: N/A;  . Coronary angioplasty    . Femoral-popliteal bypass graft Left 05/01/2013    Procedure: LEFT FEMORAL TO ABOVE KNEE POPLITEAL ARTERY BYPASS GRAFT WITH INTRAOPERATIVE ARTEROGRAM;  Surgeon: Mal Misty, MD;  Location: [place];  Service: Vascular;  Laterality: Left;  . Amputation Left 05/05/2013    Procedure: AMPUTATION RAY- FIRST AND SECOND-LEFT FOOT;  Surgeon: Newt Minion, MD;  Location: [place];  Service: Orthopedics;  Laterality: Left;    Allergies  Allergen Reactions  . Lisinopril Other (See Comments)    REACTION:  Elevated potassium,renal insuf    Current Outpatient Prescriptions  Medication Sig Dispense Refill  . aspirin EC 81 MG EC tablet Take 4 tablets (325 mg total) by mouth daily.      Marland Kitchen  B Complex-C (B-COMPLEX WITH VITAMIN C) tablet Take 1 tablet by mouth daily.      . carvedilol (COREG) 6.25 MG tablet Take 1 tablet (6.25 mg total) by mouth 2 (two) times daily with a meal.  60 tablet  5  . ezetimibe (ZETIA) 10 MG tablet Take 10 mg by mouth daily.      . furosemide (LASIX) 40 MG tablet Take 40 mg by mouth daily.      . hydrALAZINE (APRESOLINE) 10 MG tablet Take 1 tablet (10 mg total) by mouth 3 (three) times daily. 8 HOURS APART LIKE 6 AM 2 PM 10 PM  90 tablet  6  . insulin aspart (NOVOLOG) 100 UNIT/ML injection Inject 5 Units into the skin 3 (three) times daily before meals. For CBG > 150      . insulin glargine (LANTUS) 100 UNIT/ML injection Inject 5 Units into the skin at bedtime.      Marland Kitchen  ipratropium-albuterol (DUONEB) 0.5-2.5 (3) MG/3ML SOLN Take 3 mLs by nebulization every 6 (six) hours as needed.      . isosorbide mononitrate (IMDUR) 30 MG 24 hr tablet Take 1 tablet (30 mg total) by mouth daily.  90 tablet  1  . linagliptin (TRADJENTA) 5 MG TABS tablet Take 5 mg by mouth daily.      . Omega-3 Fatty Acids (FISH OIL) 1000 MG CAPS Take 1,000 mg by mouth daily.       Marland Kitchen omeprazole (PRILOSEC) 40 MG capsule Take 40 mg by mouth daily.      Marland Kitchen oxyCODONE (OXY IR/ROXICODONE) 5 MG immediate release tablet Take 1 tablet (5 mg total) by mouth every 6 (six) hours as needed for moderate pain.  30 tablet  0  . sodium polystyrene (KAYEXALATE) 15 GM/60ML suspension Take 15 g by mouth 3 (three) times a week.      Marland Kitchen glucose 4 GM chewable tablet Chew 4 tablets (16 g total) by mouth as needed for low blood sugar.  50 tablet  12   No current facility-administered medications for this visit.    ROS: See HPI for pertinent positives and negatives.   Physical Examination  Filed Vitals:   05/16/13 1312  BP: 166/74  Pulse: 59  Resp: 20    General: A&O x 3, WDWN. Gait: in wheelchair Eyes: No gross abnormalities Pulmonary: CTAB, without wheezes , rales or rhonchi. Cardiac: regular Rythm , without detected murmur.         Carotid Bruits Left Right   Negative Negative  Aorta is not palpable. Radial pulses: 1+ palpable and =.  VASCULAR EXAM:  Extremities with ischemic changes : left great toe is gangrenous, swollen, foul smelling, draining serosanguinous fluid, left lower foot is slightly swollen, slightly red.  LE Pulses  LEFT  RIGHT   FEMORAL  palpable  palpable   POPLITEAL  not palpable  not palpable   POSTERIOR TIBIAL  not palpable  not palpable   DORSALIS PEDIS  ANTERIOR TIBIAL  not palpable  not palpable   Abdomen: soft, NT, no masses.  Skin: see extremities.  Musculoskeletal: s/p transmetatarsal amputation right foot.  Neurologic: A&O X 3; Appropriate Affect ; SENSATION: normal;  MOTOR FUNCTION: moving all extremities equally. Speech is fluent/normal. CN 2-12 grossly intact.  Non-Invasive Vascular Imaging: DATE: 04/29/2013 (before  ABI: RIGHT 0.55, Waveforms: monophasic; LEFT 0.52, Waveforms: monophasic  Previous (12/31/2012) ABI's: Right: 0.32, Left: 0.59      ASSESSMENT: Colin [name] is a 80 y.o. male who is s/p left  fem to above knee popliteal artery bypass graft using 6 mm gortex propaten on 05/01/2013. During the same surgery Dr. Sharol Given amputated left 1st and second toes. He returns today for 2 weeks post check. He is getting rehab at [facility] and rehab center, he will then return to home.  He has a history of a failed right femoropopliteal bypass graft and right transmetatarsal amputation in 2006.  Left leg incisions from revascularization procedure are healing well without s/s's of infection, edges well proximated.  PLAN:  I discussed in depth with the patient the nature of atherosclerosis, and emphasized the importance of maximal medical management including strict control of blood pressure, blood glucose, and lipid levels, obtaining regular exercise, and continued cessation of smoking.  The patient is aware that without maximal medical management the underlying atherosclerotic disease process will progress, limiting the benefit of any interventions.  Based on the patient's vascular studies and examination, and after Dr. Donnetta Hutching spoke with pt and examined his left foot and leg, pt will return to clinic in 3 months for ABI's and see Dr. Kellie Simmering. He already has an appointment scheduled with Dr. Sharol Given at 8:30 AM on Monday, 05/19/2013.   The patient was given information about PAD including signs, symptoms, treatment, what symptoms should prompt the patient to seek immediate medical care, and risk reduction measures to take.  Clemon Chambers, RN, MSN, FNP-C [facility] [employer] Phone: [phone]  Clinic MD: Early  05/16/2013  1:38 PM

## 2013-06-03 ENCOUNTER — Non-Acute Institutional Stay (SKILLED_NURSING_FACILITY): Payer: Medicare Other | Admitting: Nurse Practitioner

## 2013-06-03 ENCOUNTER — Encounter: Payer: Self-pay | Admitting: Nurse Practitioner

## 2013-06-03 DIAGNOSIS — R413 Other amnesia: Secondary | ICD-10-CM

## 2013-06-03 DIAGNOSIS — N183 Chronic kidney disease, stage 3 unspecified: Secondary | ICD-10-CM

## 2013-06-03 DIAGNOSIS — E1129 Type 2 diabetes mellitus with other diabetic kidney complication: Secondary | ICD-10-CM | POA: Insufficient documentation

## 2013-06-03 DIAGNOSIS — IMO0002 Reserved for concepts with insufficient information to code with codable children: Secondary | ICD-10-CM

## 2013-06-03 DIAGNOSIS — E1165 Type 2 diabetes mellitus with hyperglycemia: Secondary | ICD-10-CM

## 2013-06-03 DIAGNOSIS — E785 Hyperlipidemia, unspecified: Secondary | ICD-10-CM

## 2013-06-03 DIAGNOSIS — I509 Heart failure, unspecified: Secondary | ICD-10-CM

## 2013-06-03 DIAGNOSIS — I70269 Atherosclerosis of native arteries of extremities with gangrene, unspecified extremity: Secondary | ICD-10-CM

## 2013-06-03 DIAGNOSIS — I5022 Chronic systolic (congestive) heart failure: Secondary | ICD-10-CM

## 2013-06-03 NOTE — Progress Notes (Signed)
Patient ID: Colin [name], male   DOB: Sep 02, 1928, 80 y.o.   MRN: [id number]    Nursing Home Location:  [place] of Service: SNF (54)  PCP: Horatio Pel, MD  Allergies  Allergen Reactions  . Lisinopril Other (See Comments)    REACTION:  Elevated potassium,renal insuf    Chief Complaint  Patient presents with  . Discharge Note    HPI:  This is a 80 y.o. male patient of Dr. Donnetta Hutching is with a history of a failed right femoropopliteal bypass graft and right transmetatarsal amputation in 2006 who was hospitalized and taken to the OR due to gangrenous toe which resulted in amputation and fem pop. Pt is now here at [facility] for on-going therapies including PT/OT; Patient currently doing well with therapy, now stable to discharge home with family and home health therapies.  Blood sugars have improved since pt has been on insulin, no noted hypoglycemic episodes. Pt reports pain is well controlled.   Review of Systems:  Review of Systems  Constitutional: Negative for fever, chills and malaise/fatigue.  Respiratory: Negative for cough and shortness of breath.   Cardiovascular: Negative for chest pain and claudication.  Gastrointestinal: Negative for heartburn, abdominal pain, diarrhea and constipation.  Genitourinary: Negative for dysuria.  Musculoskeletal: Negative for falls, joint pain and myalgias.  Skin: Negative for itching and rash.  Neurological: Negative for dizziness, tingling, weakness and headaches.  Psychiatric/Behavioral: Positive for memory loss. Negative for depression. The patient is not nervous/anxious.      Past Medical History  Diagnosis Date  . Coronary artery disease     Status post CABG in 2004  . Peripheral vascular disease     Status post right femoropopliteal bypass  . Dyslipidemia   . Diabetes mellitus with peripheral artery disease   . Hypertension   . Chronic renal insufficiency     creatinine around 2.4  . CHF  (congestive heart failure) 2007    ef 38%  . History of CVA (cerebrovascular accident)   . Cardiomyopathy   . COPD (chronic obstructive pulmonary disease)   . History of anemia   . Myocardial infarction   . Shortness of breath   . Pacemaker   . Pneumonia   . Anginal pain   . Dementia   . GERD (gastroesophageal reflux disease)   . Arthritis    Past Surgical History  Procedure Laterality Date  . Insert / replace / remove pacemaker  05/17/2005    st. jude dual chamber ddd mode   . Coronary artery bypass graft  4 of 2004    4 vessel bypass  . Femoral bypass  july 2006    right with right great toe amputation  . Cardiac catheterization  04/29/2002    Dr. Glade Lloyd  . Esophagogastroduodenoscopy  07/21/2011    Procedure: ESOPHAGOGASTRODUODENOSCOPY (EGD);  Surgeon: Beryle Beams, MD;  Location: [facility];  Service: Endoscopy;  Laterality: N/A;  . Coronary angioplasty    . Femoral-popliteal bypass graft Left 05/01/2013    Procedure: LEFT FEMORAL TO ABOVE KNEE POPLITEAL ARTERY BYPASS GRAFT WITH INTRAOPERATIVE ARTEROGRAM;  Surgeon: Mal Misty, MD;  Location: [place];  Service: Vascular;  Laterality: Left;  . Amputation Left 05/05/2013    Procedure: AMPUTATION RAY- FIRST AND SECOND-LEFT FOOT;  Surgeon: Newt Minion, MD;  Location: [place];  Service: Orthopedics;  Laterality: Left;   Social History:   reports that he quit smoking about 10 years ago. His smoking use included  Cigarettes. He has a 50 pack-year smoking history. He has never used smokeless tobacco. He reports that he does not drink alcohol or use illicit drugs.  Family History  Problem Relation Age of Onset  . Hyperlipidemia Mother   . Heart disease Father   . Diabetes    . Diabetes Sister   . Hypertension Sister   . Hyperlipidemia Sister     Medications: Patient's Medications  New Prescriptions   No medications on file  Previous Medications   ASPIRIN EC 81 MG EC TABLET    Take 4 tablets (325 mg total) by mouth daily.     B COMPLEX-C (B-COMPLEX WITH VITAMIN C) TABLET    Take 1 tablet by mouth daily.   CARVEDILOL (COREG) 6.25 MG TABLET    Take 1 tablet (6.25 mg total) by mouth 2 (two) times daily with a meal.   EZETIMIBE (ZETIA) 10 MG TABLET    Take 10 mg by mouth daily.   FUROSEMIDE (LASIX) 40 MG TABLET    Take 40 mg by mouth daily.   GLUCOSE 4 GM CHEWABLE TABLET    Chew 4 tablets (16 g total) by mouth as needed for low blood sugar.   HYDRALAZINE (APRESOLINE) 10 MG TABLET    Take 1 tablet (10 mg total) by mouth 3 (three) times daily. 8 HOURS APART LIKE 6 AM 2 PM 10 PM   INSULIN ASPART (NOVOLOG) 100 UNIT/ML INJECTION    Inject 5 Units into the skin 3 (three) times daily before meals. For CBG > 150   INSULIN GLARGINE (LANTUS) 100 UNIT/ML INJECTION    Inject 5 Units into the skin at bedtime.   IPRATROPIUM-ALBUTEROL (DUONEB) 0.5-2.5 (3) MG/3ML SOLN    Take 3 mLs by nebulization every 6 (six) hours as needed.   ISOSORBIDE MONONITRATE (IMDUR) 30 MG 24 HR TABLET    Take 1 tablet (30 mg total) by mouth daily.   LINAGLIPTIN (TRADJENTA) 5 MG TABS TABLET    Take 5 mg by mouth daily.   OMEGA-3 FATTY ACIDS (FISH OIL) 1000 MG CAPS    Take 1,000 mg by mouth daily.    OMEPRAZOLE (PRILOSEC) 40 MG CAPSULE    Take 40 mg by mouth daily.   OXYCODONE (OXY IR/ROXICODONE) 5 MG IMMEDIATE RELEASE TABLET    Take 1 tablet (5 mg total) by mouth every 6 (six) hours as needed for moderate pain.   SODIUM POLYSTYRENE (KAYEXALATE) 15 GM/60ML SUSPENSION    Take 15 g by mouth 3 (three) times a week.  Modified Medications   No medications on file  Discontinued Medications   No medications on file     Physical Exam:  Filed Vitals:   06/03/13 1244  BP: 150/60  Pulse: 90  Temp: 98.2 F (36.8 C)  Resp: 20   Physical Exam  Constitutional: He is well-developed, well-nourished, and in no distress.  HENT:  Mouth/Throat: Oropharynx is clear and moist. No oropharyngeal exudate.  Eyes: Conjunctivae and EOM are normal. Pupils are equal, round,  and reactive to light.  Neck: Normal range of motion. Neck supple.  Cardiovascular: Normal rate, regular rhythm and normal heart sounds.   Pulmonary/Chest: Effort normal and breath sounds normal.  Abdominal: Soft. Bowel sounds are normal.  Musculoskeletal: He exhibits no edema and no tenderness.   L 2 toe amputation  Neurological: He is alert.  Skin: Skin is warm and dry.  Psychiatric: He exhibits abnormal recent memory.      Labs reviewed: Basic Metabolic Panel:  Recent Labs  05/02/13 0057 05/03/13 0436 05/05/13 0640 05/08/13 0215  NA 134* 133* 136*  --   K 3.8 4.2 4.2  --   CL 97 98 101  --   CO2 22 21 22   --   GLUCOSE 228* 195* 165*  --   BUN 43* 42* 47*  --   CREATININE 2.07* 2.41* 2.32* 2.40*  CALCIUM 8.6 8.7 8.7  --    Liver Function Tests:  Recent Labs  07/01/12 1430 04/30/13 2100  AST 36 16  ALT 25 15  ALKPHOS 81 61  BILITOT 0.3 0.4  PROT 7.5 7.4  ALBUMIN 3.2* 2.1*   No results found for this basename: LIPASE, AMYLASE,  in the last 8760 hours No results found for this basename: AMMONIA,  in the last 8760 hours CBC:  Recent Labs  07/01/12 1430  05/02/13 0057 05/03/13 0436 05/05/13 0640  WBC 6.9  < > 7.9 6.9 6.0  NEUTROABS 5.4  --   --   --   --   HGB 12.1*  < > 9.3* 8.6* 8.7*  HCT 39.1  < > 29.3* 27.1* 27.4*  MCV 86.1  < > 84.4 84.4 84.6  PLT 177  < > 285 281 289  < > = values in this interval not displayed. Cardiac Enzymes:  Recent Labs  07/01/12 1430 07/01/12 2038  TROPONINI <0.30 <0.30   BNP: No components found with this basename: POCBNP,  CBG:  Recent Labs  05/07/13 2109 05/08/13 0621 05/08/13 1114  GLUCAP 194* 196* 214*   TSH:  Recent Labs  07/01/12 1822  TSH 1.676   A1C: Lab Results  Component Value Date   HGBA1C 15.3* 04/30/2013     Assessment/Plan 1. Atherosclerosis of native arteries of the extremities with gangrene -s/p  Toe amputation due to non healing ulcer and gangrene -cont to follow up with Ortho  and Vein and vascular, requiring daily dressing changes at this time.   2. Chronic systolic CHF (congestive heart failure) -stable on current medications, no signs of worsening CHF  3. CKD (chronic kidney disease), stage III -stable  4. Type 2 diabetes, uncontrolled, with renal manifestation -pt has been non-complaint in the past, with his memory loss he needs assistance at home with mediations, SW here at [facility] to help get assistance at home -will get [facility] nursing for ongoing teaching and diabetic management -will stop novolog and increase lantus to 10 units for home  -will need to cont tradjenta   5. Dyslipidemia -conts zetia  6. Memory loss -scoring mild- to moderate memory loss at [facility], will need ongoing follow up with PCP  -pt reports he was driving but will not drive at discharge -will get home health SW and family involved in pts care  7. PAD (peripheral artery disease)  -05/01/13 pt underwent left femoral to above knee popliteal artery bypass graft  -conts asa   pt is stable for discharge-will need PT/OT/Nursing/HHA/SW per home health.  DME needed including home blood sugar meter, WC, BSC. Rx written.  will need to follow up with PCP within 2 weeks.

## 2013-06-27 ENCOUNTER — Encounter: Payer: Self-pay | Admitting: Cardiology

## 2013-07-02 ENCOUNTER — Ambulatory Visit: Payer: Medicare Other | Admitting: Cardiovascular Disease

## 2013-08-07 ENCOUNTER — Telehealth: Payer: Self-pay | Admitting: Hematology and Oncology

## 2013-08-07 NOTE — Telephone Encounter (Signed)
S/W PATIENT AND GAVE NP APPT FOR 07/27 @ 10:45 W/DR. [place]. REFERRING DR. Thayer Jew PHARR DX- IDA

## 2013-08-14 ENCOUNTER — Other Ambulatory Visit: Payer: Self-pay | Admitting: Cardiovascular Disease

## 2013-08-18 ENCOUNTER — Ambulatory Visit: Payer: Medicare Other

## 2013-08-18 ENCOUNTER — Encounter: Payer: Self-pay | Admitting: Vascular Surgery

## 2013-08-18 ENCOUNTER — Ambulatory Visit: Payer: Medicare Other | Admitting: Hematology and Oncology

## 2013-08-19 ENCOUNTER — Ambulatory Visit: Payer: Medicare Other | Admitting: Vascular Surgery

## 2013-08-19 ENCOUNTER — Inpatient Hospital Stay (HOSPITAL_COMMUNITY): Admission: RE | Admit: 2013-08-19 | Payer: Medicare Other | Source: Ambulatory Visit

## 2013-09-26 ENCOUNTER — Other Ambulatory Visit: Payer: Self-pay | Admitting: Cardiovascular Disease

## 2013-10-19 ENCOUNTER — Emergency Department (HOSPITAL_COMMUNITY): Payer: Medicare Other

## 2013-10-19 ENCOUNTER — Encounter (HOSPITAL_COMMUNITY): Payer: Self-pay | Admitting: Emergency Medicine

## 2013-10-19 ENCOUNTER — Inpatient Hospital Stay (HOSPITAL_COMMUNITY)
Admission: EM | Admit: 2013-10-19 | Discharge: 2013-10-31 | DRG: 260 | Disposition: A | Discharge status: Skilled Nursing Facility | Payer: Medicare Other | Attending: Internal Medicine | Admitting: Internal Medicine

## 2013-10-19 DIAGNOSIS — Z8249 Family history of ischemic heart disease and other diseases of the circulatory system: Secondary | ICD-10-CM | POA: Diagnosis not present

## 2013-10-19 DIAGNOSIS — Z862 Personal history of diseases of the blood and blood-forming organs and certain disorders involving the immune mechanism: Secondary | ICD-10-CM

## 2013-10-19 DIAGNOSIS — T827XXA Infection and inflammatory reaction due to other cardiac and vascular devices, implants and grafts, initial encounter: Secondary | ICD-10-CM | POA: Diagnosis present

## 2013-10-19 DIAGNOSIS — Z951 Presence of aortocoronary bypass graft: Secondary | ICD-10-CM | POA: Diagnosis not present

## 2013-10-19 DIAGNOSIS — N183 Chronic kidney disease, stage 3 unspecified: Secondary | ICD-10-CM | POA: Diagnosis not present

## 2013-10-19 DIAGNOSIS — Z6825 Body mass index (BMI) 25.0-25.9, adult: Secondary | ICD-10-CM

## 2013-10-19 DIAGNOSIS — Y831 Surgical operation with implant of artificial internal device as the cause of abnormal reaction of the patient, or of later complication, without mention of misadventure at the time of the procedure: Secondary | ICD-10-CM | POA: Diagnosis present

## 2013-10-19 DIAGNOSIS — J449 Chronic obstructive pulmonary disease, unspecified: Secondary | ICD-10-CM | POA: Diagnosis present

## 2013-10-19 DIAGNOSIS — K5900 Constipation, unspecified: Secondary | ICD-10-CM | POA: Diagnosis not present

## 2013-10-19 DIAGNOSIS — E1151 Type 2 diabetes mellitus with diabetic peripheral angiopathy without gangrene: Secondary | ICD-10-CM

## 2013-10-19 DIAGNOSIS — E1140 Type 2 diabetes mellitus with diabetic neuropathy, unspecified: Secondary | ICD-10-CM | POA: Diagnosis present

## 2013-10-19 DIAGNOSIS — I429 Cardiomyopathy, unspecified: Secondary | ICD-10-CM | POA: Diagnosis present

## 2013-10-19 DIAGNOSIS — I5022 Chronic systolic (congestive) heart failure: Secondary | ICD-10-CM | POA: Diagnosis present

## 2013-10-19 DIAGNOSIS — N179 Acute kidney failure, unspecified: Secondary | ICD-10-CM | POA: Diagnosis not present

## 2013-10-19 DIAGNOSIS — R531 Weakness: Secondary | ICD-10-CM

## 2013-10-19 DIAGNOSIS — Z794 Long term (current) use of insulin: Secondary | ICD-10-CM | POA: Diagnosis not present

## 2013-10-19 DIAGNOSIS — Z950 Presence of cardiac pacemaker: Secondary | ICD-10-CM | POA: Diagnosis not present

## 2013-10-19 DIAGNOSIS — B952 Enterococcus as the cause of diseases classified elsewhere: Secondary | ICD-10-CM | POA: Diagnosis present

## 2013-10-19 DIAGNOSIS — I252 Old myocardial infarction: Secondary | ICD-10-CM

## 2013-10-19 DIAGNOSIS — Z9181 History of falling: Secondary | ICD-10-CM | POA: Diagnosis not present

## 2013-10-19 DIAGNOSIS — Z8673 Personal history of transient ischemic attack (TIA), and cerebral infarction without residual deficits: Secondary | ICD-10-CM

## 2013-10-19 DIAGNOSIS — Z87891 Personal history of nicotine dependence: Secondary | ICD-10-CM

## 2013-10-19 DIAGNOSIS — Z833 Family history of diabetes mellitus: Secondary | ICD-10-CM | POA: Diagnosis not present

## 2013-10-19 DIAGNOSIS — I2510 Atherosclerotic heart disease of native coronary artery without angina pectoris: Secondary | ICD-10-CM | POA: Diagnosis present

## 2013-10-19 DIAGNOSIS — N189 Chronic kidney disease, unspecified: Secondary | ICD-10-CM

## 2013-10-19 DIAGNOSIS — F0390 Unspecified dementia without behavioral disturbance: Secondary | ICD-10-CM | POA: Diagnosis not present

## 2013-10-19 DIAGNOSIS — E46 Unspecified protein-calorie malnutrition: Secondary | ICD-10-CM | POA: Diagnosis present

## 2013-10-19 DIAGNOSIS — D62 Acute posthemorrhagic anemia: Secondary | ICD-10-CM | POA: Diagnosis present

## 2013-10-19 DIAGNOSIS — I379 Nonrheumatic pulmonary valve disorder, unspecified: Secondary | ICD-10-CM

## 2013-10-19 DIAGNOSIS — D638 Anemia in other chronic diseases classified elsewhere: Secondary | ICD-10-CM | POA: Diagnosis not present

## 2013-10-19 DIAGNOSIS — I70203 Unspecified atherosclerosis of native arteries of extremities, bilateral legs: Secondary | ICD-10-CM | POA: Diagnosis present

## 2013-10-19 DIAGNOSIS — E1122 Type 2 diabetes mellitus with diabetic chronic kidney disease: Secondary | ICD-10-CM | POA: Diagnosis present

## 2013-10-19 DIAGNOSIS — E1169 Type 2 diabetes mellitus with other specified complication: Secondary | ICD-10-CM | POA: Diagnosis not present

## 2013-10-19 DIAGNOSIS — Z9109 Other allergy status, other than to drugs and biological substances: Secondary | ICD-10-CM

## 2013-10-19 DIAGNOSIS — E1129 Type 2 diabetes mellitus with other diabetic kidney complication: Secondary | ICD-10-CM

## 2013-10-19 DIAGNOSIS — R7881 Bacteremia: Secondary | ICD-10-CM | POA: Diagnosis present

## 2013-10-19 DIAGNOSIS — D631 Anemia in chronic kidney disease: Secondary | ICD-10-CM | POA: Diagnosis present

## 2013-10-19 DIAGNOSIS — I129 Hypertensive chronic kidney disease with stage 1 through stage 4 chronic kidney disease, or unspecified chronic kidney disease: Secondary | ICD-10-CM | POA: Diagnosis not present

## 2013-10-19 DIAGNOSIS — I330 Acute and subacute infective endocarditis: Secondary | ICD-10-CM | POA: Diagnosis present

## 2013-10-19 DIAGNOSIS — E1165 Type 2 diabetes mellitus with hyperglycemia: Secondary | ICD-10-CM | POA: Diagnosis present

## 2013-10-19 DIAGNOSIS — Z91199 Patient's noncompliance with other medical treatment and regimen due to unspecified reason: Secondary | ICD-10-CM

## 2013-10-19 DIAGNOSIS — IMO0002 Reserved for concepts with insufficient information to code with codable children: Secondary | ICD-10-CM | POA: Diagnosis present

## 2013-10-19 DIAGNOSIS — K219 Gastro-esophageal reflux disease without esophagitis: Secondary | ICD-10-CM | POA: Diagnosis not present

## 2013-10-19 DIAGNOSIS — Z7982 Long term (current) use of aspirin: Secondary | ICD-10-CM | POA: Diagnosis not present

## 2013-10-19 DIAGNOSIS — N184 Chronic kidney disease, stage 4 (severe): Secondary | ICD-10-CM | POA: Diagnosis present

## 2013-10-19 DIAGNOSIS — W19XXXA Unspecified fall, initial encounter: Secondary | ICD-10-CM | POA: Diagnosis present

## 2013-10-19 DIAGNOSIS — R3129 Other microscopic hematuria: Secondary | ICD-10-CM

## 2013-10-19 DIAGNOSIS — N289 Disorder of kidney and ureter, unspecified: Secondary | ICD-10-CM

## 2013-10-19 DIAGNOSIS — I739 Peripheral vascular disease, unspecified: Secondary | ICD-10-CM | POA: Diagnosis not present

## 2013-10-19 DIAGNOSIS — E1100 Type 2 diabetes mellitus with hyperosmolarity without nonketotic hyperglycemic-hyperosmolar coma (NKHHC): Secondary | ICD-10-CM

## 2013-10-19 DIAGNOSIS — E162 Hypoglycemia, unspecified: Secondary | ICD-10-CM

## 2013-10-19 DIAGNOSIS — Z23 Encounter for immunization: Secondary | ICD-10-CM

## 2013-10-19 DIAGNOSIS — T827XXD Infection and inflammatory reaction due to other cardiac and vascular devices, implants and grafts, subsequent encounter: Secondary | ICD-10-CM

## 2013-10-19 DIAGNOSIS — E785 Hyperlipidemia, unspecified: Secondary | ICD-10-CM

## 2013-10-19 DIAGNOSIS — Z889 Allergy status to unspecified drugs, medicaments and biological substances status: Secondary | ICD-10-CM | POA: Diagnosis not present

## 2013-10-19 DIAGNOSIS — Z9119 Patient's noncompliance with other medical treatment and regimen: Secondary | ICD-10-CM

## 2013-10-19 DIAGNOSIS — E860 Dehydration: Secondary | ICD-10-CM | POA: Diagnosis present

## 2013-10-19 DIAGNOSIS — R627 Adult failure to thrive: Secondary | ICD-10-CM

## 2013-10-19 DIAGNOSIS — R509 Fever, unspecified: Secondary | ICD-10-CM

## 2013-10-19 DIAGNOSIS — R0609 Other forms of dyspnea: Secondary | ICD-10-CM

## 2013-10-19 DIAGNOSIS — Z792 Long term (current) use of antibiotics: Secondary | ICD-10-CM

## 2013-10-19 DIAGNOSIS — I70269 Atherosclerosis of native arteries of extremities with gangrene, unspecified extremity: Secondary | ICD-10-CM

## 2013-10-19 DIAGNOSIS — I339 Acute and subacute endocarditis, unspecified: Secondary | ICD-10-CM | POA: Diagnosis not present

## 2013-10-19 HISTORY — DX: Anemia, unspecified: D64.9

## 2013-10-19 LAB — CBC WITH DIFFERENTIAL/PLATELET
Basophils Absolute: 0 10*3/uL (ref 0.0–0.1)
Basophils Relative: 0 % (ref 0–1)
EOS ABS: 0 10*3/uL (ref 0.0–0.7)
Eosinophils Relative: 0 % (ref 0–5)
HCT: 34.5 % — ABNORMAL LOW (ref 39.0–52.0)
HEMOGLOBIN: 10.9 g/dL — AB (ref 13.0–17.0)
LYMPHS ABS: 1.3 10*3/uL (ref 0.7–4.0)
LYMPHS PCT: 11 % — AB (ref 12–46)
MCH: 24.5 pg — AB (ref 26.0–34.0)
MCHC: 31.6 g/dL (ref 30.0–36.0)
MCV: 77.5 fL — ABNORMAL LOW (ref 78.0–100.0)
MONOS PCT: 7 % (ref 3–12)
Monocytes Absolute: 0.8 10*3/uL (ref 0.1–1.0)
NEUTROS PCT: 82 % — AB (ref 43–77)
Neutro Abs: 9.8 10*3/uL — ABNORMAL HIGH (ref 1.7–7.7)
Platelets: 250 10*3/uL (ref 150–400)
RBC: 4.45 MIL/uL (ref 4.22–5.81)
RDW: 16.7 % — ABNORMAL HIGH (ref 11.5–15.5)
WBC: 12 10*3/uL — ABNORMAL HIGH (ref 4.0–10.5)

## 2013-10-19 LAB — GLUCOSE, CAPILLARY: GLUCOSE-CAPILLARY: 252 mg/dL — AB (ref 70–99)

## 2013-10-19 LAB — URINE MICROSCOPIC-ADD ON

## 2013-10-19 LAB — COMPREHENSIVE METABOLIC PANEL
ALT: 18 U/L (ref 0–53)
ANION GAP: 16 — AB (ref 5–15)
AST: 24 U/L (ref 0–37)
Albumin: 2.9 g/dL — ABNORMAL LOW (ref 3.5–5.2)
Alkaline Phosphatase: 84 U/L (ref 39–117)
BILIRUBIN TOTAL: 0.7 mg/dL (ref 0.3–1.2)
BUN: 83 mg/dL — AB (ref 6–23)
CO2: 21 meq/L (ref 19–32)
Calcium: 10.1 mg/dL (ref 8.4–10.5)
Chloride: 98 mEq/L (ref 96–112)
Creatinine, Ser: 2.75 mg/dL — ABNORMAL HIGH (ref 0.50–1.35)
GFR calc Af Amer: 23 mL/min — ABNORMAL LOW (ref >90)
GFR, EST NON AFRICAN AMERICAN: 20 mL/min — AB (ref >90)
GLUCOSE: 214 mg/dL — AB (ref 70–99)
Potassium: 5.2 mEq/L (ref 3.7–5.3)
Sodium: 135 mEq/L — ABNORMAL LOW (ref 137–147)
Total Protein: 9.4 g/dL — ABNORMAL HIGH (ref 6.0–8.3)

## 2013-10-19 LAB — CBC
HCT: 28.6 % — ABNORMAL LOW (ref 39.0–52.0)
HEMOGLOBIN: 9.1 g/dL — AB (ref 13.0–17.0)
MCH: 24.3 pg — AB (ref 26.0–34.0)
MCHC: 31.8 g/dL (ref 30.0–36.0)
MCV: 76.3 fL — ABNORMAL LOW (ref 78.0–100.0)
Platelets: 205 10*3/uL (ref 150–400)
RBC: 3.75 MIL/uL — ABNORMAL LOW (ref 4.22–5.81)
RDW: 16.5 % — ABNORMAL HIGH (ref 11.5–15.5)
WBC: 17.6 10*3/uL — ABNORMAL HIGH (ref 4.0–10.5)

## 2013-10-19 LAB — URINALYSIS, ROUTINE W REFLEX MICROSCOPIC
BILIRUBIN URINE: NEGATIVE
Glucose, UA: NEGATIVE mg/dL
KETONES UR: NEGATIVE mg/dL
Leukocytes, UA: NEGATIVE
NITRITE: NEGATIVE
Protein, ur: 30 mg/dL — AB
Specific Gravity, Urine: 1.017 (ref 1.005–1.030)
UROBILINOGEN UA: 1 mg/dL (ref 0.0–1.0)
pH: 5 (ref 5.0–8.0)

## 2013-10-19 LAB — I-STAT CG4 LACTIC ACID, ED: Lactic Acid, Venous: 1.98 mmol/L (ref 0.5–2.2)

## 2013-10-19 LAB — CREATININE, SERUM
CREATININE: 2.51 mg/dL — AB (ref 0.50–1.35)
GFR calc non Af Amer: 22 mL/min — ABNORMAL LOW (ref >90)
GFR, EST AFRICAN AMERICAN: 26 mL/min — AB (ref >90)

## 2013-10-19 LAB — TROPONIN I
TROPONIN I: 0.39 ng/mL — AB (ref <0.30)
Troponin I: <0.3 ng/mL (ref <0.30)

## 2013-10-19 LAB — PRO B NATRIURETIC PEPTIDE: Pro B Natriuretic peptide (BNP): 2214 pg/mL — ABNORMAL HIGH (ref 0–450)

## 2013-10-19 LAB — TSH: TSH: 1.5 u[IU]/mL (ref 0.350–4.500)

## 2013-10-19 MED ORDER — ACETAMINOPHEN 650 MG RE SUPP: 650.0000 mg | Freq: Four times a day (QID) | RECTAL | Status: DC | PRN

## 2013-10-19 MED ORDER — PANTOPRAZOLE SODIUM 40 MG PO TBEC
40.0000 mg | DELAYED_RELEASE_TABLET | Freq: Every day | ORAL | Status: DC
  Administered 2013-10-19 – 2013-10-31 (×13): 40 mg via ORAL
  Filled 2013-10-19 (×13): qty 1

## 2013-10-19 MED ORDER — LINAGLIPTIN 5 MG PO TABS
5.0000 mg | ORAL_TABLET | Freq: Every day | ORAL | Status: DC
  Administered 2013-10-20 – 2013-10-31 (×12): 5 mg via ORAL
  Filled 2013-10-19 (×12): qty 1

## 2013-10-19 MED ORDER — DEXTROSE 5 % IV SOLN
1.0000 g | INTRAVENOUS | Status: DC
  Administered 2013-10-20 – 2013-10-22 (×3): 1 g via INTRAVENOUS
  Filled 2013-10-19 (×3): qty 10

## 2013-10-19 MED ORDER — AZITHROMYCIN 500 MG IV SOLR
500.0000 mg | INTRAVENOUS | Status: DC
  Administered 2013-10-20: 500 mg via INTRAVENOUS
  Filled 2013-10-19 (×2): qty 500

## 2013-10-19 MED ORDER — ACETAMINOPHEN 325 MG PO TABS: 650.0000 mg | ORAL_TABLET | Freq: Four times a day (QID) | ORAL | Status: DC | PRN

## 2013-10-19 MED ORDER — ISOSORBIDE MONONITRATE ER 30 MG PO TB24
30.0000 mg | ORAL_TABLET | Freq: Every day | ORAL | Status: DC
  Administered 2013-10-19 – 2013-10-26 (×7): 30 mg via ORAL
  Filled 2013-10-19 (×9): qty 1

## 2013-10-19 MED ORDER — SODIUM CHLORIDE 0.9 % IV SOLN: 1000.0000 mL | INTRAVENOUS | Status: DC

## 2013-10-19 MED ORDER — SODIUM CHLORIDE 0.9 % IV SOLN
INTRAVENOUS | Status: DC
  Administered 2013-10-19 – 2013-10-21 (×4): via INTRAVENOUS

## 2013-10-19 MED ORDER — SODIUM CHLORIDE 0.9 % IJ SOLN
3.0000 mL | Freq: Two times a day (BID) | INTRAMUSCULAR | Status: DC
  Administered 2013-10-19 – 2013-10-30 (×14): 3 mL via INTRAVENOUS

## 2013-10-19 MED ORDER — METOPROLOL TARTRATE 12.5 MG HALF TABLET
12.5000 mg | ORAL_TABLET | Freq: Two times a day (BID) | ORAL | Status: DC
  Administered 2013-10-19: 12.5 mg via ORAL
  Filled 2013-10-19 (×2): qty 1

## 2013-10-19 MED ORDER — DEXTROSE 5 % IV SOLN
2.0000 g | Freq: Once | INTRAVENOUS | Status: AC
  Administered 2013-10-19: 2 g via INTRAVENOUS
  Filled 2013-10-19: qty 2

## 2013-10-19 MED ORDER — INSULIN ASPART 100 UNIT/ML ~~LOC~~ SOLN
0.0000 [IU] | Freq: Three times a day (TID) | SUBCUTANEOUS | Status: DC
Start: 2013-10-19 — End: 2013-10-20
  Administered 2013-10-19: 5 [IU] via SUBCUTANEOUS
  Administered 2013-10-20: 2 [IU] via SUBCUTANEOUS
  Administered 2013-10-20: 3 [IU] via SUBCUTANEOUS

## 2013-10-19 MED ORDER — SODIUM CHLORIDE 0.9 % IV BOLUS (SEPSIS): 2000.0000 mL | Freq: Once | INTRAVENOUS | Status: DC

## 2013-10-19 MED ORDER — ENOXAPARIN SODIUM 30 MG/0.3ML ~~LOC~~ SOLN
30.0000 mg | SUBCUTANEOUS | Status: DC
  Administered 2013-10-19 – 2013-10-24 (×6): 30 mg via SUBCUTANEOUS
  Filled 2013-10-19 (×6): qty 0.3

## 2013-10-19 MED ORDER — VITAMIN B-12 1000 MCG PO TABS
1000.0000 ug | ORAL_TABLET | Freq: Every day | ORAL | Status: DC
  Administered 2013-10-19 – 2013-10-31 (×13): 1000 ug via ORAL
  Filled 2013-10-19 (×14): qty 1

## 2013-10-19 MED ORDER — DEXTROSE 5 % IV SOLN: 1.0000 g | INTRAVENOUS | Status: DC

## 2013-10-19 MED ORDER — ACETAMINOPHEN 325 MG PO TABS
650.0000 mg | ORAL_TABLET | Freq: Once | ORAL | Status: AC
  Administered 2013-10-19: 650 mg via ORAL
  Filled 2013-10-19: qty 2

## 2013-10-19 MED ORDER — ASPIRIN EC 81 MG PO TBEC: 81.0000 mg | DELAYED_RELEASE_TABLET | Freq: Every day | ORAL | Status: DC

## 2013-10-19 MED ORDER — LEVALBUTEROL HCL 0.63 MG/3ML IN NEBU
0.6300 mg | INHALATION_SOLUTION | Freq: Four times a day (QID) | RESPIRATORY_TRACT | Status: DC | PRN
  Filled 2013-10-19: qty 3

## 2013-10-19 MED ORDER — ASPIRIN EC 81 MG PO TBEC
81.0000 mg | DELAYED_RELEASE_TABLET | Freq: Every day | ORAL | Status: DC
  Administered 2013-10-19 – 2013-10-31 (×13): 81 mg via ORAL
  Filled 2013-10-19 (×14): qty 1

## 2013-10-19 MED ORDER — DEXTROSE 5 % IV SOLN
500.0000 mg | INTRAVENOUS | Status: DC
  Administered 2013-10-19: 500 mg via INTRAVENOUS
  Filled 2013-10-19: qty 500

## 2013-10-19 MED ORDER — SODIUM CHLORIDE 0.9 % IV SOLN
Freq: Once | INTRAVENOUS | Status: AC
  Administered 2013-10-19: 07:00:00 via INTRAVENOUS

## 2013-10-19 MED ORDER — DOCUSATE SODIUM 100 MG PO CAPS
100.0000 mg | ORAL_CAPSULE | Freq: Two times a day (BID) | ORAL | Status: DC
  Administered 2013-10-19 – 2013-10-31 (×24): 100 mg via ORAL
  Filled 2013-10-19 (×26): qty 1

## 2013-10-19 MED ORDER — EZETIMIBE 10 MG PO TABS
10.0000 mg | ORAL_TABLET | Freq: Every day | ORAL | Status: DC
  Administered 2013-10-19 – 2013-10-31 (×13): 10 mg via ORAL
  Filled 2013-10-19 (×13): qty 1

## 2013-10-19 MED ORDER — INSULIN GLARGINE 100 UNIT/ML ~~LOC~~ SOLN
20.0000 [IU] | Freq: Every day | SUBCUTANEOUS | Status: DC
  Administered 2013-10-19: 20 [IU] via SUBCUTANEOUS
  Filled 2013-10-19 (×2): qty 0.2

## 2013-10-19 MED ORDER — ONDANSETRON HCL 4 MG PO TABS: 4.0000 mg | ORAL_TABLET | Freq: Four times a day (QID) | ORAL | Status: DC | PRN

## 2013-10-19 MED ORDER — ONDANSETRON HCL 4 MG/2ML IJ SOLN: 4.0000 mg | Freq: Four times a day (QID) | INTRAMUSCULAR | Status: DC | PRN

## 2013-10-19 MED ORDER — CARVEDILOL 6.25 MG PO TABS
6.2500 mg | ORAL_TABLET | Freq: Two times a day (BID) | ORAL | Status: DC
  Administered 2013-10-20 – 2013-10-31 (×21): 6.25 mg via ORAL
  Filled 2013-10-19 (×28): qty 1

## 2013-10-19 NOTE — ED Notes (Signed)
Per [facility] pt from home where he lives with his wife, pt reports fell approx 0500 unable to get up, his wife found him and called 911, pt c/o posterior neck pain x1 week, pt also stated to EMS back of his head feels hard and unable to think and remember things. Family reported to EMS recent weight loss. VSS BP 140/60, HR 88, RR 18, CBG 247

## 2013-10-19 NOTE — ED Notes (Signed)
Attempted report 

## 2013-10-19 NOTE — ED Notes (Signed)
Ernst Bowler, pt's daughter contact number [phone], pt's spouse Bronner Macgill [phone]

## 2013-10-19 NOTE — ED Provider Notes (Signed)
CSN: [id number]     Arrival date & time 10/19/13  [id number] History   First MD Initiated Contact with Patient 10/19/13 [phone]     Chief Complaint  Patient presents with  . Fall  . Fever     (Consider location/radiation/quality/duration/timing/severity/associated sxs/prior Treatment) Patient is a 80 y.o. male presenting with fever and fall. The history is provided by the patient. No language interpreter was used.  Fever Max temp prior to arrival:  101 Temp source:  Oral Severity:  Moderate Onset quality:  Gradual Duration:  1 day Timing:  Constant Progression:  Worsening Chronicity:  New Relieved by:  Nothing Worsened by:  Nothing tried Ineffective treatments:  None tried Associated symptoms: myalgias and nausea   Risk factors: no sick contacts   Fall This is a new problem. The current episode started 1 to 4 weeks ago. The problem occurs every several days. The problem has been gradually worsening. Associated symptoms include a fever, myalgias, nausea and neck pain. Nothing aggravates the symptoms. He has tried nothing for the symptoms. The treatment provided moderate relief.  Pt has been recently having increased difficulty ambulating and having frequent falls.   Family reports pt has hit his head multiple times.  Pt complains of neck soreness today.  Family unsure if pt hit his head today but he has hit his head recently during previous falls.  Pt's MD has arranged for pt to have a head ct due to fall.  Today pt has had a fever and increased weakness.    Past Medical History  Diagnosis Date  . Coronary artery disease     Status post CABG in 2004  . Peripheral vascular disease     Status post right femoropopliteal bypass  . Dyslipidemia   . Diabetes mellitus with peripheral artery disease   . Hypertension   . Chronic renal insufficiency     creatinine around 2.4  . CHF (congestive heart failure) 2007    ef 38%  . History of CVA (cerebrovascular accident)   . Cardiomyopathy   .  COPD (chronic obstructive pulmonary disease)   . History of anemia   . Myocardial infarction   . Shortness of breath   . Pacemaker   . Pneumonia   . Anginal pain   . Dementia   . GERD (gastroesophageal reflux disease)   . Arthritis    Past Surgical History  Procedure Laterality Date  . Insert / replace / remove pacemaker  05/17/2005    [employer] dual chamber ddd mode   . Coronary artery bypass graft  4 of 2004    4 vessel bypass  . Femoral bypass  july 2006    right with right great toe amputation  . Cardiac catheterization  04/29/2002    Dr. Glade Lloyd  . Esophagogastroduodenoscopy  07/21/2011    Procedure: ESOPHAGOGASTRODUODENOSCOPY (EGD);  Surgeon: Beryle Beams, MD;  Location: [facility];  Service: Endoscopy;  Laterality: N/A;  . Coronary angioplasty    . Femoral-popliteal bypass graft Left 05/01/2013    Procedure: LEFT FEMORAL TO ABOVE KNEE POPLITEAL ARTERY BYPASS GRAFT WITH INTRAOPERATIVE ARTEROGRAM;  Surgeon: Mal Misty, MD;  Location: [place];  Service: Vascular;  Laterality: Left;  . Amputation Left 05/05/2013    Procedure: AMPUTATION RAY- FIRST AND SECOND-LEFT FOOT;  Surgeon: Newt Minion, MD;  Location: [place];  Service: Orthopedics;  Laterality: Left;   Family History  Problem Relation Age of Onset  . Hyperlipidemia Mother   . Heart disease Father   .  Diabetes    . Diabetes Sister   . Hypertension Sister   . Hyperlipidemia Sister    History  Substance Use Topics  . Smoking status: Former Smoker -- 1.00 packs/day for 50 years    Types: Cigarettes    Quit date: 06/09/2002  . Smokeless tobacco: Never Used  . Alcohol Use: No    Review of Systems  Constitutional: Positive for fever.  Gastrointestinal: Positive for nausea.  Musculoskeletal: Positive for myalgias and neck pain.  All other systems reviewed and are negative.     Allergies  Lisinopril  Home Medications   Prior to Admission medications   Medication Sig Start Date End Date Taking? Authorizing  Provider  aspirin EC 81 MG EC tablet Take 4 tablets (325 mg total) by mouth daily. 07/02/12   Janece Canterbury, MD  B Complex-C (B-COMPLEX WITH VITAMIN C) tablet Take 1 tablet by mouth daily.    Historical Provider, MD  carvedilol (COREG) 6.25 MG tablet TAKE 1 TABLET BY MOUTH TWICE A DAY WITH A MEAL 09/26/13   Thayer Headings, MD  ezetimibe (ZETIA) 10 MG tablet Take 10 mg by mouth daily.    Historical Provider, MD  furosemide (LASIX) 40 MG tablet Take 40 mg by mouth daily.    Historical Provider, MD  furosemide (LASIX) 40 MG tablet TAKE 1 TABLET BY MOUTH EVERY DAY 09/26/13   Thayer Headings, MD  glucose 4 GM chewable tablet Chew 4 tablets (16 g total) by mouth as needed for low blood sugar. 07/02/12   Janece Canterbury, MD  hydrALAZINE (APRESOLINE) 10 MG tablet Take 1 tablet (10 mg total) by mouth 3 (three) times daily. 8 HOURS APART LIKE 6 AM 2 PM 10 PM 01/02/13   Thayer Headings, MD  insulin aspart (NOVOLOG) 100 UNIT/ML injection Inject 5 Units into the skin 3 (three) times daily before meals. For CBG > 150    Historical Provider, MD  insulin glargine (LANTUS) 100 UNIT/ML injection Inject 5 Units into the skin at bedtime.    Historical Provider, MD  ipratropium-albuterol (DUONEB) 0.5-2.5 (3) MG/3ML SOLN Take 3 mLs by nebulization every 6 (six) hours as needed.    Historical Provider, MD  isosorbide mononitrate (IMDUR) 30 MG 24 hr tablet TAKE 1 TABLET (30 MG TOTAL) BY MOUTH DAILY. 09/26/13   Thayer Headings, MD  linagliptin (TRADJENTA) 5 MG TABS tablet Take 5 mg by mouth daily.    Historical Provider, MD  Omega-3 Fatty Acids (FISH OIL) 1000 MG CAPS Take 1,000 mg by mouth daily.     Historical Provider, MD  omeprazole (PRILOSEC) 40 MG capsule Take 40 mg by mouth daily.    Historical Provider, MD  oxyCODONE (OXY IR/ROXICODONE) 5 MG immediate release tablet Take 1 tablet (5 mg total) by mouth every 6 (six) hours as needed for moderate pain. 05/08/13   Samantha J Rhyne, PA-C  sodium polystyrene (KAYEXALATE) 15  GM/60ML suspension Take 15 g by mouth 3 (three) times a week.    Historical Provider, MD   BP 163/58  Pulse 85  Temp(Src) 100.6 F (38.1 C) (Oral)  Resp 20  SpO2 95% Physical Exam  Nursing note and vitals reviewed. Constitutional: He is oriented to person, place, and time. He appears well-developed and well-nourished.  HENT:  Head: Normocephalic and atraumatic.  Right Ear: External ear normal.  Left Ear: External ear normal.  Mouth/Throat: Oropharynx is clear and moist.  Eyes: EOM are normal. Pupils are equal, round, and reactive to light.  Neck:  Normal range of motion.  Cardiovascular: Normal rate and regular rhythm.   Pulmonary/Chest: Effort normal.  Abdominal: Soft. He exhibits no distension.  Musculoskeletal: Normal range of motion.  Neurological: He is alert and oriented to person, place, and time.  Skin: Skin is warm.  Psychiatric: He has a normal mood and affect.    ED Course  Procedures (including critical care time) Labs Review Labs Reviewed  CBC WITH DIFFERENTIAL  COMPREHENSIVE METABOLIC PANEL  URINALYSIS, ROUTINE W REFLEX MICROSCOPIC  I-STAT CG4 LACTIC ACID, ED    Imaging Review No results found.   EKG Interpretation   Date/Time:  Sunday October 19 2013 06:47:49 EDT Ventricular Rate:  99 PR Interval:  138 QRS Duration: 107 QT Interval:  327 QTC Calculation: 420 R Axis:   -36 Text Interpretation:  Sinus tachycardia Multiple premature complexes, vent   Incomplete left bundle branch block LVH with secondary repolarization  abnormality Anterior ST elevation, probably due to LVH Confirmed by OTTER   MD, OLGA ([id number]) on 10/19/2013 6:57:55 AM     Results for orders placed during the hospital encounter of 10/19/13  CBC WITH DIFFERENTIAL      Result Value Ref Range   WBC 12.0 (*) 4.0 - 10.5 K/uL   RBC 4.45  4.22 - 5.81 MIL/uL   Hemoglobin 10.9 (*) 13.0 - 17.0 g/dL   HCT 34.5 (*) 39.0 - 52.0 %   MCV 77.5 (*) 78.0 - 100.0 fL   MCH 24.5 (*) 26.0 - 34.0  pg   MCHC 31.6  30.0 - 36.0 g/dL   RDW 16.7 (*) 11.5 - 15.5 %   Platelets 250  150 - 400 K/uL   Neutrophils Relative % 82 (*) 43 - 77 %   Neutro Abs 9.8 (*) 1.7 - 7.7 K/uL   Lymphocytes Relative 11 (*) 12 - 46 %   Lymphs Abs 1.3  0.7 - 4.0 K/uL   Monocytes Relative 7  3 - 12 %   Monocytes Absolute 0.8  0.1 - 1.0 K/uL   Eosinophils Relative 0  0 - 5 %   Eosinophils Absolute 0.0  0.0 - 0.7 K/uL   Basophils Relative 0  0 - 1 %   Basophils Absolute 0.0  0.0 - 0.1 K/uL  COMPREHENSIVE METABOLIC PANEL      Result Value Ref Range   Sodium 135 (*) 137 - 147 mEq/L   Potassium 5.2  3.7 - 5.3 mEq/L   Chloride 98  96 - 112 mEq/L   CO2 21  19 - 32 mEq/L   Glucose, Bld 214 (*) 70 - 99 mg/dL   BUN 83 (*) 6 - 23 mg/dL   Creatinine, Ser 2.75 (*) 0.50 - 1.35 mg/dL   Calcium 10.1  8.4 - 10.5 mg/dL   Total Protein 9.4 (*) 6.0 - 8.3 g/dL   Albumin 2.9 (*) 3.5 - 5.2 g/dL   AST 24  0 - 37 U/L   ALT 18  0 - 53 U/L   Alkaline Phosphatase 84  39 - 117 U/L   Total Bilirubin 0.7  0.3 - 1.2 mg/dL   GFR calc non Af Amer 20 (*) >90 mL/min   GFR calc Af Amer 23 (*) >90 mL/min   Anion gap 16 (*) 5 - 15  URINALYSIS, ROUTINE W REFLEX MICROSCOPIC      Result Value Ref Range   Color, Urine YELLOW  YELLOW   APPearance CLOUDY (*) CLEAR   Specific Gravity, Urine 1.017  1.005 - 1.030  pH 5.0  5.0 - 8.0   Glucose, UA NEGATIVE  NEGATIVE mg/dL   Hgb urine dipstick SMALL (*) NEGATIVE   Bilirubin Urine NEGATIVE  NEGATIVE   Ketones, ur NEGATIVE  NEGATIVE mg/dL   Protein, ur 30 (*) NEGATIVE mg/dL   Urobilinogen, UA 1.0  0.0 - 1.0 mg/dL   Nitrite NEGATIVE  NEGATIVE   Leukocytes, UA NEGATIVE  NEGATIVE  URINE MICROSCOPIC-ADD ON      Result Value Ref Range   Squamous Epithelial / LPF RARE  RARE   WBC, UA 0-2  <3 WBC/hpf   RBC / HPF 0-2  <3 RBC/hpf   Bacteria, UA FEW (*) RARE  I-STAT CG4 LACTIC ACID, ED      Result Value Ref Range   Lactic Acid, Venous 1.98  0.5 - 2.2 mmol/L   Dg Chest 2 View  10/19/2013    CLINICAL DATA:  Fever, confusion and fall.  EXAM: CHEST  2 VIEW  COMPARISON:  04/30/2013  FINDINGS: Stable appearance of the right dual lead cardiac pacemaker. Heart and mediastinum are stable. Post CABG changes in the chest. There is mild elevation of the posterior hemidiaphragms bilaterally and this is similar to the prior examination. No evidence for airspace disease or pleural effusions.  IMPRESSION: No acute cardiopulmonary disease.   Electronically Signed   By: Markus Daft M.D.   On: 10/19/2013 07:59   Ct Head Wo Contrast  10/19/2013   CLINICAL DATA:  Fall and complains of posterior neck pain.  EXAM: CT HEAD WITHOUT CONTRAST  CT CERVICAL SPINE WITHOUT CONTRAST  TECHNIQUE: Multidetector CT imaging of the head and cervical spine was performed following the standard protocol without intravenous contrast. Multiplanar CT image reconstructions of the cervical spine were also generated.  COMPARISON:  Head CT 07/01/2012  FINDINGS: CT HEAD FINDINGS  There is cerebral atrophy which has minimally changed. There is extensive low-density in the periventricular and subcortical white matter suggesting chronic changes. No evidence for acute hemorrhage, mass lesion, midline shift, hydrocephalus or large infarct. Again noted is an mixed sclerotic and lucent process involving the greater wing of the left sphenoid. This appears to obliterate the left sphenoid sinus and this has not significantly changed. Findings suggest a chronic benign process such as Paget's disease.  CT CERVICAL SPINE FINDINGS  Lung apices are clear. Negative for a fracture or dislocation. Multilevel degenerative disease in the cervical spine with disc space narrowing from C3-C7. Normal alignment at the cervical-thoracic junction. Multiple levels of the uncovertebral spurring.  IMPRESSION: No acute intracranial abnormality.  No acute bone abnormality in the cervical spine.  Atrophy and evidence for chronic small vessel ischemic changes.  Multilevel  degenerative disease in the cervical spine.   Electronically Signed   By: Markus Daft M.D.   On: 10/19/2013 08:12   Ct Cervical Spine Wo Contrast  10/19/2013   CLINICAL DATA:  Fall and complains of posterior neck pain.  EXAM: CT HEAD WITHOUT CONTRAST  CT CERVICAL SPINE WITHOUT CONTRAST  TECHNIQUE: Multidetector CT imaging of the head and cervical spine was performed following the standard protocol without intravenous contrast. Multiplanar CT image reconstructions of the cervical spine were also generated.  COMPARISON:  Head CT 07/01/2012  FINDINGS: CT HEAD FINDINGS  There is cerebral atrophy which has minimally changed. There is extensive low-density in the periventricular and subcortical white matter suggesting chronic changes. No evidence for acute hemorrhage, mass lesion, midline shift, hydrocephalus or large infarct. Again noted is an mixed sclerotic and lucent process involving  the greater wing of the left sphenoid. This appears to obliterate the left sphenoid sinus and this has not significantly changed. Findings suggest a chronic benign process such as Paget's disease.  CT CERVICAL SPINE FINDINGS  Lung apices are clear. Negative for a fracture or dislocation. Multilevel degenerative disease in the cervical spine with disc space narrowing from C3-C7. Normal alignment at the cervical-thoracic junction. Multiple levels of the uncovertebral spurring.  IMPRESSION: No acute intracranial abnormality.  No acute bone abnormality in the cervical spine.  Atrophy and evidence for chronic small vessel ischemic changes.  Multilevel degenerative disease in the cervical spine.   Electronically Signed   By: Markus Daft M.D.   On: 10/19/2013 08:12     Results for orders placed during the hospital encounter of 10/19/13  CBC WITH DIFFERENTIAL      Result Value Ref Range   WBC 12.0 (*) 4.0 - 10.5 K/uL   RBC 4.45  4.22 - 5.81 MIL/uL   Hemoglobin 10.9 (*) 13.0 - 17.0 g/dL   HCT 34.5 (*) 39.0 - 52.0 %   MCV 77.5 (*) 78.0  - 100.0 fL   MCH 24.5 (*) 26.0 - 34.0 pg   MCHC 31.6  30.0 - 36.0 g/dL   RDW 16.7 (*) 11.5 - 15.5 %   Platelets 250  150 - 400 K/uL   Neutrophils Relative % 82 (*) 43 - 77 %   Neutro Abs 9.8 (*) 1.7 - 7.7 K/uL   Lymphocytes Relative 11 (*) 12 - 46 %   Lymphs Abs 1.3  0.7 - 4.0 K/uL   Monocytes Relative 7  3 - 12 %   Monocytes Absolute 0.8  0.1 - 1.0 K/uL   Eosinophils Relative 0  0 - 5 %   Eosinophils Absolute 0.0  0.0 - 0.7 K/uL   Basophils Relative 0  0 - 1 %   Basophils Absolute 0.0  0.0 - 0.1 K/uL  COMPREHENSIVE METABOLIC PANEL      Result Value Ref Range   Sodium 135 (*) 137 - 147 mEq/L   Potassium 5.2  3.7 - 5.3 mEq/L   Chloride 98  96 - 112 mEq/L   CO2 21  19 - 32 mEq/L   Glucose, Bld 214 (*) 70 - 99 mg/dL   BUN 83 (*) 6 - 23 mg/dL   Creatinine, Ser 2.75 (*) 0.50 - 1.35 mg/dL   Calcium 10.1  8.4 - 10.5 mg/dL   Total Protein 9.4 (*) 6.0 - 8.3 g/dL   Albumin 2.9 (*) 3.5 - 5.2 g/dL   AST 24  0 - 37 U/L   ALT 18  0 - 53 U/L   Alkaline Phosphatase 84  39 - 117 U/L   Total Bilirubin 0.7  0.3 - 1.2 mg/dL   GFR calc non Af Amer 20 (*) >90 mL/min   GFR calc Af Amer 23 (*) >90 mL/min   Anion gap 16 (*) 5 - 15  URINALYSIS, ROUTINE W REFLEX MICROSCOPIC      Result Value Ref Range   Color, Urine YELLOW  YELLOW   APPearance CLOUDY (*) CLEAR   Specific Gravity, Urine 1.017  1.005 - 1.030   pH 5.0  5.0 - 8.0   Glucose, UA NEGATIVE  NEGATIVE mg/dL   Hgb urine dipstick SMALL (*) NEGATIVE   Bilirubin Urine NEGATIVE  NEGATIVE   Ketones, ur NEGATIVE  NEGATIVE mg/dL   Protein, ur 30 (*) NEGATIVE mg/dL   Urobilinogen, UA 1.0  0.0 - 1.0 mg/dL  Nitrite NEGATIVE  NEGATIVE   Leukocytes, UA NEGATIVE  NEGATIVE  URINE MICROSCOPIC-ADD ON      Result Value Ref Range   Squamous Epithelial / LPF RARE  RARE   WBC, UA 0-2  <3 WBC/hpf   RBC / HPF 0-2  <3 RBC/hpf   Bacteria, UA FEW (*) RARE  I-STAT CG4 LACTIC ACID, ED      Result Value Ref Range   Lactic Acid, Venous 1.98  0.5 - 2.2 mmol/L    Dg Chest 2 View  10/19/2013   CLINICAL DATA:  Fever, confusion and fall.  EXAM: CHEST  2 VIEW  COMPARISON:  04/30/2013  FINDINGS: Stable appearance of the right dual lead cardiac pacemaker. Heart and mediastinum are stable. Post CABG changes in the chest. There is mild elevation of the posterior hemidiaphragms bilaterally and this is similar to the prior examination. No evidence for airspace disease or pleural effusions.  IMPRESSION: No acute cardiopulmonary disease.   Electronically Signed   By: Markus Daft M.D.   On: 10/19/2013 07:59   Ct Head Wo Contrast  10/19/2013   CLINICAL DATA:  Fall and complains of posterior neck pain.  EXAM: CT HEAD WITHOUT CONTRAST  CT CERVICAL SPINE WITHOUT CONTRAST  TECHNIQUE: Multidetector CT imaging of the head and cervical spine was performed following the standard protocol without intravenous contrast. Multiplanar CT image reconstructions of the cervical spine were also generated.  COMPARISON:  Head CT 07/01/2012  FINDINGS: CT HEAD FINDINGS  There is cerebral atrophy which has minimally changed. There is extensive low-density in the periventricular and subcortical white matter suggesting chronic changes. No evidence for acute hemorrhage, mass lesion, midline shift, hydrocephalus or large infarct. Again noted is an mixed sclerotic and lucent process involving the greater wing of the left sphenoid. This appears to obliterate the left sphenoid sinus and this has not significantly changed. Findings suggest a chronic benign process such as Paget's disease.  CT CERVICAL SPINE FINDINGS  Lung apices are clear. Negative for a fracture or dislocation. Multilevel degenerative disease in the cervical spine with disc space narrowing from C3-C7. Normal alignment at the cervical-thoracic junction. Multiple levels of the uncovertebral spurring.  IMPRESSION: No acute intracranial abnormality.  No acute bone abnormality in the cervical spine.  Atrophy and evidence for chronic small vessel  ischemic changes.  Multilevel degenerative disease in the cervical spine.   Electronically Signed   By: Markus Daft M.D.   On: 10/19/2013 08:12   Ct Cervical Spine Wo Contrast  10/19/2013   CLINICAL DATA:  Fall and complains of posterior neck pain.  EXAM: CT HEAD WITHOUT CONTRAST  CT CERVICAL SPINE WITHOUT CONTRAST  TECHNIQUE: Multidetector CT imaging of the head and cervical spine was performed following the standard protocol without intravenous contrast. Multiplanar CT image reconstructions of the cervical spine were also generated.  COMPARISON:  Head CT 07/01/2012  FINDINGS: CT HEAD FINDINGS  There is cerebral atrophy which has minimally changed. There is extensive low-density in the periventricular and subcortical white matter suggesting chronic changes. No evidence for acute hemorrhage, mass lesion, midline shift, hydrocephalus or large infarct. Again noted is an mixed sclerotic and lucent process involving the greater wing of the left sphenoid. This appears to obliterate the left sphenoid sinus and this has not significantly changed. Findings suggest a chronic benign process such as Paget's disease.  CT CERVICAL SPINE FINDINGS  Lung apices are clear. Negative for a fracture or dislocation. Multilevel degenerative disease in the cervical spine with disc space  narrowing from C3-C7. Normal alignment at the cervical-thoracic junction. Multiple levels of the uncovertebral spurring.  IMPRESSION: No acute intracranial abnormality.  No acute bone abnormality in the cervical spine.  Atrophy and evidence for chronic small vessel ischemic changes.  Multilevel degenerative disease in the cervical spine.   Electronically Signed   By: Markus Daft M.D.   On: 10/19/2013 08:12    MDM   Final diagnoses:  Fever and chills  Weakness  Falls, initial encounter  Acute renal insufficiency    Dr. Betsey Holiday in to see and examine.   Pt given Rocephin. IV.   Labs show elevated WBC's 12.0    Urine shows no evidence of  infection,  Pt given Iv fluids x 2 liters.    I spoke to Dr. Murrell Converse who will admit for further tratment.    Parkman, PA-C 10/19/13 1015

## 2013-10-19 NOTE — ED Provider Notes (Signed)
Medical screening examination/treatment/procedure(s) were conducted as a shared visit with non-physician practitioner(s) and myself.  I personally evaluated the patient during the encounter.  Please see separate associated note for evaluation and plan.    EKG Interpretation   Date/Time:  Sunday October 19 2013 06:47:49 EDT Ventricular Rate:  99 PR Interval:  138 QRS Duration: 107 QT Interval:  327 QTC Calculation: 420 R Axis:   -36 Text Interpretation:  Sinus tachycardia Multiple premature complexes, vent   Incomplete left bundle branch block LVH with secondary repolarization  abnormality Anterior ST elevation, probably due to LVH Confirmed by OTTER   MD, OLGA ([id number]) on 10/19/2013 6:57:55 AM       Orpah Greek, MD 10/19/13 1020

## 2013-10-19 NOTE — ED Notes (Signed)
MD Polino at bedside.

## 2013-10-19 NOTE — Significant Event (Signed)
Rapid Response Event Note  Overview:  Called to see patient as part of Code Sepsis.    Initial Focused Assessment: VSS. IV bolus infusing in right AC. Nurses attempting second IV. VSS, patient alert and conversant.  Interventions: Assisted with IV bolus infusion and other protocol measures.  Event Summary:   at      at          [name]

## 2013-10-19 NOTE — ED Provider Notes (Signed)
Patient presented to the ER with  Weakness, fall. She reportedly has had multiple falls over the last couple of weeks. Patient this morning, hit the back of his head and was unable to get up. He is complaining of headache and neck pain, but the neck pain has been present since a previous fall.  Face to face Exam: HEENT - PERRLA Lungs - CTAB Heart - RRR, no M/R/G Abd - S/NT/ND Neuro - alert, oriented x3  Plan: Patient with fever, chills, unknown source. Experiencing severe weakness and frequent falls, will ask hospitalist to admit.   Orpah Greek, MD 10/19/13 1020

## 2013-10-19 NOTE — Progress Notes (Signed)
Spoke with Dr. Allyson Sabal, Troponin level called from lab= 0.59. Pt.asymptomatic. Meds ordered.

## 2013-10-19 NOTE — H&P (Addendum)
[facility] History and Physical  Dallin Darragh Kenna [id number] DOB: 29-May-1928 DOA: 10/19/2013  Referring physician:  PCP: Horatio Pel, MD   Chief Complaint: Falls HPI:  80 year old male with history of coronary artery disease, peripheral vascular disease, COPD, history of CVA, history of congestive heart failure with EF of 30%, who presents with a fever and falls. Apparently the patient has had intermittent low-grade fever, weakness, poor oral intake, myalgia, headache and neck pain. History was obtained from the patient's family in ED notes. Symptoms have been ongoing for about one to 2 weeks. Because of the above symptoms the patient has gradually had difficulty ambulating. Apparently he has had his head multiple times but has not lost consciousness. Patient was found to be febrile with temperature of 101.5. Clinically dehydrated creatinine increased from baseline, WBC count of 12,000. Hemodynamically stable. UA, chest x-ray negative.   He is a patient of Dr. Donnetta Hutching has a history of a failed right femoropopliteal bypass graft and right transmetatarsal amputation in 2006 who was hospitalized and discharged on 4/16 and taken to the OR due to gangrenous toe which resulted in amputation and fem pop. Subsequently went to [facility] for on-going therapies including PT/OT; Patient was doing well with therapy, discharge to home in May with family and home health therapies.   Review of Systems: negative for the following  Constitutional: Positive for fever.  Gastrointestinal: Positive for nausea.  Musculoskeletal: Positive for myalgias and neck pain.  Complete 14 point review of systems reviewed are negative.      Past Medical History  Diagnosis Date  . Coronary artery disease     Status post CABG in 2004  . Peripheral vascular disease     Status post right femoropopliteal bypass  . Dyslipidemia   . Diabetes mellitus with peripheral artery disease   . Hypertension   .  Chronic renal insufficiency     creatinine around 2.4  . CHF (congestive heart failure) 2007    ef 38%  . History of CVA (cerebrovascular accident)   . Cardiomyopathy   . COPD (chronic obstructive pulmonary disease)   . History of anemia   . Myocardial infarction   . Shortness of breath   . Pacemaker   . Pneumonia   . Anginal pain   . Dementia   . GERD (gastroesophageal reflux disease)   . Arthritis      Past Surgical History  Procedure Laterality Date  . Insert / replace / remove pacemaker  05/17/2005    [employer] jude dual chamber ddd mode   . Coronary artery bypass graft  4 of 2004    4 vessel bypass  . Femoral bypass  july 2006    right with right great toe amputation  . Cardiac catheterization  04/29/2002    Dr. Glade Lloyd  . Esophagogastroduodenoscopy  07/21/2011    Procedure: ESOPHAGOGASTRODUODENOSCOPY (EGD);  Surgeon: Beryle Beams, MD;  Location: [facility];  Service: Endoscopy;  Laterality: N/A;  . Coronary angioplasty    . Femoral-popliteal bypass graft Left 05/01/2013    Procedure: LEFT FEMORAL TO ABOVE KNEE POPLITEAL ARTERY BYPASS GRAFT WITH INTRAOPERATIVE ARTEROGRAM;  Surgeon: Mal Misty, MD;  Location: [place];  Service: Vascular;  Laterality: Left;  . Amputation Left 05/05/2013    Procedure: AMPUTATION RAY- FIRST AND SECOND-LEFT FOOT;  Surgeon: Newt Minion, MD;  Location: [place];  Service: Orthopedics;  Laterality: Left;      Social History:  reports that he quit smoking about 11 years ago. His  smoking use included Cigarettes. He has a 50 pack-year smoking history. He has never used smokeless tobacco. He reports that he does not drink alcohol or use illicit drugs.    Allergies  Allergen Reactions  . Lisinopril Other (See Comments)    REACTION:  Elevated potassium,renal insuf    Family History  Problem Relation Age of Onset  . Hyperlipidemia Mother   . Heart disease Father   . Diabetes    . Diabetes Sister   . Hypertension Sister   . Hyperlipidemia  Sister      Prior to Admission medications   Medication Sig Start Date End Date Taking? Authorizing Provider  aspirin EC 81 MG tablet Take 81 mg by mouth daily.   Yes Historical Provider, MD  B Complex-C (B-COMPLEX WITH VITAMIN C) tablet Take 1 tablet by mouth daily.   Yes Historical Provider, MD  carvedilol (COREG) 6.25 MG tablet TAKE 1 TABLET BY MOUTH TWICE A DAY WITH A MEAL 09/26/13  Yes Thayer Headings, MD  carvedilol (COREG) 6.25 MG tablet Take 6.25 mg by mouth 2 (two) times daily with a meal.   Yes Historical Provider, MD  ezetimibe (ZETIA) 10 MG tablet Take 10 mg by mouth daily.   Yes Historical Provider, MD  furosemide (LASIX) 40 MG tablet Take 40 mg by mouth daily.   Yes Historical Provider, MD  insulin glargine (LANTUS) 100 UNIT/ML injection Inject 20 Units into the skin at bedtime.   Yes Historical Provider, MD  isosorbide mononitrate (IMDUR) 30 MG 24 hr tablet Take 30 mg by mouth daily.   Yes Historical Provider, MD  linagliptin (TRADJENTA) 5 MG TABS tablet Take 5 mg by mouth daily.   Yes Historical Provider, MD  Omega-3 Fatty Acids (FISH OIL) 1000 MG CAPS Take 1,000 mg by mouth daily.    Yes Historical Provider, MD  omeprazole (PRILOSEC) 40 MG capsule Take 40 mg by mouth daily.   Yes Historical Provider, MD  vitamin B-12 (CYANOCOBALAMIN) 1000 MCG tablet Take 1,000 mcg by mouth daily.   Yes Historical Provider, MD  glucose 4 GM chewable tablet Chew 4 tablets (16 g total) by mouth as needed for low blood sugar. 07/02/12   Janece Canterbury, MD     Physical Exam: Filed Vitals:   10/19/13 0815 10/19/13 0845 10/19/13 0909 10/19/13 0915  BP: 155/61 160/59  140/52  Pulse: 95 96  89  Temp:   101.5 F (38.6 C)   TempSrc:   Rectal   Resp: 14 25  29   SpO2: 96% 97%  94%     Constitutional: Vital signs reviewed. Patient is a well-developed and well-nourished in no acute distress and cooperative with exam. Alert and oriented x self and place Head: Normocephalic and atraumatic  Ear: TM  normal bilaterally  Mouth: no erythema or exudates, MMM  Eyes: PERRL, EOMI, conjunctivae normal, No scleral icterus.  Neck: Supple, Trachea midline normal ROM, No JVD, mass, thyromegaly, or carotid bruit present.  Cardiovascular: RRR, S1 normal, S2 normal, no MRG, pulses symmetric and intact bilaterally  Pulmonary/Chest: CTAB, no wheezes, rales, or rhonchi  Abdominal: Soft. Non-tender, non-distended, bowel sounds are normal, no masses, organomegaly, or guarding present.  GU: no CVA tenderness Musculoskeletal: Left metatarsal amputation erythema, or stiffness, ROM full and no nontender Ext: no edema and no cyanosis, pulses palpable bilaterally (DP and PT)  Hematology: no cervical, inginal, or axillary adenopathy.  Neurological: A&O x3, Strenght is normal and symmetric bilaterally, cranial nerve II-XII are grossly intact, no focal motor  deficit, sensory intact to light touch bilaterally.  Skin: Warm, dry and intact. No rash, cyanosis, or clubbing.  Psychiatric: Normal mood and affect. speech and behavior is normal. Judgment and thought content normal. Cognition and memory are normal.       Labs on Admission:    Basic Metabolic Panel:  Recent Labs Lab 10/19/13 0651  NA 135*  K 5.2  CL 98  CO2 21  GLUCOSE 214*  BUN 83*  CREATININE 2.75*  CALCIUM 10.1   Liver Function Tests:  Recent Labs Lab 10/19/13 0651  AST 24  ALT 18  ALKPHOS 84  BILITOT 0.7  PROT 9.4*  ALBUMIN 2.9*   No results found for this basename: LIPASE, AMYLASE,  in the last 168 hours No results found for this basename: AMMONIA,  in the last 168 hours CBC:  Recent Labs Lab 10/19/13 0651  WBC 12.0*  NEUTROABS 9.8*  HGB 10.9*  HCT 34.5*  MCV 77.5*  PLT 250   Cardiac Enzymes: No results found for this basename: CKTOTAL, CKMB, CKMBINDEX, TROPONINI,  in the last 168 hours  BNP (last 3 results) No results found for this basename: PROBNP,  in the last 8760 hours    CBG: No results found for this  basename: GLUCAP,  in the last 168 hours  Radiological Exams on Admission: Dg Chest 2 View  10/19/2013   CLINICAL DATA:  Fever, confusion and fall.  EXAM: CHEST  2 VIEW  COMPARISON:  04/30/2013  FINDINGS: Stable appearance of the right dual lead cardiac pacemaker. Heart and mediastinum are stable. Post CABG changes in the chest. There is mild elevation of the posterior hemidiaphragms bilaterally and this is similar to the prior examination. No evidence for airspace disease or pleural effusions.  IMPRESSION: No acute cardiopulmonary disease.   Electronically Signed   By: Markus Daft M.D.   On: 10/19/2013 07:59   Ct Head Wo Contrast  10/19/2013   CLINICAL DATA:  Fall and complains of posterior neck pain.  EXAM: CT HEAD WITHOUT CONTRAST  CT CERVICAL SPINE WITHOUT CONTRAST  TECHNIQUE: Multidetector CT imaging of the head and cervical spine was performed following the standard protocol without intravenous contrast. Multiplanar CT image reconstructions of the cervical spine were also generated.  COMPARISON:  Head CT 07/01/2012  FINDINGS: CT HEAD FINDINGS  There is cerebral atrophy which has minimally changed. There is extensive low-density in the periventricular and subcortical white matter suggesting chronic changes. No evidence for acute hemorrhage, mass lesion, midline shift, hydrocephalus or large infarct. Again noted is an mixed sclerotic and lucent process involving the greater wing of the left sphenoid. This appears to obliterate the left sphenoid sinus and this has not significantly changed. Findings suggest a chronic benign process such as Paget's disease.  CT CERVICAL SPINE FINDINGS  Lung apices are clear. Negative for a fracture or dislocation. Multilevel degenerative disease in the cervical spine with disc space narrowing from C3-C7. Normal alignment at the cervical-thoracic junction. Multiple levels of the uncovertebral spurring.  IMPRESSION: No acute intracranial abnormality.  No acute bone abnormality  in the cervical spine.  Atrophy and evidence for chronic small vessel ischemic changes.  Multilevel degenerative disease in the cervical spine.   Electronically Signed   By: Markus Daft M.D.   On: 10/19/2013 08:12   Ct Cervical Spine Wo Contrast  10/19/2013   CLINICAL DATA:  Fall and complains of posterior neck pain.  EXAM: CT HEAD WITHOUT CONTRAST  CT CERVICAL SPINE WITHOUT CONTRAST  TECHNIQUE: Multidetector  CT imaging of the head and cervical spine was performed following the standard protocol without intravenous contrast. Multiplanar CT image reconstructions of the cervical spine were also generated.  COMPARISON:  Head CT 07/01/2012  FINDINGS: CT HEAD FINDINGS  There is cerebral atrophy which has minimally changed. There is extensive low-density in the periventricular and subcortical white matter suggesting chronic changes. No evidence for acute hemorrhage, mass lesion, midline shift, hydrocephalus or large infarct. Again noted is an mixed sclerotic and lucent process involving the greater wing of the left sphenoid. This appears to obliterate the left sphenoid sinus and this has not significantly changed. Findings suggest a chronic benign process such as Paget's disease.  CT CERVICAL SPINE FINDINGS  Lung apices are clear. Negative for a fracture or dislocation. Multilevel degenerative disease in the cervical spine with disc space narrowing from C3-C7. Normal alignment at the cervical-thoracic junction. Multiple levels of the uncovertebral spurring.  IMPRESSION: No acute intracranial abnormality.  No acute bone abnormality in the cervical spine.  Atrophy and evidence for chronic small vessel ischemic changes.  Multilevel degenerative disease in the cervical spine.   Electronically Signed   By: Markus Daft M.D.   On: 10/19/2013 08:12    EKG: Independently reviewed.   Assessment/Plan Active Problems:   Fever   Fever of unknown origin (FUO)   Fever Given multiple comorbidities, underlying dementia  aspiration of the concern Initial chest x-ray is negative UA negative Patient empirically being initiated on Rocephin and azithromycin Influenza PCR is also pending May benefit from repeat chest x-ray as the patient has had a cough Blood culture x2 are drawn  Falls Multiple risk factors including peripheral vascular disease, fever, dehydration, PT/OT eval  Repeat  2-D echo  Atherosclerosis of native arteries of the extremities with gangrene  -s/p Toe amputation due to non healing ulcer and gangrene  -cont to follow up with Ortho and Vein and vascular, when necessary  Chronic systolic CHF (congestive heart failure)  -stable on current medications, no signs of worsening CHF , last 2-D echo was 4/13, EF 30-35% We'll repeat a 2-D echo Continue Coreg Hold Lasix and the patient appears dehydrated  Acute on chronic CKD (chronic kidney disease), stage III  Baseline creatinine of 2.4 Gentle IV hydration  Type 2 diabetes, uncontrolled, with renal manifestation  -pt has been non-complaint in the past, with his memory loss he needs assistance at home with mediations,  Continue lantus to 10 units   -will need to cont tradjenta    Dyslipidemia  -conts zetia    Memory loss  -scoring mild- to moderate memory loss at [facility], will need ongoing follow up with PCP     PAD (peripheral artery disease)  -05/01/13 pt underwent left femoral to above knee popliteal artery bypass graft  -conts asa     Elevated troponin Continue to trend  This could be in setting of CKD and CHF On asa, beta blocker  Code Status:   full Family Communication: bedside Disposition Plan: admit   Time spent: 70 mins   [facility] Pager [phone]  If 7PM-7AM, please contact night-coverage [email or web address] Password [facility] 10/19/2013, 10:32 AM

## 2013-10-19 NOTE — Evaluation (Signed)
Clinical/Bedside Swallow Evaluation Patient Details  Name: Colin [name] MRN: [id number] Date of Birth: 07-Jul-1928  Today's Date: 10/19/2013 Time: 1330-1340 SLP Time Calculation (min): 10 min  Past Medical History:  Past Medical History  Diagnosis Date  . Coronary artery disease     Status post CABG in 2004  . Peripheral vascular disease     Status post right femoropopliteal bypass  . Dyslipidemia   . Diabetes mellitus with peripheral artery disease   . Hypertension   . Chronic renal insufficiency     creatinine around 2.4  . CHF (congestive heart failure) 2007    ef 38%  . History of CVA (cerebrovascular accident)   . Cardiomyopathy   . COPD (chronic obstructive pulmonary disease)   . History of anemia   . Myocardial infarction   . Shortness of breath   . Pacemaker   . Pneumonia   . Anginal pain   . Dementia   . GERD (gastroesophageal reflux disease)   . Arthritis    Past Surgical History:  Past Surgical History  Procedure Laterality Date  . Insert / replace / remove pacemaker  05/17/2005    [employer]. jude dual chamber ddd mode   . Coronary artery bypass graft  4 of 2004    4 vessel bypass  . Femoral bypass  july 2006    right with right great toe amputation  . Cardiac catheterization  04/29/2002    Dr. Glade [name]  . Esophagogastroduodenoscopy  07/21/2011    Procedure: ESOPHAGOGASTRODUODENOSCOPY (EGD);  Surgeon: Colin Beams, MD;  Location: [facility];  Service: Endoscopy;  Laterality: N/A;  . Coronary angioplasty    . Femoral-popliteal bypass graft Left 05/01/2013    Procedure: LEFT FEMORAL TO ABOVE KNEE POPLITEAL ARTERY BYPASS GRAFT WITH INTRAOPERATIVE ARTEROGRAM;  Surgeon: Colin Misty, MD;  Location: [place];  Service: Vascular;  Laterality: Left;  . Amputation Left 05/05/2013    Procedure: AMPUTATION RAY- FIRST AND SECOND-LEFT FOOT;  Surgeon: Colin Minion, MD;  Location: [place];  Service: Orthopedics;  Laterality: Left;   HPI:  80 year old male with history of  coronary artery disease, peripheral vascular disease, COPD, history of CVA, history of congestive heart failure, who presents with a fever and falls. Apparently the patient has had intermittent low-grade fever, weakness, poor oral intake, myalgia, headache and neck pain.  Symptoms have been ongoing for about one to 2 weeks. Because of the above symptoms the patient has gradually had difficulty ambulating. Apparently he has had his head multiple times but has not lost consciousness. Patient was found to be febrile with temperature of 101.5 CXR:  9/27 No acute cardiopulmonary disease   Assessment / Plan / Recommendation Clinical Impression  Pt presents with normal oropharyngeal swallow with sufficient mastication of solids despite absence of teeth; swift swallow trigger; appropriate exhalatory sequence post-swallow; no s/s of aspiration.  Recommend continue current regular consistency diet.  No SLP f/u is indicated.      Aspiration Risk  Mild    Diet Recommendation Regular;Thin liquid   Liquid Administration via: Cup;Straw Medication Administration: Whole meds with liquid Supervision: Patient able to self feed    Other  Recommendations Oral Care Recommendations: Oral care BID   Follow Up Recommendations  None      Swallow Study Prior Functional Status       General Date of Onset: 10/19/13 HPI: 80 year old male with history of coronary artery disease, peripheral vascular disease, COPD, history of CVA, history of congestive heart failure, who presents  with a fever and falls. Apparently the patient has had intermittent low-grade fever, weakness, poor oral intake, myalgia, headache and neck pain.  Symptoms have been ongoing for about one to 2 weeks. Because of the above symptoms the patient has gradually had difficulty ambulating. Apparently he has had his head multiple times but has not lost consciousness. Patient was found to be febrile with temperature of 101.5 Type of Study: Bedside swallow  evaluation Previous Swallow Assessment: none per records Diet Prior to this Study: Regular;Thin liquids Temperature Spikes Noted: Yes Respiratory Status: Room air History of Recent Intubation: No Behavior/Cognition: Alert;Cooperative Oral Cavity - Dentition: Edentulous Self-Feeding Abilities: Able to feed self Patient Positioning: Upright in bed Baseline Vocal Quality: Clear Volitional Cough: Strong Volitional Swallow: Able to elicit    Oral/Motor/Sensory Function Overall Oral Motor/Sensory Function: Appears within functional limits for tasks assessed   Ice Chips Ice chips: Not tested   Thin Liquid Thin Liquid: Within functional limits Presentation: Straw;Cup    Nectar Thick Nectar Thick Liquid: Not tested   Honey Thick Honey Thick Liquid: Not tested   Puree Puree: Within functional limits Presentation: Self Fed;Spoon   Solid  Colin [name] L. [name], [place] CCC/SLP Pager [phone]     Solid: Within functional limits Presentation: Self Fed       Colin [name] 10/19/2013,1:43 PM

## 2013-10-19 NOTE — Progress Notes (Signed)
  Echocardiogram 2D Echocardiogram has been performed.  Darlina Sicilian M 10/19/2013, 3:04 PM

## 2013-10-19 NOTE — ED Notes (Signed)
Patient transported to X-ray 

## 2013-10-19 NOTE — ED Notes (Signed)
Ernst Bowler, pt's daughter at bedside and reports pt has only taken his home meds once this week. Pt states he took it twice this week. Per daughter based on his weekly pill organizer it shows only 1 day missing. Daughter also reports he looks like he has lost an approx 10 lbs in the last 2 weeks.

## 2013-10-20 DIAGNOSIS — N183 Chronic kidney disease, stage 3 unspecified: Secondary | ICD-10-CM

## 2013-10-20 DIAGNOSIS — R5381 Other malaise: Secondary | ICD-10-CM

## 2013-10-20 DIAGNOSIS — W19XXXA Unspecified fall, initial encounter: Secondary | ICD-10-CM

## 2013-10-20 DIAGNOSIS — I5022 Chronic systolic (congestive) heart failure: Secondary | ICD-10-CM

## 2013-10-20 DIAGNOSIS — I509 Heart failure, unspecified: Secondary | ICD-10-CM

## 2013-10-20 DIAGNOSIS — R5383 Other fatigue: Secondary | ICD-10-CM

## 2013-10-20 DIAGNOSIS — R7881 Bacteremia: Secondary | ICD-10-CM | POA: Diagnosis present

## 2013-10-20 DIAGNOSIS — E1129 Type 2 diabetes mellitus with other diabetic kidney complication: Secondary | ICD-10-CM

## 2013-10-20 DIAGNOSIS — R531 Weakness: Secondary | ICD-10-CM

## 2013-10-20 DIAGNOSIS — E1165 Type 2 diabetes mellitus with hyperglycemia: Secondary | ICD-10-CM

## 2013-10-20 LAB — COMPREHENSIVE METABOLIC PANEL
ALT: 14 U/L (ref 0–53)
AST: 20 U/L (ref 0–37)
Albumin: 2.2 g/dL — ABNORMAL LOW (ref 3.5–5.2)
Alkaline Phosphatase: 65 U/L (ref 39–117)
Anion gap: 10 (ref 5–15)
BUN: 68 mg/dL — ABNORMAL HIGH (ref 6–23)
CO2: 21 mEq/L (ref 19–32)
Calcium: 9.1 mg/dL (ref 8.4–10.5)
Chloride: 104 mEq/L (ref 96–112)
Creatinine, Ser: 2.15 mg/dL — ABNORMAL HIGH (ref 0.50–1.35)
GFR calc non Af Amer: 27 mL/min — ABNORMAL LOW (ref >90)
GFR, EST AFRICAN AMERICAN: 31 mL/min — AB (ref >90)
GLUCOSE: 167 mg/dL — AB (ref 70–99)
POTASSIUM: 4.9 meq/L (ref 3.7–5.3)
Sodium: 135 mEq/L — ABNORMAL LOW (ref 137–147)
TOTAL PROTEIN: 6.9 g/dL (ref 6.0–8.3)
Total Bilirubin: 0.3 mg/dL (ref 0.3–1.2)

## 2013-10-20 LAB — TROPONIN I: TROPONIN I: 0.59 ng/mL — AB (ref <0.30)

## 2013-10-20 LAB — GLUCOSE, CAPILLARY
GLUCOSE-CAPILLARY: 266 mg/dL — AB (ref 70–99)
GLUCOSE-CAPILLARY: 232 mg/dL — AB (ref 70–99)
GLUCOSE-CAPILLARY: 136 mg/dL — AB (ref 70–99)
Glucose-Capillary: 188 mg/dL — ABNORMAL HIGH (ref 70–99)
Glucose-Capillary: 161 mg/dL — ABNORMAL HIGH (ref 70–99)
Glucose-Capillary: 231 mg/dL — ABNORMAL HIGH (ref 70–99)

## 2013-10-20 LAB — HEMOGLOBIN A1C
Hgb A1c MFr Bld: 7.9 % — ABNORMAL HIGH (ref <5.7)
Hgb A1c MFr Bld: 7.9 % — ABNORMAL HIGH (ref <5.7)
Mean Plasma Glucose: 180 mg/dL — ABNORMAL HIGH (ref <117)
Mean Plasma Glucose: 180 mg/dL — ABNORMAL HIGH (ref <117)

## 2013-10-20 LAB — CBC
HEMATOCRIT: 28.7 % — AB (ref 39.0–52.0)
Hemoglobin: 9 g/dL — ABNORMAL LOW (ref 13.0–17.0)
MCH: 24 pg — AB (ref 26.0–34.0)
MCHC: 31.4 g/dL (ref 30.0–36.0)
MCV: 76.5 fL — ABNORMAL LOW (ref 78.0–100.0)
Platelets: 216 10*3/uL (ref 150–400)
RBC: 3.75 MIL/uL — ABNORMAL LOW (ref 4.22–5.81)
RDW: 16.8 % — ABNORMAL HIGH (ref 11.5–15.5)
WBC: 11.9 10*3/uL — ABNORMAL HIGH (ref 4.0–10.5)

## 2013-10-20 LAB — URINE CULTURE: Colony Count: >=100000

## 2013-10-20 LAB — INFLUENZA PANEL BY PCR (TYPE A & B)
H1N1 flu by pcr: NOT DETECTED
INFLBPCR: NEGATIVE
Influenza A By PCR: NEGATIVE

## 2013-10-20 MED ORDER — INSULIN ASPART 100 UNIT/ML ~~LOC~~ SOLN
0.0000 [IU] | Freq: Every day | SUBCUTANEOUS | Status: DC
  Administered 2013-10-23 – 2013-10-28 (×2): 2 [IU] via SUBCUTANEOUS

## 2013-10-20 MED ORDER — INSULIN GLARGINE 100 UNIT/ML ~~LOC~~ SOLN
25.0000 [IU] | Freq: Every day | SUBCUTANEOUS | Status: DC
  Administered 2013-10-20 – 2013-10-24 (×5): 25 [IU] via SUBCUTANEOUS
  Filled 2013-10-20 (×6): qty 0.25

## 2013-10-20 MED ORDER — INFLUENZA VAC SPLIT QUAD 0.5 ML IM SUSY
0.5000 mL | PREFILLED_SYRINGE | INTRAMUSCULAR | Status: AC
Start: 2013-10-21 — End: 2013-10-21
  Administered 2013-10-21: 0.5 mL via INTRAMUSCULAR
  Filled 2013-10-20: qty 0.5

## 2013-10-20 MED ORDER — INSULIN ASPART 100 UNIT/ML ~~LOC~~ SOLN
0.0000 [IU] | Freq: Three times a day (TID) | SUBCUTANEOUS | Status: DC
  Administered 2013-10-20 – 2013-10-21 (×2): 5 [IU] via SUBCUTANEOUS
  Administered 2013-10-21: 2 [IU] via SUBCUTANEOUS
  Administered 2013-10-22: 3 [IU] via SUBCUTANEOUS
  Administered 2013-10-22 – 2013-10-23 (×2): 2 [IU] via SUBCUTANEOUS
  Administered 2013-10-24 – 2013-10-26 (×3): 3 [IU] via SUBCUTANEOUS
  Administered 2013-10-27: 2 [IU] via SUBCUTANEOUS
  Administered 2013-10-29: 3 [IU] via SUBCUTANEOUS
  Administered 2013-10-29 – 2013-10-30 (×2): 2 [IU] via SUBCUTANEOUS
  Administered 2013-10-30 – 2013-10-31 (×2): 3 [IU] via SUBCUTANEOUS

## 2013-10-20 NOTE — Evaluation (Signed)
Occupational Therapy Evaluation Patient Details Name: Colin [name] MRN: [id number] DOB: 1928-09-22 Today's Date: 10/20/2013    History of Present Illness 80 year old male with history of CAD,PVD, COPD, history of CVA, history of CHF with EF of 30%, who presents with a fever and falls. Apparently the patient has had intermittent low-grade fever, weakness, poor oral intake, myalgia, headache and neck pain for about one to 2 weeks. Because of the above symptoms the patient has gradually had difficulty ambulating. Apparently he has hit his head multiple times but has not lost consciousness. Patient with a temp of 101.5,  clinically dehydrated, WBC count of 12,000.    Clinical Impression   This 80 yo male admitted with above presents to acute OT with decreased balance affecting his ability to care for himself at an Independent to Mod I level as he did pta. He will benefit from acute OT with follow up [place] to get back to these levels.    Follow Up Recommendations  Home health OT    Equipment Recommendations  None recommended by OT       Precautions / Restrictions Precautions Precautions: Fall Precaution Comments: right forefoot ambutation and left great and 2nd toe amputation Required Braces or Orthoses: Other Brace/Splint Other Brace/Splint: per notes from last admission, pt has prosthetic shoe; he was unable to tell us he has prosthetic shoe today Restrictions Weight Bearing Restrictions: No      Mobility Bed Mobility Overal bed mobility: Modified Independent             General bed mobility comments: HOB up about 20 degrees, use of rail with pt rolling over to his left side and then pushing up  Transfers Overall transfer level: Needs assistance Equipment used: Rolling walker (2 wheeled) Transfers: Sit to/from Stand Sit to Stand: Mod assist;Min assist         General transfer comment: mod assist for sit to stand initially, when pt did not use RW as we wanted to assess  how he would do standing first, then using cane; Pt dependent on bracing LEs against bed for stability, and sat back down rather quickly; Second attempt with a RW, which went better; Cues for safety, weight shift onto feet, and hand placement    Balance Overall balance assessment: Needs assistance         Standing balance support: Bilateral upper extremity supported Standing balance-Leahy Scale: Poor Standing balance comment: dependent on UE support, at least one, standing at sink                            ADL Overall ADL's : Needs assistance/impaired Eating/Feeding: Independent;Sitting   Grooming: Set up;Sitting   Upper Body Bathing: Set up;Sitting   Lower Body Bathing: Set up (with min A sit<>stand)   Upper Body Dressing : Set up;Sitting   Lower Body Dressing: Set up (with min A sit<>stand) Lower Body Dressing Details (indicate cue type and reason): He did struggle to get his left sock on--feel like with more time he could have done this and he reports he does do this at home Toilet Transfer: Minimal assistance;Ambulation;RW (from bed>out door>back to room to recliner)   Toileting- Clothing Manipulation and Hygiene: Minimal assistance (with min A sit<>stand)                         Pertinent Vitals/Pain Pain Assessment: 0-10 Pain Score: 3  Pain Location: right lower ribs  Pain Descriptors / Indicators: Cramping;Stabbing (intermittently) Pain Intervention(s): Monitored during session     Hand Dominance Right   Extremity/Trunk Assessment Upper Extremity Assessment Upper Extremity Assessment: RUE deficits/detail;LUE deficits/detail RUE Sensation: decreased proprioception (appears to based on how he is struggling with donning socks) LUE Sensation: decreased proprioception (appears to based on how he is struggling with donning socks)   Lower Extremity Assessment Lower Extremity Assessment: Generalized weakness;RLE deficits/detail;LLE  deficits/detail RLE Deficits / Details: Transmetatarsal amputation LLE Deficits / Details: L first and second ray amputations       Communication Communication Communication: No difficulties   Cognition Arousal/Alertness: Awake/alert Behavior During Therapy: WFL for tasks assessed/performed Overall Cognitive Status: History of cognitive impairments - at baseline (noted history of dementia in chart)       Memory: Decreased short-term memory                        Home Living Family/patient expects to be discharged to:: Private residence Living Arrangements: Spouse/significant other Available Help at Discharge: Family;Available 24 hours/day Type of Home: House Home Access: Level entry     Home Layout: One level     Bathroom Shower/Tub: Occupational psychologist: Standard     Home Equipment: Shower seat;Bedside commode;Walker - 2 wheels          Prior Functioning/Environment Level of Independence: Independent with assistive device(s)        Comments: states he uses a cane consistently; when pressed, he did say he has a RW and that he will use the RW    OT Diagnosis: Generalized weakness;Acute pain   OT Problem List: Decreased strength;Impaired balance (sitting and/or standing);Pain   OT Treatment/Interventions: Self-care/ADL training;DME and/or AE instruction;Balance training;Patient/family education    OT Goals(Current goals can be found in the care plan section) Acute Rehab OT Goals Patient Stated Goal: did not ask  OT Frequency: Min 2X/week           Co-evaluation PT/OT/SLP Co-Evaluation/Treatment: Yes (partial) Reason for Co-Treatment: For patient/therapist safety PT goals addressed during session: Mobility/safety with mobility OT goals addressed during session: ADL's and self-care      End of Session Equipment Utilized During Treatment: Gait belt;Rolling walker  Activity Tolerance: Patient tolerated treatment well Patient left:  in chair;with call bell/phone within reach;with chair alarm set   Time: 1340-1417 OT Time Calculation (min): 37 min Charges:  OT General Charges $OT Visit: 1 Procedure OT Evaluation $Initial OT Evaluation Tier I: 1 Procedure OT Treatments $Self Care/Home Management : 8-22 mins  Almon Register [id number] 10/20/2013, 3:32 PM

## 2013-10-20 NOTE — Progress Notes (Signed)
Utilization review completed.  

## 2013-10-20 NOTE — Progress Notes (Signed)
[facility] PROGRESS NOTE  Colin [name] [id number] DOB: 1928-08-18 DOA: 10/19/2013 PCP: Horatio Pel, MD Chart reviewed  Assessment/Plan:  Principal Problem:   Fever resolved. Had cough and congestion with negative CXR, negative UA. Flu swab negative. 1/2 bc growing GPC pairs. Continue empiric rocephin for now. Also azithro for now. Check urine pneumococcal ag Active Problems:   CORONARY ARTERY DISEASE   Cardiac pacemaker in situ   Chronic systolic CHF (congestive heart failure): hypovolemic. Continue gentle IVF   Peripheral vascular disease, unspecified   CKD (chronic kidney disease), stage III. Baseline creat around 2, but BUN higher than previous, likely volume depletion   Falls at home, multiple. PT eval pending. Per RN, wife requesting SNF if qualifies. She can't care for him.   Type 2 diabetes, uncontrolled, with renal manifestation: adjust insulin   Weakness generalized protien calorie malnutrition troponins trending down and no CP. Likely from renal failure.  Code Status:  full Family Communication:   Disposition Plan:  Possibly SNF  Consultants:    Procedures:     Antibiotics:  Rocephin azithro. 10/20/13  HPI/Subjective: Some cough. Has not been OOB. Some right rib pain and head pain from fall.  Objective: Filed Vitals:   10/20/13 0931  BP: 109/44  Pulse: 60  Temp: 98.4 F (36.9 C)  Resp: 20    Intake/Output Summary (Last 24 hours) at 10/20/13 1316 Last data filed at 10/20/13 1055  Gross per 24 hour  Intake 1283.75 ml  Output    180 ml  Net 1103.75 ml   Filed Weights   10/19/13 1155 10/19/13 2100  Weight: 67.7 kg (149 lb 4 oz) 68.7 kg (151 lb 7.3 oz)    Exam:   General:  Frail, elderly. Oriented  HEENT: normocephalic, atraumatic  Cardiovascular: RRR without MGR  Respiratory: CTA without WRR  Abdomen: s, nt, nd  Ext: no CCE  Basic Metabolic Panel:  Recent Labs Lab 10/19/13 0651 10/19/13 1200  10/20/13 0532  NA 135*  --  135*  K 5.2  --  4.9  CL 98  --  104  CO2 21  --  21  GLUCOSE 214*  --  167*  BUN 83*  --  68*  CREATININE 2.75* 2.51* 2.15*  CALCIUM 10.1  --  9.1   Liver Function Tests:  Recent Labs Lab 10/19/13 0651 10/20/13 0532  AST 24 20  ALT 18 14  ALKPHOS 84 65  BILITOT 0.7 0.3  PROT 9.4* 6.9  ALBUMIN 2.9* 2.2*   No results found for this basename: LIPASE, AMYLASE,  in the last 168 hours No results found for this basename: AMMONIA,  in the last 168 hours CBC:  Recent Labs Lab 10/19/13 0651 10/19/13 1200 10/20/13 0532  WBC 12.0* 17.6* 11.9*  NEUTROABS 9.8*  --   --   HGB 10.9* 9.1* 9.0*  HCT 34.5* 28.6* 28.7*  MCV 77.5* 76.3* 76.5*  PLT 250 205 216   Cardiac Enzymes:  Recent Labs Lab 10/19/13 1200 10/19/13 1707 10/19/13 2208  TROPONINI 0.59* 0.39* <0.30   BNP (last 3 results)  Recent Labs  10/19/13 1200  PROBNP 2214.0*   CBG:  Recent Labs Lab 10/19/13 1151 10/19/13 1657 10/19/13 2154 10/20/13 0742 10/20/13 1053  GLUCAP 252* 266* 188* 161* 231*    Recent Results (from the past 240 hour(s))  CULTURE, BLOOD (ROUTINE X 2)     Status: None   Collection Time    10/19/13  6:52 AM      Result Value  Ref Range Status   Specimen Description BLOOD RIGHT ARM   Final   Special Requests BOTTLES DRAWN AEROBIC AND ANAEROBIC 5CC EA   Final   Culture  Setup Time     Final   Value: 10/19/2013 13:29     Performed at [facility]   Culture     Final   Value:        BLOOD CULTURE RECEIVED NO GROWTH TO DATE CULTURE WILL BE HELD FOR 5 DAYS BEFORE ISSUING A FINAL NEGATIVE REPORT     Performed at [facility]   Report Status PENDING   Incomplete  CULTURE, BLOOD (ROUTINE X 2)     Status: None   Collection Time    10/19/13  6:58 AM      Result Value Ref Range Status   Specimen Description BLOOD LEFT ARM   Final   Special Requests BOTTLES DRAWN AEROBIC ONLY 2CC   Final   Culture  Setup Time     Final   Value: 10/19/2013  13:29     Performed at [facility]   Culture     Final   Value:        BLOOD CULTURE RECEIVED NO GROWTH TO DATE CULTURE WILL BE HELD FOR 5 DAYS BEFORE ISSUING A FINAL NEGATIVE REPORT     Performed at [facility]   Report Status PENDING   Incomplete     Studies: Dg Chest 2 View  10/19/2013   CLINICAL DATA:  Fever, confusion and fall.  EXAM: CHEST  2 VIEW  COMPARISON:  04/30/2013  FINDINGS: Stable appearance of the right dual lead cardiac pacemaker. Heart and mediastinum are stable. Post CABG changes in the chest. There is mild elevation of the posterior hemidiaphragms bilaterally and this is similar to the prior examination. No evidence for airspace disease or pleural effusions.  IMPRESSION: No acute cardiopulmonary disease.   Electronically Signed   By: Markus Daft M.D.   On: 10/19/2013 07:59   Ct Head Wo Contrast  10/19/2013   CLINICAL DATA:  Fall and complains of posterior neck pain.  EXAM: CT HEAD WITHOUT CONTRAST  CT CERVICAL SPINE WITHOUT CONTRAST  TECHNIQUE: Multidetector CT imaging of the head and cervical spine was performed following the standard protocol without intravenous contrast. Multiplanar CT image reconstructions of the cervical spine were also generated.  COMPARISON:  Head CT 07/01/2012  FINDINGS: CT HEAD FINDINGS  There is cerebral atrophy which has minimally changed. There is extensive low-density in the periventricular and subcortical white matter suggesting chronic changes. No evidence for acute hemorrhage, mass lesion, midline shift, hydrocephalus or large infarct. Again noted is an mixed sclerotic and lucent process involving the greater wing of the left sphenoid. This appears to obliterate the left sphenoid sinus and this has not significantly changed. Findings suggest a chronic benign process such as Paget's disease.  CT CERVICAL SPINE FINDINGS  Lung apices are clear. Negative for a fracture or dislocation. Multilevel degenerative disease in the cervical spine  with disc space narrowing from C3-C7. Normal alignment at the cervical-thoracic junction. Multiple levels of the uncovertebral spurring.  IMPRESSION: No acute intracranial abnormality.  No acute bone abnormality in the cervical spine.  Atrophy and evidence for chronic small vessel ischemic changes.  Multilevel degenerative disease in the cervical spine.   Electronically Signed   By: Markus Daft M.D.   On: 10/19/2013 08:12   Ct Cervical Spine Wo Contrast  10/19/2013   CLINICAL DATA:  Fall and  complains of posterior neck pain.  EXAM: CT HEAD WITHOUT CONTRAST  CT CERVICAL SPINE WITHOUT CONTRAST  TECHNIQUE: Multidetector CT imaging of the head and cervical spine was performed following the standard protocol without intravenous contrast. Multiplanar CT image reconstructions of the cervical spine were also generated.  COMPARISON:  Head CT 07/01/2012  FINDINGS: CT HEAD FINDINGS  There is cerebral atrophy which has minimally changed. There is extensive low-density in the periventricular and subcortical white matter suggesting chronic changes. No evidence for acute hemorrhage, mass lesion, midline shift, hydrocephalus or large infarct. Again noted is an mixed sclerotic and lucent process involving the greater wing of the left sphenoid. This appears to obliterate the left sphenoid sinus and this has not significantly changed. Findings suggest a chronic benign process such as Paget's disease.  CT CERVICAL SPINE FINDINGS  Lung apices are clear. Negative for a fracture or dislocation. Multilevel degenerative disease in the cervical spine with disc space narrowing from C3-C7. Normal alignment at the cervical-thoracic junction. Multiple levels of the uncovertebral spurring.  IMPRESSION: No acute intracranial abnormality.  No acute bone abnormality in the cervical spine.  Atrophy and evidence for chronic small vessel ischemic changes.  Multilevel degenerative disease in the cervical spine.   Electronically Signed   By: Markus Daft  M.D.   On: 10/19/2013 08:12    Scheduled Meds: . aspirin EC  81 mg Oral Daily  . azithromycin  500 mg Intravenous Q24H  . carvedilol  6.25 mg Oral BID WC  . cefTRIAXone (ROCEPHIN)  IV  1 g Intravenous Q24H  . docusate sodium  100 mg Oral BID  . enoxaparin (LOVENOX) injection  30 mg Subcutaneous Q24H  . ezetimibe  10 mg Oral Daily  . [START ON 10/21/2013] Influenza vac split quadrivalent PF  0.5 mL Intramuscular Tomorrow-1000  . insulin aspart  0-9 Units Subcutaneous TID WC  . insulin glargine  20 Units Subcutaneous QHS  . isosorbide mononitrate  30 mg Oral Daily  . linagliptin  5 mg Oral Daily  . pantoprazole  40 mg Oral Daily  . sodium chloride  3 mL Intravenous Q12H  . vitamin B-12  1,000 mcg Oral Daily   Continuous Infusions: . sodium chloride 75 mL/hr at 10/20/13 0730    Time spent: 35 minutes  [facility] Pager [phone]. If 7PM-7AM, please contact night-coverage at [email or web address], password [facility] 10/20/2013, 1:16 PM  LOS: 1 day

## 2013-10-20 NOTE — Evaluation (Signed)
Physical Therapy Evaluation Patient Details Name: Colin [name] MRN: [id number] DOB: January 27, 1928 Today's Date: 10/20/2013   History of Present Illness  80 year old male with history of coronary artery disease, peripheral vascular disease, COPD, history of CVA, history of congestive heart failure with EF of 30%, who presents with a fever and falls. Apparently the patient has had intermittent low-grade fever, weakness, poor oral intake, myalgia, headache and neck pain. History was obtained from the patient's family in ED notes. Symptoms have been ongoing for about one to 2 weeks. Because of the above symptoms the patient has gradually had difficulty ambulating. Apparently he has had his head multiple times but has not lost consciousness. Patient was found to be febrile with temperature of 101.5. Clinically dehydrated creatinine increased from baseline, WBC count of 12,000. Hemodynamically stable. UA, chest x-ray negative.  Clinical Impression   Pt admitted with above. Pt currently with functional limitations due to the deficits listed below (see PT Problem List).  Pt will benefit from skilled PT to increase their independence and safety with mobility to allow discharge to the venue listed below.       Follow Up Recommendations Home health PT;Supervision/Assistance - 24 hour    Equipment Recommendations  3in1 (PT)    Recommendations for Other Services       Precautions / Restrictions Precautions Precautions: Fall Required Braces or Orthoses: Other Brace/Splint Other Brace/Splint: per notes from last admission, pt has prosthetic shoe; he was unable to tell us he has prosthetic shoe today Restrictions Weight Bearing Restrictions: No      Mobility  Bed Mobility               General bed mobility comments: pt was EOB once I arrived  Transfers Overall transfer level: Needs assistance Equipment used: Rolling walker (2 wheeled) Transfers: Sit to/from Stand Sit to Stand: Mod  assist;Min assist         General transfer comment: mod assist for sit to stand initially, when pt did not use RW as we wanted to assess how he would do standing first, then using cane; Pt dependent on bracing LEs against bed for stability, and sat back down rather quickly; Second attempt with a RW, which went better; Cues for safety, weight shift onto feet, and hand placement  Ambulation/Gait Ambulation/Gait assistance: Min guard (with physical contact) Ambulation Distance (Feet): 80 Feet Assistive device: Rolling walker (2 wheeled) Gait Pattern/deviations: Decreased stride length;Decreased step length - right Gait velocity: quite slow   General Gait Details: Cues mostly for posture and RW proximity; noted inefficient gait pattern with tendency to have UEs and upper trunk and head in front of feet  Stairs            Wheelchair Mobility    Modified Rankin (Stroke Patients Only)       Balance Overall balance assessment: Needs assistance         Standing balance support: Bilateral upper extremity supported Standing balance-Leahy Scale: Poor Standing balance comment: dependent on UE support, at least one, standing at sink                             Pertinent Vitals/Pain Pain Assessment: 0-10 Pain Score: 3  Pain Location: R lower ribs Pain Descriptors / Indicators:  (Pain with deep breathing and cough) Pain Intervention(s): Monitored during session    Home Living Family/patient expects to be discharged to:: Private residence Living Arrangements: Spouse/significant other Available Help at  Discharge: Family;Available 24 hours/day Type of Home: House Home Access: Level entry     Home Layout: One level Home Equipment: Shower seat;Bedside commode;Walker - 2 wheels      Prior Function Level of Independence: Independent with assistive device(s)         Comments: states he uses a cane consistently; when pressed, he did say he has a RW and that he  will use the RW     Hand Dominance   Dominant Hand: Right    Extremity/Trunk Assessment   Upper Extremity Assessment: Overall WFL for tasks assessed           Lower Extremity Assessment: Generalized weakness;RLE deficits/detail;LLE deficits/detail RLE Deficits / Details: Transmetatarsal amputation LLE Deficits / Details: L first and second ray amputations     Communication   Communication: No difficulties  Cognition Arousal/Alertness: Awake/alert Behavior During Therapy: WFL for tasks assessed/performed Overall Cognitive Status: History of cognitive impairments - at baseline (noted history of dementia)       Memory: Decreased short-term memory              General Comments      Exercises        Assessment/Plan    PT Assessment Patient needs continued PT services  PT Diagnosis Difficulty walking;Generalized weakness   PT Problem List Decreased strength;Decreased activity tolerance;Decreased balance;Decreased mobility;Decreased coordination;Decreased cognition;Decreased knowledge of use of DME;Decreased safety awareness;Pain  PT Treatment Interventions DME instruction;Gait training;Stair training;Functional mobility training;Therapeutic activities;Therapeutic exercise;Neuromuscular re-education   PT Goals (Current goals can be found in the Care Plan section) Acute Rehab PT Goals Patient Stated Goal: did not state PT Goal Formulation: With patient Time For Goal Achievement: 11/03/13 Potential to Achieve Goals: Good    Frequency Min 3X/week   Barriers to discharge        Co-evaluation PT/OT/SLP Co-Evaluation/Treatment: Yes Reason for Co-Treatment: For patient/therapist safety;Other (comment) (Co-eval; will treat single discipline) PT goals addressed during session: Mobility/safety with mobility         End of Session Equipment Utilized During Treatment: Gait belt Activity Tolerance: Patient tolerated treatment well Patient left: in chair;with call  bell/phone within reach;with chair alarm set Nurse Communication: Mobility status         Time: 1350-1415 PT Time Calculation (min): 25 min   Charges:   PT Evaluation $Initial PT Evaluation Tier I: 1 Procedure PT Treatments $Gait Training: 8-22 mins   PT G Codes:          Quin Hoop 10/20/2013, 2:44 PM  Roney Marion, [place] Pager [phone] Office [phone]

## 2013-10-20 NOTE — Progress Notes (Signed)
CRITICAL VALUE ALERT  Critical value received: Gram positive cocci in pairs Date of notification:  10/20/2013 Time of notification:  13:20 pm  Critical value read back:Yes.    Nurse who received alert:  Alden Server  MD notified (1st page):  Dr. Doree Barthel  Time of first page:  13:25 pm  MD notified (2nd page):  Time of second page:  Responding MD: Dr. Doree Barthel  Time MD responded: 13: 35 pm

## 2013-10-21 DIAGNOSIS — T827XXA Infection and inflammatory reaction due to other cardiac and vascular devices, implants and grafts, initial encounter: Secondary | ICD-10-CM | POA: Diagnosis not present

## 2013-10-21 DIAGNOSIS — N179 Acute kidney failure, unspecified: Secondary | ICD-10-CM

## 2013-10-21 LAB — BASIC METABOLIC PANEL
ANION GAP: 10 (ref 5–15)
BUN: 49 mg/dL — ABNORMAL HIGH (ref 6–23)
CHLORIDE: 109 meq/L (ref 96–112)
CO2: 20 mEq/L (ref 19–32)
CREATININE: 1.8 mg/dL — AB (ref 0.50–1.35)
Calcium: 8.7 mg/dL (ref 8.4–10.5)
GFR calc Af Amer: 39 mL/min — ABNORMAL LOW (ref >90)
GFR calc non Af Amer: 33 mL/min — ABNORMAL LOW (ref >90)
Glucose, Bld: 108 mg/dL — ABNORMAL HIGH (ref 70–99)
POTASSIUM: 5.2 meq/L (ref 3.7–5.3)
Sodium: 139 mEq/L (ref 137–147)

## 2013-10-21 LAB — CBC WITH DIFFERENTIAL/PLATELET
BASOS PCT: 0 % (ref 0–1)
Basophils Absolute: 0 10*3/uL (ref 0.0–0.1)
Eosinophils Absolute: 0.1 10*3/uL (ref 0.0–0.7)
Eosinophils Relative: 2 % (ref 0–5)
HCT: 28.3 % — ABNORMAL LOW (ref 39.0–52.0)
Hemoglobin: 9 g/dL — ABNORMAL LOW (ref 13.0–17.0)
Lymphocytes Relative: 19 % (ref 12–46)
Lymphs Abs: 1.7 10*3/uL (ref 0.7–4.0)
MCH: 24.3 pg — ABNORMAL LOW (ref 26.0–34.0)
MCHC: 31.8 g/dL (ref 30.0–36.0)
MCV: 76.3 fL — ABNORMAL LOW (ref 78.0–100.0)
MONOS PCT: 9 % (ref 3–12)
Monocytes Absolute: 0.8 10*3/uL (ref 0.1–1.0)
NEUTROS PCT: 70 % (ref 43–77)
Neutro Abs: 6.3 10*3/uL (ref 1.7–7.7)
Platelets: 218 10*3/uL (ref 150–400)
RBC: 3.71 MIL/uL — AB (ref 4.22–5.81)
RDW: 17.1 % — ABNORMAL HIGH (ref 11.5–15.5)
WBC: 9 10*3/uL (ref 4.0–10.5)

## 2013-10-21 LAB — STREP PNEUMONIAE URINARY ANTIGEN: STREP PNEUMO URINARY ANTIGEN: NEGATIVE

## 2013-10-21 LAB — GLUCOSE, CAPILLARY
Glucose-Capillary: 115 mg/dL — ABNORMAL HIGH (ref 70–99)
Glucose-Capillary: 126 mg/dL — ABNORMAL HIGH (ref 70–99)
Glucose-Capillary: 217 mg/dL — ABNORMAL HIGH (ref 70–99)
Glucose-Capillary: 135 mg/dL — ABNORMAL HIGH (ref 70–99)

## 2013-10-21 MED ORDER — SODIUM CHLORIDE 0.9 % IV SOLN
1.0000 g | Freq: Three times a day (TID) | INTRAVENOUS | Status: DC
  Administered 2013-10-21: 1 g via INTRAVENOUS
  Filled 2013-10-21 (×3): qty 1000

## 2013-10-21 NOTE — Clinical Social Work Note (Signed)
CSW talked with wife by phone regarding consult for short-term rehab at a skiled facility. Mrs. [place] advised CSW that she will need to talk with her children first before making a decision and asked that CSW follow-up with her later this evening or tomorrow.  Alysha Doolan Givens, MSW, LCSW [phone]

## 2013-10-21 NOTE — Progress Notes (Signed)
Pharmacist Heart Failure Core Measure Documentation  Assessment: Colin [name] has an EF documented as 30-35% on 10/21/13 by ECHO.  Rationale: Heart failure patients with left ventricular systolic dysfunction (LVSD) and an EF < 40% should be prescribed an angiotensin converting enzyme inhibitor (ACEI) or angiotensin receptor blocker (ARB) at discharge unless a contraindication is documented in the medical record.  This patient is not currently on an ACEI or ARB for HF.  This note is being placed in the record in order to provide documentation that a contraindication to the use of these agents is present for this encounter.  ACE Inhibitor or Angiotensin Receptor Blocker is contraindicated (specify all that apply)  []   ACEI allergy AND ARB allergy []   Angioedema []   Moderate or severe aortic stenosis []   Hyperkalemia []   Hypotension []   Renal artery stenosis [x]   Worsening renal function, preexisting renal disease or dysfunction   Alycia Rossetti, PharmD, BCPS Clinical Pharmacist Pager: [phone] 10/21/2013 3:29 PM

## 2013-10-21 NOTE — Progress Notes (Signed)
CRITICAL VALUE ALERT  Critical value received:  Second set of BC growing [place]   Date of notification:  09/2813  Time of notification:  2225  Critical value read back:Yes.    Nurse who received alert:  Mady Gemma, RN  MD notified (1st page):  Tylene Fantasia, NP

## 2013-10-21 NOTE — Progress Notes (Addendum)
[facility] PROGRESS NOTE  Colin [name] [id number] DOB: Sep 24, 1928 DOA: 10/19/2013 PCP: Horatio Pel, MD Chart reviewed  Assessment/Plan:  Principal Problem: Bacteremia: 2/2 BC growing GPC pairs and chains. Urine strep pneumonia ag pending. Improving on rocephin. Continue current. Active Problems:   CORONARY ARTERY DISEASE   Cardiac pacemaker in situ   Chronic systolic CHF (congestive heart failure): no longer hypovolemic. Saline lock   Peripheral vascular disease, unspecified   CKD (chronic kidney disease), stage III. BUN down. Creat down. Saline lock   Falls at home, multiple. PT eval pending. Per RN, wife requesting SNF if qualifies. She can't care for him.   Type 2 diabetes, uncontrolled, with renal manifestation: adjust insulin   Weakness generalized protien calorie malnutrition troponins trending down and no CP. Likely from renal failure.  Code Status:  full Family Communication:   Disposition Plan:  Possibly SNF  Consultants:    Procedures:     Antibiotics:  Rocephin azithro. 10/20/13  HPI/Subjective: Feeling better. No dyspnea. Cough and congestion improved  Objective: Filed Vitals:   10/21/13 1145  BP: 145/53  Pulse: 64  Temp: 98.4 F (36.9 C)  Resp: 16    Intake/Output Summary (Last 24 hours) at 10/21/13 1325 Last data filed at 10/21/13 0540  Gross per 24 hour  Intake    470 ml  Output      0 ml  Net    470 ml   Filed Weights   10/19/13 1155 10/19/13 2100 10/20/13 2120  Weight: 67.7 kg (149 lb 4 oz) 68.7 kg (151 lb 7.3 oz) 68.7 kg (151 lb 7.3 oz)    Exam:   General:  Frail, elderly. Brighter. Eating lunch  HEENT: normocephalic, atraumatic  Cardiovascular: RRR without MGR  Respiratory: CTA without WRR  Abdomen: s, nt, nd  Ext: no CCE  Basic Metabolic Panel:  Recent Labs Lab 10/19/13 0651 10/19/13 1200 10/20/13 0532 10/21/13 0554  NA 135*  --  135* 139  K 5.2  --  4.9 5.2  CL 98  --  104 109  CO2  21  --  21 20  GLUCOSE 214*  --  167* 108*  BUN 83*  --  68* 49*  CREATININE 2.75* 2.51* 2.15* 1.80*  CALCIUM 10.1  --  9.1 8.7   Liver Function Tests:  Recent Labs Lab 10/19/13 0651 10/20/13 0532  AST 24 20  ALT 18 14  ALKPHOS 84 65  BILITOT 0.7 0.3  PROT 9.4* 6.9  ALBUMIN 2.9* 2.2*   No results found for this basename: LIPASE, AMYLASE,  in the last 168 hours No results found for this basename: AMMONIA,  in the last 168 hours CBC:  Recent Labs Lab 10/19/13 0651 10/19/13 1200 10/20/13 0532 10/21/13 0554  WBC 12.0* 17.6* 11.9* 9.0  NEUTROABS 9.8*  --   --  6.3  HGB 10.9* 9.1* 9.0* 9.0*  HCT 34.5* 28.6* 28.7* 28.3*  MCV 77.5* 76.3* 76.5* 76.3*  PLT 250 205 216 218   Cardiac Enzymes:  Recent Labs Lab 10/19/13 1200 10/19/13 1707 10/19/13 2208  TROPONINI 0.59* 0.39* <0.30   BNP (last 3 results)  Recent Labs  10/19/13 1200  PROBNP 2214.0*   CBG:  Recent Labs Lab 10/20/13 1053 10/20/13 1640 10/20/13 2117 10/21/13 0734 10/21/13 1142  GLUCAP 231* 232* 136* 115* 126*    Recent Results (from the past 240 hour(s))  CULTURE, BLOOD (ROUTINE X 2)     Status: None   Collection Time    10/19/13  6:52  AM      Result Value Ref Range Status   Specimen Description BLOOD RIGHT ARM   Final   Special Requests BOTTLES DRAWN AEROBIC AND ANAEROBIC 5CC EA   Final   Culture  Setup Time     Final   Value: 10/19/2013 13:29     Performed at [facility]   Culture     Final   Value: GRAM POSITIVE COCCI IN PAIRS     Note: Gram Stain Report Called to,Read Back By and Verified With: JAY NARAMDAS @ 1325 ON [id number] BY [facility]     Performed at [facility]   Report Status PENDING   Incomplete  CULTURE, BLOOD (ROUTINE X 2)     Status: None   Collection Time    10/19/13  6:58 AM      Result Value Ref Range Status   Specimen Description BLOOD LEFT ARM   Final   Special Requests BOTTLES DRAWN AEROBIC ONLY 2CC   Final   Culture  Setup Time     Final   Value:  10/19/2013 13:29     Performed at [facility]   Culture     Final   Value: GRAM POSITIVE COCCI IN PAIRS AND CHAINS     Note: Gram Stain Report Called to,Read Back By and Verified With: KAMI MOORE ON 10/20/2013 AT 10:25P BY WILEJ     Performed at [facility]   Report Status PENDING   Incomplete  URINE CULTURE     Status: None   Collection Time    10/19/13  9:12 AM      Result Value Ref Range Status   Specimen Description URINE, RANDOM   Final   Special Requests NONE   Final   Culture  Setup Time     Final   Value: 10/19/2013 17:01     Performed at [place]     Final   Value: >=100,000 COLONIES/ML     Performed at [facility]   Culture     Final   Value: Multiple bacterial morphotypes present, none predominant. Suggest appropriate recollection if clinically indicated.     Performed at [facility]   Report Status 10/20/2013 FINAL   Final     Studies: No results found.  Scheduled Meds: . aspirin EC  81 mg Oral Daily  . carvedilol  6.25 mg Oral BID WC  . cefTRIAXone (ROCEPHIN)  IV  1 g Intravenous Q24H  . docusate sodium  100 mg Oral BID  . enoxaparin (LOVENOX) injection  30 mg Subcutaneous Q24H  . ezetimibe  10 mg Oral Daily  . insulin aspart  0-15 Units Subcutaneous TID WC  . insulin aspart  0-5 Units Subcutaneous QHS  . insulin glargine  25 Units Subcutaneous QHS  . isosorbide mononitrate  30 mg Oral Daily  . linagliptin  5 mg Oral Daily  . pantoprazole  40 mg Oral Daily  . sodium chloride  3 mL Intravenous Q12H  . vitamin B-12  1,000 mcg Oral Daily   Continuous Infusions: . sodium chloride 75 mL/hr at 10/21/13 1306    Time spent: 35 minutes  [facility] Pager [phone]. If 7PM-7AM, please contact night-coverage at [email or web address], password [facility] 10/21/2013, 1:25 PM  LOS: 2 days   [facility] PROGRESS NOTE  Colin [name] [id number] DOB: 11/03/1928 DOA:  10/19/2013 PCP: Horatio Pel, MD Chart reviewed  Assessment/Plan:  Principal Problem:  Fever resolved. Had cough and congestion with negative CXR, negative UA. Flu swab negative. 1/2 bc growing GPC pairs. Continue empiric rocephin for now. Also azithro for now. Check urine pneumococcal ag Active Problems:   CORONARY ARTERY DISEASE   Cardiac pacemaker in situ   Chronic systolic CHF (congestive heart failure): hypovolemic. Continue gentle IVF   Peripheral vascular disease, unspecified   CKD (chronic kidney disease), stage III. Baseline creat around 2, but BUN higher than previous, likely volume depletion   Falls at home, multiple. PT eval pending. Per RN, wife requesting SNF if qualifies. She can't care for him.   Type 2 diabetes, uncontrolled, with renal manifestation: CBGs improved   Weakness generalized protien calorie malnutrition troponins trending down and no CP. Likely from renal failure.  Code Status:  full Family Communication:   Disposition Plan:  Possibly SNF  Consultants:    Procedures:     Antibiotics:  Rocephin azithro. 10/20/13  HPI/Subjective: Some cough. Has not been OOB. Some right rib pain and head pain from fall.  Objective: Filed Vitals:   10/21/13 1145  BP: 145/53  Pulse: 64  Temp: 98.4 F (36.9 C)  Resp: 16    Intake/Output Summary (Last 24 hours) at 10/21/13 1326 Last data filed at 10/21/13 0540  Gross per 24 hour  Intake    470 ml  Output      0 ml  Net    470 ml   Filed Weights   10/19/13 1155 10/19/13 2100 10/20/13 2120  Weight: 67.7 kg (149 lb 4 oz) 68.7 kg (151 lb 7.3 oz) 68.7 kg (151 lb 7.3 oz)    Exam:   General:  Frail, elderly. Oriented  HEENT: normocephalic, atraumatic  Cardiovascular: RRR without MGR  Respiratory: CTA without WRR  Abdomen: s, nt, nd  Ext: no CCE  Basic Metabolic Panel:  Recent Labs Lab 10/19/13 0651 10/19/13 1200 10/20/13 0532 10/21/13 0554  NA 135*  --  135* 139  K 5.2  --   4.9 5.2  CL 98  --  104 109  CO2 21  --  21 20  GLUCOSE 214*  --  167* 108*  BUN 83*  --  68* 49*  CREATININE 2.75* 2.51* 2.15* 1.80*  CALCIUM 10.1  --  9.1 8.7   Liver Function Tests:  Recent Labs Lab 10/19/13 0651 10/20/13 0532  AST 24 20  ALT 18 14  ALKPHOS 84 65  BILITOT 0.7 0.3  PROT 9.4* 6.9  ALBUMIN 2.9* 2.2*   No results found for this basename: LIPASE, AMYLASE,  in the last 168 hours No results found for this basename: AMMONIA,  in the last 168 hours CBC:  Recent Labs Lab 10/19/13 0651 10/19/13 1200 10/20/13 0532 10/21/13 0554  WBC 12.0* 17.6* 11.9* 9.0  NEUTROABS 9.8*  --   --  6.3  HGB 10.9* 9.1* 9.0* 9.0*  HCT 34.5* 28.6* 28.7* 28.3*  MCV 77.5* 76.3* 76.5* 76.3*  PLT 250 205 216 218   Cardiac Enzymes:  Recent Labs Lab 10/19/13 1200 10/19/13 1707 10/19/13 2208  TROPONINI 0.59* 0.39* <0.30   BNP (last 3 results)  Recent Labs  10/19/13 1200  PROBNP 2214.0*   CBG:  Recent Labs Lab 10/20/13 1053 10/20/13 1640 10/20/13 2117 10/21/13 0734 10/21/13 1142  GLUCAP 231* 232* 136* 115* 126*    Recent Results (from the past 240 hour(s))  CULTURE, BLOOD (ROUTINE X 2)     Status: None   Collection Time    10/19/13  6:52  AM      Result Value Ref Range Status   Specimen Description BLOOD RIGHT ARM   Final   Special Requests BOTTLES DRAWN AEROBIC AND ANAEROBIC 5CC EA   Final   Culture  Setup Time     Final   Value: 10/19/2013 13:29     Performed at [facility]   Culture     Final   Value: GRAM POSITIVE COCCI IN PAIRS     Note: Gram Stain Report Called to,Read Back By and Verified With: JAY NARAMDAS @ 1325 ON [id number] BY [facility]     Performed at [facility]   Report Status PENDING   Incomplete  CULTURE, BLOOD (ROUTINE X 2)     Status: None   Collection Time    10/19/13  6:58 AM      Result Value Ref Range Status   Specimen Description BLOOD LEFT ARM   Final   Special Requests BOTTLES DRAWN AEROBIC ONLY 2CC   Final   Culture   Setup Time     Final   Value: 10/19/2013 13:29     Performed at [facility]   Culture     Final   Value: GRAM POSITIVE COCCI IN PAIRS AND CHAINS     Note: Gram Stain Report Called to,Read Back By and Verified With: KAMI MOORE ON 10/20/2013 AT 10:25P BY WILEJ     Performed at [facility]   Report Status PENDING   Incomplete  URINE CULTURE     Status: None   Collection Time    10/19/13  9:12 AM      Result Value Ref Range Status   Specimen Description URINE, RANDOM   Final   Special Requests NONE   Final   Culture  Setup Time     Final   Value: 10/19/2013 17:01     Performed at [place]     Final   Value: >=100,000 COLONIES/ML     Performed at [facility]   Culture     Final   Value: Multiple bacterial morphotypes present, none predominant. Suggest appropriate recollection if clinically indicated.     Performed at [facility]   Report Status 10/20/2013 FINAL   Final     Studies: No results found.  Scheduled Meds: . aspirin EC  81 mg Oral Daily  . carvedilol  6.25 mg Oral BID WC  . cefTRIAXone (ROCEPHIN)  IV  1 g Intravenous Q24H  . docusate sodium  100 mg Oral BID  . enoxaparin (LOVENOX) injection  30 mg Subcutaneous Q24H  . ezetimibe  10 mg Oral Daily  . insulin aspart  0-15 Units Subcutaneous TID WC  . insulin aspart  0-5 Units Subcutaneous QHS  . insulin glargine  25 Units Subcutaneous QHS  . isosorbide mononitrate  30 mg Oral Daily  . linagliptin  5 mg Oral Daily  . pantoprazole  40 mg Oral Daily  . sodium chloride  3 mL Intravenous Q12H  . vitamin B-12  1,000 mcg Oral Daily   Continuous Infusions: . sodium chloride 75 mL/hr at 10/21/13 1306    Time spent: 35 minutes  [facility] Pager [phone]. If 7PM-7AM, please contact night-coverage at [email or web address], password [facility] 10/21/2013, 1:26 PM  LOS: 2 days

## 2013-10-21 NOTE — Clinical Documentation Improvement (Signed)
  ED Note states CKD III with "acute renal insufficiency" and PN states "renal failure". Please clarify "renal failure" with acuity to better reflect severity of illness and risk of mortality.   Possible Clinical Conditions?  - Acute Renal Failure - Acute Kidney Injury - Acute Tubular Necrosis - Acute on Chronic Renal Failure - Other Condition  Supporting Information: - BUN/Cr: 83/2.75 - GFR: 20/23 - IV NS 2000 ml/hr 9/27  Thank You, Ezekiel Ina ,RN Clinical Documentation Specialist:  [phone]  [place] Information Management

## 2013-10-21 NOTE — Progress Notes (Signed)
Physical Therapy Treatment Patient Details Name: Colin [name] MRN: [id number] DOB: 1928/10/17 Today's Date: 10/21/2013    History of Present Illness 80 year old male with history of CAD,PVD, COPD, history of CVA, history of CHF with EF of 30%, who presents with a fever and falls. Apparently the patient has had intermittent low-grade fever, weakness, poor oral intake, myalgia, headache and neck pain for about one to 2 weeks. Because of the above symptoms the patient has gradually had difficulty ambulating. Apparently he has hit his head multiple times but has not lost consciousness. Patient with a temp of 101.5,  clinically dehydrated, WBC count of 12,000.     PT Comments    Working well with PT, and noted better balance and activity tolerance with use of his custom prosthetic shoes; RN informed me that Mr. Passanisi wife is very concerned about her ability to care for Mr. 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 Required Braces or Orthoses: Other Brace/Splint Other Brace/Splint: Used pt's prosthetic shoes today    Mobility  Bed Mobility Overal bed mobility: Needs Assistance Bed Mobility: Supine to Sit     Supine to sit: Min assist     General bed mobility comments: pulled up with handheld assist  Transfers Overall transfer level: Needs assistance Equipment used: Rolling walker (2 wheeled) Transfers: Sit to/from Stand Sit to Stand: Min assist         General transfer comment: Min assist to steady pt and RW; cues for safety and  hand placement; noted good control of descent to chair  Ambulation/Gait Ambulation/Gait assistance: Min guard;Min assist Ambulation Distance (Feet): 110 Feet Assistive device: Rolling walker (2 wheeled) Gait Pattern/deviations: Decreased stride length Gait velocity: quite slow   General Gait Details: Better steps today with pt's prosthetic shoes; still requiring close guard for steadiness/balance   Stairs            Wheelchair Mobility    Modified Rankin (Stroke Patients Only)       Balance           Standing balance support: Bilateral upper extremity supported Standing balance-Leahy Scale: Poor                      Cognition Arousal/Alertness: Awake/alert Behavior During Therapy: WFL for tasks assessed/performed Overall Cognitive Status: History of cognitive impairments - at baseline       Memory: Decreased short-term memory              Exercises      General Comments        Pertinent Vitals/Pain Pain Assessment: 0-10 Pain Score: 4  Pain Location: R lower ribs Pain Descriptors / Indicators: Sharp (with deep breathing and cough) Pain Intervention(s): Monitored during session;Other (comment) (Instructed pt in splinting with pillow)    Home Living                      Prior Function            PT Goals (current goals can now be found in  the care plan section) Acute Rehab PT Goals PT Goal Formulation: With patient Time For Goal Achievement: 11/03/13 Potential to Achieve Goals: Good Progress towards PT goals: Progressing toward goals    Frequency  Min 3X/week    PT Plan Discharge plan needs to be updated    Co-evaluation             End of Session Equipment Utilized During Treatment: Gait belt Activity Tolerance: Patient tolerated treatment well Patient left: in chair;with call bell/phone within reach;with chair alarm set     Time: 1432-1450 PT Time Calculation (min): 18 min  Charges:  $Gait Training:  8-22 mins                    G Codes:      Quin Hoop 10/21/2013, 4:20 PM  Roney Marion, [place] Pager [phone] Office [phone]

## 2013-10-22 DIAGNOSIS — B952 Enterococcus as the cause of diseases classified elsewhere: Secondary | ICD-10-CM

## 2013-10-22 DIAGNOSIS — Z950 Presence of cardiac pacemaker: Secondary | ICD-10-CM

## 2013-10-22 DIAGNOSIS — I739 Peripheral vascular disease, unspecified: Secondary | ICD-10-CM

## 2013-10-22 DIAGNOSIS — I798 Other disorders of arteries, arterioles and capillaries in diseases classified elsewhere: Secondary | ICD-10-CM

## 2013-10-22 DIAGNOSIS — E1159 Type 2 diabetes mellitus with other circulatory complications: Secondary | ICD-10-CM

## 2013-10-22 LAB — GLUCOSE, CAPILLARY
GLUCOSE-CAPILLARY: 148 mg/dL — AB (ref 70–99)
Glucose-Capillary: 71 mg/dL (ref 70–99)
Glucose-Capillary: 160 mg/dL — ABNORMAL HIGH (ref 70–99)
Glucose-Capillary: 189 mg/dL — ABNORMAL HIGH (ref 70–99)

## 2013-10-22 MED ORDER — VANCOMYCIN HCL IN DEXTROSE 1-5 GM/200ML-% IV SOLN
1000.0000 mg | INTRAVENOUS | Status: DC
  Administered 2013-10-22 – 2013-10-23 (×2): 1000 mg via INTRAVENOUS
  Filled 2013-10-22 (×3): qty 200

## 2013-10-22 MED ORDER — CEFTRIAXONE SODIUM 1 G IJ SOLR
1.0000 g | INTRAMUSCULAR | Status: DC
  Administered 2013-10-23: 1 g via INTRAVENOUS
  Filled 2013-10-22 (×2): qty 10

## 2013-10-22 MED ORDER — SODIUM CHLORIDE 0.9 % IV SOLN
INTRAVENOUS | Status: DC
  Administered 2013-10-22 – 2013-10-25 (×3): via INTRAVENOUS
  Administered 2013-10-25 – 2013-10-27 (×2): 20 mL/h via INTRAVENOUS

## 2013-10-22 NOTE — Progress Notes (Signed)
ANTIBIOTIC CONSULT NOTE - INITIAL  Pharmacy Consult for vancomycin Indication: bacteremia  Allergies  Allergen Reactions  . Lisinopril Other (See Comments)    REACTION:  Elevated potassium,renal insuf    Patient Measurements: Height: 5\' 9"  (175.3 cm) Weight: 151 lb 7.3 oz (68.7 kg) IBW/kg (Calculated) : 70.7 Adjusted Body Weight:   Vital Signs: Temp: 99 F (37.2 C) (09/30 1034) Temp src: Oral (09/30 1034) BP: 126/44 mmHg (09/30 1034) Pulse Rate: 63 (09/30 1034) Intake/Output from previous day: 09/29 0701 - 09/30 0700 In: 960 [P.O.:960] Out: 1500 [Urine:1500] Intake/Output from this shift: Total I/O In: 240 [P.O.:240] Out: 500 [Urine:500]  Labs:  Recent Labs  10/20/13 0532 10/21/13 0554  WBC 11.9* 9.0  HGB 9.0* 9.0*  PLT 216 218  CREATININE 2.15* 1.80*   Medical History: Past Medical History  Diagnosis Date  . Coronary artery disease     Status post CABG in 2004  . Peripheral vascular disease     Status post right femoropopliteal bypass  . Dyslipidemia   . Diabetes mellitus with peripheral artery disease   . Hypertension   . Chronic renal insufficiency     creatinine around 2.4  . CHF (congestive heart failure) 2007    ef 38%  . History of CVA (cerebrovascular accident)   . Cardiomyopathy   . COPD (chronic obstructive pulmonary disease)   . History of anemia   . Myocardial infarction   . Shortness of breath   . Pacemaker   . Pneumonia   . Anginal pain   . Dementia   . GERD (gastroesophageal reflux disease)   . Arthritis     Medications:  See EMR  Assessment: 80 yo male with CKD Stage III found to have GPC in 2/2 blood cultures.  He was placed on ceftriaxone as they were growing in pairs and chains; however, today the cultures resulted as enterococcus.  Antibiotic therapy was subsequently changed to vancomycin.  Pt is no longer febrile, WBC have greatly improved from 17 > 9, lactic acid was negative.  Additionally, his UA was unimpressive but  his urine culture grew 100K colonies, with no predominant organism.  Current SCr is 1.8 with eCrCl ~30 ml/min.   Azithromycin 9/27 > 9/28 Ceftriaxone 9/27 > 9/30 Ampicillin 9/29 x1 Vancomycin 9/30 >   Goal of Therapy:  Vancomycin trough level 15-20 mcg/ml  Plan:  -Vancomycin 1g/24h -F/u cultures and sensitivities -Check trough at steady state -Monitor renal function    Hughes Better, PharmD, BCPS Clinical Pharmacist Pager: [phone] 10/22/2013 1:48 PM

## 2013-10-22 NOTE — Progress Notes (Signed)
[facility] PROGRESS NOTE  Jaloni Kettinger Sliter [id number] DOB: 1928-07-31 DOA: 10/19/2013 PCP: Horatio Pel, MD Chart reviewed  Assessment/Plan:  Principal Problem: Bacteremia: 2/2 BC growing enterococcus sp. No sensitivities back.  UA negative and Urine cx multiple bacterial morphotypes.  Echo shows Thickened tricuspid valve with mild tricuspid regurgitation . No obvious vegetations noted shadowing in region of tricuspid valve likely secondary to device wire. Will consult cardiology for TEE, as no definite source. Change abx to vanc pending sensitivities. Repeat BC. Paged ID to discuss Active Problems:   CORONARY ARTERY DISEASE   Cardiac pacemaker in situ   Chronic systolic CHF (congestive heart failure): compensated   Peripheral vascular disease, unspecified  acute renal failue with CKD (chronic kidney disease), stage III. BUN down. Creat down. Saline lock   Falls at home, multiple. PT eval pending. Home with PT v. SNF   Type 2 diabetes, uncontrolled, with renal manifestation: adjust insulin   Weakness generalized protien calorie malnutrition troponins trending down and no CP. Likely from renal failure.  Code Status:  full Family Communication:   Disposition Plan:  Eventual SNF v. Home with PT  Consultants:    Procedures:     Antibiotics:  Rocephin  10/20/13 - 9/30  Vancomycin 9/30  HPI/Subjective: Feeling better. No dyspnea. Cough and congestion improved  Objective: Filed Vitals:   10/22/13 1034  BP: 126/44  Pulse: 63  Temp: 99 F (37.2 C)  Resp: 17    Intake/Output Summary (Last 24 hours) at 10/22/13 1320 Last data filed at 10/22/13 0900  Gross per 24 hour  Intake    720 ml  Output   1600 ml  Net   -880 ml   Filed Weights   10/19/13 2100 10/20/13 2120 10/21/13 2113  Weight: 68.7 kg (151 lb 7.3 oz) 68.7 kg (151 lb 7.3 oz) 68.7 kg (151 lb 7.3 oz)    Exam:   General:  Frail, elderly. comfortable  HEENT: normocephalic,  atraumatic  Cardiovascular: RRR without MGR  Respiratory: CTA without WRR  Abdomen: s, nt, nd  Ext: no CCE  Basic Metabolic Panel:  Recent Labs Lab 10/19/13 0651 10/19/13 1200 10/20/13 0532 10/21/13 0554  NA 135*  --  135* 139  K 5.2  --  4.9 5.2  CL 98  --  104 109  CO2 21  --  21 20  GLUCOSE 214*  --  167* 108*  BUN 83*  --  68* 49*  CREATININE 2.75* 2.51* 2.15* 1.80*  CALCIUM 10.1  --  9.1 8.7   Liver Function Tests:  Recent Labs Lab 10/19/13 0651 10/20/13 0532  AST 24 20  ALT 18 14  ALKPHOS 84 65  BILITOT 0.7 0.3  PROT 9.4* 6.9  ALBUMIN 2.9* 2.2*   No results found for this basename: LIPASE, AMYLASE,  in the last 168 hours No results found for this basename: AMMONIA,  in the last 168 hours CBC:  Recent Labs Lab 10/19/13 0651 10/19/13 1200 10/20/13 0532 10/21/13 0554  WBC 12.0* 17.6* 11.9* 9.0  NEUTROABS 9.8*  --   --  6.3  HGB 10.9* 9.1* 9.0* 9.0*  HCT 34.5* 28.6* 28.7* 28.3*  MCV 77.5* 76.3* 76.5* 76.3*  PLT 250 205 216 218   Cardiac Enzymes:  Recent Labs Lab 10/19/13 1200 10/19/13 1707 10/19/13 2208  TROPONINI 0.59* 0.39* <0.30   BNP (last 3 results)  Recent Labs  10/19/13 1200  PROBNP 2214.0*   CBG:  Recent Labs Lab 10/21/13 1142 10/21/13 1713 10/21/13 2116 10/22/13  0813 10/22/13 1152  GLUCAP 126* 217* 135* 71 160*    Recent Results (from the past 240 hour(s))  CULTURE, BLOOD (ROUTINE X 2)     Status: None   Collection Time    10/19/13  6:52 AM      Result Value Ref Range Status   Specimen Description BLOOD RIGHT ARM   Final   Special Requests BOTTLES DRAWN AEROBIC AND ANAEROBIC 5CC EA   Final   Culture  Setup Time     Final   Value: 10/19/2013 13:29     Performed at [facility]   Culture     Final   Value: ENTEROCOCCUS SPECIES     Note: Gram Stain Report Called to,Read Back By and Verified With: JAY NARAMDAS @ [place] ON [id number] BY [facility]     Performed at [facility]   Report Status PENDING    Incomplete  CULTURE, BLOOD (ROUTINE X 2)     Status: None   Collection Time    10/19/13  6:58 AM      Result Value Ref Range Status   Specimen Description BLOOD LEFT ARM   Final   Special Requests BOTTLES DRAWN AEROBIC ONLY 2CC   Final   Culture  Setup Time     Final   Value: 10/19/2013 13:29     Performed at [facility]   Culture     Final   Value: ENTEROCOCCUS SPECIES     Note: Gram Stain Report Called to,Read Back By and Verified With: KAMI MOORE ON 10/20/2013 AT 10:25P BY WILEJ     Performed at [facility]   Report Status PENDING   Incomplete  URINE CULTURE     Status: None   Collection Time    10/19/13  9:12 AM      Result Value Ref Range Status   Specimen Description URINE, RANDOM   Final   Special Requests NONE   Final   Culture  Setup Time     Final   Value: 10/19/2013 17:01     Performed at [place]     Final   Value: >=100,000 COLONIES/ML     Performed at [facility]   Culture     Final   Value: Multiple bacterial morphotypes present, none predominant. Suggest appropriate recollection if clinically indicated.     Performed at [facility]   Report Status 10/20/2013 FINAL   Final     Studies: No results found. Echo Left ventricle: The cavity size was normal. Wall thickness was increased in a pattern of mild LVH. Systolic function was moderately to severely reduced. The estimated ejection fraction was in the range of 30% to 35%. Diffuse hypokinesis. Doppler parameters are consistent with abnormal left ventricular relaxation (grade 1 diastolic dysfunction). - Ventricular septum: Septal motion showed abnormal function and dyssynergy. - Aortic valve: Trileaflet; moderately calcified leaflets. There was trivial regurgitation. - Mitral valve: There was trivial regurgitation. - Left atrium: The atrium was moderately dilated. - Right ventricle: Pacer wire or catheter noted in right ventricle. Systolic  function was reduced. - Right atrium: The atrium was mildly dilated. Pacer wire or catheter noted in right atrium. Central venous pressure (est): 15 mm Hg. - Atrial septum: No defect or patent foramen ovale was identified. - Tricuspid valve: Mildly thickened leaflets. There was mild regurgitation. - Pulmonary arteries: PA peak pressure: 39 mm Hg (S). - Pericardium, extracardiac: There was no pericardial effusion.  Impressions:  - Mild LVH with LVEF approximately 30-35%, septal dyssynergy, grade 1 diastolic dysfunction. Sclerotic aortic valve with trivial aortic regurgitation. Device wire noted in right heart. Reduced RV contraction. Thickened tricuspid valve with mild tricuspid regurgitation and PASP 39 mmHg. No obvious vegetations noted - shadowing in region of tricuspid valve likely secondary to device wire. If high suspicion of endocarditis, TEE could be considered.   Scheduled Meds: . aspirin EC  81 mg Oral Daily  . carvedilol  6.25 mg Oral BID WC  . docusate sodium  100 mg Oral BID  . enoxaparin (LOVENOX) injection  30 mg Subcutaneous Q24H  . ezetimibe  10 mg Oral Daily  . insulin aspart  0-15 Units Subcutaneous TID WC  . insulin aspart  0-5 Units Subcutaneous QHS  . insulin glargine  25 Units Subcutaneous QHS  . isosorbide mononitrate  30 mg Oral Daily  . linagliptin  5 mg Oral Daily  . pantoprazole  40 mg Oral Daily  . sodium chloride  3 mL Intravenous Q12H  . vitamin B-12  1,000 mcg Oral Daily   Continuous Infusions:    Time spent: 35 minutes  [facility] Pager [phone]. If 7PM-7AM, please contact night-coverage at [email or web address], password [facility] 10/22/2013, 1:20 PM  LOS: 3 days

## 2013-10-22 NOTE — Care Management Note (Signed)
CARE MANAGEMENT NOTE 10/22/2013  Patient:  Colin [name], Colin [name]   Account Number:  [id number]  Date Initiated:  10/22/2013  Documentation initiated by:  Kats,Isaiah Torok  Subjective/Objective Assessment:   CM following for progression and d/c planning.     Action/Plan:   10/22/2013 Met with pt and IM given , pt alert, oriented. Will continue to follow.   Anticipated DC Date:     Anticipated DC Plan:  SKILLED NURSING FACILITY         Choice offered to / List presented to:             Status of service:   Medicare Important Message given?  YES (If response is "NO", the following Medicare IM given date fields will be blank) Date Medicare IM given:  10/22/2013 Medicare IM given by:  Sanderford,Merideth Bosque Date Additional Medicare IM given:   Additional Medicare IM given by:    Discharge Disposition:    Per UR Regulation:    If discussed at Long Length of Stay Meetings, dates discussed:    Comments:

## 2013-10-22 NOTE — Consult Note (Signed)
Colin [name] for Infectious Disease  Date of Admission:  10/19/2013  Date of Consult:  10/22/2013  Reason for Consult: Enterococcal bacteremia Referring Physician: Lewie Loron  Impression/Recommendation Enterococcal bacteremia Pacemaker (2007) CKD 3 DM CAD/CABG/CHF Constipation Dementia?  Would Continue vanco Continue ceftriaxone Await sensi Await TEE Repeat BCx Bowel hygiene  Comment- Typically for enterococcal endocarditis, amp and gent (or vanco/gent) would be used. Given his comorbidities, his age, and his Cr, I would be hesitant to give him gent. Studies have shown that ceftriaxone in combination with other active agent, is an effective rx for entoeroccous.  Explained to pt and his friend, that he would be unlikely to be a candidate for removal of the pacer if it was shown to be infected.   Thank you so much for this interesting consult,   [name] (pager) [phone] [email or web address]  Colin [name] is an 80 y.o. male.  HPI: 80 yo M with hx of DM (> 20 yrs), CAD/CABG (04), pacemaker, PVD (R gangrenous toe removed, Fem-Pop April 2015), CHF (EF 30%), comes to hospital on 9-27 with fever and falls at home. He had fever for ~ 2 weeks PTA. In ED he had temp 101.5, WBC 12k --> 17.6k. He was started on ceftriaxone/azithro on admission due to concern for CAP (CXR -).  By 9-29 he was found to have [place] in pairs and chains in 2/2 BCx from admission. Now found to be enterococcus.   Past Medical History  Diagnosis Date  . Coronary artery disease     Status post CABG in 2004  . Peripheral vascular disease     Status post right femoropopliteal bypass  . Dyslipidemia   . Diabetes mellitus with peripheral artery disease   . Hypertension   . Chronic renal insufficiency     creatinine around 2.4  . CHF (congestive heart failure) 2007    ef 38%  . History of CVA (cerebrovascular accident)   . Cardiomyopathy   . COPD (chronic obstructive pulmonary  disease)   . History of anemia   . Myocardial infarction   . Shortness of breath   . Pacemaker   . Pneumonia   . Anginal pain   . Dementia   . GERD (gastroesophageal reflux disease)   . Arthritis     Past Surgical History  Procedure Laterality Date  . Insert / replace / remove pacemaker  05/17/2005    [facility] dual chamber ddd mode   . Coronary artery bypass graft  4 of 2004    4 vessel bypass  . Femoral bypass  july 2006    right with right great toe amputation  . Cardiac catheterization  04/29/2002    Dr. Glade Lloyd  . Esophagogastroduodenoscopy  07/21/2011    Procedure: ESOPHAGOGASTRODUODENOSCOPY (EGD);  Surgeon: Beryle Beams, MD;  Location: [facility];  Service: Endoscopy;  Laterality: N/A;  . Coronary angioplasty    . Femoral-popliteal bypass graft Left 05/01/2013    Procedure: LEFT FEMORAL TO ABOVE KNEE POPLITEAL ARTERY BYPASS GRAFT WITH INTRAOPERATIVE ARTEROGRAM;  Surgeon: Mal Misty, MD;  Location: [facility];  Service: Vascular;  Laterality: Left;  . Amputation Left 05/05/2013    Procedure: AMPUTATION RAY- FIRST AND SECOND-LEFT FOOT;  Surgeon: Newt Minion, MD;  Location: [facility];  Service: Orthopedics;  Laterality: Left;     Allergies  Allergen Reactions  . Lisinopril Other (See Comments)    REACTION:  Elevated potassium,renal insuf    Medications:  Scheduled: . aspirin EC  81  mg Oral Daily  . carvedilol  6.25 mg Oral BID WC  . docusate sodium  100 mg Oral BID  . enoxaparin (LOVENOX) injection  30 mg Subcutaneous Q24H  . ezetimibe  10 mg Oral Daily  . insulin aspart  0-15 Units Subcutaneous TID WC  . insulin aspart  0-5 Units Subcutaneous QHS  . insulin glargine  25 Units Subcutaneous QHS  . isosorbide mononitrate  30 mg Oral Daily  . linagliptin  5 mg Oral Daily  . pantoprazole  40 mg Oral Daily  . sodium chloride  3 mL Intravenous Q12H  . vancomycin  1,000 mg Intravenous Q24H  . vitamin B-12  1,000 mcg Oral Daily    Abtx:  Anti-infectives   Start      Dose/Rate Route Frequency Ordered Stop   10/22/13 1430  vancomycin (VANCOCIN) IVPB 1000 mg/200 mL premix     1,000 mg 200 mL/hr over 60 Minutes Intravenous Every 24 hours 10/22/13 1347     10/21/13 0400  ampicillin (OMNIPEN) 1 g in sodium chloride 0.9 % 50 mL IVPB  Status:  Discontinued     1 g 150 mL/hr over 20 Minutes Intravenous 3 times per day 10/21/13 0220 10/21/13 0959   10/20/13 1030  azithromycin (ZITHROMAX) 500 mg in dextrose 5 % 250 mL IVPB  Status:  Discontinued     500 mg 250 mL/hr over 60 Minutes Intravenous Every 24 hours 10/19/13 1136 10/21/13 0224   10/20/13 1000  cefTRIAXone (ROCEPHIN) 1 g in dextrose 5 % 50 mL IVPB  Status:  Discontinued     1 g 100 mL/hr over 30 Minutes Intravenous Every 24 hours 10/19/13 1136 10/22/13 1301   10/20/13 0900  cefTRIAXone (ROCEPHIN) 1 g in dextrose 5 % 50 mL IVPB  Status:  Discontinued     1 g 100 mL/hr over 30 Minutes Intravenous Every 24 hours 10/19/13 0732 10/19/13 0813   10/19/13 1045  cefTRIAXone (ROCEPHIN) 1 g in dextrose 5 % 50 mL IVPB  Status:  Discontinued     1 g 100 mL/hr over 30 Minutes Intravenous Every 24 hours 10/19/13 1030 10/19/13 1136   10/19/13 1030  azithromycin (ZITHROMAX) 500 mg in dextrose 5 % 250 mL IVPB  Status:  Discontinued     500 mg 250 mL/hr over 60 Minutes Intravenous Every 24 hours 10/19/13 1030 10/19/13 1136   10/19/13 0745  cefTRIAXone (ROCEPHIN) 2 g in dextrose 5 % 50 mL IVPB     2 g 100 mL/hr over 30 Minutes Intravenous  Once 10/19/13 0730 10/19/13 0910      Total days of antibiotics 9-30 vanco 9-27 ceftriaxone 9-30 9-27 azithro 9-29          Social History:  reports that he quit smoking about 11 years ago. His smoking use included Cigarettes. He has a 50 pack-year smoking history. He has never used smokeless tobacco. He reports that he does not drink alcohol or use illicit drugs.  Family History  Problem Relation Age of Onset  . Hyperlipidemia Mother   . Heart disease Father   . Diabetes      . Diabetes Sister   . Hypertension Sister   . Hyperlipidemia Sister     General ROS: eating well, poor vision, no CP, no SOB, occas cough, wound well healed from previous surgery, constipation, normal urination, see HPI  Blood pressure 126/44, pulse 63, temperature 99 F (37.2 C), temperature source Oral, resp. rate 17, height 5' 9"  (1.753 m), weight 68.7 kg (151  lb 7.3 oz), SpO2 100.00%. General appearance: alert, cooperative, no distress and mental status- place "that [facility]", date "last day of april 2013", president- unable to recall name without prompting.  Eyes: negative findings: pupils equal, round, reactive to light and accomodation Throat: normal findings: oropharynx pink & moist without lesions or evidence of thrush and abnormal findings: missing teeth Neck: no adenopathy and supple, symmetrical, trachea midline Lungs: clear to auscultation bilaterally Chest wall: no tenderness, R sided pacer is nontender, no fluctuance.  Abdomen: normal findings: bowel sounds normal and soft, non-tender Extremities: edema none and wounds well healed. no open diabetic foot ulcers. light touch grossly decreased bilaterally.    Results for orders placed during the hospital encounter of 10/19/13 (from the past 48 hour(s))  GLUCOSE, CAPILLARY     Status: Abnormal   Collection Time    10/20/13  9:17 PM      Result Value Ref Range   Glucose-Capillary 136 (*) 70 - 99 mg/dL  BASIC METABOLIC PANEL     Status: Abnormal   Collection Time    10/21/13  5:54 AM      Result Value Ref Range   Sodium 139  137 - 147 mEq/L   Potassium 5.2  3.7 - 5.3 mEq/L   Chloride 109  96 - 112 mEq/L   CO2 20  19 - 32 mEq/L   Glucose, Bld 108 (*) 70 - 99 mg/dL   BUN 49 (*) 6 - 23 mg/dL   Creatinine, Ser 1.80 (*) 0.50 - 1.35 mg/dL   Calcium 8.7  8.4 - 10.5 mg/dL   GFR calc non Af Amer 33 (*) >90 mL/min   GFR calc Af Amer 39 (*) >90 mL/min   Comment: (NOTE)     The eGFR has been calculated using the CKD  EPI equation.     This calculation has not been validated in all clinical situations.     eGFR's persistently <90 mL/min signify possible Chronic Kidney     Disease.   Anion gap 10  5 - 15  CBC WITH DIFFERENTIAL     Status: Abnormal   Collection Time    10/21/13  5:54 AM      Result Value Ref Range   WBC 9.0  4.0 - 10.5 K/uL   RBC 3.71 (*) 4.22 - 5.81 MIL/uL   Hemoglobin 9.0 (*) 13.0 - 17.0 g/dL   HCT 28.3 (*) 39.0 - 52.0 %   MCV 76.3 (*) 78.0 - 100.0 fL   MCH 24.3 (*) 26.0 - 34.0 pg   MCHC 31.8  30.0 - 36.0 g/dL   RDW 17.1 (*) 11.5 - 15.5 %   Platelets 218  150 - 400 K/uL   Neutrophils Relative % 70  43 - 77 %   Neutro Abs 6.3  1.7 - 7.7 K/uL   Lymphocytes Relative 19  12 - 46 %   Lymphs Abs 1.7  0.7 - 4.0 K/uL   Monocytes Relative 9  3 - 12 %   Monocytes Absolute 0.8  0.1 - 1.0 K/uL   Eosinophils Relative 2  0 - 5 %   Eosinophils Absolute 0.1  0.0 - 0.7 K/uL   Basophils Relative 0  0 - 1 %   Basophils Absolute 0.0  0.0 - 0.1 K/uL  GLUCOSE, CAPILLARY     Status: Abnormal   Collection Time    10/21/13  7:34 AM      Result Value Ref Range   Glucose-Capillary 115 (*) 70 -  99 mg/dL  STREP PNEUMONIAE URINARY ANTIGEN     Status: None   Collection Time    10/21/13 11:07 AM      Result Value Ref Range   Strep Pneumo Urinary Antigen NEGATIVE  NEGATIVE   Comment:            Infection due to S. pneumoniae     cannot be absolutely ruled out     since the antigen present     may be below the detection limit     of the test.  GLUCOSE, CAPILLARY     Status: Abnormal   Collection Time    10/21/13 11:42 AM      Result Value Ref Range   Glucose-Capillary 126 (*) 70 - 99 mg/dL  GLUCOSE, CAPILLARY     Status: Abnormal   Collection Time    10/21/13  5:13 PM      Result Value Ref Range   Glucose-Capillary 217 (*) 70 - 99 mg/dL  GLUCOSE, CAPILLARY     Status: Abnormal   Collection Time    10/21/13  9:16 PM      Result Value Ref Range   Glucose-Capillary 135 (*) 70 - 99 mg/dL    GLUCOSE, CAPILLARY     Status: None   Collection Time    10/22/13  8:13 AM      Result Value Ref Range   Glucose-Capillary 71  70 - 99 mg/dL  GLUCOSE, CAPILLARY     Status: Abnormal   Collection Time    10/22/13 11:52 AM      Result Value Ref Range   Glucose-Capillary 160 (*) 70 - 99 mg/dL  GLUCOSE, CAPILLARY     Status: Abnormal   Collection Time    10/22/13  4:45 PM      Result Value Ref Range   Glucose-Capillary 148 (*) 70 - 99 mg/dL      Component Value Date/Time   SDES URINE, RANDOM 10/19/2013 0912   SPECREQUEST NONE 10/19/2013 0912   CULT  Value: Multiple bacterial morphotypes present, none predominant. Suggest appropriate recollection if clinically indicated. Performed at [facility] 10/19/2013 0912   REPTSTATUS 10/20/2013 FINAL 10/19/2013 0912   No results found. Recent Results (from the past 240 hour(s))  CULTURE, BLOOD (ROUTINE X 2)     Status: None   Collection Time    10/19/13  6:52 AM      Result Value Ref Range Status   Specimen Description BLOOD RIGHT ARM   Final   Special Requests BOTTLES DRAWN AEROBIC AND ANAEROBIC 5CC EA   Final   Culture  Setup Time     Final   Value: 10/19/2013 13:29     Performed at [facility]   Culture     Final   Value: ENTEROCOCCUS SPECIES     Note: Gram Stain Report Called to,Read Back By and Verified With: JAY NARAMDAS @ [id number] ON [id number] BY [facility]     Performed at [facility]   Report Status PENDING   Incomplete  CULTURE, BLOOD (ROUTINE X 2)     Status: None   Collection Time    10/19/13  6:58 AM      Result Value Ref Range Status   Specimen Description BLOOD LEFT ARM   Final   Special Requests BOTTLES DRAWN AEROBIC ONLY 2CC   Final   Culture  Setup Time     Final   Value: 10/19/2013 13:29     Performed at [facility]   Culture     Final   Value: ENTEROCOCCUS SPECIES     Note: Gram Stain Report Called to,Read Back By and Verified With: KAMI MOORE ON 10/20/2013 AT 10:25P BY WILEJ     Performed  at [facility]   Report Status PENDING   Incomplete  URINE CULTURE     Status: None   Collection Time    10/19/13  9:12 AM      Result Value Ref Range Status   Specimen Description URINE, RANDOM   Final   Special Requests NONE   Final   Culture  Setup Time     Final   Value: 10/19/2013 17:01     Performed at [facility]     Final   Value: >=100,000 COLONIES/ML     Performed at [facility]   Culture     Final   Value: Multiple bacterial morphotypes present, none predominant. Suggest appropriate recollection if clinically indicated.     Performed at [facility]   Report Status 10/20/2013 FINAL   Final      10/22/2013, 6:09 PM     LOS: 3 days

## 2013-10-22 NOTE — Progress Notes (Signed)
Occupational Therapy Treatment Patient Details Name: Colin [name] MRN: [id number] DOB: Jun 19, 1928 Today's Date: 10/22/2013    History of present illness [name]. Knupp is an 80 y.o. male admitted 10/19/13 with fever and falls at home. Pt reports low-grade fever, weakness, poor PO intake, myalgia, HA, and neck pain for 1-2 weeks on admission. PMH of CAD, PVD, hx CVA, CHF. Pt was febrile in ED.    OT comments  Pt seen today to address LB dressing and toilet transfers. Pt continues to require assistance and feel that pt would benefit from SNF at this time for increased independence prior to return home. Pt will continue to benefit from acute OT to increase strength, endurance, and safety in order to promote independence with ADLs.   Follow Up Recommendations  SNF;Supervision/Assistance - 24 hour    Equipment Recommendations  None recommended by OT    Recommendations for Other Services      Precautions / Restrictions Precautions Precautions: Fall Precaution Comments: right forefoot ambutation and left great and 2nd toe amputation Required Braces or Orthoses: Other Brace/Splint Other Brace/Splint: Used pt's prosthetic shoes today Restrictions Weight Bearing Restrictions: No       Mobility Bed Mobility Overal bed mobility: Needs Assistance Bed Mobility: Supine to Sit     Supine to sit: Min assist     General bed mobility comments: pulled up with handheld assist  Transfers Overall transfer level: Needs assistance Equipment used: Rolling walker (2 wheeled) Transfers: Sit to/from [facility] Sit to Stand: Min assist Stand pivot transfers: Min assist       General transfer comment: Min (A) to power up and steady in standing. Controlled descent to chair. Good hand placement.         ADL Overall ADL's : Needs assistance/impaired Eating/Feeding: Independent;Sitting   Grooming: Set up;Sitting               Lower Body Dressing: Sit to/from  stand;Set up (with min (A) sit<>stand)   Toilet Transfer: Stand-pivot;Minimal assistance                              Cognition  Arousal/Alertness: Awake/Alert Behavior During Therapy: WFL for tasks assessed/performed Overall Cognitive Status: History of cognitive impairments - at baseline       Memory: Decreased short-term memory                            Pertinent Vitals/ Pain       Pain Assessment: No/denies pain         Frequency Min 2X/week     Progress Toward Goals  OT Goals(current goals can now be found in the care plan section)  Progress towards OT goals: Progressing toward goals  Acute Rehab OT Goals Patient Stated Goal: to have a good meal ADL Goals Pt Will Perform Grooming: with set-up;with supervision;standing Pt Will Perform Lower Body Dressing: with set-up;with supervision;sit to/from stand Pt Will Transfer to Toilet: with supervision;ambulating;bedside commode Pt Will Perform Toileting - Clothing Manipulation and hygiene: with supervision;sit to/from stand  Plan Discharge plan needs to be updated       End of Session Equipment Utilized During Treatment: Gait belt;Rolling walker   Activity Tolerance Patient tolerated treatment well   Patient Left in chair;with call bell/phone within reach;with chair alarm set   Nurse Communication Other (comment) (pt in recliner for dinner)  Time: [id number] OT Time Calculation (min): 23 min  Charges: OT General Charges $OT Visit: 1 Procedure OT Treatments $Self Care/Home Management : 8-22 mins $Therapeutic Activity: 8-22 mins  [name] 10/22/2013, 6:09 PM  Cyndie Chime, OTR/L Occupational Therapist [phone] (pager)

## 2013-10-22 NOTE — Progress Notes (Signed)
    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  After careful review of history and examination, the risks and benefits of transesophageal echocardiogram have been explained including risks of esophageal damage, perforation (1:10,000 risk), bleeding, pharyngeal hematoma as well as other potential complications associated with conscious sedation including aspiration, arrhythmia, respiratory failure and death. Alternatives to treatment were discussed, questions were answered. Patient is willing to proceed. At the patient's request, his wife has also been informed about tomorrow's procedure which has been tentatively scheduled for 2PM by Dr. Meda Coffee  Of note, per patient, he had trouble swallowing pills years ago, however not recently. There is no diagnosis of previous esophageal stricture under EPIC. Risk has been discussed extensively with him and his wife, they understand risk and agree to proceed with procedure.    Colin [name], [place] 10/22/2013 4:24 PM

## 2013-10-23 ENCOUNTER — Encounter (HOSPITAL_COMMUNITY): Payer: Self-pay

## 2013-10-23 ENCOUNTER — Encounter (HOSPITAL_COMMUNITY)
Admission: EM | Disposition: A | Discharge status: Skilled Nursing Facility | Payer: Self-pay | Source: Home / Self Care | Attending: Internal Medicine

## 2013-10-23 DIAGNOSIS — E1159 Type 2 diabetes mellitus with other circulatory complications: Secondary | ICD-10-CM

## 2013-10-23 DIAGNOSIS — R7881 Bacteremia: Secondary | ICD-10-CM

## 2013-10-23 DIAGNOSIS — I079 Rheumatic tricuspid valve disease, unspecified: Secondary | ICD-10-CM

## 2013-10-23 DIAGNOSIS — I739 Peripheral vascular disease, unspecified: Secondary | ICD-10-CM

## 2013-10-23 DIAGNOSIS — N179 Acute kidney failure, unspecified: Secondary | ICD-10-CM

## 2013-10-23 DIAGNOSIS — R509 Fever, unspecified: Secondary | ICD-10-CM

## 2013-10-23 HISTORY — PX: TEE WITHOUT CARDIOVERSION: SHX5443

## 2013-10-23 LAB — CULTURE, BLOOD (ROUTINE X 2)

## 2013-10-23 LAB — CBC
HCT: 26.2 % — ABNORMAL LOW (ref 39.0–52.0)
Hemoglobin: 8.2 g/dL — ABNORMAL LOW (ref 13.0–17.0)
MCH: 24.8 pg — ABNORMAL LOW (ref 26.0–34.0)
MCHC: 31.3 g/dL (ref 30.0–36.0)
MCV: 79.2 fL (ref 78.0–100.0)
Platelets: 243 10*3/uL (ref 150–400)
RBC: 3.31 MIL/uL — ABNORMAL LOW (ref 4.22–5.81)
RDW: 17.3 % — AB (ref 11.5–15.5)
WBC: 5.9 10*3/uL (ref 4.0–10.5)

## 2013-10-23 LAB — GLUCOSE, CAPILLARY
GLUCOSE-CAPILLARY: 85 mg/dL (ref 70–99)
GLUCOSE-CAPILLARY: 73 mg/dL (ref 70–99)
GLUCOSE-CAPILLARY: 50 mg/dL — AB (ref 70–99)
Glucose-Capillary: 102 mg/dL — ABNORMAL HIGH (ref 70–99)
Glucose-Capillary: 97 mg/dL (ref 70–99)
Glucose-Capillary: 140 mg/dL — ABNORMAL HIGH (ref 70–99)
Glucose-Capillary: 236 mg/dL — ABNORMAL HIGH (ref 70–99)

## 2013-10-23 LAB — BASIC METABOLIC PANEL
ANION GAP: 9 (ref 5–15)
BUN: 31 mg/dL — ABNORMAL HIGH (ref 6–23)
CO2: 21 meq/L (ref 19–32)
CREATININE: 1.7 mg/dL — AB (ref 0.50–1.35)
Calcium: 8.9 mg/dL (ref 8.4–10.5)
Chloride: 108 mEq/L (ref 96–112)
GFR calc non Af Amer: 36 mL/min — ABNORMAL LOW (ref >90)
GFR, EST AFRICAN AMERICAN: 41 mL/min — AB (ref >90)
Glucose, Bld: 90 mg/dL (ref 70–99)
Potassium: 4.4 mEq/L (ref 3.7–5.3)
Sodium: 138 mEq/L (ref 137–147)

## 2013-10-23 SURGERY — ECHOCARDIOGRAM, TRANSESOPHAGEAL
Anesthesia: Moderate Sedation

## 2013-10-23 MED ORDER — DIPHENHYDRAMINE HCL 50 MG/ML IJ SOLN
INTRAMUSCULAR | Status: AC
  Filled 2013-10-23: qty 1

## 2013-10-23 MED ORDER — BUTAMBEN-TETRACAINE-BENZOCAINE 2-2-14 % EX AERO
INHALATION_SPRAY | CUTANEOUS | Status: DC | PRN
  Administered 2013-10-23: 2 via TOPICAL

## 2013-10-23 MED ORDER — DEXTROSE 50 % IV SOLN
INTRAVENOUS | Status: AC
  Filled 2013-10-23: qty 50

## 2013-10-23 MED ORDER — FENTANYL CITRATE 0.05 MG/ML IJ SOLN
INTRAMUSCULAR | Status: DC | PRN
  Administered 2013-10-23 (×2): 25 ug via INTRAVENOUS

## 2013-10-23 MED ORDER — FENTANYL CITRATE 0.05 MG/ML IJ SOLN
INTRAMUSCULAR | Status: AC
  Filled 2013-10-23: qty 2

## 2013-10-23 MED ORDER — MIDAZOLAM HCL 10 MG/2ML IJ SOLN
INTRAMUSCULAR | Status: DC | PRN
  Administered 2013-10-23 (×2): 2 mg via INTRAVENOUS

## 2013-10-23 MED ORDER — MIDAZOLAM HCL 5 MG/ML IJ SOLN
INTRAMUSCULAR | Status: AC
  Filled 2013-10-23: qty 2

## 2013-10-23 MED ORDER — AMPICILLIN SODIUM 2 G IJ SOLR
2.0000 g | Freq: Four times a day (QID) | INTRAMUSCULAR | Status: DC
  Administered 2013-10-23 – 2013-10-31 (×30): 2 g via INTRAVENOUS
  Filled 2013-10-23 (×39): qty 2000

## 2013-10-23 MED ORDER — DEXTROSE 50 % IV SOLN
25.0000 mL | Freq: Once | INTRAVENOUS | Status: AC
  Administered 2013-10-23: 25 mL via INTRAVENOUS

## 2013-10-23 NOTE — Clinical Social Work Psychosocial (Signed)
Clinical Social Work Department BRIEF PSYCHOSOCIAL ASSESSMENT 10/23/2013  Patient:  Colin [name], Colin [name]     Account Number:  [id number]     Admit date:  10/19/2013  Clinical Social Worker:  Frederico Hamman  Date/Time:  10/23/2013 02:59 AM  Referred by:  Physician  Date Referred:  10/21/2013 Referred for  SNF Placement   Other Referral:   Interview type:  Family Other interview type:    PSYCHOSOCIAL DATA Living Status:  WIFE Admitted from facility:   Level of care:   Primary support name:  [facility] Primary support relationship to patient:  SPOUSE Degree of support available:   Strong support from wife. Patient also has a son Colin [name] [phone]) that is involved in d/c planning. Per wife, they also have 2 daughters.    CURRENT CONCERNS Current Concerns  Post-Acute Placement   Other Concerns:    SOCIAL WORK ASSESSMENT / PLAN CSW initially talked by phone with patient's wife Colin [name] and then with their son Colin [name] regarding discharge planning. Per son, they are in agreement with rehab and want [facility]. Per family, patient has been to [place] before and this facility is their preference. CSW informed family that contact will be made with preferred facility to determine if they can accept patient for short-term rehab. Mrs. Folker also advised CSW that she would be talking with patient about going to [facility] re[facility].   Assessment/plan status:  Psychosocial Support/Ongoing Assessment of Needs Other assessment/ plan:   Information/referral to community resources:    PATIENT'S/FAMILY'S RESPONSE TO PLAN OF CARE: Patient's wife and son receptive to talking with CSW about discharge planning and want [facility].

## 2013-10-23 NOTE — Interval H&P Note (Signed)
History and Physical Interval Note:  10/23/2013 10:04 AM  Colin [name]  has presented today for surgery, with the diagnosis of POSSIBLE BLOOD CULTURES  The various methods of treatment have been discussed with the patient and family. After consideration of risks, benefits and other options for treatment, the patient has consented to  Procedure(s): TRANSESOPHAGEAL ECHOCARDIOGRAM (TEE) (N/A) as a surgical intervention .  The patient's history has been reviewed, patient examined, no change in status, stable for surgery.  I have reviewed the patient's chart and labs.  Questions were answered to the patient's satisfaction.     Dorothy Spark

## 2013-10-23 NOTE — CV Procedure (Signed)
     Transesophageal Echocardiogram Note  Colin [name] [id number] 04-07-1928  Procedure: Transesophageal Echocardiogram Indications: Enterococcal bacteremia  Procedure Details Consent: Obtained Time Out: Verified patient identification, verified procedure, site/side was marked, verified correct patient position, special equipment/implants available, Radiology Safety Procedures followed,  medications/allergies/relevent history reviewed, required imaging and test results available.  Performed  Medications: Fentanyl: 25 mg Versed: 2 mg  There is evidence of highly mobile 25 mm x 4 mm vegetation attached to the right atrial portion of the RV pacing lead. There is no evidence of tricuspid valve endocarditis, however can't be completely excluded. Mild TR.  For full report please see TEE read.  Complications: No apparent complications Patient did tolerate procedure well.

## 2013-10-23 NOTE — Progress Notes (Signed)
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   UA negative and Urine cx multiple bacterial morphotypes.  Echo shows Thickened tricuspid valve with mild tricuspid regurgitation . No obvious vegetations noted shadowing in region of tricuspid valve likely secondary to device wire.  -TEE 10/1 - vanc/ceftriaxone  -Repeat BC.  -ID consult    CORONARY ARTERY DISEASE   Cardiac pacemaker in situ   Chronic systolic CHF (congestive heart failure): compensated   Peripheral vascular disease, unspecified  acute renal failue with CKD (chronic kidney disease), stage III. BUN down. Creat down. Saline lock   Falls at home, multiple.- SNF?   Type 2 diabetes, uncontrolled, with renal manifestation: adjust insulin   Weakness generalized protien calorie malnutrition troponins trending down and no CP. Likely from renal failure.  Code Status:  full Family Communication:   Disposition Plan:  SNF?  Consultants:    Procedures:     Antibiotics:  Rocephin  10/20/13 - 9/30  Vancomycin 9/30  HPI/Subjective: Doing well overall- no SOB, no CP  Objective: Filed Vitals:   10/23/13 0602  BP: 137/56  Pulse: 62  Temp: 99.2 F (37.3 C)  Resp: 18    Intake/Output Summary (Last 24 hours) at 10/23/13 0808 Last data filed at 10/23/13 [id number]  Gross per 24 hour  Intake    960 ml  Output   1200 ml  Net   -240 ml   Filed Weights   10/20/13 2120 10/21/13 2113 10/22/13 2119  Weight: 68.7 kg (151 lb 7.3 oz) 68.7 kg (151 lb 7.3 oz) 68.7 kg (151 lb 7.3 oz)    Exam:   General:  Frail, elderly. comfortable  Cardiovascular: RRR without MGR  Respiratory: CTA without WRR  Abdomen: s, nt, nd  Ext: no CCE  Basic Metabolic Panel:  Recent Labs Lab 10/19/13 0651 10/19/13 1200 10/20/13 0532 10/21/13 0554  NA 135*  --  135* 139  K 5.2  --  4.9 5.2  CL 98  --  104  109  CO2 21  --  21 20  GLUCOSE 214*  --  167* 108*  BUN 83*  --  68* 49*  CREATININE 2.75* 2.51* 2.15* 1.80*  CALCIUM 10.1  --  9.1 8.7   Liver Function Tests:  Recent Labs Lab 10/19/13 0651 10/20/13 0532  AST 24 20  ALT 18 14  ALKPHOS 84 65  BILITOT 0.7 0.3  PROT 9.4* 6.9  ALBUMIN 2.9* 2.2*   No results found for this basename: LIPASE, AMYLASE,  in the last 168 hours No results found for this basename: AMMONIA,  in the last 168 hours CBC:  Recent Labs Lab 10/19/13 0651 10/19/13 1200 10/20/13 0532 10/21/13 0554  WBC 12.0* 17.6* 11.9* 9.0  NEUTROABS 9.8*  --   --  6.3  HGB 10.9* 9.1* 9.0* 9.0*  HCT 34.5* 28.6* 28.7* 28.3*  MCV 77.5* 76.3* 76.5* 76.3*  PLT 250 205 216 218   Cardiac Enzymes:  Recent Labs Lab 10/19/13 1200 10/19/13 1707 10/19/13 2208  TROPONINI 0.59* 0.39* <0.30   BNP (last 3 results)  Recent Labs  10/19/13 1200  PROBNP 2214.0*   CBG:  Recent Labs Lab 10/22/13 0813 10/22/13 1152 10/22/13 1645 10/22/13 2118 10/23/13 0738  GLUCAP 71 160* 148* 189* 85    Recent Results (from the past 240 hour(s))  CULTURE, BLOOD (ROUTINE X 2)     Status: None  Collection Time    10/19/13  6:52 AM      Result Value Ref Range Status   Specimen Description BLOOD RIGHT ARM   Final   Special Requests BOTTLES DRAWN AEROBIC AND ANAEROBIC 5CC EA   Final   Culture  Setup Time     Final   Value: 10/19/2013 13:29     Performed at [facility]   Culture     Final   Value: ENTEROCOCCUS SPECIES     Note: SUSCEPTIBILITIES PERFORMED ON PREVIOUS CULTURE WITHIN THE LAST 5 DAYS.     Note: Gram Stain Report Called to,Read Back By and Verified With: JAY NARAMDAS @ [id number] ON [id number] BY [facility]     Performed at [facility]   Report Status 10/23/2013 FINAL   Final  CULTURE, BLOOD (ROUTINE X 2)     Status: None   Collection Time    10/19/13  6:58 AM      Result Value Ref Range Status   Specimen Description BLOOD LEFT ARM   Final   Special Requests  BOTTLES DRAWN AEROBIC ONLY 2CC   Final   Culture  Setup Time     Final   Value: 10/19/2013 13:29     Performed at [facility]   Culture     Final   Value: ENTEROCOCCUS SPECIES     Note: Gram Stain Report Called to,Read Back By and Verified With: KAMI MOORE ON 10/20/2013 AT 10:25P BY WILEJ     Performed at [facility]   Report Status 10/23/2013 FINAL   Final   Organism ID, Bacteria ENTEROCOCCUS SPECIES   Final  URINE CULTURE     Status: None   Collection Time    10/19/13  9:12 AM      Result Value Ref Range Status   Specimen Description URINE, RANDOM   Final   Special Requests NONE   Final   Culture  Setup Time     Final   Value: 10/19/2013 17:01     Performed at [facility]     Final   Value: >=100,000 COLONIES/ML     Performed at [facility]   Culture     Final   Value: Multiple bacterial morphotypes present, none predominant. Suggest appropriate recollection if clinically indicated.     Performed at [facility]   Report Status 10/20/2013 FINAL   Final     Studies: No results found. Echo Left ventricle: The cavity size was normal. Wall thickness was increased in a pattern of mild LVH. Systolic function was moderately to severely reduced. The estimated ejection fraction was in the range of 30% to 35%. Diffuse hypokinesis. Doppler parameters are consistent with abnormal left ventricular relaxation (grade 1 diastolic dysfunction). - Ventricular septum: Septal motion showed abnormal function and dyssynergy. - Aortic valve: Trileaflet; moderately calcified leaflets. There was trivial regurgitation. - Mitral valve: There was trivial regurgitation. - Left atrium: The atrium was moderately dilated. - Right ventricle: Pacer wire or catheter noted in right ventricle. Systolic function was reduced. - Right atrium: The atrium was mildly dilated. Pacer wire or catheter noted in right atrium. Central venous pressure (est):  15 mm Hg. - Atrial septum: No defect or patent foramen ovale was identified. - Tricuspid valve: Mildly thickened leaflets. There was mild regurgitation. - Pulmonary arteries: PA peak pressure: 39 mm Hg (S). - Pericardium, extracardiac: There was no pericardial effusion.  Impressions:  - Mild LVH with LVEF  approximately 30-35%, septal dyssynergy, grade 1 diastolic dysfunction. Sclerotic aortic valve with trivial aortic regurgitation. Device wire noted in right heart. Reduced RV contraction. Thickened tricuspid valve with mild tricuspid regurgitation and PASP 39 mmHg. No obvious vegetations noted - shadowing in region of tricuspid valve likely secondary to device wire. If high suspicion of endocarditis, TEE could be considered.   Scheduled Meds: . aspirin EC  81 mg Oral Daily  . carvedilol  6.25 mg Oral BID WC  . cefTRIAXone (ROCEPHIN)  IV  1 g Intravenous Q24H  . docusate sodium  100 mg Oral BID  . enoxaparin (LOVENOX) injection  30 mg Subcutaneous Q24H  . ezetimibe  10 mg Oral Daily  . insulin aspart  0-15 Units Subcutaneous TID WC  . insulin aspart  0-5 Units Subcutaneous QHS  . insulin glargine  25 Units Subcutaneous QHS  . isosorbide mononitrate  30 mg Oral Daily  . linagliptin  5 mg Oral Daily  . pantoprazole  40 mg Oral Daily  . sodium chloride  3 mL Intravenous Q12H  . vancomycin  1,000 mg Intravenous Q24H  . vitamin B-12  1,000 mcg Oral Daily   Continuous Infusions: . sodium chloride 20 mL/hr at 10/22/13 1734    Time spent: 25 minutes  Eulogio Bear  [facility] Pager [phone]. If 7PM-7AM, please contact night-coverage at [email or web address], password [facility] 10/23/2013, 8:08 AM  LOS: 4 days

## 2013-10-23 NOTE — Clinical Social Work Placement (Addendum)
Clinical Social Work Department CLINICAL SOCIAL WORK PLACEMENT NOTE 10/23/2013  Patient:  Colin [name], Colin [name]  Account Number:  [id number] Admit date:  10/19/2013  Clinical Social Worker:  Korayma Hagwood Givens, LCSW  Date/time:  10/23/2013 03:08 AM  Clinical Social Work is seeking post-discharge placement for this patient at the following level of care:   SKILLED NURSING   (*CSW will update this form in Epic as items are completed)     Patient/family provided with [place] Department of Clinical Social Work's list of facilities offering this level of care within the geographic area requested by the patient (or if unable, by the patient's family).  10/23/2013  Patient/family informed of their freedom to choose among providers that offer the needed level of care, that participate in Medicare, Medicaid or managed care program needed by the patient, have an available bed and are willing to accept the patient.    Patient/family informed of [facility]' ownership interest in [facility], as well as of the fact that they are under no obligation to receive care at this facility.  PASARR submitted to EDS on 05/06/2013  PASARR number received on  05/06/2013  FL2 transmitted to all facilities in geographic area requested by pt/family on  10/23/2013 FL2 transmitted to all facilities within larger geographic area on   Patient informed that his/her managed care company has contracts with or will negotiate with  certain facilities, including the following:     Patient/family informed of bed offers received: 10/27/13  Patient chooses bed at [facility] Physician recommends and patient chooses bed at    Patient to be transferred to [facility] on 10/31/13 Patient to be transferred to facility by ambulance Patient and family notified of transfer on 10/31/13 Name of family member notified: Daughter Ernst Bowler and son, Damon Elliotte by phone.   The following physician request were entered in  Epic:   Additional Comments: 10/23/13 - Facility preference is [facility]. 10/24/13: CSW contacted [facility] and spoke with Suanne Marker in admissions about patient. They will review information and verify insurance. CSW will f/u with Suanne Marker as patient nears readiness for discharge. 10/27/13: CSW visited room and talked with patient, son and patient's sister to inform them that [facility] can take patient at discharge. Family appreciative of CSW's visit and information shared.

## 2013-10-23 NOTE — Progress Notes (Signed)
  Echocardiogram Echocardiogram Transesophageal has been performed.  Philipp Deputy 10/23/2013, 4:46 PM

## 2013-10-23 NOTE — H&P (View-Only) (Signed)
PROGRESS NOTE  Colin [name] [id number] DOB: August 22, 1928 DOA: 10/19/2013 PCP: Horatio Pel, MD   Assessment/Plan:  Bacteremia: 2/2 BC growing enterococcus sp.   UA negative and Urine cx multiple bacterial morphotypes.  Echo shows Thickened tricuspid valve with mild tricuspid regurgitation . No obvious vegetations noted shadowing in region of tricuspid valve likely secondary to device wire.  -TEE 10/1 - vanc/ceftriaxone  -Repeat BC.  -ID consult    CORONARY ARTERY DISEASE   Cardiac pacemaker in situ   Chronic systolic CHF (congestive heart failure): compensated   Peripheral vascular disease, unspecified  acute renal failue with CKD (chronic kidney disease), stage III. BUN down. Creat down. Saline lock   Falls at home, multiple.- SNF?   Type 2 diabetes, uncontrolled, with renal manifestation: adjust insulin   Weakness generalized protien calorie malnutrition troponins trending down and no CP. Likely from renal failure.  Code Status:  full Family Communication:   Disposition Plan:  SNF?  Consultants:    Procedures:     Antibiotics:  Rocephin  10/20/13 - 9/30  Vancomycin 9/30  HPI/Subjective: Doing well overall- no SOB, no CP  Objective: Filed Vitals:   10/23/13 0602  BP: 137/56  Pulse: 62  Temp: 99.2 F (37.3 C)  Resp: 18    Intake/Output Summary (Last 24 hours) at 10/23/13 0808 Last data filed at 10/23/13 [id number]  Gross per 24 hour  Intake    960 ml  Output   1200 ml  Net   -240 ml   Filed Weights   10/20/13 2120 10/21/13 2113 10/22/13 2119  Weight: 68.7 kg (151 lb 7.3 oz) 68.7 kg (151 lb 7.3 oz) 68.7 kg (151 lb 7.3 oz)    Exam:   General:  Frail, elderly. comfortable  Cardiovascular: RRR without MGR  Respiratory: CTA without WRR  Abdomen: s, nt, nd  Ext: no CCE  Basic Metabolic Panel:  Recent Labs Lab 10/19/13 0651 10/19/13 1200 10/20/13 0532 10/21/13 0554  NA 135*  --  135* 139  K 5.2  --  4.9 5.2  CL 98  --  104  109  CO2 21  --  21 20  GLUCOSE 214*  --  167* 108*  BUN 83*  --  68* 49*  CREATININE 2.75* 2.51* 2.15* 1.80*  CALCIUM 10.1  --  9.1 8.7   Liver Function Tests:  Recent Labs Lab 10/19/13 0651 10/20/13 0532  AST 24 20  ALT 18 14  ALKPHOS 84 65  BILITOT 0.7 0.3  PROT 9.4* 6.9  ALBUMIN 2.9* 2.2*   No results found for this basename: LIPASE, AMYLASE,  in the last 168 hours No results found for this basename: AMMONIA,  in the last 168 hours CBC:  Recent Labs Lab 10/19/13 0651 10/19/13 1200 10/20/13 0532 10/21/13 0554  WBC 12.0* 17.6* 11.9* 9.0  NEUTROABS 9.8*  --   --  6.3  HGB 10.9* 9.1* 9.0* 9.0*  HCT 34.5* 28.6* 28.7* 28.3*  MCV 77.5* 76.3* 76.5* 76.3*  PLT 250 205 216 218   Cardiac Enzymes:  Recent Labs Lab 10/19/13 1200 10/19/13 1707 10/19/13 2208  TROPONINI 0.59* 0.39* <0.30   BNP (last 3 results)  Recent Labs  10/19/13 1200  PROBNP 2214.0*   CBG:  Recent Labs Lab 10/22/13 0813 10/22/13 1152 10/22/13 1645 10/22/13 2118 10/23/13 0738  GLUCAP 71 160* 148* 189* 85    Recent Results (from the past 240 hour(s))  CULTURE, BLOOD (ROUTINE X 2)     Status: None  Collection Time    10/19/13  6:52 AM      Result Value Ref Range Status   Specimen Description BLOOD RIGHT ARM   Final   Special Requests BOTTLES DRAWN AEROBIC AND ANAEROBIC 5CC EA   Final   Culture  Setup Time     Final   Value: 10/19/2013 13:29     Performed at [facility]   Culture     Final   Value: ENTEROCOCCUS SPECIES     Note: SUSCEPTIBILITIES PERFORMED ON PREVIOUS CULTURE WITHIN THE LAST 5 DAYS.     Note: Gram Stain Report Called to,Read Back By and Verified With: JAY NARAMDAS @ [id number] ON [id number] BY [facility]     Performed at [facility]   Report Status 10/23/2013 FINAL   Final  CULTURE, BLOOD (ROUTINE X 2)     Status: None   Collection Time    10/19/13  6:58 AM      Result Value Ref Range Status   Specimen Description BLOOD LEFT ARM   Final   Special Requests  BOTTLES DRAWN AEROBIC ONLY 2CC   Final   Culture  Setup Time     Final   Value: 10/19/2013 13:29     Performed at [facility]   Culture     Final   Value: ENTEROCOCCUS SPECIES     Note: Gram Stain Report Called to,Read Back By and Verified With: KAMI MOORE ON 10/20/2013 AT 10:25P BY WILEJ     Performed at [facility]   Report Status 10/23/2013 FINAL   Final   Organism ID, Bacteria ENTEROCOCCUS SPECIES   Final  URINE CULTURE     Status: None   Collection Time    10/19/13  9:12 AM      Result Value Ref Range Status   Specimen Description URINE, RANDOM   Final   Special Requests NONE   Final   Culture  Setup Time     Final   Value: 10/19/2013 17:01     Performed at [place]     Final   Value: >=100,000 COLONIES/ML     Performed at [facility]   Culture     Final   Value: Multiple bacterial morphotypes present, none predominant. Suggest appropriate recollection if clinically indicated.     Performed at [facility]   Report Status 10/20/2013 FINAL   Final     Studies: No results found. Echo Left ventricle: The cavity size was normal. Wall thickness was increased in a pattern of mild LVH. Systolic function was moderately to severely reduced. The estimated ejection fraction was in the range of 30% to 35%. Diffuse hypokinesis. Doppler parameters are consistent with abnormal left ventricular relaxation (grade 1 diastolic dysfunction). - Ventricular septum: Septal motion showed abnormal function and dyssynergy. - Aortic valve: Trileaflet; moderately calcified leaflets. There was trivial regurgitation. - Mitral valve: There was trivial regurgitation. - Left atrium: The atrium was moderately dilated. - Right ventricle: Pacer wire or catheter noted in right ventricle. Systolic function was reduced. - Right atrium: The atrium was mildly dilated. Pacer wire or catheter noted in right atrium. Central venous pressure (est):  15 mm Hg. - Atrial septum: No defect or patent foramen ovale was identified. - Tricuspid valve: Mildly thickened leaflets. There was mild regurgitation. - Pulmonary arteries: PA peak pressure: 39 mm Hg (S). - Pericardium, extracardiac: There was no pericardial effusion.  Impressions:  - Mild LVH with LVEF  approximately 30-35%, septal dyssynergy, grade 1 diastolic dysfunction. Sclerotic aortic valve with trivial aortic regurgitation. Device wire noted in right heart. Reduced RV contraction. Thickened tricuspid valve with mild tricuspid regurgitation and PASP 39 mmHg. No obvious vegetations noted - shadowing in region of tricuspid valve likely secondary to device wire. If high suspicion of endocarditis, TEE could be considered.   Scheduled Meds: . aspirin EC  81 mg Oral Daily  . carvedilol  6.25 mg Oral BID WC  . cefTRIAXone (ROCEPHIN)  IV  1 g Intravenous Q24H  . docusate sodium  100 mg Oral BID  . enoxaparin (LOVENOX) injection  30 mg Subcutaneous Q24H  . ezetimibe  10 mg Oral Daily  . insulin aspart  0-15 Units Subcutaneous TID WC  . insulin aspart  0-5 Units Subcutaneous QHS  . insulin glargine  25 Units Subcutaneous QHS  . isosorbide mononitrate  30 mg Oral Daily  . linagliptin  5 mg Oral Daily  . pantoprazole  40 mg Oral Daily  . sodium chloride  3 mL Intravenous Q12H  . vancomycin  1,000 mg Intravenous Q24H  . vitamin B-12  1,000 mcg Oral Daily   Continuous Infusions: . sodium chloride 20 mL/hr at 10/22/13 1734    Time spent: 25 minutes  Eulogio Bear  [facility] Pager [phone]. If 7PM-7AM, please contact night-coverage at [email or web address], password [facility] 10/23/2013, 8:08 AM  LOS: 4 days

## 2013-10-23 NOTE — Progress Notes (Signed)
PT Cancellation Note  Patient Details Name: Colin [name] MRN: [id number] DOB: 1928/06/23   Cancelled Treatment:    Reason Eval/Treat Not Completed: Patient at procedure or test/unavailable;Other (comment) (for TEE)   Ladona Ridgel 10/23/2013, 11:13 AM Gerlean Ren PT Acute Rehab Services [phone] [place] [phone]

## 2013-10-23 NOTE — Progress Notes (Signed)
Pt NPO for TEE

## 2013-10-23 NOTE — Consult Note (Signed)
ELECTROPHYSIOLOGY CONSULT NOTE    Patient ID: Colin [name] MRN: [id number], DOB/AGE: 1928/11/30 80 y.o.  Admit date: 10/19/2013 Date of Consult: 10-23-13  Primary Physician: Horatio Pel, MD Primary Cardiologist: Nahser  Reason for Consultation: bacteremia and indwelling pacemaker  HPI:  Colin [name] is a 80 y.o. male with a past medical history significant for diabetes, CAD/CABG (04), sinus node dysfunction status post pacemaker 2007, chronic renal insufficiency,  PVD (R gangrenous toe removed, Fem-Pop April 2015), CHF (EF 30%).  He was admitted on 9-27 with fever and falls at home. He had fever for ~ 2 weeks PTA. Blood cultures have grown enterococcus.  TEE today demonstrated vegetation on RV lead (4X57mm).  EP has been asked to evaluate for treatment options.  He had a short stay at SNF post fem-pop and toe amputations and has been living at home with his wife since.  She reports that he has had several falls and on the day of admission she found him on the floor and confused.  He is not very active at home.  He denies chest pain, shortness of breath, nausea, vomiting, diarrhea.  ROS is otherwise negative.    TEE 10-23-13 demonstrated EF 30-35%, no RWMA, large vegetation on RV lead in region of tricuspid valve.   Past Medical History  Diagnosis Date  . Coronary artery disease     Status post CABG in 2004  . Peripheral vascular disease     Status post right femoropopliteal bypass  . Dyslipidemia   . Diabetes mellitus with peripheral artery disease   . Hypertension   . Chronic renal insufficiency     creatinine around 2.4  . CHF (congestive heart failure) 2007    ef 38%  . History of CVA (cerebrovascular accident)   . Cardiomyopathy   . COPD (chronic obstructive pulmonary disease)   . History of anemia   . Myocardial infarction   . Shortness of breath   . Pacemaker   . Pneumonia   . Anginal pain   . Dementia   . GERD (gastroesophageal reflux disease)   .  Arthritis      Surgical History:  Past Surgical History  Procedure Laterality Date  . Insert / replace / remove pacemaker  05/17/2005    [employer] dual chamber ddd mode   . Coronary artery bypass graft  4 of 2004    4 vessel bypass  . Femoral bypass  july 2006    right with right great toe amputation  . Cardiac catheterization  04/29/2002    Dr. Glade Lloyd  . Esophagogastroduodenoscopy  07/21/2011    Procedure: ESOPHAGOGASTRODUODENOSCOPY (EGD);  Surgeon: Beryle Beams, MD;  Location: [facility];  Service: Endoscopy;  Laterality: N/A;  . Coronary angioplasty    . Femoral-popliteal bypass graft Left 05/01/2013    Procedure: LEFT FEMORAL TO ABOVE KNEE POPLITEAL ARTERY BYPASS GRAFT WITH INTRAOPERATIVE ARTEROGRAM;  Surgeon: Mal Misty, MD;  Location: [facility];  Service: Vascular;  Laterality: Left;  . Amputation Left 05/05/2013    Procedure: AMPUTATION RAY- FIRST AND SECOND-LEFT FOOT;  Surgeon: Newt Minion, MD;  Location: [facility];  Service: Orthopedics;  Laterality: Left;     Prescriptions prior to admission  Medication Sig Dispense Refill  . aspirin EC 81 MG tablet Take 81 mg by mouth daily.      . B Complex-C (B-COMPLEX WITH VITAMIN C) tablet Take 1 tablet by mouth daily.      . carvedilol (COREG) 6.25 MG tablet Take  6.25 mg by mouth 2 (two) times daily with a meal.      . ezetimibe (ZETIA) 10 MG tablet Take 10 mg by mouth daily.      . furosemide (LASIX) 40 MG tablet Take 40 mg by mouth daily.      . insulin glargine (LANTUS) 100 UNIT/ML injection Inject 20 Units into the skin at bedtime.      . isosorbide mononitrate (IMDUR) 30 MG 24 hr tablet Take 30 mg by mouth daily.      Marland Kitchen linagliptin (TRADJENTA) 5 MG TABS tablet Take 5 mg by mouth daily.      . Omega-3 Fatty Acids (FISH OIL) 1000 MG CAPS Take 1,000 mg by mouth daily.       Marland Kitchen omeprazole (PRILOSEC) 40 MG capsule Take 40 mg by mouth daily.      . vitamin B-12 (CYANOCOBALAMIN) 1000 MCG tablet Take 1,000 mcg by mouth daily.      .  [DISCONTINUED] carvedilol (COREG) 6.25 MG tablet TAKE 1 TABLET BY MOUTH TWICE A DAY WITH A MEAL  60 tablet  0  . glucose 4 GM chewable tablet Chew 4 tablets (16 g total) by mouth as needed for low blood sugar.  50 tablet  12    Inpatient Medications:  . aspirin EC  81 mg Oral Daily  . carvedilol  6.25 mg Oral BID WC  . cefTRIAXone (ROCEPHIN)  IV  1 g Intravenous Q24H  . docusate sodium  100 mg Oral BID  . enoxaparin (LOVENOX) injection  30 mg Subcutaneous Q24H  . ezetimibe  10 mg Oral Daily  . insulin aspart  0-15 Units Subcutaneous TID WC  . insulin aspart  0-5 Units Subcutaneous QHS  . insulin glargine  25 Units Subcutaneous QHS  . isosorbide mononitrate  30 mg Oral Daily  . linagliptin  5 mg Oral Daily  . pantoprazole  40 mg Oral Daily  . sodium chloride  3 mL Intravenous Q12H  . vancomycin  1,000 mg Intravenous Q24H  . vitamin B-12  1,000 mcg Oral Daily    Allergies:  Allergies  Allergen Reactions  . Lisinopril Other (See Comments)    REACTION:  Elevated potassium,renal insuf    History   Social History  . Marital Status: Married    Spouse Name: N/A    Number of Children: N/A  . Years of Education: N/A   Occupational History  . Not on file.   Social History Main Topics  . Smoking status: Former Smoker -- 1.00 packs/day for 50 years    Types: Cigarettes    Quit date: 06/09/2002  . Smokeless tobacco: Never Used  . Alcohol Use: No  . Drug Use: No  . Sexual Activity: Not Currently   Other Topics Concern  . Not on file   Social History Narrative   Lives in a house.  Lives with his wive.  Drives.  Uses a cane for ambulation.       Family History  Problem Relation Age of Onset  . Hyperlipidemia Mother   . Heart disease Father   . Diabetes    . Diabetes Sister   . Hypertension Sister   . Hyperlipidemia Sister     BP 167/54  Pulse 68  Temp(Src) 98.5 F (36.9 C) (Oral)  Resp 16  Ht 5\' 9"  (1.753 m)  Wt 151 lb 7.3 oz (68.7 kg)  BMI 22.36 kg/m2  SpO2  99%  Physical Exam: elderly chronically ill appearingNAD HEENT: Unremarkable,Bushyhead, AT Neck:  6 JVD, no thyromegally Back:  No CVA tenderness Lungs:  Clear with no wheezes, rales, or rhonchi HEART:  Regular rate rhythm, no murmurs, no rubs, no clicks Abd:  soft, positive bowel sounds, no organomegally, no rebound, no guarding Ext:  2 plus pulses, no edema, no cyanosis, no clubbing Skin:  No rashes no nodules Neuro:  CN II through XII intact, motor grossly intact    Labs:   Lab Results  Component Value Date   WBC 5.9 10/23/2013   HGB 8.2* 10/23/2013   HCT 26.2* 10/23/2013   MCV 79.2 10/23/2013   PLT 243 10/23/2013    Recent Labs Lab 10/20/13 0532  10/23/13 0745  NA 135*  < > 138  K 4.9  < > 4.4  CL 104  < > 108  CO2 21  < > 21  BUN 68*  < > 31*  CREATININE 2.15*  < > 1.70*  CALCIUM 9.1  < > 8.9  PROT 6.9  --   --   BILITOT 0.3  --   --   ALKPHOS 65  --   --   ALT 14  --   --   AST 20  --   --   GLUCOSE 167*  < > 90  < > = values in this interval not displayed.   Radiology/Studies: Dg Chest 2 View 10/19/2013   CLINICAL DATA:  Fever, confusion and fall.  EXAM: CHEST  2 VIEW  COMPARISON:  04/30/2013  FINDINGS: Stable appearance of the right dual lead cardiac pacemaker. Heart and mediastinum are stable. Post CABG changes in the chest. There is mild elevation of the posterior hemidiaphragms bilaterally and this is similar to the prior examination. No evidence for airspace disease or pleural effusions.  IMPRESSION: No acute cardiopulmonary disease.   Electronically Signed   By: Markus Daft M.D.   On: 10/19/2013 07:59   Ct Head Wo Contrast 10/19/2013   CLINICAL DATA:  Fall and complains of posterior neck pain.  EXAM: CT HEAD WITHOUT CONTRAST  CT CERVICAL SPINE WITHOUT CONTRAST  TECHNIQUE: Multidetector CT imaging of the head and cervical spine was performed following the standard protocol without intravenous contrast. Multiplanar CT image reconstructions of the cervical spine were also  generated.  COMPARISON:  Head CT 07/01/2012  FINDINGS: CT HEAD FINDINGS  There is cerebral atrophy which has minimally changed. There is extensive low-density in the periventricular and subcortical white matter suggesting chronic changes. No evidence for acute hemorrhage, mass lesion, midline shift, hydrocephalus or large infarct. Again noted is an mixed sclerotic and lucent process involving the greater wing of the left sphenoid. This appears to obliterate the left sphenoid sinus and this has not significantly changed. Findings suggest a chronic benign process such as Paget's disease.  CT CERVICAL SPINE FINDINGS  Lung apices are clear. Negative for a fracture or dislocation. Multilevel degenerative disease in the cervical spine with disc space narrowing from C3-C7. Normal alignment at the cervical-thoracic junction. Multiple levels of the uncovertebral spurring.  IMPRESSION: No acute intracranial abnormality.  No acute bone abnormality in the cervical spine.  Atrophy and evidence for chronic small vessel ischemic changes.  Multilevel degenerative disease in the cervical spine.   Electronically Signed   By: Markus Daft M.D.   On: 10/19/2013 08:12   Ct Cervical Spine Wo Contrast 10/19/2013   CLINICAL DATA:  Fall and complains of posterior neck pain.  EXAM: CT HEAD WITHOUT CONTRAST  CT CERVICAL SPINE WITHOUT CONTRAST  TECHNIQUE: Multidetector CT  imaging of the head and cervical spine was performed following the standard protocol without intravenous contrast. Multiplanar CT image reconstructions of the cervical spine were also generated.  COMPARISON:  Head CT 07/01/2012  FINDINGS: CT HEAD FINDINGS  There is cerebral atrophy which has minimally changed. There is extensive low-density in the periventricular and subcortical white matter suggesting chronic changes. No evidence for acute hemorrhage, mass lesion, midline shift, hydrocephalus or large infarct. Again noted is an mixed sclerotic and lucent process involving the  greater wing of the left sphenoid. This appears to obliterate the left sphenoid sinus and this has not significantly changed. Findings suggest a chronic benign process such as Paget's disease.  CT CERVICAL SPINE FINDINGS  Lung apices are clear. Negative for a fracture or dislocation. Multilevel degenerative disease in the cervical spine with disc space narrowing from C3-C7. Normal alignment at the cervical-thoracic junction. Multiple levels of the uncovertebral spurring.  IMPRESSION: No acute intracranial abnormality.  No acute bone abnormality in the cervical spine.  Atrophy and evidence for chronic small vessel ischemic changes.  Multilevel degenerative disease in the cervical spine.   Electronically Signed   By: Markus Daft M.D.   On: 10/19/2013 08:12    EKG:nsr  TELEMETRY:NSR with PVC's and PAC's  DEVICE HISTORY: STJ dual chamber pacemaker implanted 2007 for sinus node dysfunction; device interrogation pending this admission; last device interrogation demonstrated underlying rhythm of sinus brady in the 40's  A/P 1. PPM lead endocarditis 2. Enterococcal bacteremia 3. H/o symptomatic sinus node dysfunction, not PPM dependent Rec: The patient has a class 1 indication for PM lead extraction. He is not critically ill and appears to have improved since initiation of anti-biotics. Will plan extraction as our schedule permits.  Mikle Bosworth.D.

## 2013-10-24 ENCOUNTER — Encounter (HOSPITAL_COMMUNITY): Payer: Self-pay | Admitting: Cardiology

## 2013-10-24 DIAGNOSIS — I2510 Atherosclerotic heart disease of native coronary artery without angina pectoris: Secondary | ICD-10-CM

## 2013-10-24 DIAGNOSIS — Y848 Other medical procedures as the cause of abnormal reaction of the patient, or of later complication, without mention of misadventure at the time of the procedure: Secondary | ICD-10-CM

## 2013-10-24 DIAGNOSIS — T827XXA Infection and inflammatory reaction due to other cardiac and vascular devices, implants and grafts, initial encounter: Secondary | ICD-10-CM

## 2013-10-24 DIAGNOSIS — N183 Chronic kidney disease, stage 3 (moderate): Secondary | ICD-10-CM

## 2013-10-24 DIAGNOSIS — R7881 Bacteremia: Secondary | ICD-10-CM

## 2013-10-24 DIAGNOSIS — I5022 Chronic systolic (congestive) heart failure: Secondary | ICD-10-CM

## 2013-10-24 DIAGNOSIS — B952 Enterococcus as the cause of diseases classified elsewhere: Secondary | ICD-10-CM

## 2013-10-24 LAB — GLUCOSE, CAPILLARY
GLUCOSE-CAPILLARY: 79 mg/dL (ref 70–99)
GLUCOSE-CAPILLARY: 109 mg/dL — AB (ref 70–99)
GLUCOSE-CAPILLARY: 166 mg/dL — AB (ref 70–99)
Glucose-Capillary: 143 mg/dL — ABNORMAL HIGH (ref 70–99)

## 2013-10-24 MED ORDER — ENOXAPARIN SODIUM 40 MG/0.4ML ~~LOC~~ SOLN
40.0000 mg | SUBCUTANEOUS | Status: AC
  Administered 2013-10-25: 40 mg via SUBCUTANEOUS
  Filled 2013-10-24: qty 0.4

## 2013-10-24 MED ORDER — DEXTROSE 5 % IV SOLN
2.0000 g | Freq: Two times a day (BID) | INTRAVENOUS | Status: DC
  Administered 2013-10-24 – 2013-10-26 (×5): 2 g via INTRAVENOUS
  Filled 2013-10-24 (×8): qty 2

## 2013-10-24 MED ORDER — FUROSEMIDE 40 MG PO TABS
40.0000 mg | ORAL_TABLET | Freq: Every day | ORAL | Status: DC
  Administered 2013-10-24 – 2013-10-31 (×8): 40 mg via ORAL
  Filled 2013-10-24 (×9): qty 1

## 2013-10-24 NOTE — Progress Notes (Signed)
UR completed 

## 2013-10-24 NOTE — Care Management Note (Signed)
CARE MANAGEMENT NOTE 10/24/2013  Patient:  Colin [name], Colin [name]   Account Number:  [id number]  Date Initiated:  10/22/2013  Documentation initiated by:  Stelzner,Jacksen Isip  Subjective/Objective Assessment:   CM following for progression and d/c planning.     Action/Plan:   10/22/2013 Met with pt and IM given , pt alert, oriented. Will continue to follow.   Anticipated DC Date:     Anticipated DC Plan:  SKILLED NURSING FACILITY         Choice offered to / List presented to:             Status of service:   Medicare Important Message given?  YES (If response is "NO", the following Medicare IM given date fields will be blank) Date Medicare IM given:  10/22/2013 Medicare IM given by:  Swartzlander,Mellie Buccellato Date Additional Medicare IM given:  10/24/2013 Additional Medicare IM given by:  [facility]  Discharge Disposition:    Per UR Regulation:    If discussed at Long Length of Stay Meetings, dates discussed:    Comments:  10/24/2013 Met with pt , ongoing medical concerns, not ready to d/c today, IM given. CM will continue to follow. Jasmine Pang RN MPH, case manager, [phone]

## 2013-10-24 NOTE — Progress Notes (Signed)
[place] for Infectious Disease  Date of Admission:  10/19/2013  Antibiotics: Ampicillin and ceftriaxone  Subjective: No complaints  Objective: Temp:  [98.3 F (36.8 C)-99.5 F (37.5 C)] 99.5 F (37.5 C) (10/02 0950) Pulse Rate:  [63-78] 63 (10/02 0950) Resp:  [16-19] 16 (10/02 0950) BP: (123-167)/(50-62) 144/56 mmHg (10/02 0950) SpO2:  [97 %-99 %] 98 % (10/02 0950) Weight:  [162 lb 14.7 oz (73.9 kg)] 162 lb 14.7 oz (73.9 kg) (10/01 2125)  General: awake, in chair, nad Skin: no rashes Lungs: CTA B Cor: RRR Abdomen: soft, nt   Lab Results Lab Results  Component Value Date   WBC 5.9 10/23/2013   HGB 8.2* 10/23/2013   HCT 26.2* 10/23/2013   MCV 79.2 10/23/2013   PLT 243 10/23/2013    Lab Results  Component Value Date   CREATININE 1.70* 10/23/2013   BUN 31* 10/23/2013   NA 138 10/23/2013   K 4.4 10/23/2013   CL 108 10/23/2013   CO2 21 10/23/2013    Lab Results  Component Value Date   ALT 14 10/20/2013   AST 20 10/20/2013   ALKPHOS 65 10/20/2013   BILITOT 0.3 10/20/2013      Microbiology: Recent Results (from the past 240 hour(s))  CULTURE, BLOOD (ROUTINE X 2)     Status: None   Collection Time    10/19/13  6:52 AM      Result Value Ref Range Status   Specimen Description BLOOD RIGHT ARM   Final   Special Requests BOTTLES DRAWN AEROBIC AND ANAEROBIC 5CC EA   Final   Culture  Setup Time     Final   Value: 10/19/2013 13:29     Performed at [facility]   Culture     Final   Value: ENTEROCOCCUS SPECIES     Note: SUSCEPTIBILITIES PERFORMED ON PREVIOUS CULTURE WITHIN THE LAST 5 DAYS.     Note: Gram Stain Report Called to,Read Back By and Verified With: JAY NARAMDAS @ [id number] ON PB:[id number] BY [facility]     Performed at [facility]   Report Status 10/23/2013 FINAL   Final  CULTURE, BLOOD (ROUTINE X 2)     Status: None   Collection Time    10/19/13  6:58 AM      Result Value Ref Range Status   Specimen Description BLOOD LEFT ARM   Final   Special  Requests BOTTLES DRAWN AEROBIC ONLY 2CC   Final   Culture  Setup Time     Final   Value: 10/19/2013 13:29     Performed at [facility]   Culture     Final   Value: ENTEROCOCCUS SPECIES     Note: Gram Stain Report Called to,Read Back By and Verified With: KAMI MOORE ON 10/20/2013 AT 10:25P BY [facility]     Performed at [facility]   Report Status 10/23/2013 FINAL   Final   Organism ID, Bacteria ENTEROCOCCUS SPECIES   Final  URINE CULTURE     Status: None   Collection Time    10/19/13  9:12 AM      Result Value Ref Range Status   Specimen Description URINE, RANDOM   Final   Special Requests NONE   Final   Culture  Setup Time     Final   Value: 10/19/2013 17:01     Performed at [place]     Final   Value: >=100,000 COLONIES/ML  Performed at [facility]     Final   Value: Multiple bacterial morphotypes present, none predominant. Suggest appropriate recollection if clinically indicated.     Performed at [facility]   Report Status 10/20/2013 FINAL   Final  CULTURE, BLOOD (ROUTINE X 2)     Status: None   Collection Time    10/23/13  7:40 AM      Result Value Ref Range Status   Specimen Description BLOOD LEFT HAND   Final   Special Requests BOTTLES DRAWN AEROBIC ONLY 3CC   Final   Culture  Setup Time     Final   Value: 10/23/2013 12:54     Performed at [facility]   Culture     Final   Value:        BLOOD CULTURE RECEIVED NO GROWTH TO DATE CULTURE WILL BE HELD FOR 5 DAYS BEFORE ISSUING A FINAL NEGATIVE REPORT     Performed at [facility]   Report Status PENDING   Incomplete  CULTURE, BLOOD (ROUTINE X 2)     Status: None   Collection Time    10/23/13  7:45 AM      Result Value Ref Range Status   Specimen Description BLOOD LEFT HAND   Final   Special Requests BOTTLES DRAWN AEROBIC ONLY 4CC   Final   Culture  Setup Time     Final   Value: 10/23/2013 12:54     Performed at [facility]    Culture     Final   Value:        BLOOD CULTURE RECEIVED NO GROWTH TO DATE CULTURE WILL BE HELD FOR 5 DAYS BEFORE ISSUING A FINAL NEGATIVE REPORT     Performed at [facility]   Report Status PENDING   Incomplete    Studies/Results: No results found.  Assessment/Plan: 1) pacemaker infection with Enterococcal bacteremia - on dual beta lactam therapy.  Extraction of PM on Monday.  Repeat blood cultures ngtd.   Will continue with the same.   Dr. Johnnye Sima available over the weekend if needed, otherwise I will follow up on Monday  Kyler Lerette, [place] for Infectious Disease [place] [email or web address] [phone] pager   [phone] cell 10/24/2013, 3:30 PM

## 2013-10-24 NOTE — Progress Notes (Signed)
Pt on schedule for Monday for OR extyraction perGT Continue abx

## 2013-10-24 NOTE — Progress Notes (Signed)
Physical Therapy Treatment Patient Details Name: Colin [name] MRN: [id number] DOB: 07/18/1928 Today's Date: 10/24/2013    History of Present Illness Colin [name] is an 80 y.o. male admitted 10/19/13 with fever and falls at home. Pt reports low-grade fever, weakness, poor PO intake, myalgia, HA, and neck pain for 1-2 weeks on admission. PMH of CAD, PVD, hx CVA, CHF. Pt was febrile in ED.     PT Comments    Pt. Pleasant , agreeable to get OOB with PT.  Pt. 's condom cath off , and he sat at EOB for ~5 minutes while we waited for RN to replace before walking.  RN and nursing tech tied up at the moment, so pt. Requested to walk short distance in room instead of in the hallway until cath could be reapplied.  Needed min guard assist for safety and steadiness.    Follow Up Recommendations  SNF;Supervision/Assistance - 24 hour     Equipment Recommendations  3in1 (PT)    Recommendations for Other Services       Precautions / Restrictions Precautions Precautions: Fall Precaution Comments: right forefoot ambutation and left great and 2nd toe amputation Required Braces or Orthoses: Other Brace/Splint Other Brace/Splint: Used pt's prosthetic shoes today Restrictions Weight Bearing Restrictions: No    Mobility  Bed Mobility Overal bed mobility: Modified Independent Bed Mobility: Supine to Sit     Supine to sit: HOB elevated     General bed mobility comments: brought himself to sitting with use of bedrails and increased time  Transfers Overall transfer level: Needs assistance Equipment used: Rolling walker (2 wheeled) Transfers: Sit to/from Stand Sit to Stand: Min assist         General transfer comment: min assist for sit to stand and to control descent to recliner.    Ambulation/Gait Ambulation/Gait assistance: Min guard Ambulation Distance (Feet): 15 Feet Assistive device: Rolling walker (2 wheeled) Gait Pattern/deviations: Step-through pattern Gait velocity:  quite slow   General Gait Details: Pt. chose to walk short distance in room as condom cath is now off and RN has not been able to get in to room to replace yet.     Stairs            Wheelchair Mobility    Modified Rankin (Stroke Patients Only)       Balance                                    Cognition Arousal/Alertness: Awake/alert Behavior During Therapy: WFL for tasks assessed/performed Overall Cognitive Status: History of cognitive impairments - at baseline       Memory: Decreased short-term memory              Exercises      General Comments        Pertinent Vitals/Pain Pain Assessment: No/denies pain    Home Living                      Prior Function            PT Goals (current goals can now be found in the care plan section) Progress towards PT goals: Progressing toward goals    Frequency  Min 3X/week    PT Plan Current plan remains appropriate    Co-evaluation             End of Session Equipment Utilized During Treatment: Gait  belt Activity Tolerance: Patient tolerated treatment well Patient left: in chair;with call bell/phone within reach;with chair alarm set     Time: 1356-1426 PT Time Calculation (min): 30 min  Charges:  $Gait Training: 8-22 mins $Therapeutic Activity: 8-22 mins                    G Codes:      Ladona Ridgel 10/24/2013, 2:39 PM Gerlean Ren PT [facility] [place] [phone]

## 2013-10-24 NOTE — Progress Notes (Signed)
ANTIBIOTIC CONSULT NOTE - INITIAL  Pharmacy Consult for abx Indication: pacer endocarditis  Allergies  Allergen Reactions  . Lisinopril Other (See Comments)    REACTION:  Elevated potassium,renal insuf    Patient Measurements: Height: 5\' 9"  (175.3 cm) Weight: 162 lb 14.7 oz (73.9 kg) IBW/kg (Calculated) : 70.7 Adjusted Body Weight:   Vital Signs: Temp: 99.5 F (37.5 C) (10/02 0950) Temp Source: Oral (10/02 0451) BP: 144/56 mmHg (10/02 0950) Pulse Rate: 63 (10/02 0950) Intake/Output from previous day: 10/01 0701 - 10/02 0700 In: -  Out: 850 [Urine:850] Intake/Output from this shift: Total I/O In: 240 [P.O.:240] Out: 500 [Urine:500]  Labs:  Recent Labs  10/23/13 0745  WBC 5.9  HGB 8.2*  PLT 243  CREATININE 1.70*   Medical History: Past Medical History  Diagnosis Date  . Coronary artery disease     Status post CABG in 2004  . Peripheral vascular disease     Status post right femoropopliteal bypass  . Dyslipidemia   . Diabetes mellitus with peripheral artery disease   . Hypertension   . Chronic renal insufficiency     creatinine around 2.4  . CHF (congestive heart failure) 2007    ef 38%  . History of CVA (cerebrovascular accident)   . Cardiomyopathy   . COPD (chronic obstructive pulmonary disease)   . History of anemia   . Myocardial infarction   . Shortness of breath   . Pacemaker   . Pneumonia   . Anginal pain   . Dementia   . GERD (gastroesophageal reflux disease)   . Arthritis     Medications:  See EMR  Assessment: 80 yo male with CKD Stage III found to have GPC in 2/2 blood cultures.  He was placed on ceftriaxone as they were growing in pairs and chains; however, the cultures resulted as enterococcus.  Plan is to use amp/rocephin due to renal issue. Will adjust dose of rocephin to for endocarditis.    Plan:   Cont Ampicillin 2g IV q6 Change rocephin to 2g IV q12  Onnie Boer, PharmD Pager: [phone] 10/24/2013 2:33 PM

## 2013-10-24 NOTE — Progress Notes (Signed)
PROGRESS NOTE  Colin [name] [id number] DOB: [birth year] DOA: 10/19/2013 PCP: Horatio Pel, MD   Assessment/Plan:  Enterococcus Bacteremia/Pacemaker lead endocarditis:  TEE:  4 x 25 mm vegetation attached to the right atrial portion of the RV pacer lead. Seen by EP yesterday with plan for lead extraction on Monday 2/2 BC growing enterococcus sp.   - vanc/ceftriaxone  -Repeat BC.  -ID consult    CORONARY ARTERY DISEASE   Cardiac pacemaker in situ   Chronic systolic CHF (congestive heart failure): compensated   Peripheral vascular disease, unspecified  acute renal failue with CKD (chronic kidney disease), stage III. BUN down. Creat down. Saline lock   Falls at home, multiple.- SNF?   Type 2 diabetes, uncontrolled, with renal manifestation: adjust insulin   Weakness generalized protien calorie malnutrition troponins trending down and no CP. Likely from renal failure.  Code Status:  full Family Communication:   Disposition Plan:  SNF?  Consultants:    Procedures:     Antibiotics:  Rocephin  10/20/13 - 9/30  Vancomycin 9/30  HPI/Subjective: Ate breakfast No CP, no SOB  Objective: Filed Vitals:   10/24/13 0451  BP: 123/62  Pulse: 71  Temp: 99.5 F (37.5 C)  Resp: 16    Intake/Output Summary (Last 24 hours) at 10/24/13 0949 Last data filed at 10/23/13 1935  Gross per 24 hour  Intake      0 ml  Output    850 ml  Net   -850 ml   Filed Weights   10/21/13 2113 10/22/13 2119 10/23/13 2125  Weight: 68.7 kg (151 lb 7.3 oz) 68.7 kg (151 lb 7.3 oz) 73.9 kg (162 lb 14.7 oz)    Exam:   General:  Frail, elderly. comfortable  Cardiovascular: RRR without MGR  Respiratory: CTA without WRR  Abdomen: s, nt, nd  Ext: no CCE  Basic Metabolic Panel:  Recent Labs Lab 10/19/13 0651 10/19/13 1200 10/20/13 0532 10/21/13 0554 10/23/13 0745  NA 135*  --  135* 139 138  K 5.2  --  4.9 5.2 4.4  CL 98  --  104 109 108  CO2 21  --  21 20 21    GLUCOSE 214*  --  167* 108* 90  BUN 83*  --  68* 49* 31*  CREATININE 2.75* 2.51* 2.15* 1.80* 1.70*  CALCIUM 10.1  --  9.1 8.7 8.9   Liver Function Tests:  Recent Labs Lab 10/19/13 0651 10/20/13 0532  AST 24 20  ALT 18 14  ALKPHOS 84 65  BILITOT 0.7 0.3  PROT 9.4* 6.9  ALBUMIN 2.9* 2.2*   No results found for this basename: LIPASE, AMYLASE,  in the last 168 hours No results found for this basename: AMMONIA,  in the last 168 hours CBC:  Recent Labs Lab 10/19/13 0651 10/19/13 1200 10/20/13 0532 10/21/13 0554 10/23/13 0745  WBC 12.0* 17.6* 11.9* 9.0 5.9  NEUTROABS 9.8*  --   --  6.3  --   HGB 10.9* 9.1* 9.0* 9.0* 8.2*  HCT 34.5* 28.6* 28.7* 28.3* 26.2*  MCV 77.5* 76.3* 76.5* 76.3* 79.2  PLT 250 205 216 218 243   Cardiac Enzymes:  Recent Labs Lab 10/19/13 1200 10/19/13 1707 10/19/13 2208  TROPONINI 0.59* 0.39* <0.30   BNP (last 3 results)  Recent Labs  10/19/13 1200  PROBNP 2214.0*   CBG:  Recent Labs Lab 10/23/13 1445 10/23/13 1512 10/23/13 1540 10/23/13 1720 10/23/13 2121  GLUCAP 50* 102* 97 140* 236*    Recent Results (from  the past 240 hour(s))  CULTURE, BLOOD (ROUTINE X 2)     Status: None   Collection Time    10/19/13  6:52 AM      Result Value Ref Range Status   Specimen Description BLOOD RIGHT ARM   Final   Special Requests BOTTLES DRAWN AEROBIC AND ANAEROBIC 5CC EA   Final   Culture  Setup Time     Final   Value: 10/19/2013 13:29     Performed at [facility]   Culture     Final   Value: ENTEROCOCCUS SPECIES     Note: SUSCEPTIBILITIES PERFORMED ON PREVIOUS CULTURE WITHIN THE LAST 5 DAYS.     Note: Gram Stain Report Called to,Read Back By and Verified With: JAY NARAMDAS @ [id number] ON [id number] BY [facility]     Performed at [facility]   Report Status 10/23/2013 FINAL   Final  CULTURE, BLOOD (ROUTINE X 2)     Status: None   Collection Time    10/19/13  6:58 AM      Result Value Ref Range Status   Specimen Description BLOOD  LEFT ARM   Final   Special Requests BOTTLES DRAWN AEROBIC ONLY 2CC   Final   Culture  Setup Time     Final   Value: 10/19/2013 13:29     Performed at [facility]   Culture     Final   Value: ENTEROCOCCUS SPECIES     Note: Gram Stain Report Called to,Read Back By and Verified With: KAMI MOORE ON 10/20/2013 AT 10:25P BY WILEJ     Performed at [facility]   Report Status 10/23/2013 FINAL   Final   Organism ID, Bacteria ENTEROCOCCUS SPECIES   Final  URINE CULTURE     Status: None   Collection Time    10/19/13  9:12 AM      Result Value Ref Range Status   Specimen Description URINE, RANDOM   Final   Special Requests NONE   Final   Culture  Setup Time     Final   Value: 10/19/2013 17:01     Performed at [place]     Final   Value: >=100,000 COLONIES/ML     Performed at [facility]   Culture     Final   Value: Multiple bacterial morphotypes present, none predominant. Suggest appropriate recollection if clinically indicated.     Performed at [facility]   Report Status 10/20/2013 FINAL   Final  CULTURE, BLOOD (ROUTINE X 2)     Status: None   Collection Time    10/23/13  7:40 AM      Result Value Ref Range Status   Specimen Description BLOOD LEFT HAND   Final   Special Requests BOTTLES DRAWN AEROBIC ONLY 3CC   Final   Culture  Setup Time     Final   Value: 10/23/2013 12:54     Performed at [facility]   Culture     Final   Value:        BLOOD CULTURE RECEIVED NO GROWTH TO DATE CULTURE WILL BE HELD FOR 5 DAYS BEFORE ISSUING A FINAL NEGATIVE REPORT     Performed at [facility]   Report Status PENDING   Incomplete  CULTURE, BLOOD (ROUTINE X 2)     Status: None   Collection Time    10/23/13  7:45 AM      Result  Value Ref Range Status   Specimen Description BLOOD LEFT HAND   Final   Special Requests BOTTLES DRAWN AEROBIC ONLY 4CC   Final   Culture  Setup Time     Final   Value: 10/23/2013 12:54      Performed at [facility]   Culture     Final   Value:        BLOOD CULTURE RECEIVED NO GROWTH TO DATE CULTURE WILL BE HELD FOR 5 DAYS BEFORE ISSUING A FINAL NEGATIVE REPORT     Performed at [facility]   Report Status PENDING   Incomplete     Studies: No results found. Echo Left ventricle: The cavity size was normal. Wall thickness was increased in a pattern of mild LVH. Systolic function was moderately to severely reduced. The estimated ejection fraction was in the range of 30% to 35%. Diffuse hypokinesis. Doppler parameters are consistent with abnormal left ventricular relaxation (grade 1 diastolic dysfunction). - Ventricular septum: Septal motion showed abnormal function and dyssynergy. - Aortic valve: Trileaflet; moderately calcified leaflets. There was trivial regurgitation. - Mitral valve: There was trivial regurgitation. - Left atrium: The atrium was moderately dilated. - Right ventricle: Pacer wire or catheter noted in right ventricle. Systolic function was reduced. - Right atrium: The atrium was mildly dilated. Pacer wire or catheter noted in right atrium. Central venous pressure (est): 15 mm Hg. - Atrial septum: No defect or patent foramen ovale was identified. - Tricuspid valve: Mildly thickened leaflets. There was mild regurgitation. - Pulmonary arteries: PA peak pressure: 39 mm Hg (S). - Pericardium, extracardiac: There was no pericardial effusion.  Impressions:  - Mild LVH with LVEF approximately 30-35%, septal dyssynergy, grade 1 diastolic dysfunction. Sclerotic aortic valve with trivial aortic regurgitation. Device wire noted in right heart. Reduced RV contraction. Thickened tricuspid valve with mild tricuspid regurgitation and PASP 39 mmHg. No obvious vegetations noted - shadowing in region of tricuspid valve likely secondary to device wire. If high suspicion of endocarditis, TEE could be considered.   Scheduled Meds: . ampicillin  (OMNIPEN) IV  2 g Intravenous 4 times per day  . aspirin EC  81 mg Oral Daily  . carvedilol  6.25 mg Oral BID WC  . cefTRIAXone (ROCEPHIN)  IV  1 g Intravenous Q24H  . docusate sodium  100 mg Oral BID  . enoxaparin (LOVENOX) injection  30 mg Subcutaneous Q24H  . ezetimibe  10 mg Oral Daily  . insulin aspart  0-15 Units Subcutaneous TID WC  . insulin aspart  0-5 Units Subcutaneous QHS  . insulin glargine  25 Units Subcutaneous QHS  . isosorbide mononitrate  30 mg Oral Daily  . linagliptin  5 mg Oral Daily  . pantoprazole  40 mg Oral Daily  . sodium chloride  3 mL Intravenous Q12H  . vitamin B-12  1,000 mcg Oral Daily   Continuous Infusions: . sodium chloride 20 mL/hr at 10/23/13 1442    Time spent: 25 minutes  Eulogio Bear  [facility] Pager [phone]. If 7PM-7AM, please contact night-coverage at [email or web address], password [facility] 10/24/2013, 9:49 AM  LOS: 5 days

## 2013-10-24 NOTE — Progress Notes (Signed)
Patient Name: Colin [name] Date of Encounter: 10/24/2013   Principal Problem:   Bacteremia Active Problems:   Cardiac pacemaker in situ   Fall   Weakness generalized   CORONARY ARTERY DISEASE   Chronic systolic CHF (congestive heart failure)   CKD (chronic kidney disease), stage III   Type 2 diabetes, uncontrolled, with renal manifestation   Peripheral vascular disease, unspecified    SUBJECTIVE  No chest pain or sob.  CURRENT MEDS . ampicillin (OMNIPEN) IV  2 g Intravenous 4 times per day  . aspirin EC  81 mg Oral Daily  . carvedilol  6.25 mg Oral BID WC  . cefTRIAXone (ROCEPHIN)  IV  1 g Intravenous Q24H  . docusate sodium  100 mg Oral BID  . enoxaparin (LOVENOX) injection  30 mg Subcutaneous Q24H  . ezetimibe  10 mg Oral Daily  . insulin aspart  0-15 Units Subcutaneous TID WC  . insulin aspart  0-5 Units Subcutaneous QHS  . insulin glargine  25 Units Subcutaneous QHS  . isosorbide mononitrate  30 mg Oral Daily  . linagliptin  5 mg Oral Daily  . pantoprazole  40 mg Oral Daily  . sodium chloride  3 mL Intravenous Q12H  . vitamin B-12  1,000 mcg Oral Daily    OBJECTIVE  Filed Vitals:   10/23/13 1550 10/23/13 1721 10/23/13 2125 10/24/13 0451  BP: 167/54 145/58 135/50 123/62  Pulse: 68 69 66 71  Temp:  98.3 F (36.8 C) 99.4 F (37.4 C) 99.5 F (37.5 C)  TempSrc:  Oral Oral Oral  Resp: 16 17 18 16   Height:      Weight:   162 lb 14.7 oz (73.9 kg)   SpO2: 99% 97% 98% 97%    Intake/Output Summary (Last 24 hours) at 10/24/13 1025 Last data filed at 10/23/13 1935  Gross per 24 hour  Intake      0 ml  Output    850 ml  Net   -850 ml   Filed Weights   10/21/13 2113 10/22/13 2119 10/23/13 2125  Weight: 151 lb 7.3 oz (68.7 kg) 151 lb 7.3 oz (68.7 kg) 162 lb 14.7 oz (73.9 kg)    PHYSICAL EXAM  General: Pleasant, NAD. Neuro: Alert and oriented X 3. Moves all extremities spontaneously. Psych: Normal affect. HEENT:  Normal  Neck: Supple without bruits or  JVD. Lungs:  Resp regular and unlabored, CTA. Heart: RRR no s3, s4, murmurs. Abdomen: Soft, non-tender, non-distended, BS + x 4.  Extremities: No clubbing, cyanosis or edema. DP/PT/Radials 2+ and equal bilaterally.  Accessory Clinical Findings  CBC  Recent Labs  10/23/13 0745  WBC 5.9  HGB 8.2*  HCT 26.2*  MCV 79.2  PLT 0000000   Basic Metabolic Panel  Recent Labs  10/23/13 0745  NA 138  K 4.4  CL 108  CO2 21  GLUCOSE 90  BUN 31*  CREATININE 1.70*  CALCIUM 8.9   TELE  Not on tele.  Radiology/Studies  Transesophageal Echocardiogram 10/23/2013  Study Conclusions  - Left ventricle: Systolic function was moderately to severely reduced. The estimated ejection fraction was in the range of 30% to 35%. Wall motion was normal; there were no regional wall motion abnormalities. - Aortic valve: No vegetation. Structurally normal valve. Trileaflet; normal thickness leaflets. There was no significant regurgitation. - Aorta: There is severe non-mobile plaque in the descending thoracic aorta. - Mitral valve: There is redundant mitral valve chordae. No evidence of vegetation. There was mild regurgitation. -  Left atrium: The atrium was dilated. No evidence of thrombus in the atrial cavity or appendage. - Right atrium: No evidence of thrombus in the atrial cavity or appendage. - Pulmonic valve: No vegetation. There was mild regurgitation.  Impressions:  - There is a large 25 mm x 4 mm, highly mobile vegetation attached to the right atrial portion of the RV pacer lead. This vegetation is in the proximity to the tricuspid valve and is affected by the tricuspid valve leaflets that move the vegetation between right atrium and right ventricle. Tricuspid valve doesn&'t appear to be affected and there is stable mild tricuspid regurgitation (when compared to prior study from August 2015).  No evidence of endocarditis in the aortic, mitral and pulmonic valve.  ASSESSMENT AND  PLAN  1.  Enterococcus Bacteremia/Pacemaker lead endocarditis:  With 4 x 25 mm vegetation attached to the right atrial portion of the RV pacer lead.  Seen by EP yesterday with plan for lead extraction on Monday.  Abx per IM/ID.  2.  CAD:  No chest pain or sob.  Cont asa, bb, nitrate, zetia,   3.  Chronic systolic CHF:  Wt recorded as elevated however on exam, volume stable.  I/O relatively flat.  Cont bb.  Would resume his home dose of lasix  - 40mg  daily.  He is not on acei 2/2 ckd.  4.  DMII:  Per IM.  5.  CKD III:  Improved off of lasix.  Wt trending up though accuracy unclear.  Would resume home dose of lasix.  6.  Anemia of chronic dzs:  H/H slowly drifting down.  Follow.  Signed, Murray Hodgkins NP

## 2013-10-25 DIAGNOSIS — I5022 Chronic systolic (congestive) heart failure: Secondary | ICD-10-CM

## 2013-10-25 DIAGNOSIS — R7881 Bacteremia: Secondary | ICD-10-CM

## 2013-10-25 LAB — GLUCOSE, CAPILLARY
GLUCOSE-CAPILLARY: 147 mg/dL — AB (ref 70–99)
Glucose-Capillary: 51 mg/dL — ABNORMAL LOW (ref 70–99)
Glucose-Capillary: 72 mg/dL (ref 70–99)
Glucose-Capillary: 176 mg/dL — ABNORMAL HIGH (ref 70–99)

## 2013-10-25 MED ORDER — HYDRALAZINE HCL 10 MG PO TABS
10.0000 mg | ORAL_TABLET | Freq: Three times a day (TID) | ORAL | Status: DC
  Administered 2013-10-25 – 2013-10-31 (×14): 10 mg via ORAL
  Filled 2013-10-25 (×23): qty 1

## 2013-10-25 MED ORDER — HYDRALAZINE HCL 10 MG PO TABS
10.0000 mg | ORAL_TABLET | Freq: Three times a day (TID) | ORAL | Status: DC
  Administered 2013-10-25 (×2): 10 mg via ORAL
  Filled 2013-10-25 (×4): qty 1

## 2013-10-25 MED ORDER — INSULIN GLARGINE 100 UNIT/ML ~~LOC~~ SOLN
15.0000 [IU] | Freq: Every day | SUBCUTANEOUS | Status: DC
  Administered 2013-10-25 – 2013-10-30 (×6): 15 [IU] via SUBCUTANEOUS
  Filled 2013-10-25 (×7): qty 0.15

## 2013-10-25 NOTE — Progress Notes (Signed)
Hypoglycemic Event  CBG: 51  Treatment: 15 GM carbohydrate snack  Symptoms: None  Follow-up CBG: Time:8:36 CBG Result:72  Possible Reasons for Event: Inadequate meal intake  Comments/MD notified: Dr. Eliseo Squires notified    Nicklas Mcsweeney R  Remember to initiate Hypoglycemia Order Set & complete

## 2013-10-25 NOTE — Progress Notes (Signed)
Entered pt. Room.  Found IV tubing to be disconnected from IV and blood on the bed.  IV lines thrown in trash.  IV extension set cleaned, capped and clamped.  IV would not flush or get blood return.  IV team assisted with changing clotted extension set and flushing IV.  MD notified.  Jillyn Ledger, MBA, BS, RN

## 2013-10-25 NOTE — Progress Notes (Signed)
PROGRESS NOTE  Colin [name] [id number] DOB: [birth year] DOA: 10/19/2013 PCP: Horatio Pel, MD   Assessment/Plan:  Enterococcus Bacteremia/Pacemaker lead endocarditis:  TEE:  4 x 25 mm vegetation attached to the right atrial portion of the RV pacer lead. Seen by EP yesterday with plan for lead extraction on Monday 2/2 BC growing enterococcus sp.   - vanc/ceftriaxone  -Repeat BC.  -ID consult -for surgery Monday    CORONARY ARTERY DISEASE   Cardiac pacemaker in situ   Chronic systolic CHF (congestive heart failure): compensated   Peripheral vascular disease, unspecified  acute renal failue with CKD (chronic kidney disease), stage III. BUN down. Creat down. Saline lock   Falls at home, multiple.- SNF?   Type 2 diabetes, uncontrolled, with renal manifestation: adjust insulin   Weakness generalized protien calorie malnutrition troponins trending down and no CP. Likely from renal failure.  Code Status:  full Family Communication:  Wife on phone Disposition Plan:  SNF?  Consultants:  EP  cards  Procedures:     Antibiotics:  Rocephin  10/20/13 - 9/30  Vancomycin 9/30  HPI/Subjective: Episode of low Blood sugar this AM Some bleeding with IV site  Objective: Filed Vitals:   10/25/13 0421  BP: 140/63  Pulse: 74  Temp: 99.1 F (37.3 C)  Resp: 17    Intake/Output Summary (Last 24 hours) at 10/25/13 0842 Last data filed at 10/25/13 [id number]  Gross per 24 hour  Intake 2529.33 ml  Output    951 ml  Net 1578.33 ml   Filed Weights   10/22/13 2119 10/23/13 2125 10/24/13 2035  Weight: 68.7 kg (151 lb 7.3 oz) 73.9 kg (162 lb 14.7 oz) 75.1 kg (165 lb 9.1 oz)    Exam:   General:  Frail, elderly. comfortable  Cardiovascular: RRR without MGR  Respiratory: CTA without WRR  Abdomen: s, nt, nd  Ext: no CCE  Basic Metabolic Panel:  Recent Labs Lab 10/19/13 0651 10/19/13 1200 10/20/13 0532 10/21/13 0554 10/23/13 0745  NA 135*  --  135* 139  138  K 5.2  --  4.9 5.2 4.4  CL 98  --  104 109 108  CO2 21  --  21 20 21   GLUCOSE 214*  --  167* 108* 90  BUN 83*  --  68* 49* 31*  CREATININE 2.75* 2.51* 2.15* 1.80* 1.70*  CALCIUM 10.1  --  9.1 8.7 8.9   Liver Function Tests:  Recent Labs Lab 10/19/13 0651 10/20/13 0532  AST 24 20  ALT 18 14  ALKPHOS 84 65  BILITOT 0.7 0.3  PROT 9.4* 6.9  ALBUMIN 2.9* 2.2*   No results found for this basename: LIPASE, AMYLASE,  in the last 168 hours No results found for this basename: AMMONIA,  in the last 168 hours CBC:  Recent Labs Lab 10/19/13 0651 10/19/13 1200 10/20/13 0532 10/21/13 0554 10/23/13 0745  WBC 12.0* 17.6* 11.9* 9.0 5.9  NEUTROABS 9.8*  --   --  6.3  --   HGB 10.9* 9.1* 9.0* 9.0* 8.2*  HCT 34.5* 28.6* 28.7* 28.3* 26.2*  MCV 77.5* 76.3* 76.5* 76.3* 79.2  PLT 250 205 216 218 243   Cardiac Enzymes:  Recent Labs Lab 10/19/13 1200 10/19/13 1707 10/19/13 2208  TROPONINI 0.59* 0.39* <0.30   BNP (last 3 results)  Recent Labs  10/19/13 1200  PROBNP 2214.0*   CBG:  Recent Labs Lab 10/24/13 1218 10/24/13 1730 10/24/13 2121 10/25/13 0758 10/25/13 0836  GLUCAP 109* 166* 143* 51* 72  Recent Results (from the past 240 hour(s))  CULTURE, BLOOD (ROUTINE X 2)     Status: None   Collection Time    10/19/13  6:52 AM      Result Value Ref Range Status   Specimen Description BLOOD RIGHT ARM   Final   Special Requests BOTTLES DRAWN AEROBIC AND ANAEROBIC 5CC EA   Final   Culture  Setup Time     Final   Value: 10/19/2013 13:29     Performed at [facility]   Culture     Final   Value: ENTEROCOCCUS SPECIES     Note: SUSCEPTIBILITIES PERFORMED ON PREVIOUS CULTURE WITHIN THE LAST 5 DAYS.     Note: Gram Stain Report Called to,Read Back By and Verified With: JAY NARAMDAS @ [id number] ON [id number] BY [facility]     Performed at [facility]   Report Status 10/23/2013 FINAL   Final  CULTURE, BLOOD (ROUTINE X 2)     Status: None   Collection Time     10/19/13  6:58 AM      Result Value Ref Range Status   Specimen Description BLOOD LEFT ARM   Final   Special Requests BOTTLES DRAWN AEROBIC ONLY 2CC   Final   Culture  Setup Time     Final   Value: 10/19/2013 13:29     Performed at [facility]   Culture     Final   Value: ENTEROCOCCUS SPECIES     Note: Gram Stain Report Called to,Read Back By and Verified With: KAMI MOORE ON 10/20/2013 AT 10:25P BY WILEJ     Performed at [facility]   Report Status 10/23/2013 FINAL   Final   Organism ID, Bacteria ENTEROCOCCUS SPECIES   Final  URINE CULTURE     Status: None   Collection Time    10/19/13  9:12 AM      Result Value Ref Range Status   Specimen Description URINE, RANDOM   Final   Special Requests NONE   Final   Culture  Setup Time     Final   Value: 10/19/2013 17:01     Performed at [facility]     Final   Value: >=100,000 COLONIES/ML     Performed at [facility]   Culture     Final   Value: Multiple bacterial morphotypes present, none predominant. Suggest appropriate recollection if clinically indicated.     Performed at [facility]   Report Status 10/20/2013 FINAL   Final  CULTURE, BLOOD (ROUTINE X 2)     Status: None   Collection Time    10/23/13  7:40 AM      Result Value Ref Range Status   Specimen Description BLOOD LEFT HAND   Final   Special Requests BOTTLES DRAWN AEROBIC ONLY 3CC   Final   Culture  Setup Time     Final   Value: 10/23/2013 12:54     Performed at [facility]   Culture     Final   Value:        BLOOD CULTURE RECEIVED NO GROWTH TO DATE CULTURE WILL BE HELD FOR 5 DAYS BEFORE ISSUING A FINAL NEGATIVE REPORT     Performed at [facility]   Report Status PENDING   Incomplete  CULTURE, BLOOD (ROUTINE X 2)     Status: None   Collection Time    10/23/13  7:45 AM  Result Value Ref Range Status   Specimen Description BLOOD LEFT HAND   Final   Special Requests BOTTLES DRAWN AEROBIC  ONLY 4CC   Final   Culture  Setup Time     Final   Value: 10/23/2013 12:54     Performed at [facility]   Culture     Final   Value:        BLOOD CULTURE RECEIVED NO GROWTH TO DATE CULTURE WILL BE HELD FOR 5 DAYS BEFORE ISSUING A FINAL NEGATIVE REPORT     Performed at [facility]   Report Status PENDING   Incomplete     Studies: No results found. Echo Left ventricle: The cavity size was normal. Wall thickness was increased in a pattern of mild LVH. Systolic function was moderately to severely reduced. The estimated ejection fraction was in the range of 30% to 35%. Diffuse hypokinesis. Doppler parameters are consistent with abnormal left ventricular relaxation (grade 1 diastolic dysfunction). - Ventricular septum: Septal motion showed abnormal function and dyssynergy. - Aortic valve: Trileaflet; moderately calcified leaflets. There was trivial regurgitation. - Mitral valve: There was trivial regurgitation. - Left atrium: The atrium was moderately dilated. - Right ventricle: Pacer wire or catheter noted in right ventricle. Systolic function was reduced. - Right atrium: The atrium was mildly dilated. Pacer wire or catheter noted in right atrium. Central venous pressure (est): 15 mm Hg. - Atrial septum: No defect or patent foramen ovale was identified. - Tricuspid valve: Mildly thickened leaflets. There was mild regurgitation. - Pulmonary arteries: PA peak pressure: 39 mm Hg (S). - Pericardium, extracardiac: There was no pericardial effusion.  Impressions:  - Mild LVH with LVEF approximately 30-35%, septal dyssynergy, grade 1 diastolic dysfunction. Sclerotic aortic valve with trivial aortic regurgitation. Device wire noted in right heart. Reduced RV contraction. Thickened tricuspid valve with mild tricuspid regurgitation and PASP 39 mmHg. No obvious vegetations noted - shadowing in region of tricuspid valve likely secondary to device wire. If high suspicion  of endocarditis, TEE could be considered.   Scheduled Meds: . ampicillin (OMNIPEN) IV  2 g Intravenous 4 times per day  . aspirin EC  81 mg Oral Daily  . carvedilol  6.25 mg Oral BID WC  . cefTRIAXone (ROCEPHIN)  IV  2 g Intravenous Q12H  . docusate sodium  100 mg Oral BID  . enoxaparin (LOVENOX) injection  40 mg Subcutaneous Q24H  . ezetimibe  10 mg Oral Daily  . furosemide  40 mg Oral Daily  . hydrALAZINE  10 mg Oral 3 times per day  . insulin aspart  0-15 Units Subcutaneous TID WC  . insulin aspart  0-5 Units Subcutaneous QHS  . insulin glargine  25 Units Subcutaneous QHS  . isosorbide mononitrate  30 mg Oral Daily  . linagliptin  5 mg Oral Daily  . pantoprazole  40 mg Oral Daily  . sodium chloride  3 mL Intravenous Q12H  . vitamin B-12  1,000 mcg Oral Daily   Continuous Infusions: . sodium chloride 20 mL/hr (10/25/13 0535)    Time spent: 25 minutes  Eliseo Squires Annalicia Renfrew  [facility] Pager [phone]. If 7PM-7AM, please contact night-coverage at [email or web address], password [facility] 10/25/2013, 8:42 AM  LOS: 6 days

## 2013-10-25 NOTE — Progress Notes (Signed)
SUBJECTIVE: The patient is doing well today.  At this time, he denies chest pain, shortness of breath, or any new concerns.  Pending PPM system extraction on Monday.   CURRENT MEDICATIONS: . ampicillin (OMNIPEN) IV  2 g Intravenous 4 times per day  . aspirin EC  81 mg Oral Daily  . carvedilol  6.25 mg Oral BID WC  . cefTRIAXone (ROCEPHIN)  IV  2 g Intravenous Q12H  . docusate sodium  100 mg Oral BID  . enoxaparin (LOVENOX) injection  40 mg Subcutaneous Q24H  . ezetimibe  10 mg Oral Daily  . furosemide  40 mg Oral Daily  . insulin aspart  0-15 Units Subcutaneous TID WC  . insulin aspart  0-5 Units Subcutaneous QHS  . insulin glargine  25 Units Subcutaneous QHS  . isosorbide mononitrate  30 mg Oral Daily  . linagliptin  5 mg Oral Daily  . pantoprazole  40 mg Oral Daily  . sodium chloride  3 mL Intravenous Q12H  . vitamin B-12  1,000 mcg Oral Daily   . sodium chloride 20 mL/hr (10/25/13 0535)    OBJECTIVE: Physical Exam: Filed Vitals:   10/24/13 0950 10/24/13 1700 10/24/13 2035 10/25/13 0421  BP: 144/56 170/60 132/52 140/63  Pulse: 63 65 71 74  Temp: 99.5 F (37.5 C) 98.4 F (36.9 C) 99.1 F (37.3 C) 99.1 F (37.3 C)  TempSrc:  Oral Oral Oral  Resp: 16 18 16 17   Height:      Weight:   165 lb 9.1 oz (75.1 kg)   SpO2: 98% 98% 98% 98%    Intake/Output Summary (Last 24 hours) at 10/25/13 0741 Last data filed at 10/25/13 [id number]  Gross per 24 hour  Intake 2529.33 ml  Output    951 ml  Net 1578.33 ml    GEN- The patient is well appearing, alert and oriented x 3 today.   Head- normocephalic, atraumatic Eyes-  Sclera clear, conjunctiva pink Ears- hearing intact Oropharynx- clear Neck- supple, no JVP Lymph- no cervical lymphadenopathy Lungs- Clear to ausculation bilaterally, normal work of breathing Heart- Regular rate and rhythm, no murmurs, rubs or gallops, PMI not laterally displaced GI- soft, NT, ND, + BS Extremities- no clubbing, cyanosis, or edema Skin- no  rash or lesion Psych- euthymic mood, full affect Neuro- strength and sensation are intact  LABS: Basic Metabolic Panel:  Recent Labs  10/23/13 0745  NA 138  K 4.4  CL 108  CO2 21  GLUCOSE 90  BUN 31*  CREATININE 1.70*  CALCIUM 8.9   CBC:  Recent Labs  10/23/13 0745  WBC 5.9  HGB 8.2*  HCT 26.2*  MCV 79.2  PLT 243   RADIOLOGY: Dg Chest 2 View 10/19/2013   CLINICAL DATA:  Fever, confusion and fall.  EXAM: CHEST  2 VIEW  COMPARISON:  04/30/2013  FINDINGS: Stable appearance of the right dual lead cardiac pacemaker. Heart and mediastinum are stable. Post CABG changes in the chest. There is mild elevation of the posterior hemidiaphragms bilaterally and this is similar to the prior examination. No evidence for airspace disease or pleural effusions.  IMPRESSION: No acute cardiopulmonary disease.   Electronically Signed   By: Markus Daft M.D.   On: 10/19/2013 07:59    ASSESSMENT AND PLAN:  Principal Problem:   Bacteremia Active Problems:   CORONARY ARTERY DISEASE   Cardiac pacemaker in situ   Chronic systolic CHF (congestive heart failure)   Peripheral vascular disease, unspecified   CKD (chronic  kidney disease), stage III   Fall   Type 2 diabetes, uncontrolled, with renal manifestation   Weakness generalized Plan - PPM system extraction on Monday morning. Orders will be written tomorrow.  Mikle Bosworth.D.

## 2013-10-25 NOTE — Progress Notes (Signed)
Patient ID: Colin [name], male   DOB: 1928-05-09, 80 y.o.   MRN: [id number]      Subjective:    No complaints this AM  Objective:   Temp:  [98.4 F (36.9 C)-99.5 F (37.5 C)] 99.1 F (37.3 C) (10/03 0421) Pulse Rate:  [63-74] 74 (10/03 0421) Resp:  [16-18] 17 (10/03 0421) BP: (132-170)/(52-63) 140/63 mmHg (10/03 0421) SpO2:  [98 %] 98 % (10/03 0421) Weight:  [165 lb 9.1 oz (75.1 kg)] 165 lb 9.1 oz (75.1 kg) (10/02 2035) Last BM Date: 10/19/13  Filed Weights   10/22/13 2119 10/23/13 2125 10/24/13 2035  Weight: 151 lb 7.3 oz (68.7 kg) 162 lb 14.7 oz (73.9 kg) 165 lb 9.1 oz (75.1 kg)    Intake/Output Summary (Last 24 hours) at 10/25/13 0806 Last data filed at 10/25/13 [id number]  Gross per 24 hour  Intake 2529.33 ml  Output    951 ml  Net 1578.33 ml    Exam:  General: NAD  HEENT: sclera clear  Resp: CTAB  Cardiac: RRR, no m/r/g, no JVD  GI: abdomen soft, NT, ND  MSK: no LE edema  Neuro: no focal deficits  Psych: appropriate affect  Lab Results:  Basic Metabolic Panel:  Recent Labs Lab 10/20/13 0532 10/21/13 0554 10/23/13 0745  NA 135* 139 138  K 4.9 5.2 4.4  CL 104 109 108  CO2 21 20 21   GLUCOSE 167* 108* 90  BUN 68* 49* 31*  CREATININE 2.15* 1.80* 1.70*  CALCIUM 9.1 8.7 8.9    Liver Function Tests:  Recent Labs Lab 10/19/13 0651 10/20/13 0532  AST 24 20  ALT 18 14  ALKPHOS 84 65  BILITOT 0.7 0.3  PROT 9.4* 6.9  ALBUMIN 2.9* 2.2*    CBC:  Recent Labs Lab 10/20/13 0532 10/21/13 0554 10/23/13 0745  WBC 11.9* 9.0 5.9  HGB 9.0* 9.0* 8.2*  HCT 28.7* 28.3* 26.2*  MCV 76.5* 76.3* 79.2  PLT 216 218 243    Cardiac Enzymes:  Recent Labs Lab 10/19/13 1200 10/19/13 1707 10/19/13 2208  TROPONINI 0.59* 0.39* <0.30    BNP:  Recent Labs  10/19/13 1200  PROBNP 2214.0*    Coagulation: No results found for this basename: INR,  in the last 168 hours  ECG:   Medications:   Scheduled Medications: . ampicillin (OMNIPEN)  IV  2 g Intravenous 4 times per day  . aspirin EC  81 mg Oral Daily  . carvedilol  6.25 mg Oral BID WC  . cefTRIAXone (ROCEPHIN)  IV  2 g Intravenous Q12H  . docusate sodium  100 mg Oral BID  . enoxaparin (LOVENOX) injection  40 mg Subcutaneous Q24H  . ezetimibe  10 mg Oral Daily  . furosemide  40 mg Oral Daily  . insulin aspart  0-15 Units Subcutaneous TID WC  . insulin aspart  0-5 Units Subcutaneous QHS  . insulin glargine  25 Units Subcutaneous QHS  . isosorbide mononitrate  30 mg Oral Daily  . linagliptin  5 mg Oral Daily  . pantoprazole  40 mg Oral Daily  . sodium chloride  3 mL Intravenous Q12H  . vitamin B-12  1,000 mcg Oral Daily     Infusions: . sodium chloride 20 mL/hr (10/25/13 0535)     PRN Medications:  acetaminophen, acetaminophen, levalbuterol, ondansetron (ZOFRAN) IV, ondansetron     Assessment/Plan    1. Enterococcus Bacteremia/Pacemaker lead endocarditis: With 4 x 25 mm vegetation attached to the right atrial portion of the RV pacer  lead. Seen by EP with plans for lead extraction on Monday. Abx per IM/ID.   2. CAD: No chest pain or sob.  - continue medical therapy.   3. Chronic systolic CHF - echo [id number] LVEF 30-35% - he is on coreg, no ACE-I due to CKD. On nitrates, african american with systolic dysfunction and will start hydralazine.  - on oral lasix 40mg  daily, from I/Os patient + 1. 5 liters yesterday. No labs today but Cr has been trending down. Baseline Cr 2-2.4 from prior labs, down to 1.7 today. Wt trending up to 165 lbs. Oral lasix was restarted yesterday. No symptoms at this time, by exam does not appear volume overloaded.   4. CKD III: - Cr has been trending down, restarted lasix yesterday. Continue to follow.          Carlyle Dolly, M.D.

## 2013-10-26 LAB — PREPARE RBC (CROSSMATCH)

## 2013-10-26 LAB — BASIC METABOLIC PANEL
Anion gap: 9 (ref 5–15)
BUN: 32 mg/dL — AB (ref 6–23)
CHLORIDE: 103 meq/L (ref 96–112)
CO2: 23 mEq/L (ref 19–32)
Calcium: 8.5 mg/dL (ref 8.4–10.5)
Creatinine, Ser: 2.06 mg/dL — ABNORMAL HIGH (ref 0.50–1.35)
GFR calc Af Amer: 33 mL/min — ABNORMAL LOW (ref >90)
GFR calc non Af Amer: 28 mL/min — ABNORMAL LOW (ref >90)
GLUCOSE: 88 mg/dL (ref 70–99)
POTASSIUM: 4.4 meq/L (ref 3.7–5.3)
Sodium: 135 mEq/L — ABNORMAL LOW (ref 137–147)

## 2013-10-26 LAB — CBC WITH DIFFERENTIAL/PLATELET
Basophils Absolute: 0 10*3/uL (ref 0.0–0.1)
Basophils Relative: 0 % (ref 0–1)
EOS ABS: 0.2 10*3/uL (ref 0.0–0.7)
EOS PCT: 3 % (ref 0–5)
HCT: 21.9 % — ABNORMAL LOW (ref 39.0–52.0)
Hemoglobin: 6.8 g/dL — CL (ref 13.0–17.0)
LYMPHS ABS: 1.3 10*3/uL (ref 0.7–4.0)
LYMPHS PCT: 23 % (ref 12–46)
MCH: 24.9 pg — AB (ref 26.0–34.0)
MCHC: 31.1 g/dL (ref 30.0–36.0)
MCV: 80.2 fL (ref 78.0–100.0)
MONOS PCT: 14 % — AB (ref 3–12)
Monocytes Absolute: 0.8 10*3/uL (ref 0.1–1.0)
Neutro Abs: 3.4 10*3/uL (ref 1.7–7.7)
Neutrophils Relative %: 60 % (ref 43–77)
Platelets: 251 10*3/uL (ref 150–400)
RBC: 2.73 MIL/uL — AB (ref 4.22–5.81)
RDW: 17.2 % — ABNORMAL HIGH (ref 11.5–15.5)
WBC: 5.7 10*3/uL (ref 4.0–10.5)

## 2013-10-26 LAB — GLUCOSE, CAPILLARY
GLUCOSE-CAPILLARY: 73 mg/dL (ref 70–99)
GLUCOSE-CAPILLARY: 115 mg/dL — AB (ref 70–99)
Glucose-Capillary: 167 mg/dL — ABNORMAL HIGH (ref 70–99)
Glucose-Capillary: 185 mg/dL — ABNORMAL HIGH (ref 70–99)

## 2013-10-26 MED ORDER — SODIUM CHLORIDE 0.9 % IR SOLN
80.0000 mg | Status: DC
  Filled 2013-10-26: qty 2

## 2013-10-26 MED ORDER — SODIUM CHLORIDE 0.9 % IV SOLN
INTRAVENOUS | Status: DC
  Administered 2013-10-27: 50 mL/h via INTRAVENOUS

## 2013-10-26 MED ORDER — SODIUM CHLORIDE 0.9 % IV SOLN: Freq: Once | INTRAVENOUS | Status: DC

## 2013-10-26 MED ORDER — CHLORHEXIDINE GLUCONATE 4 % EX LIQD
60.0000 mL | Freq: Once | CUTANEOUS | Status: AC
  Administered 2013-10-27: 4 via TOPICAL
  Filled 2013-10-26: qty 60

## 2013-10-26 MED ORDER — CHLORHEXIDINE GLUCONATE 4 % EX LIQD
60.0000 mL | Freq: Once | CUTANEOUS | Status: AC
  Administered 2013-10-26: 4 via TOPICAL
  Filled 2013-10-26: qty 60

## 2013-10-26 NOTE — Progress Notes (Signed)
CRITICAL VALUE ALERT  Critical value received: Hemoglobin - 6.8  Date of notification:  10/26/2013  Time of notification:  0622  Critical value read back:Yes.    Nurse who received alert:  Georgeanna Harrison, RN  MD notified (1st page):  M. Lynch  Time of first page:  0625  MD notified (2nd page):  Time of second page:  Responding MD:  M. Donnal Debar  Time MD responded:  [phone]

## 2013-10-26 NOTE — Progress Notes (Signed)
PROGRESS NOTE  Colin [name] [id number] DOB: Jul 12, 1928 DOA: 10/19/2013 PCP: Horatio Pel, MD   Assessment/Plan:  Enterococcus Bacteremia/Pacemaker lead endocarditis:  TEE:  4 x 25 mm vegetation attached to the right atrial portion of the RV pacer lead. Seen by EP yesterday with plan for lead extraction on Monday 2/2 BC growing enterococcus sp.   - vanc/ceftriaxone  -Repeat BC.  -ID consult -for surgery Monday    CORONARY ARTERY DISEASE   Cardiac pacemaker in situ   Chronic systolic CHF (congestive heart failure): compensated   Peripheral vascular disease, unspecified  acute renal failue with CKD (chronic kidney disease), stage III. BUN down. Creat down. Saline lock   Falls at home, multiple.- SNF?   Type 2 diabetes, uncontrolled, with renal manifestation: adjust insulin   Weakness generalized protien calorie malnutrition troponins trending down and no CP. Likely from renal failure.  Anemia- suspected related to bleed with IV yesterday- give PRBCs today  Code Status:  full Family Communication:  Wife on phone yest Disposition Plan:  SNF?  Consultants:  EP  cards  Procedures:     Antibiotics:  Rocephin  10/20/13 - 9/30  Vancomycin 9/30  HPI/Subjective: No SOB, no CP Ate a;; his breakfast  Objective: Filed Vitals:   10/26/13 0814  BP: 151/52  Pulse:   Temp: 98.2 F (36.8 C)  Resp: 18    Intake/Output Summary (Last 24 hours) at 10/26/13 0854 Last data filed at 10/26/13 0659  Gross per 24 hour  Intake    880 ml  Output   1500 ml  Net   -620 ml   Filed Weights   10/23/13 2125 10/24/13 2035 10/25/13 2129  Weight: 73.9 kg (162 lb 14.7 oz) 75.1 kg (165 lb 9.1 oz) 71.85 kg (158 lb 6.4 oz)    Exam:   General:  Frail, elderly. comfortable  Cardiovascular: RRR without MGR  Respiratory: CTA without WRR  Abdomen: s, nt, nd  Ext: no CCE  Basic Metabolic Panel:  Recent Labs Lab 10/19/13 1200 10/20/13 0532 10/21/13 0554  10/23/13 0745 10/26/13 0505  NA  --  135* 139 138 135*  K  --  4.9 5.2 4.4 4.4  CL  --  104 109 108 103  CO2  --  21 20 21 23   GLUCOSE  --  167* 108* 90 88  BUN  --  68* 49* 31* 32*  CREATININE 2.51* 2.15* 1.80* 1.70* 2.06*  CALCIUM  --  9.1 8.7 8.9 8.5   Liver Function Tests:  Recent Labs Lab 10/20/13 0532  AST 20  ALT 14  ALKPHOS 65  BILITOT 0.3  PROT 6.9  ALBUMIN 2.2*   No results found for this basename: LIPASE, AMYLASE,  in the last 168 hours No results found for this basename: AMMONIA,  in the last 168 hours CBC:  Recent Labs Lab 10/19/13 1200 10/20/13 0532 10/21/13 0554 10/23/13 0745 10/26/13 0505  WBC 17.6* 11.9* 9.0 5.9 5.7  NEUTROABS  --   --  6.3  --  3.4  HGB 9.1* 9.0* 9.0* 8.2* 6.8*  HCT 28.6* 28.7* 28.3* 26.2* 21.9*  MCV 76.3* 76.5* 76.3* 79.2 80.2  PLT 205 216 218 243 251   Cardiac Enzymes:  Recent Labs Lab 10/19/13 1200 10/19/13 1707 10/19/13 2208  TROPONINI 0.59* 0.39* <0.30   BNP (last 3 results)  Recent Labs  10/19/13 1200  PROBNP 2214.0*   CBG:  Recent Labs Lab 10/24/13 2121 10/25/13 0758 10/25/13 0836 10/25/13 1633 10/25/13 2126  GLUCAP  143* 51* 72 176* 147*    Recent Results (from the past 240 hour(s))  CULTURE, BLOOD (ROUTINE X 2)     Status: None   Collection Time    10/19/13  6:52 AM      Result Value Ref Range Status   Specimen Description BLOOD RIGHT ARM   Final   Special Requests BOTTLES DRAWN AEROBIC AND ANAEROBIC 5CC EA   Final   Culture  Setup Time     Final   Value: 10/19/2013 13:29     Performed at [facility]   Culture     Final   Value: ENTEROCOCCUS SPECIES     Note: SUSCEPTIBILITIES PERFORMED ON PREVIOUS CULTURE WITHIN THE LAST 5 DAYS.     Note: Gram Stain Report Called to,Read Back By and Verified With: JAY NARAMDAS @ [id number] ON [id number] BY [facility]     Performed at [facility]   Report Status 10/23/2013 FINAL   Final  CULTURE, BLOOD (ROUTINE X 2)     Status: None   Collection Time     10/19/13  6:58 AM      Result Value Ref Range Status   Specimen Description BLOOD LEFT ARM   Final   Special Requests BOTTLES DRAWN AEROBIC ONLY 2CC   Final   Culture  Setup Time     Final   Value: 10/19/2013 13:29     Performed at [facility]   Culture     Final   Value: ENTEROCOCCUS SPECIES     Note: Gram Stain Report Called to,Read Back By and Verified With: KAMI MOORE ON 10/20/2013 AT 10:25P BY [facility]     Performed at [facility]   Report Status 10/23/2013 FINAL   Final   Organism ID, Bacteria ENTEROCOCCUS SPECIES   Final  URINE CULTURE     Status: None   Collection Time    10/19/13  9:12 AM      Result Value Ref Range Status   Specimen Description URINE, RANDOM   Final   Special Requests NONE   Final   Culture  Setup Time     Final   Value: 10/19/2013 17:01     Performed at [place]     Final   Value: >=100,000 COLONIES/ML     Performed at [facility]   Culture     Final   Value: Multiple bacterial morphotypes present, none predominant. Suggest appropriate recollection if clinically indicated.     Performed at [facility]   Report Status 10/20/2013 FINAL   Final  CULTURE, BLOOD (ROUTINE X 2)     Status: None   Collection Time    10/23/13  7:40 AM      Result Value Ref Range Status   Specimen Description BLOOD LEFT HAND   Final   Special Requests BOTTLES DRAWN AEROBIC ONLY 3CC   Final   Culture  Setup Time     Final   Value: 10/23/2013 12:54     Performed at [facility]   Culture     Final   Value:        BLOOD CULTURE RECEIVED NO GROWTH TO DATE CULTURE WILL BE HELD FOR 5 DAYS BEFORE ISSUING A FINAL NEGATIVE REPORT     Performed at [facility]   Report Status PENDING   Incomplete  CULTURE, BLOOD (ROUTINE X 2)     Status: None   Collection Time  10/23/13  7:45 AM      Result Value Ref Range Status   Specimen Description BLOOD LEFT HAND   Final   Special Requests BOTTLES DRAWN AEROBIC  ONLY 4CC   Final   Culture  Setup Time     Final   Value: 10/23/2013 12:54     Performed at [facility]   Culture     Final   Value:        BLOOD CULTURE RECEIVED NO GROWTH TO DATE CULTURE WILL BE HELD FOR 5 DAYS BEFORE ISSUING A FINAL NEGATIVE REPORT     Performed at [facility]   Report Status PENDING   Incomplete     Studies: No results found. Echo Left ventricle: The cavity size was normal. Wall thickness was increased in a pattern of mild LVH. Systolic function was moderately to severely reduced. The estimated ejection fraction was in the range of 30% to 35%. Diffuse hypokinesis. Doppler parameters are consistent with abnormal left ventricular relaxation (grade 1 diastolic dysfunction). - Ventricular septum: Septal motion showed abnormal function and dyssynergy. - Aortic valve: Trileaflet; moderately calcified leaflets. There was trivial regurgitation. - Mitral valve: There was trivial regurgitation. - Left atrium: The atrium was moderately dilated. - Right ventricle: Pacer wire or catheter noted in right ventricle. Systolic function was reduced. - Right atrium: The atrium was mildly dilated. Pacer wire or catheter noted in right atrium. Central venous pressure (est): 15 mm Hg. - Atrial septum: No defect or patent foramen ovale was identified. - Tricuspid valve: Mildly thickened leaflets. There was mild regurgitation. - Pulmonary arteries: PA peak pressure: 39 mm Hg (S). - Pericardium, extracardiac: There was no pericardial effusion.  Impressions:  - Mild LVH with LVEF approximately 30-35%, septal dyssynergy, grade 1 diastolic dysfunction. Sclerotic aortic valve with trivial aortic regurgitation. Device wire noted in right heart. Reduced RV contraction. Thickened tricuspid valve with mild tricuspid regurgitation and PASP 39 mmHg. No obvious vegetations noted - shadowing in region of tricuspid valve likely secondary to device wire. If high suspicion  of endocarditis, TEE could be considered.   Scheduled Meds: . sodium chloride   Intravenous Once  . ampicillin (OMNIPEN) IV  2 g Intravenous 4 times per day  . aspirin EC  81 mg Oral Daily  . carvedilol  6.25 mg Oral BID WC  . cefTRIAXone (ROCEPHIN)  IV  2 g Intravenous Q12H  . docusate sodium  100 mg Oral BID  . ezetimibe  10 mg Oral Daily  . furosemide  40 mg Oral Daily  . hydrALAZINE  10 mg Oral 3 times per day  . insulin aspart  0-15 Units Subcutaneous TID WC  . insulin aspart  0-5 Units Subcutaneous QHS  . insulin glargine  15 Units Subcutaneous QHS  . isosorbide mononitrate  30 mg Oral Daily  . linagliptin  5 mg Oral Daily  . pantoprazole  40 mg Oral Daily  . sodium chloride  3 mL Intravenous Q12H  . vitamin B-12  1,000 mcg Oral Daily   Continuous Infusions: . sodium chloride 20 mL/hr at 10/25/13 1202    Time spent: 25 minutes  Eulogio Bear  [facility] Pager [phone]. If 7PM-7AM, please contact night-coverage at [email or web address], password [facility] 10/26/2013, 8:54 AM  LOS: 7 days

## 2013-10-26 NOTE — Progress Notes (Signed)
Patient ID: Colin [name], male   DOB: 05-17-1928, 80 y.o.   MRN: [id number]    Subjective:   No complaints this AM   Objective:   Temp:  [97.9 F (36.6 C)-99 F (37.2 C)] 98.5 F (36.9 C) (10/04 0714) Pulse Rate:  [54-124] 65 (10/04 0714) Resp:  [17-18] 18 (10/04 0714) BP: (109-144)/(41-55) 141/44 mmHg (10/04 0714) SpO2:  [95 %-100 %] 98 % (10/04 0714) Weight:  [158 lb 6.4 oz (71.85 kg)] 158 lb 6.4 oz (71.85 kg) (10/03 2129) Last BM Date: 10/25/13  Filed Weights   10/23/13 2125 10/24/13 2035 10/25/13 2129  Weight: 162 lb 14.7 oz (73.9 kg) 165 lb 9.1 oz (75.1 kg) 158 lb 6.4 oz (71.85 kg)    Intake/Output Summary (Last 24 hours) at 10/26/13 0746 Last data filed at 10/26/13 0659  Gross per 24 hour  Intake    880 ml  Output   1500 ml  Net   -620 ml    Telemetry: none  Exam:  General: NAD  Resp: CTAB  Cardiac: RRR, no m/r/g, no JVD, no carotid bruits  [id number] soft, NT, ND  MSK: no LE edema  Neuro: no focal deficits  Psych: appropriate affect  Lab Results:  Basic Metabolic Panel:  Recent Labs Lab 10/21/13 0554 10/23/13 0745 10/26/13 0505  NA 139 138 135*  K 5.2 4.4 4.4  CL 109 108 103  CO2 20 21 23   GLUCOSE 108* 90 88  BUN 49* 31* 32*  CREATININE 1.80* 1.70* 2.06*  CALCIUM 8.7 8.9 8.5    Liver Function Tests:  Recent Labs Lab 10/20/13 0532  AST 20  ALT 14  ALKPHOS 65  BILITOT 0.3  PROT 6.9  ALBUMIN 2.2*    CBC:  Recent Labs Lab 10/21/13 0554 10/23/13 0745 10/26/13 0505  WBC 9.0 5.9 5.7  HGB 9.0* 8.2* 6.8*  HCT 28.3* 26.2* 21.9*  MCV 76.3* 79.2 80.2  PLT 218 243 251    Cardiac Enzymes:  Recent Labs Lab 10/19/13 1200 10/19/13 1707 10/19/13 2208  TROPONINI 0.59* 0.39* <0.30    BNP:  Recent Labs  10/19/13 1200  PROBNP 2214.0*    Coagulation: No results found for this basename: INR,  in the last 168 hours  ECG:   Medications:   Scheduled Medications: . sodium chloride   Intravenous Once  . ampicillin  (OMNIPEN) IV  2 g Intravenous 4 times per day  . aspirin EC  81 mg Oral Daily  . carvedilol  6.25 mg Oral BID WC  . cefTRIAXone (ROCEPHIN)  IV  2 g Intravenous Q12H  . docusate sodium  100 mg Oral BID  . ezetimibe  10 mg Oral Daily  . furosemide  40 mg Oral Daily  . hydrALAZINE  10 mg Oral 3 times per day  . insulin aspart  0-15 Units Subcutaneous TID WC  . insulin aspart  0-5 Units Subcutaneous QHS  . insulin glargine  15 Units Subcutaneous QHS  . isosorbide mononitrate  30 mg Oral Daily  . linagliptin  5 mg Oral Daily  . pantoprazole  40 mg Oral Daily  . sodium chloride  3 mL Intravenous Q12H  . vitamin B-12  1,000 mcg Oral Daily     Infusions: . sodium chloride 20 mL/hr at 10/25/13 1202     PRN Medications:  acetaminophen, acetaminophen, levalbuterol, ondansetron (ZOFRAN) IV, ondansetron     Assessment/Plan    1. Enterococcus Bacteremia/Pacemaker lead endocarditis: With 4 x 25 mm vegetation attached to the right  atrial portion of the RV pacer lead. Seen by EP with plans for lead extraction on Monday. Abx per IM/ID.  - will make NPO tonight at midnight  2. CAD: No chest pain or sob.  - continue medical therapy.   3. Chronic systolic CHF  - echo [id number] LVEF 30-35%  - he is on coreg, no ACE-I due to CKD. He is on hdral/nitrates - on oral lasix 40mg  daily, from I/Os patient neg 620 mL yesterday.Baseline Cr 2-2.4 from prior labs, still within that range - No symptoms at this time, by exam does not appear volume overloaded.   4. CKD III:  - from past labs baseline appears to be between 2 to 2.4, mild uptrend yesterday but still within range. - continue to montior  5. Anemia - per primary team          Carlyle Dolly, M.D.

## 2013-10-27 ENCOUNTER — Inpatient Hospital Stay (HOSPITAL_COMMUNITY): Payer: Medicare Other | Admitting: Anesthesiology

## 2013-10-27 ENCOUNTER — Encounter (HOSPITAL_COMMUNITY)
Admission: EM | Disposition: A | Discharge status: Skilled Nursing Facility | Payer: Self-pay | Source: Home / Self Care | Attending: Internal Medicine

## 2013-10-27 ENCOUNTER — Encounter (HOSPITAL_COMMUNITY): Payer: Self-pay | Admitting: Anesthesiology

## 2013-10-27 ENCOUNTER — Encounter (HOSPITAL_COMMUNITY): Payer: Medicare Other | Admitting: Anesthesiology

## 2013-10-27 DIAGNOSIS — T827XXA Infection and inflammatory reaction due to other cardiac and vascular devices, implants and grafts, initial encounter: Secondary | ICD-10-CM

## 2013-10-27 HISTORY — PX: PACEMAKER LEAD REMOVAL: SHX5064

## 2013-10-27 LAB — CBC
HCT: 22.5 % — ABNORMAL LOW (ref 39.0–52.0)
HCT: 24.9 % — ABNORMAL LOW (ref 39.0–52.0)
HEMOGLOBIN: 7.9 g/dL — AB (ref 13.0–17.0)
Hemoglobin: 7.2 g/dL — ABNORMAL LOW (ref 13.0–17.0)
MCH: 24.7 pg — ABNORMAL LOW (ref 26.0–34.0)
MCH: 24.4 pg — ABNORMAL LOW (ref 26.0–34.0)
MCHC: 32 g/dL (ref 30.0–36.0)
MCHC: 31.7 g/dL (ref 30.0–36.0)
MCV: 77.3 fL — AB (ref 78.0–100.0)
MCV: 76.9 fL — ABNORMAL LOW (ref 78.0–100.0)
PLATELETS: 257 10*3/uL (ref 150–400)
Platelets: 256 10*3/uL (ref 150–400)
RBC: 2.91 MIL/uL — AB (ref 4.22–5.81)
RBC: 3.24 MIL/uL — ABNORMAL LOW (ref 4.22–5.81)
RDW: 17 % — ABNORMAL HIGH (ref 11.5–15.5)
RDW: 17 % — ABNORMAL HIGH (ref 11.5–15.5)
WBC: 4.9 10*3/uL (ref 4.0–10.5)
WBC: 6.7 10*3/uL (ref 4.0–10.5)

## 2013-10-27 LAB — BASIC METABOLIC PANEL
ANION GAP: 10 (ref 5–15)
BUN: 31 mg/dL — ABNORMAL HIGH (ref 6–23)
CALCIUM: 8.4 mg/dL (ref 8.4–10.5)
CO2: 23 mEq/L (ref 19–32)
Chloride: 105 mEq/L (ref 96–112)
Creatinine, Ser: 2.04 mg/dL — ABNORMAL HIGH (ref 0.50–1.35)
GFR calc Af Amer: 33 mL/min — ABNORMAL LOW (ref >90)
GFR, EST NON AFRICAN AMERICAN: 29 mL/min — AB (ref >90)
Glucose, Bld: 122 mg/dL — ABNORMAL HIGH (ref 70–99)
Potassium: 4.1 mEq/L (ref 3.7–5.3)
SODIUM: 138 meq/L (ref 137–147)

## 2013-10-27 LAB — POCT I-STAT 4, (NA,K, GLUC, HGB,HCT)
Glucose, Bld: 115 mg/dL — ABNORMAL HIGH (ref 70–99)
HEMATOCRIT: 21 % — AB (ref 39.0–52.0)
Hemoglobin: 7.1 g/dL — ABNORMAL LOW (ref 13.0–17.0)
Potassium: 4.3 mEq/L (ref 3.7–5.3)
Sodium: 138 mEq/L (ref 137–147)

## 2013-10-27 LAB — GLUCOSE, CAPILLARY
Glucose-Capillary: 109 mg/dL — ABNORMAL HIGH (ref 70–99)
Glucose-Capillary: 136 mg/dL — ABNORMAL HIGH (ref 70–99)
Glucose-Capillary: 149 mg/dL — ABNORMAL HIGH (ref 70–99)

## 2013-10-27 LAB — PREPARE RBC (CROSSMATCH)

## 2013-10-27 LAB — SURGICAL PCR SCREEN
MRSA, PCR: NEGATIVE
Staphylococcus aureus: NEGATIVE

## 2013-10-27 LAB — CREATININE, SERUM
CREATININE: 2.04 mg/dL — AB (ref 0.50–1.35)
GFR calc Af Amer: 33 mL/min — ABNORMAL LOW (ref >90)
GFR calc non Af Amer: 29 mL/min — ABNORMAL LOW (ref >90)

## 2013-10-27 SURGERY — REMOVAL, ELECTRODE LEAD, CARDIAC PACEMAKER, WITHOUT REPLACEMENT
Anesthesia: General | Site: Chest | Laterality: Right

## 2013-10-27 MED ORDER — GENTAMICIN SULFATE 40 MG/ML IJ SOLN
INTRAMUSCULAR | Status: AC
  Filled 2013-10-27: qty 2

## 2013-10-27 MED ORDER — NEOSTIGMINE METHYLSULFATE 10 MG/10ML IV SOLN
INTRAVENOUS | Status: AC
  Filled 2013-10-27: qty 1

## 2013-10-27 MED ORDER — SODIUM CHLORIDE 0.9 % IR SOLN
Status: DC | PRN
  Administered 2013-10-27: 09:00:00

## 2013-10-27 MED ORDER — LIDOCAINE HCL (PF) 1 % IJ SOLN
INTRAMUSCULAR | Status: AC
  Filled 2013-10-27: qty 30

## 2013-10-27 MED ORDER — HYDROMORPHONE HCL 1 MG/ML IJ SOLN: 0.2500 mg | INTRAMUSCULAR | Status: DC | PRN

## 2013-10-27 MED ORDER — LACTATED RINGERS IV SOLN
INTRAVENOUS | Status: DC | PRN
  Administered 2013-10-27: 07:00:00 via INTRAVENOUS

## 2013-10-27 MED ORDER — ONDANSETRON HCL 4 MG/2ML IJ SOLN
INTRAMUSCULAR | Status: AC
  Filled 2013-10-27: qty 2

## 2013-10-27 MED ORDER — SODIUM CHLORIDE 0.9 % IJ SOLN
INTRAMUSCULAR | Status: AC
  Filled 2013-10-27: qty 10

## 2013-10-27 MED ORDER — FENTANYL CITRATE 0.05 MG/ML IJ SOLN
INTRAMUSCULAR | Status: AC
  Filled 2013-10-27: qty 5

## 2013-10-27 MED ORDER — FENTANYL CITRATE 0.05 MG/ML IJ SOLN
INTRAMUSCULAR | Status: DC | PRN
  Administered 2013-10-27 (×2): 100 ug via INTRAVENOUS

## 2013-10-27 MED ORDER — ACETAMINOPHEN 325 MG PO TABS: 325.0000 mg | ORAL_TABLET | ORAL | Status: DC | PRN

## 2013-10-27 MED ORDER — PHENYLEPHRINE 40 MCG/ML (10ML) SYRINGE FOR IV PUSH (FOR BLOOD PRESSURE SUPPORT)
PREFILLED_SYRINGE | INTRAVENOUS | Status: AC
  Filled 2013-10-27: qty 10

## 2013-10-27 MED ORDER — HEPARIN SODIUM (PORCINE) 5000 UNIT/ML IJ SOLN
5000.0000 [IU] | Freq: Three times a day (TID) | INTRAMUSCULAR | Status: DC
  Administered 2013-10-28 – 2013-10-31 (×9): 5000 [IU] via SUBCUTANEOUS
  Filled 2013-10-27 (×12): qty 1

## 2013-10-27 MED ORDER — ONDANSETRON HCL 4 MG/2ML IJ SOLN: 4.0000 mg | Freq: Four times a day (QID) | INTRAMUSCULAR | Status: DC | PRN

## 2013-10-27 MED ORDER — ISOSORBIDE MONONITRATE ER 30 MG PO TB24
30.0000 mg | ORAL_TABLET | Freq: Every day | ORAL | Status: DC
  Administered 2013-10-29 – 2013-10-31 (×3): 30 mg via ORAL
  Filled 2013-10-27 (×4): qty 1

## 2013-10-27 MED ORDER — EPHEDRINE SULFATE 50 MG/ML IJ SOLN
INTRAMUSCULAR | Status: AC
  Filled 2013-10-27: qty 1

## 2013-10-27 MED ORDER — LIDOCAINE HCL (PF) 1 % IJ SOLN
INTRAMUSCULAR | Status: DC | PRN
  Administered 2013-10-27 (×2): 10 mL

## 2013-10-27 MED ORDER — ROCURONIUM BROMIDE 50 MG/5ML IV SOLN
INTRAVENOUS | Status: AC
  Filled 2013-10-27: qty 1

## 2013-10-27 MED ORDER — ROCURONIUM BROMIDE 100 MG/10ML IV SOLN
INTRAVENOUS | Status: DC | PRN
  Administered 2013-10-27: 50 mg via INTRAVENOUS

## 2013-10-27 MED ORDER — OXYCODONE HCL 5 MG/5ML PO SOLN: 5.0000 mg | Freq: Once | ORAL | Status: DC | PRN

## 2013-10-27 MED ORDER — OXYCODONE HCL 5 MG PO TABS: 5.0000 mg | ORAL_TABLET | Freq: Once | ORAL | Status: DC | PRN

## 2013-10-27 MED ORDER — PHENYLEPHRINE HCL 10 MG/ML IJ SOLN
10.0000 mg | INTRAVENOUS | Status: DC | PRN
  Administered 2013-10-27: 50 ug/min via INTRAVENOUS

## 2013-10-27 MED ORDER — DEXTROSE 5 % IV SOLN
2.0000 g | INTRAVENOUS | Status: DC
  Administered 2013-10-27 – 2013-10-28 (×2): 2 g via INTRAVENOUS
  Filled 2013-10-27 (×3): qty 2

## 2013-10-27 MED ORDER — GLYCOPYRROLATE 0.2 MG/ML IJ SOLN
INTRAMUSCULAR | Status: AC
  Filled 2013-10-27: qty 3

## 2013-10-27 MED ORDER — ONDANSETRON HCL 4 MG/2ML IJ SOLN
INTRAMUSCULAR | Status: DC | PRN
  Administered 2013-10-27: 4 mg via INTRAVENOUS

## 2013-10-27 MED ORDER — LIDOCAINE HCL (CARDIAC) 20 MG/ML IV SOLN
INTRAVENOUS | Status: AC
  Filled 2013-10-27: qty 5

## 2013-10-27 MED ORDER — ONDANSETRON HCL 4 MG/2ML IJ SOLN: 4.0000 mg | Freq: Once | INTRAMUSCULAR | Status: DC | PRN

## 2013-10-27 MED ORDER — SODIUM CHLORIDE 0.9 % IV SOLN: Freq: Once | INTRAVENOUS | Status: DC

## 2013-10-27 MED ORDER — NEOSTIGMINE METHYLSULFATE 10 MG/10ML IV SOLN
INTRAVENOUS | Status: DC | PRN
  Administered 2013-10-27: 3 mg via INTRAVENOUS

## 2013-10-27 MED ORDER — PROPOFOL 10 MG/ML IV BOLUS
INTRAVENOUS | Status: AC
  Filled 2013-10-27: qty 20

## 2013-10-27 MED ORDER — ETOMIDATE 2 MG/ML IV SOLN
INTRAVENOUS | Status: AC
  Filled 2013-10-27: qty 10

## 2013-10-27 MED ORDER — MEPERIDINE HCL 25 MG/ML IJ SOLN: 6.2500 mg | INTRAMUSCULAR | Status: DC | PRN

## 2013-10-27 MED ORDER — GLYCOPYRROLATE 0.2 MG/ML IJ SOLN
INTRAMUSCULAR | Status: DC | PRN
  Administered 2013-10-27: 0.4 mg via INTRAVENOUS
  Administered 2013-10-27: 0.2 mg via INTRAVENOUS

## 2013-10-27 MED ORDER — ETOMIDATE 2 MG/ML IV SOLN
INTRAVENOUS | Status: DC | PRN
  Administered 2013-10-27: 14 mg via INTRAVENOUS

## 2013-10-27 MED ORDER — EPHEDRINE SULFATE 50 MG/ML IJ SOLN
INTRAMUSCULAR | Status: DC | PRN
  Administered 2013-10-27: 10 mg via INTRAVENOUS

## 2013-10-27 SURGICAL SUPPLY — 51 items
ANGIOGRAPHIC GUIDEWIRE 3MM J 0.035IN 145CM ×1 IMPLANT
BAG BANDED W/RUBBER/TAPE 36X54 (MISCELLANEOUS) ×2 IMPLANT
BAG EQP BAND 135X91 W/RBR TAPE (MISCELLANEOUS) ×1
BLADE 10 SAFETY STRL DISP (BLADE) ×2 IMPLANT
BLADE STERNUM SYSTEM 6 (BLADE) ×2 IMPLANT
BNDG ADH 5X4 AIR PERM ELC (GAUZE/BANDAGES/DRESSINGS)
BNDG COHESIVE 4X5 WHT NS (GAUZE/BANDAGES/DRESSINGS) IMPLANT
CANISTER SUCTION 2500CC (MISCELLANEOUS) ×2 IMPLANT
COVER LIGHT HANDLE  DEROYL (MISCELLANEOUS) ×1 IMPLANT
COVER TABLE BACK 60X90 (DRAPES) ×2 IMPLANT
DRAPE CARDIOVASCULAR INCISE (DRAPES) ×2
DRAPE SRG 135X102X78XABS (DRAPES) ×1 IMPLANT
DRSG OPSITE 6X11 MED (GAUZE/BANDAGES/DRESSINGS) IMPLANT
ELECT REM PT RETURN 9FT ADLT (ELECTROSURGICAL) ×4
ELECTRODE REM PT RTRN 9FT ADLT (ELECTROSURGICAL) ×2 IMPLANT
GAUZE SPONGE 4X4 12PLY STRL (GAUZE/BANDAGES/DRESSINGS) IMPLANT
GAUZE SPONGE 4X4 16PLY XRAY LF (GAUZE/BANDAGES/DRESSINGS) ×1 IMPLANT
GLOVE BIO SURGEON STRL SZ8.5 (GLOVE) ×1 IMPLANT
GLOVE BIOGEL PI IND STRL 6.5 (GLOVE) IMPLANT
GLOVE BIOGEL PI IND STRL 7.5 (GLOVE) ×1 IMPLANT
GLOVE BIOGEL PI INDICATOR 6.5 (GLOVE) ×3
GLOVE BIOGEL PI INDICATOR 7.5 (GLOVE) ×1
GLOVE ECLIPSE 8.0 STRL XLNG CF (GLOVE) ×2 IMPLANT
GOWN STRL REUS W/ TWL LRG LVL3 (GOWN DISPOSABLE) IMPLANT
GOWN STRL REUS W/ TWL XL LVL3 (GOWN DISPOSABLE) ×1 IMPLANT
GOWN STRL REUS W/TWL LRG LVL3 (GOWN DISPOSABLE)
GOWN STRL REUS W/TWL XL LVL3 (GOWN DISPOSABLE) ×2
KIT ROOM TURNOVER OR (KITS) ×2 IMPLANT
NDL PERC 18GX7CM (NEEDLE) IMPLANT
NEEDLE PERC 18GX7CM (NEEDLE) ×2 IMPLANT
PAD ARMBOARD 7.5X6 YLW CONV (MISCELLANEOUS) ×4 IMPLANT
PAD ELECT DEFIB RADIOL ZOLL (MISCELLANEOUS) ×2 IMPLANT
REMINGTON MEDICAL 12FT. DISPOSABLE EXTENSION CABLE WITH SAFE CONNECT ALLIGATOR CLIP ×1 IMPLANT
SHEATH EVOLUTION SHORTE RL 11F (SHEATH) ×1 IMPLANT
SHEATH PINNACLE 6F 10CM (SHEATH) ×1 IMPLANT
SHEATH STEADY STABILIZING 11F (SHEATH) ×1 IMPLANT
SNARE NEEDLE EYE 13X54 WRKSTAT (CATHETERS) ×1 IMPLANT
SPONGE GAUZE 4X4 12PLY STER LF (GAUZE/BANDAGES/DRESSINGS) ×3 IMPLANT
STYLET LIBERATOR LOCKING (MISCELLANEOUS) ×2 IMPLANT
SUT PROLENE 2 0 SH DA (SUTURE) ×1 IMPLANT
SUT SILK  1 MH (SUTURE) ×1
SUT SILK 0 (SUTURE) ×2
SUT SILK 0 MO-6 18XCR BRD 8 (SUTURE) IMPLANT
SUT SILK 1 MH (SUTURE) IMPLANT
TAPE CLOTH SURG 4X10 WHT LF (GAUZE/BANDAGES/DRESSINGS) ×3 IMPLANT
TOWEL OR 17X24 6PK STRL BLUE (TOWEL DISPOSABLE) ×4 IMPLANT
TOWEL OR 17X26 10 PK STRL BLUE (TOWEL DISPOSABLE) ×4 IMPLANT
TRAY FOLEY IC TEMP SENS 14FR (CATHETERS) ×3 IMPLANT
TUBE CONNECTING 12X1/4 (SUCTIONS) ×1 IMPLANT
WIRE EXCHANGE J-TIP (WIRE) ×1 IMPLANT
YANKAUER SUCT BULB TIP NO VENT (SUCTIONS) ×1 IMPLANT

## 2013-10-27 NOTE — Progress Notes (Signed)
Pt HR 50 bp 146/50, paged Dr. Eliseo Squires does she want to hold this dose Coreg 6.25 and Imdur 30 due to hr of 50

## 2013-10-27 NOTE — OR Nursing (Signed)
Dr. Cristopher Peru removed Pacemaker device Victory XL DR [id number] (# [id number] pr 9.7). Also removed A Lead 1642T/52 SN: [id number]. Removed V lead 1336T/58 SN: [id number] but 2cm of tip still remains in right ventricle.

## 2013-10-27 NOTE — Clinical Documentation Improvement (Signed)
  Anemia- suspected related to bleed with IV yesterday- give PRBCs today      Thank Sheral Flow RN [place] [phone] HIM department

## 2013-10-27 NOTE — Anesthesia Postprocedure Evaluation (Signed)
Anesthesia Post Note  Patient: Colin [name]  Procedure(s) Performed: Procedure(s) (LRB): PACEMAKER LEAD REMOVAL (Right)  Anesthesia type: general  Patient location: PACU  Post pain: Pain level controlled  Post assessment: Patient's Cardiovascular Status Stable  Last Vitals:  Filed Vitals:   10/27/13 1257  BP: 146/50  Pulse: 50  Temp: 36.5 C  Resp: 18    Post vital signs: Reviewed and stable  Level of consciousness: sedated  Complications: No apparent anesthesia complications

## 2013-10-27 NOTE — Progress Notes (Signed)
Paged Dr. Lovena Le [phone] for pts son in room would like to speak with him.

## 2013-10-27 NOTE — Interval H&P Note (Signed)
History and Physical Interval Note: Since my consult note on 10/23/13, there has been no change in the history, physical exam, assessment and plan.  10/27/2013 7:10 AM  Colin [name]  has presented today for surgery, with the diagnosis of infected pacemaker lead  The various methods of treatment have been discussed with the patient and family. After consideration of risks, benefits and other options for treatment, the patient has consented to  Procedure(s) with comments: PACEMAKER LEAD REMOVAL (N/A) - Colin [name] backing up as a surgical intervention .  The patient's history has been reviewed, patient examined, no change in status, stable for surgery.  I have reviewed the patient's chart and labs.  Questions were answered to the patient's satisfaction.     Mikle Bosworth.D.

## 2013-10-27 NOTE — Progress Notes (Signed)
PT Cancellation Note  Patient Details Name: Colin [name] MRN: [id number] DOB: 01/24/1928   Cancelled Treatment:    Reason Eval/Treat Not Completed: Patient at procedure or test/unavailable  Pt to OR this am.  Will f/u another time.     Dolora Ridgely, Thornton Papas 10/27/2013, 6:51 AM

## 2013-10-27 NOTE — Progress Notes (Signed)
Paged Dr. Eliseo Squires again does she want to hold Coreg 6.25 or the Imdur 30 mg po this evenings dose.

## 2013-10-27 NOTE — Progress Notes (Signed)
PROGRESS NOTE  Colin [name] [id number] DOB: [birth year] DOA: 10/19/2013 PCP: Horatio Pel, MD   Assessment/Plan:  Enterococcus Bacteremia/Pacemaker lead endocarditis:  TEE:  4 x 25 mm vegetation attached to the right atrial portion of the RV pacer lead. Seen by EP yesterday with plan for lead extraction on Monday 2/2 BC growing enterococcus sp.   - vanc/ceftriaxone  -Repeat BC NGTD  -ID consult- needs 6 weeks of IV abx: weeks of ceftriaxone 2 grams once a day and ampicillin every 6 hours through Nov 15th -s/p pace maker removal - will need central line as patient not able to have PICC per nephrology as he may need dialysis    CORONARY ARTERY DISEASE   Cardiac pacemaker in situ   Chronic systolic CHF (congestive heart failure): compensated   Peripheral vascular disease, unspecified  acute renal failue with CKD (chronic kidney disease), stage III. BUN down. Creat down. Saline lock   Falls at home, multiple.- SNF?   Type 2 diabetes, uncontrolled, with renal manifestation: adjust insulin   Weakness generalized protien calorie malnutrition troponins trending down and no CP. Likely from renal failure.  Acute blood loss Anemia- suspected related to bleed with IV yesterday- give PRBCs today  Code Status:  full Family Communication:  Son at bedside Disposition Plan:  SNF?  Consultants:  EP  cards  Procedures:        HPI/Subjective: Just back from having pacemaker removed  Objective: Filed Vitals:   10/27/13 1052  BP: 136/40  Pulse: 61  Temp:   Resp: 17    Intake/Output Summary (Last 24 hours) at 10/27/13 1107 Last data filed at 10/27/13 1030  Gross per 24 hour  Intake   1670 ml  Output   2275 ml  Net   -605 ml   Filed Weights   10/24/13 2035 10/25/13 2129 10/26/13 2039  Weight: 75.1 kg (165 lb 9.1 oz) 71.85 kg (158 lb 6.4 oz) 71.85 kg (158 lb 6.4 oz)    Exam:   General:  Frail, elderly. comfortable  Cardiovascular: RRR without  MGR  Respiratory: CTA without WRR  Abdomen: s, nt, nd  Ext: no CCE  Basic Metabolic Panel:  Recent Labs Lab 10/21/13 0554 10/23/13 0745 10/26/13 0505 10/27/13 0310 10/27/13 1018  NA 139 138 135* 138 138  K 5.2 4.4 4.4 4.1 4.3  CL 109 108 103 105  --   CO2 20 21 23 23   --   GLUCOSE 108* 90 88 122* 115*  BUN 49* 31* 32* 31*  --   CREATININE 1.80* 1.70* 2.06* 2.04*  --   CALCIUM 8.7 8.9 8.5 8.4  --    Liver Function Tests: No results found for this basename: AST, ALT, ALKPHOS, BILITOT, PROT, ALBUMIN,  in the last 168 hours No results found for this basename: LIPASE, AMYLASE,  in the last 168 hours No results found for this basename: AMMONIA,  in the last 168 hours CBC:  Recent Labs Lab 10/21/13 0554 10/23/13 0745 10/26/13 0505 10/27/13 0310 10/27/13 1018  WBC 9.0 5.9 5.7 4.9  --   NEUTROABS 6.3  --  3.4  --   --   HGB 9.0* 8.2* 6.8* 7.2* 7.1*  HCT 28.3* 26.2* 21.9* 22.5* 21.0*  MCV 76.3* 79.2 80.2 77.3*  --   PLT 218 243 251 257  --    Cardiac Enzymes: No results found for this basename: CKTOTAL, CKMB, CKMBINDEX, TROPONINI,  in the last 168 hours BNP (last 3 results)  Recent Labs  10/19/13 1200  PROBNP 2214.0*   CBG:  Recent Labs Lab 10/26/13 0814 10/26/13 1150 10/26/13 1656 10/26/13 2043 10/27/13 1054  GLUCAP 73 115* 167* 185* 109*    Recent Results (from the past 240 hour(s))  CULTURE, BLOOD (ROUTINE X 2)     Status: None   Collection Time    10/19/13  6:52 AM      Result Value Ref Range Status   Specimen Description BLOOD RIGHT ARM   Final   Special Requests BOTTLES DRAWN AEROBIC AND ANAEROBIC 5CC EA   Final   Culture  Setup Time     Final   Value: 10/19/2013 13:29     Performed at [facility]   Culture     Final   Value: ENTEROCOCCUS SPECIES     Note: SUSCEPTIBILITIES PERFORMED ON PREVIOUS CULTURE WITHIN THE LAST 5 DAYS.     Note: Gram Stain Report Called to,Read Back By and Verified With: JAY NARAMDAS @ [id number] ON [id number] BY [facility]      Performed at [facility]   Report Status 10/23/2013 FINAL   Final  CULTURE, BLOOD (ROUTINE X 2)     Status: None   Collection Time    10/19/13  6:58 AM      Result Value Ref Range Status   Specimen Description BLOOD LEFT ARM   Final   Special Requests BOTTLES DRAWN AEROBIC ONLY 2CC   Final   Culture  Setup Time     Final   Value: 10/19/2013 13:29     Performed at [facility]   Culture     Final   Value: ENTEROCOCCUS SPECIES     Note: Gram Stain Report Called to,Read Back By and Verified With: KAMI MOORE ON 10/20/2013 AT 10:25P BY WILEJ     Performed at [facility]   Report Status 10/23/2013 FINAL   Final   Organism ID, Bacteria ENTEROCOCCUS SPECIES   Final  URINE CULTURE     Status: None   Collection Time    10/19/13  9:12 AM      Result Value Ref Range Status   Specimen Description URINE, RANDOM   Final   Special Requests NONE   Final   Culture  Setup Time     Final   Value: 10/19/2013 17:01     Performed at [facility]     Final   Value: >=100,000 COLONIES/ML     Performed at [facility]   Culture     Final   Value: Multiple bacterial morphotypes present, none predominant. Suggest appropriate recollection if clinically indicated.     Performed at [facility]   Report Status 10/20/2013 FINAL   Final  CULTURE, BLOOD (ROUTINE X 2)     Status: None   Collection Time    10/23/13  7:40 AM      Result Value Ref Range Status   Specimen Description BLOOD LEFT HAND   Final   Special Requests BOTTLES DRAWN AEROBIC ONLY 3CC   Final   Culture  Setup Time     Final   Value: 10/23/2013 12:54     Performed at [facility]   Culture     Final   Value:        BLOOD CULTURE RECEIVED NO GROWTH TO DATE CULTURE WILL BE HELD FOR 5 DAYS BEFORE ISSUING A FINAL NEGATIVE REPORT     Performed at [facility]  Report Status PENDING   Incomplete  CULTURE, BLOOD (ROUTINE X 2)     Status: None   Collection Time     10/23/13  7:45 AM      Result Value Ref Range Status   Specimen Description BLOOD LEFT HAND   Final   Special Requests BOTTLES DRAWN AEROBIC ONLY 4CC   Final   Culture  Setup Time     Final   Value: 10/23/2013 12:54     Performed at [facility]   Culture     Final   Value:        BLOOD CULTURE RECEIVED NO GROWTH TO DATE CULTURE WILL BE HELD FOR 5 DAYS BEFORE ISSUING A FINAL NEGATIVE REPORT     Performed at [facility]   Report Status PENDING   Incomplete  SURGICAL PCR SCREEN     Status: None   Collection Time    10/27/13  3:30 AM      Result Value Ref Range Status   MRSA, PCR NEGATIVE  NEGATIVE Final   Staphylococcus aureus NEGATIVE  NEGATIVE Final   Comment:            The Xpert SA Assay (FDA     approved for NASAL specimens     in patients over 69 years of age),     is one component of     a comprehensive surveillance     program.  Test performance has     been validated by [employer] for patients greater     than or equal to 37 year old.     It is not intended     to diagnose infection nor to     guide or monitor treatment.     Studies: No results found. Echo Left ventricle: The cavity size was normal. Wall thickness was increased in a pattern of mild LVH. Systolic function was moderately to severely reduced. The estimated ejection fraction was in the range of 30% to 35%. Diffuse hypokinesis. Doppler parameters are consistent with abnormal left ventricular relaxation (grade 1 diastolic dysfunction). - Ventricular septum: Septal motion showed abnormal function and dyssynergy. - Aortic valve: Trileaflet; moderately calcified leaflets. There was trivial regurgitation. - Mitral valve: There was trivial regurgitation. - Left atrium: The atrium was moderately dilated. - Right ventricle: Pacer wire or catheter noted in right ventricle. Systolic function was reduced. - Right atrium: The atrium was mildly dilated. Pacer wire or catheter noted in  right atrium. Central venous pressure (est): 15 mm Hg. - Atrial septum: No defect or patent foramen ovale was identified. - Tricuspid valve: Mildly thickened leaflets. There was mild regurgitation. - Pulmonary arteries: PA peak pressure: 39 mm Hg (S). - Pericardium, extracardiac: There was no pericardial effusion.  Impressions:  - Mild LVH with LVEF approximately 30-35%, septal dyssynergy, grade 1 diastolic dysfunction. Sclerotic aortic valve with trivial aortic regurgitation. Device wire noted in right heart. Reduced RV contraction. Thickened tricuspid valve with mild tricuspid regurgitation and PASP 39 mmHg. No obvious vegetations noted - shadowing in region of tricuspid valve likely secondary to device wire. If high suspicion of endocarditis, TEE could be considered.   Scheduled Meds: . [MAR HOLD] sodium chloride   Intravenous Once  . sodium chloride   Intravenous Once  . [MAR HOLD] ampicillin (OMNIPEN) IV  2 g Intravenous 4 times per day  . [facility] HOLD] aspirin EC  81 mg Oral Daily  . [facility] HOLD] carvedilol  6.25 mg  Oral BID WC  . [MAR HOLD] cefTRIAXone (ROCEPHIN)  IV  2 g Intravenous Q12H  . [facility] HOLD] docusate sodium  100 mg Oral BID  . [facility] HOLD] ezetimibe  10 mg Oral Daily  . [facility] HOLD] furosemide  40 mg Oral Daily  . gentamicin irrigation  80 mg Irrigation On Call  . [facility] HOLD] hydrALAZINE  10 mg Oral 3 times per day  . [MAR HOLD] insulin aspart  0-15 Units Subcutaneous TID WC  . [MAR HOLD] insulin aspart  0-5 Units Subcutaneous QHS  . [MAR HOLD] insulin glargine  15 Units Subcutaneous QHS  . [MAR HOLD] isosorbide mononitrate  30 mg Oral Daily  . [MAR HOLD] linagliptin  5 mg Oral Daily  . [MAR HOLD] pantoprazole  40 mg Oral Daily  . [MAR HOLD] sodium chloride  3 mL Intravenous Q12H  . [facility] HOLD] vitamin B-12  1,000 mcg Oral Daily   Continuous Infusions: . sodium chloride 20 mL/hr (10/27/13 0234)  . sodium chloride 50 mL/hr (10/27/13 0546)    Time spent: 25  minutes  Eulogio Bear  [facility] Pager [phone]. If 7PM-7AM, please contact night-coverage at [email or web address], password [facility] 10/27/2013, 11:07 AM  LOS: 8 days

## 2013-10-27 NOTE — Anesthesia Preprocedure Evaluation (Addendum)
Anesthesia Evaluation  Patient identified by MRN, date of birth, ID band Patient awake    Reviewed: Allergy & Precautions, H&P , NPO status , Patient's Chart, lab work & pertinent test results  Airway Mallampati: I TM Distance: >3 FB Neck ROM: Full    Dental   Pulmonary former smoker,          Cardiovascular hypertension, Pt. on medications + CAD, + Past MI and + CABG + pacemaker  Endocarditis on pacemaker lead   Neuro/Psych    GI/Hepatic   Endo/Other  diabetes, Poorly Controlled, Type 2, Insulin Dependent, Oral Hypoglycemic Agents  Renal/GU CRFRenal disease     Musculoskeletal   Abdominal   Peds  Hematology   Anesthesia Other Findings   Reproductive/Obstetrics                          Anesthesia Physical Anesthesia Plan  ASA: III  Anesthesia Plan: General   Post-op Pain Management:    Induction: Intravenous  Airway Management Planned: Oral ETT  Additional Equipment: Arterial line  Intra-op Plan:   Post-operative Plan: Extubation in OR  Informed Consent: I have reviewed the patients History and Physical, chart, labs and discussed the procedure including the risks, benefits and alternatives for the proposed anesthesia with the patient or authorized representative who has indicated his/her understanding and acceptance.     Plan Discussed with: CRNA and Surgeon  Anesthesia Plan Comments:         Anesthesia Quick Evaluation

## 2013-10-27 NOTE — Progress Notes (Signed)
[facility] for Infectious Disease  Date of Admission:  10/19/2013  Antibiotics: Ampicillin and ceftriaxone  Subjective: No complaints, s/p explant  Objective: Temp:  [98 F (36.7 C)-98.8 F (37.1 C)] 98 F (36.7 C) (10/05 1050) Pulse Rate:  [50-70] 50 (10/05 1130) Resp:  [12-25] 12 (10/05 1130) BP: (120-139)/(40-61) 122/42 mmHg (10/05 1130) SpO2:  [94 %-99 %] 97 % (10/05 1130) Arterial Line BP: (144-150)/(33-39) 144/33 mmHg (10/05 1100) Weight:  [158 lb 6.4 oz (71.85 kg)] 158 lb 6.4 oz (71.85 kg) (10/04 2039)  General: awake, in bed, nad, groggy Skin: no rashes Lungs: CTA B Cor: RRR Abdomen: soft, nt   Lab Results Lab Results  Component Value Date   WBC 4.9 10/27/2013   HGB 7.1* 10/27/2013   HCT 21.0* 10/27/2013   MCV 77.3* 10/27/2013   PLT 257 10/27/2013    Lab Results  Component Value Date   CREATININE 2.04* 10/27/2013   BUN 31* 10/27/2013   NA 138 10/27/2013   K 4.3 10/27/2013   CL 105 10/27/2013   CO2 23 10/27/2013    Lab Results  Component Value Date   ALT 14 10/20/2013   AST 20 10/20/2013   ALKPHOS 65 10/20/2013   BILITOT 0.3 10/20/2013      Microbiology: Recent Results (from the past 240 hour(s))  CULTURE, BLOOD (ROUTINE X 2)     Status: None   Collection Time    10/19/13  6:52 AM      Result Value Ref Range Status   Specimen Description BLOOD RIGHT ARM   Final   Special Requests BOTTLES DRAWN AEROBIC AND ANAEROBIC 5CC EA   Final   Culture  Setup Time     Final   Value: 10/19/2013 13:29     Performed at [facility]   Culture     Final   Value: ENTEROCOCCUS SPECIES     Note: SUSCEPTIBILITIES PERFORMED ON PREVIOUS CULTURE WITHIN THE LAST 5 DAYS.     Note: Gram Stain Report Called to,Read Back By and Verified With: JAY NARAMDAS @ [id number] ON [id number] BY [facility]     Performed at [facility]   Report Status 10/23/2013 FINAL   Final  CULTURE, BLOOD (ROUTINE X 2)     Status: None   Collection Time    10/19/13  6:58 AM      Result Value Ref  Range Status   Specimen Description BLOOD LEFT ARM   Final   Special Requests BOTTLES DRAWN AEROBIC ONLY 2CC   Final   Culture  Setup Time     Final   Value: 10/19/2013 13:29     Performed at [facility]   Culture     Final   Value: ENTEROCOCCUS SPECIES     Note: Gram Stain Report Called to,Read Back By and Verified With: KAMI MOORE ON 10/20/2013 AT 10:25P BY WILEJ     Performed at [facility]   Report Status 10/23/2013 FINAL   Final   Organism ID, Bacteria ENTEROCOCCUS SPECIES   Final  URINE CULTURE     Status: None   Collection Time    10/19/13  9:12 AM      Result Value Ref Range Status   Specimen Description URINE, RANDOM   Final   Special Requests NONE   Final   Culture  Setup Time     Final   Value: 10/19/2013 17:01     Performed at [place]  Final   Value: >=100,000 COLONIES/ML     Performed at [facility]   Culture     Final   Value: Multiple bacterial morphotypes present, none predominant. Suggest appropriate recollection if clinically indicated.     Performed at [facility]   Report Status 10/20/2013 FINAL   Final  CULTURE, BLOOD (ROUTINE X 2)     Status: None   Collection Time    10/23/13  7:40 AM      Result Value Ref Range Status   Specimen Description BLOOD LEFT HAND   Final   Special Requests BOTTLES DRAWN AEROBIC ONLY 3CC   Final   Culture  Setup Time     Final   Value: 10/23/2013 12:54     Performed at [facility]   Culture     Final   Value:        BLOOD CULTURE RECEIVED NO GROWTH TO DATE CULTURE WILL BE HELD FOR 5 DAYS BEFORE ISSUING A FINAL NEGATIVE REPORT     Performed at [facility]   Report Status PENDING   Incomplete  CULTURE, BLOOD (ROUTINE X 2)     Status: None   Collection Time    10/23/13  7:45 AM      Result Value Ref Range Status   Specimen Description BLOOD LEFT HAND   Final   Special Requests BOTTLES DRAWN AEROBIC ONLY 4CC   Final   Culture  Setup Time      Final   Value: 10/23/2013 12:54     Performed at [facility]   Culture     Final   Value:        BLOOD CULTURE RECEIVED NO GROWTH TO DATE CULTURE WILL BE HELD FOR 5 DAYS BEFORE ISSUING A FINAL NEGATIVE REPORT     Performed at [facility]   Report Status PENDING   Incomplete  SURGICAL PCR SCREEN     Status: None   Collection Time    10/27/13  3:30 AM      Result Value Ref Range Status   MRSA, PCR NEGATIVE  NEGATIVE Final   Staphylococcus aureus NEGATIVE  NEGATIVE Final   Comment:            The Xpert SA Assay (FDA     approved for NASAL specimens     in patients over 14 years of age),     is one component of     a comprehensive surveillance     program.  Test performance has     been validated by [employer] for patients greater     than or equal to 84 year old.     It is not intended     to diagnose infection nor to     guide or monitor treatment.    Studies/Results: No results found.  Assessment/Plan: 1) pacemaker infection with Enterococcal bacteremia - on dual beta lactam therapy.  Extraction done this am.  Repeat blood cultures ngtd.   -will need 6 weeks of ceftriaxone 2 grams once a day and ampicillin every 6 hours through Nov 15th.    Scharlene Gloss, [facility] Infectious [facility] [place] [email or web address] [phone] pager   [phone] cell 10/27/2013, 12:38 PM

## 2013-10-27 NOTE — CV Procedure (Signed)
Electrophysiology Procedure Note  Procedure: extraction of a dual-chamber pacemaker lead system and pacemaker generator  Preoperative diagnosis: Pacemaker lead endocarditis   Postoperative diagnosis: Same as preoperative diagnosis  Description of the procedure: After informed consent was obtained, the patient was taken to the operating room in a fasting state. After the usual preparation and draping and application of general anesthesia, 30 cc of lidocaine was infiltrated into the right infraclavicular region. A 5 cm incision was carried out. Electrocautery was utilized to dissect down to the pacemaker pocket. Electrocautery was utilized to free up the pacemaker generator and leads. The generator was removed with gentle traction. Electrocautery was utilized to dissect down to the suture sleeves. They were removed with gentle traction. At this point the liberator locking stylette Lacinda Axon) were utilized. The stylette could be advanced all the way to the distal portion of the atrial lead, but only halfway down the ventricular lead. An 38 French Cook RL Shorty dissection sheath was advanced over the atrial lead, and with engagement of the tissue, the atrial lead was removed in total. Attention was then turned to the ventricular lead. The 11 French Cook sheath was advanced into the subclavian vein down to the superior vena cava. The [place] long sheath (RL) was then exchanged for the short Cook sheath. Utilizing pressure, counter pressure, traction, and countertraction, the long Cook sheath was advanced all the way down to the right ventricle. The binding sites were more severe than usual. At this point, the inner insulation of the lead broke. The lead was then retracted and cut. Pressure was held. There is approximately 15-20 cm of the lead still in the body. The right femoral vein was punctured. Attempts to advance the 58 French sheath into the vein were unsuccessful because of dense fibrous scar tissue.  The left femoral vein was punctured, and the [place] extraction sheath was advanced under fluoroscopic guidance with some difficulty to the junction of the inferior vena cava and right atrium. At this point the curved inner needles eye snair was advanced into the right atrium. The right ventricular lead was successfully snared. The lead was withdrawn into the sheath. Traction on both the inner and outer sheath was then delivered to the remaining portion of the ventricular lead. The tip of the right ventricular lead was firmly bound to the right ventricle. After gentle pressure was applied, the tip of the lead broke with approximately 2 cm remaining in the right ventricle. At this point it was deemed most appropriate to terminate the procedure. Transesophageal echo will be subsequently recommended to see how much if any residual insulation or free-floating lead is present. Under x-ray, there appeared to be none. The pocket was then closed after being vigorously irrigated with antibiotic irrigation with multiple 2-0 Prolene suture. Hemostasis was obtained and the patient was returned to the recovery area in satisfactory condition.  Complications: There were no immediate procedure complications  Results: Successful pacemaker system extraction of all but approximately 2 cm of ventricular lead which was firmly embedded in the right ventricular septum.  Cristopher Peru, M.D.

## 2013-10-27 NOTE — H&P (View-Only) (Signed)
ELECTROPHYSIOLOGY CONSULT NOTE    Patient ID: Colin [name] MRN: [id number], DOB/AGE: [birth year] 78 y.o.  Admit date: 10/19/2013 Date of Consult: 10-23-13  Primary Physician: Horatio Pel, MD Primary Cardiologist: Nahser  Reason for Consultation: bacteremia and indwelling pacemaker  HPI:  Colin [name] is a 80 y.o. male with a past medical history significant for diabetes, CAD/CABG (04), sinus node dysfunction status post pacemaker 2007, chronic renal insufficiency,  PVD (R gangrenous toe removed, Fem-Pop April 2015), CHF (EF 30%).  He was admitted on 9-27 with fever and falls at home. He had fever for ~ 2 weeks PTA. Blood cultures have grown enterococcus.  TEE today demonstrated vegetation on RV lead (4X28mm).  EP has been asked to evaluate for treatment options.  He had a short stay at SNF post fem-pop and toe amputations and has been living at home with his wife since.  She reports that he has had several falls and on the day of admission she found him on the floor and confused.  He is not very active at home.  He denies chest pain, shortness of breath, nausea, vomiting, diarrhea.  ROS is otherwise negative.    TEE 10-23-13 demonstrated EF 30-35%, no RWMA, large vegetation on RV lead in region of tricuspid valve.   Past Medical History  Diagnosis Date  . Coronary artery disease     Status post CABG in 2004  . Peripheral vascular disease     Status post right femoropopliteal bypass  . Dyslipidemia   . Diabetes mellitus with peripheral artery disease   . Hypertension   . Chronic renal insufficiency     creatinine around 2.4  . CHF (congestive heart failure) 2007    ef 38%  . History of CVA (cerebrovascular accident)   . Cardiomyopathy   . COPD (chronic obstructive pulmonary disease)   . History of anemia   . Myocardial infarction   . Shortness of breath   . Pacemaker   . Pneumonia   . Anginal pain   . Dementia   . GERD (gastroesophageal reflux disease)   .  Arthritis      Surgical History:  Past Surgical History  Procedure Laterality Date  . Insert / replace / remove pacemaker  05/17/2005    [employer] dual chamber ddd mode   . Coronary artery bypass graft  4 of 2004    4 vessel bypass  . Femoral bypass  july 2006    right with right great toe amputation  . Cardiac catheterization  04/29/2002    Dr. Glade Lloyd  . Esophagogastroduodenoscopy  07/21/2011    Procedure: ESOPHAGOGASTRODUODENOSCOPY (EGD);  Surgeon: Beryle Beams, MD;  Location: [facility];  Service: Endoscopy;  Laterality: N/A;  . Coronary angioplasty    . Femoral-popliteal bypass graft Left 05/01/2013    Procedure: LEFT FEMORAL TO ABOVE KNEE POPLITEAL ARTERY BYPASS GRAFT WITH INTRAOPERATIVE ARTEROGRAM;  Surgeon: Mal Misty, MD;  Location: [facility];  Service: Vascular;  Laterality: Left;  . Amputation Left 05/05/2013    Procedure: AMPUTATION RAY- FIRST AND SECOND-LEFT FOOT;  Surgeon: Newt Minion, MD;  Location: [facility];  Service: Orthopedics;  Laterality: Left;     Prescriptions prior to admission  Medication Sig Dispense Refill  . aspirin EC 81 MG tablet Take 81 mg by mouth daily.      . B Complex-C (B-COMPLEX WITH VITAMIN C) tablet Take 1 tablet by mouth daily.      . carvedilol (COREG) 6.25 MG tablet Take  6.25 mg by mouth 2 (two) times daily with a meal.      . ezetimibe (ZETIA) 10 MG tablet Take 10 mg by mouth daily.      . furosemide (LASIX) 40 MG tablet Take 40 mg by mouth daily.      . insulin glargine (LANTUS) 100 UNIT/ML injection Inject 20 Units into the skin at bedtime.      . isosorbide mononitrate (IMDUR) 30 MG 24 hr tablet Take 30 mg by mouth daily.      Marland Kitchen linagliptin (TRADJENTA) 5 MG TABS tablet Take 5 mg by mouth daily.      . Omega-3 Fatty Acids (FISH OIL) 1000 MG CAPS Take 1,000 mg by mouth daily.       Marland Kitchen omeprazole (PRILOSEC) 40 MG capsule Take 40 mg by mouth daily.      . vitamin B-12 (CYANOCOBALAMIN) 1000 MCG tablet Take 1,000 mcg by mouth daily.      .  [DISCONTINUED] carvedilol (COREG) 6.25 MG tablet TAKE 1 TABLET BY MOUTH TWICE A DAY WITH A MEAL  60 tablet  0  . glucose 4 GM chewable tablet Chew 4 tablets (16 g total) by mouth as needed for low blood sugar.  50 tablet  12    Inpatient Medications:  . aspirin EC  81 mg Oral Daily  . carvedilol  6.25 mg Oral BID WC  . cefTRIAXone (ROCEPHIN)  IV  1 g Intravenous Q24H  . docusate sodium  100 mg Oral BID  . enoxaparin (LOVENOX) injection  30 mg Subcutaneous Q24H  . ezetimibe  10 mg Oral Daily  . insulin aspart  0-15 Units Subcutaneous TID WC  . insulin aspart  0-5 Units Subcutaneous QHS  . insulin glargine  25 Units Subcutaneous QHS  . isosorbide mononitrate  30 mg Oral Daily  . linagliptin  5 mg Oral Daily  . pantoprazole  40 mg Oral Daily  . sodium chloride  3 mL Intravenous Q12H  . vancomycin  1,000 mg Intravenous Q24H  . vitamin B-12  1,000 mcg Oral Daily    Allergies:  Allergies  Allergen Reactions  . Lisinopril Other (See Comments)    REACTION:  Elevated potassium,renal insuf    History   Social History  . Marital Status: Married    Spouse Name: N/A    Number of Children: N/A  . Years of Education: N/A   Occupational History  . Not on file.   Social History Main Topics  . Smoking status: Former Smoker -- 1.00 packs/day for 50 years    Types: Cigarettes    Quit date: 06/09/2002  . Smokeless tobacco: Never Used  . Alcohol Use: No  . Drug Use: No  . Sexual Activity: Not Currently   Other Topics Concern  . Not on file   Social History Narrative   Lives in a house.  Lives with his wive.  Drives.  Uses a cane for ambulation.       Family History  Problem Relation Age of Onset  . Hyperlipidemia Mother   . Heart disease Father   . Diabetes    . Diabetes Sister   . Hypertension Sister   . Hyperlipidemia Sister     BP 167/54  Pulse 68  Temp(Src) 98.5 F (36.9 C) (Oral)  Resp 16  Ht 5\' 9"  (1.753 m)  Wt 151 lb 7.3 oz (68.7 kg)  BMI 22.36 kg/m2  SpO2  99%  Physical Exam: elderly chronically ill appearingNAD HEENT: Unremarkable,[place], AT Neck:  6 JVD, no thyromegally Back:  No CVA tenderness Lungs:  Clear with no wheezes, rales, or rhonchi HEART:  Regular rate rhythm, no murmurs, no rubs, no clicks Abd:  soft, positive bowel sounds, no organomegally, no rebound, no guarding Ext:  2 plus pulses, no edema, no cyanosis, no clubbing Skin:  No rashes no nodules Neuro:  CN II through XII intact, motor grossly intact    Labs:   Lab Results  Component Value Date   WBC 5.9 10/23/2013   HGB 8.2* 10/23/2013   HCT 26.2* 10/23/2013   MCV 79.2 10/23/2013   PLT 243 10/23/2013    Recent Labs Lab 10/20/13 0532  10/23/13 0745  NA 135*  < > 138  K 4.9  < > 4.4  CL 104  < > 108  CO2 21  < > 21  BUN 68*  < > 31*  CREATININE 2.15*  < > 1.70*  CALCIUM 9.1  < > 8.9  PROT 6.9  --   --   BILITOT 0.3  --   --   ALKPHOS 65  --   --   ALT 14  --   --   AST 20  --   --   GLUCOSE 167*  < > 90  < > = values in this interval not displayed.   Radiology/Studies: Dg Chest 2 View 10/19/2013   CLINICAL DATA:  Fever, confusion and fall.  EXAM: CHEST  2 VIEW  COMPARISON:  04/30/2013  FINDINGS: Stable appearance of the right dual lead cardiac pacemaker. Heart and mediastinum are stable. Post CABG changes in the chest. There is mild elevation of the posterior hemidiaphragms bilaterally and this is similar to the prior examination. No evidence for airspace disease or pleural effusions.  IMPRESSION: No acute cardiopulmonary disease.   Electronically Signed   By: Markus Daft M.D.   On: 10/19/2013 07:59   Ct Head Wo Contrast 10/19/2013   CLINICAL DATA:  Fall and complains of posterior neck pain.  EXAM: CT HEAD WITHOUT CONTRAST  CT CERVICAL SPINE WITHOUT CONTRAST  TECHNIQUE: Multidetector CT imaging of the head and cervical spine was performed following the standard protocol without intravenous contrast. Multiplanar CT image reconstructions of the cervical spine were also  generated.  COMPARISON:  Head CT 07/01/2012  FINDINGS: CT HEAD FINDINGS  There is cerebral atrophy which has minimally changed. There is extensive low-density in the periventricular and subcortical white matter suggesting chronic changes. No evidence for acute hemorrhage, mass lesion, midline shift, hydrocephalus or large infarct. Again noted is an mixed sclerotic and lucent process involving the greater wing of the left sphenoid. This appears to obliterate the left sphenoid sinus and this has not significantly changed. Findings suggest a chronic benign process such as Paget's disease.  CT CERVICAL SPINE FINDINGS  Lung apices are clear. Negative for a fracture or dislocation. Multilevel degenerative disease in the cervical spine with disc space narrowing from C3-C7. Normal alignment at the cervical-thoracic junction. Multiple levels of the uncovertebral spurring.  IMPRESSION: No acute intracranial abnormality.  No acute bone abnormality in the cervical spine.  Atrophy and evidence for chronic small vessel ischemic changes.  Multilevel degenerative disease in the cervical spine.   Electronically Signed   By: Markus Daft M.D.   On: 10/19/2013 08:12   Ct Cervical Spine Wo Contrast 10/19/2013   CLINICAL DATA:  Fall and complains of posterior neck pain.  EXAM: CT HEAD WITHOUT CONTRAST  CT CERVICAL SPINE WITHOUT CONTRAST  TECHNIQUE: Multidetector CT  imaging of the head and cervical spine was performed following the standard protocol without intravenous contrast. Multiplanar CT image reconstructions of the cervical spine were also generated.  COMPARISON:  Head CT 07/01/2012  FINDINGS: CT HEAD FINDINGS  There is cerebral atrophy which has minimally changed. There is extensive low-density in the periventricular and subcortical white matter suggesting chronic changes. No evidence for acute hemorrhage, mass lesion, midline shift, hydrocephalus or large infarct. Again noted is an mixed sclerotic and lucent process involving the  greater wing of the left sphenoid. This appears to obliterate the left sphenoid sinus and this has not significantly changed. Findings suggest a chronic benign process such as Paget's disease.  CT CERVICAL SPINE FINDINGS  Lung apices are clear. Negative for a fracture or dislocation. Multilevel degenerative disease in the cervical spine with disc space narrowing from C3-C7. Normal alignment at the cervical-thoracic junction. Multiple levels of the uncovertebral spurring.  IMPRESSION: No acute intracranial abnormality.  No acute bone abnormality in the cervical spine.  Atrophy and evidence for chronic small vessel ischemic changes.  Multilevel degenerative disease in the cervical spine.   Electronically Signed   By: Markus Daft M.D.   On: 10/19/2013 08:12    EKG:nsr  TELEMETRY:NSR with PVC's and PAC's  DEVICE HISTORY: STJ dual chamber pacemaker implanted 2007 for sinus node dysfunction; device interrogation pending this admission; last device interrogation demonstrated underlying rhythm of sinus brady in the 40's  A/P 1. PPM lead endocarditis 2. Enterococcal bacteremia 3. H/o symptomatic sinus node dysfunction, not PPM dependent Rec: The patient has a class 1 indication for PM lead extraction. He is not critically ill and appears to have improved since initiation of anti-biotics. Will plan extraction as our schedule permits.  Mikle Bosworth.D.

## 2013-10-27 NOTE — Clinical Documentation Improvement (Signed)
  Chart Note 10/4 states "Anemia- suspected related to bleed with IV yesterday- give PRBCs today" with H/H drop from 8.2/26,2 to 6.8/21.9. Please clarify "Anemia" to better reflect severity of illness and risk of mortality.  Possible conditions - Acute blood loss anemia - Anemia - Other condition  Thank Sheral Flow RN [facility] [phone] HIM department

## 2013-10-27 NOTE — Transfer of Care (Signed)
Immediate Anesthesia Transfer of Care Note  Patient: Colin [name]  Procedure(s) Performed: Procedure(s) with comments: PACEMAKER LEAD REMOVAL (Right) - Roxy Manns backing up  Patient Location: PACU  Anesthesia Type:General  Level of Consciousness: awake, alert  and patient cooperative  Airway & Oxygen Therapy: Patient Spontanous Breathing and Patient connected to nasal cannula oxygen  Post-op Assessment: Report given to PACU RN, Post -op Vital signs reviewed and stable and Patient moving all extremities  Post vital signs: Reviewed and stable  Complications: No apparent anesthesia complications

## 2013-10-28 ENCOUNTER — Ambulatory Visit (HOSPITAL_COMMUNITY): Admission: RE | Admit: 2013-10-28 | Payer: Medicare Other | Source: Ambulatory Visit | Admitting: Cardiology

## 2013-10-28 ENCOUNTER — Encounter (HOSPITAL_COMMUNITY): Payer: Self-pay | Admitting: *Deleted

## 2013-10-28 ENCOUNTER — Encounter (HOSPITAL_COMMUNITY)
Admission: EM | Disposition: A | Discharge status: Skilled Nursing Facility | Payer: Self-pay | Source: Home / Self Care | Attending: Internal Medicine

## 2013-10-28 ENCOUNTER — Inpatient Hospital Stay (HOSPITAL_COMMUNITY): Payer: Medicare Other

## 2013-10-28 DIAGNOSIS — T827XXD Infection and inflammatory reaction due to other cardiac and vascular devices, implants and grafts, subsequent encounter: Secondary | ICD-10-CM

## 2013-10-28 DIAGNOSIS — I2510 Atherosclerotic heart disease of native coronary artery without angina pectoris: Secondary | ICD-10-CM

## 2013-10-28 DIAGNOSIS — I369 Nonrheumatic tricuspid valve disorder, unspecified: Secondary | ICD-10-CM

## 2013-10-28 HISTORY — PX: TEE WITHOUT CARDIOVERSION: SHX5443

## 2013-10-28 LAB — GLUCOSE, CAPILLARY
GLUCOSE-CAPILLARY: 90 mg/dL (ref 70–99)
Glucose-Capillary: 120 mg/dL — ABNORMAL HIGH (ref 70–99)
Glucose-Capillary: 98 mg/dL (ref 70–99)
Glucose-Capillary: 80 mg/dL (ref 70–99)
Glucose-Capillary: 234 mg/dL — ABNORMAL HIGH (ref 70–99)

## 2013-10-28 LAB — MAGNESIUM: MAGNESIUM: 1.7 mg/dL (ref 1.5–2.5)

## 2013-10-28 SURGERY — ECHOCARDIOGRAM, TRANSESOPHAGEAL
Anesthesia: Moderate Sedation

## 2013-10-28 MED ORDER — FENTANYL CITRATE 0.05 MG/ML IJ SOLN
INTRAMUSCULAR | Status: DC | PRN
  Administered 2013-10-28 (×2): 25 ug via INTRAVENOUS

## 2013-10-28 MED ORDER — BUTAMBEN-TETRACAINE-BENZOCAINE 2-2-14 % EX AERO
INHALATION_SPRAY | CUTANEOUS | Status: DC | PRN
  Administered 2013-10-28: 2 via TOPICAL

## 2013-10-28 MED ORDER — FENTANYL CITRATE 0.05 MG/ML IJ SOLN
INTRAMUSCULAR | Status: AC
  Filled 2013-10-28: qty 2

## 2013-10-28 MED ORDER — MIDAZOLAM HCL 5 MG/ML IJ SOLN
INTRAMUSCULAR | Status: AC
  Filled 2013-10-28: qty 2

## 2013-10-28 MED ORDER — MIDAZOLAM HCL 10 MG/2ML IJ SOLN
INTRAMUSCULAR | Status: DC | PRN
  Administered 2013-10-28 (×2): 2 mg via INTRAVENOUS
  Administered 2013-10-28: 1 mg via INTRAVENOUS

## 2013-10-28 MED ORDER — SODIUM CHLORIDE 0.9 % IV SOLN
INTRAVENOUS | Status: DC
  Administered 2013-10-28: 500 mL via INTRAVENOUS

## 2013-10-28 MED ORDER — MAGNESIUM SULFATE 40 MG/ML IJ SOLN
2.0000 g | Freq: Once | INTRAMUSCULAR | Status: AC
  Administered 2013-10-28: 2 g via INTRAVENOUS
  Filled 2013-10-28: qty 50

## 2013-10-28 NOTE — Progress Notes (Signed)
PROGRESS NOTE  Colin [name] [id number] DOB: [birth year] DOA: 10/19/2013 PCP: Horatio Pel, MD  80 year old male with history of coronary artery disease, peripheral vascular disease, COPD, history of CVA, history of congestive heart failure with EF of 30%, who presents with a fever and falls. Apparently the patient has had intermittent low-grade fever, weakness, poor oral intake, myalgia, headache and neck pain Found to have infection on his pacer wire.  Pacemaker was removed 10/5 but small portion remained.  Patient had TEE again on 10/6.  If retained insulation is present, he will either need lifelong suppressive anti-biotics or be referred for extraction of his RV lead tip   Assessment/Plan:  Enterococcus Bacteremia/Pacemaker lead endocarditis:  TEE:  4 x 25 mm vegetation attached to the right atrial portion of the RV pacer lead. Seen by EP yesterday with plan for lead extraction on Monday 2/2 BC growing enterococcus sp.   - vanc/ceftriaxone  -Repeat BC NGTD  -ID consult- needs 6 weeks of IV abx: weeks of ceftriaxone 2 grams once a day and ampicillin every 6 hours through Nov 15th -s/p pace maker removal - will need central line as patient not able to have PICC per nephrology as he may need dialysis in the furture    CORONARY ARTERY DISEASE   Cardiac pacemaker in situ -removed 10/5 -holding parameters on coreg/imdur- ? If patient needs?    Chronic systolic CHF (congestive heart failure): compensated   Peripheral vascular disease, unspecified  acute renal failue with CKD (chronic kidney disease), stage III. BUN down. Creat down. Saline lock   Falls at home, multiple.- SNF?   Type 2 diabetes, uncontrolled, with renal manifestation: adjust insulin   Weakness generalized protien calorie malnutrition troponins trending down and no CP. Likely from renal failure.  Acute blood loss Anemia- suspected related to bleed with IV s/p PRBCs  Code Status:  full Family  Communication:  Son at bedside 10/5 Disposition Plan:  SNF  Consultants:  EP  cards  Procedures:   TEE  Pacemaker extraction  HPI/Subjective: No complaints, in good spirits  Objective: Filed Vitals:   10/28/13 0558  BP: 144/48  Pulse: 62  Temp: 99.4 F (37.4 C)  Resp: 18    Intake/Output Summary (Last 24 hours) at 10/28/13 1002 Last data filed at 10/28/13 0559  Gross per 24 hour  Intake   1110 ml  Output   1500 ml  Net   -390 ml   Filed Weights   10/25/13 2129 10/26/13 2039 10/27/13 2057  Weight: 71.85 kg (158 lb 6.4 oz) 71.85 kg (158 lb 6.4 oz) 71.85 kg (158 lb 6.4 oz)    Exam:   General:  Frail, elderly. comfortable  Cardiovascular: RRR without MGR  Respiratory: CTA without WRR  Abdomen: s, nt, nd  Ext: no CCE  Basic Metabolic Panel:  Recent Labs Lab 10/23/13 0745 10/26/13 0505 10/27/13 0310 10/27/13 1018 10/27/13 1535  NA 138 135* 138 138  --   K 4.4 4.4 4.1 4.3  --   CL 108 103 105  --   --   CO2 21 23 23   --   --   GLUCOSE 90 88 122* 115*  --   BUN 31* 32* 31*  --   --   CREATININE 1.70* 2.06* 2.04*  --  2.04*  CALCIUM 8.9 8.5 8.4  --   --    Liver Function Tests: No results found for this basename: AST, ALT, ALKPHOS, BILITOT, PROT, ALBUMIN,  in the last 168  hours No results found for this basename: LIPASE, AMYLASE,  in the last 168 hours No results found for this basename: AMMONIA,  in the last 168 hours CBC:  Recent Labs Lab 10/23/13 0745 10/26/13 0505 10/27/13 0310 10/27/13 1018 10/27/13 1535  WBC 5.9 5.7 4.9  --  6.7  NEUTROABS  --  3.4  --   --   --   HGB 8.2* 6.8* 7.2* 7.1* 7.9*  HCT 26.2* 21.9* 22.5* 21.0* 24.9*  MCV 79.2 80.2 77.3*  --  76.9*  PLT 243 251 257  --  256   Cardiac Enzymes: No results found for this basename: CKTOTAL, CKMB, CKMBINDEX, TROPONINI,  in the last 168 hours BNP (last 3 results)  Recent Labs  10/19/13 1200  PROBNP 2214.0*   CBG:  Recent Labs Lab 10/27/13 1054 10/27/13 1251  10/27/13 1629 10/27/13 2101 10/28/13 0752  GLUCAP 109* 120* 136* 149* 98    Recent Results (from the past 240 hour(s))  CULTURE, BLOOD (ROUTINE X 2)     Status: None   Collection Time    10/19/13  6:52 AM      Result Value Ref Range Status   Specimen Description BLOOD RIGHT ARM   Final   Special Requests BOTTLES DRAWN AEROBIC AND ANAEROBIC 5CC EA   Final   Culture  Setup Time     Final   Value: 10/19/2013 13:29     Performed at [facility]   Culture     Final   Value: ENTEROCOCCUS SPECIES     Note: SUSCEPTIBILITIES PERFORMED ON PREVIOUS CULTURE WITHIN THE LAST 5 DAYS.     Note: Gram Stain Report Called to,Read Back By and Verified With: JAY NARAMDAS @ [id number] ON [id number] BY [facility]     Performed at [facility]   Report Status 10/23/2013 FINAL   Final  CULTURE, BLOOD (ROUTINE X 2)     Status: None   Collection Time    10/19/13  6:58 AM      Result Value Ref Range Status   Specimen Description BLOOD LEFT ARM   Final   Special Requests BOTTLES DRAWN AEROBIC ONLY 2CC   Final   Culture  Setup Time     Final   Value: 10/19/2013 13:29     Performed at [facility]   Culture     Final   Value: ENTEROCOCCUS SPECIES     Note: Gram Stain Report Called to,Read Back By and Verified With: KAMI MOORE ON 10/20/2013 AT 10:25P BY WILEJ     Performed at [facility]   Report Status 10/23/2013 FINAL   Final   Organism ID, Bacteria ENTEROCOCCUS SPECIES   Final  URINE CULTURE     Status: None   Collection Time    10/19/13  9:12 AM      Result Value Ref Range Status   Specimen Description URINE, RANDOM   Final   Special Requests NONE   Final   Culture  Setup Time     Final   Value: 10/19/2013 17:01     Performed at [place]     Final   Value: >=100,000 COLONIES/ML     Performed at [facility]   Culture     Final   Value: Multiple bacterial morphotypes present, none predominant. Suggest appropriate recollection if clinically  indicated.     Performed at [facility]   Report Status 10/20/2013 FINAL   Final  CULTURE, BLOOD (ROUTINE X 2)     Status: None   Collection Time    10/23/13  7:40 AM      Result Value Ref Range Status   Specimen Description BLOOD LEFT HAND   Final   Special Requests BOTTLES DRAWN AEROBIC ONLY 3CC   Final   Culture  Setup Time     Final   Value: 10/23/2013 12:54     Performed at [facility]   Culture     Final   Value:        BLOOD CULTURE RECEIVED NO GROWTH TO DATE CULTURE WILL BE HELD FOR 5 DAYS BEFORE ISSUING A FINAL NEGATIVE REPORT     Performed at [facility]   Report Status PENDING   Incomplete  CULTURE, BLOOD (ROUTINE X 2)     Status: None   Collection Time    10/23/13  7:45 AM      Result Value Ref Range Status   Specimen Description BLOOD LEFT HAND   Final   Special Requests BOTTLES DRAWN AEROBIC ONLY 4CC   Final   Culture  Setup Time     Final   Value: 10/23/2013 12:54     Performed at [facility]   Culture     Final   Value:        BLOOD CULTURE RECEIVED NO GROWTH TO DATE CULTURE WILL BE HELD FOR 5 DAYS BEFORE ISSUING A FINAL NEGATIVE REPORT     Performed at [facility]   Report Status PENDING   Incomplete  SURGICAL PCR SCREEN     Status: None   Collection Time    10/27/13  3:30 AM      Result Value Ref Range Status   MRSA, PCR NEGATIVE  NEGATIVE Final   Staphylococcus aureus NEGATIVE  NEGATIVE Final   Comment:            The Xpert SA Assay (FDA     approved for NASAL specimens     in patients over 3 years of age),     is one component of     a comprehensive surveillance     program.  Test performance has     been validated by [employer] for patients greater     than or equal to 79 year old.     It is not intended     to diagnose infection nor to     guide or monitor treatment.     Studies: Dg Chest 2 View  10/28/2013   CLINICAL DATA:  Infection and inflammatory reaction due to cardiac device, implant,  or graft, initial encounter, cough, type 2 diabetes mellitus with peripheral artery disease, hypertension, chronic renal insufficiency, CHF, cardiomyopathy not otherwise specified, COPD, coronary artery disease post MI, former smoker  EXAM: CHEST  2 VIEW  COMPARISON:  10/19/2013  FINDINGS: Enlargement of cardiac silhouette post CABG.  Calcified tortuous aorta.  Interval removal of RIGHT subclavian pacemaker.  Eventrations of the posterior hemidiaphragms bilaterally with bibasilar atelectasis.  Minimal central peribronchial thickening.  Question underlying emphysematous changes.  No acute infiltrate or pneumothorax.  Probable small bibasilar effusions.  Bones unremarkable.  IMPRESSION: Enlargement of cardiac silhouette post CABG and pacemaker removal.  Bibasilar atelectasis and probable small pleural effusions.  Question underlying emphysematous changes.   Electronically Signed   By: Lavonia Dana M.D.   On: 10/28/2013 09:04   Echo Left ventricle: The cavity size was normal. Wall  thickness was increased in a pattern of mild LVH. Systolic function was moderately to severely reduced. The estimated ejection fraction was in the range of 30% to 35%. Diffuse hypokinesis. Doppler parameters are consistent with abnormal left ventricular relaxation (grade 1 diastolic dysfunction). - Ventricular septum: Septal motion showed abnormal function and dyssynergy. - Aortic valve: Trileaflet; moderately calcified leaflets. There was trivial regurgitation. - Mitral valve: There was trivial regurgitation. - Left atrium: The atrium was moderately dilated. - Right ventricle: Pacer wire or catheter noted in right ventricle. Systolic function was reduced. - Right atrium: The atrium was mildly dilated. Pacer wire or catheter noted in right atrium. Central venous pressure (est): 15 mm Hg. - Atrial septum: No defect or patent foramen ovale was identified. - Tricuspid valve: Mildly thickened leaflets. There was  mild regurgitation. - Pulmonary arteries: PA peak pressure: 39 mm Hg (S). - Pericardium, extracardiac: There was no pericardial effusion.  Impressions:  - Mild LVH with LVEF approximately 30-35%, septal dyssynergy, grade 1 diastolic dysfunction. Sclerotic aortic valve with trivial aortic regurgitation. Device wire noted in right heart. Reduced RV contraction. Thickened tricuspid valve with mild tricuspid regurgitation and PASP 39 mmHg. No obvious vegetations noted - shadowing in region of tricuspid valve likely secondary to device wire. If high suspicion of endocarditis, TEE could be considered.   Scheduled Meds: . sodium chloride   Intravenous Once  . ampicillin (OMNIPEN) IV  2 g Intravenous 4 times per day  . aspirin EC  81 mg Oral Daily  . carvedilol  6.25 mg Oral BID WC  . cefTRIAXone (ROCEPHIN)  IV  2 g Intravenous Q24H  . docusate sodium  100 mg Oral BID  . ezetimibe  10 mg Oral Daily  . furosemide  40 mg Oral Daily  . heparin subcutaneous  5,000 Units Subcutaneous 3 times per day  . hydrALAZINE  10 mg Oral 3 times per day  . insulin aspart  0-15 Units Subcutaneous TID WC  . insulin aspart  0-5 Units Subcutaneous QHS  . insulin glargine  15 Units Subcutaneous QHS  . isosorbide mononitrate  30 mg Oral Daily  . linagliptin  5 mg Oral Daily  . pantoprazole  40 mg Oral Daily  . sodium chloride  3 mL Intravenous Q12H  . vitamin B-12  1,000 mcg Oral Daily   Continuous Infusions: . sodium chloride      Time spent: 25 minutes  VANN, JESSICA  [facility] Pager [phone]. If 7PM-7AM, please contact night-coverage at [email or web address], password [email or web address] 10/28/2013, 10:02 AM  LOS: 9 days

## 2013-10-28 NOTE — Progress Notes (Signed)
Pt NPO for TEE @ 1300, pt to receive central line placement at some point.

## 2013-10-28 NOTE — Interval H&P Note (Signed)
History and Physical Interval Note:  10/28/2013 8:23 AM  Colin [name]  has presented today for surgery, with the diagnosis of endocarditis  The various methods of treatment have been discussed with the patient and family. After consideration of risks, benefits and other options for treatment, the patient has consented to  Procedure(s): TRANSESOPHAGEAL ECHOCARDIOGRAM (TEE) (N/A) as a surgical intervention .  The patient's history has been reviewed, patient examined, no change in status, stable for surgery.  I have reviewed the patient's chart and labs.  Questions were answered to the patient's satisfaction.     Dorothy Spark

## 2013-10-28 NOTE — Progress Notes (Signed)
INITIAL NUTRITION ASSESSMENT  DOCUMENTATION CODES Per approved criteria  -Not Applicable   INTERVENTION: - Once diet upgraded, add Glucerna Shake po TID, each supplement provides 220 kcal and 10 grams of protein  NUTRITION DIAGNOSIS: Inadequate oral intake related to pacemaker infection as evidenced by reported intake less than estimated needs.   Goal: Pt to meet >/= 90% of their estimated nutrition needs   Monitor:  Weight trend, NPO, labs  Reason for Assessment: MST  80 y.o. male  Admitting Dx: Bacteremia  ASSESSMENT: 80 year old male with history of coronary artery disease, peripheral vascular disease, COPD, history of CVA, history of congestive heart failure with EF of 30%, who presents with a fever and falls.   - Pt with pacemake infection with enterococcal bacteremia. - Pt has had poor po ranging from 0-75%. He says that he does not have an appetite. Reports usual body weight as 160-170 lbs. Pt with moderate wasting of the temples and clavicle.  - Pt agreed to try nutritional supplements.   Labs: CBGs: 90-149 Na and K WNL  Height: Ht Readings from Last 1 Encounters:  10/22/13 5\' 9"  (1.753 m)    Weight: Wt Readings from Last 1 Encounters:  10/27/13 158 lb 6.4 oz (71.85 kg)    Ideal Body Weight: 160 lbs  % Ideal Body Weight: 99%  Wt Readings from Last 10 Encounters:  10/27/13 158 lb 6.4 oz (71.85 kg)  10/27/13 158 lb 6.4 oz (71.85 kg)  10/27/13 158 lb 6.4 oz (71.85 kg)  10/27/13 158 lb 6.4 oz (71.85 kg)  04/30/13 155 lb 4.8 oz (70.444 kg)  04/30/13 155 lb 4.8 oz (70.444 kg)  04/30/13 155 lb 4.8 oz (70.444 kg)  04/30/13 155 lb 4.8 oz (70.444 kg)  04/29/13 186 lb (84.369 kg)  01/02/13 186 lb (84.369 kg)    Usual Body Weight: 160-170 lbs  % Usual Body Weight: 99%  BMI:  Body mass index is 23.38 kg/(m^2).  Estimated Nutritional Needs: Kcal: 1900-2100 Protein: 100-115 g Fluid: 2.0 L/day  Skin: closed incisions on groin and right chest  Diet  Order: NPO  EDUCATION NEEDS: -Education needs addressed   Intake/Output Summary (Last 24 hours) at 10/28/13 1431 Last data filed at 10/28/13 0945  Gross per 24 hour  Intake    360 ml  Output    850 ml  Net   -490 ml    Last BM: 10/6   Labs:   Recent Labs Lab 10/23/13 0745 10/26/13 0505 10/27/13 0310 10/27/13 1018 10/27/13 1535 10/28/13 1255  NA 138 135* 138 138  --   --   K 4.4 4.4 4.1 4.3  --   --   CL 108 103 105  --   --   --   CO2 21 23 23   --   --   --   BUN 31* 32* 31*  --   --   --   CREATININE 1.70* 2.06* 2.04*  --  2.04*  --   CALCIUM 8.9 8.5 8.4  --   --   --   MG  --   --   --   --   --  1.7  GLUCOSE 90 88 122* 115*  --   --     CBG (last 3)   Recent Labs  10/27/13 2101 10/28/13 0752 10/28/13 1251  GLUCAP 149* 98 90    Scheduled Meds: . [MAR HOLD] sodium chloride   Intravenous Once  . [MAR HOLD] ampicillin (OMNIPEN) IV  2 g  Intravenous 4 times per day  . [facility] HOLD] aspirin EC  81 mg Oral Daily  . [facility] HOLD] carvedilol  6.25 mg Oral BID WC  . [MAR HOLD] cefTRIAXone (ROCEPHIN)  IV  2 g Intravenous Q24H  . [facility] HOLD] docusate sodium  100 mg Oral BID  . [facility] HOLD] ezetimibe  10 mg Oral Daily  . [facility] HOLD] furosemide  40 mg Oral Daily  . [facility] HOLD] heparin subcutaneous  5,000 Units Subcutaneous 3 times per day  . [facility] HOLD] hydrALAZINE  10 mg Oral 3 times per day  . [MAR HOLD] insulin aspart  0-15 Units Subcutaneous TID WC  . [MAR HOLD] insulin aspart  0-5 Units Subcutaneous QHS  . [MAR HOLD] insulin glargine  15 Units Subcutaneous QHS  . [MAR HOLD] isosorbide mononitrate  30 mg Oral Daily  . [MAR HOLD] linagliptin  5 mg Oral Daily  . [MAR HOLD] pantoprazole  40 mg Oral Daily  . [MAR HOLD] sodium chloride  3 mL Intravenous Q12H  . [facility] HOLD] vitamin B-12  1,000 mcg Oral Daily    Continuous Infusions: . sodium chloride 500 mL (10/28/13 1345)    Past Medical History  Diagnosis Date  . Coronary artery disease     Status post CABG in 2004   . Peripheral vascular disease     Status post right femoropopliteal bypass  . Dyslipidemia   . Diabetes mellitus with peripheral artery disease   . Hypertension   . Chronic renal insufficiency     creatinine around 2.4  . CHF (congestive heart failure) 2007    ef 38%  . History of CVA (cerebrovascular accident)   . Cardiomyopathy   . COPD (chronic obstructive pulmonary disease)   . History of anemia   . Myocardial infarction   . Shortness of breath   . Pacemaker   . Pneumonia   . Anginal pain   . Dementia   . GERD (gastroesophageal reflux disease)   . Arthritis     Past Surgical History  Procedure Laterality Date  . Insert / replace / remove pacemaker  05/17/2005    [facility] dual chamber ddd mode   . Coronary artery bypass graft  4 of 2004    4 vessel bypass  . Femoral bypass  july 2006    right with right great toe amputation  . Cardiac catheterization  04/29/2002    Dr. Glade Lloyd  . Esophagogastroduodenoscopy  07/21/2011    Procedure: ESOPHAGOGASTRODUODENOSCOPY (EGD);  Surgeon: Beryle Beams, MD;  Location: [facility];  Service: Endoscopy;  Laterality: N/A;  . Coronary angioplasty    . Femoral-popliteal bypass graft Left 05/01/2013    Procedure: LEFT FEMORAL TO ABOVE KNEE POPLITEAL ARTERY BYPASS GRAFT WITH INTRAOPERATIVE ARTEROGRAM;  Surgeon: Mal Misty, MD;  Location: [place];  Service: Vascular;  Laterality: Left;  . Amputation Left 05/05/2013    Procedure: AMPUTATION RAY- FIRST AND SECOND-LEFT FOOT;  Surgeon: Newt Minion, MD;  Location: [place];  Service: Orthopedics;  Laterality: Left;  . Tee without cardioversion N/A 10/23/2013    Procedure: TRANSESOPHAGEAL ECHOCARDIOGRAM (TEE);  Surgeon: Dorothy Spark, MD;  Location: [place];  Service: Cardiovascular;  Laterality: N/A;    Laurette Schimke RD, LDN

## 2013-10-28 NOTE — Progress Notes (Signed)
Echocardiogram Echocardiogram Transesophageal has been performed.  Joelene Millin 10/28/2013, 2:18 PM

## 2013-10-28 NOTE — CV Procedure (Signed)
     Transesophageal Echocardiogram Note  ANKER KRAVEC [id number] 1928/09/04  Procedure: Transesophageal Echocardiogram Indications: Post pacing leads removal, evaluate for residual insulation  Procedure Details Consent: Obtained Time Out: Verified patient identification, verified procedure, site/side was marked, verified correct patient position, special equipment/implants available, Radiology Safety Procedures followed,  medications/allergies/relevent history reviewed, required imaging and test results available.  Performed  Medications: Fentanyl: 25 mcg  Versed: 2 mg  There is no remnant insulation on the retained portion of the apival RV lead. The retained RV lead measures 3 cm. There is no vegetation on the tricuspid or pulmonic valve. Mild TR and PR. Central line in the right atrium with no vegetation.   Complications: No apparent complications Patient did tolerate procedure well.  Dorothy Spark, MD, [facility] 10/28/2013, 2:56 PM

## 2013-10-28 NOTE — Progress Notes (Signed)
Paged Dr. Eliseo Squires per central tele pt had 4 beat run of V-tach with some couplets.

## 2013-10-28 NOTE — Progress Notes (Signed)
Dr. Eliseo Squires V/O give coreg 6.25 and she would notify EP Dr Lovena Le

## 2013-10-28 NOTE — H&P (View-Only) (Signed)
Patient ID: Colin [name], male   DOB: 27-May-1928, 79 y.o.   MRN: [id number]    Subjective:   No complaints this AM   Objective:   Temp:  [97.9 F (36.6 C)-99 F (37.2 C)] 98.5 F (36.9 C) (10/04 0714) Pulse Rate:  [54-124] 65 (10/04 0714) Resp:  [17-18] 18 (10/04 0714) BP: (109-144)/(41-55) 141/44 mmHg (10/04 0714) SpO2:  [95 %-100 %] 98 % (10/04 0714) Weight:  [158 lb 6.4 oz (71.85 kg)] 158 lb 6.4 oz (71.85 kg) (10/03 2129) Last BM Date: 10/25/13  Filed Weights   10/23/13 2125 10/24/13 2035 10/25/13 2129  Weight: 162 lb 14.7 oz (73.9 kg) 165 lb 9.1 oz (75.1 kg) 158 lb 6.4 oz (71.85 kg)    Intake/Output Summary (Last 24 hours) at 10/26/13 0746 Last data filed at 10/26/13 0659  Gross per 24 hour  Intake    880 ml  Output   1500 ml  Net   -620 ml    Telemetry: none  Exam:  General: NAD  Resp: CTAB  Cardiac: RRR, no m/r/g, no JVD, no carotid bruits  [id number] soft, NT, ND  MSK: no LE edema  Neuro: no focal deficits  Psych: appropriate affect  Lab Results:  Basic Metabolic Panel:  Recent Labs Lab 10/21/13 0554 10/23/13 0745 10/26/13 0505  NA 139 138 135*  K 5.2 4.4 4.4  CL 109 108 103  CO2 20 21 23   GLUCOSE 108* 90 88  BUN 49* 31* 32*  CREATININE 1.80* 1.70* 2.06*  CALCIUM 8.7 8.9 8.5    Liver Function Tests:  Recent Labs Lab 10/20/13 0532  AST 20  ALT 14  ALKPHOS 65  BILITOT 0.3  PROT 6.9  ALBUMIN 2.2*    CBC:  Recent Labs Lab 10/21/13 0554 10/23/13 0745 10/26/13 0505  WBC 9.0 5.9 5.7  HGB 9.0* 8.2* 6.8*  HCT 28.3* 26.2* 21.9*  MCV 76.3* 79.2 80.2  PLT 218 243 251    Cardiac Enzymes:  Recent Labs Lab 10/19/13 1200 10/19/13 1707 10/19/13 2208  TROPONINI 0.59* 0.39* <0.30    BNP:  Recent Labs  10/19/13 1200  PROBNP 2214.0*    Coagulation: No results found for this basename: INR,  in the last 168 hours  ECG:   Medications:   Scheduled Medications: . sodium chloride   Intravenous Once  . ampicillin  (OMNIPEN) IV  2 g Intravenous 4 times per day  . aspirin EC  81 mg Oral Daily  . carvedilol  6.25 mg Oral BID WC  . cefTRIAXone (ROCEPHIN)  IV  2 g Intravenous Q12H  . docusate sodium  100 mg Oral BID  . ezetimibe  10 mg Oral Daily  . furosemide  40 mg Oral Daily  . hydrALAZINE  10 mg Oral 3 times per day  . insulin aspart  0-15 Units Subcutaneous TID WC  . insulin aspart  0-5 Units Subcutaneous QHS  . insulin glargine  15 Units Subcutaneous QHS  . isosorbide mononitrate  30 mg Oral Daily  . linagliptin  5 mg Oral Daily  . pantoprazole  40 mg Oral Daily  . sodium chloride  3 mL Intravenous Q12H  . vitamin B-12  1,000 mcg Oral Daily     Infusions: . sodium chloride 20 mL/hr at 10/25/13 1202     PRN Medications:  acetaminophen, acetaminophen, levalbuterol, ondansetron (ZOFRAN) IV, ondansetron     Assessment/Plan    1. Enterococcus Bacteremia/Pacemaker lead endocarditis: With 4 x 25 mm vegetation attached to the right  atrial portion of the RV pacer lead. Seen by EP with plans for lead extraction on Monday. Abx per IM/ID.  - will make NPO tonight at midnight  2. CAD: No chest pain or sob.  - continue medical therapy.   3. Chronic systolic CHF  - echo [id number] LVEF 30-35%  - he is on coreg, no ACE-I due to CKD. He is on hdral/nitrates - on oral lasix 40mg  daily, from I/Os patient neg 620 mL yesterday.Baseline Cr 2-2.4 from prior labs, still within that range - No symptoms at this time, by exam does not appear volume overloaded.   4. CKD III:  - from past labs baseline appears to be between 2 to 2.4, mild uptrend yesterday but still within range. - continue to montior  5. Anemia - per primary team          Carlyle Dolly, M.D.

## 2013-10-28 NOTE — Progress Notes (Signed)
PT Cancellation Note  Patient Details Name: HAMMIE SPANBAUER MRN: [id number] DOB: 05-23-1928   Cancelled Treatment:    Reason Eval/Treat Not Completed: Patient at procedure or test/unavailable  At TEE;  Will follow up later today as time allows;  Otherwise, will follow up for PT tomorrow;   Thank you,  Roney Marion, [place] Pager [phone] Office [phone]     Roney Marion [facility] 10/28/2013, 3:06 PM

## 2013-10-28 NOTE — Progress Notes (Signed)
[place] for Infectious Disease  Date of Admission:  10/19/2013  Antibiotics: Ampicillin and ceftriaxone  Subjective: No complaints, s/p explant   Objective: Temp:  [97.7 F (36.5 C)-99.6 F (37.6 C)] 99.4 F (37.4 C) (10/06 0558) Pulse Rate:  [50-69] 62 (10/06 0558) Resp:  [12-25] 18 (10/06 0558) BP: (120-146)/(40-71) 144/48 mmHg (10/06 0558) SpO2:  [94 %-99 %] 99 % (10/06 0558) Arterial Line BP: (144-150)/(33-39) 144/33 mmHg (10/05 1100) Weight:  [158 lb 6.4 oz (71.85 kg)] 158 lb 6.4 oz (71.85 kg) (10/05 2057)  General: awake, in bed, nad Skin: no rashes Lungs: CTA B Cor: RRR Abdomen: soft, nt   Lab Results Lab Results  Component Value Date   WBC 6.7 10/27/2013   HGB 7.9* 10/27/2013   HCT 24.9* 10/27/2013   MCV 76.9* 10/27/2013   PLT 256 10/27/2013    Lab Results  Component Value Date   CREATININE 2.04* 10/27/2013   BUN 31* 10/27/2013   NA 138 10/27/2013   K 4.3 10/27/2013   CL 105 10/27/2013   CO2 23 10/27/2013    Lab Results  Component Value Date   ALT 14 10/20/2013   AST 20 10/20/2013   ALKPHOS 65 10/20/2013   BILITOT 0.3 10/20/2013      Microbiology: Recent Results (from the past 240 hour(s))  CULTURE, BLOOD (ROUTINE X 2)     Status: None   Collection Time    10/19/13  6:52 AM      Result Value Ref Range Status   Specimen Description BLOOD RIGHT ARM   Final   Special Requests BOTTLES DRAWN AEROBIC AND ANAEROBIC 5CC EA   Final   Culture  Setup Time     Final   Value: 10/19/2013 13:29     Performed at [facility]   Culture     Final   Value: ENTEROCOCCUS SPECIES     Note: SUSCEPTIBILITIES PERFORMED ON PREVIOUS CULTURE WITHIN THE LAST 5 DAYS.     Note: Gram Stain Report Called to,Read Back By and Verified With: JAY NARAMDAS @ [id number] ON [id number] BY [facility]     Performed at [facility]   Report Status 10/23/2013 FINAL   Final  CULTURE, BLOOD (ROUTINE X 2)     Status: None   Collection Time    10/19/13  6:58 AM      Result Value Ref  Range Status   Specimen Description BLOOD LEFT ARM   Final   Special Requests BOTTLES DRAWN AEROBIC ONLY 2CC   Final   Culture  Setup Time     Final   Value: 10/19/2013 13:29     Performed at [facility]   Culture     Final   Value: ENTEROCOCCUS SPECIES     Note: Gram Stain Report Called to,Read Back By and Verified With: KAMI MOORE ON 10/20/2013 AT 10:25P BY WILEJ     Performed at [facility]   Report Status 10/23/2013 FINAL   Final   Organism ID, Bacteria ENTEROCOCCUS SPECIES   Final  URINE CULTURE     Status: None   Collection Time    10/19/13  9:12 AM      Result Value Ref Range Status   Specimen Description URINE, RANDOM   Final   Special Requests NONE   Final   Culture  Setup Time     Final   Value: 10/19/2013 17:01     Performed at [place]  Final   Value: >=100,000 COLONIES/ML     Performed at [facility]   Culture     Final   Value: Multiple bacterial morphotypes present, none predominant. Suggest appropriate recollection if clinically indicated.     Performed at [facility]   Report Status 10/20/2013 FINAL   Final  CULTURE, BLOOD (ROUTINE X 2)     Status: None   Collection Time    10/23/13  7:40 AM      Result Value Ref Range Status   Specimen Description BLOOD LEFT HAND   Final   Special Requests BOTTLES DRAWN AEROBIC ONLY 3CC   Final   Culture  Setup Time     Final   Value: 10/23/2013 12:54     Performed at [facility]   Culture     Final   Value:        BLOOD CULTURE RECEIVED NO GROWTH TO DATE CULTURE WILL BE HELD FOR 5 DAYS BEFORE ISSUING A FINAL NEGATIVE REPORT     Performed at [facility]   Report Status PENDING   Incomplete  CULTURE, BLOOD (ROUTINE X 2)     Status: None   Collection Time    10/23/13  7:45 AM      Result Value Ref Range Status   Specimen Description BLOOD LEFT HAND   Final   Special Requests BOTTLES DRAWN AEROBIC ONLY 4CC   Final   Culture  Setup Time      Final   Value: 10/23/2013 12:54     Performed at [facility]   Culture     Final   Value:        BLOOD CULTURE RECEIVED NO GROWTH TO DATE CULTURE WILL BE HELD FOR 5 DAYS BEFORE ISSUING A FINAL NEGATIVE REPORT     Performed at [facility]   Report Status PENDING   Incomplete  SURGICAL PCR SCREEN     Status: None   Collection Time    10/27/13  3:30 AM      Result Value Ref Range Status   MRSA, PCR NEGATIVE  NEGATIVE Final   Staphylococcus aureus NEGATIVE  NEGATIVE Final   Comment:            The Xpert SA Assay (FDA     approved for NASAL specimens     in patients over 80 years of age),     is one component of     a comprehensive surveillance     program.  Test performance has     been validated by [employer] for patients greater     than or equal to 72 year old.     It is not intended     to diagnose infection nor to     guide or monitor treatment.    Studies/Results: Dg Chest 2 View  10/28/2013   CLINICAL DATA:  Infection and inflammatory reaction due to cardiac device, implant, or graft, initial encounter, cough, type 2 diabetes mellitus with peripheral artery disease, hypertension, chronic renal insufficiency, CHF, cardiomyopathy not otherwise specified, COPD, coronary artery disease post MI, former smoker  EXAM: CHEST  2 VIEW  COMPARISON:  10/19/2013  FINDINGS: Enlargement of cardiac silhouette post CABG.  Calcified tortuous aorta.  Interval removal of RIGHT subclavian pacemaker.  Eventrations of the posterior hemidiaphragms bilaterally with bibasilar atelectasis.  Minimal central peribronchial thickening.  Question underlying emphysematous changes.  No acute infiltrate or pneumothorax.  Probable small  bibasilar effusions.  Bones unremarkable.  IMPRESSION: Enlargement of cardiac silhouette post CABG and pacemaker removal.  Bibasilar atelectasis and probable small pleural effusions.  Question underlying emphysematous changes.   Electronically Signed   By: Lavonia Dana M.D.   On: 10/28/2013 09:04    Assessment/Plan: 1) pacemaker infection with Enterococcal bacteremia - on dual beta lactam therapy.  Extraction done yesterday.  Repeat blood cultures ngtd.   -will need 6 weeks of ceftriaxone 2 grams once a day and ampicillin every 6 hours through Nov 15th.   -to get TEE to view the retained leads to see if any concerns -will need central line, going to SNF  Scharlene Gloss, [place] for Infectious Disease [place] [email or web address] [phone] pager   [phone] cell 10/28/2013, 10:35 AM

## 2013-10-28 NOTE — Progress Notes (Signed)
Cancel/Procedure:  Pt. Not seen for skilled OT this day secondary to being off of floor for procedure.  Will re-attempt as pt. Able  Romana Juniper, COTA/L

## 2013-10-28 NOTE — Progress Notes (Signed)
ELECTROPHYSIOLOGY ROUNDING NOTE    Patient Name: Colin [name] Date of Encounter: 10/28/2013    SUBJECTIVE:Patient without chest pain or shortness of breath this morning.   S/p PPM system extraction 10-27-13 with retained portion of RV lead - pending TEE today to see if any insulation persists in the RV or RA.   TELEMETRY: Pt not currently on telemetry - order placed to resume  Filed Vitals:   10/27/13 1257 10/27/13 1748 10/27/13 2057 10/28/13 0558  BP: 146/50 137/71 130/50 144/48  Pulse: 50 59 69 62  Temp: 97.7 F (36.5 C) 98.4 F (36.9 C) 99.6 F (37.6 C) 99.4 F (37.4 C)  TempSrc: Oral Oral Oral Oral  Resp: 18 19 18 18   Height:      Weight:   158 lb 6.4 oz (71.85 kg)   SpO2: 98% 99% 96% 99%    Intake/Output Summary (Last 24 hours) at 10/28/13 0738 Last data filed at 10/28/13 0559  Gross per 24 hour  Intake   1110 ml  Output   1850 ml  Net   -740 ml    CURRENT MEDICATIONS: . sodium chloride   Intravenous Once  . ampicillin (OMNIPEN) IV  2 g Intravenous 4 times per day  . aspirin EC  81 mg Oral Daily  . carvedilol  6.25 mg Oral BID WC  . cefTRIAXone (ROCEPHIN)  IV  2 g Intravenous Q24H  . docusate sodium  100 mg Oral BID  . ezetimibe  10 mg Oral Daily  . furosemide  40 mg Oral Daily  . heparin subcutaneous  5,000 Units Subcutaneous 3 times per day  . hydrALAZINE  10 mg Oral 3 times per day  . insulin aspart  0-15 Units Subcutaneous TID WC  . insulin aspart  0-5 Units Subcutaneous QHS  . insulin glargine  15 Units Subcutaneous QHS  . isosorbide mononitrate  30 mg Oral Daily  . linagliptin  5 mg Oral Daily  . pantoprazole  40 mg Oral Daily  . sodium chloride  3 mL Intravenous Q12H  . vitamin B-12  1,000 mcg Oral Daily    LABS: Basic Metabolic Panel:  Recent Labs  10/26/13 0505 10/27/13 0310 10/27/13 1018 10/27/13 1535  NA 135* 138 138  --   K 4.4 4.1 4.3  --   CL 103 105  --   --   CO2 23 23  --   --   GLUCOSE 88 122* 115*  --   BUN 32* 31*   --   --   CREATININE 2.06* 2.04*  --  2.04*  CALCIUM 8.5 8.4  --   --    CBC:  Recent Labs  10/26/13 0505 10/27/13 0310 10/27/13 1018 10/27/13 1535  WBC 5.7 4.9  --  6.7  NEUTROABS 3.4  --   --   --   HGB 6.8* 7.2* 7.1* 7.9*  HCT 21.9* 22.5* 21.0* 24.9*  MCV 80.2 77.3*  --  76.9*  PLT 251 257  --  256     Radiology/Studies:  Dg Chest 2 View 10/19/2013   CLINICAL DATA:  Fever, confusion and fall.  EXAM: CHEST  2 VIEW  COMPARISON:  04/30/2013  FINDINGS: Stable appearance of the right dual lead cardiac pacemaker. Heart and mediastinum are stable. Post CABG changes in the chest. There is mild elevation of the posterior hemidiaphragms bilaterally and this is similar to the prior examination. No evidence for airspace disease or pleural effusions.  IMPRESSION: No acute cardiopulmonary disease.  Electronically Signed   By: Markus Daft M.D.   On: 10/19/2013 07:59    PHYSICAL EXAM stable appearing elderly man, NAD HEENT: Unremarkable,[place], AT Neck:  6 JVD, no thyromegally Back:  No CVA tenderness Lungs:  Clear with no wheezes, rales, or rhonchi HEART:  Regular rate rhythm, no murmurs, no rubs, no clicks Abd:  soft, positive bowel sounds, no organomegally, no rebound, no guarding Ext:  2 plus pulses, no edema, no cyanosis, no clubbing Skin:  No rashes no nodules Neuro:  CN II through XII intact, motor grossly intact  Tele - discontinued    Principal Problem:   Bacteremia Active Problems:   CORONARY ARTERY DISEASE   Cardiac pacemaker in situ   Chronic systolic CHF (congestive heart failure)   Peripheral vascular disease, unspecified   CKD (chronic kidney disease), stage III   Fall   Type 2 diabetes, uncontrolled, with renal manifestation   Weakness generalized  Plan - With retained tip of RV lead in the RV, will plan to proceed with TEE to look for retained insulation in the RV/RA. If none, he should be able to have his endocarditis cured. If retained insulation is present, he  will either need lifelong suppressive anti-biotics or be referred for extraction of his RV lead tip.  Mikle Bosworth.D.

## 2013-10-29 ENCOUNTER — Encounter (HOSPITAL_COMMUNITY): Payer: Self-pay | Admitting: Cardiology

## 2013-10-29 ENCOUNTER — Inpatient Hospital Stay (HOSPITAL_COMMUNITY): Payer: Medicare Other

## 2013-10-29 DIAGNOSIS — Y849 Medical procedure, unspecified as the cause of abnormal reaction of the patient, or of later complication, without mention of misadventure at the time of the procedure: Secondary | ICD-10-CM

## 2013-10-29 DIAGNOSIS — E1129 Type 2 diabetes mellitus with other diabetic kidney complication: Secondary | ICD-10-CM

## 2013-10-29 DIAGNOSIS — D62 Acute posthemorrhagic anemia: Secondary | ICD-10-CM | POA: Diagnosis present

## 2013-10-29 DIAGNOSIS — E1165 Type 2 diabetes mellitus with hyperglycemia: Secondary | ICD-10-CM

## 2013-10-29 DIAGNOSIS — D649 Anemia, unspecified: Secondary | ICD-10-CM

## 2013-10-29 DIAGNOSIS — D631 Anemia in chronic kidney disease: Secondary | ICD-10-CM

## 2013-10-29 DIAGNOSIS — N189 Chronic kidney disease, unspecified: Secondary | ICD-10-CM

## 2013-10-29 HISTORY — DX: Anemia, unspecified: D64.9

## 2013-10-29 LAB — GLUCOSE, CAPILLARY
GLUCOSE-CAPILLARY: 104 mg/dL — AB (ref 70–99)
Glucose-Capillary: 143 mg/dL — ABNORMAL HIGH (ref 70–99)
Glucose-Capillary: 173 mg/dL — ABNORMAL HIGH (ref 70–99)
Glucose-Capillary: 118 mg/dL — ABNORMAL HIGH (ref 70–99)

## 2013-10-29 LAB — CULTURE, BLOOD (ROUTINE X 2)
CULTURE: NO GROWTH
Culture: NO GROWTH

## 2013-10-29 LAB — BASIC METABOLIC PANEL
Anion gap: 8 (ref 5–15)
BUN: 30 mg/dL — ABNORMAL HIGH (ref 6–23)
CO2: 25 mEq/L (ref 19–32)
Calcium: 8.5 mg/dL (ref 8.4–10.5)
Chloride: 105 mEq/L (ref 96–112)
Creatinine, Ser: 2.06 mg/dL — ABNORMAL HIGH (ref 0.50–1.35)
GFR calc Af Amer: 33 mL/min — ABNORMAL LOW (ref >90)
GFR calc non Af Amer: 28 mL/min — ABNORMAL LOW (ref >90)
Glucose, Bld: 122 mg/dL — ABNORMAL HIGH (ref 70–99)
Potassium: 4.1 mEq/L (ref 3.7–5.3)
Sodium: 138 mEq/L (ref 137–147)

## 2013-10-29 LAB — CBC
HCT: 22.5 % — ABNORMAL LOW (ref 39.0–52.0)
Hemoglobin: 7 g/dL — ABNORMAL LOW (ref 13.0–17.0)
MCH: 25.2 pg — ABNORMAL LOW (ref 26.0–34.0)
MCHC: 31.1 g/dL (ref 30.0–36.0)
MCV: 80.9 fL (ref 78.0–100.0)
Platelets: 253 10*3/uL (ref 150–400)
RBC: 2.78 MIL/uL — ABNORMAL LOW (ref 4.22–5.81)
RDW: 17 % — ABNORMAL HIGH (ref 11.5–15.5)
WBC: 5.6 10*3/uL (ref 4.0–10.5)

## 2013-10-29 MED ORDER — DEXTROSE 5 % IV SOLN
2.0000 g | Freq: Two times a day (BID) | INTRAVENOUS | Status: DC
  Administered 2013-10-29 – 2013-10-31 (×5): 2 g via INTRAVENOUS
  Filled 2013-10-29 (×7): qty 2

## 2013-10-29 MED ORDER — LIDOCAINE HCL 1 % IJ SOLN
INTRAMUSCULAR | Status: AC
  Filled 2013-10-29: qty 20

## 2013-10-29 NOTE — Progress Notes (Signed)
Physical Therapy Treatment Patient Details Name: Colin [name] MRN: [id number] DOB: 02-25-1928 Today's Date: 10/29/2013    History of Present Illness Colin Bruins. [name] is an 80 y.o. male admitted 10/19/13 with fever and falls at home. Pt reports low-grade fever, weakness, poor PO intake, myalgia, HA, and neck pain for 1-2 weeks on admission. PMH of CAD, PVD, hx CVA, CHF. Pt was febrile in ED.     PT Comments    Initially hesitant to get up and walk, but easily encouraged (and redirected), and agreeable to walking; Able to incr amb distance, though noted fatigue at the end of our walk  Follow Up Recommendations  SNF;Supervision/Assistance - 24 hour     Equipment Recommendations  3in1 (PT)    Recommendations for Other Services       Precautions / Restrictions Precautions Precautions: Fall Precaution Comments: right forefoot ambutation and left great and 2nd toe amputation Required Braces or Orthoses: Other Brace/Splint Other Brace/Splint: Used pt's prosthetic shoes today Restrictions Weight Bearing Restrictions: No    Mobility  Bed Mobility Overal bed mobility: Needs Assistance Bed Mobility: Supine to Sit     Supine to sit: Supervision     General bed mobility comments: Supervision for management of foley  Transfers Overall transfer level: Needs assistance Equipment used: Rolling walker (2 wheeled) Transfers: Sit to/from Stand Sit to Stand: Min guard         General transfer comment: Minguard assist for steadiness; Good prep for standing by movng feet closer to bed and leaning forward  Ambulation/Gait Ambulation/Gait assistance: Min guard Ambulation Distance (Feet): 100 Feet Assistive device: Rolling walker (2 wheeled) Gait Pattern/deviations: Decreased step length - left;Decreased stance time - right;Trunk flexed     General Gait Details: Cues for upright posture and to keep rW close; noted pt less talkative as distance walked incr; it seems he became more  ansious as he became more fatigued   Stairs            Wheelchair Mobility    Modified Rankin (Stroke Patients Only)       Balance                                    Cognition Arousal/Alertness: Awake/alert Behavior During Therapy: WFL for tasks assessed/performed Overall Cognitive Status: History of cognitive impairments - at baseline       Memory: Decreased short-term memory              Exercises      General Comments        Pertinent Vitals/Pain Pain Assessment: No/denies pain    Home Living                      Prior Function            PT Goals (current goals can now be found in the care plan section) Acute Rehab PT Goals Patient Stated Goal: Really wanting tto go home PT Goal Formulation: With patient Time For Goal Achievement: 11/03/13 Potential to Achieve Goals: Good Progress towards PT goals: Progressing toward goals    Frequency  Min 3X/week    PT Plan Current plan remains appropriate    Co-evaluation             End of Session Equipment Utilized During Treatment: Gait belt Activity Tolerance: Patient tolerated treatment well Patient left: in chair;with call bell/phone within reach;with chair  alarm set     Time: [id number] PT Time Calculation (min): 26 min  Charges:  $Gait Training: 23-37 mins                    G Codes:      Quin Hoop 10/29/2013, 9:31 AM  Roney Marion, [place] Pager [phone] Office [phone]

## 2013-10-29 NOTE — Procedures (Signed)
Procedure:  Tunneled CVC placement Access:  Left IJ vein 27 cm, 6 Fr tunneled Power Line placed via left IJ vein.  Tip at cavoatrial junction.  OK to use.

## 2013-10-29 NOTE — Progress Notes (Signed)
Chart reviewed.    PROGRESS NOTE  Colin [name] [id number] DOB: 09/17/1928 DOA: 10/19/2013 PCP: Horatio Pel, MD  80 year old male with history of coronary artery disease, peripheral vascular disease, COPD, history of CVA, history of congestive heart failure with EF of 30%, who presents with a fever and falls. Apparently the patient has had intermittent low-grade fever, weakness, poor oral intake, myalgia, headache and neck pain Found to have infection on his pacer wire.  Pacemaker was removed 10/5 but small portion remained.  Patient had TEE again on 10/6.  If retained insulation is present, he will either need lifelong suppressive anti-biotics or be referred for extraction of his RV lead tip   Assessment/Plan:  Enterococcus Bacteremia/Pacemaker lead endocarditis:  TEE:  4 x 25 mm vegetation attached to the right atrial portion of the RV pacer lead. S/p extraction. Repeat TEE shows no remnant insulation or vegetation. -ID consult- needs 6 weeks of IV abx: weeks of ceftriaxone 2 grams once a day and ampicillin every 6 hours through Nov 15th Central line in.  Acute blood loss Anemia- suspected related to bleed with IV s/p PRBCs. Drifting down. Repeat CBC and check anemia profile    CORONARY ARTERY DISEASE    Chronic systolic CHF (congestive heart failure): compensated    Peripheral vascular disease, unspecified   acute renal failue with CKD (chronic kidney disease), stage III. BUN down. Creat down. Saline lock    Falls at home, multiple. SNF soon if stable    Type 2 diabetes, better controlled  protien calorie malnutrition  Code Status:  full Family Communication:   Disposition Plan:  SNF  Consultants:  EP  Cards  ID  IR  Procedures:   TEE x2  Pacemaker extraction  tunnelled left IJ CVC  HPI/Subjective: No complaints. No bleeding  Objective: Filed Vitals:   10/29/13 1801  BP: 142/80  Pulse: 84  Temp: 98.5 F (36.9 C)  Resp: 18     Intake/Output Summary (Last 24 hours) at 10/29/13 1848 Last data filed at 10/29/13 1808  Gross per 24 hour  Intake    600 ml  Output   1100 ml  Net   -500 ml   Filed Weights   10/26/13 2039 10/27/13 2057 10/28/13 2041  Weight: 71.85 kg (158 lb 6.4 oz) 71.85 kg (158 lb 6.4 oz) 71.85 kg (158 lb 6.4 oz)    Exam:   General:  Frail, elderly. comfortable  Cardiovascular: RRR without MGR. Dressings CDI  Respiratory: CTA without WRR  Abdomen: s, nt, nd  Ext: no CCE  Basic Metabolic Panel:  Recent Labs Lab 10/23/13 0745 10/26/13 0505 10/27/13 0310 10/27/13 1018 10/27/13 1535 10/28/13 1255 10/29/13 0550  NA 138 135* 138 138  --   --  138  K 4.4 4.4 4.1 4.3  --   --  4.1  CL 108 103 105  --   --   --  105  CO2 21 23 23   --   --   --  25  GLUCOSE 90 88 122* 115*  --   --  122*  BUN 31* 32* 31*  --   --   --  30*  CREATININE 1.70* 2.06* 2.04*  --  2.04*  --  2.06*  CALCIUM 8.9 8.5 8.4  --   --   --  8.5  MG  --   --   --   --   --  1.7  --    Liver Function Tests: No results found  for this basename: AST, ALT, ALKPHOS, BILITOT, PROT, ALBUMIN,  in the last 168 hours No results found for this basename: LIPASE, AMYLASE,  in the last 168 hours No results found for this basename: AMMONIA,  in the last 168 hours CBC:  Recent Labs Lab 10/23/13 0745 10/26/13 0505 10/27/13 0310 10/27/13 1018 10/27/13 1535 10/29/13 0550  WBC 5.9 5.7 4.9  --  6.7 5.6  NEUTROABS  --  3.4  --   --   --   --   HGB 8.2* 6.8* 7.2* 7.1* 7.9* 7.0*  HCT 26.2* 21.9* 22.5* 21.0* 24.9* 22.5*  MCV 79.2 80.2 77.3*  --  76.9* 80.9  PLT 243 251 257  --  256 253   Cardiac Enzymes: No results found for this basename: CKTOTAL, CKMB, CKMBINDEX, TROPONINI,  in the last 168 hours BNP (last 3 results)  Recent Labs  10/19/13 1200  PROBNP 2214.0*   CBG:  Recent Labs Lab 10/28/13 1251 10/28/13 1651 10/28/13 2045 10/29/13 0756 10/29/13 1137  GLUCAP 90 80 234* 104* 143*    Recent Results  (from the past 240 hour(s))  CULTURE, BLOOD (ROUTINE X 2)     Status: None   Collection Time    10/23/13  7:40 AM      Result Value Ref Range Status   Specimen Description BLOOD LEFT HAND   Final   Special Requests BOTTLES DRAWN AEROBIC ONLY 3CC   Final   Culture  Setup Time     Final   Value: 10/23/2013 12:54     Performed at [facility]   Culture     Final   Value: NO GROWTH 5 DAYS     Performed at [facility]   Report Status 10/29/2013 FINAL   Final  CULTURE, BLOOD (ROUTINE X 2)     Status: None   Collection Time    10/23/13  7:45 AM      Result Value Ref Range Status   Specimen Description BLOOD LEFT HAND   Final   Special Requests BOTTLES DRAWN AEROBIC ONLY 4CC   Final   Culture  Setup Time     Final   Value: 10/23/2013 12:54     Performed at [facility]   Culture     Final   Value: NO GROWTH 5 DAYS     Performed at [facility]   Report Status 10/29/2013 FINAL   Final  SURGICAL PCR SCREEN     Status: None   Collection Time    10/27/13  3:30 AM      Result Value Ref Range Status   MRSA, PCR NEGATIVE  NEGATIVE Final   Staphylococcus aureus NEGATIVE  NEGATIVE Final   Comment:            The Xpert SA Assay (FDA     approved for NASAL specimens     in patients over 21 years of age),     is one component of     a comprehensive surveillance     program.  Test performance has     been validated by [employer] for patients greater     than or equal to 17 year old.     It is not intended     to diagnose infection nor to     guide or monitor treatment.     Studies: Dg Chest 2 View  10/28/2013   CLINICAL DATA:  Infection and inflammatory reaction due to  cardiac device, implant, or graft, initial encounter, cough, type 2 diabetes mellitus with peripheral artery disease, hypertension, chronic renal insufficiency, CHF, cardiomyopathy not otherwise specified, COPD, coronary artery disease post MI, former smoker  EXAM: CHEST  2 VIEW   COMPARISON:  10/19/2013  FINDINGS: Enlargement of cardiac silhouette post CABG.  Calcified tortuous aorta.  Interval removal of RIGHT subclavian pacemaker.  Eventrations of the posterior hemidiaphragms bilaterally with bibasilar atelectasis.  Minimal central peribronchial thickening.  Question underlying emphysematous changes.  No acute infiltrate or pneumothorax.  Probable small bibasilar effusions.  Bones unremarkable.  IMPRESSION: Enlargement of cardiac silhouette post CABG and pacemaker removal.  Bibasilar atelectasis and probable small pleural effusions.  Question underlying emphysematous changes.   Electronically Signed   By: Lavonia Dana M.D.   On: 10/28/2013 09:04   Echo Left ventricle: The cavity size was normal. Wall thickness was increased in a pattern of mild LVH. Systolic function was moderately to severely reduced. The estimated ejection fraction was in the range of 30% to 35%. Diffuse hypokinesis. Doppler parameters are consistent with abnormal left ventricular relaxation (grade 1 diastolic dysfunction). - Ventricular septum: Septal motion showed abnormal function and dyssynergy. - Aortic valve: Trileaflet; moderately calcified leaflets. There was trivial regurgitation. - Mitral valve: There was trivial regurgitation. - Left atrium: The atrium was moderately dilated. - Right ventricle: Pacer wire or catheter noted in right ventricle. Systolic function was reduced. - Right atrium: The atrium was mildly dilated. Pacer wire or catheter noted in right atrium. Central venous pressure (est): 15 mm Hg. - Atrial septum: No defect or patent foramen ovale was identified. - Tricuspid valve: Mildly thickened leaflets. There was mild regurgitation. - Pulmonary arteries: PA peak pressure: 39 mm Hg (S). - Pericardium, extracardiac: There was no pericardial effusion.  Impressions:  - Mild LVH with LVEF approximately 30-35%, septal dyssynergy, grade 1 diastolic dysfunction. Sclerotic aortic  valve with trivial aortic regurgitation. Device wire noted in right heart. Reduced RV contraction. Thickened tricuspid valve with mild tricuspid regurgitation and PASP 39 mmHg. No obvious vegetations noted - shadowing in region of tricuspid valve likely secondary to device wire. If high suspicion of endocarditis, TEE could be considered.   Scheduled Meds: . sodium chloride   Intravenous Once  . ampicillin (OMNIPEN) IV  2 g Intravenous 4 times per day  . aspirin EC  81 mg Oral Daily  . carvedilol  6.25 mg Oral BID WC  . cefTRIAXone (ROCEPHIN)  IV  2 g Intravenous Q12H  . docusate sodium  100 mg Oral BID  . ezetimibe  10 mg Oral Daily  . furosemide  40 mg Oral Daily  . heparin subcutaneous  5,000 Units Subcutaneous 3 times per day  . hydrALAZINE  10 mg Oral 3 times per day  . insulin aspart  0-15 Units Subcutaneous TID WC  . insulin aspart  0-5 Units Subcutaneous QHS  . insulin glargine  15 Units Subcutaneous QHS  . isosorbide mononitrate  30 mg Oral Daily  . lidocaine      . linagliptin  5 mg Oral Daily  . pantoprazole  40 mg Oral Daily  . sodium chloride  3 mL Intravenous Q12H  . vitamin B-12  1,000 mcg Oral Daily   Continuous Infusions:    Time spent: 25 minutes  [place] [facility] Pager [phone]. If 7PM-7AM, please contact night-coverage at [email or web address], password [facility] 10/29/2013, 6:48 PM  LOS: 10 days

## 2013-10-29 NOTE — Care Management Note (Signed)
CARE MANAGEMENT NOTE 10/29/2013  Patient:  Colin [name], Colin [name]   Account Number:  [id number]  Date Initiated:  10/22/2013  Documentation initiated by:  Tabak,Demetria Lightsey  Subjective/Objective Assessment:   CM following for progression and d/c planning.     Action/Plan:   10/22/2013 Met with pt and IM given , pt alert, oriented. Will continue to follow.  10/29/13 Met with pt IM and explained again. Will continue to follow, plan for central line for IV antibiotics and SNF.   Anticipated DC Date:     Anticipated DC Plan:  SKILLED NURSING FACILITY         Choice offered to / List presented to:             Status of service:   Medicare Important Message given?  YES (If response is "NO", the following Medicare IM given date fields will be blank) Date Medicare IM given:  10/22/2013 Medicare IM given by:  Kittler,Chelsy Parrales Date Additional Medicare IM given:  10/24/2013 Additional Medicare IM given by:  [facility]  Discharge Disposition:    Per UR Regulation:    If discussed at Long Length of Stay Meetings, dates discussed:    Comments:  10/29/2013 IM given and explained to pt again, pt seems anxious to d/c , however understands that he needs a central line for IV antibiotics.   CRoyal RN MPH, case manager, [phone]  10/24/2013 Met with pt , ongoing medical concerns, not ready to d/c today, IM given. CM will continue to follow. Jasmine Pang RN MPH, case manager, [phone]

## 2013-10-29 NOTE — Progress Notes (Signed)
NUTRITION FOLLOW-UP  INTERVENTION: -Encourage PO intake.  NUTRITION DIAGNOSIS: Inadequate oral intake related to pacemaker infection as evidenced by reported intake less than estimated needs; improved  Goal: Pt to meet >/= 90% of their estimated nutrition needs; met  Monitor:  Weight trend, PO intake, labs  80 y.o. male  Admitting Dx: Bacteremia  ASSESSMENT: 80 year old male with history of coronary artery disease, peripheral vascular disease, COPD, history of CVA, history of congestive heart failure with EF of 30%, who presents with a fever and falls.   10/6-- Pt with pacemaker infection with enterococcal bacteremia. - Pt has had poor po ranging from 0-75%. He says that he does not have an appetite. Reports usual body weight as 160-170 lbs. Pt with moderate wasting of the temples and clavicle.  - Pt agreed to try nutritional supplements.   10/7- Pt reports his appetite has been improving. Meal completion is 100%. Pt reports he does not need any oral supplements as he is eating well. Pt was encouraged to continue eating his food at meals.  Labs: Low GFR. High BUN and creatinine.  Height: Ht Readings from Last 1 Encounters:  10/22/13 5' 9"  (1.753 m)    Weight: Wt Readings from Last 1 Encounters:  10/28/13 158 lb 6.4 oz (71.85 kg)    BMI:  Body mass index is 23.38 kg/(m^2).  Re-Estimated Nutritional Needs: Kcal: 1800-2000 Protein: 85-95 grams Fluid: 2.0 L/day  Skin: closed incisions on groin and right chest  Diet Order: Carb Control   Intake/Output Summary (Last 24 hours) at 10/29/13 0947 Last data filed at 10/29/13 0942  Gross per 24 hour  Intake    580 ml  Output   1075 ml  Net   -495 ml    Last BM: 10/3  Labs:   Recent Labs Lab 10/26/13 0505 10/27/13 0310 10/27/13 1018 10/27/13 1535 10/28/13 1255 10/29/13 0550  NA 135* 138 138  --   --  138  K 4.4 4.1 4.3  --   --  4.1  CL 103 105  --   --   --  105  CO2 23 23  --   --   --  25  BUN 32*  31*  --   --   --  30*  CREATININE 2.06* 2.04*  --  2.04*  --  2.06*  CALCIUM 8.5 8.4  --   --   --  8.5  MG  --   --   --   --  1.7  --   GLUCOSE 88 122* 115*  --   --  122*    CBG (last 3)   Recent Labs  10/28/13 1251 10/28/13 1651 10/28/13 2045  GLUCAP 90 80 234*    Scheduled Meds: . sodium chloride   Intravenous Once  . ampicillin (OMNIPEN) IV  2 g Intravenous 4 times per day  . aspirin EC  81 mg Oral Daily  . carvedilol  6.25 mg Oral BID WC  . cefTRIAXone (ROCEPHIN)  IV  2 g Intravenous Q24H  . docusate sodium  100 mg Oral BID  . ezetimibe  10 mg Oral Daily  . furosemide  40 mg Oral Daily  . heparin subcutaneous  5,000 Units Subcutaneous 3 times per day  . hydrALAZINE  10 mg Oral 3 times per day  . insulin aspart  0-15 Units Subcutaneous TID WC  . insulin aspart  0-5 Units Subcutaneous QHS  . insulin glargine  15 Units Subcutaneous QHS  . isosorbide mononitrate  30 mg Oral Daily  . linagliptin  5 mg Oral Daily  . pantoprazole  40 mg Oral Daily  . sodium chloride  3 mL Intravenous Q12H  . vitamin B-12  1,000 mcg Oral Daily    Continuous Infusions:    Past Medical History  Diagnosis Date  . Coronary artery disease     Status post CABG in 2004  . Peripheral vascular disease     Status post right femoropopliteal bypass  . Dyslipidemia   . Diabetes mellitus with peripheral artery disease   . Hypertension   . Chronic renal insufficiency     creatinine around 2.4  . CHF (congestive heart failure) 2007    ef 38%  . History of CVA (cerebrovascular accident)   . Cardiomyopathy   . COPD (chronic obstructive pulmonary disease)   . History of anemia   . Myocardial infarction   . Shortness of breath   . Pacemaker   . Pneumonia   . Anginal pain   . Dementia   . GERD (gastroesophageal reflux disease)   . Arthritis     Past Surgical History  Procedure Laterality Date  . Insert / replace / remove pacemaker  05/17/2005    [employer] dual chamber ddd mode   .  Coronary artery bypass graft  4 of 2004    4 vessel bypass  . Femoral bypass  july 2006    right with right great toe amputation  . Cardiac catheterization  04/29/2002    Dr. Glade Lloyd  . Esophagogastroduodenoscopy  07/21/2011    Procedure: ESOPHAGOGASTRODUODENOSCOPY (EGD);  Surgeon: Beryle Beams, MD;  Location: [facility];  Service: Endoscopy;  Laterality: N/A;  . Coronary angioplasty    . Femoral-popliteal bypass graft Left 05/01/2013    Procedure: LEFT FEMORAL TO ABOVE KNEE POPLITEAL ARTERY BYPASS GRAFT WITH INTRAOPERATIVE ARTEROGRAM;  Surgeon: Mal Misty, MD;  Location: [place] [facility];  Service: Vascular;  Laterality: Left;  . Amputation Left 05/05/2013    Procedure: AMPUTATION RAY- FIRST AND SECOND-LEFT FOOT;  Surgeon: Newt Minion, MD;  Location: [facility];  Service: Orthopedics;  Laterality: Left;  . Tee without cardioversion N/A 10/23/2013    Procedure: TRANSESOPHAGEAL ECHOCARDIOGRAM (TEE);  Surgeon: Dorothy Spark, MD;  Location: [facility] ENDOSCOPY;  Service: Cardiovascular;  Laterality: N/A;  . Tee without cardioversion N/A 10/28/2013    Procedure: TRANSESOPHAGEAL ECHOCARDIOGRAM (TEE);  Surgeon: Dorothy Spark, MD;  Location: [facility] ENDOSCOPY;  Service: Cardiovascular;  Laterality: N/A;    Kallie Locks, MS, RD, Provisional LDN Pager # [phone] After hours/ weekend pager # [phone]

## 2013-10-29 NOTE — Progress Notes (Addendum)
[facility] for Infectious Disease  Date of Admission:  10/19/2013  Antibiotics: Ampicillin and ceftriaxone  Subjective: No complaints, TEE yesterday  Objective: Temp:  [98.1 F (36.7 C)-99.8 F (37.7 C)] 98.7 F (37.1 C) (10/07 0940) Pulse Rate:  [46-72] 53 (10/07 0940) Resp:  [10-19] 18 (10/07 0940) BP: (118-173)/(35-73) 144/71 mmHg (10/07 0940) SpO2:  [94 %-100 %] 98 % (10/07 0940) Weight:  [158 lb 6.4 oz (71.85 kg)] 158 lb 6.4 oz (71.85 kg) (10/06 2041)  General: awake, in bed, nad Skin: no rashes Lungs: CTA B Cor: RRR Abdomen: soft, nt   Lab Results Lab Results  Component Value Date   WBC 5.6 10/29/2013   HGB 7.0* 10/29/2013   HCT 22.5* 10/29/2013   MCV 80.9 10/29/2013   PLT 253 10/29/2013    Lab Results  Component Value Date   CREATININE 2.06* 10/29/2013   BUN 30* 10/29/2013   NA 138 10/29/2013   K 4.1 10/29/2013   CL 105 10/29/2013   CO2 25 10/29/2013    Lab Results  Component Value Date   ALT 14 10/20/2013   AST 20 10/20/2013   ALKPHOS 65 10/20/2013   BILITOT 0.3 10/20/2013      Microbiology: Recent Results (from the past 240 hour(s))  CULTURE, BLOOD (ROUTINE X 2)     Status: None   Collection Time    10/23/13  7:40 AM      Result Value Ref Range Status   Specimen Description BLOOD LEFT HAND   Final   Special Requests BOTTLES DRAWN AEROBIC ONLY 3CC   Final   Culture  Setup Time     Final   Value: 10/23/2013 12:54     Performed at [facility]   Culture     Final   Value: NO GROWTH 5 DAYS     Performed at [facility]   Report Status 10/29/2013 FINAL   Final  CULTURE, BLOOD (ROUTINE X 2)     Status: None   Collection Time    10/23/13  7:45 AM      Result Value Ref Range Status   Specimen Description BLOOD LEFT HAND   Final   Special Requests BOTTLES DRAWN AEROBIC ONLY 4CC   Final   Culture  Setup Time     Final   Value: 10/23/2013 12:54     Performed at [facility]   Culture     Final   Value: NO GROWTH 5 DAYS   Performed at [facility]   Report Status 10/29/2013 FINAL   Final  SURGICAL PCR SCREEN     Status: None   Collection Time    10/27/13  3:30 AM      Result Value Ref Range Status   MRSA, PCR NEGATIVE  NEGATIVE Final   Staphylococcus aureus NEGATIVE  NEGATIVE Final   Comment:            The Xpert SA Assay (FDA     approved for NASAL specimens     in patients over 54 years of age),     is one component of     a comprehensive surveillance     program.  Test performance has     been validated by [employer] for patients greater     than or equal to 61 year old.     It is not intended     to diagnose infection nor to     guide  or monitor treatment.    Studies/Results: Dg Chest 2 View  10/28/2013   CLINICAL DATA:  Infection and inflammatory reaction due to cardiac device, implant, or graft, initial encounter, cough, type 2 diabetes mellitus with peripheral artery disease, hypertension, chronic renal insufficiency, CHF, cardiomyopathy not otherwise specified, COPD, coronary artery disease post MI, former smoker  EXAM: CHEST  2 VIEW  COMPARISON:  10/19/2013  FINDINGS: Enlargement of cardiac silhouette post CABG.  Calcified tortuous aorta.  Interval removal of RIGHT subclavian pacemaker.  Eventrations of the posterior hemidiaphragms bilaterally with bibasilar atelectasis.  Minimal central peribronchial thickening.  Question underlying emphysematous changes.  No acute infiltrate or pneumothorax.  Probable small bibasilar effusions.  Bones unremarkable.  IMPRESSION: Enlargement of cardiac silhouette post CABG and pacemaker removal.  Bibasilar atelectasis and probable small pleural effusions.  Question underlying emphysematous changes.   Electronically Signed   By: Lavonia Dana M.D.   On: 10/28/2013 09:04    Assessment/Plan: 1) pacemaker infection with Enterococcal bacteremia - on dual beta lactam therapy.  Extraction done yesterday.  Repeat blood cultures ngtd.   -will need 6 weeks of  ceftriaxone 2 grams twice a day and ampicillin every 6 hours through Nov 15th.   -antibiotics per facility protocol -should get weekly cbc, cmp -TEE did not show any retained insulation so will plan on above antibiotics -will need central line, going to SNF  We will arrange follow up in [facility] in 2-3 weeks  I will sign off, please call with Shirlee Limerick, [place] for Infectious Disease [place] [email or web address] [phone] pager   [phone] cell 10/29/2013, 11:30 AM

## 2013-10-29 NOTE — Progress Notes (Signed)
Note/chart reviewed.  Katie Pecola Haxton, RD, LDN Pager #: [phone] After-Hours Pager #: [phone]  

## 2013-10-29 NOTE — Progress Notes (Signed)
ANTIBIOTIC CONSULT NOTE - INITIAL  Pharmacy Consult for abx Indication: pacer endocarditis  Allergies  Allergen Reactions  . Lisinopril Other (See Comments)    REACTION:  Elevated potassium,renal insuf    Patient Measurements: Height: 5\' 9"  (175.3 cm) Weight: 158 lb 6.4 oz (71.85 kg) IBW/kg (Calculated) : 70.7 Adjusted Body Weight:   Vital Signs: Temp: 98.7 F (37.1 C) (10/07 0940) Temp Source: Oral (10/07 0940) BP: 144/71 mmHg (10/07 0940) Pulse Rate: 53 (10/07 0940) Intake/Output from previous day: 10/06 0701 - 10/07 0700 In: 340 [P.O.:240; I.V.:100] Out: 1075 [Urine:1075] Intake/Output from this shift: Total I/O In: 240 [P.O.:240] Out: -   Labs:  Recent Labs  10/27/13 0310 10/27/13 1018 10/27/13 1535 10/29/13 0550  WBC 4.9  --  6.7 5.6  HGB 7.2* 7.1* 7.9* 7.0*  PLT 257  --  256 253  CREATININE 2.04*  --  2.04* 2.06*   Medical History: Past Medical History  Diagnosis Date  . Coronary artery disease     Status post CABG in 2004  . Peripheral vascular disease     Status post right femoropopliteal bypass  . Dyslipidemia   . Diabetes mellitus with peripheral artery disease   . Hypertension   . Chronic renal insufficiency     creatinine around 2.4  . CHF (congestive heart failure) 2007    ef 38%  . History of CVA (cerebrovascular accident)   . Cardiomyopathy   . COPD (chronic obstructive pulmonary disease)   . History of anemia   . Myocardial infarction   . Shortness of breath   . Pacemaker   . Pneumonia   . Anginal pain   . Dementia   . GERD (gastroesophageal reflux disease)   . Arthritis     Medications:  See EMR  Assessment: 80 yo male with CKD Stage III found to have GPC in 2/2 blood cultures.  He was placed on ceftriaxone as they were growing in pairs and chains; however, the cultures resulted as enterococcus.  Plan is to use amp/rocephin due to renal issue. Will adjust dose of rocephin to for endocarditis. Plan is to treat until 11/15  per Dr. Linus Salmons. Ok to adjust dose back up to Rocephin 2g IV q12 per Dr. Linus Salmons.   Plan:   Cont Ampicillin 2g IV q6 Cont rocephin to 2g IV q12  Onnie Boer, PharmD Pager: [phone] 10/29/2013 10:16 AM

## 2013-10-30 DIAGNOSIS — R531 Weakness: Secondary | ICD-10-CM

## 2013-10-30 LAB — CBC
HCT: 22.4 % — ABNORMAL LOW (ref 39.0–52.0)
HEMOGLOBIN: 6.8 g/dL — AB (ref 13.0–17.0)
MCH: 24.1 pg — ABNORMAL LOW (ref 26.0–34.0)
MCHC: 30.4 g/dL (ref 30.0–36.0)
MCV: 79.4 fL (ref 78.0–100.0)
Platelets: 256 10*3/uL (ref 150–400)
RBC: 2.82 MIL/uL — AB (ref 4.22–5.81)
RDW: 17 % — ABNORMAL HIGH (ref 11.5–15.5)
WBC: 5.3 10*3/uL (ref 4.0–10.5)

## 2013-10-30 LAB — PREPARE RBC (CROSSMATCH)

## 2013-10-30 LAB — IRON AND TIBC
IRON: 19 ug/dL — AB (ref 42–135)
Saturation Ratios: 12 % — ABNORMAL LOW (ref 20–55)
TIBC: 158 ug/dL — AB (ref 215–435)
UIBC: 139 ug/dL (ref 125–400)

## 2013-10-30 LAB — TYPE AND SCREEN
ABO/RH(D): O POS
Antibody Screen: NEGATIVE
UNIT DIVISION: 0
Unit division: 0
Unit division: 0

## 2013-10-30 LAB — GLUCOSE, CAPILLARY
GLUCOSE-CAPILLARY: 123 mg/dL — AB (ref 70–99)
GLUCOSE-CAPILLARY: 186 mg/dL — AB (ref 70–99)
Glucose-Capillary: 88 mg/dL (ref 70–99)
Glucose-Capillary: 95 mg/dL (ref 70–99)

## 2013-10-30 LAB — HEMOGLOBIN AND HEMATOCRIT, BLOOD
HEMATOCRIT: 25.7 % — AB (ref 39.0–52.0)
Hemoglobin: 8.2 g/dL — ABNORMAL LOW (ref 13.0–17.0)

## 2013-10-30 LAB — FOLATE: Folate: 13.9 ng/mL

## 2013-10-30 LAB — FERRITIN: Ferritin: 191 ng/mL (ref 22–322)

## 2013-10-30 LAB — VITAMIN B12: VITAMIN B 12: 1223 pg/mL — AB (ref 211–911)

## 2013-10-30 MED ORDER — FUROSEMIDE 10 MG/ML IJ SOLN
20.0000 mg | Freq: Once | INTRAMUSCULAR | Status: AC
  Administered 2013-10-30: 20 mg via INTRAVENOUS

## 2013-10-30 MED ORDER — SODIUM CHLORIDE 0.9 % IV SOLN
510.0000 mg | Freq: Once | INTRAVENOUS | Status: AC
  Administered 2013-10-30: 510 mg via INTRAVENOUS
  Filled 2013-10-30: qty 17

## 2013-10-30 MED ORDER — FUROSEMIDE 10 MG/ML IJ SOLN
INTRAMUSCULAR | Status: AC
  Filled 2013-10-30: qty 4

## 2013-10-30 MED ORDER — SODIUM CHLORIDE 0.9 % IJ SOLN
10.0000 mL | INTRAMUSCULAR | Status: DC | PRN
  Administered 2013-10-30 (×2): 10 mL

## 2013-10-30 NOTE — Progress Notes (Signed)
Occupational Therapy Treatment Patient Details Name: Colin [name] MRN: [id number] DOB: 11/18/1928 Today's Date: 10/30/2013    History of present illness Colin Bruins. [name] is an 80 y.o. male admitted 10/19/13 with fever and falls at home. Pt reports low-grade fever, weakness, poor PO intake, myalgia, HA, and neck pain for 1-2 weeks on admission. PMH of CAD, PVD, hx CVA, CHF. Pt was febrile in ED.    OT comments  This 80 yo male seen today for focus on grooming and toileting. Pt making progress and will continue to benefit from continued OT services.   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 Required Braces or Orthoses: Other Brace/Splint (Pt has special shoes) Restrictions Weight Bearing Restrictions: No       Mobility Bed Mobility Overal bed mobility: Modified Independent Bed Mobility: Supine to Sit;Sit to Supine     Supine to sit: Modified independent (Device/Increase time) Sit to supine: Modified independent (Device/Increase time)      Transfers Overall transfer level: Needs assistance Equipment used: Rolling walker (2 wheeled) Transfers: Sit to/from Stand Sit to Stand: Min assist                  ADL Overall ADL's : Needs assistance/impaired     Grooming: Oral care;Min guard;Standing                   Toilet Transfer: Min guard;Ambulation;RW (Bed>sink for oral care>bed)                              Cognition   Behavior During Therapy: WFL for tasks assessed/performed Overall Cognitive Status: History of cognitive impairments - at baseline                                    Pertinent Vitals/ Pain       Pain Assessment: No/denies pain         Frequency Min 2X/week     Progress Toward  Goals  OT Goals(current goals can now be found in the care plan section)  Progress towards OT goals: Progressing toward goals     Plan Discharge plan remains appropriate       End of Session Equipment Utilized During Treatment: Rolling walker   Activity Tolerance Patient tolerated treatment well   Patient Left in bed;with call bell/phone within reach;with nursing/sitter in room           Time: 1426-1446 OT Time Calculation (min): 20 min  Charges: OT General Charges $OT Visit: 1 Procedure OT Treatments $Self Care/Home Management : 8-22 mins  Colin [name] [id number] 10/30/2013, 3:08 PM

## 2013-10-30 NOTE — Progress Notes (Signed)
CRITICAL VALUE ALERT  Critical value received:  HGb = 6.8   Date of notification:  10/30/13  Time of notification:  0705  Critical value read back:Yes.    Nurse who received alert:  K.Laurance Flatten  MD notified (1st page):  Dr. Conley Canal  Time of first page:  [phone]

## 2013-10-30 NOTE — Progress Notes (Signed)
Discussed with Dr. Lovena Le  PROGRESS NOTE  Colin [name] [id number] DOB: Nov 06, 1928 DOA: 10/19/2013 PCP: Horatio Pel, MD  80 year old male with history of coronary artery disease, peripheral vascular disease, COPD, history of CVA, history of congestive heart failure with EF of 30%, who presents with a fever and falls. Apparently the patient has had intermittent low-grade fever, weakness, poor oral intake, myalgia, headache and neck pain Found to have infection on his pacer wire.  Pacemaker was removed 10/5 but small portion remained.  Patient had TEE again on 10/6.  If retained insulation is present, he will either need lifelong suppressive anti-biotics or be referred for extraction of his RV lead tip   Assessment/Plan:  Enterococcus Bacteremia/Pacemaker lead endocarditis:  TEE:  4 x 25 mm vegetation attached to the right atrial portion of the RV pacer lead. S/p extraction. Repeat TEE shows no remnant insulation or vegetation. -ID consult- needs 6 weeks of IV abx: weeks of ceftriaxone 2 grams once a day and ampicillin every 6 hours through Nov 15th [facility] line in.  Anemia secondary to CKD, iron deficiency, blood loss- hgb down to 6.8. Transfuse 1 unit pRBC. Iron 19. IV iron today. Then PO at discharge    CORONARY ARTERY DISEASE    Chronic systolic CHF (congestive heart failure): compensated    Peripheral vascular disease, unspecified   acute renal failue with CKD (chronic kidney disease), stage III. BUN down. Creat down. Saline lock    Falls at home, multiple. SNF tomorrow if hgb stable    Type 2 diabetes, better controlled  protien calorie malnutrition  Code Status:  full Family Communication:   Disposition Plan:  SNF  Consultants:  EP  Cards  ID  IR  Procedures:   TEE x2  Pacemaker extraction  tunnelled left IJ CVC  HPI/Subjective: No complaints. No bleeding  Objective: Filed Vitals:   10/30/13 1504  BP: 150/43  Pulse: 55  Temp: 98.2 F  (36.8 C)  Resp: 17    Intake/Output Summary (Last 24 hours) at 10/30/13 1559 Last data filed at 10/30/13 1300  Gross per 24 hour  Intake    720 ml  Output   1900 ml  Net  -1180 ml   Filed Weights   10/26/13 2039 10/27/13 2057 10/28/13 2041  Weight: 71.85 kg (158 lb 6.4 oz) 71.85 kg (158 lb 6.4 oz) 71.85 kg (158 lb 6.4 oz)    Exam:   General:  Frail, elderly. comfortable  Cardiovascular: RRR without MGR. Dressings CDI  Respiratory: CTA without WRR  Abdomen: s, nt, nd  Ext: no CCE  Basic Metabolic Panel:  Recent Labs Lab 10/26/13 0505 10/27/13 0310 10/27/13 1018 10/27/13 1535 10/28/13 1255 10/29/13 0550  NA 135* 138 138  --   --  138  K 4.4 4.1 4.3  --   --  4.1  CL 103 105  --   --   --  105  CO2 23 23  --   --   --  25  GLUCOSE 88 122* 115*  --   --  122*  BUN 32* 31*  --   --   --  30*  CREATININE 2.06* 2.04*  --  2.04*  --  2.06*  CALCIUM 8.5 8.4  --   --   --  8.5  MG  --   --   --   --  1.7  --    Liver Function Tests: No results found for this basename: AST, ALT, ALKPHOS, BILITOT, PROT,  ALBUMIN,  in the last 168 hours No results found for this basename: LIPASE, AMYLASE,  in the last 168 hours No results found for this basename: AMMONIA,  in the last 168 hours CBC:  Recent Labs Lab 10/26/13 0505 10/27/13 0310 10/27/13 1018 10/27/13 1535 10/29/13 0550 10/30/13 0605  WBC 5.7 4.9  --  6.7 5.6 5.3  NEUTROABS 3.4  --   --   --   --   --   HGB 6.8* 7.2* 7.1* 7.9* 7.0* 6.8*  HCT 21.9* 22.5* 21.0* 24.9* 22.5* 22.4*  MCV 80.2 77.3*  --  76.9* 80.9 79.4  PLT 251 257  --  256 253 256   Cardiac Enzymes: No results found for this basename: CKTOTAL, CKMB, CKMBINDEX, TROPONINI,  in the last 168 hours BNP (last 3 results)  Recent Labs  10/19/13 1200  PROBNP 2214.0*   CBG:  Recent Labs Lab 10/29/13 1137 10/29/13 1624 10/29/13 2043 10/30/13 0750 10/30/13 1202  GLUCAP 143* 173* 118* 88 123*    Recent Results (from the past 240 hour(s))   CULTURE, BLOOD (ROUTINE X 2)     Status: None   Collection Time    10/23/13  7:40 AM      Result Value Ref Range Status   Specimen Description BLOOD LEFT HAND   Final   Special Requests BOTTLES DRAWN AEROBIC ONLY 3CC   Final   Culture  Setup Time     Final   Value: 10/23/2013 12:54     Performed at [facility]   Culture     Final   Value: NO GROWTH 5 DAYS     Performed at [facility]   Report Status 10/29/2013 FINAL   Final  CULTURE, BLOOD (ROUTINE X 2)     Status: None   Collection Time    10/23/13  7:45 AM      Result Value Ref Range Status   Specimen Description BLOOD LEFT HAND   Final   Special Requests BOTTLES DRAWN AEROBIC ONLY 4CC   Final   Culture  Setup Time     Final   Value: 10/23/2013 12:54     Performed at [facility]   Culture     Final   Value: NO GROWTH 5 DAYS     Performed at [facility]   Report Status 10/29/2013 FINAL   Final  SURGICAL PCR SCREEN     Status: None   Collection Time    10/27/13  3:30 AM      Result Value Ref Range Status   MRSA, PCR NEGATIVE  NEGATIVE Final   Staphylococcus aureus NEGATIVE  NEGATIVE Final   Comment:            The Xpert SA Assay (FDA     approved for NASAL specimens     in patients over 23 years of age),     is one component of     a comprehensive surveillance     program.  Test performance has     been validated by [employer] for patients greater     than or equal to 49 year old.     It is not intended     to diagnose infection nor to     guide or monitor treatment.     Studies: Ir Fluoro Guide Cv Line Left  10/29/2013   CLINICAL DATA:  Tunneled central venous catheter requested for IV antibiotic therapy to treat pacemaker  infection. Tunneled jugular catheter placement was requested due to potential need to use the upper extremities for future dialysis access given history of renal failure.  EXAM: TUNNELED CENTRAL VENOUS CATHETER PLACEMENT WITH ULTRASOUND AND FLUOROSCOPIC  GUIDANCE  ANESTHESIA/SEDATION: No sedation was administered.  MEDICATIONS: None  FLUOROSCOPY TIME:  36 seconds.  PROCEDURE: The procedure, risks, benefits, and alternatives were explained to the patient. Questions regarding the procedure were encouraged and answered. The patient understands and consents to the procedure.  The left neck and chest were prepped with chlorhexidine in a sterile fashion, and a sterile drape was applied covering the operative field. Maximum barrier sterile technique with sterile gowns and gloves were used for the procedure. Prior to the procedure, a time-out procedure was performed. Local anesthesia was provided with 1% lidocaine.  Ultrasound was used to confirm patency of the left internal jugular vein. After creating a small venotomy incision, a 21 gauge needle was advanced into the left internal jugular vein under direct, real-time ultrasound guidance. Ultrasound image documentation was performed. After securing guidewire access, a 6 French peel-away sheath was placed. A wire was kinked to measure appropriate catheter length.  A 6 French, dual-lumen tunneled power injectable central venous catheter measuring 27 cm from tip to cuff was chosen for placement. This was tunneled in a retrograde fashion from the chest wall to the venotomy incision.  The catheter was then placed through the sheath and the sheath removed. Final catheter positioning was confirmed and documented with a fluoroscopic spot image. Both lumens were aspirated and flushed with saline.  The venotomy incision was closed with subcutaneous 3-0 Monocryl and subcuticular 4-0 Vicryl. Dermabond was applied to the incision. The catheter exit site was secured with 0-Prolene retention sutures.  COMPLICATIONS: None.  No pneumothorax.  FINDINGS: After catheter placement, the tip lies at the cavoatrial junction. The catheter aspirates normally and is ready for immediate use.  IMPRESSION: Placement of tunneled central venous catheter  via the left internal jugular vein. The catheter tip lies at the cavoatrial junction. The catheter is ready for immediate use.   Electronically Signed   By: Aletta Edouard M.D.   On: 10/29/2013 19:14   Ir US Guide Vasc Access Left  10/29/2013   CLINICAL DATA:  Tunneled central venous catheter requested for IV antibiotic therapy to treat pacemaker infection. Tunneled jugular catheter placement was requested due to potential need to use the upper extremities for future dialysis access given history of renal failure.  EXAM: TUNNELED CENTRAL VENOUS CATHETER PLACEMENT WITH ULTRASOUND AND FLUOROSCOPIC GUIDANCE  ANESTHESIA/SEDATION: No sedation was administered.  MEDICATIONS: None  FLUOROSCOPY TIME:  36 seconds.  PROCEDURE: The procedure, risks, benefits, and alternatives were explained to the patient. Questions regarding the procedure were encouraged and answered. The patient understands and consents to the procedure.  The left neck and chest were prepped with chlorhexidine in a sterile fashion, and a sterile drape was applied covering the operative field. Maximum barrier sterile technique with sterile gowns and gloves were used for the procedure. Prior to the procedure, a time-out procedure was performed. Local anesthesia was provided with 1% lidocaine.  Ultrasound was used to confirm patency of the left internal jugular vein. After creating a small venotomy incision, a 21 gauge needle was advanced into the left internal jugular vein under direct, real-time ultrasound guidance. Ultrasound image documentation was performed. After securing guidewire access, a 6 French peel-away sheath was placed. A wire was kinked to measure appropriate catheter length.  A 6 [place], dual-lumen tunneled  power injectable central venous catheter measuring 27 cm from tip to cuff was chosen for placement. This was tunneled in a retrograde fashion from the chest wall to the venotomy incision.  The catheter was then placed through the sheath  and the sheath removed. Final catheter positioning was confirmed and documented with a fluoroscopic spot image. Both lumens were aspirated and flushed with saline.  The venotomy incision was closed with subcutaneous 3-0 Monocryl and subcuticular 4-0 Vicryl. Dermabond was applied to the incision. The catheter exit site was secured with 0-Prolene retention sutures.  COMPLICATIONS: None.  No pneumothorax.  FINDINGS: After catheter placement, the tip lies at the cavoatrial junction. The catheter aspirates normally and is ready for immediate use.  IMPRESSION: Placement of tunneled central venous catheter via the left internal jugular vein. The catheter tip lies at the cavoatrial junction. The catheter is ready for immediate use.   Electronically Signed   By: Aletta Edouard M.D.   On: 10/29/2013 19:14   Echo Left ventricle: The cavity size was normal. Wall thickness was increased in a pattern of mild LVH. Systolic function was moderately to severely reduced. The estimated ejection fraction was in the range of 30% to 35%. Diffuse hypokinesis. Doppler parameters are consistent with abnormal left ventricular relaxation (grade 1 diastolic dysfunction). - Ventricular septum: Septal motion showed abnormal function and dyssynergy. - Aortic valve: Trileaflet; moderately calcified leaflets. There was trivial regurgitation. - Mitral valve: There was trivial regurgitation. - Left atrium: The atrium was moderately dilated. - Right ventricle: Pacer wire or catheter noted in right ventricle. Systolic function was reduced. - Right atrium: The atrium was mildly dilated. Pacer wire or catheter noted in right atrium. Central venous pressure (est): 15 mm Hg. - Atrial septum: No defect or patent foramen ovale was identified. - Tricuspid valve: Mildly thickened leaflets. There was mild regurgitation. - Pulmonary arteries: PA peak pressure: 39 mm Hg (S). - Pericardium, extracardiac: There was no pericardial  effusion.  Impressions:  - Mild LVH with LVEF approximately 30-35%, septal dyssynergy, grade 1 diastolic dysfunction. Sclerotic aortic valve with trivial aortic regurgitation. Device wire noted in right heart. Reduced RV contraction. Thickened tricuspid valve with mild tricuspid regurgitation and PASP 39 mmHg. No obvious vegetations noted - shadowing in region of tricuspid valve likely secondary to device wire. If high suspicion of endocarditis, TEE could be considered.   Scheduled Meds: . sodium chloride   Intravenous Once  . ampicillin (OMNIPEN) IV  2 g Intravenous 4 times per day  . aspirin EC  81 mg Oral Daily  . carvedilol  6.25 mg Oral BID WC  . cefTRIAXone (ROCEPHIN)  IV  2 g Intravenous Q12H  . docusate sodium  100 mg Oral BID  . ezetimibe  10 mg Oral Daily  . ferumoxytol  510 mg Intravenous Once  . furosemide  20 mg Intravenous Once  . furosemide  40 mg Oral Daily  . heparin subcutaneous  5,000 Units Subcutaneous 3 times per day  . hydrALAZINE  10 mg Oral 3 times per day  . insulin aspart  0-15 Units Subcutaneous TID WC  . insulin aspart  0-5 Units Subcutaneous QHS  . insulin glargine  15 Units Subcutaneous QHS  . isosorbide mononitrate  30 mg Oral Daily  . linagliptin  5 mg Oral Daily  . pantoprazole  40 mg Oral Daily  . sodium chloride  3 mL Intravenous Q12H  . vitamin B-12  1,000 mcg Oral Daily   Continuous Infusions:    Time  spent: 25 minutes  [facility] Pager [phone]. If 7PM-7AM, please contact night-coverage at [email or web address], password [facility] 10/30/2013, 3:59 PM  LOS: 11 days

## 2013-10-30 NOTE — Progress Notes (Signed)
Patient ID: Leary Roca, male   DOB: 05-05-1928, 80 y.o.   MRN: [id number]   Patient Name: Colin [name] Date of Encounter: 10/30/2013     Principal Problem:   Bacteremia Active Problems:   CORONARY ARTERY DISEASE   Cardiac pacemaker in situ   Chronic systolic CHF (congestive heart failure)   Peripheral vascular disease, unspecified   CKD (chronic kidney disease), stage III   Fall   Type 2 diabetes, uncontrolled, with renal manifestation   Weakness generalized   Anemia    SUBJECTIVE  No pain at incision site. No chest pain or sob.  CURRENT MEDS . sodium chloride   Intravenous Once  . ampicillin (OMNIPEN) IV  2 g Intravenous 4 times per day  . aspirin EC  81 mg Oral Daily  . carvedilol  6.25 mg Oral BID WC  . cefTRIAXone (ROCEPHIN)  IV  2 g Intravenous Q12H  . docusate sodium  100 mg Oral BID  . ezetimibe  10 mg Oral Daily  . furosemide  40 mg Oral Daily  . heparin subcutaneous  5,000 Units Subcutaneous 3 times per day  . hydrALAZINE  10 mg Oral 3 times per day  . insulin aspart  0-15 Units Subcutaneous TID WC  . insulin aspart  0-5 Units Subcutaneous QHS  . insulin glargine  15 Units Subcutaneous QHS  . isosorbide mononitrate  30 mg Oral Daily  . linagliptin  5 mg Oral Daily  . pantoprazole  40 mg Oral Daily  . sodium chloride  3 mL Intravenous Q12H  . vitamin B-12  1,000 mcg Oral Daily    OBJECTIVE  Filed Vitals:   10/29/13 1759 10/29/13 1801 10/29/13 2050 10/30/13 0604  BP: 165/50 142/80 113/41 137/44  Pulse: 62 84 55 54  Temp:  98.5 F (36.9 C) 98.6 F (37 C) 98.5 F (36.9 C)  TempSrc:  Oral Oral Oral  Resp:  18 16 17   Height:      Weight:      SpO2:  98% 98% 97%    Intake/Output Summary (Last 24 hours) at 10/30/13 0748 Last data filed at 10/30/13 0604  Gross per 24 hour  Intake    600 ml  Output   1550 ml  Net   -950 ml   Filed Weights   10/26/13 2039 10/27/13 2057 10/28/13 2041  Weight: 158 lb 6.4 oz (71.85 kg) 158 lb 6.4 oz (71.85 kg)  158 lb 6.4 oz (71.85 kg)    PHYSICAL EXAM  General: Pleasant, NAD. Neuro: Alert and oriented X 3. Moves all extremities spontaneously. Psych: Normal affect. HEENT:  Normal  Neck: Supple without bruits or JVD. Lungs:  Resp regular and unlabored, CTA. Heart: RRR no s3, s4, or murmurs. Abdomen: Soft, non-tender, non-distended, BS + x 4.  Extremities: No clubbing, cyanosis or edema. DP/PT/Radials 2+ and equal bilaterally.  Accessory Clinical Findings  CBC  Recent Labs  10/29/13 0550 10/30/13 0605  WBC 5.6 5.3  HGB 7.0* 6.8*  HCT 22.5* 22.4*  MCV 80.9 79.4  PLT 253 [id number]   Basic Metabolic Panel  Recent Labs  10/27/13 1018 10/27/13 1535 10/28/13 1255 10/29/13 0550  NA 138  --   --  138  K 4.3  --   --  4.1  CL  --   --   --  105  CO2  --   --   --  25  GLUCOSE 115*  --   --  122*  BUN  --   --   --  30*  CREATININE  --  2.04*  --  2.06*  CALCIUM  --   --   --  8.5  MG  --   --  1.7  --    Liver Function Tests No results found for this basename: AST, ALT, ALKPHOS, BILITOT, PROT, ALBUMIN,  in the last 72 hours No results found for this basename: LIPASE, AMYLASE,  in the last 72 hours Cardiac Enzymes No results found for this basename: CKTOTAL, CKMB, CKMBINDEX, TROPONINI,  in the last 72 hours BNP No components found with this basename: POCBNP,  D-Dimer No results found for this basename: DDIMER,  in the last 72 hours Hemoglobin A1C No results found for this basename: HGBA1C,  in the last 72 hours Fasting Lipid Panel No results found for this basename: CHOL, HDL, LDLCALC, TRIG, CHOLHDL, LDLDIRECT,  in the last 72 hours Thyroid Function Tests No results found for this basename: TSH, T4TOTAL, FREET3, T3FREE, THYROIDAB,  in the last 72 hours  TELE nsr   Radiology/Studies  Dg Chest 2 View  10/28/2013   CLINICAL DATA:  Infection and inflammatory reaction due to cardiac device, implant, or graft, initial encounter, cough, type 2 diabetes mellitus with peripheral  artery disease, hypertension, chronic renal insufficiency, CHF, cardiomyopathy not otherwise specified, COPD, coronary artery disease post MI, former smoker  EXAM: CHEST  2 VIEW  COMPARISON:  10/19/2013  FINDINGS: Enlargement of cardiac silhouette post CABG.  Calcified tortuous aorta.  Interval removal of RIGHT subclavian pacemaker.  Eventrations of the posterior hemidiaphragms bilaterally with bibasilar atelectasis.  Minimal central peribronchial thickening.  Question underlying emphysematous changes.  No acute infiltrate or pneumothorax.  Probable small bibasilar effusions.  Bones unremarkable.  IMPRESSION: Enlargement of cardiac silhouette post CABG and pacemaker removal.  Bibasilar atelectasis and probable small pleural effusions.  Question underlying emphysematous changes.   Electronically Signed   By: Lavonia Dana M.D.   On: 10/28/2013 09:04   Dg Chest 2 View  10/19/2013   CLINICAL DATA:  Fever, confusion and fall.  EXAM: CHEST  2 VIEW  COMPARISON:  04/30/2013  FINDINGS: Stable appearance of the right dual lead cardiac pacemaker. Heart and mediastinum are stable. Post CABG changes in the chest. There is mild elevation of the posterior hemidiaphragms bilaterally and this is similar to the prior examination. No evidence for airspace disease or pleural effusions.  IMPRESSION: No acute cardiopulmonary disease.   Electronically Signed   By: Markus Daft M.D.   On: 10/19/2013 07:59   Ct Head Wo Contrast  10/19/2013   CLINICAL DATA:  Fall and complains of posterior neck pain.  EXAM: CT HEAD WITHOUT CONTRAST  CT CERVICAL SPINE WITHOUT CONTRAST  TECHNIQUE: Multidetector CT imaging of the head and cervical spine was performed following the standard protocol without intravenous contrast. Multiplanar CT image reconstructions of the cervical spine were also generated.  COMPARISON:  Head CT 07/01/2012  FINDINGS: CT HEAD FINDINGS  There is cerebral atrophy which has minimally changed. There is extensive low-density in the  periventricular and subcortical white matter suggesting chronic changes. No evidence for acute hemorrhage, mass lesion, midline shift, hydrocephalus or large infarct. Again noted is an mixed sclerotic and lucent process involving the greater wing of the left sphenoid. This appears to obliterate the left sphenoid sinus and this has not significantly changed. Findings suggest a chronic benign process such as Paget's disease.  CT CERVICAL SPINE FINDINGS  Lung apices are clear. Negative for a fracture or dislocation. Multilevel degenerative disease in the cervical  spine with disc space narrowing from C3-C7. Normal alignment at the cervical-thoracic junction. Multiple levels of the uncovertebral spurring.  IMPRESSION: No acute intracranial abnormality.  No acute bone abnormality in the cervical spine.  Atrophy and evidence for chronic small vessel ischemic changes.  Multilevel degenerative disease in the cervical spine.   Electronically Signed   By: Markus Daft M.D.   On: 10/19/2013 08:12   Ct Cervical Spine Wo Contrast  10/19/2013   CLINICAL DATA:  Fall and complains of posterior neck pain.  EXAM: CT HEAD WITHOUT CONTRAST  CT CERVICAL SPINE WITHOUT CONTRAST  TECHNIQUE: Multidetector CT imaging of the head and cervical spine was performed following the standard protocol without intravenous contrast. Multiplanar CT image reconstructions of the cervical spine were also generated.  COMPARISON:  Head CT 07/01/2012  FINDINGS: CT HEAD FINDINGS  There is cerebral atrophy which has minimally changed. There is extensive low-density in the periventricular and subcortical white matter suggesting chronic changes. No evidence for acute hemorrhage, mass lesion, midline shift, hydrocephalus or large infarct. Again noted is an mixed sclerotic and lucent process involving the greater wing of the left sphenoid. This appears to obliterate the left sphenoid sinus and this has not significantly changed. Findings suggest a chronic benign  process such as Paget's disease.  CT CERVICAL SPINE FINDINGS  Lung apices are clear. Negative for a fracture or dislocation. Multilevel degenerative disease in the cervical spine with disc space narrowing from C3-C7. Normal alignment at the cervical-thoracic junction. Multiple levels of the uncovertebral spurring.  IMPRESSION: No acute intracranial abnormality.  No acute bone abnormality in the cervical spine.  Atrophy and evidence for chronic small vessel ischemic changes.  Multilevel degenerative disease in the cervical spine.   Electronically Signed   By: Markus Daft M.D.   On: 10/19/2013 08:12   Ir Fluoro Guide Cv Line Left  10/29/2013   CLINICAL DATA:  Tunneled central venous catheter requested for IV antibiotic therapy to treat pacemaker infection. Tunneled jugular catheter placement was requested due to potential need to use the upper extremities for future dialysis access given history of renal failure.  EXAM: TUNNELED CENTRAL VENOUS CATHETER PLACEMENT WITH ULTRASOUND AND FLUOROSCOPIC GUIDANCE  ANESTHESIA/SEDATION: No sedation was administered.  MEDICATIONS: None  FLUOROSCOPY TIME:  36 seconds.  PROCEDURE: The procedure, risks, benefits, and alternatives were explained to the patient. Questions regarding the procedure were encouraged and answered. The patient understands and consents to the procedure.  The left neck and chest were prepped with chlorhexidine in a sterile fashion, and a sterile drape was applied covering the operative field. Maximum barrier sterile technique with sterile gowns and gloves were used for the procedure. Prior to the procedure, a time-out procedure was performed. Local anesthesia was provided with 1% lidocaine.  Ultrasound was used to confirm patency of the left internal jugular vein. After creating a small venotomy incision, a 21 gauge needle was advanced into the left internal jugular vein under direct, real-time ultrasound guidance. Ultrasound image documentation was  performed. After securing guidewire access, a 6 French peel-away sheath was placed. A wire was kinked to measure appropriate catheter length.  A 6 French, dual-lumen tunneled power injectable central venous catheter measuring 27 cm from tip to cuff was chosen for placement. This was tunneled in a retrograde fashion from the chest wall to the venotomy incision.  The catheter was then placed through the sheath and the sheath removed. Final catheter positioning was confirmed and documented with a fluoroscopic spot image. Both lumens were  aspirated and flushed with saline.  The venotomy incision was closed with subcutaneous 3-0 Monocryl and subcuticular 4-0 Vicryl. Dermabond was applied to the incision. The catheter exit site was secured with 0-Prolene retention sutures.  COMPLICATIONS: None.  No pneumothorax.  FINDINGS: After catheter placement, the tip lies at the cavoatrial junction. The catheter aspirates normally and is ready for immediate use.  IMPRESSION: Placement of tunneled central venous catheter via the left internal jugular vein. The catheter tip lies at the cavoatrial junction. The catheter is ready for immediate use.   Electronically Signed   By: Aletta Edouard M.D.   On: 10/29/2013 19:14   Ir US Guide Vasc Access Left  10/29/2013   CLINICAL DATA:  Tunneled central venous catheter requested for IV antibiotic therapy to treat pacemaker infection. Tunneled jugular catheter placement was requested due to potential need to use the upper extremities for future dialysis access given history of renal failure.  EXAM: TUNNELED CENTRAL VENOUS CATHETER PLACEMENT WITH ULTRASOUND AND FLUOROSCOPIC GUIDANCE  ANESTHESIA/SEDATION: No sedation was administered.  MEDICATIONS: None  FLUOROSCOPY TIME:  36 seconds.  PROCEDURE: The procedure, risks, benefits, and alternatives were explained to the patient. Questions regarding the procedure were encouraged and answered. The patient understands and consents to the procedure.   The left neck and chest were prepped with chlorhexidine in a sterile fashion, and a sterile drape was applied covering the operative field. Maximum barrier sterile technique with sterile gowns and gloves were used for the procedure. Prior to the procedure, a time-out procedure was performed. Local anesthesia was provided with 1% lidocaine.  Ultrasound was used to confirm patency of the left internal jugular vein. After creating a small venotomy incision, a 21 gauge needle was advanced into the left internal jugular vein under direct, real-time ultrasound guidance. Ultrasound image documentation was performed. After securing guidewire access, a 6 French peel-away sheath was placed. A wire was kinked to measure appropriate catheter length.  A 6 French, dual-lumen tunneled power injectable central venous catheter measuring 27 cm from tip to cuff was chosen for placement. This was tunneled in a retrograde fashion from the chest wall to the venotomy incision.  The catheter was then placed through the sheath and the sheath removed. Final catheter positioning was confirmed and documented with a fluoroscopic spot image. Both lumens were aspirated and flushed with saline.  The venotomy incision was closed with subcutaneous 3-0 Monocryl and subcuticular 4-0 Vicryl. Dermabond was applied to the incision. The catheter exit site was secured with 0-Prolene retention sutures.  COMPLICATIONS: None.  No pneumothorax.  FINDINGS: After catheter placement, the tip lies at the cavoatrial junction. The catheter aspirates normally and is ready for immediate use.  IMPRESSION: Placement of tunneled central venous catheter via the left internal jugular vein. The catheter tip lies at the cavoatrial junction. The catheter is ready for immediate use.   Electronically Signed   By: Aletta Edouard M.D.   On: 10/29/2013 19:14    ASSESSMENT AND PLAN 1.Pacemaker lead infection 2. S/p PM system extraction with a 3 cm of embedded lead still  attached to the RV with no evidence of retained insulation or other lead fragments 3. Enterococcal SBE 4. Anemia Rec: from my perspective ok for discharge. We will arrange followup as an outpatient to have stitches removed. I will see back in a month or so. Hopefully no symptomatic bradycardia in the interim.  Elina Streng,M.D.  Biannca Scantlin,M.D.  10/30/2013 7:48 AM

## 2013-10-31 ENCOUNTER — Encounter (HOSPITAL_COMMUNITY): Payer: Self-pay | Admitting: Internal Medicine

## 2013-10-31 DIAGNOSIS — W19XXXA Unspecified fall, initial encounter: Secondary | ICD-10-CM

## 2013-10-31 LAB — BASIC METABOLIC PANEL
Anion gap: 13 (ref 5–15)
BUN: 30 mg/dL — ABNORMAL HIGH (ref 6–23)
CHLORIDE: 102 meq/L (ref 96–112)
CO2: 25 mEq/L (ref 19–32)
Calcium: 8.8 mg/dL (ref 8.4–10.5)
Creatinine, Ser: 2.16 mg/dL — ABNORMAL HIGH (ref 0.50–1.35)
GFR calc non Af Amer: 27 mL/min — ABNORMAL LOW (ref >90)
GFR, EST AFRICAN AMERICAN: 31 mL/min — AB (ref >90)
Glucose, Bld: 67 mg/dL — ABNORMAL LOW (ref 70–99)
POTASSIUM: 4.1 meq/L (ref 3.7–5.3)
Sodium: 140 mEq/L (ref 137–147)

## 2013-10-31 LAB — GLUCOSE, CAPILLARY
GLUCOSE-CAPILLARY: 62 mg/dL — AB (ref 70–99)
GLUCOSE-CAPILLARY: 120 mg/dL — AB (ref 70–99)
Glucose-Capillary: 88 mg/dL (ref 70–99)
Glucose-Capillary: 183 mg/dL — ABNORMAL HIGH (ref 70–99)

## 2013-10-31 LAB — TYPE AND SCREEN
ABO/RH(D): O POS
Antibody Screen: NEGATIVE
Unit division: 0

## 2013-10-31 MED ORDER — ONDANSETRON HCL 4 MG PO TABS: 4.0000 mg | ORAL_TABLET | Freq: Four times a day (QID) | ORAL | Status: DC | PRN

## 2013-10-31 MED ORDER — INSULIN GLARGINE 100 UNIT/ML ~~LOC~~ SOLN: 10.0000 [IU] | Freq: Every day | SUBCUTANEOUS | Status: DC

## 2013-10-31 MED ORDER — SODIUM CHLORIDE 0.9 % IV SOLN: 2.0000 g | Freq: Four times a day (QID) | INTRAVENOUS | Status: DC

## 2013-10-31 MED ORDER — DSS 100 MG PO CAPS: 100.0000 mg | ORAL_CAPSULE | Freq: Two times a day (BID) | ORAL | Status: DC

## 2013-10-31 MED ORDER — DEXTROSE 5 % IV SOLN: 2.0000 g | Freq: Two times a day (BID) | INTRAVENOUS | Status: DC

## 2013-10-31 MED ORDER — INSULIN ASPART 100 UNIT/ML ~~LOC~~ SOLN: 0.0000 [IU] | Freq: Three times a day (TID) | SUBCUTANEOUS | Status: DC

## 2013-10-31 MED ORDER — FERROUS SULFATE 325 (65 FE) MG PO TABS: 325.0000 mg | ORAL_TABLET | Freq: Every day | ORAL | Status: AC

## 2013-10-31 MED ORDER — HYDRALAZINE HCL 10 MG PO TABS: 10.0000 mg | ORAL_TABLET | Freq: Three times a day (TID) | ORAL | Status: DC

## 2013-10-31 MED ORDER — ACETAMINOPHEN 325 MG PO TABS: 650.0000 mg | ORAL_TABLET | Freq: Four times a day (QID) | ORAL | Status: DC | PRN

## 2013-10-31 NOTE — Progress Notes (Signed)
Physical Therapy Treatment Patient Details Name: Colin [name] MRN: [id number] DOB: 1928-10-26 Today's Date: Nov 17, 2013    History of Present Illness Colin [name] is an 80 y.o. male admitted 10/19/13 with fever and falls at home. Pt reports low-grade fever, weakness, poor PO intake, myalgia, HA, and neck pain for 1-2 weeks on admission. PMH of CAD, PVD, hx CVA, CHF. Pt was febrile in ED.     PT Comments    Patient demonstrates fatigue/dizziness/wooziness with head turns and gait.  He declined further activity following ambulation.  Did encourage him to work hard due to rehab focus on mobility, safety and strengthening and that one walk a day isn't enough to progress.  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 Other Brace/Splint: Used pt's prosthetic shoes today    Mobility  Bed Mobility Overal bed mobility: Modified Independent       Supine to sit: Modified independent (Device/Increase time)        Transfers   Equipment used: Rolling walker (2 wheeled)   Sit to Stand: Min guard         General transfer comment: Assist for balance; cues for hand placement to sit due to sat with hands on walker  Ambulation/Gait Ambulation/Gait assistance: Min guard;Supervision Ambulation Distance (Feet): 120 Feet Assistive device: Rolling walker (2 wheeled) Gait Pattern/deviations: Shuffle;Decreased stride length;Trunk flexed     General Gait Details: self limited with distance   Stairs            Wheelchair Mobility    Modified Rankin (Stroke Patients Only)       Balance Overall balance assessment: Needs assistance         Standing balance support: Bilateral upper extremity supported Standing  balance-Leahy Scale: Poor Standing balance comment: UE support for ambulation                    Cognition Arousal/Alertness: Awake/alert Behavior During Therapy: WFL for tasks assessed/performed Overall Cognitive Status: History of cognitive impairments - at baseline                      Exercises      General Comments        Pertinent Vitals/Pain Pain Assessment: No/denies pain    Home Living                      Prior Function            PT Goals (current goals can now be found in the care plan section) Progress towards PT goals: Progressing toward goals    Frequency  Min 3X/week    PT Plan Current plan remains appropriate    Co-evaluation             End of Session Equipment Utilized During Treatment: Gait belt Activity Tolerance: Patient limited by fatigue Patient left: in chair;with call bell/phone within reach;with chair alarm set     Time: 1139-1159 PT Time Calculation (min): 20 min  Charges:  $Gait Training: 8-22 mins                    G Codes:      WYNN,CYNDI Nov 17, 2013, 1:50 PM Magda Kiel, [employer] 2013-11-17

## 2013-10-31 NOTE — Progress Notes (Signed)
Report called to charge nurse at [facility].

## 2013-10-31 NOTE — Progress Notes (Signed)
Patient Discharge:  Disposition: Pt discharged to SNF [facility])  Education: [facility] sent to SNF. Report called to charge nurse.   Telemetry: Removed  Transportation: Pt transported via ambulance  Belongings: All belongings taken with pt

## 2013-10-31 NOTE — Discharge Summary (Signed)
Physician Discharge Summary  Colin [name] [id number] DOB: 01/18/1929 DOA: 10/19/2013  PCP: Horatio Pel, MD  Admit date: 10/19/2013 Discharge date: 10/31/2013  Time spent: greater than 30 minutes  Recommendations for Outpatient Follow-up:  1. Dr. Cristopher Peru to arrange follow up for suture removal and hospital follow up. 2. Monitor H/H periodically 3. Arrange removal of tunneled central line with interventional radiology after completion of IV antibiotics 4. Monitor CBG qac. 5. 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 Active Problems:   CORONARY ARTERY DISEASE   Chronic systolic CHF (congestive heart failure), compensated   Peripheral vascular disease, unspecified   CKD (chronic kidney disease), stage III   Fall   Type 2 diabetes, uncontrolled, with renal manifestation   Weakness generalized   Anemia, multifactorial    Discharge Condition: stable  Filed Weights   10/27/13 2057 10/28/13 2041 10/30/13 2114  Weight: 71.85 kg (158 lb 6.4 oz) 71.85 kg (158 lb 6.4 oz) 77.1 kg (169 lb 15.6 oz)    History of present illness/Hospital Course:   80 year old male with history of coronary artery disease, peripheral vascular disease, COPD, history of CVA, history of congestive heart failure with EF of 30%, who presents with a fever and falls. Apparently the patient has had intermittent low-grade fever, weakness, poor oral intake, myalgia, headache and neck pain for some time. Blood cultures positive for enterococcus. TEE showed infection on his pacer wire.  Enterococcus Bacteremia/Pacemaker lead endocarditis:  ID, cardiology and EP consulted. TEE: 4 x 25 mm vegetation attached to the right atrial portion of the RV pacer lead. S/p extraction except small remnant. Repeat TEE shows no remnant insulation or vegetation.  -ID consult- needs 6 weeks of IV abx: weeks of ceftriaxone 2 grams once a day and  ampicillin every 6 hours through Nov 15th  Due to CKD, nephrology recommended tunneled central line instead of PICC in case CKD were to progress and patient were to require dialysis access in the future. Please arrange IR to remove CVL after completion of antibiotics  Anemia secondary to CKD, acute illness, iron deficiency, blood loss from IV bleed- Iron 19. hgb 10.9 on admission,  nadir of 6.8. Required transfusion of 2 units prbc.  Also received IV iron while in house. Will discharge on oral iron. Needs periodic monitoring and consideration of epogen if iron stores repleted and remains anemic.  CORONARY ARTERY DISEASE stable  Chronic systolic CHF (congestive heart failure): compensated. No acute decompensation during hospitalization  acute renal failue with CKD (chronic kidney disease), stage III. Creatinine 2.7 on admission, slightly volume depleted. At discharge, creat 2.17, which is baseline  Falls at home, multiple. Will go to SNF for rehab and IV abx.  Type 2 diabetes, controlled, but home lantus dose decreased for morning hypoglycemic episodes.  protien calorie malnutrition: dietitian consult  Code Status: full   Consultants:  EP  Cardiology ID  IR  Procedures:  TEE x2  Pacemaker extraction  tunnelled left IJ CVC  Discharge Exam: Filed Vitals:   10/31/13 0505  BP: 142/49  Pulse: 56  Temp: 99.1 F (37.3 C)  Resp: 18    General: comfortable, oriented Cardiovascular: RRR without MGR Respiratory: CTA without WRR Chest: incision ok. Central line site ok Abd: s, nt, nd Ext no CCE  Discharge Instructions   Diet - low sodium heart healthy    Complete by:  As directed      Diet Carb Modified  Complete by:  As directed      Walk with assistance    Complete by:  As directed           Current Discharge Medication List    START taking these medications   Details  acetaminophen (TYLENOL) 325 MG tablet Take 2 tablets (650 mg total) by mouth every 6 (six) hours  as needed for mild pain or fever.    ampicillin 2 g in sodium chloride 0.9 % 50 mL Inject 2 g into the vein every 6 (six) hours. Through 12/07/13, then stop    cefTRIAXone 2 g in dextrose 5 % 50 mL Inject 2 g into the vein every 12 (twelve) hours. Through 12/07/13, then stop    docusate sodium 100 MG CAPS Take 100 mg by mouth 2 (two) times daily. Qty: 10 capsule, Refills: 0    ferrous sulfate 325 (65 FE) MG tablet Take 1 tablet (325 mg total) by mouth daily with breakfast. Refills: 3    hydrALAZINE (APRESOLINE) 10 MG tablet Take 1 tablet (10 mg total) by mouth every 8 (eight) hours.    insulin aspart (NOVOLOG) 100 UNIT/ML injection Inject 0-15 Units into the skin 3 (three) times daily with meals. Qty: 10 mL, Refills: 11    ondansetron (ZOFRAN) 4 MG tablet Take 1 tablet (4 mg total) by mouth every 6 (six) hours as needed for nausea. Qty: 20 tablet, Refills: 0      CONTINUE these medications which have CHANGED   Details  insulin glargine (LANTUS) 100 UNIT/ML injection Inject 0.1 mLs (10 Units total) into the skin at bedtime. Qty: 10 mL, Refills: 11      CONTINUE these medications which have NOT CHANGED   Details  aspirin EC 81 MG tablet Take 81 mg by mouth daily.    B Complex-C (B-COMPLEX WITH VITAMIN C) tablet Take 1 tablet by mouth daily.    carvedilol (COREG) 6.25 MG tablet Take 6.25 mg by mouth 2 (two) times daily with a meal.    ezetimibe (ZETIA) 10 MG tablet Take 10 mg by mouth daily.    furosemide (LASIX) 40 MG tablet Take 40 mg by mouth daily.    isosorbide mononitrate (IMDUR) 30 MG 24 hr tablet Take 30 mg by mouth daily.    linagliptin (TRADJENTA) 5 MG TABS tablet Take 5 mg by mouth daily.    Omega-3 Fatty Acids (FISH OIL) 1000 MG CAPS Take 1,000 mg by mouth daily.     omeprazole (PRILOSEC) 40 MG capsule Take 40 mg by mouth daily.    vitamin B-12 (CYANOCOBALAMIN) 1000 MCG tablet Take 1,000 mcg by mouth daily.    glucose 4 GM chewable tablet Chew 4 tablets (16 g  total) by mouth as needed for low blood sugar. Qty: 50 tablet, Refills: 12       Allergies  Allergen Reactions  . Lisinopril Other (See Comments)    REACTION:  Elevated potassium,renal insuf   Follow-up Information   Follow up with Cristopher Peru, MD In 4 weeks.   Specialty:  Cardiology   Contact information:   [id number] [place] [address] [phone]        The results of significant diagnostics from this hospitalization (including imaging, microbiology, ancillary and laboratory) are listed below for reference.    Significant Diagnostic Studies: Dg Chest 2 View  10/28/2013   CLINICAL DATA:  Infection and inflammatory reaction due to cardiac device, implant, or graft, initial encounter, cough, type 2 diabetes mellitus  with peripheral artery disease, hypertension, chronic renal insufficiency, CHF, cardiomyopathy not otherwise specified, COPD, coronary artery disease post MI, former smoker  EXAM: CHEST  2 VIEW  COMPARISON:  10/19/2013  FINDINGS: Enlargement of cardiac silhouette post CABG.  Calcified tortuous aorta.  Interval removal of RIGHT subclavian pacemaker.  Eventrations of the posterior hemidiaphragms bilaterally with bibasilar atelectasis.  Minimal central peribronchial thickening.  Question underlying emphysematous changes.  No acute infiltrate or pneumothorax.  Probable small bibasilar effusions.  Bones unremarkable.  IMPRESSION: Enlargement of cardiac silhouette post CABG and pacemaker removal.  Bibasilar atelectasis and probable small pleural effusions.  Question underlying emphysematous changes.   Electronically Signed   By: Lavonia Dana M.D.   On: 10/28/2013 09:04   Dg Chest 2 View  10/19/2013   CLINICAL DATA:  Fever, confusion and fall.  EXAM: CHEST  2 VIEW  COMPARISON:  04/30/2013  FINDINGS: Stable appearance of the right dual lead cardiac pacemaker. Heart and mediastinum are stable. Post CABG changes in the chest. There is mild elevation of the  posterior hemidiaphragms bilaterally and this is similar to the prior examination. No evidence for airspace disease or pleural effusions.  IMPRESSION: No acute cardiopulmonary disease.   Electronically Signed   By: Markus Daft M.D.   On: 10/19/2013 07:59   Ct Head Wo Contrast  10/19/2013   CLINICAL DATA:  Fall and complains of posterior neck pain.  EXAM: CT HEAD WITHOUT CONTRAST  CT CERVICAL SPINE WITHOUT CONTRAST  TECHNIQUE: Multidetector CT imaging of the head and cervical spine was performed following the standard protocol without intravenous contrast. Multiplanar CT image reconstructions of the cervical spine were also generated.  COMPARISON:  Head CT 07/01/2012  FINDINGS: CT HEAD FINDINGS  There is cerebral atrophy which has minimally changed. There is extensive low-density in the periventricular and subcortical white matter suggesting chronic changes. No evidence for acute hemorrhage, mass lesion, midline shift, hydrocephalus or large infarct. Again noted is an mixed sclerotic and lucent process involving the greater wing of the left sphenoid. This appears to obliterate the left sphenoid sinus and this has not significantly changed. Findings suggest a chronic benign process such as Paget's disease.  CT CERVICAL SPINE FINDINGS  Lung apices are clear. Negative for a fracture or dislocation. Multilevel degenerative disease in the cervical spine with disc space narrowing from C3-C7. Normal alignment at the cervical-thoracic junction. Multiple levels of the uncovertebral spurring.  IMPRESSION: No acute intracranial abnormality.  No acute bone abnormality in the cervical spine.  Atrophy and evidence for chronic small vessel ischemic changes.  Multilevel degenerative disease in the cervical spine.   Electronically Signed   By: Markus Daft M.D.   On: 10/19/2013 08:12   Ct Cervical Spine Wo Contrast  10/19/2013   CLINICAL DATA:  Fall and complains of posterior neck pain.  EXAM: CT HEAD WITHOUT CONTRAST  CT CERVICAL  SPINE WITHOUT CONTRAST  TECHNIQUE: Multidetector CT imaging of the head and cervical spine was performed following the standard protocol without intravenous contrast. Multiplanar CT image reconstructions of the cervical spine were also generated.  COMPARISON:  Head CT 07/01/2012  FINDINGS: CT HEAD FINDINGS  There is cerebral atrophy which has minimally changed. There is extensive low-density in the periventricular and subcortical white matter suggesting chronic changes. No evidence for acute hemorrhage, mass lesion, midline shift, hydrocephalus or large infarct. Again noted is an mixed sclerotic and lucent process involving the greater wing of the left sphenoid. This appears to obliterate the left sphenoid sinus and  this has not significantly changed. Findings suggest a chronic benign process such as Paget's disease.  CT CERVICAL SPINE FINDINGS  Lung apices are clear. Negative for a fracture or dislocation. Multilevel degenerative disease in the cervical spine with disc space narrowing from C3-C7. Normal alignment at the cervical-thoracic junction. Multiple levels of the uncovertebral spurring.  IMPRESSION: No acute intracranial abnormality.  No acute bone abnormality in the cervical spine.  Atrophy and evidence for chronic small vessel ischemic changes.  Multilevel degenerative disease in the cervical spine.   Electronically Signed   By: Markus Daft M.D.   On: 10/19/2013 08:12   Ir Fluoro Guide Cv Line Left  10/29/2013   CLINICAL DATA:  Tunneled central venous catheter requested for IV antibiotic therapy to treat pacemaker infection. Tunneled jugular catheter placement was requested due to potential need to use the upper extremities for future dialysis access given history of renal failure.  EXAM: TUNNELED CENTRAL VENOUS CATHETER PLACEMENT WITH ULTRASOUND AND FLUOROSCOPIC GUIDANCE  ANESTHESIA/SEDATION: No sedation was administered.  MEDICATIONS: None  FLUOROSCOPY TIME:  36 seconds.  PROCEDURE: The procedure,  risks, benefits, and alternatives were explained to the patient. Questions regarding the procedure were encouraged and answered. The patient understands and consents to the procedure.  The left neck and chest were prepped with chlorhexidine in a sterile fashion, and a sterile drape was applied covering the operative field. Maximum barrier sterile technique with sterile gowns and gloves were used for the procedure. Prior to the procedure, a time-out procedure was performed. Local anesthesia was provided with 1% lidocaine.  Ultrasound was used to confirm patency of the left internal jugular vein. After creating a small venotomy incision, a 21 gauge needle was advanced into the left internal jugular vein under direct, real-time ultrasound guidance. Ultrasound image documentation was performed. After securing guidewire access, a 6 French peel-away sheath was placed. A wire was kinked to measure appropriate catheter length.  A 6 French, dual-lumen tunneled power injectable central venous catheter measuring 27 cm from tip to cuff was chosen for placement. This was tunneled in a retrograde fashion from the chest wall to the venotomy incision.  The catheter was then placed through the sheath and the sheath removed. Final catheter positioning was confirmed and documented with a fluoroscopic spot image. Both lumens were aspirated and flushed with saline.  The venotomy incision was closed with subcutaneous 3-0 Monocryl and subcuticular 4-0 Vicryl. Dermabond was applied to the incision. The catheter exit site was secured with 0-Prolene retention sutures.  COMPLICATIONS: None.  No pneumothorax.  FINDINGS: After catheter placement, the tip lies at the cavoatrial junction. The catheter aspirates normally and is ready for immediate use.  IMPRESSION: Placement of tunneled central venous catheter via the left internal jugular vein. The catheter tip lies at the cavoatrial junction. The catheter is ready for immediate use.    Electronically Signed   By: Aletta Edouard M.D.   On: 10/29/2013 19:14   Ir US Guide Vasc Access Left  10/29/2013   CLINICAL DATA:  Tunneled central venous catheter requested for IV antibiotic therapy to treat pacemaker infection. Tunneled jugular catheter placement was requested due to potential need to use the upper extremities for future dialysis access given history of renal failure.  EXAM: TUNNELED CENTRAL VENOUS CATHETER PLACEMENT WITH ULTRASOUND AND FLUOROSCOPIC GUIDANCE  ANESTHESIA/SEDATION: No sedation was administered.  MEDICATIONS: None  FLUOROSCOPY TIME:  36 seconds.  PROCEDURE: The procedure, risks, benefits, and alternatives were explained to the patient. Questions regarding the procedure were  encouraged and answered. The patient understands and consents to the procedure.  The left neck and chest were prepped with chlorhexidine in a sterile fashion, and a sterile drape was applied covering the operative field. Maximum barrier sterile technique with sterile gowns and gloves were used for the procedure. Prior to the procedure, a time-out procedure was performed. Local anesthesia was provided with 1% lidocaine.  Ultrasound was used to confirm patency of the left internal jugular vein. After creating a small venotomy incision, a 21 gauge needle was advanced into the left internal jugular vein under direct, real-time ultrasound guidance. Ultrasound image documentation was performed. After securing guidewire access, a 6 French peel-away sheath was placed. A wire was kinked to measure appropriate catheter length.  A 6 French, dual-lumen tunneled power injectable central venous catheter measuring 27 cm from tip to cuff was chosen for placement. This was tunneled in a retrograde fashion from the chest wall to the venotomy incision.  The catheter was then placed through the sheath and the sheath removed. Final catheter positioning was confirmed and documented with a fluoroscopic spot image. Both lumens were  aspirated and flushed with saline.  The venotomy incision was closed with subcutaneous 3-0 Monocryl and subcuticular 4-0 Vicryl. Dermabond was applied to the incision. The catheter exit site was secured with 0-Prolene retention sutures.  COMPLICATIONS: None.  No pneumothorax.  FINDINGS: After catheter placement, the tip lies at the cavoatrial junction. The catheter aspirates normally and is ready for immediate use.  IMPRESSION: Placement of tunneled central venous catheter via the left internal jugular vein. The catheter tip lies at the cavoatrial junction. The catheter is ready for immediate use.   Electronically Signed   By: Aletta Edouard M.D.   On: 10/29/2013 19:14   Transthoracic echo - Left ventricle: The cavity size was normal. Wall thickness was increased in a pattern of mild LVH. Systolic function was moderately to severely reduced. The estimated ejection fraction was in the range of 30% to 35%. Diffuse hypokinesis. Doppler parameters are consistent with abnormal left ventricular relaxation (grade 1 diastolic dysfunction). - Ventricular septum: Septal motion showed abnormal function and dyssynergy. - Aortic valve: Trileaflet; moderately calcified leaflets. There was trivial regurgitation. - Mitral valve: There was trivial regurgitation. - Left atrium: The atrium was moderately dilated. - Right ventricle: Pacer wire or catheter noted in right ventricle. Systolic function was reduced. - Right atrium: The atrium was mildly dilated. Pacer wire or catheter noted in right atrium. Central venous pressure (est): 15 mm Hg. - Atrial septum: No defect or patent foramen ovale was identified. - Tricuspid valve: Mildly thickened leaflets. There was mild regurgitation. - Pulmonary arteries: PA peak pressure: 39 mm Hg (S). - Pericardium, extracardiac: There was no pericardial effusion.  Impressions:  - Mild LVH with LVEF approximately 30-35%, septal dyssynergy, grade 1 diastolic dysfunction.  Sclerotic aortic valve with trivial aortic regurgitation. Device wire noted in right heart. Reduced RV contraction. Thickened tricuspid valve with mild tricuspid regurgitation and PASP 39 mmHg. No obvious vegetations noted - shadowing in region of tricuspid valve likely secondary to device wire. If high suspicion of endocarditis, TEE could be considered.  TEE 10/1  - Left ventricle: Systolic function was moderately to severely reduced. The estimated ejection fraction was in the range of 30% to 35%. Wall motion was normal; there were no regional wall motion abnormalities. - Aortic valve: No vegetation. Structurally normal valve. Trileaflet; normal thickness leaflets. There was no significant regurgitation. - Aorta: There is severe non-mobile plaque in  the descending thoracic aorta. - Mitral valve: There is redundant mitral valve chordae. No evidence of vegetation. There was mild regurgitation. - Left atrium: The atrium was dilated. No evidence of thrombus in the atrial cavity or appendage. - Right atrium: No evidence of thrombus in the atrial cavity or appendage. - Pulmonic valve: No vegetation. There was mild regurgitation.  Impressions:  - There is a large 25 mm x 4 mm, highly mobile vegetation attached to the right atrial portion of the RV pacer lead. This vegetation is in the proximity to the tricuspid valve and is affected by the tricuspid valve leaflets that move the vegetation between right atrium and right ventricle. Tricuspid valve doesn&'t appear to be affected and there is stable mild tricuspid regurgitation (when compared to prior study from August 2015).  No evidence of endocarditis in the aortic, mitral and pulmonic valve.  TEE 10/6 Left ventricle: Systolic function was moderately to severely reduced. The estimated ejection fraction was in the range of 30% to 35%. Wall motion was normal; there were no regional wall motion abnormalities. - Aortic valve: No  evidence of vegetation. - Left atrium: The atrium was dilated. No evidence of thrombus in the atrial cavity or appendage. No evidence of thrombus in the atrial cavity or appendage. - Right atrium: No evidence of thrombus in the atrial cavity or appendage.  Impressions:  - There is a retained portion of the RV lead located at the RV apex with no remnant insulation or vegetation present. The lead measures 3 cm. There is no vegetation seen on the tricuspid or pulmonic valve. A central catheter is present in the right atrium.  EKG Sinus tachycardia Multiple premature complexes, vent & supraven Incomplete left bundle branch block LVH with secondary repolarization abnormality Anterior ST elevation, probably due to LV  Microbiology: Recent Results (from the past 240 hour(s))  CULTURE, BLOOD (ROUTINE X 2)     Status: None   Collection Time    10/23/13  7:40 AM      Result Value Ref Range Status   Specimen Description BLOOD LEFT HAND   Final   Special Requests BOTTLES DRAWN AEROBIC ONLY 3CC   Final   Culture  Setup Time     Final   Value: 10/23/2013 12:54     Performed at [facility]   Culture     Final   Value: NO GROWTH 5 DAYS     Performed at [facility]   Report Status 10/29/2013 FINAL   Final  CULTURE, BLOOD (ROUTINE X 2)     Status: None   Collection Time    10/23/13  7:45 AM      Result Value Ref Range Status   Specimen Description BLOOD LEFT HAND   Final   Special Requests BOTTLES DRAWN AEROBIC ONLY 4CC   Final   Culture  Setup Time     Final   Value: 10/23/2013 12:54     Performed at [facility]   Culture     Final   Value: NO GROWTH 5 DAYS     Performed at [facility]   Report Status 10/29/2013 FINAL   Final  SURGICAL PCR SCREEN     Status: None   Collection Time    10/27/13  3:30 AM      Result Value Ref Range Status   MRSA, PCR NEGATIVE  NEGATIVE Final   Staphylococcus aureus NEGATIVE  NEGATIVE Final   Comment:  The Xpert SA Assay (FDA     approved for NASAL specimens     in patients over 24 years of age),     is one component of     a comprehensive surveillance     program.  Test performance has     been validated by [employer] for patients greater     than or equal to 92 year old.     It is not intended     to diagnose infection nor to     guide or monitor treatment.     Labs: Basic Metabolic Panel:  Recent Labs Lab 10/26/13 0505 10/27/13 0310 10/27/13 1018 10/27/13 1535 10/28/13 1255 10/29/13 0550 10/31/13 0438  NA 135* 138 138  --   --  138 140  K 4.4 4.1 4.3  --   --  4.1 4.1  CL 103 105  --   --   --  105 102  CO2 23 23  --   --   --  25 25  GLUCOSE 88 122* 115*  --   --  122* 67*  BUN 32* 31*  --   --   --  30* 30*  CREATININE 2.06* 2.04*  --  2.04*  --  2.06* 2.16*  CALCIUM 8.5 8.4  --   --   --  8.5 8.8  MG  --   --   --   --  1.7  --   --    Liver Function Tests: No results found for this basename: AST, ALT, ALKPHOS, BILITOT, PROT, ALBUMIN,  in the last 168 hours No results found for this basename: LIPASE, AMYLASE,  in the last 168 hours No results found for this basename: AMMONIA,  in the last 168 hours CBC:  Recent Labs Lab 10/26/13 0505 10/27/13 0310 10/27/13 1018 10/27/13 1535 10/29/13 0550 10/30/13 0605 10/30/13 2015  WBC 5.7 4.9  --  6.7 5.6 5.3  --   NEUTROABS 3.4  --   --   --   --   --   --   HGB 6.8* 7.2* 7.1* 7.9* 7.0* 6.8* 8.2*  HCT 21.9* 22.5* 21.0* 24.9* 22.5* 22.4* 25.7*  MCV 80.2 77.3*  --  76.9* 80.9 79.4  --   PLT 251 257  --  256 253 256  --    Cardiac Enzymes: No results found for this basename: CKTOTAL, CKMB, CKMBINDEX, TROPONINI,  in the last 168 hours BNP: BNP (last 3 results)  Recent Labs  10/19/13 1200  PROBNP 2214.0*   CBG:  Recent Labs Lab 10/30/13 1202 10/30/13 1726 10/30/13 2110 10/31/13 0736 10/31/13 0806  GLUCAP 123* 186* 95 62* 88       Signed:  Ellisyn Icenhower L  [facility] 10/31/2013, 10:02 AM

## 2013-10-31 NOTE — Progress Notes (Signed)
Hypoglycemic Event  CBG: 62 at 0736  Treatment: 15 grams CHO  Symptoms: No symptoms noted  Follow-up CBG: Time:0807 CBG Result:88  Possible Reasons for Event: Patient  needed pm snack   Comments/MD notified:C. Quillian Quince M  Remember to initiate Hypoglycemia Order Set & complete

## 2013-11-05 ENCOUNTER — Non-Acute Institutional Stay (SKILLED_NURSING_FACILITY): Payer: Medicare Other | Admitting: Internal Medicine

## 2013-11-05 ENCOUNTER — Encounter: Payer: Self-pay | Admitting: Internal Medicine

## 2013-11-05 DIAGNOSIS — I339 Acute and subacute endocarditis, unspecified: Secondary | ICD-10-CM

## 2013-11-05 DIAGNOSIS — D62 Acute posthemorrhagic anemia: Secondary | ICD-10-CM

## 2013-11-05 DIAGNOSIS — R7881 Bacteremia: Secondary | ICD-10-CM

## 2013-11-05 DIAGNOSIS — R531 Weakness: Secondary | ICD-10-CM

## 2013-11-05 DIAGNOSIS — N183 Chronic kidney disease, stage 3 unspecified: Secondary | ICD-10-CM

## 2013-11-05 DIAGNOSIS — IMO0002 Reserved for concepts with insufficient information to code with codable children: Secondary | ICD-10-CM

## 2013-11-05 DIAGNOSIS — D638 Anemia in other chronic diseases classified elsewhere: Secondary | ICD-10-CM

## 2013-11-05 DIAGNOSIS — E1165 Type 2 diabetes mellitus with hyperglycemia: Secondary | ICD-10-CM

## 2013-11-05 DIAGNOSIS — D509 Iron deficiency anemia, unspecified: Secondary | ICD-10-CM

## 2013-11-05 DIAGNOSIS — N179 Acute kidney failure, unspecified: Secondary | ICD-10-CM

## 2013-11-05 DIAGNOSIS — E1129 Type 2 diabetes mellitus with other diabetic kidney complication: Secondary | ICD-10-CM

## 2013-11-05 DIAGNOSIS — I5022 Chronic systolic (congestive) heart failure: Secondary | ICD-10-CM

## 2013-11-05 NOTE — Progress Notes (Signed)
[facility]landMRN: [id number] Name: Colin [name]  Sex: male Age: 80 y.o. DOB: 1928/04/18  [facility]: [facility] Facility/Room:312 Level Of Care: SNF Provider: Inocencio Homes D Emergency Contacts: Extended Emergency Contact Information Primary Emergency Contact: Misuraca,Mildred Address: [address]          [place], [place] [address] Johnnette Litter of [place] Phone: [phone] Mobile Phone: [phone] Relation: Spouse Secondary Emergency Contact: Rosendo Gros of [place] Phone: [phone] Mobile Phone: [phone] Relation: Daughter   Allergies: Lisinopril  Chief Complaint  Patient presents with  . New Admit To SNF    HPI: Patient is 80 y.o. male who is admitted to SNF for IV abx for endocarditis and for Ot/PT for generalized weakness.  Past Medical History  Diagnosis Date  . Coronary artery disease     Status post CABG in 2004  . Peripheral vascular disease     Status post right femoropopliteal bypass  . Dyslipidemia   . Diabetes mellitus with peripheral artery disease   . Hypertension   . Chronic renal insufficiency     creatinine around 2.4  . CHF (congestive heart failure) 2007    ef 38%  . History of CVA (cerebrovascular accident)   . Cardiomyopathy   . COPD (chronic obstructive pulmonary disease)   . History of anemia   . Myocardial infarction   . Shortness of breath   . Pacemaker   . Pneumonia   . Anginal pain   . Dementia   . GERD (gastroesophageal reflux disease)   . Arthritis   . Anemia 10/29/2013    Past Surgical History  Procedure Laterality Date  . Insert / replace / remove pacemaker  05/17/2005    [facility] dual chamber ddd mode   . Coronary artery bypass graft  4 of 2004    4 vessel bypass  . Femoral bypass  july 2006    right with right great toe amputation  . Cardiac catheterization  04/29/2002    Dr. Glade Lloyd  . Esophagogastroduodenoscopy  07/21/2011    Procedure:  ESOPHAGOGASTRODUODENOSCOPY (EGD);  Surgeon: Beryle Beams, MD;  Location: [facility];  Service: Endoscopy;  Laterality: N/A;  . Coronary angioplasty    . Femoral-popliteal bypass graft Left 05/01/2013    Procedure: LEFT FEMORAL TO ABOVE KNEE POPLITEAL ARTERY BYPASS GRAFT WITH INTRAOPERATIVE ARTEROGRAM;  Surgeon: Mal Misty, MD;  Location: [facility];  Service: Vascular;  Laterality: Left;  . Amputation Left 05/05/2013    Procedure: AMPUTATION RAY- FIRST AND SECOND-LEFT FOOT;  Surgeon: Newt Minion, MD;  Location: [facility];  Service: Orthopedics;  Laterality: Left;  . Tee without cardioversion N/A 10/23/2013    Procedure: TRANSESOPHAGEAL ECHOCARDIOGRAM (TEE);  Surgeon: Dorothy Spark, MD;  Location: [facility] ENDOSCOPY;  Service: Cardiovascular;  Laterality: N/A;  . Tee without cardioversion N/A 10/28/2013    Procedure: TRANSESOPHAGEAL ECHOCARDIOGRAM (TEE);  Surgeon: Dorothy Spark, MD;  Location: [facility] ENDOSCOPY;  Service: Cardiovascular;  Laterality: N/A;  . Pacemaker lead removal Right 10/27/2013    Procedure: PACEMAKER LEAD REMOVAL;  Surgeon: Evans Lance, MD;  Location: [facility];  Service: Cardiovascular;  Laterality: Right;  Roxy Manns backing up      Medication List       This list is accurate as of: 11/05/13 11:59 PM.  Always use your most recent med list.               acetaminophen 325 MG tablet  Commonly known as:  TYLENOL  Take 2 tablets (650 mg total) by mouth every 6 (six) hours as needed for mild pain or fever.     ampicillin 2 g in sodium chloride 0.9 % 50 mL  Inject 2 g into the vein every 6 (six) hours. Through 12/07/13, then stop     aspirin EC 81 MG tablet  Take 81 mg by mouth daily.     B-complex with vitamin C tablet  Take 1 tablet by mouth daily.     carvedilol 6.25 MG tablet  Commonly known as:  COREG  Take 6.25 mg by mouth 2 (two) times daily with a meal.     cefTRIAXone 2 g in dextrose 5 % 50 mL  Inject 2 g into the vein every 12 (twelve) hours. Through 12/07/13,  then stop     DSS 100 MG Caps  Take 100 mg by mouth 2 (two) times daily.     ezetimibe 10 MG tablet  Commonly known as:  ZETIA  Take 10 mg by mouth daily.     ferrous sulfate 325 (65 FE) MG tablet  Take 1 tablet (325 mg total) by mouth daily with breakfast.     Fish Oil 1000 MG Caps  Take 1,000 mg by mouth daily.     furosemide 40 MG tablet  Commonly known as:  LASIX  Take 40 mg by mouth daily.     glucose 4 GM chewable tablet  Chew 4 tablets (16 g total) by mouth as needed for low blood sugar.     hydrALAZINE 10 MG tablet  Commonly known as:  APRESOLINE  Take 1 tablet (10 mg total) by mouth every 8 (eight) hours.     insulin aspart 100 UNIT/ML injection  Commonly known as:  novoLOG  Inject 0-15 Units into the skin 3 (three) times daily with meals.     insulin glargine 100 UNIT/ML injection  Commonly known as:  LANTUS  Inject 0.1 mLs (10 Units total) into the skin at bedtime.     isosorbide mononitrate 30 MG 24 hr tablet  Commonly known as:  IMDUR  Take 30 mg by mouth daily.     linagliptin 5 MG Tabs tablet  Commonly known as:  TRADJENTA  Take 5 mg by mouth daily.     omeprazole 40 MG capsule  Commonly known as:  PRILOSEC  Take 40 mg by mouth daily.     ondansetron 4 MG tablet  Commonly known as:  ZOFRAN  Take 1 tablet (4 mg total) by mouth every 6 (six) hours as needed for nausea.     vitamin B-12 1000 MCG tablet  Commonly known as:  CYANOCOBALAMIN  Take 1,000 mcg by mouth daily.        No orders of the defined types were placed in this encounter.    Immunization History  Administered Date(s) Administered  . Influenza,inj,Quad PF,36+ Mos 10/21/2013    History  Substance Use Topics  . Smoking status: Former Smoker -- 1.00 packs/day for 50 years    Types: Cigarettes    Quit date: 06/09/2002  . Smokeless tobacco: Never Used  . Alcohol Use: No    Family history is noncontributory    Review of Systems  DATA OBTAINED: from patient,  nurse GENERAL:  no fevers, fatigue, appetite changes SKIN: No itching, rash or wounds EYES: No eye pain, redness, discharge EARS: No earache, tinnitus, change in hearing NOSE: No congestion, drainage or bleeding  MOUTH/THROAT: No mouth or tooth  pain, No sore throat RESPIRATORY: No cough, wheezing, SOB CARDIAC: No chest pain, palpitations, lower extremity edema  GI: No abdominal pain, No N/V/D or constipation, No heartburn or reflux  GU: No dysuria, frequency or urgency, or incontinence  MUSCULOSKELETAL: No unrelieved bone/joint pain NEUROLOGIC: No headache, dizziness o PSYCHIATRIC: No overt anxiety or sadness, No behavior issue.   Filed Vitals:   11/05/13 0931  BP: 147/68  Pulse: 68  Temp: 98.8 F (37.1 C)  Resp: 18    Physical Exam  GENERAL APPEARANCE: Alert,mod  conversant,  No acute distress.  SKIN: No diaphoresis rash HEAD: Normocephalic, atraumatic  EYES: Conjunctiva/lids clear. Pupils round, reactive. EOMs intact.  EARS: External exam WNL, canals clear. Hearing grossly normal.  NOSE: No deformity or discharge.  MOUTH/THROAT: Lips w/o lesions  RESPIRATORY: Breathing is even, unlabored. Lung sounds are clear   CARDIOVASCULAR: Heart RRR no murmurs, rubs or gallops. No peripheral edema.   GASTROINTESTINAL: Abdomen is soft, non-tender, not distended w/ normal bowel sounds. GENITOURINARY: Bladder non tender, not distended  MUSCULOSKELETAL: No abnormal joints or musculature NEUROLOGIC:  Cranial nerves 2-12 grossly intact. Moves all extremities  PSYCHIATRIC:  no behavioral issues  Patient Active Problem List   Diagnosis Date Noted  . Acute endocarditis 11/06/2013  . Anemia, iron deficiency 11/06/2013  . Anemia of chronic disease 11/06/2013  . Acute blood loss anemia 10/29/2013  . Weakness generalized 10/20/2013  . Bacteremia 10/20/2013  . Fever of unknown origin (FUO) 10/19/2013  . Type 2 diabetes, uncontrolled, with renal manifestation 06/03/2013  . PAD (peripheral  artery disease) 04/30/2013  . Atherosclerosis of native arteries of the extremities with gangrene 04/29/2013  . Other stiff joint, of the lower leg 12/31/2012  . Fall 07/01/2012  . Hypoglycemia 07/01/2012  . Hypothermia 07/01/2012  . Type 2 diabetes mellitus with hyperosmolar nonketotic hyperglycemia 02/27/2012  . Failure to thrive in adult 02/27/2012  . Non-compliant patient 02/27/2012  . Microscopic hematuria 01/11/2012  . Syncope 01/09/2012  . CKD (chronic kidney disease), stage III 01/09/2012  . Diabetes mellitus with peripheral artery disease   . Pain in limb 12/29/2011  . Peripheral vascular disease, unspecified 12/29/2011  . DOE (dyspnea on exertion) 04/10/2011  . Chronic systolic CHF (congestive heart failure)   . Peripheral vascular disease   . Dyslipidemia   . Acute kidney injury   . History of CVA (cerebrovascular accident)   . Cardiomyopathy   . COPD (chronic obstructive pulmonary disease)   . History of anemia   . CORONARY ARTERY DISEASE 11/30/2009  . Cardiac pacemaker in situ 11/30/2009    CBC    Component Value Date/Time   WBC 5.3 10/30/2013 0605   RBC 2.82* 10/30/2013 0605   HGB 8.2* 10/30/2013 2015   HCT 25.7* 10/30/2013 2015   PLT 256 10/30/2013 0605   MCV 79.4 10/30/2013 0605   LYMPHSABS 1.3 10/26/2013 0505   MONOABS 0.8 10/26/2013 0505   EOSABS 0.2 10/26/2013 0505   BASOSABS 0.0 10/26/2013 0505    CMP     Component Value Date/Time   NA 140 10/31/2013 0438   K 4.1 10/31/2013 0438   CL 102 10/31/2013 0438   CO2 25 10/31/2013 0438   GLUCOSE 67* 10/31/2013 0438   BUN 30* 10/31/2013 0438   CREATININE 2.16* 10/31/2013 0438   CALCIUM 8.8 10/31/2013 0438   PROT 6.9 10/20/2013 0532   ALBUMIN 2.2* 10/20/2013 0532   AST 20 10/20/2013 0532   ALT 14 10/20/2013 0532   ALKPHOS 65 10/20/2013 0532  BILITOT 0.3 10/20/2013 0532   GFRNONAA 27* 10/31/2013 0438   GFRAA 31* 10/31/2013 0438    Assessment and Plan  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  TEE: 4 x 25 mm vegetation attached to the right atrial portion of the RV pacer lead. S/p extraction except small remnant. Repeat TEE shows no remnant insulation or vegetation.  -ID consult- needs 6 weeks of IV abx: weeks of ceftriaxone 2 grams once a day and ampicillin every 6 hours through Nov 15th  Due to CKD, nephrology recommended tunneled central line instead of PICC in case CKD were to progress and patient were to require dialysis access in the future. Please arrange IR to remove CVL after completion of antibiotics   Acute blood loss anemia From IV line; Iron 19. hgb 10.9 on admission, nadir of 6.8. Required transfusion of 2 units prbc. Also received IV iron while in house. Will discharge on oral iron. Needs periodic monitoring and consideration of epogen if iron stores repleted and remains anemic.   Anemia, iron deficiency Iron 19. hgb 10.9 on admission, nadir of 6.8. Required transfusion of 2 units prbc. Also received IV iron while in house. Will discharge on oral iron. Needs periodic monitoring and consideration of epogen if iron stores repleted and remains anemic.   Anemia of chronic disease Iron 19. hgb 10.9 on admission, nadir of 6.8. Required transfusion of 2 units prbc. Also received IV iron while in house. Will discharge on oral iron. Needs periodic monitoring and consideration of epogen if iron stores repleted and remains anemic.   Chronic systolic CHF (congestive heart failure) compensated. No acute decompensation during hospitalization   CKD (chronic kidney disease), stage III Creatinine 2.7 on admission, slightly volume depleted. At discharge, creat 2.17, which is baseline   Acute kidney injury Creatinine 2.7 on admission, slightly volume depleted. At discharge, creat 2.17, which is baseline   Type 2 diabetes, uncontrolled, with renal manifestation home lantus dose decreased for morning hypoglycemic episodes.       Weakness generalized With falls;admit to SNF for OT/PT  Bacteremia 2/2 endocarditis;see endocarditis above    Hennie Duos, MD

## 2013-11-06 ENCOUNTER — Ambulatory Visit: Payer: Medicare Other

## 2013-11-06 ENCOUNTER — Encounter: Payer: Self-pay | Admitting: Internal Medicine

## 2013-11-06 DIAGNOSIS — D509 Iron deficiency anemia, unspecified: Secondary | ICD-10-CM | POA: Insufficient documentation

## 2013-11-06 DIAGNOSIS — D638 Anemia in other chronic diseases classified elsewhere: Secondary | ICD-10-CM | POA: Insufficient documentation

## 2013-11-06 DIAGNOSIS — Z8679 Personal history of other diseases of the circulatory system: Secondary | ICD-10-CM | POA: Insufficient documentation

## 2013-11-06 NOTE — Assessment & Plan Note (Signed)
Creatinine 2.7 on admission, slightly volume depleted. At discharge, creat 2.17, which is baseline

## 2013-11-06 NOTE — Assessment & Plan Note (Signed)
compensated. No acute decompensation during hospitalization

## 2013-11-06 NOTE — Assessment & Plan Note (Signed)
2/2 endocarditis;see endocarditis above

## 2013-11-06 NOTE — Assessment & Plan Note (Signed)
Iron 19. hgb 10.9 on admission, nadir of 6.8. Required transfusion of 2 units prbc. Also received IV iron while in house. Will discharge on oral iron. Needs periodic monitoring and consideration of epogen if iron stores repleted and remains anemic.

## 2013-11-06 NOTE — Assessment & Plan Note (Signed)
Enterococcus Bacteremia/Pacemaker lead endocarditis:  ID, cardiology and EP consulted.  TEE: 4 x 25 mm vegetation attached to the right atrial portion of the RV pacer lead. S/p extraction except small remnant. Repeat TEE shows no remnant insulation or vegetation.  -ID consult- needs 6 weeks of IV abx: weeks of ceftriaxone 2 grams once a day and ampicillin every 6 hours through Nov 15th  Due to CKD, nephrology recommended tunneled central line instead of PICC in case CKD were to progress and patient were to require dialysis access in the future. Please arrange IR to remove CVL after completion of antibiotics

## 2013-11-06 NOTE — Assessment & Plan Note (Signed)
With falls;admit to SNF for OT/PT

## 2013-11-06 NOTE — Assessment & Plan Note (Signed)
home lantus dose decreased for morning hypoglycemic episodes.

## 2013-11-06 NOTE — Assessment & Plan Note (Signed)
From IV line; Iron 19. hgb 10.9 on admission, nadir of 6.8. Required transfusion of 2 units prbc. Also received IV iron while in house. Will discharge on oral iron. Needs periodic monitoring and consideration of epogen if iron stores repleted and remains anemic.

## 2013-11-19 ENCOUNTER — Ambulatory Visit (INDEPENDENT_AMBULATORY_CARE_PROVIDER_SITE_OTHER): Payer: Medicare Other | Admitting: Internal Medicine

## 2013-11-19 ENCOUNTER — Encounter: Payer: Self-pay | Admitting: Internal Medicine

## 2013-11-19 VITALS — BP 132/60 | HR 60 | Temp 98.0°F | Wt 172.0 lb

## 2013-11-19 DIAGNOSIS — T827XXA Infection and inflammatory reaction due to other cardiac and vascular devices, implants and grafts, initial encounter: Secondary | ICD-10-CM | POA: Insufficient documentation

## 2013-11-19 DIAGNOSIS — T827XXD Infection and inflammatory reaction due to other cardiac and vascular devices, implants and grafts, subsequent encounter: Secondary | ICD-10-CM

## 2013-11-19 NOTE — Progress Notes (Signed)
   Subjective:    Patient ID: Colin [name], male    DOB: January 24, 1928, 80 y.o.   MRN: [id number]  HPI 80 yo male with CAD, s/p CABG in 2004, pacemaker, PVD with fem-pop, CHF came in to hospital with fever, increased WBC and grew Enterococcus in 2/2 blood cultures. Noted vegetation on pacemaker and underwent extraction of device.  He did have a small remnant of the lead that was left over but no remnant insulation or vegetation.  Had TEE negative for valvular vegetation.  Was ampicillin sensitive and continued on 6 weeks of dual beta lactam therapy with ceftriaxone and ampicillin.  To get through November 15th. No issues, no fever, no chills.  Feels well.       Review of Systems  Constitutional: Negative for fever, chills and fatigue.  Gastrointestinal: Negative for nausea and diarrhea.  Skin: Negative for rash.  Neurological: Negative for dizziness and light-headedness.  Psychiatric/Behavioral: Negative for sleep disturbance.       Objective:   Physical Exam  Constitutional: He appears well-developed and well-nourished.  In wheelchair pleasant  Eyes: No scleral icterus.  Cardiovascular: Normal rate, regular rhythm and normal heart sounds.   No murmur heard. Pulmonary/Chest: Effort normal and breath sounds normal. No respiratory distress.  Skin: No rash noted.          Assessment & Plan:

## 2013-11-19 NOTE — Assessment & Plan Note (Signed)
Doing well.  Will plan to continue through November 15th and we will arrange to have his CVL removed on Nov 16th.  He will follow up with [place] just prior to that to confirm he is doing well.  He has cardiology follow up arranged and they have no current plans to replace the pacemaker.

## 2013-11-20 NOTE — Addendum Note (Signed)
Addended by: Thayer Headings on: 11/20/2013 12:16 PM   Modules accepted: Orders

## 2013-11-21 ENCOUNTER — Telehealth: Payer: Self-pay | Admitting: *Deleted

## 2013-11-21 NOTE — Telephone Encounter (Signed)
Pt at [facility].  Morene Antu called to let us know she discovered a med error.  Patient was discharged with Ceftriaxone and Ampicillin through 11/15.  Ceftriaxone was incorrectly documented on their paperwork as ending 10/15; as a result, he missed ceftriaxone 10/16 through 10/26. It was restarted 10/27. Patient continued ampicillin uninterrupted.  Patient is already scheduled for follow up 11/12.  Please advise if any changes need to be made or if any labs should be ordered. Landis Gandy, RN

## 2013-11-21 NOTE — Telephone Encounter (Signed)
Verbal order per Dr. Linus Salmons given to [facility] at [facility] [phone] for tunneled picc removal at [facility] short stay for 12/08/13 at 10:30 AM. Nothing to eat or drink after midnight. May take necessary medication with small amount of water except for all diabetes medication; he will receive this at the hospital.

## 2013-11-21 NOTE — Telephone Encounter (Signed)
Dr.Comer his picc line was scheduled to be removed on 12/08/13. Please let me know if this needs to be changed.

## 2013-11-24 NOTE — Telephone Encounter (Signed)
Confirmed that patient has no disruption in ampicillin, will complete therapy as prescribed, will have PICC removed 11/16 as planned.

## 2013-11-24 NOTE — Telephone Encounter (Signed)
Just document it but no need to extend therapy if he continued to get ampicillin. thanks

## 2013-12-01 ENCOUNTER — Non-Acute Institutional Stay (SKILLED_NURSING_FACILITY): Payer: Medicare Other | Admitting: Nurse Practitioner

## 2013-12-01 DIAGNOSIS — E1165 Type 2 diabetes mellitus with hyperglycemia: Secondary | ICD-10-CM

## 2013-12-01 DIAGNOSIS — E1129 Type 2 diabetes mellitus with other diabetic kidney complication: Secondary | ICD-10-CM

## 2013-12-01 DIAGNOSIS — E1159 Type 2 diabetes mellitus with other circulatory complications: Secondary | ICD-10-CM

## 2013-12-01 DIAGNOSIS — I339 Acute and subacute endocarditis, unspecified: Secondary | ICD-10-CM

## 2013-12-01 DIAGNOSIS — N183 Chronic kidney disease, stage 3 unspecified: Secondary | ICD-10-CM

## 2013-12-01 DIAGNOSIS — E1151 Type 2 diabetes mellitus with diabetic peripheral angiopathy without gangrene: Secondary | ICD-10-CM

## 2013-12-01 DIAGNOSIS — Z8673 Personal history of transient ischemic attack (TIA), and cerebral infarction without residual deficits: Secondary | ICD-10-CM

## 2013-12-01 DIAGNOSIS — D509 Iron deficiency anemia, unspecified: Secondary | ICD-10-CM

## 2013-12-01 DIAGNOSIS — E785 Hyperlipidemia, unspecified: Secondary | ICD-10-CM

## 2013-12-01 DIAGNOSIS — I739 Peripheral vascular disease, unspecified: Secondary | ICD-10-CM

## 2013-12-01 DIAGNOSIS — IMO0002 Reserved for concepts with insufficient information to code with codable children: Secondary | ICD-10-CM

## 2013-12-01 NOTE — Progress Notes (Signed)
Patient ID: Colin [name], male   DOB: 24-Sep-1928, 80 y.o.   MRN: [id number]    PCP: Horatio Pel, MD  Allergies  Allergen Reactions  . Lisinopril Other (See Comments)    REACTION:  Elevated potassium,renal insuf    Chief Complaint  Patient presents with  . Discharge Note     HPI: Patient is a 80 y.o. male seen in the office today for discharge home from SNF; pt was admitted for IV therapy and PT/OT after hospitalization due to endocarditis. Pt completes treatment next week and will be ready for discharge home with home health therapies   Review of Systems:  Review of Systems  Constitutional: Negative for activity change, appetite change, fatigue and unexpected weight change.  HENT: Negative for congestion and hearing loss.   Eyes: Negative.   Respiratory: Negative for cough and shortness of breath.   Cardiovascular: Negative for chest pain, palpitations and leg swelling.  Gastrointestinal: Negative for abdominal pain, diarrhea and constipation.  Genitourinary: Negative for dysuria and difficulty urinating.  Musculoskeletal: Negative for myalgias and arthralgias.  Skin: Negative for color change and wound.  Neurological: Negative for dizziness and weakness.  Psychiatric/Behavioral: Negative for behavioral problems, confusion and agitation.    Past Medical History  Diagnosis Date  . Coronary artery disease     Status post CABG in 2004  . Peripheral vascular disease     Status post right femoropopliteal bypass  . Dyslipidemia   . Diabetes mellitus with peripheral artery disease   . Hypertension   . Chronic renal insufficiency     creatinine around 2.4  . CHF (congestive heart failure) 2007    ef 38%  . History of CVA (cerebrovascular accident)   . Cardiomyopathy   . COPD (chronic obstructive pulmonary disease)   . History of anemia   . Myocardial infarction   . Shortness of breath   . Pacemaker   . Pneumonia   . Anginal pain   . Dementia   . GERD  (gastroesophageal reflux disease)   . Arthritis   . Anemia 10/29/2013   Past Surgical History  Procedure Laterality Date  . Insert / replace / remove pacemaker  05/17/2005    st. jude dual chamber ddd mode   . Coronary artery bypass graft  4 of 2004    4 vessel bypass  . Femoral bypass  july 2006    right with right great toe amputation  . Cardiac catheterization  04/29/2002    Dr. Glade Lloyd  . Esophagogastroduodenoscopy  07/21/2011    Procedure: ESOPHAGOGASTRODUODENOSCOPY (EGD);  Surgeon: Beryle Beams, MD;  Location: [facility];  Service: Endoscopy;  Laterality: N/A;  . Coronary angioplasty    . Femoral-popliteal bypass graft Left 05/01/2013    Procedure: LEFT FEMORAL TO ABOVE KNEE POPLITEAL ARTERY BYPASS GRAFT WITH INTRAOPERATIVE ARTEROGRAM;  Surgeon: Mal Misty, MD;  Location: [place];  Service: Vascular;  Laterality: Left;  . Amputation Left 05/05/2013    Procedure: AMPUTATION RAY- FIRST AND SECOND-LEFT FOOT;  Surgeon: Newt Minion, MD;  Location: [place];  Service: Orthopedics;  Laterality: Left;  . Tee without cardioversion N/A 10/23/2013    Procedure: TRANSESOPHAGEAL ECHOCARDIOGRAM (TEE);  Surgeon: Dorothy Spark, MD;  Location: [facility];  Service: Cardiovascular;  Laterality: N/A;  . Tee without cardioversion N/A 10/28/2013    Procedure: TRANSESOPHAGEAL ECHOCARDIOGRAM (TEE);  Surgeon: Dorothy Spark, MD;  Location: [place];  Service: Cardiovascular;  Laterality: N/A;  . Pacemaker lead removal Right 10/27/2013  Procedure: PACEMAKER LEAD REMOVAL;  Surgeon: Evans Lance, MD;  Location: [place];  Service: Cardiovascular;  Laterality: Right;  Roxy Manns backing up   Social History:   reports that he quit smoking about 11 years ago. His smoking use included Cigarettes. He has a 50 pack-year smoking history. He has never used smokeless tobacco. He reports that he does not drink alcohol or use illicit drugs.  Family History  Problem Relation Age of Onset  . Hyperlipidemia Mother    . Heart disease Father   . Diabetes    . Diabetes Sister   . Hypertension Sister   . Hyperlipidemia Sister     Medications: Patient's Medications  New Prescriptions   No medications on file  Previous Medications   ACETAMINOPHEN (TYLENOL) 325 MG TABLET    Take 2 tablets (650 mg total) by mouth every 6 (six) hours as needed for mild pain or fever.   AMPICILLIN 2 G IN SODIUM CHLORIDE 0.9 % 50 ML    Inject 2 g into the vein every 6 (six) hours. Through 12/07/13, then stop   ASPIRIN EC 81 MG TABLET    Take 81 mg by mouth daily.   B COMPLEX-C (B-COMPLEX WITH VITAMIN C) TABLET    Take 1 tablet by mouth daily.   CARVEDILOL (COREG) 6.25 MG TABLET    Take 6.25 mg by mouth 2 (two) times daily with a meal.   CEFTRIAXONE 2 G IN DEXTROSE 5 % 50 ML    Inject 2 g into the vein every 12 (twelve) hours. Through 12/07/13, then stop   DOCUSATE SODIUM 100 MG CAPS    Take 100 mg by mouth 2 (two) times daily.   EZETIMIBE (ZETIA) 10 MG TABLET    Take 10 mg by mouth daily.   FERROUS SULFATE 325 (65 FE) MG TABLET    Take 1 tablet (325 mg total) by mouth daily with breakfast.   FUROSEMIDE (LASIX) 40 MG TABLET    Take 40 mg by mouth daily.   GLUCOSE 4 GM CHEWABLE TABLET    Chew 4 tablets (16 g total) by mouth as needed for low blood sugar.   HYDRALAZINE (APRESOLINE) 10 MG TABLET    Take 1 tablet (10 mg total) by mouth every 8 (eight) hours.   INSULIN ASPART (NOVOLOG) 100 UNIT/ML INJECTION    Inject 0-15 Units into the skin 3 (three) times daily with meals.   INSULIN GLARGINE (LANTUS) 100 UNIT/ML INJECTION    Inject 0.1 mLs (10 Units total) into the skin at bedtime.   ISOSORBIDE MONONITRATE (IMDUR) 30 MG 24 HR TABLET    Take 30 mg by mouth daily.   LINAGLIPTIN (TRADJENTA) 5 MG TABS TABLET    Take 5 mg by mouth daily.   OMEGA-3 FATTY ACIDS (FISH OIL) 1000 MG CAPS    Take 1,000 mg by mouth daily.    OMEPRAZOLE (PRILOSEC) 40 MG CAPSULE    Take 40 mg by mouth daily.   ONDANSETRON (ZOFRAN) 4 MG TABLET    Take 1  tablet (4 mg total) by mouth every 6 (six) hours as needed for nausea.   VITAMIN B-12 (CYANOCOBALAMIN) 1000 MCG TABLET    Take 1,000 mcg by mouth daily.  Modified Medications   No medications on file  Discontinued Medications   No medications on file     Physical Exam:  Filed Vitals:   12/01/13 1624  BP: 152/72  Pulse: 66  Temp: 97 F (36.1 C)  Resp: 20  Physical Exam  Constitutional: He is oriented to person, place, and time. He appears well-developed and well-nourished. No distress.  HENT:  Head: Normocephalic and atraumatic.  Mouth/Throat: Oropharynx is clear and moist. No oropharyngeal exudate.  Eyes: Conjunctivae and EOM are normal. Pupils are equal, round, and reactive to light.  Neck: Normal range of motion. Neck supple.  Cardiovascular: Normal rate, regular rhythm and normal heart sounds.   Pulmonary/Chest: Effort normal and breath sounds normal.  Abdominal: Soft. Bowel sounds are normal.  Musculoskeletal: He exhibits no edema or tenderness.  Neurological: He is alert and oriented to person, place, and time.  Skin: Skin is warm and dry. He is not diaphoretic.  Psychiatric: He has a normal mood and affect.    Labs reviewed: Basic Metabolic Panel:  Recent Labs  10/19/13 1200  10/27/13 0310 10/27/13 1018 10/27/13 1535 10/28/13 1255 10/29/13 0550 10/31/13 0438  NA  --   < > 138 138  --   --  138 140  K  --   < > 4.1 4.3  --   --  4.1 4.1  CL  --   < > 105  --   --   --  105 102  CO2  --   < > 23  --   --   --  25 25  GLUCOSE  --   < > 122* 115*  --   --  122* 67*  BUN  --   < > 31*  --   --   --  30* 30*  CREATININE 2.51*  < > 2.04*  --  2.04*  --  2.06* 2.16*  CALCIUM  --   < > 8.4  --   --   --  8.5 8.8  MG  --   --   --   --   --  1.7  --   --   TSH 1.500  --   --   --   --   --   --   --   < > = values in this interval not displayed. Liver Function Tests:  Recent Labs  04/30/13 2100 10/19/13 0651 10/20/13 0532  AST 16 24 20   ALT 15 18 14     ALKPHOS 61 84 65  BILITOT 0.4 0.7 0.3  PROT 7.4 9.4* 6.9  ALBUMIN 2.1* 2.9* 2.2*   No results for input(s): LIPASE, AMYLASE in the last 8760 hours. No results for input(s): AMMONIA in the last 8760 hours. CBC:  Recent Labs  10/19/13 0651  10/21/13 0554  10/26/13 0505  10/27/13 1535 10/29/13 0550 10/30/13 0605 10/30/13 2015  WBC 12.0*  < > 9.0  < > 5.7  < > 6.7 5.6 5.3  --   NEUTROABS 9.8*  --  6.3  --  3.4  --   --   --   --   --   HGB 10.9*  < > 9.0*  < > 6.8*  < > 7.9* 7.0* 6.8* 8.2*  HCT 34.5*  < > 28.3*  < > 21.9*  < > 24.9* 22.5* 22.4* 25.7*  MCV 77.5*  < > 76.3*  < > 80.2  < > 76.9* 80.9 79.4  --   PLT 250  < > 218  < > 251  < > 256 253 256  --   < > = values in this interval not displayed. Lipid Panel: No results for input(s): CHOL, HDL, LDLCALC, TRIG, CHOLHDL, LDLDIRECT in the last 8760 hours. TSH:  Recent Labs  10/19/13 1200  TSH 1.500   A1C: Lab Results  Component Value Date   HGBA1C 7.9* 10/19/2013   HGBA1C 7.9* 10/19/2013     Assessment/Plan 1. Dyslipidemia -conts fish oil and zetia  2. Type 2 diabetes, uncontrolled, with renal manifestation lantus dose was reduced in the hospital, SSI added but not being used frequently at [facility]. Will dc Novolog and cont tradjenta and Lantus 10 units on discharge  3. CKD (chronic kidney disease), stage III -Cr remains at baseline and to be followed by PCP  4. History of CVA (cerebrovascular accident) -cont on asa  5. Anemia, iron deficiency conts on iron, hgb to be followed by PCP  6. Chronic systolic CHF (congestive heart failure Has been stable, conts lasix, coreg; has outpatient follow up with cardiology scheduled  7. Acute endocarditis conts IV antibiotics per ID until the 15th; has follow up with ID on 13th of this month and scheduled for central line to be removed on the 16.  Okay to discharge home with wife ONCE central line has been removed.  pt is stable for discharge-will need PT/OT per home  health. Rx written.  will need to follow up with PCP within 2 weeks.

## 2013-12-04 ENCOUNTER — Other Ambulatory Visit: Payer: Self-pay | Admitting: Radiology

## 2013-12-04 ENCOUNTER — Ambulatory Visit (INDEPENDENT_AMBULATORY_CARE_PROVIDER_SITE_OTHER): Payer: Medicare Other | Admitting: Internal Medicine

## 2013-12-04 ENCOUNTER — Telehealth: Payer: Self-pay | Admitting: *Deleted

## 2013-12-04 ENCOUNTER — Encounter: Payer: Self-pay | Admitting: Internal Medicine

## 2013-12-04 VITALS — BP 165/59 | HR 59 | Temp 98.1°F | Wt 171.0 lb

## 2013-12-04 DIAGNOSIS — R7881 Bacteremia: Secondary | ICD-10-CM

## 2013-12-04 DIAGNOSIS — B952 Enterococcus as the cause of diseases classified elsewhere: Secondary | ICD-10-CM

## 2013-12-04 DIAGNOSIS — T827XXD Infection and inflammatory reaction due to other cardiac and vascular devices, implants and grafts, subsequent encounter: Secondary | ICD-10-CM

## 2013-12-04 NOTE — Progress Notes (Signed)
Subjective:    Patient ID: Colin [name], male    DOB: 01/21/1929, 80 y.o.   MRN: [id number]  HPI 80 yo male with CAD, s/p CABG in 2004, pacemaker, PVD with fem-pop, CHF who was recently admitted for fever, leukocytosis and found to have Enterococcus (amp S) bacteremia in 2/2 blood cultures.work up revealed vegetation on pacemaker and underwent cardiac device extraction. He did have a small remnant of the lead that was left over but no remnant insulation or vegetation.TEE negative for valvular vegetation. he was discharged on 6 weeks of dual beta lactam therapy with ceftriaxone and ampicillin, to end on November 15th.he has been receiving antibiotics at [facility]. He has upcoming  appt with IR to remove tunneled catheter on 11/16th. No issues, no fever, no chills. Feels well.getting rehabilitation and PT at the current facility.  Current Outpatient Prescriptions on File Prior to Visit  Medication Sig Dispense Refill  . acetaminophen (TYLENOL) 325 MG tablet Take 2 tablets (650 mg total) by mouth every 6 (six) hours as needed for mild pain or fever.    Marland Kitchen ampicillin 2 g in sodium chloride 0.9 % 50 mL Inject 2 g into the vein every 6 (six) hours. Through 12/07/13, then stop    . aspirin EC 81 MG tablet Take 81 mg by mouth daily.    . B Complex-C (B-COMPLEX WITH VITAMIN C) tablet Take 1 tablet by mouth daily.    . carvedilol (COREG) 6.25 MG tablet Take 6.25 mg by mouth 2 (two) times daily with a meal.    . cefTRIAXone 2 g in dextrose 5 % 50 mL Inject 2 g into the vein every 12 (twelve) hours. Through 12/07/13, then stop    . docusate sodium 100 MG CAPS Take 100 mg by mouth 2 (two) times daily. 10 capsule 0  . ezetimibe (ZETIA) 10 MG tablet Take 10 mg by mouth daily.    . ferrous sulfate 325 (65 FE) MG tablet Take 1 tablet (325 mg total) by mouth daily with breakfast.  3  . furosemide (LASIX) 40 MG tablet Take 40 mg by mouth daily.    Marland Kitchen glucose 4 GM chewable tablet Chew 4 tablets (16 g total) by  mouth as needed for low blood sugar. 50 tablet 12  . hydrALAZINE (APRESOLINE) 10 MG tablet Take 1 tablet (10 mg total) by mouth every 8 (eight) hours.    . insulin glargine (LANTUS) 100 UNIT/ML injection Inject 0.1 mLs (10 Units total) into the skin at bedtime. 10 mL 11  . isosorbide mononitrate (IMDUR) 30 MG 24 hr tablet Take 30 mg by mouth daily.    Marland Kitchen linagliptin (TRADJENTA) 5 MG TABS tablet Take 5 mg by mouth daily.    . Omega-3 Fatty Acids (FISH OIL) 1000 MG CAPS Take 1,000 mg by mouth daily.     Marland Kitchen omeprazole (PRILOSEC) 40 MG capsule Take 40 mg by mouth daily.    . ondansetron (ZOFRAN) 4 MG tablet Take 1 tablet (4 mg total) by mouth every 6 (six) hours as needed for nausea. 20 tablet 0  . vitamin B-12 (CYANOCOBALAMIN) 1000 MCG tablet Take 1,000 mcg by mouth daily.     No current facility-administered medications on file prior to visit.   Active Ambulatory Problems    Diagnosis Date Noted  . CORONARY ARTERY DISEASE 11/30/2009  . Cardiac pacemaker in situ 11/30/2009  . Chronic systolic CHF (congestive heart failure)   . Peripheral vascular disease   . Dyslipidemia   . Acute  kidney injury   . History of CVA (cerebrovascular accident)   . Cardiomyopathy   . COPD (chronic obstructive pulmonary disease)   . History of anemia   . DOE (dyspnea on exertion) 04/10/2011  . Pain in limb 12/29/2011  . Peripheral vascular disease, unspecified 12/29/2011  . Syncope 01/09/2012  . CKD (chronic kidney disease), stage III 01/09/2012  . Diabetes mellitus with peripheral artery disease   . Microscopic hematuria 01/11/2012  . Type 2 diabetes mellitus with hyperosmolar nonketotic hyperglycemia 02/27/2012  . Failure to thrive in adult 02/27/2012  . Non-compliant patient 02/27/2012  . Fall 07/01/2012  . Hypoglycemia 07/01/2012  . Hypothermia 07/01/2012  . Other stiff joint, of the lower leg 12/31/2012  . Atherosclerosis of native arteries of the extremities with gangrene 04/29/2013  . PAD  (peripheral artery disease) 04/30/2013  . Type 2 diabetes, uncontrolled, with renal manifestation 06/03/2013  . Fever of unknown origin (FUO) 10/19/2013  . Weakness generalized 10/20/2013  . Bacteremia 10/20/2013  . Acute blood loss anemia 10/29/2013  . Acute endocarditis 11/06/2013  . Anemia, iron deficiency 11/06/2013  . Anemia of chronic disease 11/06/2013  . Infection of pacemaker lead wire 11/19/2013   Resolved Ambulatory Problems    Diagnosis Date Noted  . Fever 10/19/2013   Past Medical History  Diagnosis Date  . Coronary artery disease   . Hypertension   . Chronic renal insufficiency   . CHF (congestive heart failure) 2007  . Myocardial infarction   . Shortness of breath   . Pacemaker   . Pneumonia   . Anginal pain   . Dementia   . GERD (gastroesophageal reflux disease)   . Arthritis   . Anemia 10/29/2013       Review of Systems Deconditioned+ working with PT. No fever ,chills, shortness of breath, diarrhea, rash, nightsweats, joint pain. Good appetite    Objective:   Physical Exam BP 165/59 mmHg  Pulse 59  Temp(Src) 98.1 F (36.7 C) (Oral)  Wt 171 lb (77.565 kg) Physical Exam  Constitutional: He is oriented to person, place, and time. He appears well-developed and well-nourished. Chronically ill, No distress.  HENT:  Mouth/Throat: Oropharynx is clear and moist. No oropharyngeal exudate.  Cardiovascular: irregular, irregular. Soft 2/6 SEM BH at apex Exam reveals no gallop and no friction rub.  No murmur heard.  Pulmonary/Chest: Effort normal and breath sounds normal. No respiratory distress. He has no wheezes.  Chest wall = pacer pocket well healed, sutures still in place. Left subclavian tunneled catheter Lymphadenopathy:  He has no cervical adenopathy.  Skin: Skin is warm and dry. No rash noted. No erythema.  Psychiatric: He has a normal mood and affect. His behavior is normal.        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 Will get follow up blood cx next week by rehab center to ensure ongoing clearance of bacteremia  Pacemaker pocket =  has appt for suture removal next week  Return to clinic if he has fever, chills, nightsweats

## 2013-12-04 NOTE — Telephone Encounter (Signed)
Pt needs to arrive at [facility] @ 1130 Mon., Nov. 16 for tunneled PICC removal.  No special preparation is necessary.  Removal will take about 15 minutes.  Notified [facility] of appt and no special preparation.  Verbalized understanding.

## 2013-12-05 ENCOUNTER — Encounter: Payer: Self-pay | Admitting: Internal Medicine

## 2013-12-05 ENCOUNTER — Ambulatory Visit (INDEPENDENT_AMBULATORY_CARE_PROVIDER_SITE_OTHER): Payer: Medicare Other | Admitting: Internal Medicine

## 2013-12-05 VITALS — BP 140/70 | HR 59 | Ht 71.0 in | Wt 169.8 lb

## 2013-12-05 DIAGNOSIS — I5022 Chronic systolic (congestive) heart failure: Secondary | ICD-10-CM

## 2013-12-05 DIAGNOSIS — T827XXD Infection and inflammatory reaction due to other cardiac and vascular devices, implants and grafts, subsequent encounter: Secondary | ICD-10-CM

## 2013-12-05 LAB — MDC_IDC_ENUM_SESS_TYPE_INCLINIC: MDC IDC PG SERIAL: 1686193

## 2013-12-05 NOTE — Assessment & Plan Note (Signed)
His symptoms are class two. He will continue his current meds.

## 2013-12-05 NOTE — Assessment & Plan Note (Signed)
He denies anginal symptoms will followup as needed.

## 2013-12-05 NOTE — Assessment & Plan Note (Signed)
He has had no evidence of any active or ongoing infection. He will need to undergo surveillance. I am not sure the another PPM is indicated unless he develops symptomatic bradycardia.

## 2013-12-05 NOTE — Progress Notes (Signed)
HPI Mr. Hafen returns today for followup. 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 He PPM leads were extracted although a small one-two cm portion of the tip embedded in the RV did not come out. He has almost finished a long course of IV anti-biotics. He denies fever or chills. He was not pacemaker dependent, and did not have a temporary pacing lead placed. He has been stable.  Allergies  Allergen Reactions  . Lisinopril Other (See Comments)    REACTION:  Elevated potassium,renal insuf     Current Outpatient Prescriptions  Medication Sig Dispense Refill  . acetaminophen (TYLENOL) 325 MG tablet Take 2 tablets (650 mg total) by mouth every 6 (six) hours as needed for mild pain or fever.    Marland Kitchen ampicillin 2 g in sodium chloride 0.9 % 50 mL Inject 2 g into the vein every 6 (six) hours. Through 12/07/13, then stop    . aspirin EC 81 MG tablet Take 81 mg by mouth daily.    . B Complex-C (B-COMPLEX WITH VITAMIN C) tablet Take 1 tablet by mouth daily.    . carvedilol (COREG) 6.25 MG tablet Take 6.25 mg by mouth 2 (two) times daily with a meal.    . cefTRIAXone 2 g in dextrose 5 % 50 mL Inject 2 g into the vein every 12 (twelve) hours. Through 12/07/13, then stop    . docusate sodium 100 MG CAPS Take 100 mg by mouth 2 (two) times daily. 10 capsule 0  . ezetimibe (ZETIA) 10 MG tablet Take 10 mg by mouth daily.    . ferrous sulfate 325 (65 FE) MG tablet Take 1 tablet (325 mg total) by mouth daily with breakfast.  3  . furosemide (LASIX) 40 MG tablet Take 40 mg by mouth daily.    Marland Kitchen glucose 4 GM chewable tablet Chew 4 tablets (16 g total) by mouth as needed for low blood sugar. (Patient taking differently: Chew 16 g by mouth daily as needed for low blood sugar. ) 50 tablet 12  . hydrALAZINE (APRESOLINE) 10 MG tablet Take 1 tablet (10 mg total) by mouth every 8 (eight) hours.     . insulin aspart (NOVOLOG) 100 UNIT/ML injection Inject 3 Units into the skin 3 (three) times daily before meals. For CBG greater than 150 for DM    . insulin glargine (LANTUS) 100 UNIT/ML injection Inject 0.1 mLs (10 Units total) into the skin at bedtime. 10 mL 11  . isosorbide mononitrate (IMDUR) 60 MG 24 hr tablet Take 60 mg by mouth daily.    Marland Kitchen linagliptin (TRADJENTA) 5 MG TABS tablet Take 5 mg by mouth daily.    . Omega-3 Fatty Acids (FISH OIL) 1000 MG CAPS Take 1,000 mg by mouth daily.     Marland Kitchen omeprazole (PRILOSEC) 40 MG capsule Take 40 mg by mouth daily.    . ondansetron (ZOFRAN) 4 MG tablet Take 1 tablet (4 mg total) by mouth every 6 (six) hours as needed for nausea. 20 tablet 0  . vitamin B-12 (CYANOCOBALAMIN) 1000 MCG tablet Take 1,000 mcg by mouth daily.     No current facility-administered medications for this visit.     Past Medical History  Diagnosis Date  . Coronary artery disease     Status post CABG in 2004  . Peripheral vascular disease     Status post  right femoropopliteal bypass  . Dyslipidemia   . Diabetes mellitus with peripheral artery disease   . Hypertension   . Chronic renal insufficiency     creatinine around 2.4  . CHF (congestive heart failure) 2007    ef 38%  . History of CVA (cerebrovascular accident)   . Cardiomyopathy   . COPD (chronic obstructive pulmonary disease)   . History of anemia   . Myocardial infarction   . Shortness of breath   . Pacemaker   . Pneumonia   . Anginal pain   . Dementia   . GERD (gastroesophageal reflux disease)   . Arthritis   . Anemia 10/29/2013    ROS:   All systems reviewed and negative except as noted in the HPI.   Past Surgical History  Procedure Laterality Date  . Insert / replace / remove pacemaker  05/17/2005    st. jude dual chamber ddd mode   . Coronary artery bypass graft  4 of 2004    4 vessel bypass  . Femoral bypass  july 2006    right with right great toe amputation  . Cardiac catheterization   04/29/2002    Dr. Glade Lloyd  . Esophagogastroduodenoscopy  07/21/2011    Procedure: ESOPHAGOGASTRODUODENOSCOPY (EGD);  Surgeon: Beryle Beams, MD;  Location: [facility];  Service: Endoscopy;  Laterality: N/A;  . Coronary angioplasty    . Femoral-popliteal bypass graft Left 05/01/2013    Procedure: LEFT FEMORAL TO ABOVE KNEE POPLITEAL ARTERY BYPASS GRAFT WITH INTRAOPERATIVE ARTEROGRAM;  Surgeon: Mal Misty, MD;  Location: [place];  Service: Vascular;  Laterality: Left;  . Amputation Left 05/05/2013    Procedure: AMPUTATION RAY- FIRST AND SECOND-LEFT FOOT;  Surgeon: Newt Minion, MD;  Location: [place];  Service: Orthopedics;  Laterality: Left;  . Tee without cardioversion N/A 10/23/2013    Procedure: TRANSESOPHAGEAL ECHOCARDIOGRAM (TEE);  Surgeon: Dorothy Spark, MD;  Location: [facility];  Service: Cardiovascular;  Laterality: N/A;  . Tee without cardioversion N/A 10/28/2013    Procedure: TRANSESOPHAGEAL ECHOCARDIOGRAM (TEE);  Surgeon: Dorothy Spark, MD;  Location: [facility];  Service: Cardiovascular;  Laterality: N/A;  . Pacemaker lead removal Right 10/27/2013    Procedure: PACEMAKER LEAD REMOVAL;  Surgeon: Evans Lance, MD;  Location: [place];  Service: Cardiovascular;  Laterality: Right;  Roxy Manns backing up     Family History  Problem Relation Age of Onset  . Hyperlipidemia Mother   . Heart disease Father   . Diabetes    . Diabetes Sister   . Hypertension Sister   . Hyperlipidemia Sister      History   Social History  . Marital Status: Married    Spouse Name: N/A    Number of Children: N/A  . Years of Education: N/A   Occupational History  . Not on file.   Social History Main Topics  . Smoking status: Former Smoker -- 1.00 packs/day for 50 years    Types: Cigarettes    Quit date: 06/09/2002  . Smokeless tobacco: Never Used  . Alcohol Use: No  . Drug Use: No  . Sexual Activity: Not Currently   Other Topics Concern  . Not on file   Social History Narrative    Lives in a house.  Lives with his wive.  Drives.  Uses a cane for ambulation.       BP 140/70 mmHg  Pulse 59  Ht 5\' 11"  (1.803 m)  Wt 169 lb 12.8 oz (77.021 kg)  BMI 23.69  kg/m2  Physical Exam:  Well appearing 81 yo man, NAD HEENT: Unremarkable Neck:  No JVD, no thyromegally Back:  No CVA tenderness Lungs:  Clear with well healed incision in the right chest HEART:  Regular rate rhythm, no murmurs, no rubs, no clicks Abd:  soft, positive bowel sounds, no organomegally, no rebound, no guarding Ext:  2 plus pulses, no edema, no cyanosis, no clubbing Skin:  No rashes no nodules Neuro:  CN II through XII intact, motor grossly intact    Assess/Plan:

## 2013-12-05 NOTE — Patient Instructions (Signed)
Your physician recommends that you schedule a follow-up appointment in: 2 months with Dr. Lovena Le

## 2013-12-08 ENCOUNTER — Ambulatory Visit (HOSPITAL_COMMUNITY)
Admission: RE | Admit: 2013-12-08 | Discharge: 2013-12-08 | Disposition: A | Discharge status: Home / Self Care | Payer: Medicare Other | Source: Ambulatory Visit | Attending: Internal Medicine | Admitting: Internal Medicine

## 2013-12-08 DIAGNOSIS — T827XXD Infection and inflammatory reaction due to other cardiac and vascular devices, implants and grafts, subsequent encounter: Secondary | ICD-10-CM | POA: Diagnosis not present

## 2013-12-08 DIAGNOSIS — Y838 Other surgical procedures as the cause of abnormal reaction of the patient, or of later complication, without mention of misadventure at the time of the procedure: Secondary | ICD-10-CM | POA: Insufficient documentation

## 2013-12-08 DIAGNOSIS — Y9289 Other specified places as the place of occurrence of the external cause: Secondary | ICD-10-CM | POA: Insufficient documentation

## 2013-12-08 MED ORDER — LIDOCAINE HCL 1 % IJ SOLN
INTRAMUSCULAR | Status: AC
Start: 2013-12-08 — End: 2013-12-09
  Filled 2013-12-08: qty 20

## 2013-12-08 MED ORDER — CHLORHEXIDINE GLUCONATE 4 % EX LIQD
CUTANEOUS | Status: AC
  Filled 2013-12-08: qty 15

## 2013-12-08 NOTE — Procedures (Signed)
Successful removal of tunneled (L)IJ CVL No complications,  Ascencion Dike PA-C Interventional Radiology 12/08/2013 12:55 PM

## 2013-12-16 DIAGNOSIS — N183 Chronic kidney disease, stage 3 (moderate): Secondary | ICD-10-CM

## 2013-12-16 DIAGNOSIS — I129 Hypertensive chronic kidney disease with stage 1 through stage 4 chronic kidney disease, or unspecified chronic kidney disease: Secondary | ICD-10-CM

## 2013-12-16 DIAGNOSIS — Z48812 Encounter for surgical aftercare following surgery on the circulatory system: Secondary | ICD-10-CM

## 2013-12-16 DIAGNOSIS — E1165 Type 2 diabetes mellitus with hyperglycemia: Secondary | ICD-10-CM

## 2013-12-16 DIAGNOSIS — R261 Paralytic gait: Secondary | ICD-10-CM

## 2013-12-16 DIAGNOSIS — E1122 Type 2 diabetes mellitus with diabetic chronic kidney disease: Secondary | ICD-10-CM

## 2013-12-17 ENCOUNTER — Encounter: Payer: Medicare Other | Admitting: Internal Medicine

## 2013-12-17 ENCOUNTER — Telehealth: Payer: Self-pay | Admitting: Internal Medicine

## 2013-12-17 NOTE — Telephone Encounter (Signed)
LM w/Colin [name] informing her that pt's wound was well healed on 11-13 when seen by the [facility] and so at this point a shower should be fine. I encouraged Izora Gala to call back with further questions.

## 2013-12-17 NOTE — Telephone Encounter (Signed)
New Message        Is pt allowed to shower or does the area around pace maker have to stay dry? Please call back and advise.

## 2013-12-29 ENCOUNTER — Encounter: Payer: Self-pay | Admitting: Family

## 2013-12-30 ENCOUNTER — Encounter: Payer: Self-pay | Admitting: Family

## 2013-12-30 ENCOUNTER — Ambulatory Visit (INDEPENDENT_AMBULATORY_CARE_PROVIDER_SITE_OTHER)
Admission: RE | Admit: 2013-12-30 | Discharge: 2013-12-30 | Disposition: A | Discharge status: Home / Self Care | Payer: Medicare Other | Source: Ambulatory Visit | Attending: Family | Admitting: Family

## 2013-12-30 ENCOUNTER — Ambulatory Visit (INDEPENDENT_AMBULATORY_CARE_PROVIDER_SITE_OTHER): Payer: Medicare Other | Admitting: Family

## 2013-12-30 ENCOUNTER — Ambulatory Visit (HOSPITAL_COMMUNITY)
Admission: RE | Admit: 2013-12-30 | Discharge: 2013-12-30 | Disposition: A | Discharge status: Home / Self Care | Payer: Medicare Other | Source: Ambulatory Visit | Attending: Family | Admitting: Family

## 2013-12-30 VITALS — BP 140/62 | HR 50 | Resp 14 | Ht 71.0 in | Wt 163.0 lb

## 2013-12-30 DIAGNOSIS — I739 Peripheral vascular disease, unspecified: Secondary | ICD-10-CM

## 2013-12-30 DIAGNOSIS — I6523 Occlusion and stenosis of bilateral carotid arteries: Secondary | ICD-10-CM | POA: Insufficient documentation

## 2013-12-30 DIAGNOSIS — I70263 Atherosclerosis of native arteries of extremities with gangrene, bilateral legs: Secondary | ICD-10-CM | POA: Diagnosis not present

## 2013-12-30 DIAGNOSIS — I70269 Atherosclerosis of native arteries of extremities with gangrene, unspecified extremity: Secondary | ICD-10-CM

## 2013-12-30 NOTE — Progress Notes (Signed)
[facility] [place] HISTORY AND PHYSICAL   MRN : [id number]  History of Present Illness:   Colin [name] is a 80 y.o. male patient of Dr. Kellie Simmering who is s/p left fem to above knee popliteal artery bypass graft using 6 mm gortex propaten on 05/01/2013. During the same hospitalization (05/05/13) Dr. Sharol Given amputated left 1st and second toes. He returns today for follow up of his PAD, LE bypass graft, and carotid arteries.   He has returned to home from rehab at [facility] and rehab center.  He has a history of a failed right femoropopliteal bypass graft and right transmetatarsal amputation in 2006.  The patient denies claudication symptoms in either LE with walking; he does state occasional tingling in both feet, denies non healing wounds. He denies any history of stroke or TIA. Pt denies any dizziness either before or after his pacemaker was removed.   Pt Diabetic: Yes, states in fairly good control  Pt smoker: former smoker, quit 2004 when he had his CABG   Pt meds include:  Statin :No, takes ezetimibe  Betablocker: Yes  ASA: Yes  Other anticoagulants/antiplatelets: no     Current Outpatient Prescriptions  Medication Sig Dispense Refill  . acetaminophen (TYLENOL) 325 MG tablet Take 2 tablets (650 mg total) by mouth every 6 (six) hours as needed for mild pain or fever.    Marland Kitchen ampicillin 2 g in sodium chloride 0.9 % 50 mL Inject 2 g into the vein every 6 (six) hours. Through 12/07/13, then stop    . aspirin EC 81 MG tablet Take 81 mg by mouth daily.    . B Complex-C (B-COMPLEX WITH VITAMIN C) tablet Take 1 tablet by mouth daily.    . carvedilol (COREG) 6.25 MG tablet Take 6.25 mg by mouth 2 (two) times daily with a meal.    . cefTRIAXone 2 g in dextrose 5 % 50 mL Inject 2 g into the vein every 12 (twelve) hours. Through 12/07/13, then stop    . docusate sodium 100 MG CAPS Take 100 mg by mouth 2 (two) times daily. 10 capsule 0  . ezetimibe (ZETIA) 10 MG  tablet Take 10 mg by mouth daily.    . ferrous sulfate 325 (65 FE) MG tablet Take 1 tablet (325 mg total) by mouth daily with breakfast.  3  . furosemide (LASIX) 40 MG tablet Take 40 mg by mouth daily.    Marland Kitchen glucose 4 GM chewable tablet Chew 4 tablets (16 g total) by mouth as needed for low blood sugar. (Patient taking differently: Chew 16 g by mouth daily as needed for low blood sugar. ) 50 tablet 12  . hydrALAZINE (APRESOLINE) 10 MG tablet Take 1 tablet (10 mg total) by mouth every 8 (eight) hours.    . insulin aspart (NOVOLOG) 100 UNIT/ML injection Inject 3 Units into the skin 3 (three) times daily before meals. For CBG greater than 150 for DM    . insulin glargine (LANTUS) 100 UNIT/ML injection Inject 0.1 mLs (10 Units total) into the skin at bedtime. 10 mL 11  . isosorbide mononitrate (IMDUR) 60 MG 24 hr tablet Take 60 mg by mouth daily.    Marland Kitchen linagliptin (TRADJENTA) 5 MG TABS tablet Take 5 mg by mouth daily.    . Omega-3 Fatty Acids (FISH OIL) 1000 MG CAPS Take 1,000 mg by mouth daily.     Marland Kitchen omeprazole (PRILOSEC) 40 MG capsule Take 40 mg by mouth daily.    Marland Kitchen  ondansetron (ZOFRAN) 4 MG tablet Take 1 tablet (4 mg total) by mouth every 6 (six) hours as needed for nausea. 20 tablet 0  . vitamin B-12 (CYANOCOBALAMIN) 1000 MCG tablet Take 1,000 mcg by mouth daily.     No current facility-administered medications for this visit.    Past Medical History  Diagnosis Date  . Coronary artery disease     Status post CABG in 2004  . Peripheral vascular disease     Status post right femoropopliteal bypass  . Dyslipidemia   . Diabetes mellitus with peripheral artery disease   . Hypertension   . Chronic renal insufficiency     creatinine around 2.4  . CHF (congestive heart failure) 2007    ef 38%  . History of CVA (cerebrovascular accident)   . Cardiomyopathy   . COPD (chronic obstructive pulmonary disease)   . History of anemia   . Myocardial infarction   . Shortness of breath   . Pacemaker    . Pneumonia   . Anginal pain   . Dementia   . GERD (gastroesophageal reflux disease)   . Arthritis   . Anemia 10/29/2013    Social History History  Substance Use Topics  . Smoking status: Former Smoker -- 1.00 packs/day for 50 years    Types: Cigarettes    Quit date: 06/09/2002  . Smokeless tobacco: Never Used  . Alcohol Use: No    Family History Family History  Problem Relation Age of Onset  . Hyperlipidemia Mother   . Heart disease Father   . Diabetes    . Diabetes Sister   . Hypertension Sister   . Hyperlipidemia Sister     Surgical History Past Surgical History  Procedure Laterality Date  . Insert / replace / remove pacemaker  05/17/2005    [facility] dual chamber ddd mode   . Coronary artery bypass graft  4 of 2004    4 vessel bypass  . Femoral bypass  july 2006    right with right great toe amputation  . Cardiac catheterization  04/29/2002    Dr. Glade Lloyd  . Esophagogastroduodenoscopy  07/21/2011    Procedure: ESOPHAGOGASTRODUODENOSCOPY (EGD);  Surgeon: Beryle Beams, MD;  Location: [facility];  Service: Endoscopy;  Laterality: N/A;  . Coronary angioplasty    . Femoral-popliteal bypass graft Left 05/01/2013    Procedure: LEFT FEMORAL TO ABOVE KNEE POPLITEAL ARTERY BYPASS GRAFT WITH INTRAOPERATIVE ARTEROGRAM;  Surgeon: Mal Misty, MD;  Location: [place];  Service: Vascular;  Laterality: Left;  . Amputation Left 05/05/2013    Procedure: AMPUTATION RAY- FIRST AND SECOND-LEFT FOOT;  Surgeon: Newt Minion, MD;  Location: [facility];  Service: Orthopedics;  Laterality: Left;  . Tee without cardioversion N/A 10/23/2013    Procedure: TRANSESOPHAGEAL ECHOCARDIOGRAM (TEE);  Surgeon: Dorothy Spark, MD;  Location[facility] END[facility]COPY;  Service: Cardiovascular;  Laterality: N/A;  . Tee without cardioversion N/A 10/28/2013    Procedure: TRANSESOPHAGEAL ECHOCARDIOGRAM (TEE);  Surgeon: Dorothy Spark, MD;  Location: [facility] ENDOSCOPY;  Service: Cardiovascular;  Laterality: N/A;  .  Pacemaker lead removal Right 10/27/2013    Procedure: PACEMAKER LEAD REMOVAL;  Surgeon: Evans Lance, MD;  Location: [place];  Service: Cardiovascular;  Laterality: Right;  Roxy Manns backing up    Allergies  Allergen Reactions  . Lisinopril Other (See Comments)    REACTION:  Elevated potassium,renal insuf    Current Outpatient Prescriptions  Medication Sig Dispense Refill  . acetaminophen (TYLENOL) 325 MG tablet Take 2 tablets (  650 mg total) by mouth every 6 (six) hours as needed for mild pain or fever.    Marland Kitchen ampicillin 2 g in sodium chloride 0.9 % 50 mL Inject 2 g into the vein every 6 (six) hours. Through 12/07/13, then stop    . aspirin EC 81 MG tablet Take 81 mg by mouth daily.    . B Complex-C (B-COMPLEX WITH VITAMIN C) tablet Take 1 tablet by mouth daily.    . carvedilol (COREG) 6.25 MG tablet Take 6.25 mg by mouth 2 (two) times daily with a meal.    . cefTRIAXone 2 g in dextrose 5 % 50 mL Inject 2 g into the vein every 12 (twelve) hours. Through 12/07/13, then stop    . docusate sodium 100 MG CAPS Take 100 mg by mouth 2 (two) times daily. 10 capsule 0  . ezetimibe (ZETIA) 10 MG tablet Take 10 mg by mouth daily.    . ferrous sulfate 325 (65 FE) MG tablet Take 1 tablet (325 mg total) by mouth daily with breakfast.  3  . furosemide (LASIX) 40 MG tablet Take 40 mg by mouth daily.    Marland Kitchen glucose 4 GM chewable tablet Chew 4 tablets (16 g total) by mouth as needed for low blood sugar. (Patient taking differently: Chew 16 g by mouth daily as needed for low blood sugar. ) 50 tablet 12  . hydrALAZINE (APRESOLINE) 10 MG tablet Take 1 tablet (10 mg total) by mouth every 8 (eight) hours.    . insulin aspart (NOVOLOG) 100 UNIT/ML injection Inject 3 Units into the skin 3 (three) times daily before meals. For CBG greater than 150 for DM    . insulin glargine (LANTUS) 100 UNIT/ML injection Inject 0.1 mLs (10 Units total) into the skin at bedtime. 10 mL 11  . isosorbide mononitrate (IMDUR) 60 MG 24 hr tablet  Take 60 mg by mouth daily.    Marland Kitchen linagliptin (TRADJENTA) 5 MG TABS tablet Take 5 mg by mouth daily.    . Omega-3 Fatty Acids (FISH OIL) 1000 MG CAPS Take 1,000 mg by mouth daily.     Marland Kitchen omeprazole (PRILOSEC) 40 MG capsule Take 40 mg by mouth daily.    . ondansetron (ZOFRAN) 4 MG tablet Take 1 tablet (4 mg total) by mouth every 6 (six) hours as needed for nausea. 20 tablet 0  . vitamin B-12 (CYANOCOBALAMIN) 1000 MCG tablet Take 1,000 mcg by mouth daily.     No current facility-administered medications for this visit.     REVIEW OF SYSTEMS: See HPI for pertinent positives and negatives.  Physical Examination Filed Vitals:   12/30/13 1113  BP: 140/62  Pulse: 50  Resp: 14  Height: 5\' 11"  (1.803 m)  Weight: 163 lb (73.936 kg)  SpO2: 100%   Body mass index is 22.74 kg/(m^2).  General: A&O x 3, WDWN. Gait: slow and deliberate, using cane Eyes: No gross abnormalities Pulmonary: CTAB, without wheezes , rales or rhonchi. Cardiac: regular Rhythm with bigeminal pattern, dropped third beat , with murmur.     Carotid Bruits Left Right   Negative positive  Aorta is not palpable. Radial pulses: 1+ palpable and =.  VASCULAR EXAM:  Extremities without ischemic changes. Left 1st and second toes are surgically absent. Right foot is well healed s/p right transmetatarsal amputation.  LE Pulses  LEFT  RIGHT   FEMORAL  palpable  palpable   POPLITEAL  not palpable  not palpable   POSTERIOR TIBIAL  not palpable  not  palpable   DORSALIS PEDIS  ANTERIOR TIBIAL  not palpable  not palpable   Abdomen: soft, NT, no palpable masses.  Skin: see extremities.  Musculoskeletal: see extremities. Neurologic: A&O X 3; Appropriate Affect ; SENSATION: normal; MOTOR FUNCTION: moving all extremities equally. Speech is fluent/normal. CN 2-12 grossly intact.    Non-Invasive Vascular Imaging (12/30/2013):  CEREBROVASCULAR DUPLEX EVALUATION    INDICATION: Carotid artery disease      PREVIOUS INTERVENTION(S):     DUPLEX EXAM:     RIGHT  LEFT  Peak Systolic Velocities (cm/s) End Diastolic Velocities (cm/s) Plaque LOCATION Peak Systolic Velocities (cm/s) End Diastolic Velocities (cm/s) Plaque  72 9  CCA PROXIMAL 70 8   53 8 HT CCA MID 83 7 HT  61 9 HT CCA DISTAL 72 14 HT  136 10 HT ECA 120 11 HT  163 50 HT ICA PROXIMAL 74 14 HT  154 27 TH ICA MID 95 22 HT  60 12  ICA DISTAL 56 13     3.07 ICA / CCA Ratio (PSV) 1.14  Antegrade  Vertebral Flow Antegrade   123XX123 Brachial Systolic Pressure (mmHg) 0000000  Triphasic  Brachial Artery Waveforms Triphasic     Plaque Morphology:  HM = Homogeneous, HT = Heterogeneous, CP = Calcific Plaque, SP = Smooth Plaque, IP = Irregular Plaque     ADDITIONAL FINDINGS:     IMPRESSION: Right internal carotid artery velocities suggest a 40-59% stenosis. Left internal carotid artery velocities suggest a <40% stenosis.      Compared to the previous exam:  No prior exam performed at this facility for comparison.     ABI (Date: 12/30/2013)  R: 0.33 (04/29/13, 0.55), monophasic, DP: 0.33, PT: 0.31,monophasic  L: 0.83 (0.52), DP: 0.82, biphasic, PT: 0.83, biphasic   ASSESSMENT:  Colin [name] is a 80 y.o. male who is s/p left fem to above knee popliteal artery bypass graft using 6 mm gortex propaten on 05/01/2013. During the same hospitalization Dr. Sharol Given amputated left 1st and second toes. He has a history of a failed right femoropopliteal bypass graft and right transmetatarsal amputation in 2006.   Pt is receiving home physical therapy twice/week and does exercises himself the other days.   Left ABI improved to evidence of mild arterial occlusive disease from severe since the left fem to above knee popliteal artery bypass graft on 05/01/2013.  Right ABI has worsened to evidence of critical limb ischemia from evidence of severe arterial occlusive disease. However, he has no tissue loss, denies claudication in legs with walking, has no rest  pain. See Plan.   PLAN:   Based on today's exam and non-invasive vascular lab results, and after discussing with Dr. Donnetta Hutching,  the patient will follow up in 6 months with the following tests: ABI's; carotid Duplex in a year. I discussed in depth with the patient the nature of atherosclerosis, and emphasized the importance of maximal medical management including strict control of blood pressure, blood glucose, and lipid levels, obtaining regular exercise, and continued cessation of smoking.  The patient is aware that without maximal medical management the underlying atherosclerotic disease process will progress, limiting the benefit of any interventions.  The patient was given information about stroke prevention and what symptoms should prompt the patient to seek immediate medical care.  The patient was given information about PAD including signs, symptoms, treatment, what symptoms should prompt the patient to seek immediate medical care, and risk reduction measures to take. Thank you for allowing [place] to  participate in this patient's care.  Clemon Chambers, RN, MSN, FNP-C [facility] Office: [phone]  Clinic MD: Early 12/30/2013 11:11 AM

## 2013-12-30 NOTE — Addendum Note (Signed)
Addended by: Mena Goes on: 12/30/2013 02:53 PM   Modules accepted: Orders

## 2013-12-30 NOTE — Patient Instructions (Addendum)
Peripheral Vascular Disease Peripheral Vascular Disease (PVD), also called Peripheral Arterial Disease (PAD), is a circulation problem caused by cholesterol (atherosclerotic plaque) deposits in the arteries. PVD commonly occurs in the lower extremities (legs) but it can occur in other areas of the body, such as your arms. The cholesterol buildup in the arteries reduces blood flow which can cause pain and other serious problems. The presence of PVD can place a person at risk for Coronary Artery Disease (CAD).  CAUSES  Causes of PVD can be many. It is usually associated with more than one risk factor such as:   High Cholesterol.  Smoking.  Diabetes.  Lack of exercise or inactivity.  High blood pressure (hypertension).  Obesity.  Family history. SYMPTOMS   When the lower extremities are affected, patients with PVD may experience:  Leg pain with exertion or physical activity. This is called INTERMITTENT CLAUDICATION. This may present as cramping or numbness with physical activity. The location of the pain is associated with the level of blockage. For example, blockage at the abdominal level (distal abdominal aorta) may result in buttock or hip pain. Lower leg arterial blockage may result in calf pain.  As PVD becomes more severe, pain can develop with less physical activity.  In people with severe PVD, leg pain may occur at rest.  Other PVD signs and symptoms:  Leg numbness or weakness.  Coldness in the affected leg or foot, especially when compared to the other leg.  A change in leg color.  Patients with significant PVD are more prone to ulcers or sores on toes, feet or legs. These may take longer to heal or may reoccur. The ulcers or sores can become infected.  If signs and symptoms of PVD are ignored, gangrene may occur. This can result in the loss of toes or loss of an entire limb.  Not all leg pain is related to PVD. Other medical conditions can cause leg pain such  as:  Blood clots (embolism) or Deep Vein Thrombosis.  Inflammation of the blood vessels (vasculitis).  Spinal stenosis. DIAGNOSIS  Diagnosis of PVD can involve several different types of tests. These can include:  Pulse Volume Recording Method (PVR). This test is simple, painless and does not involve the use of X-rays. PVR involves measuring and comparing the blood pressure in the arms and legs. An ABI (Ankle-Brachial Index) is calculated. The normal ratio of blood pressures is 1. As this number becomes smaller, it indicates more severe disease.  < 0.95 - indicates significant narrowing in one or more leg vessels.  <0.8 - there will usually be pain in the foot, leg or buttock with exercise.  <0.4 - will usually have pain in the legs at rest.  <0.25 - usually indicates limb threatening PVD.  Doppler detection of pulses in the legs. This test is painless and checks to see if you have a pulses in your legs/feet.  A dye or contrast material (a substance that highlights the blood vessels so they show up on x-ray) may be given to help your caregiver better see the arteries for the following tests. The dye is eliminated from your body by the kidney's. Your caregiver may order blood work to check your kidney function and other laboratory values before the following tests are performed:  Magnetic Resonance Angiography (MRA). An MRA is a picture study of the blood vessels and arteries. The MRA machine uses a large magnet to produce images of the blood vessels.  Computed Tomography Angiography (CTA). A CTA   is a specialized x-ray that looks at how the blood flows in your blood vessels. An IV may be inserted into your arm so contrast dye can be injected.  Angiogram. Is a procedure that uses x-rays to look at your blood vessels. This procedure is minimally invasive, meaning a small incision (cut) is made in your groin. A small tube (catheter) is then inserted into the artery of your groin. The catheter  is guided to the blood vessel or artery your caregiver wants to examine. Contrast dye is injected into the catheter. X-rays are then taken of the blood vessel or artery. After the images are obtained, the catheter is taken out. TREATMENT  Treatment of PVD involves many interventions which may include:  Lifestyle changes:  Quitting smoking.  Exercise.  Following a low fat, low cholesterol diet.  Control of diabetes.  Foot care is very important to the PVD patient. Good foot care can help prevent infection.  Medication:  Cholesterol-lowering medicine.  Blood pressure medicine.  Anti-platelet drugs.  Certain medicines may reduce symptoms of Intermittent Claudication.  Interventional/Surgical options:  Angioplasty. An Angioplasty is a procedure that inflates a balloon in the blocked artery. This opens the blocked artery to improve blood flow.  Stent Implant. A wire mesh tube (stent) is placed in the artery. The stent expands and stays in place, allowing the artery to remain open.  Peripheral Bypass Surgery. This is a surgical procedure that reroutes the blood around a blocked artery to help improve blood flow. This type of procedure may be performed if Angioplasty or stent implants are not an option. SEEK IMMEDIATE MEDICAL CARE IF:   You develop pain or numbness in your arms or legs.  Your arm or leg turns cold, becomes blue in color.  You develop redness, warmth, swelling and pain in your arms or legs. MAKE SURE YOU:   Understand these instructions.  Will watch your condition.  Will get help right away if you are not doing well or get worse. Document Released: 02/17/2004 Document Revised: 04/03/2011 Document Reviewed: 01/14/2008 ExitCare Patient Information 2015 [employer]. This information is not intended to replace advice given to you by your health care provider. Make sure you discuss any questions you have with your health care provider.   Stroke  Prevention Some medical conditions and behaviors are associated with an increased chance of having a stroke. You may prevent a stroke by making healthy choices and managing medical conditions. HOW CAN I REDUCE MY RISK OF HAVING A STROKE?   Stay physically active. Get at least 30 minutes of activity on most or all days.  Do not smoke. It may also be helpful to avoid exposure to secondhand smoke.  Limit alcohol use. Moderate alcohol use is considered to be:  No more than 2 drinks per day for men.  No more than 1 drink per day for nonpregnant women.  Eat healthy foods. This involves:  Eating 5 or more servings of fruits and vegetables a day.  Making dietary changes that address high blood pressure (hypertension), high cholesterol, diabetes, or obesity.  Manage your cholesterol levels.  Making food choices that are high in fiber and low in saturated fat, trans fat, and cholesterol may control cholesterol levels.  Take any prescribed medicines to control cholesterol as directed by your health care provider.  Manage your diabetes.  Controlling your carbohydrate and sugar intake is recommended to manage diabetes.  Take any prescribed medicines to control diabetes as directed by your health care provider.    Control your hypertension.  Making food choices that are low in salt (sodium), saturated fat, trans fat, and cholesterol is recommended to manage hypertension.  Take any prescribed medicines to control hypertension as directed by your health care provider.  Maintain a healthy weight.  Reducing calorie intake and making food choices that are low in sodium, saturated fat, trans fat, and cholesterol are recommended to manage weight.  Stop drug abuse.  Avoid taking birth control pills.  Talk to your health care provider about the risks of taking birth control pills if you are over 35 years old, smoke, get migraines, or have ever had a blood clot.  Get evaluated for sleep  disorders (sleep apnea).  Talk to your health care provider about getting a sleep evaluation if you snore a lot or have excessive sleepiness.  Take medicines only as directed by your health care provider.  For some people, aspirin or blood thinners (anticoagulants) are helpful in reducing the risk of forming abnormal blood clots that can lead to stroke. If you have the irregular heart rhythm of atrial fibrillation, you should be on a blood thinner unless there is a good reason you cannot take them.  Understand all your medicine instructions.  Make sure that other conditions (such as anemia or atherosclerosis) are addressed. SEEK IMMEDIATE MEDICAL CARE IF:   You have sudden weakness or numbness of the face, arm, or leg, especially on one side of the body.  Your face or eyelid droops to one side.  You have sudden confusion.  You have trouble speaking (aphasia) or understanding.  You have sudden trouble seeing in one or both eyes.  You have sudden trouble walking.  You have dizziness.  You have a loss of balance or coordination.  You have a sudden, severe headache with no known cause.  You have new chest pain or an irregular heartbeat. Any of these symptoms may represent a serious problem that is an emergency. Do not wait to see if the symptoms will go away. Get medical help at once. Call your local emergency services (911 in [place] Do not drive yourself to the hospital. Document Released: 02/17/2004 Document Revised: 05/26/2013 Document Reviewed: 07/12/2012 ExitCare Patient Information 2015 [employer]. This information is not intended to replace advice given to you by your health care provider. Make sure you discuss any questions you have with your health care provider.  

## 2014-01-01 ENCOUNTER — Other Ambulatory Visit: Payer: Self-pay | Admitting: Cardiovascular Disease

## 2014-01-01 ENCOUNTER — Encounter (HOSPITAL_COMMUNITY): Payer: Self-pay | Admitting: Vascular Surgery

## 2014-02-04 ENCOUNTER — Ambulatory Visit (INDEPENDENT_AMBULATORY_CARE_PROVIDER_SITE_OTHER): Payer: Medicare Other | Admitting: Internal Medicine

## 2014-02-04 ENCOUNTER — Encounter: Payer: Self-pay | Admitting: Internal Medicine

## 2014-02-04 VITALS — BP 134/50 | HR 50 | Ht 71.0 in | Wt 166.6 lb

## 2014-02-04 DIAGNOSIS — I459 Conduction disorder, unspecified: Secondary | ICD-10-CM

## 2014-02-04 DIAGNOSIS — I5022 Chronic systolic (congestive) heart failure: Secondary | ICD-10-CM

## 2014-02-04 DIAGNOSIS — I2510 Atherosclerotic heart disease of native coronary artery without angina pectoris: Secondary | ICD-10-CM

## 2014-02-04 DIAGNOSIS — I339 Acute and subacute endocarditis, unspecified: Secondary | ICD-10-CM

## 2014-02-04 DIAGNOSIS — R42 Dizziness and giddiness: Secondary | ICD-10-CM

## 2014-02-04 DIAGNOSIS — R001 Bradycardia, unspecified: Secondary | ICD-10-CM | POA: Diagnosis not present

## 2014-02-04 NOTE — Progress Notes (Signed)
HPI Mr. Alzamora returns today for followup. 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 His PPM leads were extracted although a small two cm portion of the tip embedded in the RV did not come out. He has finished a long course of IV anti-biotics. He denies fever or chills. He was not pacemaker dependent, and did not have a temporary pacing lead placed. He has been stable. He notes occaisional dizzy spells but no frank syncope.  Allergies  Allergen Reactions  . Lisinopril Other (See Comments)    REACTION:  Elevated potassium,renal insuf     Current Outpatient Prescriptions  Medication Sig Dispense Refill  . acetaminophen (TYLENOL) 325 MG tablet Take 2 tablets (650 mg total) by mouth every 6 (six) hours as needed for mild pain or fever.    Marland Kitchen aspirin EC 81 MG tablet Take 81 mg by mouth daily.    . B Complex-C (B-COMPLEX WITH VITAMIN C) tablet Take 1 tablet by mouth daily.    . carvedilol (COREG) 6.25 MG tablet Take 1 Tablet by mouth twice a day 60 tablet 1  . docusate sodium 100 MG CAPS Take 100 mg by mouth 2 (two) times daily. 10 capsule 0  . ezetimibe (ZETIA) 10 MG tablet Take 10 mg by mouth daily.    . ferrous sulfate 325 (65 FE) MG tablet Take 1 tablet (325 mg total) by mouth daily with breakfast.  3  . furosemide (LASIX) 40 MG tablet TAKE 1 TABLET BY MOUTH EVERY DAY 30 tablet 0  . glucose 4 GM chewable tablet Chew 4 tablets (16 g total) by mouth as needed for low blood sugar. (Patient taking differently: Chew 16 g by mouth daily as needed for low blood sugar. ) 50 tablet 12  . hydrALAZINE (APRESOLINE) 10 MG tablet Take 1 tablet (10 mg total) by mouth every 8 (eight) hours.    . insulin aspart (NOVOLOG) 100 UNIT/ML injection Inject 3 Units into the skin 3 (three) times daily before meals. For CBG greater than 150 for DM    . insulin glargine (LANTUS) 100 UNIT/ML  injection Inject 0.1 mLs (10 Units total) into the skin at bedtime. 10 mL 11  . isosorbide mononitrate (IMDUR) 30 MG 24 hr tablet TAKE 1 TABLET BY MOUTH EVERY DAY 30 tablet 0  . linagliptin (TRADJENTA) 5 MG TABS tablet Take 5 mg by mouth daily.    . Omega-3 Fatty Acids (FISH OIL) 1000 MG CAPS Take 1,000 mg by mouth daily.     Marland Kitchen omeprazole (PRILOSEC) 40 MG capsule Take 40 mg by mouth daily.    . ondansetron (ZOFRAN) 4 MG tablet Take 1 tablet (4 mg total) by mouth every 6 (six) hours as needed for nausea. 20 tablet 0  . vitamin B-12 (CYANOCOBALAMIN) 1000 MCG tablet Take 1,000 mcg by mouth daily.     No current facility-administered medications for this visit.     Past Medical History  Diagnosis Date  . Coronary artery disease     Status post CABG in 2004  . Peripheral vascular disease     Status post right femoropopliteal bypass  . Dyslipidemia   . Diabetes mellitus with peripheral artery disease   . Hypertension   . Chronic renal insufficiency     creatinine around 2.4  . CHF (congestive heart failure) 2007    ef 38%  .  History of CVA (cerebrovascular accident)   . Cardiomyopathy   . COPD (chronic obstructive pulmonary disease)   . History of anemia   . Myocardial infarction   . Shortness of breath   . Pacemaker   . Pneumonia   . Anginal pain   . Dementia   . GERD (gastroesophageal reflux disease)   . Arthritis   . Anemia 10/29/2013    ROS:   All systems reviewed and negative except as noted in the HPI.   Past Surgical History  Procedure Laterality Date  . Insert / replace / remove pacemaker  05/17/2005    [employer]e dual chamber ddd mode   . Coronary artery bypass graft  4 of 2004    4 vessel bypass  . Femoral bypass  july 2006    right with right great toe amputation  . Cardiac catheterization  04/29/2002    Dr. Glade Lloyd  . Esophagogastroduodenoscopy  07/21/2011    Procedure: ESOPHAGOGASTRODUODENOSCOPY (EGD);  Surgeon: Beryle Beams, MD;  Location: [facility];   Service: Endoscopy;  Laterality: N/A;  . Coronary angioplasty    . Femoral-popliteal bypass graft Left 05/01/2013    Procedure: LEFT FEMORAL TO ABOVE KNEE POPLITEAL ARTERY BYPASS GRAFT WITH INTRAOPERATIVE ARTEROGRAM;  Surgeon: Mal Misty, MD;  Location: [place];  Service: Vascular;  Laterality: Left;  . Amputation Left 05/05/2013    Procedure: AMPUTATION RAY- FIRST AND SECOND-LEFT FOOT;  Surgeon: Newt Minion, MD;  Location: [place];  Service: Orthopedics;  Laterality: Left;  . Tee without cardioversion N/A 10/23/2013    Procedure: TRANSESOPHAGEAL ECHOCARDIOGRAM (TEE);  Surgeon: Dorothy Spark, MD;  Location: [facility] ENDOSCOPY;  Service: Cardiovascular;  Laterality: N/A;  . Tee without cardioversion N/A 10/28/2013    Procedure: TRANSESOPHAGEAL ECHOCARDIOGRAM (TEE);  Surgeon: Dorothy Spark, MD;  Location: [facility] ENDOSCOPY;  Service: Cardiovascular;  Laterality: N/A;  . Pacemaker lead removal Right 10/27/2013    Procedure: PACEMAKER LEAD REMOVAL;  Surgeon: Evans Lance, MD;  Location: [place];  Service: Cardiovascular;  Laterality: Right;  Roxy Manns backing up  . Lower extremity angiogram N/A 04/30/2013    Procedure: LOWER EXTREMITY ANGIOGRAM;  Surgeon: Mal Misty, MD;  Location: [facility] CATH LAB;  Service: Cardiovascular;  Laterality: N/A;  . Abdominal angiogram  04/30/2013    Procedure: ABDOMINAL ANGIOGRAM;  Surgeon: Mal Misty, MD;  Location: [facility] CATH LAB;  Service: Cardiovascular;;     Family History  Problem Relation Age of Onset  . Hyperlipidemia Mother   . Heart disease Father   . Diabetes    . Diabetes Sister   . Hypertension Sister   . Hyperlipidemia Sister      History   Social History  . Marital Status: Married    Spouse Name: N/A    Number of Children: N/A  . Years of Education: N/A   Occupational History  . Not on file.   Social History Main Topics  . Smoking status: Former Smoker -- 1.00 packs/day for 50 years    Types: Cigarettes    Quit date: 06/09/2002  . Smokeless  tobacco: Never Used  . Alcohol Use: No  . Drug Use: No  . Sexual Activity: Not Currently   Other Topics Concern  . Not on file   Social History Narrative   Lives in a house.  Lives with his wive.  Drives.  Uses a cane for ambulation.       BP 134/50 mmHg  Pulse 50  Ht 5\' 11"  (1.803 m)  Wt 166 lb 9.6 oz (75.569 kg)  BMI 23.25 kg/m2  Physical Exam:  Well appearing 81 yo man, NAD HEENT: Unremarkable Neck:  No JVD, no thyromegally Back:  No CVA tenderness Lungs:  Clear with well healed incision in the right chest HEART:  Regular rate rhythm, no murmurs, no rubs, no clicks Abd:  soft, positive bowel sounds, no organomegally, no rebound, no guarding Ext:  2 plus pulses, no edema, no cyanosis, no clubbing Skin:  No rashes no nodules Neuro:  CN II through XII intact, motor grossly intact    Assess/Plan:

## 2014-02-04 NOTE — Assessment & Plan Note (Signed)
His symptoms are class 2a. No change in medical therapy.

## 2014-02-04 NOTE — Patient Instructions (Signed)
Your physician recommends that you schedule a follow-up appointment in: 5 weeks with Dr. Lovena Le   Your physician has recommended that you wear an event monitor. Event monitors are medical devices that record the heart's electrical activity. Doctors most often [place] these monitors to diagnose arrhythmias. Arrhythmias are problems with the speed or rhythm of the heartbeat. The monitor is a small, portable device. You can wear one while you do your normal daily activities. This is usually used to diagnose what is causing palpitations/syncope (passing out).  Your physician recommends that you continue on your current medications as directed. Please refer to the Current Medication list given to you today.

## 2014-02-04 NOTE — Assessment & Plan Note (Signed)
No evidence of active infection. He is off of anti-biotics.

## 2014-02-04 NOTE — Assessment & Plan Note (Signed)
He denies anginal symptoms. Continue current meds. No beta blocker because of history of bradycardia.

## 2014-02-04 NOTE — Assessment & Plan Note (Signed)
He has had no recurrent syncope although he has had some dizzy spells since his ppm was removed. I have discussed the treatment options and I would hope to avoid another PPM unless absolutely necessary. I have recommended he undergo placment of a 3 week cardiac monitor. His dizzy spells occur at least once a week. If no symptomatic brady on monitor,then he will not undergo ppm insertion.

## 2014-02-05 ENCOUNTER — Other Ambulatory Visit: Payer: Self-pay | Admitting: Internal Medicine

## 2014-02-09 ENCOUNTER — Encounter: Payer: Self-pay | Admitting: *Deleted

## 2014-02-09 ENCOUNTER — Encounter (INDEPENDENT_AMBULATORY_CARE_PROVIDER_SITE_OTHER): Payer: Medicare Other

## 2014-02-09 DIAGNOSIS — R42 Dizziness and giddiness: Secondary | ICD-10-CM | POA: Diagnosis not present

## 2014-02-09 DIAGNOSIS — R001 Bradycardia, unspecified: Secondary | ICD-10-CM | POA: Diagnosis not present

## 2014-02-09 NOTE — Progress Notes (Signed)
Patient ID: Colin [name], male   DOB: Aug 25, 1928, 81 y.o.   MRN: [id number] [employer] 30 day cardiac event monitor applied to patient.

## 2014-03-10 DIAGNOSIS — N189 Chronic kidney disease, unspecified: Secondary | ICD-10-CM | POA: Diagnosis not present

## 2014-03-10 DIAGNOSIS — N2581 Secondary hyperparathyroidism of renal origin: Secondary | ICD-10-CM | POA: Diagnosis not present

## 2014-03-10 DIAGNOSIS — N184 Chronic kidney disease, stage 4 (severe): Secondary | ICD-10-CM | POA: Diagnosis not present

## 2014-03-10 DIAGNOSIS — D631 Anemia in chronic kidney disease: Secondary | ICD-10-CM | POA: Diagnosis not present

## 2014-03-11 ENCOUNTER — Ambulatory Visit: Payer: Medicare Other | Admitting: Internal Medicine

## 2014-03-16 ENCOUNTER — Encounter: Payer: Self-pay | Admitting: Internal Medicine

## 2014-03-24 DIAGNOSIS — E1159 Type 2 diabetes mellitus with other circulatory complications: Secondary | ICD-10-CM | POA: Diagnosis not present

## 2014-03-24 DIAGNOSIS — D649 Anemia, unspecified: Secondary | ICD-10-CM | POA: Diagnosis not present

## 2014-03-24 DIAGNOSIS — E780 Pure hypercholesterolemia: Secondary | ICD-10-CM | POA: Diagnosis not present

## 2014-03-24 DIAGNOSIS — N189 Chronic kidney disease, unspecified: Secondary | ICD-10-CM | POA: Diagnosis not present

## 2014-03-24 DIAGNOSIS — N390 Urinary tract infection, site not specified: Secondary | ICD-10-CM | POA: Diagnosis not present

## 2014-03-24 DIAGNOSIS — C61 Malignant neoplasm of prostate: Secondary | ICD-10-CM | POA: Diagnosis not present

## 2014-03-26 DIAGNOSIS — E875 Hyperkalemia: Secondary | ICD-10-CM | POA: Diagnosis not present

## 2014-03-28 ENCOUNTER — Other Ambulatory Visit: Payer: Self-pay | Admitting: Internal Medicine

## 2014-04-14 DIAGNOSIS — Z0000 Encounter for general adult medical examination without abnormal findings: Secondary | ICD-10-CM | POA: Diagnosis not present

## 2014-04-14 DIAGNOSIS — R972 Elevated prostate specific antigen [PSA]: Secondary | ICD-10-CM | POA: Diagnosis not present

## 2014-04-14 DIAGNOSIS — N184 Chronic kidney disease, stage 4 (severe): Secondary | ICD-10-CM | POA: Diagnosis not present

## 2014-04-14 DIAGNOSIS — N390 Urinary tract infection, site not specified: Secondary | ICD-10-CM | POA: Diagnosis not present

## 2014-04-21 ENCOUNTER — Other Ambulatory Visit: Payer: Self-pay | Admitting: Internal Medicine

## 2014-06-11 ENCOUNTER — Encounter: Payer: Self-pay | Admitting: Internal Medicine

## 2014-06-11 ENCOUNTER — Ambulatory Visit (INDEPENDENT_AMBULATORY_CARE_PROVIDER_SITE_OTHER): Payer: Medicare Other | Admitting: Internal Medicine

## 2014-06-11 VITALS — BP 146/50 | HR 44 | Ht 71.0 in | Wt 161.6 lb

## 2014-06-11 DIAGNOSIS — I5022 Chronic systolic (congestive) heart failure: Secondary | ICD-10-CM | POA: Diagnosis not present

## 2014-06-11 NOTE — Progress Notes (Signed)
HPI [name] returns today for followup. 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 His PPM leads were extracted although a small two cm portion of the tip embedded in the RV did not come out. He has finished a long course of IV anti-biotics. He denies fever or chills. He was not pacemaker dependent, and did not have a temporary pacing lead placed. He has been stable. He notes occaisional dizzy spells but no frank syncope. He wore a cardiac monitor for a day, but had no symptomatic bradycardia. He has healed up nicely after his extraction. Allergies  Allergen Reactions  . Lisinopril Other (See Comments)    REACTION:  Elevated potassium,renal insuf     Current Outpatient Prescriptions  Medication Sig Dispense Refill  . acetaminophen (TYLENOL) 325 MG tablet Take 2 tablets (650 mg total) by mouth every 6 (six) hours as needed for mild pain or fever.    Marland Kitchen aspirin EC 81 MG tablet Take 81 mg by mouth daily.    . B Complex-C (B-COMPLEX WITH VITAMIN C) tablet Take 1 tablet by mouth daily.    . carvedilol (COREG) 6.25 MG tablet TAKE 1 TABLET BY MOUTH TWICE A DAY 60 tablet 1  . dextrose 4 G chewable tablet Chew 16 g by mouth daily as needed for low blood sugar.    . docusate sodium 100 MG CAPS Take 100 mg by mouth 2 (two) times daily. 10 capsule 0  . ezetimibe (ZETIA) 10 MG tablet Take 10 mg by mouth daily.    . ferrous sulfate 325 (65 FE) MG tablet Take 1 tablet (325 mg total) by mouth daily with breakfast.  3  . furosemide (LASIX) 40 MG tablet TAKE 1 TABLET BY MOUTH EVERY DAY 30 tablet 0  . hydrALAZINE (APRESOLINE) 10 MG tablet Take 1 tablet (10 mg total) by mouth every 8 (eight) hours.    . insulin aspart (NOVOLOG) 100 UNIT/ML injection Inject 3 Units into the skin 3 (three) times daily before meals. For CBG greater than 150 for DM    . insulin glargine (LANTUS) 100  UNIT/ML injection Inject 0.1 mLs (10 Units total) into the skin at bedtime. 10 mL 11  . linagliptin (TRADJENTA) 5 MG TABS tablet Take 5 mg by mouth daily.    . Omega-3 Fatty Acids (FISH OIL) 1000 MG CAPS Take 1,000 mg by mouth daily.     Marland Kitchen omeprazole (PRILOSEC) 40 MG capsule Take 40 mg by mouth daily.    . ondansetron (ZOFRAN) 4 MG tablet Take 1 tablet (4 mg total) by mouth every 6 (six) hours as needed for nausea. 20 tablet 0  . vitamin B-12 (CYANOCOBALAMIN) 1000 MCG tablet Take 1,000 mcg by mouth daily.    . isosorbide mononitrate (IMDUR) 30 MG 24 hr tablet TAKE 1 TABLET BY MOUTH EVERY DAY 30 tablet 0   No current facility-administered medications for this visit.     Past Medical History  Diagnosis Date  . Coronary artery disease     Status post CABG in 2004  . Peripheral vascular disease     Status post right femoropopliteal bypass  . Dyslipidemia   . Diabetes mellitus with peripheral artery disease   . Hypertension   . Chronic renal insufficiency     creatinine around 2.4  . CHF (congestive heart failure) 2007    ef 38%  .  History of CVA (cerebrovascular accident)   . Cardiomyopathy   . COPD (chronic obstructive pulmonary disease)   . History of anemia   . Myocardial infarction   . Shortness of breath   . Pacemaker   . Pneumonia   . Anginal pain   . Dementia   . GERD (gastroesophageal reflux disease)   . Arthritis   . Anemia 10/29/2013    ROS:   All systems reviewed and negative except as noted in the HPI.   Past Surgical History  Procedure Laterality Date  . Insert / replace / remove pacemaker  05/17/2005    [employer] jude dual chamber ddd mode   . Coronary artery bypass graft  4 of 2004    4 vessel bypass  . Femoral bypass  july 2006    right with right great toe amputation  . Cardiac catheterization  04/29/2002    Dr. Glade Lloyd  . Esophagogastroduodenoscopy  07/21/2011    Procedure: ESOPHAGOGASTRODUODENOSCOPY (EGD);  Surgeon: Beryle Beams, MD;  Location: [facility]  ENDOSCOPY;  Service: Endoscopy;  Laterality: N/A;  . Coronary angioplasty    . Femoral-popliteal bypass graft Left 05/01/2013    Procedure: LEFT FEMORAL TO ABOVE KNEE POPLITEAL ARTERY BYPASS GRAFT WITH INTRAOPERATIVE ARTEROGRAM;  Surgeon: Mal Misty, MD;  Location: [place];  Service: Vascular;  Laterality: Left;  . Amputation Left 05/05/2013    Procedure: AMPUTATION RAY- FIRST AND SECOND-LEFT FOOT;  Surgeon: Newt Minion, MD;  Location: [place];  Service: Orthopedics;  Laterality: Left;  . Tee without cardioversion N/A 10/23/2013    Procedure: TRANSESOPHAGEAL ECHOCARDIOGRAM (TEE);  Surgeon: Dorothy Spark, MD;  Location: [facility] ENDOSCOPY;  Service: Cardiovascular;  Laterality: N/A;  . Tee without cardioversion N/A 10/28/2013    Procedure: TRANSESOPHAGEAL ECHOCARDIOGRAM (TEE);  Surgeon: Dorothy Spark, MD;  Location: [facility] ENDOSCOPY;  Service: Cardiovascular;  Laterality: N/A;  . Pacemaker lead removal Right 10/27/2013    Procedure: PACEMAKER LEAD REMOVAL;  Surgeon: Evans Lance, MD;  Location: [place];  Service: Cardiovascular;  Laterality: Right;  Roxy Manns backing up  . Lower extremity angiogram N/A 04/30/2013    Procedure: LOWER EXTREMITY ANGIOGRAM;  Surgeon: Mal Misty, MD;  Location: [facility] CATH LAB;  Service: Cardiovascular;  Laterality: N/A;  . Abdominal angiogram  04/30/2013    Procedure: ABDOMINAL ANGIOGRAM;  Surgeon: Mal Misty, MD;  Location: [facility] CATH LAB;  Service: Cardiovascular;;     Family History  Problem Relation Age of Onset  . Hyperlipidemia Mother   . Heart disease Father   . Diabetes    . Diabetes Sister   . Hypertension Sister   . Hyperlipidemia Sister      History   Social History  . Marital Status: Married    Spouse Name: N/A  . Number of Children: N/A  . Years of Education: N/A   Occupational History  . Not on file.   Social History Main Topics  . Smoking status: Former Smoker -- 1.00 packs/day for 50 years    Types: Cigarettes    Quit date: 06/09/2002  .  Smokeless tobacco: Never Used  . Alcohol Use: No  . Drug Use: No  . Sexual Activity: Not Currently   Other Topics Concern  . Not on file   Social History Narrative   Lives in a house.  Lives with his wive.  Drives.  Uses a cane for ambulation.       BP 146/50 mmHg  Pulse 44  Ht 5\' 11"  (1.803 m)  Wt 161 lb 9.6 oz (73.301 kg)  BMI 22.55 kg/m2  Physical Exam:  Well appearing 81 yo man, NAD HEENT: Unremarkable Neck:  No JVD, no thyromegally Back:  No CVA tenderness Lungs:  Clear with well healed incision in the right chest HEART:  Regular rate rhythm, no murmurs, no rubs, no clicks Abd:  soft, positive bowel sounds, no organomegally, no rebound, no guarding Ext:  2 plus pulses, no edema, no cyanosis, no clubbing Skin:  No rashes no nodules Neuro:  CN II through XII intact, motor grossly intact    Assess/Plan:

## 2014-06-11 NOTE — Patient Instructions (Signed)
Medication Instructions:  Your physician recommends that you continue on your current medications as directed. Please refer to the Current Medication list given to you today.  Labwork: None ordered  Testing/Procedures: None ordered  Follow-Up: No follow up is needed at this time with Dr. Lovena Le.  He will see you on an as needed basis.   Thank you for choosing [facility]!!

## 2014-06-11 NOTE — Assessment & Plan Note (Signed)
His symptoms are class II. I've recommended he could

## 2014-06-21 ENCOUNTER — Other Ambulatory Visit: Payer: Self-pay | Admitting: Cardiology

## 2014-06-23 NOTE — Telephone Encounter (Signed)
Per note 5.19.16

## 2014-06-25 DIAGNOSIS — E785 Hyperlipidemia, unspecified: Secondary | ICD-10-CM | POA: Diagnosis not present

## 2014-06-25 DIAGNOSIS — E119 Type 2 diabetes mellitus without complications: Secondary | ICD-10-CM | POA: Diagnosis not present

## 2014-06-25 DIAGNOSIS — I10 Essential (primary) hypertension: Secondary | ICD-10-CM | POA: Diagnosis not present

## 2014-07-03 ENCOUNTER — Encounter: Payer: Self-pay | Admitting: Family

## 2014-07-07 ENCOUNTER — Encounter (HOSPITAL_COMMUNITY): Payer: Medicare Other

## 2014-07-07 ENCOUNTER — Ambulatory Visit: Payer: Self-pay | Admitting: Family

## 2014-07-07 ENCOUNTER — Ambulatory Visit: Payer: Medicare Other | Admitting: Family

## 2014-07-08 DIAGNOSIS — R399 Unspecified symptoms and signs involving the genitourinary system: Secondary | ICD-10-CM | POA: Diagnosis not present

## 2014-07-08 DIAGNOSIS — N390 Urinary tract infection, site not specified: Secondary | ICD-10-CM | POA: Diagnosis not present

## 2014-07-10 ENCOUNTER — Encounter: Payer: Self-pay | Admitting: Family

## 2014-07-14 ENCOUNTER — Ambulatory Visit: Payer: Medicare Other | Admitting: Family

## 2014-07-14 ENCOUNTER — Encounter (HOSPITAL_COMMUNITY): Payer: Medicare Other

## 2014-12-25 ENCOUNTER — Ambulatory Visit: Payer: Medicare Other | Admitting: Internal Medicine

## 2014-12-25 DIAGNOSIS — R0989 Other specified symptoms and signs involving the circulatory and respiratory systems: Secondary | ICD-10-CM

## 2014-12-28 ENCOUNTER — Encounter: Payer: Self-pay | Admitting: Internal Medicine

## 2014-12-30 ENCOUNTER — Encounter: Payer: Self-pay | Admitting: Family

## 2015-01-05 ENCOUNTER — Ambulatory Visit: Payer: Medicare Other | Admitting: Family

## 2015-01-05 ENCOUNTER — Encounter (HOSPITAL_COMMUNITY): Payer: Medicare Other

## 2015-02-20 ENCOUNTER — Emergency Department (HOSPITAL_COMMUNITY)
Admission: EM | Admit: 2015-02-20 | Discharge: 2015-02-20 | Disposition: A | Discharge status: Home / Self Care | Payer: Medicare Other | Attending: Emergency Medicine | Admitting: Emergency Medicine

## 2015-02-20 ENCOUNTER — Emergency Department (HOSPITAL_COMMUNITY): Payer: Medicare Other

## 2015-02-20 ENCOUNTER — Encounter (HOSPITAL_COMMUNITY): Payer: Self-pay

## 2015-02-20 DIAGNOSIS — R6889 Other general symptoms and signs: Secondary | ICD-10-CM | POA: Diagnosis not present

## 2015-02-20 DIAGNOSIS — Z7984 Long term (current) use of oral hypoglycemic drugs: Secondary | ICD-10-CM | POA: Diagnosis not present

## 2015-02-20 DIAGNOSIS — E785 Hyperlipidemia, unspecified: Secondary | ICD-10-CM | POA: Insufficient documentation

## 2015-02-20 DIAGNOSIS — I10 Essential (primary) hypertension: Secondary | ICD-10-CM | POA: Insufficient documentation

## 2015-02-20 DIAGNOSIS — H05232 Hemorrhage of left orbit: Secondary | ICD-10-CM

## 2015-02-20 DIAGNOSIS — E1159 Type 2 diabetes mellitus with other circulatory complications: Secondary | ICD-10-CM | POA: Insufficient documentation

## 2015-02-20 DIAGNOSIS — Y9389 Activity, other specified: Secondary | ICD-10-CM | POA: Insufficient documentation

## 2015-02-20 DIAGNOSIS — K219 Gastro-esophageal reflux disease without esophagitis: Secondary | ICD-10-CM | POA: Insufficient documentation

## 2015-02-20 DIAGNOSIS — I509 Heart failure, unspecified: Secondary | ICD-10-CM | POA: Diagnosis not present

## 2015-02-20 DIAGNOSIS — Z87891 Personal history of nicotine dependence: Secondary | ICD-10-CM | POA: Diagnosis not present

## 2015-02-20 DIAGNOSIS — J449 Chronic obstructive pulmonary disease, unspecified: Secondary | ICD-10-CM | POA: Diagnosis not present

## 2015-02-20 DIAGNOSIS — D649 Anemia, unspecified: Secondary | ICD-10-CM | POA: Diagnosis not present

## 2015-02-20 DIAGNOSIS — Z950 Presence of cardiac pacemaker: Secondary | ICD-10-CM | POA: Diagnosis not present

## 2015-02-20 DIAGNOSIS — Z951 Presence of aortocoronary bypass graft: Secondary | ICD-10-CM | POA: Diagnosis not present

## 2015-02-20 DIAGNOSIS — Z794 Long term (current) use of insulin: Secondary | ICD-10-CM | POA: Insufficient documentation

## 2015-02-20 DIAGNOSIS — Y9289 Other specified places as the place of occurrence of the external cause: Secondary | ICD-10-CM | POA: Diagnosis not present

## 2015-02-20 DIAGNOSIS — I739 Peripheral vascular disease, unspecified: Secondary | ICD-10-CM | POA: Diagnosis not present

## 2015-02-20 DIAGNOSIS — Y998 Other external cause status: Secondary | ICD-10-CM | POA: Diagnosis not present

## 2015-02-20 DIAGNOSIS — I25119 Atherosclerotic heart disease of native coronary artery with unspecified angina pectoris: Secondary | ICD-10-CM | POA: Insufficient documentation

## 2015-02-20 DIAGNOSIS — Z7982 Long term (current) use of aspirin: Secondary | ICD-10-CM | POA: Diagnosis not present

## 2015-02-20 DIAGNOSIS — W01198A Fall on same level from slipping, tripping and stumbling with subsequent striking against other object, initial encounter: Secondary | ICD-10-CM | POA: Diagnosis not present

## 2015-02-20 DIAGNOSIS — F0390 Unspecified dementia without behavioral disturbance: Secondary | ICD-10-CM | POA: Diagnosis not present

## 2015-02-20 DIAGNOSIS — Z8673 Personal history of transient ischemic attack (TIA), and cerebral infarction without residual deficits: Secondary | ICD-10-CM | POA: Diagnosis not present

## 2015-02-20 DIAGNOSIS — Z79899 Other long term (current) drug therapy: Secondary | ICD-10-CM | POA: Diagnosis not present

## 2015-02-20 DIAGNOSIS — S0512XA Contusion of eyeball and orbital tissues, left eye, initial encounter: Secondary | ICD-10-CM | POA: Diagnosis not present

## 2015-02-20 DIAGNOSIS — Z9861 Coronary angioplasty status: Secondary | ICD-10-CM | POA: Insufficient documentation

## 2015-02-20 DIAGNOSIS — Z9889 Other specified postprocedural states: Secondary | ICD-10-CM | POA: Insufficient documentation

## 2015-02-20 DIAGNOSIS — I252 Old myocardial infarction: Secondary | ICD-10-CM | POA: Insufficient documentation

## 2015-02-20 DIAGNOSIS — Z8701 Personal history of pneumonia (recurrent): Secondary | ICD-10-CM | POA: Insufficient documentation

## 2015-02-20 DIAGNOSIS — S0592XA Unspecified injury of left eye and orbit, initial encounter: Secondary | ICD-10-CM | POA: Diagnosis present

## 2015-02-20 DIAGNOSIS — S0012XA Contusion of left eyelid and periocular area, initial encounter: Secondary | ICD-10-CM | POA: Insufficient documentation

## 2015-02-20 DIAGNOSIS — S0990XA Unspecified injury of head, initial encounter: Secondary | ICD-10-CM | POA: Diagnosis not present

## 2015-02-20 DIAGNOSIS — W19XXXA Unspecified fall, initial encounter: Secondary | ICD-10-CM

## 2015-02-20 NOTE — Discharge Instructions (Signed)
The CT scan today showed some blockage of your coronary arteries.  It is recommended that you follow up with your Primary Care Physician and have an ultrasound of your carotids to evaluate the blockage.

## 2015-02-20 NOTE — ED Provider Notes (Signed)
CSN: [id number]     Arrival date & time 02/20/15  1830 History   First MD Initiated Contact with Patient 02/20/15 [place]     Chief Complaint  Patient presents with  . Eye Pain     (Consider location/radiation/quality/duration/timing/severity/associated sxs/prior Treatment) HPI Comments: Patient presents today with left periorbital swelling and bruising that has been present since a fall that occurred yesterday.  He states that yesterday his right leg gave out while walking causing him to fall.  He states that his face hit the wall and then his head hit the ground when he fell.  He denies feeling dizzy prior to the fall.  He denies LOC.  He states that the periorbital swelling has become gradually worse since the fall.  He denies nausea, vomiting, vision changes, chest pain, headache, or any pain at this time.  No treatment prior to arrival.  He has been ambulatory since the fall.  He is currently on 81 mg ASA daily, but no other anticoagulants.    The history is provided by the patient.    Past Medical History  Diagnosis Date  . Coronary artery disease     Status post CABG in 2004  . Peripheral vascular disease ([place])     Status post right femoropopliteal bypass  . Dyslipidemia   . Diabetes mellitus with peripheral artery disease ([place])   . Hypertension   . Chronic renal insufficiency     creatinine around 2.4  . CHF (congestive heart failure) ([place]) 2007    ef 38%  . History of CVA (cerebrovascular accident)   . Cardiomyopathy   . COPD (chronic obstructive pulmonary disease) ([place])   . History of anemia   . Myocardial infarction ([place])   . Shortness of breath   . Pacemaker   . Pneumonia   . Anginal pain ([place])   . Dementia   . GERD (gastroesophageal reflux disease)   . Arthritis   . Anemia 10/29/2013   Past Surgical History  Procedure Laterality Date  . Insert / replace / remove pacemaker  05/17/2005    [employer] dual chamber ddd mode   . Coronary artery bypass graft  4 of 2004     4 vessel bypass  . Femoral bypass  july 2006    right with right great toe amputation  . Cardiac catheterization  04/29/2002    Dr. Glade Lloyd  . Esophagogastroduodenoscopy  07/21/2011    Procedure: ESOPHAGOGASTRODUODENOSCOPY (EGD);  Surgeon: Beryle Beams, MD;  Location: [facility];  Service: Endoscopy;  Laterality: N/A;  . Coronary angioplasty    . Femoral-popliteal bypass graft Left 05/01/2013    Procedure: LEFT FEMORAL TO ABOVE KNEE POPLITEAL ARTERY BYPASS GRAFT WITH INTRAOPERATIVE ARTEROGRAM;  Surgeon: Mal Misty, MD;  Location: [place];  Service: Vascular;  Laterality: Left;  . Amputation Left 05/05/2013    Procedure: AMPUTATION RAY- FIRST AND SECOND-LEFT FOOT;  Surgeon: Newt Minion, MD;  Location: [place];  Service: Orthopedics;  Laterality: Left;  . Tee without cardioversion N/A 10/23/2013    Procedure: TRANSESOPHAGEAL ECHOCARDIOGRAM (TEE);  Surgeon: Dorothy Spark, MD;  Location: [facility] ENDOSCOPY;  Service: Cardiovascular;  Laterality: N/A;  . Tee without cardioversion N/A 10/28/2013    Procedure: TRANSESOPHAGEAL ECHOCARDIOGRAM (TEE);  Surgeon: Dorothy Spark, MD;  Location: [facility] ENDOSCOPY;  Service: Cardiovascular;  Laterality: N/A;  . Pacemaker lead removal Right 10/27/2013    Procedure: PACEMAKER LEAD REMOVAL;  Surgeon: Evans Lance, MD;  Location: [place];  Service: Cardiovascular;  Laterality:  Right;  Roxy Manns backing up  . Lower extremity angiogram N/A 04/30/2013    Procedure: LOWER EXTREMITY ANGIOGRAM;  Surgeon: Mal Misty, MD;  Location: [facility] CATH LAB;  Service: Cardiovascular;  Laterality: N/A;  . Abdominal angiogram  04/30/2013    Procedure: ABDOMINAL ANGIOGRAM;  Surgeon: Mal Misty, MD;  Location: [facility] CATH LAB;  Service: Cardiovascular;;   Family History  Problem Relation Age of Onset  . Hyperlipidemia Mother   . Heart disease Father   . Diabetes    . Diabetes Sister   . Hypertension Sister   . Hyperlipidemia Sister    Social History  Substance Use Topics  . Smoking  status: Former Smoker -- 1.00 packs/day for 50 years    Types: Cigarettes    Quit date: 06/09/2002  . Smokeless tobacco: Never Used  . Alcohol Use: No    Review of Systems  All other systems reviewed and are negative.     Allergies  Lisinopril  Home Medications   Prior to Admission medications   Medication Sig Start Date End Date Taking? Authorizing Provider  acetaminophen (TYLENOL) 325 MG tablet Take 2 tablets (650 mg total) by mouth every 6 (six) hours as needed for mild pain or fever. 10/31/13   Delfina Redwood, MD  aspirin EC 81 MG tablet Take 81 mg by mouth daily.    Historical Provider, MD  B Complex-C (B-COMPLEX WITH VITAMIN C) tablet Take 1 tablet by mouth daily.    Historical Provider, MD  carvedilol (COREG) 6.25 MG tablet TAKE 1 TABLET BY MOUTH TWICE A DAY 06/23/14   Sueanne Margarita, MD  dextrose 4 G chewable tablet Chew 16 g by mouth daily as needed for low blood sugar.    Historical Provider, MD  docusate sodium 100 MG CAPS Take 100 mg by mouth 2 (two) times daily. 10/31/13   Delfina Redwood, MD  ezetimibe (ZETIA) 10 MG tablet Take 10 mg by mouth daily.    Historical Provider, MD  ferrous sulfate 325 (65 FE) MG tablet Take 1 tablet (325 mg total) by mouth daily with breakfast. 10/31/13   Delfina Redwood, MD  furosemide (LASIX) 40 MG tablet TAKE 1 TABLET BY MOUTH EVERY DAY 04/22/14   Evans Lance, MD  hydrALAZINE (APRESOLINE) 10 MG tablet Take 1 tablet (10 mg total) by mouth every 8 (eight) hours. 10/31/13   Delfina Redwood, MD  insulin aspart (NOVOLOG) 100 UNIT/ML injection Inject 3 Units into the skin 3 (three) times daily before meals. For CBG greater than 150 for DM    Historical Provider, MD  insulin glargine (LANTUS) 100 UNIT/ML injection Inject 0.1 mLs (10 Units total) into the skin at bedtime. 10/31/13   Delfina Redwood, MD  isosorbide mononitrate (IMDUR) 30 MG 24 hr tablet TAKE 1 TABLET BY MOUTH EVERY DAY 04/22/14   Evans Lance, MD  linagliptin  (TRADJENTA) 5 MG TABS tablet Take 5 mg by mouth daily.    Historical Provider, MD  Omega-3 Fatty Acids (FISH OIL) 1000 MG CAPS Take 1,000 mg by mouth daily.     Historical Provider, MD  omeprazole (PRILOSEC) 40 MG capsule Take 40 mg by mouth daily.    Historical Provider, MD  ondansetron (ZOFRAN) 4 MG tablet Take 1 tablet (4 mg total) by mouth every 6 (six) hours as needed for nausea. 10/31/13   Delfina Redwood, MD  vitamin B-12 (CYANOCOBALAMIN) 1000 MCG tablet Take 1,000 mcg by mouth daily.    Historical Provider,  MD   BP 173/62 mmHg  Pulse 62  Temp(Src) 98.7 F (37.1 C) (Oral)  Resp 16  SpO2 100% Physical Exam  Constitutional: He is oriented to person, place, and time. He appears well-developed and well-nourished.  HENT:  Head: Normocephalic.  No scalp hematoma, abrasion, or laceration  Eyes: EOM are normal. Pupils are equal, round, and reactive to light.  Left periorbital ecchymosis, edema, and tenderness  Neck: Normal range of motion. Neck supple.  Cardiovascular: Normal rate, regular rhythm and normal heart sounds.   Pulmonary/Chest: Effort normal and breath sounds normal.  Musculoskeletal:       Cervical back: He exhibits normal range of motion, no tenderness, no bony tenderness, no swelling, no edema and no deformity.       Thoracic back: He exhibits normal range of motion, no tenderness, no bony tenderness, no swelling, no edema and no deformity.       Lumbar back: He exhibits normal range of motion, no tenderness, no bony tenderness, no swelling, no edema and no deformity.  Neurological: He is alert and oriented to person, place, and time. He has normal strength. No cranial nerve deficit or sensory deficit.  Patient ambulatory with cane, no ataxia  Skin: Skin is warm and dry.  Psychiatric: He has a normal mood and affect.  Nursing note and vitals reviewed.   ED Course  Procedures (including critical care time) Labs Review Labs Reviewed - No data to display  Imaging  Review Ct Head Wo Contrast  02/20/2015  CLINICAL DATA:  Stumbled in hallway, when leg gave out. Hit left orbit, with swelling and bruising. Concern for head injury. Initial encounter. EXAM: CT HEAD WITHOUT CONTRAST CT MAXILLOFACIAL WITHOUT CONTRAST TECHNIQUE: Multidetector CT imaging of the head and maxillofacial structures were performed using the standard protocol without intravenous contrast. Multiplanar CT image reconstructions of the maxillofacial structures were also generated. COMPARISON:  CT of the head performed 10/19/2013 FINDINGS: CT HEAD FINDINGS There is no evidence of acute infarction, mass lesion, or intra- or extra-axial hemorrhage on CT. Prominence of the ventricles and sulci reflects moderate cortical volume loss. Cerebellar atrophy is noted. Scattered periventricular and subcortical white matter change likely reflects small vessel ischemic microangiopathy. A chronic lacunar infarct is noted at the right basal ganglia. A small chronic infarct is noted at the high left frontoparietal region, with associated encephalomalacia. The brainstem and fourth ventricle are within normal limits. No mass effect or midline shift is seen. There is no evidence of fracture; visualized osseous structures are unremarkable in appearance. The visualized portions of the orbits are within normal limits. The paranasal sinuses and mastoid air cells are well-aerated. Diffuse soft tissue swelling is noted overlying the left frontal calvarium, nose, and surrounding the left orbit. CT MAXILLOFACIAL FINDINGS There is no evidence of fracture or dislocation. The maxilla and mandible appear intact. The nasal bone is unremarkable in appearance. There is complete chronic absence of the dentition. Bony expansion at the left sphenoid bone may reflect Paget's disease. The orbits are intact bilaterally. The visualized paranasal sinuses and mastoid air cells are well-aerated. Soft tissue swelling is noted overlying the left frontal  calvarium, and surrounding the left orbit. The parapharyngeal fat planes are preserved. The nasopharynx, oropharynx and hypopharynx are unremarkable in appearance. The visualized portions of the valleculae and piriform sinuses are grossly unremarkable. Dense calcification is noted at the carotid bifurcations bilaterally. The parotid and submandibular glands are within normal limits. No cervical lymphadenopathy is seen. IMPRESSION: 1. No evidence of traumatic  intracranial injury or fracture. 2. No evidence of fracture or dislocation with regard to the maxillofacial structures. 3. Diffuse soft tissue swelling overlying the left frontal calvarium, nose, and surrounding the left orbit. 4. Moderate cortical volume loss and scattered small vessel ischemic microangiopathy. 5. Chronic lacunar infarct at the right basal ganglia, and small chronic infarct at the high left frontoparietal region, with associated encephalomalacia. 6. Dense calcification at the carotid bifurcations bilaterally. Carotid ultrasound would be helpful for further evaluation, as deemed clinically appropriate. 7. Bony expansion at the left sphenoid bone may reflect Paget's disease. Electronically Signed   By: Garald Balding M.D.   On: 02/20/2015 22:03   Ct Maxillofacial Wo Cm  02/20/2015  CLINICAL DATA:  Stumbled in hallway, when leg gave out. Hit left orbit, with swelling and bruising. Concern for head injury. Initial encounter. EXAM: CT HEAD WITHOUT CONTRAST CT MAXILLOFACIAL WITHOUT CONTRAST TECHNIQUE: Multidetector CT imaging of the head and maxillofacial structures were performed using the standard protocol without intravenous contrast. Multiplanar CT image reconstructions of the maxillofacial structures were also generated. COMPARISON:  CT of the head performed 10/19/2013 FINDINGS: CT HEAD FINDINGS There is no evidence of acute infarction, mass lesion, or intra- or extra-axial hemorrhage on CT. Prominence of the ventricles and sulci reflects  moderate cortical volume loss. Cerebellar atrophy is noted. Scattered periventricular and subcortical white matter change likely reflects small vessel ischemic microangiopathy. A chronic lacunar infarct is noted at the right basal ganglia. A small chronic infarct is noted at the high left frontoparietal region, with associated encephalomalacia. The brainstem and fourth ventricle are within normal limits. No mass effect or midline shift is seen. There is no evidence of fracture; visualized osseous structures are unremarkable in appearance. The visualized portions of the orbits are within normal limits. The paranasal sinuses and mastoid air cells are well-aerated. Diffuse soft tissue swelling is noted overlying the left frontal calvarium, nose, and surrounding the left orbit. CT MAXILLOFACIAL FINDINGS There is no evidence of fracture or dislocation. The maxilla and mandible appear intact. The nasal bone is unremarkable in appearance. There is complete chronic absence of the dentition. Bony expansion at the left sphenoid bone may reflect Paget's disease. The orbits are intact bilaterally. The visualized paranasal sinuses and mastoid air cells are well-aerated. Soft tissue swelling is noted overlying the left frontal calvarium, and surrounding the left orbit. The parapharyngeal fat planes are preserved. The nasopharynx, oropharynx and hypopharynx are unremarkable in appearance. The visualized portions of the valleculae and piriform sinuses are grossly unremarkable. Dense calcification is noted at the carotid bifurcations bilaterally. The parotid and submandibular glands are within normal limits. No cervical lymphadenopathy is seen. IMPRESSION: 1. No evidence of traumatic intracranial injury or fracture. 2. No evidence of fracture or dislocation with regard to the maxillofacial structures. 3. Diffuse soft tissue swelling overlying the left frontal calvarium, nose, and surrounding the left orbit. 4. Moderate cortical  volume loss and scattered small vessel ischemic microangiopathy. 5. Chronic lacunar infarct at the right basal ganglia, and small chronic infarct at the high left frontoparietal region, with associated encephalomalacia. 6. Dense calcification at the carotid bifurcations bilaterally. Carotid ultrasound would be helpful for further evaluation, as deemed clinically appropriate. 7. Bony expansion at the left sphenoid bone may reflect Paget's disease. Electronically Signed   By: Garald Balding M.D.   On: 02/20/2015 22:03   I have personally reviewed and evaluated these images and lab results as part of my medical decision-making.   EKG Interpretation None  MDM   Final diagnoses:  None   Patient presents today with periorbital pain, swelling, and bruising that has been present since a mechanical fall that occurred yesterday.  He denies any pain or vision changes at this time.  Full ROM on exam.  No spinal tenderness.  CT maxillofacial and CT head results as above do not show any acute changes.  Family informed of the dense calcification at the carotid bifurcations and the need for outpatient carotid ultrasound.  Patient ambulatory in the ED with his cane.  Feel that the patient is stable for discharge.  Patient also evaluated by Dr. Vanita Panda who is in agreement with the plan.  Return precautions given.    Hyman Bible, PA-C 02/21/15 0041  Carmin Muskrat, MD 02/21/15 [phone]

## 2015-02-20 NOTE — ED Notes (Signed)
Per EMS - pt from home. Pt stumbled in hallway yesterday when leg gave out (denies dizziness prior to falling). Pt hit left eye - left eye swollen and bruised. Pt denies blurred vision. Denies pain today.

## 2015-02-23 DIAGNOSIS — R29898 Other symptoms and signs involving the musculoskeletal system: Secondary | ICD-10-CM | POA: Diagnosis not present

## 2015-02-23 DIAGNOSIS — R2689 Other abnormalities of gait and mobility: Secondary | ICD-10-CM | POA: Diagnosis not present

## 2015-02-23 DIAGNOSIS — W19XXXD Unspecified fall, subsequent encounter: Secondary | ICD-10-CM | POA: Diagnosis not present

## 2015-02-24 ENCOUNTER — Other Ambulatory Visit: Payer: Self-pay | Admitting: Internal Medicine

## 2015-02-24 DIAGNOSIS — I6523 Occlusion and stenosis of bilateral carotid arteries: Secondary | ICD-10-CM

## 2015-02-26 DIAGNOSIS — R29898 Other symptoms and signs involving the musculoskeletal system: Secondary | ICD-10-CM | POA: Diagnosis not present

## 2015-02-26 DIAGNOSIS — R2689 Other abnormalities of gait and mobility: Secondary | ICD-10-CM | POA: Diagnosis not present

## 2015-02-26 DIAGNOSIS — R630 Anorexia: Secondary | ICD-10-CM | POA: Diagnosis not present

## 2015-02-26 DIAGNOSIS — R1310 Dysphagia, unspecified: Secondary | ICD-10-CM | POA: Diagnosis not present

## 2015-03-01 ENCOUNTER — Ambulatory Visit
Admission: RE | Admit: 2015-03-01 | Discharge: 2015-03-01 | Disposition: A | Discharge status: Home / Self Care | Payer: Medicare Other | Source: Ambulatory Visit | Attending: Internal Medicine | Admitting: Internal Medicine

## 2015-03-01 DIAGNOSIS — I6523 Occlusion and stenosis of bilateral carotid arteries: Secondary | ICD-10-CM

## 2015-03-01 DIAGNOSIS — R29898 Other symptoms and signs involving the musculoskeletal system: Secondary | ICD-10-CM | POA: Diagnosis not present

## 2015-03-01 DIAGNOSIS — R1310 Dysphagia, unspecified: Secondary | ICD-10-CM | POA: Diagnosis not present

## 2015-03-01 DIAGNOSIS — R2689 Other abnormalities of gait and mobility: Secondary | ICD-10-CM | POA: Diagnosis not present

## 2015-03-01 DIAGNOSIS — R630 Anorexia: Secondary | ICD-10-CM | POA: Diagnosis not present

## 2015-03-02 DIAGNOSIS — R29898 Other symptoms and signs involving the musculoskeletal system: Secondary | ICD-10-CM | POA: Diagnosis not present

## 2015-03-02 DIAGNOSIS — R1310 Dysphagia, unspecified: Secondary | ICD-10-CM | POA: Diagnosis not present

## 2015-03-02 DIAGNOSIS — R2689 Other abnormalities of gait and mobility: Secondary | ICD-10-CM | POA: Diagnosis not present

## 2015-03-02 DIAGNOSIS — R630 Anorexia: Secondary | ICD-10-CM | POA: Diagnosis not present

## 2015-03-04 DIAGNOSIS — R2689 Other abnormalities of gait and mobility: Secondary | ICD-10-CM | POA: Diagnosis not present

## 2015-03-04 DIAGNOSIS — R630 Anorexia: Secondary | ICD-10-CM | POA: Diagnosis not present

## 2015-03-04 DIAGNOSIS — R29898 Other symptoms and signs involving the musculoskeletal system: Secondary | ICD-10-CM | POA: Diagnosis not present

## 2015-03-04 DIAGNOSIS — R1310 Dysphagia, unspecified: Secondary | ICD-10-CM | POA: Diagnosis not present

## 2015-03-05 DIAGNOSIS — R2689 Other abnormalities of gait and mobility: Secondary | ICD-10-CM | POA: Diagnosis not present

## 2015-03-05 DIAGNOSIS — R1310 Dysphagia, unspecified: Secondary | ICD-10-CM | POA: Diagnosis not present

## 2015-03-05 DIAGNOSIS — R29898 Other symptoms and signs involving the musculoskeletal system: Secondary | ICD-10-CM | POA: Diagnosis not present

## 2015-03-05 DIAGNOSIS — R630 Anorexia: Secondary | ICD-10-CM | POA: Diagnosis not present

## 2015-03-09 DIAGNOSIS — R1310 Dysphagia, unspecified: Secondary | ICD-10-CM | POA: Diagnosis not present

## 2015-03-09 DIAGNOSIS — R29898 Other symptoms and signs involving the musculoskeletal system: Secondary | ICD-10-CM | POA: Diagnosis not present

## 2015-03-09 DIAGNOSIS — R630 Anorexia: Secondary | ICD-10-CM | POA: Diagnosis not present

## 2015-03-09 DIAGNOSIS — R2689 Other abnormalities of gait and mobility: Secondary | ICD-10-CM | POA: Diagnosis not present

## 2015-03-10 DIAGNOSIS — R630 Anorexia: Secondary | ICD-10-CM | POA: Diagnosis not present

## 2015-03-10 DIAGNOSIS — R29898 Other symptoms and signs involving the musculoskeletal system: Secondary | ICD-10-CM | POA: Diagnosis not present

## 2015-03-10 DIAGNOSIS — R1310 Dysphagia, unspecified: Secondary | ICD-10-CM | POA: Diagnosis not present

## 2015-03-10 DIAGNOSIS — R2689 Other abnormalities of gait and mobility: Secondary | ICD-10-CM | POA: Diagnosis not present

## 2015-03-11 DIAGNOSIS — R2689 Other abnormalities of gait and mobility: Secondary | ICD-10-CM | POA: Diagnosis not present

## 2015-03-11 DIAGNOSIS — R1310 Dysphagia, unspecified: Secondary | ICD-10-CM | POA: Diagnosis not present

## 2015-03-11 DIAGNOSIS — R630 Anorexia: Secondary | ICD-10-CM | POA: Diagnosis not present

## 2015-03-11 DIAGNOSIS — R29898 Other symptoms and signs involving the musculoskeletal system: Secondary | ICD-10-CM | POA: Diagnosis not present

## 2015-03-12 DIAGNOSIS — R1310 Dysphagia, unspecified: Secondary | ICD-10-CM | POA: Diagnosis not present

## 2015-03-12 DIAGNOSIS — R29898 Other symptoms and signs involving the musculoskeletal system: Secondary | ICD-10-CM | POA: Diagnosis not present

## 2015-03-12 DIAGNOSIS — R630 Anorexia: Secondary | ICD-10-CM | POA: Diagnosis not present

## 2015-03-12 DIAGNOSIS — R2689 Other abnormalities of gait and mobility: Secondary | ICD-10-CM | POA: Diagnosis not present

## 2015-03-15 DIAGNOSIS — I10 Essential (primary) hypertension: Secondary | ICD-10-CM | POA: Diagnosis not present

## 2015-03-15 DIAGNOSIS — E119 Type 2 diabetes mellitus without complications: Secondary | ICD-10-CM | POA: Diagnosis not present

## 2015-03-15 DIAGNOSIS — R630 Anorexia: Secondary | ICD-10-CM | POA: Diagnosis not present

## 2015-03-15 DIAGNOSIS — I509 Heart failure, unspecified: Secondary | ICD-10-CM | POA: Diagnosis not present

## 2015-03-15 DIAGNOSIS — E785 Hyperlipidemia, unspecified: Secondary | ICD-10-CM | POA: Diagnosis not present

## 2015-03-15 DIAGNOSIS — R1310 Dysphagia, unspecified: Secondary | ICD-10-CM | POA: Diagnosis not present

## 2015-03-15 DIAGNOSIS — R2689 Other abnormalities of gait and mobility: Secondary | ICD-10-CM | POA: Diagnosis not present

## 2015-03-15 DIAGNOSIS — R29898 Other symptoms and signs involving the musculoskeletal system: Secondary | ICD-10-CM | POA: Diagnosis not present

## 2015-03-17 DIAGNOSIS — R1310 Dysphagia, unspecified: Secondary | ICD-10-CM | POA: Diagnosis not present

## 2015-03-17 DIAGNOSIS — R29898 Other symptoms and signs involving the musculoskeletal system: Secondary | ICD-10-CM | POA: Diagnosis not present

## 2015-03-17 DIAGNOSIS — R630 Anorexia: Secondary | ICD-10-CM | POA: Diagnosis not present

## 2015-03-17 DIAGNOSIS — R2689 Other abnormalities of gait and mobility: Secondary | ICD-10-CM | POA: Diagnosis not present

## 2015-03-19 DIAGNOSIS — R2689 Other abnormalities of gait and mobility: Secondary | ICD-10-CM | POA: Diagnosis not present

## 2015-03-19 DIAGNOSIS — R630 Anorexia: Secondary | ICD-10-CM | POA: Diagnosis not present

## 2015-03-19 DIAGNOSIS — R29898 Other symptoms and signs involving the musculoskeletal system: Secondary | ICD-10-CM | POA: Diagnosis not present

## 2015-03-19 DIAGNOSIS — R1310 Dysphagia, unspecified: Secondary | ICD-10-CM | POA: Diagnosis not present

## 2015-03-22 DIAGNOSIS — R29898 Other symptoms and signs involving the musculoskeletal system: Secondary | ICD-10-CM | POA: Diagnosis not present

## 2015-03-22 DIAGNOSIS — E1122 Type 2 diabetes mellitus with diabetic chronic kidney disease: Secondary | ICD-10-CM | POA: Diagnosis not present

## 2015-03-22 DIAGNOSIS — J449 Chronic obstructive pulmonary disease, unspecified: Secondary | ICD-10-CM | POA: Diagnosis not present

## 2015-03-22 DIAGNOSIS — I6523 Occlusion and stenosis of bilateral carotid arteries: Secondary | ICD-10-CM | POA: Diagnosis not present

## 2015-03-22 DIAGNOSIS — I130 Hypertensive heart and chronic kidney disease with heart failure and stage 1 through stage 4 chronic kidney disease, or unspecified chronic kidney disease: Secondary | ICD-10-CM | POA: Diagnosis not present

## 2015-03-22 DIAGNOSIS — N184 Chronic kidney disease, stage 4 (severe): Secondary | ICD-10-CM | POA: Diagnosis not present

## 2015-03-22 DIAGNOSIS — I509 Heart failure, unspecified: Secondary | ICD-10-CM | POA: Diagnosis not present

## 2015-03-22 DIAGNOSIS — E1140 Type 2 diabetes mellitus with diabetic neuropathy, unspecified: Secondary | ICD-10-CM | POA: Diagnosis not present

## 2015-03-22 DIAGNOSIS — R1310 Dysphagia, unspecified: Secondary | ICD-10-CM | POA: Diagnosis not present

## 2015-03-22 DIAGNOSIS — R2689 Other abnormalities of gait and mobility: Secondary | ICD-10-CM | POA: Diagnosis not present

## 2015-03-22 DIAGNOSIS — E1151 Type 2 diabetes mellitus with diabetic peripheral angiopathy without gangrene: Secondary | ICD-10-CM | POA: Diagnosis not present

## 2015-03-22 DIAGNOSIS — M880 Osteitis deformans of skull: Secondary | ICD-10-CM | POA: Diagnosis not present

## 2015-03-24 DIAGNOSIS — N184 Chronic kidney disease, stage 4 (severe): Secondary | ICD-10-CM | POA: Diagnosis not present

## 2015-03-24 DIAGNOSIS — I6523 Occlusion and stenosis of bilateral carotid arteries: Secondary | ICD-10-CM | POA: Diagnosis not present

## 2015-03-24 DIAGNOSIS — J449 Chronic obstructive pulmonary disease, unspecified: Secondary | ICD-10-CM | POA: Diagnosis not present

## 2015-03-24 DIAGNOSIS — R2689 Other abnormalities of gait and mobility: Secondary | ICD-10-CM | POA: Diagnosis not present

## 2015-03-24 DIAGNOSIS — E1122 Type 2 diabetes mellitus with diabetic chronic kidney disease: Secondary | ICD-10-CM | POA: Diagnosis not present

## 2015-03-24 DIAGNOSIS — R29898 Other symptoms and signs involving the musculoskeletal system: Secondary | ICD-10-CM | POA: Diagnosis not present

## 2015-03-24 DIAGNOSIS — M880 Osteitis deformans of skull: Secondary | ICD-10-CM | POA: Diagnosis not present

## 2015-03-24 DIAGNOSIS — I130 Hypertensive heart and chronic kidney disease with heart failure and stage 1 through stage 4 chronic kidney disease, or unspecified chronic kidney disease: Secondary | ICD-10-CM | POA: Diagnosis not present

## 2015-03-24 DIAGNOSIS — E1151 Type 2 diabetes mellitus with diabetic peripheral angiopathy without gangrene: Secondary | ICD-10-CM | POA: Diagnosis not present

## 2015-03-24 DIAGNOSIS — R1310 Dysphagia, unspecified: Secondary | ICD-10-CM | POA: Diagnosis not present

## 2015-03-24 DIAGNOSIS — I509 Heart failure, unspecified: Secondary | ICD-10-CM | POA: Diagnosis not present

## 2015-03-24 DIAGNOSIS — E1140 Type 2 diabetes mellitus with diabetic neuropathy, unspecified: Secondary | ICD-10-CM | POA: Diagnosis not present

## 2015-03-26 DIAGNOSIS — R2689 Other abnormalities of gait and mobility: Secondary | ICD-10-CM | POA: Diagnosis not present

## 2015-03-26 DIAGNOSIS — E1151 Type 2 diabetes mellitus with diabetic peripheral angiopathy without gangrene: Secondary | ICD-10-CM | POA: Diagnosis not present

## 2015-03-26 DIAGNOSIS — E1140 Type 2 diabetes mellitus with diabetic neuropathy, unspecified: Secondary | ICD-10-CM | POA: Diagnosis not present

## 2015-03-26 DIAGNOSIS — J449 Chronic obstructive pulmonary disease, unspecified: Secondary | ICD-10-CM | POA: Diagnosis not present

## 2015-03-26 DIAGNOSIS — I509 Heart failure, unspecified: Secondary | ICD-10-CM | POA: Diagnosis not present

## 2015-03-26 DIAGNOSIS — E1122 Type 2 diabetes mellitus with diabetic chronic kidney disease: Secondary | ICD-10-CM | POA: Diagnosis not present

## 2015-03-26 DIAGNOSIS — R29898 Other symptoms and signs involving the musculoskeletal system: Secondary | ICD-10-CM | POA: Diagnosis not present

## 2015-03-26 DIAGNOSIS — I6523 Occlusion and stenosis of bilateral carotid arteries: Secondary | ICD-10-CM | POA: Diagnosis not present

## 2015-03-26 DIAGNOSIS — I130 Hypertensive heart and chronic kidney disease with heart failure and stage 1 through stage 4 chronic kidney disease, or unspecified chronic kidney disease: Secondary | ICD-10-CM | POA: Diagnosis not present

## 2015-03-26 DIAGNOSIS — N184 Chronic kidney disease, stage 4 (severe): Secondary | ICD-10-CM | POA: Diagnosis not present

## 2015-03-26 DIAGNOSIS — M880 Osteitis deformans of skull: Secondary | ICD-10-CM | POA: Diagnosis not present

## 2015-03-26 DIAGNOSIS — R1310 Dysphagia, unspecified: Secondary | ICD-10-CM | POA: Diagnosis not present

## 2015-03-29 DIAGNOSIS — E1151 Type 2 diabetes mellitus with diabetic peripheral angiopathy without gangrene: Secondary | ICD-10-CM | POA: Diagnosis not present

## 2015-03-29 DIAGNOSIS — N184 Chronic kidney disease, stage 4 (severe): Secondary | ICD-10-CM | POA: Diagnosis not present

## 2015-03-29 DIAGNOSIS — R2689 Other abnormalities of gait and mobility: Secondary | ICD-10-CM | POA: Diagnosis not present

## 2015-03-29 DIAGNOSIS — E1140 Type 2 diabetes mellitus with diabetic neuropathy, unspecified: Secondary | ICD-10-CM | POA: Diagnosis not present

## 2015-03-29 DIAGNOSIS — I509 Heart failure, unspecified: Secondary | ICD-10-CM | POA: Diagnosis not present

## 2015-03-29 DIAGNOSIS — E1122 Type 2 diabetes mellitus with diabetic chronic kidney disease: Secondary | ICD-10-CM | POA: Diagnosis not present

## 2015-03-29 DIAGNOSIS — I130 Hypertensive heart and chronic kidney disease with heart failure and stage 1 through stage 4 chronic kidney disease, or unspecified chronic kidney disease: Secondary | ICD-10-CM | POA: Diagnosis not present

## 2015-03-29 DIAGNOSIS — M880 Osteitis deformans of skull: Secondary | ICD-10-CM | POA: Diagnosis not present

## 2015-03-29 DIAGNOSIS — R1310 Dysphagia, unspecified: Secondary | ICD-10-CM | POA: Diagnosis not present

## 2015-03-29 DIAGNOSIS — R29898 Other symptoms and signs involving the musculoskeletal system: Secondary | ICD-10-CM | POA: Diagnosis not present

## 2015-03-29 DIAGNOSIS — J449 Chronic obstructive pulmonary disease, unspecified: Secondary | ICD-10-CM | POA: Diagnosis not present

## 2015-03-29 DIAGNOSIS — I6523 Occlusion and stenosis of bilateral carotid arteries: Secondary | ICD-10-CM | POA: Diagnosis not present

## 2015-03-31 DIAGNOSIS — J449 Chronic obstructive pulmonary disease, unspecified: Secondary | ICD-10-CM | POA: Diagnosis not present

## 2015-03-31 DIAGNOSIS — N184 Chronic kidney disease, stage 4 (severe): Secondary | ICD-10-CM | POA: Diagnosis not present

## 2015-03-31 DIAGNOSIS — I6523 Occlusion and stenosis of bilateral carotid arteries: Secondary | ICD-10-CM | POA: Diagnosis not present

## 2015-03-31 DIAGNOSIS — I509 Heart failure, unspecified: Secondary | ICD-10-CM | POA: Diagnosis not present

## 2015-03-31 DIAGNOSIS — R2689 Other abnormalities of gait and mobility: Secondary | ICD-10-CM | POA: Diagnosis not present

## 2015-03-31 DIAGNOSIS — E1122 Type 2 diabetes mellitus with diabetic chronic kidney disease: Secondary | ICD-10-CM | POA: Diagnosis not present

## 2015-03-31 DIAGNOSIS — M880 Osteitis deformans of skull: Secondary | ICD-10-CM | POA: Diagnosis not present

## 2015-03-31 DIAGNOSIS — E1140 Type 2 diabetes mellitus with diabetic neuropathy, unspecified: Secondary | ICD-10-CM | POA: Diagnosis not present

## 2015-03-31 DIAGNOSIS — R1310 Dysphagia, unspecified: Secondary | ICD-10-CM | POA: Diagnosis not present

## 2015-03-31 DIAGNOSIS — E1151 Type 2 diabetes mellitus with diabetic peripheral angiopathy without gangrene: Secondary | ICD-10-CM | POA: Diagnosis not present

## 2015-03-31 DIAGNOSIS — I130 Hypertensive heart and chronic kidney disease with heart failure and stage 1 through stage 4 chronic kidney disease, or unspecified chronic kidney disease: Secondary | ICD-10-CM | POA: Diagnosis not present

## 2015-03-31 DIAGNOSIS — R29898 Other symptoms and signs involving the musculoskeletal system: Secondary | ICD-10-CM | POA: Diagnosis not present

## 2015-04-05 DIAGNOSIS — R1310 Dysphagia, unspecified: Secondary | ICD-10-CM | POA: Diagnosis not present

## 2015-04-05 DIAGNOSIS — I130 Hypertensive heart and chronic kidney disease with heart failure and stage 1 through stage 4 chronic kidney disease, or unspecified chronic kidney disease: Secondary | ICD-10-CM | POA: Diagnosis not present

## 2015-04-05 DIAGNOSIS — I509 Heart failure, unspecified: Secondary | ICD-10-CM | POA: Diagnosis not present

## 2015-04-05 DIAGNOSIS — M880 Osteitis deformans of skull: Secondary | ICD-10-CM | POA: Diagnosis not present

## 2015-04-05 DIAGNOSIS — J449 Chronic obstructive pulmonary disease, unspecified: Secondary | ICD-10-CM | POA: Diagnosis not present

## 2015-04-05 DIAGNOSIS — E1140 Type 2 diabetes mellitus with diabetic neuropathy, unspecified: Secondary | ICD-10-CM | POA: Diagnosis not present

## 2015-04-05 DIAGNOSIS — I6523 Occlusion and stenosis of bilateral carotid arteries: Secondary | ICD-10-CM | POA: Diagnosis not present

## 2015-04-05 DIAGNOSIS — R29898 Other symptoms and signs involving the musculoskeletal system: Secondary | ICD-10-CM | POA: Diagnosis not present

## 2015-04-05 DIAGNOSIS — R2689 Other abnormalities of gait and mobility: Secondary | ICD-10-CM | POA: Diagnosis not present

## 2015-04-05 DIAGNOSIS — E1151 Type 2 diabetes mellitus with diabetic peripheral angiopathy without gangrene: Secondary | ICD-10-CM | POA: Diagnosis not present

## 2015-04-05 DIAGNOSIS — N184 Chronic kidney disease, stage 4 (severe): Secondary | ICD-10-CM | POA: Diagnosis not present

## 2015-04-05 DIAGNOSIS — E1122 Type 2 diabetes mellitus with diabetic chronic kidney disease: Secondary | ICD-10-CM | POA: Diagnosis not present

## 2015-04-06 DIAGNOSIS — I130 Hypertensive heart and chronic kidney disease with heart failure and stage 1 through stage 4 chronic kidney disease, or unspecified chronic kidney disease: Secondary | ICD-10-CM | POA: Diagnosis not present

## 2015-04-06 DIAGNOSIS — E1151 Type 2 diabetes mellitus with diabetic peripheral angiopathy without gangrene: Secondary | ICD-10-CM | POA: Diagnosis not present

## 2015-04-06 DIAGNOSIS — R1310 Dysphagia, unspecified: Secondary | ICD-10-CM | POA: Diagnosis not present

## 2015-04-06 DIAGNOSIS — E1122 Type 2 diabetes mellitus with diabetic chronic kidney disease: Secondary | ICD-10-CM | POA: Diagnosis not present

## 2015-04-06 DIAGNOSIS — J449 Chronic obstructive pulmonary disease, unspecified: Secondary | ICD-10-CM | POA: Diagnosis not present

## 2015-04-06 DIAGNOSIS — I509 Heart failure, unspecified: Secondary | ICD-10-CM | POA: Diagnosis not present

## 2015-04-06 DIAGNOSIS — M880 Osteitis deformans of skull: Secondary | ICD-10-CM | POA: Diagnosis not present

## 2015-04-06 DIAGNOSIS — N184 Chronic kidney disease, stage 4 (severe): Secondary | ICD-10-CM | POA: Diagnosis not present

## 2015-04-06 DIAGNOSIS — E1140 Type 2 diabetes mellitus with diabetic neuropathy, unspecified: Secondary | ICD-10-CM | POA: Diagnosis not present

## 2015-04-06 DIAGNOSIS — R2689 Other abnormalities of gait and mobility: Secondary | ICD-10-CM | POA: Diagnosis not present

## 2015-04-06 DIAGNOSIS — R29898 Other symptoms and signs involving the musculoskeletal system: Secondary | ICD-10-CM | POA: Diagnosis not present

## 2015-04-06 DIAGNOSIS — I6523 Occlusion and stenosis of bilateral carotid arteries: Secondary | ICD-10-CM | POA: Diagnosis not present

## 2015-04-07 DIAGNOSIS — I509 Heart failure, unspecified: Secondary | ICD-10-CM | POA: Diagnosis not present

## 2015-04-07 DIAGNOSIS — R1310 Dysphagia, unspecified: Secondary | ICD-10-CM | POA: Diagnosis not present

## 2015-04-07 DIAGNOSIS — I6523 Occlusion and stenosis of bilateral carotid arteries: Secondary | ICD-10-CM | POA: Diagnosis not present

## 2015-04-07 DIAGNOSIS — R29898 Other symptoms and signs involving the musculoskeletal system: Secondary | ICD-10-CM | POA: Diagnosis not present

## 2015-04-07 DIAGNOSIS — N184 Chronic kidney disease, stage 4 (severe): Secondary | ICD-10-CM | POA: Diagnosis not present

## 2015-04-07 DIAGNOSIS — J449 Chronic obstructive pulmonary disease, unspecified: Secondary | ICD-10-CM | POA: Diagnosis not present

## 2015-04-07 DIAGNOSIS — I130 Hypertensive heart and chronic kidney disease with heart failure and stage 1 through stage 4 chronic kidney disease, or unspecified chronic kidney disease: Secondary | ICD-10-CM | POA: Diagnosis not present

## 2015-04-07 DIAGNOSIS — M880 Osteitis deformans of skull: Secondary | ICD-10-CM | POA: Diagnosis not present

## 2015-04-07 DIAGNOSIS — E1140 Type 2 diabetes mellitus with diabetic neuropathy, unspecified: Secondary | ICD-10-CM | POA: Diagnosis not present

## 2015-04-07 DIAGNOSIS — E1122 Type 2 diabetes mellitus with diabetic chronic kidney disease: Secondary | ICD-10-CM | POA: Diagnosis not present

## 2015-04-07 DIAGNOSIS — E1151 Type 2 diabetes mellitus with diabetic peripheral angiopathy without gangrene: Secondary | ICD-10-CM | POA: Diagnosis not present

## 2015-04-07 DIAGNOSIS — R2689 Other abnormalities of gait and mobility: Secondary | ICD-10-CM | POA: Diagnosis not present

## 2015-04-16 DIAGNOSIS — E1151 Type 2 diabetes mellitus with diabetic peripheral angiopathy without gangrene: Secondary | ICD-10-CM | POA: Diagnosis not present

## 2015-04-16 DIAGNOSIS — N184 Chronic kidney disease, stage 4 (severe): Secondary | ICD-10-CM | POA: Diagnosis not present

## 2015-04-16 DIAGNOSIS — R29898 Other symptoms and signs involving the musculoskeletal system: Secondary | ICD-10-CM | POA: Diagnosis not present

## 2015-04-16 DIAGNOSIS — I130 Hypertensive heart and chronic kidney disease with heart failure and stage 1 through stage 4 chronic kidney disease, or unspecified chronic kidney disease: Secondary | ICD-10-CM | POA: Diagnosis not present

## 2015-04-16 DIAGNOSIS — E1140 Type 2 diabetes mellitus with diabetic neuropathy, unspecified: Secondary | ICD-10-CM | POA: Diagnosis not present

## 2015-04-16 DIAGNOSIS — I6523 Occlusion and stenosis of bilateral carotid arteries: Secondary | ICD-10-CM | POA: Diagnosis not present

## 2015-04-16 DIAGNOSIS — R2689 Other abnormalities of gait and mobility: Secondary | ICD-10-CM | POA: Diagnosis not present

## 2015-04-16 DIAGNOSIS — I509 Heart failure, unspecified: Secondary | ICD-10-CM | POA: Diagnosis not present

## 2015-04-16 DIAGNOSIS — E1122 Type 2 diabetes mellitus with diabetic chronic kidney disease: Secondary | ICD-10-CM | POA: Diagnosis not present

## 2015-04-16 DIAGNOSIS — J449 Chronic obstructive pulmonary disease, unspecified: Secondary | ICD-10-CM | POA: Diagnosis not present

## 2015-04-16 DIAGNOSIS — M880 Osteitis deformans of skull: Secondary | ICD-10-CM | POA: Diagnosis not present

## 2015-04-16 DIAGNOSIS — R1310 Dysphagia, unspecified: Secondary | ICD-10-CM | POA: Diagnosis not present

## 2015-04-19 DIAGNOSIS — M880 Osteitis deformans of skull: Secondary | ICD-10-CM | POA: Diagnosis not present

## 2015-04-19 DIAGNOSIS — I509 Heart failure, unspecified: Secondary | ICD-10-CM | POA: Diagnosis not present

## 2015-04-19 DIAGNOSIS — J449 Chronic obstructive pulmonary disease, unspecified: Secondary | ICD-10-CM | POA: Diagnosis not present

## 2015-04-19 DIAGNOSIS — I130 Hypertensive heart and chronic kidney disease with heart failure and stage 1 through stage 4 chronic kidney disease, or unspecified chronic kidney disease: Secondary | ICD-10-CM | POA: Diagnosis not present

## 2015-04-19 DIAGNOSIS — E1122 Type 2 diabetes mellitus with diabetic chronic kidney disease: Secondary | ICD-10-CM | POA: Diagnosis not present

## 2015-04-19 DIAGNOSIS — E1151 Type 2 diabetes mellitus with diabetic peripheral angiopathy without gangrene: Secondary | ICD-10-CM | POA: Diagnosis not present

## 2015-04-19 DIAGNOSIS — N184 Chronic kidney disease, stage 4 (severe): Secondary | ICD-10-CM | POA: Diagnosis not present

## 2015-04-19 DIAGNOSIS — R2689 Other abnormalities of gait and mobility: Secondary | ICD-10-CM | POA: Diagnosis not present

## 2015-04-19 DIAGNOSIS — R1310 Dysphagia, unspecified: Secondary | ICD-10-CM | POA: Diagnosis not present

## 2015-04-19 DIAGNOSIS — R29898 Other symptoms and signs involving the musculoskeletal system: Secondary | ICD-10-CM | POA: Diagnosis not present

## 2015-04-19 DIAGNOSIS — I6523 Occlusion and stenosis of bilateral carotid arteries: Secondary | ICD-10-CM | POA: Diagnosis not present

## 2015-04-19 DIAGNOSIS — E1140 Type 2 diabetes mellitus with diabetic neuropathy, unspecified: Secondary | ICD-10-CM | POA: Diagnosis not present

## 2015-04-22 DIAGNOSIS — I130 Hypertensive heart and chronic kidney disease with heart failure and stage 1 through stage 4 chronic kidney disease, or unspecified chronic kidney disease: Secondary | ICD-10-CM | POA: Diagnosis not present

## 2015-04-22 DIAGNOSIS — N184 Chronic kidney disease, stage 4 (severe): Secondary | ICD-10-CM | POA: Diagnosis not present

## 2015-04-22 DIAGNOSIS — E1151 Type 2 diabetes mellitus with diabetic peripheral angiopathy without gangrene: Secondary | ICD-10-CM | POA: Diagnosis not present

## 2015-04-22 DIAGNOSIS — R29898 Other symptoms and signs involving the musculoskeletal system: Secondary | ICD-10-CM | POA: Diagnosis not present

## 2015-04-22 DIAGNOSIS — R1310 Dysphagia, unspecified: Secondary | ICD-10-CM | POA: Diagnosis not present

## 2015-04-22 DIAGNOSIS — M880 Osteitis deformans of skull: Secondary | ICD-10-CM | POA: Diagnosis not present

## 2015-04-22 DIAGNOSIS — J449 Chronic obstructive pulmonary disease, unspecified: Secondary | ICD-10-CM | POA: Diagnosis not present

## 2015-04-22 DIAGNOSIS — R2689 Other abnormalities of gait and mobility: Secondary | ICD-10-CM | POA: Diagnosis not present

## 2015-04-22 DIAGNOSIS — E1140 Type 2 diabetes mellitus with diabetic neuropathy, unspecified: Secondary | ICD-10-CM | POA: Diagnosis not present

## 2015-04-22 DIAGNOSIS — I6523 Occlusion and stenosis of bilateral carotid arteries: Secondary | ICD-10-CM | POA: Diagnosis not present

## 2015-04-22 DIAGNOSIS — I509 Heart failure, unspecified: Secondary | ICD-10-CM | POA: Diagnosis not present

## 2015-04-22 DIAGNOSIS — E1122 Type 2 diabetes mellitus with diabetic chronic kidney disease: Secondary | ICD-10-CM | POA: Diagnosis not present

## 2015-04-28 ENCOUNTER — Encounter: Payer: Self-pay | Admitting: Cardiology

## 2015-04-28 DIAGNOSIS — J449 Chronic obstructive pulmonary disease, unspecified: Secondary | ICD-10-CM | POA: Diagnosis not present

## 2015-04-28 DIAGNOSIS — I509 Heart failure, unspecified: Secondary | ICD-10-CM | POA: Diagnosis not present

## 2015-04-28 DIAGNOSIS — I2510 Atherosclerotic heart disease of native coronary artery without angina pectoris: Secondary | ICD-10-CM | POA: Diagnosis not present

## 2015-04-28 DIAGNOSIS — Z89422 Acquired absence of other left toe(s): Secondary | ICD-10-CM | POA: Diagnosis not present

## 2015-04-28 DIAGNOSIS — R1310 Dysphagia, unspecified: Secondary | ICD-10-CM | POA: Diagnosis not present

## 2015-04-28 DIAGNOSIS — Z89412 Acquired absence of left great toe: Secondary | ICD-10-CM | POA: Diagnosis not present

## 2015-04-28 DIAGNOSIS — Z89431 Acquired absence of right foot: Secondary | ICD-10-CM | POA: Diagnosis not present

## 2015-04-28 DIAGNOSIS — R2689 Other abnormalities of gait and mobility: Secondary | ICD-10-CM | POA: Diagnosis not present

## 2015-04-28 DIAGNOSIS — E1151 Type 2 diabetes mellitus with diabetic peripheral angiopathy without gangrene: Secondary | ICD-10-CM | POA: Diagnosis not present

## 2015-04-28 DIAGNOSIS — N184 Chronic kidney disease, stage 4 (severe): Secondary | ICD-10-CM | POA: Diagnosis not present

## 2015-04-28 DIAGNOSIS — E1122 Type 2 diabetes mellitus with diabetic chronic kidney disease: Secondary | ICD-10-CM | POA: Diagnosis not present

## 2015-04-28 DIAGNOSIS — I130 Hypertensive heart and chronic kidney disease with heart failure and stage 1 through stage 4 chronic kidney disease, or unspecified chronic kidney disease: Secondary | ICD-10-CM | POA: Diagnosis not present

## 2015-04-28 DIAGNOSIS — E1140 Type 2 diabetes mellitus with diabetic neuropathy, unspecified: Secondary | ICD-10-CM | POA: Diagnosis not present

## 2015-04-29 DIAGNOSIS — Z89431 Acquired absence of right foot: Secondary | ICD-10-CM | POA: Diagnosis not present

## 2015-04-29 DIAGNOSIS — Z89412 Acquired absence of left great toe: Secondary | ICD-10-CM | POA: Diagnosis not present

## 2015-04-29 DIAGNOSIS — R2689 Other abnormalities of gait and mobility: Secondary | ICD-10-CM | POA: Diagnosis not present

## 2015-04-29 DIAGNOSIS — E1140 Type 2 diabetes mellitus with diabetic neuropathy, unspecified: Secondary | ICD-10-CM | POA: Diagnosis not present

## 2015-04-29 DIAGNOSIS — I509 Heart failure, unspecified: Secondary | ICD-10-CM | POA: Diagnosis not present

## 2015-04-29 DIAGNOSIS — I130 Hypertensive heart and chronic kidney disease with heart failure and stage 1 through stage 4 chronic kidney disease, or unspecified chronic kidney disease: Secondary | ICD-10-CM | POA: Diagnosis not present

## 2015-04-29 DIAGNOSIS — N184 Chronic kidney disease, stage 4 (severe): Secondary | ICD-10-CM | POA: Diagnosis not present

## 2015-04-29 DIAGNOSIS — Z89422 Acquired absence of other left toe(s): Secondary | ICD-10-CM | POA: Diagnosis not present

## 2015-04-29 DIAGNOSIS — I2510 Atherosclerotic heart disease of native coronary artery without angina pectoris: Secondary | ICD-10-CM | POA: Diagnosis not present

## 2015-04-29 DIAGNOSIS — R29898 Other symptoms and signs involving the musculoskeletal system: Secondary | ICD-10-CM | POA: Diagnosis not present

## 2015-04-29 DIAGNOSIS — E1122 Type 2 diabetes mellitus with diabetic chronic kidney disease: Secondary | ICD-10-CM | POA: Diagnosis not present

## 2015-04-29 DIAGNOSIS — R1310 Dysphagia, unspecified: Secondary | ICD-10-CM | POA: Diagnosis not present

## 2015-04-29 DIAGNOSIS — J449 Chronic obstructive pulmonary disease, unspecified: Secondary | ICD-10-CM | POA: Diagnosis not present

## 2015-04-29 DIAGNOSIS — E1151 Type 2 diabetes mellitus with diabetic peripheral angiopathy without gangrene: Secondary | ICD-10-CM | POA: Diagnosis not present

## 2015-05-04 DIAGNOSIS — Z89412 Acquired absence of left great toe: Secondary | ICD-10-CM | POA: Diagnosis not present

## 2015-05-04 DIAGNOSIS — Z89422 Acquired absence of other left toe(s): Secondary | ICD-10-CM | POA: Diagnosis not present

## 2015-05-04 DIAGNOSIS — I130 Hypertensive heart and chronic kidney disease with heart failure and stage 1 through stage 4 chronic kidney disease, or unspecified chronic kidney disease: Secondary | ICD-10-CM | POA: Diagnosis not present

## 2015-05-04 DIAGNOSIS — N184 Chronic kidney disease, stage 4 (severe): Secondary | ICD-10-CM | POA: Diagnosis not present

## 2015-05-04 DIAGNOSIS — J449 Chronic obstructive pulmonary disease, unspecified: Secondary | ICD-10-CM | POA: Diagnosis not present

## 2015-05-04 DIAGNOSIS — E1151 Type 2 diabetes mellitus with diabetic peripheral angiopathy without gangrene: Secondary | ICD-10-CM | POA: Diagnosis not present

## 2015-05-04 DIAGNOSIS — E1140 Type 2 diabetes mellitus with diabetic neuropathy, unspecified: Secondary | ICD-10-CM | POA: Diagnosis not present

## 2015-05-04 DIAGNOSIS — R1310 Dysphagia, unspecified: Secondary | ICD-10-CM | POA: Diagnosis not present

## 2015-05-04 DIAGNOSIS — I2510 Atherosclerotic heart disease of native coronary artery without angina pectoris: Secondary | ICD-10-CM | POA: Diagnosis not present

## 2015-05-04 DIAGNOSIS — I509 Heart failure, unspecified: Secondary | ICD-10-CM | POA: Diagnosis not present

## 2015-05-04 DIAGNOSIS — Z89431 Acquired absence of right foot: Secondary | ICD-10-CM | POA: Diagnosis not present

## 2015-05-04 DIAGNOSIS — R2689 Other abnormalities of gait and mobility: Secondary | ICD-10-CM | POA: Diagnosis not present

## 2015-05-04 DIAGNOSIS — E1122 Type 2 diabetes mellitus with diabetic chronic kidney disease: Secondary | ICD-10-CM | POA: Diagnosis not present

## 2015-05-06 DIAGNOSIS — I130 Hypertensive heart and chronic kidney disease with heart failure and stage 1 through stage 4 chronic kidney disease, or unspecified chronic kidney disease: Secondary | ICD-10-CM | POA: Diagnosis not present

## 2015-05-06 DIAGNOSIS — E1122 Type 2 diabetes mellitus with diabetic chronic kidney disease: Secondary | ICD-10-CM | POA: Diagnosis not present

## 2015-05-06 DIAGNOSIS — I509 Heart failure, unspecified: Secondary | ICD-10-CM | POA: Diagnosis not present

## 2015-05-06 DIAGNOSIS — E1151 Type 2 diabetes mellitus with diabetic peripheral angiopathy without gangrene: Secondary | ICD-10-CM | POA: Diagnosis not present

## 2015-05-06 DIAGNOSIS — Z89412 Acquired absence of left great toe: Secondary | ICD-10-CM | POA: Diagnosis not present

## 2015-05-06 DIAGNOSIS — R2689 Other abnormalities of gait and mobility: Secondary | ICD-10-CM | POA: Diagnosis not present

## 2015-05-06 DIAGNOSIS — N184 Chronic kidney disease, stage 4 (severe): Secondary | ICD-10-CM | POA: Diagnosis not present

## 2015-05-06 DIAGNOSIS — I2510 Atherosclerotic heart disease of native coronary artery without angina pectoris: Secondary | ICD-10-CM | POA: Diagnosis not present

## 2015-05-06 DIAGNOSIS — E1140 Type 2 diabetes mellitus with diabetic neuropathy, unspecified: Secondary | ICD-10-CM | POA: Diagnosis not present

## 2015-05-06 DIAGNOSIS — R1310 Dysphagia, unspecified: Secondary | ICD-10-CM | POA: Diagnosis not present

## 2015-05-06 DIAGNOSIS — Z89431 Acquired absence of right foot: Secondary | ICD-10-CM | POA: Diagnosis not present

## 2015-05-06 DIAGNOSIS — J449 Chronic obstructive pulmonary disease, unspecified: Secondary | ICD-10-CM | POA: Diagnosis not present

## 2015-05-06 DIAGNOSIS — Z89422 Acquired absence of other left toe(s): Secondary | ICD-10-CM | POA: Diagnosis not present

## 2015-05-12 DIAGNOSIS — R1310 Dysphagia, unspecified: Secondary | ICD-10-CM | POA: Diagnosis not present

## 2015-05-12 DIAGNOSIS — J449 Chronic obstructive pulmonary disease, unspecified: Secondary | ICD-10-CM | POA: Diagnosis not present

## 2015-05-12 DIAGNOSIS — Z89412 Acquired absence of left great toe: Secondary | ICD-10-CM | POA: Diagnosis not present

## 2015-05-12 DIAGNOSIS — E1151 Type 2 diabetes mellitus with diabetic peripheral angiopathy without gangrene: Secondary | ICD-10-CM | POA: Diagnosis not present

## 2015-05-12 DIAGNOSIS — E1122 Type 2 diabetes mellitus with diabetic chronic kidney disease: Secondary | ICD-10-CM | POA: Diagnosis not present

## 2015-05-12 DIAGNOSIS — I509 Heart failure, unspecified: Secondary | ICD-10-CM | POA: Diagnosis not present

## 2015-05-12 DIAGNOSIS — Z89422 Acquired absence of other left toe(s): Secondary | ICD-10-CM | POA: Diagnosis not present

## 2015-05-12 DIAGNOSIS — I2510 Atherosclerotic heart disease of native coronary artery without angina pectoris: Secondary | ICD-10-CM | POA: Diagnosis not present

## 2015-05-12 DIAGNOSIS — R2689 Other abnormalities of gait and mobility: Secondary | ICD-10-CM | POA: Diagnosis not present

## 2015-05-12 DIAGNOSIS — I130 Hypertensive heart and chronic kidney disease with heart failure and stage 1 through stage 4 chronic kidney disease, or unspecified chronic kidney disease: Secondary | ICD-10-CM | POA: Diagnosis not present

## 2015-05-12 DIAGNOSIS — N184 Chronic kidney disease, stage 4 (severe): Secondary | ICD-10-CM | POA: Diagnosis not present

## 2015-05-12 DIAGNOSIS — Z89431 Acquired absence of right foot: Secondary | ICD-10-CM | POA: Diagnosis not present

## 2015-05-12 DIAGNOSIS — E1140 Type 2 diabetes mellitus with diabetic neuropathy, unspecified: Secondary | ICD-10-CM | POA: Diagnosis not present

## 2015-05-13 DIAGNOSIS — Z89431 Acquired absence of right foot: Secondary | ICD-10-CM | POA: Diagnosis not present

## 2015-05-13 DIAGNOSIS — Z89412 Acquired absence of left great toe: Secondary | ICD-10-CM | POA: Diagnosis not present

## 2015-05-13 DIAGNOSIS — Z89422 Acquired absence of other left toe(s): Secondary | ICD-10-CM | POA: Diagnosis not present

## 2015-05-13 DIAGNOSIS — E1122 Type 2 diabetes mellitus with diabetic chronic kidney disease: Secondary | ICD-10-CM | POA: Diagnosis not present

## 2015-05-13 DIAGNOSIS — I2510 Atherosclerotic heart disease of native coronary artery without angina pectoris: Secondary | ICD-10-CM | POA: Diagnosis not present

## 2015-05-13 DIAGNOSIS — I130 Hypertensive heart and chronic kidney disease with heart failure and stage 1 through stage 4 chronic kidney disease, or unspecified chronic kidney disease: Secondary | ICD-10-CM | POA: Diagnosis not present

## 2015-05-13 DIAGNOSIS — R1310 Dysphagia, unspecified: Secondary | ICD-10-CM | POA: Diagnosis not present

## 2015-05-13 DIAGNOSIS — J449 Chronic obstructive pulmonary disease, unspecified: Secondary | ICD-10-CM | POA: Diagnosis not present

## 2015-05-13 DIAGNOSIS — I509 Heart failure, unspecified: Secondary | ICD-10-CM | POA: Diagnosis not present

## 2015-05-13 DIAGNOSIS — E1151 Type 2 diabetes mellitus with diabetic peripheral angiopathy without gangrene: Secondary | ICD-10-CM | POA: Diagnosis not present

## 2015-05-13 DIAGNOSIS — N184 Chronic kidney disease, stage 4 (severe): Secondary | ICD-10-CM | POA: Diagnosis not present

## 2015-05-13 DIAGNOSIS — E1140 Type 2 diabetes mellitus with diabetic neuropathy, unspecified: Secondary | ICD-10-CM | POA: Diagnosis not present

## 2015-05-13 DIAGNOSIS — R2689 Other abnormalities of gait and mobility: Secondary | ICD-10-CM | POA: Diagnosis not present

## 2015-05-18 DIAGNOSIS — R1310 Dysphagia, unspecified: Secondary | ICD-10-CM | POA: Diagnosis not present

## 2015-05-18 DIAGNOSIS — E1140 Type 2 diabetes mellitus with diabetic neuropathy, unspecified: Secondary | ICD-10-CM | POA: Diagnosis not present

## 2015-05-18 DIAGNOSIS — N184 Chronic kidney disease, stage 4 (severe): Secondary | ICD-10-CM | POA: Diagnosis not present

## 2015-05-18 DIAGNOSIS — E1122 Type 2 diabetes mellitus with diabetic chronic kidney disease: Secondary | ICD-10-CM | POA: Diagnosis not present

## 2015-05-18 DIAGNOSIS — J449 Chronic obstructive pulmonary disease, unspecified: Secondary | ICD-10-CM | POA: Diagnosis not present

## 2015-05-18 DIAGNOSIS — I509 Heart failure, unspecified: Secondary | ICD-10-CM | POA: Diagnosis not present

## 2015-05-18 DIAGNOSIS — Z89422 Acquired absence of other left toe(s): Secondary | ICD-10-CM | POA: Diagnosis not present

## 2015-05-18 DIAGNOSIS — Z89412 Acquired absence of left great toe: Secondary | ICD-10-CM | POA: Diagnosis not present

## 2015-05-18 DIAGNOSIS — E1151 Type 2 diabetes mellitus with diabetic peripheral angiopathy without gangrene: Secondary | ICD-10-CM | POA: Diagnosis not present

## 2015-05-18 DIAGNOSIS — I130 Hypertensive heart and chronic kidney disease with heart failure and stage 1 through stage 4 chronic kidney disease, or unspecified chronic kidney disease: Secondary | ICD-10-CM | POA: Diagnosis not present

## 2015-05-18 DIAGNOSIS — I2510 Atherosclerotic heart disease of native coronary artery without angina pectoris: Secondary | ICD-10-CM | POA: Diagnosis not present

## 2015-05-18 DIAGNOSIS — Z89431 Acquired absence of right foot: Secondary | ICD-10-CM | POA: Diagnosis not present

## 2015-05-18 DIAGNOSIS — R2689 Other abnormalities of gait and mobility: Secondary | ICD-10-CM | POA: Diagnosis not present

## 2015-05-19 DIAGNOSIS — E1151 Type 2 diabetes mellitus with diabetic peripheral angiopathy without gangrene: Secondary | ICD-10-CM | POA: Diagnosis not present

## 2015-05-19 DIAGNOSIS — I2510 Atherosclerotic heart disease of native coronary artery without angina pectoris: Secondary | ICD-10-CM | POA: Diagnosis not present

## 2015-05-19 DIAGNOSIS — R2689 Other abnormalities of gait and mobility: Secondary | ICD-10-CM | POA: Diagnosis not present

## 2015-05-19 DIAGNOSIS — J449 Chronic obstructive pulmonary disease, unspecified: Secondary | ICD-10-CM | POA: Diagnosis not present

## 2015-05-19 DIAGNOSIS — Z89431 Acquired absence of right foot: Secondary | ICD-10-CM | POA: Diagnosis not present

## 2015-05-19 DIAGNOSIS — R1310 Dysphagia, unspecified: Secondary | ICD-10-CM | POA: Diagnosis not present

## 2015-05-19 DIAGNOSIS — E1140 Type 2 diabetes mellitus with diabetic neuropathy, unspecified: Secondary | ICD-10-CM | POA: Diagnosis not present

## 2015-05-19 DIAGNOSIS — Z89412 Acquired absence of left great toe: Secondary | ICD-10-CM | POA: Diagnosis not present

## 2015-05-19 DIAGNOSIS — Z89422 Acquired absence of other left toe(s): Secondary | ICD-10-CM | POA: Diagnosis not present

## 2015-05-19 DIAGNOSIS — I130 Hypertensive heart and chronic kidney disease with heart failure and stage 1 through stage 4 chronic kidney disease, or unspecified chronic kidney disease: Secondary | ICD-10-CM | POA: Diagnosis not present

## 2015-05-19 DIAGNOSIS — N184 Chronic kidney disease, stage 4 (severe): Secondary | ICD-10-CM | POA: Diagnosis not present

## 2015-05-19 DIAGNOSIS — I509 Heart failure, unspecified: Secondary | ICD-10-CM | POA: Diagnosis not present

## 2015-05-19 DIAGNOSIS — E1122 Type 2 diabetes mellitus with diabetic chronic kidney disease: Secondary | ICD-10-CM | POA: Diagnosis not present

## 2015-05-24 DIAGNOSIS — Z89431 Acquired absence of right foot: Secondary | ICD-10-CM | POA: Diagnosis not present

## 2015-05-24 DIAGNOSIS — E1122 Type 2 diabetes mellitus with diabetic chronic kidney disease: Secondary | ICD-10-CM | POA: Diagnosis not present

## 2015-05-24 DIAGNOSIS — I509 Heart failure, unspecified: Secondary | ICD-10-CM | POA: Diagnosis not present

## 2015-05-24 DIAGNOSIS — N184 Chronic kidney disease, stage 4 (severe): Secondary | ICD-10-CM | POA: Diagnosis not present

## 2015-05-24 DIAGNOSIS — Z89422 Acquired absence of other left toe(s): Secondary | ICD-10-CM | POA: Diagnosis not present

## 2015-05-24 DIAGNOSIS — R2689 Other abnormalities of gait and mobility: Secondary | ICD-10-CM | POA: Diagnosis not present

## 2015-05-24 DIAGNOSIS — Z89412 Acquired absence of left great toe: Secondary | ICD-10-CM | POA: Diagnosis not present

## 2015-05-24 DIAGNOSIS — R1310 Dysphagia, unspecified: Secondary | ICD-10-CM | POA: Diagnosis not present

## 2015-05-24 DIAGNOSIS — E1140 Type 2 diabetes mellitus with diabetic neuropathy, unspecified: Secondary | ICD-10-CM | POA: Diagnosis not present

## 2015-05-24 DIAGNOSIS — I2510 Atherosclerotic heart disease of native coronary artery without angina pectoris: Secondary | ICD-10-CM | POA: Diagnosis not present

## 2015-05-24 DIAGNOSIS — E1151 Type 2 diabetes mellitus with diabetic peripheral angiopathy without gangrene: Secondary | ICD-10-CM | POA: Diagnosis not present

## 2015-05-24 DIAGNOSIS — I130 Hypertensive heart and chronic kidney disease with heart failure and stage 1 through stage 4 chronic kidney disease, or unspecified chronic kidney disease: Secondary | ICD-10-CM | POA: Diagnosis not present

## 2015-05-24 DIAGNOSIS — J449 Chronic obstructive pulmonary disease, unspecified: Secondary | ICD-10-CM | POA: Diagnosis not present

## 2015-05-26 DIAGNOSIS — E1140 Type 2 diabetes mellitus with diabetic neuropathy, unspecified: Secondary | ICD-10-CM | POA: Diagnosis not present

## 2015-05-26 DIAGNOSIS — R1310 Dysphagia, unspecified: Secondary | ICD-10-CM | POA: Diagnosis not present

## 2015-05-26 DIAGNOSIS — Z89422 Acquired absence of other left toe(s): Secondary | ICD-10-CM | POA: Diagnosis not present

## 2015-05-26 DIAGNOSIS — N184 Chronic kidney disease, stage 4 (severe): Secondary | ICD-10-CM | POA: Diagnosis not present

## 2015-05-26 DIAGNOSIS — I2510 Atherosclerotic heart disease of native coronary artery without angina pectoris: Secondary | ICD-10-CM | POA: Diagnosis not present

## 2015-05-26 DIAGNOSIS — R2689 Other abnormalities of gait and mobility: Secondary | ICD-10-CM | POA: Diagnosis not present

## 2015-05-26 DIAGNOSIS — E1151 Type 2 diabetes mellitus with diabetic peripheral angiopathy without gangrene: Secondary | ICD-10-CM | POA: Diagnosis not present

## 2015-05-26 DIAGNOSIS — Z89412 Acquired absence of left great toe: Secondary | ICD-10-CM | POA: Diagnosis not present

## 2015-05-26 DIAGNOSIS — E1122 Type 2 diabetes mellitus with diabetic chronic kidney disease: Secondary | ICD-10-CM | POA: Diagnosis not present

## 2015-05-26 DIAGNOSIS — Z89431 Acquired absence of right foot: Secondary | ICD-10-CM | POA: Diagnosis not present

## 2015-05-26 DIAGNOSIS — I130 Hypertensive heart and chronic kidney disease with heart failure and stage 1 through stage 4 chronic kidney disease, or unspecified chronic kidney disease: Secondary | ICD-10-CM | POA: Diagnosis not present

## 2015-05-26 DIAGNOSIS — J449 Chronic obstructive pulmonary disease, unspecified: Secondary | ICD-10-CM | POA: Diagnosis not present

## 2015-05-26 DIAGNOSIS — I509 Heart failure, unspecified: Secondary | ICD-10-CM | POA: Diagnosis not present

## 2015-05-28 DIAGNOSIS — R1310 Dysphagia, unspecified: Secondary | ICD-10-CM | POA: Diagnosis not present

## 2015-05-28 DIAGNOSIS — I130 Hypertensive heart and chronic kidney disease with heart failure and stage 1 through stage 4 chronic kidney disease, or unspecified chronic kidney disease: Secondary | ICD-10-CM | POA: Diagnosis not present

## 2015-05-28 DIAGNOSIS — Z89431 Acquired absence of right foot: Secondary | ICD-10-CM | POA: Diagnosis not present

## 2015-05-28 DIAGNOSIS — E1140 Type 2 diabetes mellitus with diabetic neuropathy, unspecified: Secondary | ICD-10-CM | POA: Diagnosis not present

## 2015-05-28 DIAGNOSIS — I2510 Atherosclerotic heart disease of native coronary artery without angina pectoris: Secondary | ICD-10-CM | POA: Diagnosis not present

## 2015-05-28 DIAGNOSIS — Z89422 Acquired absence of other left toe(s): Secondary | ICD-10-CM | POA: Diagnosis not present

## 2015-05-28 DIAGNOSIS — E1151 Type 2 diabetes mellitus with diabetic peripheral angiopathy without gangrene: Secondary | ICD-10-CM | POA: Diagnosis not present

## 2015-05-28 DIAGNOSIS — R2689 Other abnormalities of gait and mobility: Secondary | ICD-10-CM | POA: Diagnosis not present

## 2015-05-28 DIAGNOSIS — Z89412 Acquired absence of left great toe: Secondary | ICD-10-CM | POA: Diagnosis not present

## 2015-05-28 DIAGNOSIS — I509 Heart failure, unspecified: Secondary | ICD-10-CM | POA: Diagnosis not present

## 2015-05-28 DIAGNOSIS — J449 Chronic obstructive pulmonary disease, unspecified: Secondary | ICD-10-CM | POA: Diagnosis not present

## 2015-05-28 DIAGNOSIS — E1122 Type 2 diabetes mellitus with diabetic chronic kidney disease: Secondary | ICD-10-CM | POA: Diagnosis not present

## 2015-05-28 DIAGNOSIS — N184 Chronic kidney disease, stage 4 (severe): Secondary | ICD-10-CM | POA: Diagnosis not present

## 2015-05-31 DIAGNOSIS — E1140 Type 2 diabetes mellitus with diabetic neuropathy, unspecified: Secondary | ICD-10-CM | POA: Diagnosis not present

## 2015-05-31 DIAGNOSIS — I509 Heart failure, unspecified: Secondary | ICD-10-CM | POA: Diagnosis not present

## 2015-05-31 DIAGNOSIS — I2510 Atherosclerotic heart disease of native coronary artery without angina pectoris: Secondary | ICD-10-CM | POA: Diagnosis not present

## 2015-05-31 DIAGNOSIS — R2689 Other abnormalities of gait and mobility: Secondary | ICD-10-CM | POA: Diagnosis not present

## 2015-05-31 DIAGNOSIS — Z89431 Acquired absence of right foot: Secondary | ICD-10-CM | POA: Diagnosis not present

## 2015-05-31 DIAGNOSIS — E1122 Type 2 diabetes mellitus with diabetic chronic kidney disease: Secondary | ICD-10-CM | POA: Diagnosis not present

## 2015-05-31 DIAGNOSIS — I130 Hypertensive heart and chronic kidney disease with heart failure and stage 1 through stage 4 chronic kidney disease, or unspecified chronic kidney disease: Secondary | ICD-10-CM | POA: Diagnosis not present

## 2015-05-31 DIAGNOSIS — E1151 Type 2 diabetes mellitus with diabetic peripheral angiopathy without gangrene: Secondary | ICD-10-CM | POA: Diagnosis not present

## 2015-05-31 DIAGNOSIS — R1310 Dysphagia, unspecified: Secondary | ICD-10-CM | POA: Diagnosis not present

## 2015-05-31 DIAGNOSIS — Z89422 Acquired absence of other left toe(s): Secondary | ICD-10-CM | POA: Diagnosis not present

## 2015-05-31 DIAGNOSIS — J449 Chronic obstructive pulmonary disease, unspecified: Secondary | ICD-10-CM | POA: Diagnosis not present

## 2015-05-31 DIAGNOSIS — Z89412 Acquired absence of left great toe: Secondary | ICD-10-CM | POA: Diagnosis not present

## 2015-05-31 DIAGNOSIS — N184 Chronic kidney disease, stage 4 (severe): Secondary | ICD-10-CM | POA: Diagnosis not present

## 2015-06-01 DIAGNOSIS — E1151 Type 2 diabetes mellitus with diabetic peripheral angiopathy without gangrene: Secondary | ICD-10-CM | POA: Diagnosis not present

## 2015-06-01 DIAGNOSIS — R2689 Other abnormalities of gait and mobility: Secondary | ICD-10-CM | POA: Diagnosis not present

## 2015-06-01 DIAGNOSIS — J449 Chronic obstructive pulmonary disease, unspecified: Secondary | ICD-10-CM | POA: Diagnosis not present

## 2015-06-01 DIAGNOSIS — Z89431 Acquired absence of right foot: Secondary | ICD-10-CM | POA: Diagnosis not present

## 2015-06-01 DIAGNOSIS — R1310 Dysphagia, unspecified: Secondary | ICD-10-CM | POA: Diagnosis not present

## 2015-06-01 DIAGNOSIS — I509 Heart failure, unspecified: Secondary | ICD-10-CM | POA: Diagnosis not present

## 2015-06-01 DIAGNOSIS — Z89422 Acquired absence of other left toe(s): Secondary | ICD-10-CM | POA: Diagnosis not present

## 2015-06-01 DIAGNOSIS — I2510 Atherosclerotic heart disease of native coronary artery without angina pectoris: Secondary | ICD-10-CM | POA: Diagnosis not present

## 2015-06-01 DIAGNOSIS — I130 Hypertensive heart and chronic kidney disease with heart failure and stage 1 through stage 4 chronic kidney disease, or unspecified chronic kidney disease: Secondary | ICD-10-CM | POA: Diagnosis not present

## 2015-06-01 DIAGNOSIS — E1122 Type 2 diabetes mellitus with diabetic chronic kidney disease: Secondary | ICD-10-CM | POA: Diagnosis not present

## 2015-06-01 DIAGNOSIS — N184 Chronic kidney disease, stage 4 (severe): Secondary | ICD-10-CM | POA: Diagnosis not present

## 2015-06-01 DIAGNOSIS — E1140 Type 2 diabetes mellitus with diabetic neuropathy, unspecified: Secondary | ICD-10-CM | POA: Diagnosis not present

## 2015-06-01 DIAGNOSIS — Z89412 Acquired absence of left great toe: Secondary | ICD-10-CM | POA: Diagnosis not present

## 2015-09-06 DIAGNOSIS — E1159 Type 2 diabetes mellitus with other circulatory complications: Secondary | ICD-10-CM | POA: Diagnosis not present

## 2015-09-08 DIAGNOSIS — E875 Hyperkalemia: Secondary | ICD-10-CM | POA: Diagnosis not present

## 2015-09-08 DIAGNOSIS — N189 Chronic kidney disease, unspecified: Secondary | ICD-10-CM | POA: Diagnosis not present

## 2015-09-08 DIAGNOSIS — R2689 Other abnormalities of gait and mobility: Secondary | ICD-10-CM | POA: Diagnosis not present

## 2015-09-08 DIAGNOSIS — R296 Repeated falls: Secondary | ICD-10-CM | POA: Diagnosis not present

## 2015-09-08 DIAGNOSIS — R300 Dysuria: Secondary | ICD-10-CM | POA: Diagnosis not present

## 2015-09-08 DIAGNOSIS — E1159 Type 2 diabetes mellitus with other circulatory complications: Secondary | ICD-10-CM | POA: Diagnosis not present

## 2015-09-08 DIAGNOSIS — D649 Anemia, unspecified: Secondary | ICD-10-CM | POA: Diagnosis not present

## 2015-09-17 ENCOUNTER — Encounter (HOSPITAL_COMMUNITY): Payer: Self-pay | Admitting: Family Medicine

## 2015-09-17 ENCOUNTER — Ambulatory Visit (INDEPENDENT_AMBULATORY_CARE_PROVIDER_SITE_OTHER): Payer: Medicare Other

## 2015-09-17 ENCOUNTER — Ambulatory Visit (HOSPITAL_COMMUNITY)
Admission: EM | Admit: 2015-09-17 | Discharge: 2015-09-17 | Disposition: A | Discharge status: Home / Self Care | Payer: Medicare Other | Attending: Family Medicine | Admitting: Family Medicine

## 2015-09-17 DIAGNOSIS — J069 Acute upper respiratory infection, unspecified: Secondary | ICD-10-CM

## 2015-09-17 DIAGNOSIS — R0602 Shortness of breath: Secondary | ICD-10-CM | POA: Diagnosis not present

## 2015-09-17 DIAGNOSIS — R079 Chest pain, unspecified: Secondary | ICD-10-CM | POA: Diagnosis not present

## 2015-09-17 DIAGNOSIS — R05 Cough: Secondary | ICD-10-CM | POA: Diagnosis not present

## 2015-09-17 NOTE — Discharge Instructions (Signed)
Use delsym for cough, drink plenty of water, see your doctor on mon as scheduled.

## 2015-09-17 NOTE — ED Provider Notes (Signed)
Colin [name]    CSN: [id number] Arrival date & time: 09/17/15  1724  First Provider Contact:  First MD Initiated Contact with Patient 09/17/15 1751        History   Chief Complaint Chief Complaint  Patient presents with  . Cough    HPI Colin [name] is a 82 y.o. male.   The history is provided by the patient.  Cough  Cough characteristics:  Non-productive, dry and hacking Severity:  Moderate Duration:  2 days Chronicity:  New Smoker: no   Context: upper respiratory infection   Relieved by:  None tried Worsened by:  Nothing Ineffective treatments:  None tried Associated symptoms: chest pain   Associated symptoms: no fever and no rhinorrhea     Past Medical History:  Diagnosis Date  . Anemia 10/29/2013  . Anginal pain ([place])   . Arthritis   . Cardiomyopathy   . CHF (congestive heart failure) ([place]) 2007   ef 38%  . Chronic renal insufficiency    creatinine around 2.4  . COPD (chronic obstructive pulmonary disease) ([place])   . Coronary artery disease    Status post CABG in 2004  . Dementia   . Diabetes mellitus with peripheral artery disease ([place])   . Dyslipidemia   . GERD (gastroesophageal reflux disease)   . History of anemia   . History of CVA (cerebrovascular accident)   . Hypertension   . Myocardial infarction ([place])   . Pacemaker   . Peripheral vascular disease ([place])    Status post right femoropopliteal bypass  . Pneumonia   . Shortness of breath     Patient Active Problem List   Diagnosis Date Noted  . Infection of pacemaker lead wire ([place]) 11/19/2013  . Acute endocarditis 11/06/2013  . Anemia, iron deficiency 11/06/2013  . Anemia of chronic disease 11/06/2013  . Acute blood loss anemia 10/29/2013  . Weakness generalized 10/20/2013  . Bacteremia 10/20/2013  . Fever of unknown origin (FUO) 10/19/2013  . Type 2 diabetes, uncontrolled, with renal manifestation ([place]) 06/03/2013  . PAD (peripheral artery disease) ([place]) 04/30/2013  .  Atherosclerosis of native arteries of the extremities with gangrene ([place]) 04/29/2013  . Other stiff joint, of the lower leg 12/31/2012  . Fall 07/01/2012  . Hypoglycemia 07/01/2012  . Hypothermia 07/01/2012  . Type 2 diabetes mellitus with hyperosmolar nonketotic hyperglycemia ([place]) 02/27/2012  . Failure to thrive in adult 02/27/2012  . Non-compliant patient 02/27/2012  . Microscopic hematuria 01/11/2012  . Syncope 01/09/2012  . CKD (chronic kidney disease), stage III 01/09/2012  . Diabetes mellitus with peripheral artery disease ([place])   . Pain in limb 12/29/2011  . Peripheral vascular disease, unspecified ([place]) 12/29/2011  . DOE (dyspnea on exertion) 04/10/2011  . Chronic systolic CHF (congestive heart failure) ([place])   . Peripheral vascular disease ([place])   . Dyslipidemia   . Acute kidney injury ([place])   . History of CVA (cerebrovascular accident)   . Cardiomyopathy   . COPD (chronic obstructive pulmonary disease) ([place])   . History of anemia   . Coronary atherosclerosis 11/30/2009  . Cardiac pacemaker in situ 11/30/2009    Past Surgical History:  Procedure Laterality Date  . ABDOMINAL ANGIOGRAM  04/30/2013   Procedure: ABDOMINAL ANGIOGRAM;  Surgeon: Mal Misty, MD;  Location: [facility] CATH LAB;  Service: Cardiovascular;;  . AMPUTATION Left 05/05/2013   Procedure: AMPUTATION RAY- FIRST AND SECOND-LEFT FOOT;  Surgeon: Newt Minion, MD;  Location: [place];  Service: Orthopedics;  Laterality:  Left;  . CARDIAC CATHETERIZATION  04/29/2002   Dr. Glade Lloyd  . CORONARY ANGIOPLASTY    . CORONARY ARTERY BYPASS GRAFT  4 of 2004   4 vessel bypass  . ESOPHAGOGASTRODUODENOSCOPY  07/21/2011   Procedure: ESOPHAGOGASTRODUODENOSCOPY (EGD);  Surgeon: Beryle Beams, MD;  Location: [facility];  Service: Endoscopy;  Laterality: N/A;  . FEMORAL BYPASS  july 2006   right with right great toe amputation  . FEMORAL-POPLITEAL BYPASS GRAFT Left 05/01/2013   Procedure: LEFT FEMORAL TO ABOVE KNEE POPLITEAL ARTERY  BYPASS GRAFT WITH INTRAOPERATIVE ARTEROGRAM;  Surgeon: Mal Misty, MD;  Location: [place];  Service: Vascular;  Laterality: Left;  . INSERT / REPLACE / REMOVE PACEMAKER  05/17/2005   st. jude dual chamber ddd mode   . LOWER EXTREMITY ANGIOGRAM N/A 04/30/2013   Procedure: LOWER EXTREMITY ANGIOGRAM;  Surgeon: Mal Misty, MD;  Location[facility] C[facility] LAB;  Service: Cardiovascular;  Laterality: N/A;  . PACEMAKER LEAD REMOVAL Right 10/27/2013   Procedure: PACEMAKER LEAD REMOVAL;  Surgeon: Evans Lance, MD;  Location: [place];  Service: Cardiovascular;  Laterality: Right;  Roxy Manns backing up  . TEE WITHOUT CARDIOVERSION N/A 10/23/2013   Procedure: TRANSESOPHAGEAL ECHOCARDIOGRAM (TEE);  Surgeon: Dorothy Spark, MD;  Location: [place];  Service: Cardiovascular;  Laterality: N/A;  . TEE WITHOUT CARDIOVERSION N/A 10/28/2013   Procedure: TRANSESOPHAGEAL ECHOCARDIOGRAM (TEE);  Surgeon: Dorothy Spark, MD;  Location[facility] ENDOSCOPY;  Service: Cardiovascular;  Laterality: N/A;       Home Medications    Prior to Admission medications   Medication Sig Start Date End Date Taking? Authorizing Provider  acetaminophen (TYLENOL) 325 MG tablet Take 2 tablets (650 mg total) by mouth every 6 (six) hours as needed for mild pain or fever. 10/31/13   Delfina Redwood, MD  aspirin EC 81 MG tablet Take 81 mg by mouth daily.    Historical Provider, MD  B Complex-C (B-COMPLEX WITH VITAMIN C) tablet Take 1 tablet by mouth daily.    Historical Provider, MD  carvedilol (COREG) 6.25 MG tablet TAKE 1 TABLET BY MOUTH TWICE A DAY 06/23/14   Sueanne Margarita, MD  dextrose 4 G chewable tablet Chew 16 g by mouth daily as needed for low blood sugar.    Historical Provider, MD  docusate sodium 100 MG CAPS Take 100 mg by mouth 2 (two) times daily. 10/31/13   Delfina Redwood, MD  ezetimibe (ZETIA) 10 MG tablet Take 10 mg by mouth daily. Reported on 02/20/2015    Historical Provider, MD  ferrous sulfate 325 (65 FE) MG tablet Take 1  tablet (325 mg total) by mouth daily with breakfast. 10/31/13   Delfina Redwood, MD  furosemide (LASIX) 40 MG tablet TAKE 1 TABLET BY MOUTH EVERY DAY Patient not taking: Reported on 02/20/2015 04/22/14   Evans Lance, MD  hydrALAZINE (APRESOLINE) 10 MG tablet Take 1 tablet (10 mg total) by mouth every 8 (eight) hours. 10/31/13   Delfina Redwood, MD  insulin aspart (NOVOLOG) 100 UNIT/ML injection Inject 3 Units into the skin 3 (three) times daily before meals. Reported on 02/20/2015    Historical Provider, MD  insulin glargine (LANTUS) 100 UNIT/ML injection Inject 0.1 mLs (10 Units total) into the skin at bedtime. 10/31/13   Delfina Redwood, MD  isosorbide mononitrate (IMDUR) 30 MG 24 hr tablet TAKE 1 TABLET BY MOUTH EVERY DAY 04/22/14   Evans Lance, MD  linagliptin (TRADJENTA) 5 MG TABS tablet Take 5 mg  by mouth daily. Reported on 02/20/2015    Historical Provider, MD  Omega-3 Fatty Acids (FISH OIL) 1000 MG CAPS Take 1,000 mg by mouth daily.     Historical Provider, MD  ondansetron (ZOFRAN) 4 MG tablet Take 1 tablet (4 mg total) by mouth every 6 (six) hours as needed for nausea. 10/31/13   Delfina Redwood, MD  vitamin B-12 (CYANOCOBALAMIN) 1000 MCG tablet Take 1,000 mcg by mouth daily.    Historical Provider, MD    Family History Family History  Problem Relation Age of Onset  . Hyperlipidemia Mother   . Heart disease Father   . Diabetes Sister   . Hypertension Sister   . Hyperlipidemia Sister   . Diabetes      Social History Social History  Substance Use Topics  . Smoking status: Former Smoker    Packs/day: 1.00    Years: 50.00    Types: Cigarettes    Quit date: 06/09/2002  . Smokeless tobacco: Never Used  . Alcohol use No     Allergies   Lisinopril   Review of Systems Review of Systems  Constitutional: Negative for fever.  HENT: Negative for rhinorrhea.   Respiratory: Positive for cough.   Cardiovascular: Positive for chest pain.  All other systems reviewed and  are negative.    Physical Exam Triage Vital Signs ED Triage Vitals  Enc Vitals Group     BP 09/17/15 1738 154/65     Pulse Rate 09/17/15 1738 68     Resp 09/17/15 1738 16     Temp 09/17/15 1738 99.5 F (37.5 C)     Temp Source 09/17/15 1738 Oral     SpO2 09/17/15 1738 100 %     Weight --      Height --      Head Circumference --      Peak Flow --      Pain Score 09/17/15 1743 5     Pain Loc --      Pain Edu? --      Excl. in [place]? --    No data found.   Updated Vital Signs BP 154/65 (BP Location: Left Arm)   Pulse 68   Temp 99.5 F (37.5 C) (Oral)   Resp 16   SpO2 100%   Visual Acuity Right Eye Distance:   Left Eye Distance:   Bilateral Distance:    Right Eye Near:   Left Eye Near:    Bilateral Near:     Physical Exam  Constitutional: He is oriented to person, place, and time. He appears well-developed and well-nourished. No distress.  HENT:  Mouth/Throat: Oropharynx is clear and moist.  Eyes: Pupils are equal, round, and reactive to light.  Neck: Normal range of motion. Neck supple.  Cardiovascular: Normal rate, regular rhythm, normal heart sounds and intact distal pulses.   Pulmonary/Chest: Effort normal and breath sounds normal. He exhibits tenderness.  Neurological: He is alert and oriented to person, place, and time.  Skin: Skin is warm and dry.  Nursing note and vitals reviewed.    UC Treatments / Results  Labs (all labs ordered are listed, but only abnormal results are displayed) Labs Reviewed - No data to display  EKG  EKG Interpretation None       Radiology No results found. X-rays reviewed and report per radiologist.  Procedures Procedures (including critical care time)  Medications Ordered in UC Medications - No data to display   Initial Impression / Assessment and Plan /  UC Course  I have reviewed the triage vital signs and the nursing notes.  Pertinent labs & imaging results that were available during my care of the  patient were reviewed by me and considered in my medical decision making (see chart for details).  Clinical Course      Final Clinical Impressions(s) / UC Diagnoses   Final diagnoses:  None    New Prescriptions New Prescriptions   No medications on file     Billy Fischer, MD 09/17/15 1845

## 2015-09-17 NOTE — ED Triage Notes (Signed)
Pt here for right sided chest pain with breathing and productive cough.

## 2015-09-21 DIAGNOSIS — N184 Chronic kidney disease, stage 4 (severe): Secondary | ICD-10-CM | POA: Diagnosis not present

## 2015-09-21 DIAGNOSIS — D631 Anemia in chronic kidney disease: Secondary | ICD-10-CM | POA: Diagnosis not present

## 2015-09-22 DIAGNOSIS — N184 Chronic kidney disease, stage 4 (severe): Secondary | ICD-10-CM | POA: Diagnosis not present

## 2015-09-22 DIAGNOSIS — Z951 Presence of aortocoronary bypass graft: Secondary | ICD-10-CM | POA: Diagnosis not present

## 2015-09-22 DIAGNOSIS — R2689 Other abnormalities of gait and mobility: Secondary | ICD-10-CM | POA: Diagnosis not present

## 2015-09-22 DIAGNOSIS — J449 Chronic obstructive pulmonary disease, unspecified: Secondary | ICD-10-CM | POA: Diagnosis not present

## 2015-09-22 DIAGNOSIS — Z950 Presence of cardiac pacemaker: Secondary | ICD-10-CM | POA: Diagnosis not present

## 2015-09-22 DIAGNOSIS — C61 Malignant neoplasm of prostate: Secondary | ICD-10-CM | POA: Diagnosis not present

## 2015-09-22 DIAGNOSIS — Z89412 Acquired absence of left great toe: Secondary | ICD-10-CM | POA: Diagnosis not present

## 2015-09-22 DIAGNOSIS — I2510 Atherosclerotic heart disease of native coronary artery without angina pectoris: Secondary | ICD-10-CM | POA: Diagnosis not present

## 2015-09-22 DIAGNOSIS — E1151 Type 2 diabetes mellitus with diabetic peripheral angiopathy without gangrene: Secondary | ICD-10-CM | POA: Diagnosis not present

## 2015-09-22 DIAGNOSIS — E1122 Type 2 diabetes mellitus with diabetic chronic kidney disease: Secondary | ICD-10-CM | POA: Diagnosis not present

## 2015-09-22 DIAGNOSIS — D649 Anemia, unspecified: Secondary | ICD-10-CM | POA: Diagnosis not present

## 2015-09-22 DIAGNOSIS — I10 Essential (primary) hypertension: Secondary | ICD-10-CM | POA: Diagnosis not present

## 2015-09-22 DIAGNOSIS — R531 Weakness: Secondary | ICD-10-CM | POA: Diagnosis not present

## 2015-09-28 DIAGNOSIS — Z951 Presence of aortocoronary bypass graft: Secondary | ICD-10-CM | POA: Diagnosis not present

## 2015-09-28 DIAGNOSIS — Z950 Presence of cardiac pacemaker: Secondary | ICD-10-CM | POA: Diagnosis not present

## 2015-09-28 DIAGNOSIS — R2689 Other abnormalities of gait and mobility: Secondary | ICD-10-CM | POA: Diagnosis not present

## 2015-09-28 DIAGNOSIS — I2510 Atherosclerotic heart disease of native coronary artery without angina pectoris: Secondary | ICD-10-CM | POA: Diagnosis not present

## 2015-09-28 DIAGNOSIS — C61 Malignant neoplasm of prostate: Secondary | ICD-10-CM | POA: Diagnosis not present

## 2015-09-28 DIAGNOSIS — E1122 Type 2 diabetes mellitus with diabetic chronic kidney disease: Secondary | ICD-10-CM | POA: Diagnosis not present

## 2015-09-28 DIAGNOSIS — I10 Essential (primary) hypertension: Secondary | ICD-10-CM | POA: Diagnosis not present

## 2015-09-28 DIAGNOSIS — N184 Chronic kidney disease, stage 4 (severe): Secondary | ICD-10-CM | POA: Diagnosis not present

## 2015-09-28 DIAGNOSIS — D649 Anemia, unspecified: Secondary | ICD-10-CM | POA: Diagnosis not present

## 2015-09-28 DIAGNOSIS — R531 Weakness: Secondary | ICD-10-CM | POA: Diagnosis not present

## 2015-09-28 DIAGNOSIS — E1151 Type 2 diabetes mellitus with diabetic peripheral angiopathy without gangrene: Secondary | ICD-10-CM | POA: Diagnosis not present

## 2015-09-28 DIAGNOSIS — J449 Chronic obstructive pulmonary disease, unspecified: Secondary | ICD-10-CM | POA: Diagnosis not present

## 2015-09-28 DIAGNOSIS — Z89412 Acquired absence of left great toe: Secondary | ICD-10-CM | POA: Diagnosis not present

## 2015-09-29 DIAGNOSIS — R531 Weakness: Secondary | ICD-10-CM | POA: Diagnosis not present

## 2015-09-29 DIAGNOSIS — E1122 Type 2 diabetes mellitus with diabetic chronic kidney disease: Secondary | ICD-10-CM | POA: Diagnosis not present

## 2015-09-29 DIAGNOSIS — I2510 Atherosclerotic heart disease of native coronary artery without angina pectoris: Secondary | ICD-10-CM | POA: Diagnosis not present

## 2015-09-29 DIAGNOSIS — I509 Heart failure, unspecified: Secondary | ICD-10-CM | POA: Diagnosis not present

## 2015-09-29 DIAGNOSIS — R2689 Other abnormalities of gait and mobility: Secondary | ICD-10-CM | POA: Diagnosis not present

## 2015-09-29 DIAGNOSIS — N184 Chronic kidney disease, stage 4 (severe): Secondary | ICD-10-CM | POA: Diagnosis not present

## 2015-09-29 DIAGNOSIS — Z950 Presence of cardiac pacemaker: Secondary | ICD-10-CM | POA: Diagnosis not present

## 2015-09-29 DIAGNOSIS — E1151 Type 2 diabetes mellitus with diabetic peripheral angiopathy without gangrene: Secondary | ICD-10-CM | POA: Diagnosis not present

## 2015-09-29 DIAGNOSIS — C61 Malignant neoplasm of prostate: Secondary | ICD-10-CM | POA: Diagnosis not present

## 2015-09-29 DIAGNOSIS — D649 Anemia, unspecified: Secondary | ICD-10-CM | POA: Diagnosis not present

## 2015-09-29 DIAGNOSIS — Z23 Encounter for immunization: Secondary | ICD-10-CM | POA: Diagnosis not present

## 2015-09-29 DIAGNOSIS — Z951 Presence of aortocoronary bypass graft: Secondary | ICD-10-CM | POA: Diagnosis not present

## 2015-09-29 DIAGNOSIS — I10 Essential (primary) hypertension: Secondary | ICD-10-CM | POA: Diagnosis not present

## 2015-09-29 DIAGNOSIS — E875 Hyperkalemia: Secondary | ICD-10-CM | POA: Diagnosis not present

## 2015-09-29 DIAGNOSIS — E1159 Type 2 diabetes mellitus with other circulatory complications: Secondary | ICD-10-CM | POA: Diagnosis not present

## 2015-09-29 DIAGNOSIS — J449 Chronic obstructive pulmonary disease, unspecified: Secondary | ICD-10-CM | POA: Diagnosis not present

## 2015-09-29 DIAGNOSIS — Z89412 Acquired absence of left great toe: Secondary | ICD-10-CM | POA: Diagnosis not present

## 2015-09-30 DIAGNOSIS — E1122 Type 2 diabetes mellitus with diabetic chronic kidney disease: Secondary | ICD-10-CM | POA: Diagnosis not present

## 2015-09-30 DIAGNOSIS — I2510 Atherosclerotic heart disease of native coronary artery without angina pectoris: Secondary | ICD-10-CM | POA: Diagnosis not present

## 2015-09-30 DIAGNOSIS — Z89412 Acquired absence of left great toe: Secondary | ICD-10-CM | POA: Diagnosis not present

## 2015-09-30 DIAGNOSIS — I10 Essential (primary) hypertension: Secondary | ICD-10-CM | POA: Diagnosis not present

## 2015-09-30 DIAGNOSIS — J449 Chronic obstructive pulmonary disease, unspecified: Secondary | ICD-10-CM | POA: Diagnosis not present

## 2015-09-30 DIAGNOSIS — Z950 Presence of cardiac pacemaker: Secondary | ICD-10-CM | POA: Diagnosis not present

## 2015-09-30 DIAGNOSIS — D649 Anemia, unspecified: Secondary | ICD-10-CM | POA: Diagnosis not present

## 2015-09-30 DIAGNOSIS — R2689 Other abnormalities of gait and mobility: Secondary | ICD-10-CM | POA: Diagnosis not present

## 2015-09-30 DIAGNOSIS — C61 Malignant neoplasm of prostate: Secondary | ICD-10-CM | POA: Diagnosis not present

## 2015-09-30 DIAGNOSIS — N184 Chronic kidney disease, stage 4 (severe): Secondary | ICD-10-CM | POA: Diagnosis not present

## 2015-09-30 DIAGNOSIS — E1151 Type 2 diabetes mellitus with diabetic peripheral angiopathy without gangrene: Secondary | ICD-10-CM | POA: Diagnosis not present

## 2015-09-30 DIAGNOSIS — Z951 Presence of aortocoronary bypass graft: Secondary | ICD-10-CM | POA: Diagnosis not present

## 2015-09-30 DIAGNOSIS — R531 Weakness: Secondary | ICD-10-CM | POA: Diagnosis not present

## 2015-10-01 DIAGNOSIS — N184 Chronic kidney disease, stage 4 (severe): Secondary | ICD-10-CM | POA: Diagnosis not present

## 2015-10-01 DIAGNOSIS — J449 Chronic obstructive pulmonary disease, unspecified: Secondary | ICD-10-CM | POA: Diagnosis not present

## 2015-10-01 DIAGNOSIS — Z951 Presence of aortocoronary bypass graft: Secondary | ICD-10-CM | POA: Diagnosis not present

## 2015-10-01 DIAGNOSIS — R531 Weakness: Secondary | ICD-10-CM | POA: Diagnosis not present

## 2015-10-01 DIAGNOSIS — I2510 Atherosclerotic heart disease of native coronary artery without angina pectoris: Secondary | ICD-10-CM | POA: Diagnosis not present

## 2015-10-01 DIAGNOSIS — Z950 Presence of cardiac pacemaker: Secondary | ICD-10-CM | POA: Diagnosis not present

## 2015-10-01 DIAGNOSIS — C61 Malignant neoplasm of prostate: Secondary | ICD-10-CM | POA: Diagnosis not present

## 2015-10-01 DIAGNOSIS — I10 Essential (primary) hypertension: Secondary | ICD-10-CM | POA: Diagnosis not present

## 2015-10-01 DIAGNOSIS — D649 Anemia, unspecified: Secondary | ICD-10-CM | POA: Diagnosis not present

## 2015-10-01 DIAGNOSIS — R2689 Other abnormalities of gait and mobility: Secondary | ICD-10-CM | POA: Diagnosis not present

## 2015-10-01 DIAGNOSIS — Z89412 Acquired absence of left great toe: Secondary | ICD-10-CM | POA: Diagnosis not present

## 2015-10-01 DIAGNOSIS — E1122 Type 2 diabetes mellitus with diabetic chronic kidney disease: Secondary | ICD-10-CM | POA: Diagnosis not present

## 2015-10-01 DIAGNOSIS — E1151 Type 2 diabetes mellitus with diabetic peripheral angiopathy without gangrene: Secondary | ICD-10-CM | POA: Diagnosis not present

## 2015-10-04 DIAGNOSIS — D649 Anemia, unspecified: Secondary | ICD-10-CM | POA: Diagnosis not present

## 2015-10-04 DIAGNOSIS — Z951 Presence of aortocoronary bypass graft: Secondary | ICD-10-CM | POA: Diagnosis not present

## 2015-10-04 DIAGNOSIS — E1122 Type 2 diabetes mellitus with diabetic chronic kidney disease: Secondary | ICD-10-CM | POA: Diagnosis not present

## 2015-10-04 DIAGNOSIS — R2689 Other abnormalities of gait and mobility: Secondary | ICD-10-CM | POA: Diagnosis not present

## 2015-10-04 DIAGNOSIS — I10 Essential (primary) hypertension: Secondary | ICD-10-CM | POA: Diagnosis not present

## 2015-10-04 DIAGNOSIS — E1151 Type 2 diabetes mellitus with diabetic peripheral angiopathy without gangrene: Secondary | ICD-10-CM | POA: Diagnosis not present

## 2015-10-04 DIAGNOSIS — C61 Malignant neoplasm of prostate: Secondary | ICD-10-CM | POA: Diagnosis not present

## 2015-10-04 DIAGNOSIS — Z89412 Acquired absence of left great toe: Secondary | ICD-10-CM | POA: Diagnosis not present

## 2015-10-04 DIAGNOSIS — N184 Chronic kidney disease, stage 4 (severe): Secondary | ICD-10-CM | POA: Diagnosis not present

## 2015-10-04 DIAGNOSIS — Z950 Presence of cardiac pacemaker: Secondary | ICD-10-CM | POA: Diagnosis not present

## 2015-10-04 DIAGNOSIS — J449 Chronic obstructive pulmonary disease, unspecified: Secondary | ICD-10-CM | POA: Diagnosis not present

## 2015-10-04 DIAGNOSIS — R531 Weakness: Secondary | ICD-10-CM | POA: Diagnosis not present

## 2015-10-04 DIAGNOSIS — I2510 Atherosclerotic heart disease of native coronary artery without angina pectoris: Secondary | ICD-10-CM | POA: Diagnosis not present

## 2015-10-05 DIAGNOSIS — Z951 Presence of aortocoronary bypass graft: Secondary | ICD-10-CM | POA: Diagnosis not present

## 2015-10-05 DIAGNOSIS — R2689 Other abnormalities of gait and mobility: Secondary | ICD-10-CM | POA: Diagnosis not present

## 2015-10-05 DIAGNOSIS — E1151 Type 2 diabetes mellitus with diabetic peripheral angiopathy without gangrene: Secondary | ICD-10-CM | POA: Diagnosis not present

## 2015-10-05 DIAGNOSIS — E1122 Type 2 diabetes mellitus with diabetic chronic kidney disease: Secondary | ICD-10-CM | POA: Diagnosis not present

## 2015-10-05 DIAGNOSIS — N184 Chronic kidney disease, stage 4 (severe): Secondary | ICD-10-CM | POA: Diagnosis not present

## 2015-10-05 DIAGNOSIS — I2510 Atherosclerotic heart disease of native coronary artery without angina pectoris: Secondary | ICD-10-CM | POA: Diagnosis not present

## 2015-10-05 DIAGNOSIS — Z950 Presence of cardiac pacemaker: Secondary | ICD-10-CM | POA: Diagnosis not present

## 2015-10-05 DIAGNOSIS — J449 Chronic obstructive pulmonary disease, unspecified: Secondary | ICD-10-CM | POA: Diagnosis not present

## 2015-10-05 DIAGNOSIS — C61 Malignant neoplasm of prostate: Secondary | ICD-10-CM | POA: Diagnosis not present

## 2015-10-05 DIAGNOSIS — Z89412 Acquired absence of left great toe: Secondary | ICD-10-CM | POA: Diagnosis not present

## 2015-10-05 DIAGNOSIS — D649 Anemia, unspecified: Secondary | ICD-10-CM | POA: Diagnosis not present

## 2015-10-05 DIAGNOSIS — I10 Essential (primary) hypertension: Secondary | ICD-10-CM | POA: Diagnosis not present

## 2015-10-05 DIAGNOSIS — R531 Weakness: Secondary | ICD-10-CM | POA: Diagnosis not present

## 2015-10-08 DIAGNOSIS — R2689 Other abnormalities of gait and mobility: Secondary | ICD-10-CM | POA: Diagnosis not present

## 2015-10-08 DIAGNOSIS — E1122 Type 2 diabetes mellitus with diabetic chronic kidney disease: Secondary | ICD-10-CM | POA: Diagnosis not present

## 2015-10-08 DIAGNOSIS — I10 Essential (primary) hypertension: Secondary | ICD-10-CM | POA: Diagnosis not present

## 2015-10-08 DIAGNOSIS — I2510 Atherosclerotic heart disease of native coronary artery without angina pectoris: Secondary | ICD-10-CM | POA: Diagnosis not present

## 2015-10-08 DIAGNOSIS — N184 Chronic kidney disease, stage 4 (severe): Secondary | ICD-10-CM | POA: Diagnosis not present

## 2015-10-08 DIAGNOSIS — Z951 Presence of aortocoronary bypass graft: Secondary | ICD-10-CM | POA: Diagnosis not present

## 2015-10-08 DIAGNOSIS — D649 Anemia, unspecified: Secondary | ICD-10-CM | POA: Diagnosis not present

## 2015-10-08 DIAGNOSIS — C61 Malignant neoplasm of prostate: Secondary | ICD-10-CM | POA: Diagnosis not present

## 2015-10-08 DIAGNOSIS — J449 Chronic obstructive pulmonary disease, unspecified: Secondary | ICD-10-CM | POA: Diagnosis not present

## 2015-10-08 DIAGNOSIS — Z89412 Acquired absence of left great toe: Secondary | ICD-10-CM | POA: Diagnosis not present

## 2015-10-08 DIAGNOSIS — R531 Weakness: Secondary | ICD-10-CM | POA: Diagnosis not present

## 2015-10-08 DIAGNOSIS — E1151 Type 2 diabetes mellitus with diabetic peripheral angiopathy without gangrene: Secondary | ICD-10-CM | POA: Diagnosis not present

## 2015-10-08 DIAGNOSIS — Z950 Presence of cardiac pacemaker: Secondary | ICD-10-CM | POA: Diagnosis not present

## 2015-10-11 DIAGNOSIS — Z89412 Acquired absence of left great toe: Secondary | ICD-10-CM | POA: Diagnosis not present

## 2015-10-11 DIAGNOSIS — Z950 Presence of cardiac pacemaker: Secondary | ICD-10-CM | POA: Diagnosis not present

## 2015-10-11 DIAGNOSIS — R2689 Other abnormalities of gait and mobility: Secondary | ICD-10-CM | POA: Diagnosis not present

## 2015-10-11 DIAGNOSIS — J449 Chronic obstructive pulmonary disease, unspecified: Secondary | ICD-10-CM | POA: Diagnosis not present

## 2015-10-11 DIAGNOSIS — E1122 Type 2 diabetes mellitus with diabetic chronic kidney disease: Secondary | ICD-10-CM | POA: Diagnosis not present

## 2015-10-11 DIAGNOSIS — I2510 Atherosclerotic heart disease of native coronary artery without angina pectoris: Secondary | ICD-10-CM | POA: Diagnosis not present

## 2015-10-11 DIAGNOSIS — E1151 Type 2 diabetes mellitus with diabetic peripheral angiopathy without gangrene: Secondary | ICD-10-CM | POA: Diagnosis not present

## 2015-10-11 DIAGNOSIS — Z951 Presence of aortocoronary bypass graft: Secondary | ICD-10-CM | POA: Diagnosis not present

## 2015-10-11 DIAGNOSIS — R531 Weakness: Secondary | ICD-10-CM | POA: Diagnosis not present

## 2015-10-11 DIAGNOSIS — I10 Essential (primary) hypertension: Secondary | ICD-10-CM | POA: Diagnosis not present

## 2015-10-11 DIAGNOSIS — C61 Malignant neoplasm of prostate: Secondary | ICD-10-CM | POA: Diagnosis not present

## 2015-10-11 DIAGNOSIS — N184 Chronic kidney disease, stage 4 (severe): Secondary | ICD-10-CM | POA: Diagnosis not present

## 2015-10-11 DIAGNOSIS — D649 Anemia, unspecified: Secondary | ICD-10-CM | POA: Diagnosis not present

## 2015-10-12 DIAGNOSIS — C61 Malignant neoplasm of prostate: Secondary | ICD-10-CM | POA: Diagnosis not present

## 2015-10-12 DIAGNOSIS — R2689 Other abnormalities of gait and mobility: Secondary | ICD-10-CM | POA: Diagnosis not present

## 2015-10-12 DIAGNOSIS — I10 Essential (primary) hypertension: Secondary | ICD-10-CM | POA: Diagnosis not present

## 2015-10-12 DIAGNOSIS — E1122 Type 2 diabetes mellitus with diabetic chronic kidney disease: Secondary | ICD-10-CM | POA: Diagnosis not present

## 2015-10-12 DIAGNOSIS — Z951 Presence of aortocoronary bypass graft: Secondary | ICD-10-CM | POA: Diagnosis not present

## 2015-10-12 DIAGNOSIS — Z89412 Acquired absence of left great toe: Secondary | ICD-10-CM | POA: Diagnosis not present

## 2015-10-12 DIAGNOSIS — R531 Weakness: Secondary | ICD-10-CM | POA: Diagnosis not present

## 2015-10-12 DIAGNOSIS — Z950 Presence of cardiac pacemaker: Secondary | ICD-10-CM | POA: Diagnosis not present

## 2015-10-12 DIAGNOSIS — J449 Chronic obstructive pulmonary disease, unspecified: Secondary | ICD-10-CM | POA: Diagnosis not present

## 2015-10-12 DIAGNOSIS — D649 Anemia, unspecified: Secondary | ICD-10-CM | POA: Diagnosis not present

## 2015-10-12 DIAGNOSIS — N184 Chronic kidney disease, stage 4 (severe): Secondary | ICD-10-CM | POA: Diagnosis not present

## 2015-10-12 DIAGNOSIS — E1151 Type 2 diabetes mellitus with diabetic peripheral angiopathy without gangrene: Secondary | ICD-10-CM | POA: Diagnosis not present

## 2015-10-12 DIAGNOSIS — I2510 Atherosclerotic heart disease of native coronary artery without angina pectoris: Secondary | ICD-10-CM | POA: Diagnosis not present

## 2015-10-13 ENCOUNTER — Other Ambulatory Visit: Payer: Self-pay | Admitting: Cardiology

## 2015-10-13 DIAGNOSIS — E1151 Type 2 diabetes mellitus with diabetic peripheral angiopathy without gangrene: Secondary | ICD-10-CM | POA: Diagnosis not present

## 2015-10-13 DIAGNOSIS — Z950 Presence of cardiac pacemaker: Secondary | ICD-10-CM | POA: Diagnosis not present

## 2015-10-13 DIAGNOSIS — R531 Weakness: Secondary | ICD-10-CM | POA: Diagnosis not present

## 2015-10-13 DIAGNOSIS — C61 Malignant neoplasm of prostate: Secondary | ICD-10-CM | POA: Diagnosis not present

## 2015-10-13 DIAGNOSIS — I2510 Atherosclerotic heart disease of native coronary artery without angina pectoris: Secondary | ICD-10-CM | POA: Diagnosis not present

## 2015-10-13 DIAGNOSIS — I10 Essential (primary) hypertension: Secondary | ICD-10-CM | POA: Diagnosis not present

## 2015-10-13 DIAGNOSIS — E1122 Type 2 diabetes mellitus with diabetic chronic kidney disease: Secondary | ICD-10-CM | POA: Diagnosis not present

## 2015-10-13 DIAGNOSIS — N184 Chronic kidney disease, stage 4 (severe): Secondary | ICD-10-CM | POA: Diagnosis not present

## 2015-10-13 DIAGNOSIS — Z951 Presence of aortocoronary bypass graft: Secondary | ICD-10-CM | POA: Diagnosis not present

## 2015-10-13 DIAGNOSIS — Z89412 Acquired absence of left great toe: Secondary | ICD-10-CM | POA: Diagnosis not present

## 2015-10-13 DIAGNOSIS — R2689 Other abnormalities of gait and mobility: Secondary | ICD-10-CM | POA: Diagnosis not present

## 2015-10-13 DIAGNOSIS — D649 Anemia, unspecified: Secondary | ICD-10-CM | POA: Diagnosis not present

## 2015-10-13 DIAGNOSIS — J449 Chronic obstructive pulmonary disease, unspecified: Secondary | ICD-10-CM | POA: Diagnosis not present

## 2015-10-14 DIAGNOSIS — R2689 Other abnormalities of gait and mobility: Secondary | ICD-10-CM | POA: Diagnosis not present

## 2015-10-14 DIAGNOSIS — E1151 Type 2 diabetes mellitus with diabetic peripheral angiopathy without gangrene: Secondary | ICD-10-CM | POA: Diagnosis not present

## 2015-10-14 DIAGNOSIS — Z950 Presence of cardiac pacemaker: Secondary | ICD-10-CM | POA: Diagnosis not present

## 2015-10-14 DIAGNOSIS — D649 Anemia, unspecified: Secondary | ICD-10-CM | POA: Diagnosis not present

## 2015-10-14 DIAGNOSIS — I2510 Atherosclerotic heart disease of native coronary artery without angina pectoris: Secondary | ICD-10-CM | POA: Diagnosis not present

## 2015-10-14 DIAGNOSIS — N184 Chronic kidney disease, stage 4 (severe): Secondary | ICD-10-CM | POA: Diagnosis not present

## 2015-10-14 DIAGNOSIS — Z951 Presence of aortocoronary bypass graft: Secondary | ICD-10-CM | POA: Diagnosis not present

## 2015-10-14 DIAGNOSIS — I10 Essential (primary) hypertension: Secondary | ICD-10-CM | POA: Diagnosis not present

## 2015-10-14 DIAGNOSIS — R531 Weakness: Secondary | ICD-10-CM | POA: Diagnosis not present

## 2015-10-14 DIAGNOSIS — Z89412 Acquired absence of left great toe: Secondary | ICD-10-CM | POA: Diagnosis not present

## 2015-10-14 DIAGNOSIS — J449 Chronic obstructive pulmonary disease, unspecified: Secondary | ICD-10-CM | POA: Diagnosis not present

## 2015-10-14 DIAGNOSIS — C61 Malignant neoplasm of prostate: Secondary | ICD-10-CM | POA: Diagnosis not present

## 2015-10-14 DIAGNOSIS — E1122 Type 2 diabetes mellitus with diabetic chronic kidney disease: Secondary | ICD-10-CM | POA: Diagnosis not present

## 2015-10-18 DIAGNOSIS — J449 Chronic obstructive pulmonary disease, unspecified: Secondary | ICD-10-CM | POA: Diagnosis not present

## 2015-10-18 DIAGNOSIS — E1151 Type 2 diabetes mellitus with diabetic peripheral angiopathy without gangrene: Secondary | ICD-10-CM | POA: Diagnosis not present

## 2015-10-18 DIAGNOSIS — R531 Weakness: Secondary | ICD-10-CM | POA: Diagnosis not present

## 2015-10-18 DIAGNOSIS — Z89412 Acquired absence of left great toe: Secondary | ICD-10-CM | POA: Diagnosis not present

## 2015-10-18 DIAGNOSIS — E1122 Type 2 diabetes mellitus with diabetic chronic kidney disease: Secondary | ICD-10-CM | POA: Diagnosis not present

## 2015-10-18 DIAGNOSIS — Z951 Presence of aortocoronary bypass graft: Secondary | ICD-10-CM | POA: Diagnosis not present

## 2015-10-18 DIAGNOSIS — I10 Essential (primary) hypertension: Secondary | ICD-10-CM | POA: Diagnosis not present

## 2015-10-18 DIAGNOSIS — R2689 Other abnormalities of gait and mobility: Secondary | ICD-10-CM | POA: Diagnosis not present

## 2015-10-18 DIAGNOSIS — C61 Malignant neoplasm of prostate: Secondary | ICD-10-CM | POA: Diagnosis not present

## 2015-10-18 DIAGNOSIS — D649 Anemia, unspecified: Secondary | ICD-10-CM | POA: Diagnosis not present

## 2015-10-18 DIAGNOSIS — I2510 Atherosclerotic heart disease of native coronary artery without angina pectoris: Secondary | ICD-10-CM | POA: Diagnosis not present

## 2015-10-18 DIAGNOSIS — N184 Chronic kidney disease, stage 4 (severe): Secondary | ICD-10-CM | POA: Diagnosis not present

## 2015-10-18 DIAGNOSIS — Z950 Presence of cardiac pacemaker: Secondary | ICD-10-CM | POA: Diagnosis not present

## 2015-10-20 DIAGNOSIS — I10 Essential (primary) hypertension: Secondary | ICD-10-CM | POA: Diagnosis not present

## 2015-10-20 DIAGNOSIS — N184 Chronic kidney disease, stage 4 (severe): Secondary | ICD-10-CM | POA: Diagnosis not present

## 2015-10-20 DIAGNOSIS — Z951 Presence of aortocoronary bypass graft: Secondary | ICD-10-CM | POA: Diagnosis not present

## 2015-10-20 DIAGNOSIS — R2689 Other abnormalities of gait and mobility: Secondary | ICD-10-CM | POA: Diagnosis not present

## 2015-10-20 DIAGNOSIS — I2510 Atherosclerotic heart disease of native coronary artery without angina pectoris: Secondary | ICD-10-CM | POA: Diagnosis not present

## 2015-10-20 DIAGNOSIS — E1122 Type 2 diabetes mellitus with diabetic chronic kidney disease: Secondary | ICD-10-CM | POA: Diagnosis not present

## 2015-10-20 DIAGNOSIS — Z89412 Acquired absence of left great toe: Secondary | ICD-10-CM | POA: Diagnosis not present

## 2015-10-20 DIAGNOSIS — D649 Anemia, unspecified: Secondary | ICD-10-CM | POA: Diagnosis not present

## 2015-10-20 DIAGNOSIS — Z950 Presence of cardiac pacemaker: Secondary | ICD-10-CM | POA: Diagnosis not present

## 2015-10-20 DIAGNOSIS — C61 Malignant neoplasm of prostate: Secondary | ICD-10-CM | POA: Diagnosis not present

## 2015-10-20 DIAGNOSIS — E1151 Type 2 diabetes mellitus with diabetic peripheral angiopathy without gangrene: Secondary | ICD-10-CM | POA: Diagnosis not present

## 2015-10-20 DIAGNOSIS — R531 Weakness: Secondary | ICD-10-CM | POA: Diagnosis not present

## 2015-10-20 DIAGNOSIS — J449 Chronic obstructive pulmonary disease, unspecified: Secondary | ICD-10-CM | POA: Diagnosis not present

## 2015-10-22 DIAGNOSIS — D649 Anemia, unspecified: Secondary | ICD-10-CM | POA: Diagnosis not present

## 2015-10-22 DIAGNOSIS — R531 Weakness: Secondary | ICD-10-CM | POA: Diagnosis not present

## 2015-10-22 DIAGNOSIS — I2510 Atherosclerotic heart disease of native coronary artery without angina pectoris: Secondary | ICD-10-CM | POA: Diagnosis not present

## 2015-10-22 DIAGNOSIS — Z951 Presence of aortocoronary bypass graft: Secondary | ICD-10-CM | POA: Diagnosis not present

## 2015-10-22 DIAGNOSIS — E1151 Type 2 diabetes mellitus with diabetic peripheral angiopathy without gangrene: Secondary | ICD-10-CM | POA: Diagnosis not present

## 2015-10-22 DIAGNOSIS — Z89412 Acquired absence of left great toe: Secondary | ICD-10-CM | POA: Diagnosis not present

## 2015-10-22 DIAGNOSIS — C61 Malignant neoplasm of prostate: Secondary | ICD-10-CM | POA: Diagnosis not present

## 2015-10-22 DIAGNOSIS — N184 Chronic kidney disease, stage 4 (severe): Secondary | ICD-10-CM | POA: Diagnosis not present

## 2015-10-22 DIAGNOSIS — E1122 Type 2 diabetes mellitus with diabetic chronic kidney disease: Secondary | ICD-10-CM | POA: Diagnosis not present

## 2015-10-22 DIAGNOSIS — I10 Essential (primary) hypertension: Secondary | ICD-10-CM | POA: Diagnosis not present

## 2015-10-22 DIAGNOSIS — R2689 Other abnormalities of gait and mobility: Secondary | ICD-10-CM | POA: Diagnosis not present

## 2015-10-22 DIAGNOSIS — J449 Chronic obstructive pulmonary disease, unspecified: Secondary | ICD-10-CM | POA: Diagnosis not present

## 2015-10-22 DIAGNOSIS — Z950 Presence of cardiac pacemaker: Secondary | ICD-10-CM | POA: Diagnosis not present

## 2015-10-26 DIAGNOSIS — Z951 Presence of aortocoronary bypass graft: Secondary | ICD-10-CM | POA: Diagnosis not present

## 2015-10-26 DIAGNOSIS — I10 Essential (primary) hypertension: Secondary | ICD-10-CM | POA: Diagnosis not present

## 2015-10-26 DIAGNOSIS — E1151 Type 2 diabetes mellitus with diabetic peripheral angiopathy without gangrene: Secondary | ICD-10-CM | POA: Diagnosis not present

## 2015-10-26 DIAGNOSIS — R531 Weakness: Secondary | ICD-10-CM | POA: Diagnosis not present

## 2015-10-26 DIAGNOSIS — D649 Anemia, unspecified: Secondary | ICD-10-CM | POA: Diagnosis not present

## 2015-10-26 DIAGNOSIS — R2689 Other abnormalities of gait and mobility: Secondary | ICD-10-CM | POA: Diagnosis not present

## 2015-10-26 DIAGNOSIS — Z950 Presence of cardiac pacemaker: Secondary | ICD-10-CM | POA: Diagnosis not present

## 2015-10-26 DIAGNOSIS — N184 Chronic kidney disease, stage 4 (severe): Secondary | ICD-10-CM | POA: Diagnosis not present

## 2015-10-26 DIAGNOSIS — Z89412 Acquired absence of left great toe: Secondary | ICD-10-CM | POA: Diagnosis not present

## 2015-10-26 DIAGNOSIS — E1122 Type 2 diabetes mellitus with diabetic chronic kidney disease: Secondary | ICD-10-CM | POA: Diagnosis not present

## 2015-10-26 DIAGNOSIS — J449 Chronic obstructive pulmonary disease, unspecified: Secondary | ICD-10-CM | POA: Diagnosis not present

## 2015-10-26 DIAGNOSIS — I2510 Atherosclerotic heart disease of native coronary artery without angina pectoris: Secondary | ICD-10-CM | POA: Diagnosis not present

## 2015-10-26 DIAGNOSIS — C61 Malignant neoplasm of prostate: Secondary | ICD-10-CM | POA: Diagnosis not present

## 2015-10-27 DIAGNOSIS — Z950 Presence of cardiac pacemaker: Secondary | ICD-10-CM | POA: Diagnosis not present

## 2015-10-27 DIAGNOSIS — D649 Anemia, unspecified: Secondary | ICD-10-CM | POA: Diagnosis not present

## 2015-10-27 DIAGNOSIS — N184 Chronic kidney disease, stage 4 (severe): Secondary | ICD-10-CM | POA: Diagnosis not present

## 2015-10-27 DIAGNOSIS — C61 Malignant neoplasm of prostate: Secondary | ICD-10-CM | POA: Diagnosis not present

## 2015-10-27 DIAGNOSIS — E1151 Type 2 diabetes mellitus with diabetic peripheral angiopathy without gangrene: Secondary | ICD-10-CM | POA: Diagnosis not present

## 2015-10-27 DIAGNOSIS — I10 Essential (primary) hypertension: Secondary | ICD-10-CM | POA: Diagnosis not present

## 2015-10-27 DIAGNOSIS — Z89412 Acquired absence of left great toe: Secondary | ICD-10-CM | POA: Diagnosis not present

## 2015-10-27 DIAGNOSIS — E1122 Type 2 diabetes mellitus with diabetic chronic kidney disease: Secondary | ICD-10-CM | POA: Diagnosis not present

## 2015-10-27 DIAGNOSIS — J449 Chronic obstructive pulmonary disease, unspecified: Secondary | ICD-10-CM | POA: Diagnosis not present

## 2015-10-27 DIAGNOSIS — I2510 Atherosclerotic heart disease of native coronary artery without angina pectoris: Secondary | ICD-10-CM | POA: Diagnosis not present

## 2015-10-27 DIAGNOSIS — R531 Weakness: Secondary | ICD-10-CM | POA: Diagnosis not present

## 2015-10-27 DIAGNOSIS — Z951 Presence of aortocoronary bypass graft: Secondary | ICD-10-CM | POA: Diagnosis not present

## 2015-10-27 DIAGNOSIS — R2689 Other abnormalities of gait and mobility: Secondary | ICD-10-CM | POA: Diagnosis not present

## 2015-10-29 DIAGNOSIS — Z950 Presence of cardiac pacemaker: Secondary | ICD-10-CM | POA: Diagnosis not present

## 2015-10-29 DIAGNOSIS — Z951 Presence of aortocoronary bypass graft: Secondary | ICD-10-CM | POA: Diagnosis not present

## 2015-10-29 DIAGNOSIS — C61 Malignant neoplasm of prostate: Secondary | ICD-10-CM | POA: Diagnosis not present

## 2015-10-29 DIAGNOSIS — R531 Weakness: Secondary | ICD-10-CM | POA: Diagnosis not present

## 2015-10-29 DIAGNOSIS — I10 Essential (primary) hypertension: Secondary | ICD-10-CM | POA: Diagnosis not present

## 2015-10-29 DIAGNOSIS — E1151 Type 2 diabetes mellitus with diabetic peripheral angiopathy without gangrene: Secondary | ICD-10-CM | POA: Diagnosis not present

## 2015-10-29 DIAGNOSIS — J449 Chronic obstructive pulmonary disease, unspecified: Secondary | ICD-10-CM | POA: Diagnosis not present

## 2015-10-29 DIAGNOSIS — R2689 Other abnormalities of gait and mobility: Secondary | ICD-10-CM | POA: Diagnosis not present

## 2015-10-29 DIAGNOSIS — N184 Chronic kidney disease, stage 4 (severe): Secondary | ICD-10-CM | POA: Diagnosis not present

## 2015-10-29 DIAGNOSIS — Z89412 Acquired absence of left great toe: Secondary | ICD-10-CM | POA: Diagnosis not present

## 2015-10-29 DIAGNOSIS — E1122 Type 2 diabetes mellitus with diabetic chronic kidney disease: Secondary | ICD-10-CM | POA: Diagnosis not present

## 2015-10-29 DIAGNOSIS — D649 Anemia, unspecified: Secondary | ICD-10-CM | POA: Diagnosis not present

## 2015-10-29 DIAGNOSIS — I2510 Atherosclerotic heart disease of native coronary artery without angina pectoris: Secondary | ICD-10-CM | POA: Diagnosis not present

## 2015-11-02 DIAGNOSIS — E7800 Pure hypercholesterolemia, unspecified: Secondary | ICD-10-CM | POA: Diagnosis not present

## 2015-11-02 DIAGNOSIS — Z951 Presence of aortocoronary bypass graft: Secondary | ICD-10-CM | POA: Diagnosis not present

## 2015-11-02 DIAGNOSIS — R531 Weakness: Secondary | ICD-10-CM | POA: Diagnosis not present

## 2015-11-02 DIAGNOSIS — K219 Gastro-esophageal reflux disease without esophagitis: Secondary | ICD-10-CM | POA: Diagnosis not present

## 2015-11-02 DIAGNOSIS — R5381 Other malaise: Secondary | ICD-10-CM | POA: Diagnosis not present

## 2015-11-02 DIAGNOSIS — D649 Anemia, unspecified: Secondary | ICD-10-CM | POA: Diagnosis not present

## 2015-11-02 DIAGNOSIS — E1159 Type 2 diabetes mellitus with other circulatory complications: Secondary | ICD-10-CM | POA: Diagnosis not present

## 2015-11-02 DIAGNOSIS — N184 Chronic kidney disease, stage 4 (severe): Secondary | ICD-10-CM | POA: Diagnosis not present

## 2015-11-02 DIAGNOSIS — I2510 Atherosclerotic heart disease of native coronary artery without angina pectoris: Secondary | ICD-10-CM | POA: Diagnosis not present

## 2015-11-02 DIAGNOSIS — J449 Chronic obstructive pulmonary disease, unspecified: Secondary | ICD-10-CM | POA: Diagnosis not present

## 2015-11-02 DIAGNOSIS — Z89412 Acquired absence of left great toe: Secondary | ICD-10-CM | POA: Diagnosis not present

## 2015-11-02 DIAGNOSIS — I10 Essential (primary) hypertension: Secondary | ICD-10-CM | POA: Diagnosis not present

## 2015-11-02 DIAGNOSIS — E538 Deficiency of other specified B group vitamins: Secondary | ICD-10-CM | POA: Diagnosis not present

## 2015-11-02 DIAGNOSIS — C61 Malignant neoplasm of prostate: Secondary | ICD-10-CM | POA: Diagnosis not present

## 2015-11-02 DIAGNOSIS — Z950 Presence of cardiac pacemaker: Secondary | ICD-10-CM | POA: Diagnosis not present

## 2015-11-02 DIAGNOSIS — E1122 Type 2 diabetes mellitus with diabetic chronic kidney disease: Secondary | ICD-10-CM | POA: Diagnosis not present

## 2015-11-02 DIAGNOSIS — E1151 Type 2 diabetes mellitus with diabetic peripheral angiopathy without gangrene: Secondary | ICD-10-CM | POA: Diagnosis not present

## 2015-11-02 DIAGNOSIS — R2689 Other abnormalities of gait and mobility: Secondary | ICD-10-CM | POA: Diagnosis not present

## 2015-11-03 DIAGNOSIS — D649 Anemia, unspecified: Secondary | ICD-10-CM | POA: Diagnosis not present

## 2015-11-03 DIAGNOSIS — J449 Chronic obstructive pulmonary disease, unspecified: Secondary | ICD-10-CM | POA: Diagnosis not present

## 2015-11-03 DIAGNOSIS — I10 Essential (primary) hypertension: Secondary | ICD-10-CM | POA: Diagnosis not present

## 2015-11-03 DIAGNOSIS — Z950 Presence of cardiac pacemaker: Secondary | ICD-10-CM | POA: Diagnosis not present

## 2015-11-03 DIAGNOSIS — N184 Chronic kidney disease, stage 4 (severe): Secondary | ICD-10-CM | POA: Diagnosis not present

## 2015-11-03 DIAGNOSIS — E1122 Type 2 diabetes mellitus with diabetic chronic kidney disease: Secondary | ICD-10-CM | POA: Diagnosis not present

## 2015-11-03 DIAGNOSIS — Z89412 Acquired absence of left great toe: Secondary | ICD-10-CM | POA: Diagnosis not present

## 2015-11-03 DIAGNOSIS — I2510 Atherosclerotic heart disease of native coronary artery without angina pectoris: Secondary | ICD-10-CM | POA: Diagnosis not present

## 2015-11-03 DIAGNOSIS — C61 Malignant neoplasm of prostate: Secondary | ICD-10-CM | POA: Diagnosis not present

## 2015-11-03 DIAGNOSIS — Z951 Presence of aortocoronary bypass graft: Secondary | ICD-10-CM | POA: Diagnosis not present

## 2015-11-03 DIAGNOSIS — R531 Weakness: Secondary | ICD-10-CM | POA: Diagnosis not present

## 2015-11-03 DIAGNOSIS — R2689 Other abnormalities of gait and mobility: Secondary | ICD-10-CM | POA: Diagnosis not present

## 2015-11-03 DIAGNOSIS — E1151 Type 2 diabetes mellitus with diabetic peripheral angiopathy without gangrene: Secondary | ICD-10-CM | POA: Diagnosis not present

## 2015-11-04 DIAGNOSIS — N184 Chronic kidney disease, stage 4 (severe): Secondary | ICD-10-CM | POA: Diagnosis not present

## 2015-11-04 DIAGNOSIS — R2689 Other abnormalities of gait and mobility: Secondary | ICD-10-CM | POA: Diagnosis not present

## 2015-11-04 DIAGNOSIS — E1151 Type 2 diabetes mellitus with diabetic peripheral angiopathy without gangrene: Secondary | ICD-10-CM | POA: Diagnosis not present

## 2015-11-04 DIAGNOSIS — C61 Malignant neoplasm of prostate: Secondary | ICD-10-CM | POA: Diagnosis not present

## 2015-11-04 DIAGNOSIS — R531 Weakness: Secondary | ICD-10-CM | POA: Diagnosis not present

## 2015-11-04 DIAGNOSIS — Z951 Presence of aortocoronary bypass graft: Secondary | ICD-10-CM | POA: Diagnosis not present

## 2015-11-04 DIAGNOSIS — I10 Essential (primary) hypertension: Secondary | ICD-10-CM | POA: Diagnosis not present

## 2015-11-04 DIAGNOSIS — D649 Anemia, unspecified: Secondary | ICD-10-CM | POA: Diagnosis not present

## 2015-11-04 DIAGNOSIS — E1122 Type 2 diabetes mellitus with diabetic chronic kidney disease: Secondary | ICD-10-CM | POA: Diagnosis not present

## 2015-11-04 DIAGNOSIS — Z950 Presence of cardiac pacemaker: Secondary | ICD-10-CM | POA: Diagnosis not present

## 2015-11-04 DIAGNOSIS — I2510 Atherosclerotic heart disease of native coronary artery without angina pectoris: Secondary | ICD-10-CM | POA: Diagnosis not present

## 2015-11-04 DIAGNOSIS — Z89412 Acquired absence of left great toe: Secondary | ICD-10-CM | POA: Diagnosis not present

## 2015-11-04 DIAGNOSIS — J449 Chronic obstructive pulmonary disease, unspecified: Secondary | ICD-10-CM | POA: Diagnosis not present

## 2015-11-05 ENCOUNTER — Encounter: Payer: Self-pay | Admitting: Physician Assistant

## 2015-11-05 DIAGNOSIS — Z951 Presence of aortocoronary bypass graft: Secondary | ICD-10-CM | POA: Diagnosis not present

## 2015-11-05 DIAGNOSIS — Z0001 Encounter for general adult medical examination with abnormal findings: Secondary | ICD-10-CM | POA: Diagnosis not present

## 2015-11-05 DIAGNOSIS — I509 Heart failure, unspecified: Secondary | ICD-10-CM | POA: Diagnosis not present

## 2015-11-05 DIAGNOSIS — E1122 Type 2 diabetes mellitus with diabetic chronic kidney disease: Secondary | ICD-10-CM | POA: Diagnosis not present

## 2015-11-05 DIAGNOSIS — Z23 Encounter for immunization: Secondary | ICD-10-CM | POA: Diagnosis not present

## 2015-11-05 DIAGNOSIS — Z89412 Acquired absence of left great toe: Secondary | ICD-10-CM | POA: Diagnosis not present

## 2015-11-05 DIAGNOSIS — N184 Chronic kidney disease, stage 4 (severe): Secondary | ICD-10-CM | POA: Diagnosis not present

## 2015-11-05 DIAGNOSIS — R2689 Other abnormalities of gait and mobility: Secondary | ICD-10-CM | POA: Diagnosis not present

## 2015-11-05 DIAGNOSIS — I10 Essential (primary) hypertension: Secondary | ICD-10-CM | POA: Diagnosis not present

## 2015-11-05 DIAGNOSIS — D649 Anemia, unspecified: Secondary | ICD-10-CM | POA: Diagnosis not present

## 2015-11-05 DIAGNOSIS — E875 Hyperkalemia: Secondary | ICD-10-CM | POA: Diagnosis not present

## 2015-11-05 DIAGNOSIS — C61 Malignant neoplasm of prostate: Secondary | ICD-10-CM | POA: Diagnosis not present

## 2015-11-05 DIAGNOSIS — E559 Vitamin D deficiency, unspecified: Secondary | ICD-10-CM | POA: Diagnosis not present

## 2015-11-05 DIAGNOSIS — R5383 Other fatigue: Secondary | ICD-10-CM | POA: Diagnosis not present

## 2015-11-05 DIAGNOSIS — E1151 Type 2 diabetes mellitus with diabetic peripheral angiopathy without gangrene: Secondary | ICD-10-CM | POA: Diagnosis not present

## 2015-11-05 DIAGNOSIS — E1159 Type 2 diabetes mellitus with other circulatory complications: Secondary | ICD-10-CM | POA: Diagnosis not present

## 2015-11-05 DIAGNOSIS — E1142 Type 2 diabetes mellitus with diabetic polyneuropathy: Secondary | ICD-10-CM | POA: Diagnosis not present

## 2015-11-05 DIAGNOSIS — J449 Chronic obstructive pulmonary disease, unspecified: Secondary | ICD-10-CM | POA: Diagnosis not present

## 2015-11-05 DIAGNOSIS — Z950 Presence of cardiac pacemaker: Secondary | ICD-10-CM | POA: Diagnosis not present

## 2015-11-05 DIAGNOSIS — R531 Weakness: Secondary | ICD-10-CM | POA: Diagnosis not present

## 2015-11-05 DIAGNOSIS — I2510 Atherosclerotic heart disease of native coronary artery without angina pectoris: Secondary | ICD-10-CM | POA: Diagnosis not present

## 2015-11-08 DIAGNOSIS — Z951 Presence of aortocoronary bypass graft: Secondary | ICD-10-CM | POA: Diagnosis not present

## 2015-11-08 DIAGNOSIS — D649 Anemia, unspecified: Secondary | ICD-10-CM | POA: Diagnosis not present

## 2015-11-08 DIAGNOSIS — E1151 Type 2 diabetes mellitus with diabetic peripheral angiopathy without gangrene: Secondary | ICD-10-CM | POA: Diagnosis not present

## 2015-11-08 DIAGNOSIS — N184 Chronic kidney disease, stage 4 (severe): Secondary | ICD-10-CM | POA: Diagnosis not present

## 2015-11-08 DIAGNOSIS — Z89412 Acquired absence of left great toe: Secondary | ICD-10-CM | POA: Diagnosis not present

## 2015-11-08 DIAGNOSIS — R2689 Other abnormalities of gait and mobility: Secondary | ICD-10-CM | POA: Diagnosis not present

## 2015-11-08 DIAGNOSIS — Z950 Presence of cardiac pacemaker: Secondary | ICD-10-CM | POA: Diagnosis not present

## 2015-11-08 DIAGNOSIS — I2510 Atherosclerotic heart disease of native coronary artery without angina pectoris: Secondary | ICD-10-CM | POA: Diagnosis not present

## 2015-11-08 DIAGNOSIS — R531 Weakness: Secondary | ICD-10-CM | POA: Diagnosis not present

## 2015-11-08 DIAGNOSIS — C61 Malignant neoplasm of prostate: Secondary | ICD-10-CM | POA: Diagnosis not present

## 2015-11-08 DIAGNOSIS — I10 Essential (primary) hypertension: Secondary | ICD-10-CM | POA: Diagnosis not present

## 2015-11-08 DIAGNOSIS — J449 Chronic obstructive pulmonary disease, unspecified: Secondary | ICD-10-CM | POA: Diagnosis not present

## 2015-11-08 DIAGNOSIS — E1122 Type 2 diabetes mellitus with diabetic chronic kidney disease: Secondary | ICD-10-CM | POA: Diagnosis not present

## 2015-11-09 DIAGNOSIS — D649 Anemia, unspecified: Secondary | ICD-10-CM | POA: Diagnosis not present

## 2015-11-09 DIAGNOSIS — I2510 Atherosclerotic heart disease of native coronary artery without angina pectoris: Secondary | ICD-10-CM | POA: Diagnosis not present

## 2015-11-09 DIAGNOSIS — R531 Weakness: Secondary | ICD-10-CM | POA: Diagnosis not present

## 2015-11-09 DIAGNOSIS — N184 Chronic kidney disease, stage 4 (severe): Secondary | ICD-10-CM | POA: Diagnosis not present

## 2015-11-09 DIAGNOSIS — Z951 Presence of aortocoronary bypass graft: Secondary | ICD-10-CM | POA: Diagnosis not present

## 2015-11-09 DIAGNOSIS — R2689 Other abnormalities of gait and mobility: Secondary | ICD-10-CM | POA: Diagnosis not present

## 2015-11-09 DIAGNOSIS — I10 Essential (primary) hypertension: Secondary | ICD-10-CM | POA: Diagnosis not present

## 2015-11-09 DIAGNOSIS — Z89412 Acquired absence of left great toe: Secondary | ICD-10-CM | POA: Diagnosis not present

## 2015-11-09 DIAGNOSIS — C61 Malignant neoplasm of prostate: Secondary | ICD-10-CM | POA: Diagnosis not present

## 2015-11-09 DIAGNOSIS — E1122 Type 2 diabetes mellitus with diabetic chronic kidney disease: Secondary | ICD-10-CM | POA: Diagnosis not present

## 2015-11-09 DIAGNOSIS — Z950 Presence of cardiac pacemaker: Secondary | ICD-10-CM | POA: Diagnosis not present

## 2015-11-09 DIAGNOSIS — J449 Chronic obstructive pulmonary disease, unspecified: Secondary | ICD-10-CM | POA: Diagnosis not present

## 2015-11-09 DIAGNOSIS — E1151 Type 2 diabetes mellitus with diabetic peripheral angiopathy without gangrene: Secondary | ICD-10-CM | POA: Diagnosis not present

## 2015-11-10 DIAGNOSIS — J449 Chronic obstructive pulmonary disease, unspecified: Secondary | ICD-10-CM | POA: Diagnosis not present

## 2015-11-10 DIAGNOSIS — Z950 Presence of cardiac pacemaker: Secondary | ICD-10-CM | POA: Diagnosis not present

## 2015-11-10 DIAGNOSIS — Z951 Presence of aortocoronary bypass graft: Secondary | ICD-10-CM | POA: Diagnosis not present

## 2015-11-10 DIAGNOSIS — C61 Malignant neoplasm of prostate: Secondary | ICD-10-CM | POA: Diagnosis not present

## 2015-11-10 DIAGNOSIS — E1151 Type 2 diabetes mellitus with diabetic peripheral angiopathy without gangrene: Secondary | ICD-10-CM | POA: Diagnosis not present

## 2015-11-10 DIAGNOSIS — R2689 Other abnormalities of gait and mobility: Secondary | ICD-10-CM | POA: Diagnosis not present

## 2015-11-10 DIAGNOSIS — R531 Weakness: Secondary | ICD-10-CM | POA: Diagnosis not present

## 2015-11-10 DIAGNOSIS — Z89412 Acquired absence of left great toe: Secondary | ICD-10-CM | POA: Diagnosis not present

## 2015-11-10 DIAGNOSIS — D649 Anemia, unspecified: Secondary | ICD-10-CM | POA: Diagnosis not present

## 2015-11-10 DIAGNOSIS — I10 Essential (primary) hypertension: Secondary | ICD-10-CM | POA: Diagnosis not present

## 2015-11-10 DIAGNOSIS — I2510 Atherosclerotic heart disease of native coronary artery without angina pectoris: Secondary | ICD-10-CM | POA: Diagnosis not present

## 2015-11-10 DIAGNOSIS — E1122 Type 2 diabetes mellitus with diabetic chronic kidney disease: Secondary | ICD-10-CM | POA: Diagnosis not present

## 2015-11-10 DIAGNOSIS — N184 Chronic kidney disease, stage 4 (severe): Secondary | ICD-10-CM | POA: Diagnosis not present

## 2015-11-11 DIAGNOSIS — E1151 Type 2 diabetes mellitus with diabetic peripheral angiopathy without gangrene: Secondary | ICD-10-CM | POA: Diagnosis not present

## 2015-11-11 DIAGNOSIS — N184 Chronic kidney disease, stage 4 (severe): Secondary | ICD-10-CM | POA: Diagnosis not present

## 2015-11-11 DIAGNOSIS — R2689 Other abnormalities of gait and mobility: Secondary | ICD-10-CM | POA: Diagnosis not present

## 2015-11-11 DIAGNOSIS — D649 Anemia, unspecified: Secondary | ICD-10-CM | POA: Diagnosis not present

## 2015-11-11 DIAGNOSIS — Z89412 Acquired absence of left great toe: Secondary | ICD-10-CM | POA: Diagnosis not present

## 2015-11-11 DIAGNOSIS — Z951 Presence of aortocoronary bypass graft: Secondary | ICD-10-CM | POA: Diagnosis not present

## 2015-11-11 DIAGNOSIS — E1122 Type 2 diabetes mellitus with diabetic chronic kidney disease: Secondary | ICD-10-CM | POA: Diagnosis not present

## 2015-11-11 DIAGNOSIS — R531 Weakness: Secondary | ICD-10-CM | POA: Diagnosis not present

## 2015-11-11 DIAGNOSIS — C61 Malignant neoplasm of prostate: Secondary | ICD-10-CM | POA: Diagnosis not present

## 2015-11-11 DIAGNOSIS — I2510 Atherosclerotic heart disease of native coronary artery without angina pectoris: Secondary | ICD-10-CM | POA: Diagnosis not present

## 2015-11-11 DIAGNOSIS — I10 Essential (primary) hypertension: Secondary | ICD-10-CM | POA: Diagnosis not present

## 2015-11-11 DIAGNOSIS — Z950 Presence of cardiac pacemaker: Secondary | ICD-10-CM | POA: Diagnosis not present

## 2015-11-11 DIAGNOSIS — J449 Chronic obstructive pulmonary disease, unspecified: Secondary | ICD-10-CM | POA: Diagnosis not present

## 2015-11-12 DIAGNOSIS — I10 Essential (primary) hypertension: Secondary | ICD-10-CM | POA: Diagnosis not present

## 2015-11-12 DIAGNOSIS — Z951 Presence of aortocoronary bypass graft: Secondary | ICD-10-CM | POA: Diagnosis not present

## 2015-11-12 DIAGNOSIS — C61 Malignant neoplasm of prostate: Secondary | ICD-10-CM | POA: Diagnosis not present

## 2015-11-12 DIAGNOSIS — J449 Chronic obstructive pulmonary disease, unspecified: Secondary | ICD-10-CM | POA: Diagnosis not present

## 2015-11-12 DIAGNOSIS — I2510 Atherosclerotic heart disease of native coronary artery without angina pectoris: Secondary | ICD-10-CM | POA: Diagnosis not present

## 2015-11-12 DIAGNOSIS — E1122 Type 2 diabetes mellitus with diabetic chronic kidney disease: Secondary | ICD-10-CM | POA: Diagnosis not present

## 2015-11-12 DIAGNOSIS — D649 Anemia, unspecified: Secondary | ICD-10-CM | POA: Diagnosis not present

## 2015-11-12 DIAGNOSIS — E1151 Type 2 diabetes mellitus with diabetic peripheral angiopathy without gangrene: Secondary | ICD-10-CM | POA: Diagnosis not present

## 2015-11-12 DIAGNOSIS — R531 Weakness: Secondary | ICD-10-CM | POA: Diagnosis not present

## 2015-11-12 DIAGNOSIS — Z89412 Acquired absence of left great toe: Secondary | ICD-10-CM | POA: Diagnosis not present

## 2015-11-12 DIAGNOSIS — R2689 Other abnormalities of gait and mobility: Secondary | ICD-10-CM | POA: Diagnosis not present

## 2015-11-12 DIAGNOSIS — N184 Chronic kidney disease, stage 4 (severe): Secondary | ICD-10-CM | POA: Diagnosis not present

## 2015-11-12 DIAGNOSIS — Z950 Presence of cardiac pacemaker: Secondary | ICD-10-CM | POA: Diagnosis not present

## 2015-11-15 DIAGNOSIS — C61 Malignant neoplasm of prostate: Secondary | ICD-10-CM | POA: Diagnosis not present

## 2015-11-15 DIAGNOSIS — Z951 Presence of aortocoronary bypass graft: Secondary | ICD-10-CM | POA: Diagnosis not present

## 2015-11-15 DIAGNOSIS — E1151 Type 2 diabetes mellitus with diabetic peripheral angiopathy without gangrene: Secondary | ICD-10-CM | POA: Diagnosis not present

## 2015-11-15 DIAGNOSIS — I10 Essential (primary) hypertension: Secondary | ICD-10-CM | POA: Diagnosis not present

## 2015-11-15 DIAGNOSIS — Z950 Presence of cardiac pacemaker: Secondary | ICD-10-CM | POA: Diagnosis not present

## 2015-11-15 DIAGNOSIS — Z89412 Acquired absence of left great toe: Secondary | ICD-10-CM | POA: Diagnosis not present

## 2015-11-15 DIAGNOSIS — R2689 Other abnormalities of gait and mobility: Secondary | ICD-10-CM | POA: Diagnosis not present

## 2015-11-15 DIAGNOSIS — N184 Chronic kidney disease, stage 4 (severe): Secondary | ICD-10-CM | POA: Diagnosis not present

## 2015-11-15 DIAGNOSIS — D649 Anemia, unspecified: Secondary | ICD-10-CM | POA: Diagnosis not present

## 2015-11-15 DIAGNOSIS — I2510 Atherosclerotic heart disease of native coronary artery without angina pectoris: Secondary | ICD-10-CM | POA: Diagnosis not present

## 2015-11-15 DIAGNOSIS — J449 Chronic obstructive pulmonary disease, unspecified: Secondary | ICD-10-CM | POA: Diagnosis not present

## 2015-11-15 DIAGNOSIS — E1122 Type 2 diabetes mellitus with diabetic chronic kidney disease: Secondary | ICD-10-CM | POA: Diagnosis not present

## 2015-11-15 DIAGNOSIS — R531 Weakness: Secondary | ICD-10-CM | POA: Diagnosis not present

## 2015-11-16 DIAGNOSIS — J449 Chronic obstructive pulmonary disease, unspecified: Secondary | ICD-10-CM | POA: Diagnosis not present

## 2015-11-16 DIAGNOSIS — Z950 Presence of cardiac pacemaker: Secondary | ICD-10-CM | POA: Diagnosis not present

## 2015-11-16 DIAGNOSIS — E1122 Type 2 diabetes mellitus with diabetic chronic kidney disease: Secondary | ICD-10-CM | POA: Diagnosis not present

## 2015-11-16 DIAGNOSIS — Z951 Presence of aortocoronary bypass graft: Secondary | ICD-10-CM | POA: Diagnosis not present

## 2015-11-16 DIAGNOSIS — R2689 Other abnormalities of gait and mobility: Secondary | ICD-10-CM | POA: Diagnosis not present

## 2015-11-16 DIAGNOSIS — D649 Anemia, unspecified: Secondary | ICD-10-CM | POA: Diagnosis not present

## 2015-11-16 DIAGNOSIS — I10 Essential (primary) hypertension: Secondary | ICD-10-CM | POA: Diagnosis not present

## 2015-11-16 DIAGNOSIS — C61 Malignant neoplasm of prostate: Secondary | ICD-10-CM | POA: Diagnosis not present

## 2015-11-16 DIAGNOSIS — R531 Weakness: Secondary | ICD-10-CM | POA: Diagnosis not present

## 2015-11-16 DIAGNOSIS — N184 Chronic kidney disease, stage 4 (severe): Secondary | ICD-10-CM | POA: Diagnosis not present

## 2015-11-16 DIAGNOSIS — Z89412 Acquired absence of left great toe: Secondary | ICD-10-CM | POA: Diagnosis not present

## 2015-11-16 DIAGNOSIS — E1151 Type 2 diabetes mellitus with diabetic peripheral angiopathy without gangrene: Secondary | ICD-10-CM | POA: Diagnosis not present

## 2015-11-16 DIAGNOSIS — I2510 Atherosclerotic heart disease of native coronary artery without angina pectoris: Secondary | ICD-10-CM | POA: Diagnosis not present

## 2015-11-17 ENCOUNTER — Encounter: Payer: Self-pay | Admitting: Cardiovascular Disease

## 2015-11-17 ENCOUNTER — Ambulatory Visit (INDEPENDENT_AMBULATORY_CARE_PROVIDER_SITE_OTHER): Payer: Medicare Other | Admitting: Cardiovascular Disease

## 2015-11-17 VITALS — BP 152/60 | HR 38 | Ht 70.0 in | Wt 150.4 lb

## 2015-11-17 DIAGNOSIS — R001 Bradycardia, unspecified: Secondary | ICD-10-CM

## 2015-11-17 DIAGNOSIS — R2689 Other abnormalities of gait and mobility: Secondary | ICD-10-CM | POA: Diagnosis not present

## 2015-11-17 DIAGNOSIS — C61 Malignant neoplasm of prostate: Secondary | ICD-10-CM | POA: Diagnosis not present

## 2015-11-17 DIAGNOSIS — E1122 Type 2 diabetes mellitus with diabetic chronic kidney disease: Secondary | ICD-10-CM | POA: Diagnosis not present

## 2015-11-17 DIAGNOSIS — N184 Chronic kidney disease, stage 4 (severe): Secondary | ICD-10-CM | POA: Diagnosis not present

## 2015-11-17 DIAGNOSIS — I5022 Chronic systolic (congestive) heart failure: Secondary | ICD-10-CM | POA: Diagnosis not present

## 2015-11-17 MED ORDER — EZETIMIBE 10 MG PO TABS: 10.0000 mg | ORAL_TABLET | Freq: Every day | ORAL | 3 refills | Status: AC

## 2015-11-17 MED ORDER — ISOSORBIDE MONONITRATE ER 30 MG PO TB24: 30.0000 mg | ORAL_TABLET | Freq: Every day | ORAL | 3 refills | Status: DC

## 2015-11-17 MED ORDER — FUROSEMIDE 40 MG PO TABS: 40.0000 mg | ORAL_TABLET | Freq: Every day | ORAL | 3 refills | Status: DC

## 2015-11-17 MED ORDER — HYDRALAZINE HCL 10 MG PO TABS: 10.0000 mg | ORAL_TABLET | Freq: Three times a day (TID) | ORAL | 3 refills | Status: DC

## 2015-11-17 NOTE — Progress Notes (Signed)
[place] Date of Birth  [birth year]       [facility] [address], [address], [place], [place]  [address]   [place],   [address] [phone]     [phone]   Fax  [phone]    Fax [phone]  Problem List: 1. Coronary artery disease-status post CABG 2004 2. Congestive heart failure-EF of 35% 3. Peripheral vascular disease 4. Dyslipidemia 5. Diabetes mellitus 6. Hypertension 10. Pacemaker 8. Chronic renal insufficiency with baseline creatinine around 2.4 9. Hx of Bacterial endocarditis :        [name] is doing well. He still has some chest pain that is related to positional changes. He does not have any angina pain. He's breathing is overall very good.  He seems to be stable from a cardiac standpoint.  He avoids salt.  Oct. 25, 2017:  Was seen with Colin [name] ( daughter )  [name] has been seen by Dr. Lovena Le for the past several years. 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 He is very bradycardic in the office today.  Has not fallen since January . Energy level has been slow   No CP or dyspnea.   Uses a can or walker at home to walk   Current Outpatient Prescriptions on File Prior to Visit  Medication Sig Dispense Refill  . acetaminophen (TYLENOL) 325 MG tablet Take 2 tablets (650 mg total) by mouth every 6 (six) hours as needed for mild pain or fever.    Marland Kitchen aspirin EC 81 MG tablet Take 81 mg by mouth daily.    . B Complex-C (B-COMPLEX WITH VITAMIN C) tablet Take 1 tablet by mouth daily.    . carvedilol (COREG) 6.25 MG tablet Take 1 tablet (6.25 mg total) by mouth 2 (two) times daily. Please keep 11/17/15 appt for further refills 90 tablet 0  . dextrose 4 G chewable tablet Chew 16 g by mouth daily as needed for low blood sugar.    . docusate sodium 100  MG CAPS Take 100 mg by mouth 2 (two) times daily. 10 capsule 0  . ezetimibe (ZETIA) 10 MG tablet Take 10 mg by mouth daily. Reported on 02/20/2015    . ferrous sulfate 325 (65 FE) MG tablet Take 1 tablet (325 mg total) by mouth daily with breakfast.  3  . furosemide (LASIX) 40 MG tablet TAKE 1 TABLET BY MOUTH EVERY DAY 30 tablet 0  . hydrALAZINE (APRESOLINE) 10 MG tablet Take 1 tablet (10 mg total) by mouth every 8 (eight) hours.    . insulin aspart (NOVOLOG) 100 UNIT/ML injection Inject 3 Units into the skin 3 (three) times daily before meals. Reported on 02/20/2015    . insulin glargine (LANTUS) 100 UNIT/ML injection Inject 0.1 mLs (10 Units total) into the skin at bedtime. 10 mL 11  . isosorbide mononitrate (IMDUR) 30 MG 24 hr tablet TAKE 1 TABLET BY MOUTH EVERY DAY 30 tablet 0  . linagliptin (TRADJENTA) 5 MG TABS tablet Take 5 mg by mouth daily. Reported on 02/20/2015    . Omega-3 Fatty Acids (FISH OIL) 1000 MG CAPS Take 1,000 mg by mouth daily.     . ondansetron (ZOFRAN) 4 MG tablet Take 1 tablet (4 mg total) by mouth every 6 (six) hours as needed  for nausea. 20 tablet 0  . vitamin B-12 (CYANOCOBALAMIN) 1000 MCG tablet Take 1,000 mcg by mouth daily.     No current facility-administered medications on file prior to visit.     Allergies  Allergen Reactions  . Lisinopril Other (See Comments)    REACTION:  Elevated potassium,renal insuf    Past Medical History:  Diagnosis Date  . Anemia 10/29/2013  . Anginal pain ([place])   . Arthritis   . Cardiomyopathy   . CHF (congestive heart failure) ([place]) 2007   ef 38%  . Chronic renal insufficiency    creatinine around 2.4  . COPD (chronic obstructive pulmonary disease) ([place])   . Coronary artery disease    Status post CABG in 2004  . Dementia   . Diabetes mellitus with peripheral artery disease ([place])   . Dyslipidemia   . GERD (gastroesophageal reflux disease)   . History of anemia   . History of CVA (cerebrovascular accident)   .  Hypertension   . Myocardial infarction   . Pacemaker   . Peripheral vascular disease ([place])    Status post right femoropopliteal bypass  . Pneumonia   . Shortness of breath     Past Surgical History:  Procedure Laterality Date  . ABDOMINAL ANGIOGRAM  04/30/2013   Procedure: ABDOMINAL ANGIOGRAM;  Surgeon: Mal Misty, MD;  Location: [facility] CATH LAB;  Service: Cardiovascular;;  . AMPUTATION Left 05/05/2013   Procedure: AMPUTATION RAY- FIRST AND SECOND-LEFT FOOT;  Surgeon: Newt Minion, MD;  Location: [place];  Service: Orthopedics;  Laterality: Left;  . CARDIAC CATHETERIZATION  04/29/2002   Dr. Glade Lloyd  . CORONARY ANGIOPLASTY    . CORONARY ARTERY BYPASS GRAFT  4 of 2004   4 vessel bypass  . ESOPHAGOGASTRODUODENOSCOPY  07/21/2011   Procedure: ESOPHAGOGASTRODUODENOSCOPY (EGD);  Surgeon: Beryle Beams, MD;  Location: [facility];  Service: Endoscopy;  Laterality: N/A;  . FEMORAL BYPASS  july 2006   right with right great toe amputation  . FEMORAL-POPLITEAL BYPASS GRAFT Left 05/01/2013   Procedure: LEFT FEMORAL TO ABOVE KNEE POPLITEAL ARTERY BYPASS GRAFT WITH INTRAOPERATIVE ARTEROGRAM;  Surgeon: Mal Misty, MD;  Location: [place];  Service: Vascular;  Laterality: Left;  . INSERT / REPLACE / REMOVE PACEMAKER  05/17/2005   st. jude dual chamber ddd mode   . LOWER EXTREMITY ANGIOGRAM N/A 04/30/2013   Procedure: LOWER EXTREMITY ANGIOGRAM;  Surgeon: Mal Misty, MD;  Location[facility] CATH LAB;  Service: Cardiovascular;  Laterality: N/A;  . PACEMAKER LEAD REMOVAL Right 10/27/2013   Procedure: PACEMAKER LEAD REMOVAL;  Surgeon: Evans Lance, MD;  Location: [place];  Service: Cardiovascular;  Laterality: Right;  Roxy Manns backing up  . TEE WITHOUT CARDIOVERSION N/A 10/23/2013   Procedure: TRANSESOPHAGEAL ECHOCARDIOGRAM (TEE);  Surgeon: Dorothy Spark, MD;  Location: [place];  Service: Cardiovascular;  Laterality: N/A;  . TEE WITHOUT CARDIOVERSION N/A 10/28/2013   Procedure: TRANSESOPHAGEAL ECHOCARDIOGRAM  (TEE);  Surgeon: Dorothy Spark, MD;  Location: [facility] ENDOSCOPY;  Service: Cardiovascular;  Laterality: N/A;    History  Smoking Status  . Former Smoker  . Packs/day: 1.00  . Years: 50.00  . Types: Cigarettes  . Quit date: 06/09/2002  Smokeless Tobacco  . Never Used    History  Alcohol Use No    Family History  Problem Relation Age of Onset  . Hyperlipidemia Mother   . Heart disease Father   . Diabetes Sister   . Hypertension Sister   . Hyperlipidemia Sister   .  Diabetes      Reviw of Systems:  Reviewed in the HPI.  All other systems are negative.  Physical Exam: Blood pressure (!) 152/60, pulse (!) 38, height 5\' 10"  (1.778 m), weight 150 lb 6.4 oz (68.2 kg), SpO2 98 %. General: Well developed, well nourished, in no acute distress.  Head: Normocephalic, atraumatic, sclera non-icteric, mucus membranes are moist,   Neck: Supple. Carotids are 2 + without bruits. No JVD  Lungs: Clear bilaterally to auscultation.  Heart: regular rate.  normal  S1 S2. No murmurs, gallops or rubs.  Abdomen: Soft, non-tender, non-distended with normal bowel sounds. No hepatomegaly. No rebound/guarding. No masses.  Msk:  Strength and tone are normal  Extremities: No clubbing or cyanosis. No edema.  Distal pedal pulses are 2+ and equal bilaterally.  Neuro: Alert and oriented X 3. Moves all extremities spontaneously.  Psych:  Responds to questions appropriately with a normal affect.  ECG:  11/17/2015: Marked sinus bradycardia at a rate of 39. Has left ventricular hypertrophy with T-wave inversions in leads V5 and V6.  Assessment / Plan: .  1. Sinus bradycardia: The patient has pronounced sinus bradycardia. We'll stop the carvedilol. Will have him come  back in 3 weeks to assess his heart rate and blood pressure with the PA . I've advised him to avoid salt. He'll see Scott in 3-4 weeks for follow-up visit.  He has a history of pacemaker in the past but he developed endocarditis and the  pacemaker had to be removed. The patient has been followed by Dr. Lovena Le in the past and will ask Dr. Lovena Le if he's a candidate to have another pacemaker placed although I think this would be at some risk of recurrent endocarditis.  2. Chronic systolic congestive heart failure. He hasn't history of chronic systolic CHF. His EF is 30-35%. We'll consider getting an echocardiogram around the time of his next office visit.  3. Chronic kidney disease-followed by his primary medical doctor    Mertie Moores, MD  11/17/2015 11:36 AM    [facility] [address] [place],  [place], [place]  [address] Pager [phone] Phone: [phone]; Fax: [phone]

## 2015-11-17 NOTE — Patient Instructions (Signed)
Medication Instructions:  STOP Carvedilol (Coreg)   Labwork: None Ordered   Testing/Procedures: None Ordered   Follow-Up: Your physician recommends that you schedule a follow-up appointment in: 3-4 weeks with Richardson Dopp, PA  Your physician wants you to follow-up in: 6 months with Dr. Acie Fredrickson.  You will receive a reminder letter in the mail two months in advance. If you don't receive a letter, please call our office to schedule the follow-up appointment.    If you need a refill on your cardiac medications before your next appointment, please call your pharmacy.   Thank you for choosing [facility]! Christen Bame, RN [phone]

## 2015-11-18 DIAGNOSIS — I10 Essential (primary) hypertension: Secondary | ICD-10-CM | POA: Diagnosis not present

## 2015-11-18 DIAGNOSIS — R531 Weakness: Secondary | ICD-10-CM | POA: Diagnosis not present

## 2015-11-18 DIAGNOSIS — N184 Chronic kidney disease, stage 4 (severe): Secondary | ICD-10-CM | POA: Diagnosis not present

## 2015-11-18 DIAGNOSIS — Z951 Presence of aortocoronary bypass graft: Secondary | ICD-10-CM | POA: Diagnosis not present

## 2015-11-18 DIAGNOSIS — Z89412 Acquired absence of left great toe: Secondary | ICD-10-CM | POA: Diagnosis not present

## 2015-11-18 DIAGNOSIS — D649 Anemia, unspecified: Secondary | ICD-10-CM | POA: Diagnosis not present

## 2015-11-18 DIAGNOSIS — E1122 Type 2 diabetes mellitus with diabetic chronic kidney disease: Secondary | ICD-10-CM | POA: Diagnosis not present

## 2015-11-18 DIAGNOSIS — I2510 Atherosclerotic heart disease of native coronary artery without angina pectoris: Secondary | ICD-10-CM | POA: Diagnosis not present

## 2015-11-18 DIAGNOSIS — E1151 Type 2 diabetes mellitus with diabetic peripheral angiopathy without gangrene: Secondary | ICD-10-CM | POA: Diagnosis not present

## 2015-11-18 DIAGNOSIS — Z950 Presence of cardiac pacemaker: Secondary | ICD-10-CM | POA: Diagnosis not present

## 2015-11-18 DIAGNOSIS — J449 Chronic obstructive pulmonary disease, unspecified: Secondary | ICD-10-CM | POA: Diagnosis not present

## 2015-11-18 DIAGNOSIS — C61 Malignant neoplasm of prostate: Secondary | ICD-10-CM | POA: Diagnosis not present

## 2015-11-18 DIAGNOSIS — R2689 Other abnormalities of gait and mobility: Secondary | ICD-10-CM | POA: Diagnosis not present

## 2015-11-23 ENCOUNTER — Encounter: Payer: Self-pay | Admitting: Physician Assistant

## 2015-12-06 ENCOUNTER — Other Ambulatory Visit: Payer: Self-pay | Admitting: *Deleted

## 2015-12-06 DIAGNOSIS — I6523 Occlusion and stenosis of bilateral carotid arteries: Secondary | ICD-10-CM

## 2015-12-06 DIAGNOSIS — I7092 Chronic total occlusion of artery of the extremities: Secondary | ICD-10-CM

## 2015-12-07 ENCOUNTER — Telehealth: Payer: Self-pay | Admitting: *Deleted

## 2015-12-07 ENCOUNTER — Encounter: Payer: Self-pay | Admitting: Physician Assistant

## 2015-12-07 ENCOUNTER — Ambulatory Visit (INDEPENDENT_AMBULATORY_CARE_PROVIDER_SITE_OTHER): Payer: Medicare Other | Admitting: Physician Assistant

## 2015-12-07 VITALS — BP 142/44 | HR 41 | Ht 70.0 in | Wt 147.4 lb

## 2015-12-07 DIAGNOSIS — I739 Peripheral vascular disease, unspecified: Secondary | ICD-10-CM

## 2015-12-07 DIAGNOSIS — E7800 Pure hypercholesterolemia, unspecified: Secondary | ICD-10-CM

## 2015-12-07 DIAGNOSIS — I10 Essential (primary) hypertension: Secondary | ICD-10-CM | POA: Diagnosis not present

## 2015-12-07 DIAGNOSIS — I5022 Chronic systolic (congestive) heart failure: Secondary | ICD-10-CM | POA: Diagnosis not present

## 2015-12-07 DIAGNOSIS — R001 Bradycardia, unspecified: Secondary | ICD-10-CM | POA: Diagnosis not present

## 2015-12-07 DIAGNOSIS — E785 Hyperlipidemia, unspecified: Secondary | ICD-10-CM | POA: Insufficient documentation

## 2015-12-07 DIAGNOSIS — I2510 Atherosclerotic heart disease of native coronary artery without angina pectoris: Secondary | ICD-10-CM

## 2015-12-07 DIAGNOSIS — N183 Chronic kidney disease, stage 3 unspecified: Secondary | ICD-10-CM

## 2015-12-07 LAB — TSH: TSH: 3.44 m[IU]/L (ref 0.40–4.50)

## 2015-12-07 NOTE — Telephone Encounter (Signed)
Pt notified of lab results by phone with verbal understanding.  

## 2015-12-07 NOTE — Progress Notes (Signed)
Cardiology Office Note:    Date:  12/07/2015   ID:  Colin [name], DOB 11/02/1928, MRN [id number]  PCP:  Horatio Pel, MD  Cardiologist:  Dr. Liam Rogers   Electrophysiologist:  Dr. Cristopher Peru  VVS:  Dr. Donnetta Hutching  Referring MD: Deland Pretty, MD   Chief Complaint  Patient presents with  . Follow-up    bradycardia    History of Present Illness:    Colin [name] is a 82 y.o. male with a hx of CAD status post CABG in 8453, chronic systolic CHF, ischemic cardiomyopathy, sick sinus syndrome status post pacer in 2007 by Dr. Glade Lloyd, carotid artery disease, PAD (s/p L fem to above knee popliteal bypass in 4/15; s/p amp of L 1st and 2nd toes), HL, HTN, DM, CKD. 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 His pacemaker leads were extracted.  He did not require pacemaker reimplantation. He was last seen in 2016 by Dr. Lovena Le. He was recently seen by Dr. Acie Fredrickson on 11/17/15 with weakness and profound sinus bradycardia with a heart rate of 39. His beta blocker was stopped. He returns for close follow-up.  He is here the day with his daughter. He arrives in a wheelchair. He notes continued weakness. He has occasional dizziness. He denies syncope or near syncope. He notes dyspnea with mild to moderate activities. This is fairly stable. He notes occasional chest discomfort. This is nonexertional and not like his previous angina. He denies orthopnea, PND or edema. He denies any bleeding issues. He denies any fevers.  Prior CV studies that were reviewed today include:    Carotid US 2/17 R 50-69 L plaque  Even Monitor 1/16 Sinus brady; no pauses  TEE 10/15 EF 30-35, no RWMA, dilated LA; retained portion of RV lead in RV apex with no remnant insulation or vegetation  TEE 10/15 EF 30-35, severe nonmobile plaque and ascending thoracic aorta, mild MR, mild PI, large 25 x 4 mm highly mobile vegetation attached to the RA portion of the RV pacer lead  Echo  9/15 Mild LVH, EF 30-35, diffuse hypokinesis, grade 1 diastolic dysfunction, trivial AI, trivial MR, moderate LAE, mild RAE, mild TR, PASP 39  LHC 4/04 LM mid 60-70 LAD with first anterolateral branch 70 RCA proximal to mid 40 EF 40  Past Medical History:  Diagnosis Date  . Anemia 10/29/2013  . Anginal pain ([place])   . Arthritis   . Cardiomyopathy   . CHF (congestive heart failure) ([place]) 2007   ef 38%  . Chronic renal insufficiency    creatinine around 2.4  . COPD (chronic obstructive pulmonary disease) ([place])   . Coronary artery disease    Status post CABG in 2004  . Dementia   . Diabetes mellitus with peripheral artery disease ([place])   . Dyslipidemia   . GERD (gastroesophageal reflux disease)   . History of anemia   . History of CVA (cerebrovascular accident)   . Hypertension   . Myocardial infarction   . Pacemaker   . Peripheral vascular disease ([place])    Status post right femoropopliteal bypass  . Pneumonia   . Shortness of breath     Past Surgical History:  Procedure Laterality Date  . ABDOMINAL ANGIOGRAM  04/30/2013   Procedure: ABDOMINAL ANGIOGRAM;  Surgeon: Mal Misty, MD;  [facility] CATH LAB;  Service: Cardiovascular;;  . AMPUTATION Left 05/05/2013   Procedure: AMPUTATION RAY- FIRST AND SECOND-LEFT FOOT;  Surgeon: Newt Minion, MD;  Location: [facility]  OR;  Service: Orthopedics;  Laterality: Left;  . CARDIAC CATHETERIZATION  04/29/2002   Dr. Glade Lloyd  . CORONARY ANGIOPLASTY    . CORONARY ARTERY BYPASS GRAFT  4 of 2004   4 vessel bypass  . ESOPHAGOGASTRODUODENOSCOPY  07/21/2011   Procedure: ESOPHAGOGASTRODUODENOSCOPY (EGD);  Surgeon: Beryle Beams, MD;  Location: [facility] ENDOSCOPY;  Service: Endoscopy;  Laterality: N/A;  . FEMORAL BYPASS  july 2006   right with right great toe amputation  . FEMORAL-POPLITEAL BYPASS GRAFT Left 05/01/2013   Procedure: LEFT FEMORAL TO ABOVE KNEE POPLITEAL ARTERY BYPASS GRAFT WITH INTRAOPERATIVE ARTEROGRAM;  Surgeon: Mal Misty, MD;   Location: [place];  Service: Vascular;  Laterality: Left;  . INSERT / REPLACE / REMOVE PACEMAKER  05/17/2005   st. jude dual chamber ddd mode   . LOWER EXTREMITY ANGIOGRAM N/A 04/30/2013   Procedure: LOWER EXTREMITY ANGIOGRAM;  Surgeon: Mal Misty, MD;  Location: [facility] CATH LAB;  Service: Cardiovascular;  Laterality: N/A;  . PACEMAKER LEAD REMOVAL Right 10/27/2013   Procedure: PACEMAKER LEAD REMOVAL;  Surgeon: Evans Lance, MD;  Location: [place];  Service: Cardiovascular;  Laterality: Right;  Colin [name] backing up  . TEE WITHOUT CARDIOVERSION N/A 10/23/2013   Procedure: TRANSESOPHAGEAL ECHOCARDIOGRAM (TEE);  Surgeon: Dorothy Spark, MD;  Location: [place];  Service: Cardiovascular;  Laterality: N/A;  . TEE WITHOUT CARDIOVERSION N/A 10/28/2013   Procedure: TRANSESOPHAGEAL ECHOCARDIOGRAM (TEE);  Surgeon: Dorothy Spark, MD;  Location: [facility] ENDOSCOPY;  Service: Cardiovascular;  Laterality: N/A;    Current Medications: Current Meds  Medication Sig  . acetaminophen (TYLENOL) 325 MG tablet Take 2 tablets (650 mg total) by mouth every 6 (six) hours as needed for mild pain or fever.  Marland Kitchen aspirin EC 81 MG tablet Take 81 mg by mouth daily.  . B Complex-C (B-COMPLEX WITH VITAMIN C) tablet Take 1 tablet by mouth daily.  Marland Kitchen dextrose 4 G chewable tablet Chew 16 g by mouth daily as needed for low blood sugar.  . docusate sodium 100 MG CAPS Take 100 mg by mouth 2 (two) times daily.  Marland Kitchen ezetimibe (ZETIA) 10 MG tablet Take 1 tablet (10 mg total) by mouth daily.  . ferrous sulfate 325 (65 FE) MG tablet Take 1 tablet (325 mg total) by mouth daily with breakfast.  . furosemide (LASIX) 40 MG tablet Take 1 tablet (40 mg total) by mouth daily.  . hydrALAZINE (APRESOLINE) 10 MG tablet Take 1 tablet (10 mg total) by mouth every 8 (eight) hours.  . insulin aspart (NOVOLOG) 100 UNIT/ML injection Inject 3 Units into the skin 3 (three) times daily before meals. Reported on 02/20/2015  . insulin glargine (LANTUS) 100 UNIT/ML  injection Inject 0.1 mLs (10 Units total) into the skin at bedtime.  . isosorbide mononitrate (IMDUR) 30 MG 24 hr tablet Take 1 tablet (30 mg total) by mouth daily.  Marland Kitchen linagliptin (TRADJENTA) 5 MG TABS tablet Take 5 mg by mouth daily. Reported on 02/20/2015  . Omega-3 Fatty Acids (FISH OIL) 1000 MG CAPS Take 1,000 mg by mouth daily.   . ondansetron (ZOFRAN) 4 MG tablet Take 1 tablet (4 mg total) by mouth every 6 (six) hours as needed for nausea.  . vitamin B-12 (CYANOCOBALAMIN) 1000 MCG tablet Take 1,000 mcg by mouth daily.     Allergies:   Lisinopril   Social History   Social History  . Marital status: Married    Spouse name: N/A  . Number of children: N/A  . Years of education: N/A  Social History Main Topics  . Smoking status: Former Smoker    Packs/day: 1.00    Years: 50.00    Types: Cigarettes    Quit date: 06/09/2002  . Smokeless tobacco: Never Used  . Alcohol use No  . Drug use: No  . Sexual activity: Not Currently   Other Topics Concern  . None   Social History Narrative   Lives in a house.  Lives with his wive.  Drives.  Uses a cane for ambulation.       Family History:  The patient's family history includes Diabetes in his sister; Heart disease in his father; Hyperlipidemia in his mother and sister; Hypertension in his sister.   ROS:   Please see the history of present illness.    Review of Systems  Cardiovascular: Positive for dyspnea on exertion.   All other systems reviewed and are negative.   EKGs/Labs/Other Test Reviewed:    EKG:  EKG is  ordered today.  The ekg ordered today demonstrates Sinus bradycardia, HR 41, left axis deviation, poor R-wave progression, LVH, T-wave inversions in V5-V6  Recent Labs: No results found for requested labs within last 8760 hours.   Recent Lipid Panel    Component Value Date/Time   CHOL 160 06/09/2011 1122   TRIG 89.0 06/09/2011 1122   HDL 32.40 (L) 06/09/2011 1122   CHOLHDL 5 06/09/2011 1122   VLDL 17.8  06/09/2011 1122   LDLCALC 110 (H) 06/09/2011 1122     Physical Exam:    VS:  BP (!) 142/44   Pulse (!) 41   Ht 5\' 10"  (1.778 m)   Wt 147 lb 6.4 oz (66.9 kg)   SpO2 99%   BMI 21.15 kg/m     Wt Readings from Last 3 Encounters:  12/07/15 147 lb 6.4 oz (66.9 kg)  11/17/15 150 lb 6.4 oz (68.2 kg)  06/11/14 161 lb 9.6 oz (73.3 kg)     Physical Exam  Constitutional: He is oriented to person, place, and time. He appears well-developed. He appears cachectic. No distress.  HENT:  Head: Normocephalic and atraumatic.  Eyes: No scleral icterus.  Neck: No JVD present.  Cardiovascular: Regular rhythm, S1 normal and S2 normal.  Bradycardia present.   No murmur heard. Pulmonary/Chest: He has decreased breath sounds. He has no rales.  Abdominal: Soft. There is no tenderness.  Musculoskeletal: He exhibits no edema.  Neurological: He is alert and oriented to person, place, and time.  Skin: Skin is warm and dry.  Psychiatric: He has a normal mood and affect.    ASSESSMENT:    1. Sinus bradycardia   2. Chronic systolic CHF (congestive heart failure) ([place])   3. Coronary artery disease involving native coronary artery of native heart without angina pectoris   4. Essential hypertension   5. Pure hypercholesterolemia   6. CKD (chronic kidney disease), stage III   7. PAD (peripheral artery disease) ([facility])    PLAN:    In order of problems listed above:  1. Bradycardia - He has a history of sick sinus syndrome status post pacer in 2007. His pacemaker was extracted in 2015 in the setting of endocarditis. He had follow-up event monitor without evidence of pauses and has not required pacemaker reimplantation. The patient was recently seen by Dr. Acie Fredrickson with weakness and bradycardia with heart rate in the 30s. His beta blocker was stopped. Today, he remains in sinus brady with HR 41.  He notes weakness and dizziness.  It is difficult to  know if his symptoms are all attributed to his bradycardia.  He  denies syncope.  It is possible that he is symptomatic with his bradycardia.  I reviewed with Dr. Allegra Lai (EP) today.  We felt it was best for the patient to see Dr. Lovena Le in [place] to decide whether or not pacemaker implantation is appropriate.    -  Request recent BMET, CBC from PCP  -  Check TSH today.  2. Chronic systolic CHF - No longer on her blocker secondary to bradycardia. Continue hydralazine, nitrates. Volume stable.  3. CAD - s/p CABG in 2004.  He does have occasional atypical chest pains. These are unlike his previous angina. He would be high risk for invasive cardiac studies given his chronic kidney disease. Continue medical therapy. Consider stress testing if symptoms worsen. Continue aspirin, Zetia, nitrates  4. HTN - Blood pressure with fair control since discontinuation of carvedilol.  5. HL - Continue Zetia. LDL was 99 in 10/17.  6. CKD - Stage 3-4.  Recent creatinine 2.2 in 10/17.  7. PAD - Followed by [facility] (Dr. Donnetta Hutching).    Medication Adjustments/Labs and Tests Ordered: Current medicines are reviewed at length with the patient today.  Concerns regarding medicines are outlined above.  Medication changes, Labs and Tests ordered today are outlined in the Patient Instructions noted below. Patient Instructions  Medication Instructions:  Your physician recommends that you continue on your current medications as directed. Please refer to the Current Medication list given to you today.  Labwork: TODAY TSH  Testing/Procedures: NONE  Follow-Up: DR. Lovena Le NEXT AVAILABLE; DX BRADYCARDIA  Any Other Special Instructions Will Be Listed Below (If Applicable).  If you need a refill on your cardiac medications before your next appointment, please call your pharmacy.  Signed, Richardson Dopp, PA-C  12/07/2015 11:19 AM    [facility] [address], [place], [place]  [address] Phone: [phone]; Fax: [phone]

## 2015-12-07 NOTE — Patient Instructions (Addendum)
Medication Instructions:  Your physician recommends that you continue on your current medications as directed. Please refer to the Current Medication list given to you today.  Labwork: TODAY TSH  Testing/Procedures: NONE  Follow-Up: DR. Lovena Le NEXT AVAILABLE; DX BRADYCARDIA  Any Other Special Instructions Will Be Listed Below (If Applicable).  If you need a refill on your cardiac medications before your next appointment, please call your pharmacy.

## 2015-12-08 DIAGNOSIS — J449 Chronic obstructive pulmonary disease, unspecified: Secondary | ICD-10-CM | POA: Diagnosis not present

## 2015-12-08 DIAGNOSIS — Z950 Presence of cardiac pacemaker: Secondary | ICD-10-CM | POA: Diagnosis not present

## 2015-12-08 DIAGNOSIS — E1151 Type 2 diabetes mellitus with diabetic peripheral angiopathy without gangrene: Secondary | ICD-10-CM | POA: Diagnosis not present

## 2015-12-08 DIAGNOSIS — Z951 Presence of aortocoronary bypass graft: Secondary | ICD-10-CM | POA: Diagnosis not present

## 2015-12-08 DIAGNOSIS — I2510 Atherosclerotic heart disease of native coronary artery without angina pectoris: Secondary | ICD-10-CM | POA: Diagnosis not present

## 2015-12-08 DIAGNOSIS — I10 Essential (primary) hypertension: Secondary | ICD-10-CM | POA: Diagnosis not present

## 2015-12-08 DIAGNOSIS — D649 Anemia, unspecified: Secondary | ICD-10-CM | POA: Diagnosis not present

## 2015-12-08 DIAGNOSIS — N184 Chronic kidney disease, stage 4 (severe): Secondary | ICD-10-CM | POA: Diagnosis not present

## 2015-12-08 DIAGNOSIS — C61 Malignant neoplasm of prostate: Secondary | ICD-10-CM | POA: Diagnosis not present

## 2015-12-08 DIAGNOSIS — Z89422 Acquired absence of other left toe(s): Secondary | ICD-10-CM | POA: Diagnosis not present

## 2015-12-08 DIAGNOSIS — Z89412 Acquired absence of left great toe: Secondary | ICD-10-CM | POA: Diagnosis not present

## 2015-12-08 DIAGNOSIS — E1122 Type 2 diabetes mellitus with diabetic chronic kidney disease: Secondary | ICD-10-CM | POA: Diagnosis not present

## 2015-12-08 DIAGNOSIS — Z794 Long term (current) use of insulin: Secondary | ICD-10-CM | POA: Diagnosis not present

## 2015-12-13 DIAGNOSIS — Z794 Long term (current) use of insulin: Secondary | ICD-10-CM | POA: Diagnosis not present

## 2015-12-13 DIAGNOSIS — E1122 Type 2 diabetes mellitus with diabetic chronic kidney disease: Secondary | ICD-10-CM | POA: Diagnosis not present

## 2015-12-13 DIAGNOSIS — C61 Malignant neoplasm of prostate: Secondary | ICD-10-CM | POA: Diagnosis not present

## 2015-12-13 DIAGNOSIS — Z89422 Acquired absence of other left toe(s): Secondary | ICD-10-CM | POA: Diagnosis not present

## 2015-12-13 DIAGNOSIS — N184 Chronic kidney disease, stage 4 (severe): Secondary | ICD-10-CM | POA: Diagnosis not present

## 2015-12-13 DIAGNOSIS — I10 Essential (primary) hypertension: Secondary | ICD-10-CM | POA: Diagnosis not present

## 2015-12-13 DIAGNOSIS — Z950 Presence of cardiac pacemaker: Secondary | ICD-10-CM | POA: Diagnosis not present

## 2015-12-13 DIAGNOSIS — D649 Anemia, unspecified: Secondary | ICD-10-CM | POA: Diagnosis not present

## 2015-12-13 DIAGNOSIS — Z951 Presence of aortocoronary bypass graft: Secondary | ICD-10-CM | POA: Diagnosis not present

## 2015-12-13 DIAGNOSIS — E1151 Type 2 diabetes mellitus with diabetic peripheral angiopathy without gangrene: Secondary | ICD-10-CM | POA: Diagnosis not present

## 2015-12-13 DIAGNOSIS — I2510 Atherosclerotic heart disease of native coronary artery without angina pectoris: Secondary | ICD-10-CM | POA: Diagnosis not present

## 2015-12-13 DIAGNOSIS — Z89412 Acquired absence of left great toe: Secondary | ICD-10-CM | POA: Diagnosis not present

## 2015-12-13 DIAGNOSIS — J449 Chronic obstructive pulmonary disease, unspecified: Secondary | ICD-10-CM | POA: Diagnosis not present

## 2015-12-17 ENCOUNTER — Other Ambulatory Visit: Payer: Self-pay | Admitting: Cardiology

## 2015-12-20 ENCOUNTER — Telehealth: Payer: Self-pay | Admitting: *Deleted

## 2015-12-20 NOTE — Telephone Encounter (Signed)
I called the patient to verify that they were no longer taking the Coreg. Denying the refill requested from the pharmacy.

## 2015-12-21 DIAGNOSIS — Z89422 Acquired absence of other left toe(s): Secondary | ICD-10-CM | POA: Diagnosis not present

## 2015-12-21 DIAGNOSIS — J449 Chronic obstructive pulmonary disease, unspecified: Secondary | ICD-10-CM | POA: Diagnosis not present

## 2015-12-21 DIAGNOSIS — I2510 Atherosclerotic heart disease of native coronary artery without angina pectoris: Secondary | ICD-10-CM | POA: Diagnosis not present

## 2015-12-21 DIAGNOSIS — E1151 Type 2 diabetes mellitus with diabetic peripheral angiopathy without gangrene: Secondary | ICD-10-CM | POA: Diagnosis not present

## 2015-12-21 DIAGNOSIS — C61 Malignant neoplasm of prostate: Secondary | ICD-10-CM | POA: Diagnosis not present

## 2015-12-21 DIAGNOSIS — E1122 Type 2 diabetes mellitus with diabetic chronic kidney disease: Secondary | ICD-10-CM | POA: Diagnosis not present

## 2015-12-21 DIAGNOSIS — I10 Essential (primary) hypertension: Secondary | ICD-10-CM | POA: Diagnosis not present

## 2015-12-21 DIAGNOSIS — Z89412 Acquired absence of left great toe: Secondary | ICD-10-CM | POA: Diagnosis not present

## 2015-12-21 DIAGNOSIS — Z951 Presence of aortocoronary bypass graft: Secondary | ICD-10-CM | POA: Diagnosis not present

## 2015-12-21 DIAGNOSIS — D649 Anemia, unspecified: Secondary | ICD-10-CM | POA: Diagnosis not present

## 2015-12-21 DIAGNOSIS — Z794 Long term (current) use of insulin: Secondary | ICD-10-CM | POA: Diagnosis not present

## 2015-12-21 DIAGNOSIS — N184 Chronic kidney disease, stage 4 (severe): Secondary | ICD-10-CM | POA: Diagnosis not present

## 2015-12-21 DIAGNOSIS — Z950 Presence of cardiac pacemaker: Secondary | ICD-10-CM | POA: Diagnosis not present

## 2015-12-23 DIAGNOSIS — R2689 Other abnormalities of gait and mobility: Secondary | ICD-10-CM | POA: Diagnosis not present

## 2015-12-23 DIAGNOSIS — E1122 Type 2 diabetes mellitus with diabetic chronic kidney disease: Secondary | ICD-10-CM | POA: Diagnosis not present

## 2015-12-23 DIAGNOSIS — C61 Malignant neoplasm of prostate: Secondary | ICD-10-CM | POA: Diagnosis not present

## 2015-12-23 DIAGNOSIS — N184 Chronic kidney disease, stage 4 (severe): Secondary | ICD-10-CM | POA: Diagnosis not present

## 2015-12-25 ENCOUNTER — Other Ambulatory Visit: Payer: Self-pay | Admitting: Cardiology

## 2015-12-27 DIAGNOSIS — E1122 Type 2 diabetes mellitus with diabetic chronic kidney disease: Secondary | ICD-10-CM | POA: Diagnosis not present

## 2015-12-27 DIAGNOSIS — Z951 Presence of aortocoronary bypass graft: Secondary | ICD-10-CM | POA: Diagnosis not present

## 2015-12-27 DIAGNOSIS — Z950 Presence of cardiac pacemaker: Secondary | ICD-10-CM | POA: Diagnosis not present

## 2015-12-27 DIAGNOSIS — Z794 Long term (current) use of insulin: Secondary | ICD-10-CM | POA: Diagnosis not present

## 2015-12-27 DIAGNOSIS — Z89412 Acquired absence of left great toe: Secondary | ICD-10-CM | POA: Diagnosis not present

## 2015-12-27 DIAGNOSIS — I2510 Atherosclerotic heart disease of native coronary artery without angina pectoris: Secondary | ICD-10-CM | POA: Diagnosis not present

## 2015-12-27 DIAGNOSIS — Z89422 Acquired absence of other left toe(s): Secondary | ICD-10-CM | POA: Diagnosis not present

## 2015-12-27 DIAGNOSIS — D649 Anemia, unspecified: Secondary | ICD-10-CM | POA: Diagnosis not present

## 2015-12-27 DIAGNOSIS — N184 Chronic kidney disease, stage 4 (severe): Secondary | ICD-10-CM | POA: Diagnosis not present

## 2015-12-27 DIAGNOSIS — I10 Essential (primary) hypertension: Secondary | ICD-10-CM | POA: Diagnosis not present

## 2015-12-27 DIAGNOSIS — C61 Malignant neoplasm of prostate: Secondary | ICD-10-CM | POA: Diagnosis not present

## 2015-12-27 DIAGNOSIS — E1151 Type 2 diabetes mellitus with diabetic peripheral angiopathy without gangrene: Secondary | ICD-10-CM | POA: Diagnosis not present

## 2015-12-27 DIAGNOSIS — J449 Chronic obstructive pulmonary disease, unspecified: Secondary | ICD-10-CM | POA: Diagnosis not present

## 2015-12-27 NOTE — Telephone Encounter (Signed)
[facility] Reports   Date/Time Report Action User  11/17/2015 11:35 AM After Visit Summary Printed Emmaline Life, RN  Patient Instructions   Medication Instructions:  STOP Carvedilol (Coreg)

## 2015-12-28 DIAGNOSIS — E1151 Type 2 diabetes mellitus with diabetic peripheral angiopathy without gangrene: Secondary | ICD-10-CM | POA: Diagnosis not present

## 2015-12-28 DIAGNOSIS — J449 Chronic obstructive pulmonary disease, unspecified: Secondary | ICD-10-CM | POA: Diagnosis not present

## 2015-12-28 DIAGNOSIS — Z951 Presence of aortocoronary bypass graft: Secondary | ICD-10-CM | POA: Diagnosis not present

## 2015-12-28 DIAGNOSIS — I2510 Atherosclerotic heart disease of native coronary artery without angina pectoris: Secondary | ICD-10-CM | POA: Diagnosis not present

## 2015-12-28 DIAGNOSIS — I10 Essential (primary) hypertension: Secondary | ICD-10-CM | POA: Diagnosis not present

## 2015-12-28 DIAGNOSIS — Z794 Long term (current) use of insulin: Secondary | ICD-10-CM | POA: Diagnosis not present

## 2015-12-28 DIAGNOSIS — E1122 Type 2 diabetes mellitus with diabetic chronic kidney disease: Secondary | ICD-10-CM | POA: Diagnosis not present

## 2015-12-28 DIAGNOSIS — Z950 Presence of cardiac pacemaker: Secondary | ICD-10-CM | POA: Diagnosis not present

## 2015-12-28 DIAGNOSIS — N184 Chronic kidney disease, stage 4 (severe): Secondary | ICD-10-CM | POA: Diagnosis not present

## 2015-12-28 DIAGNOSIS — D649 Anemia, unspecified: Secondary | ICD-10-CM | POA: Diagnosis not present

## 2015-12-28 DIAGNOSIS — Z89412 Acquired absence of left great toe: Secondary | ICD-10-CM | POA: Diagnosis not present

## 2015-12-28 DIAGNOSIS — C61 Malignant neoplasm of prostate: Secondary | ICD-10-CM | POA: Diagnosis not present

## 2015-12-28 DIAGNOSIS — Z89422 Acquired absence of other left toe(s): Secondary | ICD-10-CM | POA: Diagnosis not present

## 2015-12-29 ENCOUNTER — Ambulatory Visit: Payer: Medicare Other | Admitting: Internal Medicine

## 2015-12-29 DIAGNOSIS — N184 Chronic kidney disease, stage 4 (severe): Secondary | ICD-10-CM | POA: Diagnosis not present

## 2015-12-29 DIAGNOSIS — Z89422 Acquired absence of other left toe(s): Secondary | ICD-10-CM | POA: Diagnosis not present

## 2015-12-29 DIAGNOSIS — I10 Essential (primary) hypertension: Secondary | ICD-10-CM | POA: Diagnosis not present

## 2015-12-29 DIAGNOSIS — Z951 Presence of aortocoronary bypass graft: Secondary | ICD-10-CM | POA: Diagnosis not present

## 2015-12-29 DIAGNOSIS — D649 Anemia, unspecified: Secondary | ICD-10-CM | POA: Diagnosis not present

## 2015-12-29 DIAGNOSIS — J449 Chronic obstructive pulmonary disease, unspecified: Secondary | ICD-10-CM | POA: Diagnosis not present

## 2015-12-29 DIAGNOSIS — E1122 Type 2 diabetes mellitus with diabetic chronic kidney disease: Secondary | ICD-10-CM | POA: Diagnosis not present

## 2015-12-29 DIAGNOSIS — E1151 Type 2 diabetes mellitus with diabetic peripheral angiopathy without gangrene: Secondary | ICD-10-CM | POA: Diagnosis not present

## 2015-12-29 DIAGNOSIS — Z794 Long term (current) use of insulin: Secondary | ICD-10-CM | POA: Diagnosis not present

## 2015-12-29 DIAGNOSIS — C61 Malignant neoplasm of prostate: Secondary | ICD-10-CM | POA: Diagnosis not present

## 2015-12-29 DIAGNOSIS — Z89412 Acquired absence of left great toe: Secondary | ICD-10-CM | POA: Diagnosis not present

## 2015-12-29 DIAGNOSIS — Z950 Presence of cardiac pacemaker: Secondary | ICD-10-CM | POA: Diagnosis not present

## 2015-12-29 DIAGNOSIS — I2510 Atherosclerotic heart disease of native coronary artery without angina pectoris: Secondary | ICD-10-CM | POA: Diagnosis not present

## 2015-12-30 ENCOUNTER — Encounter: Payer: Self-pay | Admitting: Internal Medicine

## 2016-01-03 DIAGNOSIS — C61 Malignant neoplasm of prostate: Secondary | ICD-10-CM | POA: Diagnosis not present

## 2016-01-03 DIAGNOSIS — N184 Chronic kidney disease, stage 4 (severe): Secondary | ICD-10-CM | POA: Diagnosis not present

## 2016-01-03 DIAGNOSIS — J449 Chronic obstructive pulmonary disease, unspecified: Secondary | ICD-10-CM | POA: Diagnosis not present

## 2016-01-03 DIAGNOSIS — I2510 Atherosclerotic heart disease of native coronary artery without angina pectoris: Secondary | ICD-10-CM | POA: Diagnosis not present

## 2016-01-03 DIAGNOSIS — Z794 Long term (current) use of insulin: Secondary | ICD-10-CM | POA: Diagnosis not present

## 2016-01-03 DIAGNOSIS — D649 Anemia, unspecified: Secondary | ICD-10-CM | POA: Diagnosis not present

## 2016-01-03 DIAGNOSIS — Z950 Presence of cardiac pacemaker: Secondary | ICD-10-CM | POA: Diagnosis not present

## 2016-01-03 DIAGNOSIS — I10 Essential (primary) hypertension: Secondary | ICD-10-CM | POA: Diagnosis not present

## 2016-01-03 DIAGNOSIS — E1122 Type 2 diabetes mellitus with diabetic chronic kidney disease: Secondary | ICD-10-CM | POA: Diagnosis not present

## 2016-01-03 DIAGNOSIS — Z951 Presence of aortocoronary bypass graft: Secondary | ICD-10-CM | POA: Diagnosis not present

## 2016-01-03 DIAGNOSIS — Z89422 Acquired absence of other left toe(s): Secondary | ICD-10-CM | POA: Diagnosis not present

## 2016-01-03 DIAGNOSIS — Z89412 Acquired absence of left great toe: Secondary | ICD-10-CM | POA: Diagnosis not present

## 2016-01-03 DIAGNOSIS — E1151 Type 2 diabetes mellitus with diabetic peripheral angiopathy without gangrene: Secondary | ICD-10-CM | POA: Diagnosis not present

## 2016-01-04 DIAGNOSIS — Z89422 Acquired absence of other left toe(s): Secondary | ICD-10-CM | POA: Diagnosis not present

## 2016-01-04 DIAGNOSIS — N184 Chronic kidney disease, stage 4 (severe): Secondary | ICD-10-CM | POA: Diagnosis not present

## 2016-01-04 DIAGNOSIS — Z89412 Acquired absence of left great toe: Secondary | ICD-10-CM | POA: Diagnosis not present

## 2016-01-04 DIAGNOSIS — Z950 Presence of cardiac pacemaker: Secondary | ICD-10-CM | POA: Diagnosis not present

## 2016-01-04 DIAGNOSIS — I10 Essential (primary) hypertension: Secondary | ICD-10-CM | POA: Diagnosis not present

## 2016-01-04 DIAGNOSIS — D649 Anemia, unspecified: Secondary | ICD-10-CM | POA: Diagnosis not present

## 2016-01-04 DIAGNOSIS — Z794 Long term (current) use of insulin: Secondary | ICD-10-CM | POA: Diagnosis not present

## 2016-01-04 DIAGNOSIS — E1122 Type 2 diabetes mellitus with diabetic chronic kidney disease: Secondary | ICD-10-CM | POA: Diagnosis not present

## 2016-01-04 DIAGNOSIS — C61 Malignant neoplasm of prostate: Secondary | ICD-10-CM | POA: Diagnosis not present

## 2016-01-04 DIAGNOSIS — I2510 Atherosclerotic heart disease of native coronary artery without angina pectoris: Secondary | ICD-10-CM | POA: Diagnosis not present

## 2016-01-04 DIAGNOSIS — E1151 Type 2 diabetes mellitus with diabetic peripheral angiopathy without gangrene: Secondary | ICD-10-CM | POA: Diagnosis not present

## 2016-01-04 DIAGNOSIS — J449 Chronic obstructive pulmonary disease, unspecified: Secondary | ICD-10-CM | POA: Diagnosis not present

## 2016-01-04 DIAGNOSIS — Z951 Presence of aortocoronary bypass graft: Secondary | ICD-10-CM | POA: Diagnosis not present

## 2016-01-10 DIAGNOSIS — I2510 Atherosclerotic heart disease of native coronary artery without angina pectoris: Secondary | ICD-10-CM | POA: Diagnosis not present

## 2016-01-10 DIAGNOSIS — N184 Chronic kidney disease, stage 4 (severe): Secondary | ICD-10-CM | POA: Diagnosis not present

## 2016-01-10 DIAGNOSIS — Z89422 Acquired absence of other left toe(s): Secondary | ICD-10-CM | POA: Diagnosis not present

## 2016-01-10 DIAGNOSIS — E1151 Type 2 diabetes mellitus with diabetic peripheral angiopathy without gangrene: Secondary | ICD-10-CM | POA: Diagnosis not present

## 2016-01-10 DIAGNOSIS — I10 Essential (primary) hypertension: Secondary | ICD-10-CM | POA: Diagnosis not present

## 2016-01-10 DIAGNOSIS — D649 Anemia, unspecified: Secondary | ICD-10-CM | POA: Diagnosis not present

## 2016-01-10 DIAGNOSIS — C61 Malignant neoplasm of prostate: Secondary | ICD-10-CM | POA: Diagnosis not present

## 2016-01-10 DIAGNOSIS — Z89412 Acquired absence of left great toe: Secondary | ICD-10-CM | POA: Diagnosis not present

## 2016-01-10 DIAGNOSIS — Z950 Presence of cardiac pacemaker: Secondary | ICD-10-CM | POA: Diagnosis not present

## 2016-01-10 DIAGNOSIS — J449 Chronic obstructive pulmonary disease, unspecified: Secondary | ICD-10-CM | POA: Diagnosis not present

## 2016-01-10 DIAGNOSIS — E1122 Type 2 diabetes mellitus with diabetic chronic kidney disease: Secondary | ICD-10-CM | POA: Diagnosis not present

## 2016-01-10 DIAGNOSIS — Z794 Long term (current) use of insulin: Secondary | ICD-10-CM | POA: Diagnosis not present

## 2016-01-10 DIAGNOSIS — Z951 Presence of aortocoronary bypass graft: Secondary | ICD-10-CM | POA: Diagnosis not present

## 2016-01-12 DIAGNOSIS — Z89422 Acquired absence of other left toe(s): Secondary | ICD-10-CM | POA: Diagnosis not present

## 2016-01-12 DIAGNOSIS — I2510 Atherosclerotic heart disease of native coronary artery without angina pectoris: Secondary | ICD-10-CM | POA: Diagnosis not present

## 2016-01-12 DIAGNOSIS — Z89412 Acquired absence of left great toe: Secondary | ICD-10-CM | POA: Diagnosis not present

## 2016-01-12 DIAGNOSIS — Z794 Long term (current) use of insulin: Secondary | ICD-10-CM | POA: Diagnosis not present

## 2016-01-12 DIAGNOSIS — I10 Essential (primary) hypertension: Secondary | ICD-10-CM | POA: Diagnosis not present

## 2016-01-12 DIAGNOSIS — D649 Anemia, unspecified: Secondary | ICD-10-CM | POA: Diagnosis not present

## 2016-01-12 DIAGNOSIS — C61 Malignant neoplasm of prostate: Secondary | ICD-10-CM | POA: Diagnosis not present

## 2016-01-12 DIAGNOSIS — Z950 Presence of cardiac pacemaker: Secondary | ICD-10-CM | POA: Diagnosis not present

## 2016-01-12 DIAGNOSIS — J449 Chronic obstructive pulmonary disease, unspecified: Secondary | ICD-10-CM | POA: Diagnosis not present

## 2016-01-12 DIAGNOSIS — Z951 Presence of aortocoronary bypass graft: Secondary | ICD-10-CM | POA: Diagnosis not present

## 2016-01-12 DIAGNOSIS — E1122 Type 2 diabetes mellitus with diabetic chronic kidney disease: Secondary | ICD-10-CM | POA: Diagnosis not present

## 2016-01-12 DIAGNOSIS — N184 Chronic kidney disease, stage 4 (severe): Secondary | ICD-10-CM | POA: Diagnosis not present

## 2016-01-12 DIAGNOSIS — E1151 Type 2 diabetes mellitus with diabetic peripheral angiopathy without gangrene: Secondary | ICD-10-CM | POA: Diagnosis not present

## 2016-01-13 ENCOUNTER — Encounter: Payer: Self-pay | Admitting: Vascular Surgery

## 2016-01-19 DIAGNOSIS — J449 Chronic obstructive pulmonary disease, unspecified: Secondary | ICD-10-CM | POA: Diagnosis not present

## 2016-01-19 DIAGNOSIS — C61 Malignant neoplasm of prostate: Secondary | ICD-10-CM | POA: Diagnosis not present

## 2016-01-19 DIAGNOSIS — I2510 Atherosclerotic heart disease of native coronary artery without angina pectoris: Secondary | ICD-10-CM | POA: Diagnosis not present

## 2016-01-19 DIAGNOSIS — Z89412 Acquired absence of left great toe: Secondary | ICD-10-CM | POA: Diagnosis not present

## 2016-01-19 DIAGNOSIS — N184 Chronic kidney disease, stage 4 (severe): Secondary | ICD-10-CM | POA: Diagnosis not present

## 2016-01-19 DIAGNOSIS — I10 Essential (primary) hypertension: Secondary | ICD-10-CM | POA: Diagnosis not present

## 2016-01-19 DIAGNOSIS — Z951 Presence of aortocoronary bypass graft: Secondary | ICD-10-CM | POA: Diagnosis not present

## 2016-01-19 DIAGNOSIS — E1122 Type 2 diabetes mellitus with diabetic chronic kidney disease: Secondary | ICD-10-CM | POA: Diagnosis not present

## 2016-01-19 DIAGNOSIS — D649 Anemia, unspecified: Secondary | ICD-10-CM | POA: Diagnosis not present

## 2016-01-19 DIAGNOSIS — Z89422 Acquired absence of other left toe(s): Secondary | ICD-10-CM | POA: Diagnosis not present

## 2016-01-19 DIAGNOSIS — E1151 Type 2 diabetes mellitus with diabetic peripheral angiopathy without gangrene: Secondary | ICD-10-CM | POA: Diagnosis not present

## 2016-01-19 DIAGNOSIS — Z950 Presence of cardiac pacemaker: Secondary | ICD-10-CM | POA: Diagnosis not present

## 2016-01-19 DIAGNOSIS — Z794 Long term (current) use of insulin: Secondary | ICD-10-CM | POA: Diagnosis not present

## 2016-01-25 ENCOUNTER — Encounter (HOSPITAL_COMMUNITY): Payer: Medicare Other

## 2016-01-25 ENCOUNTER — Ambulatory Visit: Payer: Medicare Other | Admitting: Vascular Surgery

## 2016-01-25 DIAGNOSIS — E1151 Type 2 diabetes mellitus with diabetic peripheral angiopathy without gangrene: Secondary | ICD-10-CM | POA: Diagnosis not present

## 2016-01-25 DIAGNOSIS — I10 Essential (primary) hypertension: Secondary | ICD-10-CM | POA: Diagnosis not present

## 2016-01-25 DIAGNOSIS — N184 Chronic kidney disease, stage 4 (severe): Secondary | ICD-10-CM | POA: Diagnosis not present

## 2016-01-25 DIAGNOSIS — J449 Chronic obstructive pulmonary disease, unspecified: Secondary | ICD-10-CM | POA: Diagnosis not present

## 2016-02-04 ENCOUNTER — Ambulatory Visit: Payer: Medicare Other | Admitting: Internal Medicine

## 2016-02-08 ENCOUNTER — Ambulatory Visit: Payer: Medicare Other | Admitting: Internal Medicine

## 2016-02-29 ENCOUNTER — Encounter (HOSPITAL_COMMUNITY): Payer: Self-pay | Admitting: Emergency Medicine

## 2016-02-29 ENCOUNTER — Emergency Department (HOSPITAL_COMMUNITY)
Admission: EM | Admit: 2016-02-29 | Discharge: 2016-02-29 | Disposition: A | Discharge status: Home / Self Care | Payer: Medicare Other | Attending: Emergency Medicine | Admitting: Emergency Medicine

## 2016-02-29 ENCOUNTER — Emergency Department (HOSPITAL_COMMUNITY): Payer: Medicare Other

## 2016-02-29 DIAGNOSIS — E1151 Type 2 diabetes mellitus with diabetic peripheral angiopathy without gangrene: Secondary | ICD-10-CM | POA: Diagnosis not present

## 2016-02-29 DIAGNOSIS — Z7982 Long term (current) use of aspirin: Secondary | ICD-10-CM | POA: Diagnosis not present

## 2016-02-29 DIAGNOSIS — I130 Hypertensive heart and chronic kidney disease with heart failure and stage 1 through stage 4 chronic kidney disease, or unspecified chronic kidney disease: Secondary | ICD-10-CM | POA: Diagnosis not present

## 2016-02-29 DIAGNOSIS — I252 Old myocardial infarction: Secondary | ICD-10-CM | POA: Insufficient documentation

## 2016-02-29 DIAGNOSIS — Z79899 Other long term (current) drug therapy: Secondary | ICD-10-CM | POA: Diagnosis not present

## 2016-02-29 DIAGNOSIS — Z794 Long term (current) use of insulin: Secondary | ICD-10-CM | POA: Diagnosis not present

## 2016-02-29 DIAGNOSIS — R319 Hematuria, unspecified: Secondary | ICD-10-CM | POA: Diagnosis not present

## 2016-02-29 DIAGNOSIS — E1122 Type 2 diabetes mellitus with diabetic chronic kidney disease: Secondary | ICD-10-CM | POA: Diagnosis not present

## 2016-02-29 DIAGNOSIS — Z950 Presence of cardiac pacemaker: Secondary | ICD-10-CM | POA: Insufficient documentation

## 2016-02-29 DIAGNOSIS — Z87891 Personal history of nicotine dependence: Secondary | ICD-10-CM | POA: Insufficient documentation

## 2016-02-29 DIAGNOSIS — R590 Localized enlarged lymph nodes: Secondary | ICD-10-CM | POA: Diagnosis not present

## 2016-02-29 DIAGNOSIS — Z951 Presence of aortocoronary bypass graft: Secondary | ICD-10-CM | POA: Diagnosis not present

## 2016-02-29 DIAGNOSIS — N400 Enlarged prostate without lower urinary tract symptoms: Secondary | ICD-10-CM | POA: Diagnosis not present

## 2016-02-29 DIAGNOSIS — I5022 Chronic systolic (congestive) heart failure: Secondary | ICD-10-CM | POA: Diagnosis not present

## 2016-02-29 DIAGNOSIS — I2510 Atherosclerotic heart disease of native coronary artery without angina pectoris: Secondary | ICD-10-CM | POA: Insufficient documentation

## 2016-02-29 DIAGNOSIS — J449 Chronic obstructive pulmonary disease, unspecified: Secondary | ICD-10-CM | POA: Insufficient documentation

## 2016-02-29 DIAGNOSIS — N183 Chronic kidney disease, stage 3 (moderate): Secondary | ICD-10-CM | POA: Insufficient documentation

## 2016-02-29 DIAGNOSIS — Z8673 Personal history of transient ischemic attack (TIA), and cerebral infarction without residual deficits: Secondary | ICD-10-CM | POA: Diagnosis not present

## 2016-02-29 LAB — URINALYSIS, ROUTINE W REFLEX MICROSCOPIC
BILIRUBIN URINE: NEGATIVE
Glucose, UA: NEGATIVE mg/dL
HGB URINE DIPSTICK: NEGATIVE
Ketones, ur: NEGATIVE mg/dL
LEUKOCYTES UA: NEGATIVE
Nitrite: NEGATIVE
PH: 5 (ref 5.0–8.0)
Protein, ur: 30 mg/dL — AB
SPECIFIC GRAVITY, URINE: 1.016 (ref 1.005–1.030)

## 2016-02-29 LAB — COMPREHENSIVE METABOLIC PANEL
ALT: 12 U/L — ABNORMAL LOW (ref 17–63)
ANION GAP: 6 (ref 5–15)
AST: 24 U/L (ref 15–41)
Albumin: 3 g/dL — ABNORMAL LOW (ref 3.5–5.0)
Alkaline Phosphatase: 52 U/L (ref 38–126)
BUN: 75 mg/dL — AB (ref 6–20)
CHLORIDE: 109 mmol/L (ref 101–111)
CO2: 21 mmol/L — ABNORMAL LOW (ref 22–32)
Calcium: 9.6 mg/dL (ref 8.9–10.3)
Creatinine, Ser: 2.23 mg/dL — ABNORMAL HIGH (ref 0.61–1.24)
GFR calc Af Amer: 29 mL/min — ABNORMAL LOW (ref >60)
GFR calc non Af Amer: 25 mL/min — ABNORMAL LOW (ref >60)
GLUCOSE: 117 mg/dL — AB (ref 65–99)
Potassium: 5.4 mmol/L — ABNORMAL HIGH (ref 3.5–5.1)
SODIUM: 136 mmol/L (ref 135–145)
Total Bilirubin: 0.5 mg/dL (ref 0.3–1.2)
Total Protein: 6.6 g/dL (ref 6.5–8.1)

## 2016-02-29 LAB — CBC
HEMATOCRIT: 29.9 % — AB (ref 39.0–52.0)
HEMOGLOBIN: 9.3 g/dL — AB (ref 13.0–17.0)
MCH: 27.2 pg (ref 26.0–34.0)
MCHC: 31.1 g/dL (ref 30.0–36.0)
MCV: 87.4 fL (ref 78.0–100.0)
Platelets: 229 10*3/uL (ref 150–400)
RBC: 3.42 MIL/uL — ABNORMAL LOW (ref 4.22–5.81)
RDW: 14.9 % (ref 11.5–15.5)
WBC: 5.2 10*3/uL (ref 4.0–10.5)

## 2016-02-29 NOTE — ED Notes (Signed)
ED Provider at bedside. 

## 2016-02-29 NOTE — ED Notes (Signed)
Pt driven home by family. Daughter verbalizes understanding of pt need to follow up with urologist and PCP.

## 2016-02-29 NOTE — Discharge Instructions (Signed)
The CAT scan showed abnormal enlargement of the lymph nodes as well as an enlargement of the prostate.  This raises the concerns of prostate cancer or lymphoma. Follow-up with your primary doctor and urologist for further evaluation such as a biopsy or other testing.

## 2016-02-29 NOTE — ED Provider Notes (Signed)
[facility] DEPT Provider Note    By signing my name below, I, Bea Graff, attest that this documentation has been prepared under the direction and in the presence of Dorie Rank, MD. Electronically Signed: Bea Graff, ED Scribe. 02/29/16. 5:17 PM.    History   Chief Complaint Chief Complaint  Patient presents with  . Hematuria    The history is provided by the patient and medical records. No language interpreter was used.     Colin [name] is a 83 y.o. male with PMHx of prostate cancer, CHF, COPD, CAD, DM, HLD, HTN and chronic renal insufficiency who presents to the Emergency Department complaining of hematuria that began last week. He reports associated mild suprapubic pain and states he feels as if he has to strain to urinate. He has not taken anything for pain relief. He denies modifying factors. He denies fever ,chills, nausea, vomiting, dysuria or frequency.   Past Medical History:  Diagnosis Date  . Anemia 10/29/2013  . Anginal pain ([place])   . Arthritis   . Cardiomyopathy   . CHF (congestive heart failure) ([place]) 2007   ef 38%  . Chronic renal insufficiency    creatinine around 2.4  . COPD (chronic obstructive pulmonary disease) ([place])   . Coronary artery disease    Status post CABG in 2004  . Dementia   . Diabetes mellitus with peripheral artery disease ([place])   . Dyslipidemia   . GERD (gastroesophageal reflux disease)   . History of anemia   . History of CVA (cerebrovascular accident)   . Hypertension   . Myocardial infarction   . Pacemaker   . Peripheral vascular disease ([place])    Status post right femoropopliteal bypass  . Pneumonia   . Shortness of breath     Patient Active Problem List   Diagnosis Date Noted  . Essential hypertension 12/07/2015  . HLD (hyperlipidemia) 12/07/2015  . Sinus bradycardia 11/17/2015  . 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 Anemia, iron deficiency 11/06/2013    . Anemia of chronic disease 11/06/2013  . Acute blood loss anemia 10/29/2013  . Weakness generalized 10/20/2013  . Bacteremia 10/20/2013  . Type 2 diabetes, uncontrolled, with renal manifestation () 06/03/2013  . PAD (peripheral artery disease) ([place]) 04/30/2013  . Other stiff joint, of the lower leg 12/31/2012  . Fall 07/01/2012  . Hypoglycemia 07/01/2012  . Hypothermia 07/01/2012  . Failure to thrive in adult 02/27/2012  . Non-compliant patient 02/27/2012  . Microscopic hematuria 01/11/2012  . Syncope 01/09/2012  . CKD (chronic kidney disease), stage III 01/09/2012  . Diabetes mellitus with peripheral artery disease ([place])   . Pain in limb 12/29/2011  . Chronic systolic CHF (congestive heart failure) ([employer])   . Acute kidney injury ([place])   . History of CVA (cerebrovascular accident)   . Ischemic cardiomyopathy   . COPD (chronic obstructive pulmonary disease) ([place])   . History of anemia   . Coronary artery disease involving native heart without angina pectoris 11/30/2009  . Cardiac pacemaker in situ 11/30/2009    Past Surgical History:  Procedure Laterality Date  . ABDOMINAL ANGIOGRAM  04/30/2013   Procedure: ABDOMINAL ANGIOGRAM;  Surgeon: Mal Misty, MD;  Location: [facility] CATH LAB;  Service: Cardiovascular;;  . AMPUTATION Left 05/05/2013   Procedure: AMPUTATION RAY- FIRST AND SECOND-LEFT FOOT;  Surgeon: Newt Minion, MD;  Location: [facility];  Service: Orthopedics;  Laterality: Left;  . CARDIAC CATHETERIZATION  04/29/2002  Dr. Glade Lloyd  . CORONARY ANGIOPLASTY    . CORONARY ARTERY BYPASS GRAFT  4 of 2004   4 vessel bypass  . ESOPHAGOGASTRODUODENOSCOPY  07/21/2011   Procedure: ESOPHAGOGASTRODUODENOSCOPY (EGD);  Surgeon: Beryle Beams, MD;  Location: [facility];  Service: Endoscopy;  Laterality: N/A;  . FEMORAL BYPASS  july 2006   right with right great toe amputation  . FEMORAL-POPLITEAL BYPASS GRAFT Left 05/01/2013   Procedure: LEFT FEMORAL TO ABOVE KNEE POPLITEAL ARTERY BYPASS  GRAFT WITH INTRAOPERATIVE ARTEROGRAM;  Surgeon: Mal Misty, MD;  Location: [facility];  Service: Vascular;  Laterality: Left;  . INSERT / REPLACE / REMOVE PACEMAKER  05/17/2005   [facility] [facility] dual chamber ddd mode   . LOWER EXTREMITY ANGIOGRAM N/A 04/30/2013   Procedure: LOWER EXTREMITY ANGIOGRAM;  Surgeon: Mal Misty, MD;  Location: [facility] CATH LAB;  Service: Cardiovascular;  Laterality: N/A;  . PACEMAKER LEAD REMOVAL Right 10/27/2013   Procedure: PACEMAKER LEAD REMOVAL;  Surgeon: Evans Lance, MD;  Location: [facility];  Service: Cardiovascular;  Laterality: Right;  Roxy Manns backing up  . TEE WITHOUT CARDIOVERSION N/A 10/23/2013   Procedure: TRANSESOPHAGEAL ECHOCARDIOGRAM (TEE);  Surgeon: Dorothy Spark, MD;  Location: [facility];  Service: Cardiovascular;  Laterality: N/A;  . TEE WITHOUT CARDIOVERSION N/A 10/28/2013   Procedure: TRANSESOPHAGEAL ECHOCARDIOGRAM (TEE);  Surgeon: Dorothy Spark, MD;  Location: [facility] ENDOSCOPY;  Service: Cardiovascular;  Laterality: N/A;       Home Medications    Prior to Admission medications   Medication Sig Start Date End Date Taking? Authorizing Provider  acetaminophen (TYLENOL) 325 MG tablet Take 2 tablets (650 mg total) by mouth every 6 (six) hours as needed for mild pain or fever. 10/31/13   Delfina Redwood, MD  aspirin EC 81 MG tablet Take 81 mg by mouth daily.    Historical Provider, MD  B Complex-C (B-COMPLEX WITH VITAMIN C) tablet Take 1 tablet by mouth daily.    Historical Provider, MD  dextrose 4 G chewable tablet Chew 16 g by mouth daily as needed for low blood sugar.    Historical Provider, MD  docusate sodium 100 MG CAPS Take 100 mg by mouth 2 (two) times daily. 10/31/13   Delfina Redwood, MD  ezetimibe (ZETIA) 10 MG tablet Take 1 tablet (10 mg total) by mouth daily. 11/17/15   Thayer Headings, MD  ferrous sulfate 325 (65 FE) MG tablet Take 1 tablet (325 mg total) by mouth daily with breakfast. 10/31/13   Delfina Redwood, MD  furosemide (LASIX) 40  MG tablet Take 1 tablet (40 mg total) by mouth daily. 11/17/15   Thayer Headings, MD  hydrALAZINE (APRESOLINE) 10 MG tablet Take 1 tablet (10 mg total) by mouth every 8 (eight) hours. 11/17/15   Thayer Headings, MD  insulin aspart (NOVOLOG) 100 UNIT/ML injection Inject 3 Units into the skin 3 (three) times daily before meals. Reported on 02/20/2015    Historical Provider, MD  insulin glargine (LANTUS) 100 UNIT/ML injection Inject 0.1 mLs (10 Units total) into the skin at bedtime. 10/31/13   Delfina Redwood, MD  isosorbide mononitrate (IMDUR) 30 MG 24 hr tablet Take 1 tablet (30 mg total) by mouth daily. 11/17/15   Thayer Headings, MD  linagliptin (TRADJENTA) 5 MG TABS tablet Take 5 mg by mouth daily. Reported on 02/20/2015    Historical Provider, MD  Omega-3 Fatty Acids (FISH OIL) 1000 MG CAPS Take 1,000 mg by mouth daily.  Historical Provider, MD  ondansetron (ZOFRAN) 4 MG tablet Take 1 tablet (4 mg total) by mouth every 6 (six) hours as needed for nausea. 10/31/13   Delfina Redwood, MD  vitamin B-12 (CYANOCOBALAMIN) 1000 MCG tablet Take 1,000 mcg by mouth daily.    Historical Provider, MD    Family History Family History  Problem Relation Age of Onset  . Hyperlipidemia Mother   . Heart disease Father   . Diabetes Sister   . Hypertension Sister   . Hyperlipidemia Sister   . Diabetes      Social History Social History  Substance Use Topics  . Smoking status: Former Smoker    Packs/day: 1.00    Years: 50.00    Types: Cigarettes    Quit date: 06/09/2002  . Smokeless tobacco: Never Used  . Alcohol use No     Allergies   Lisinopril   Review of Systems Review of Systems  Gastrointestinal: Positive for abdominal pain.  Genitourinary: Positive for hematuria.  All other systems reviewed and are negative.    Physical Exam Updated Vital Signs BP 142/56 (BP Location: Right Arm)   Pulse (!) 54   Temp 97.5 F (36.4 C) (Oral)   Resp 16   SpO2 96%   Physical Exam    Constitutional: No distress.  Thin, elderly male  HENT:  Head: Normocephalic and atraumatic.  Right Ear: External ear normal.  Left Ear: External ear normal.  Eyes: Conjunctivae are normal. Right eye exhibits no discharge. Left eye exhibits no discharge. No scleral icterus.  Neck: Neck supple. No tracheal deviation present.  Cardiovascular: Normal rate, regular rhythm and intact distal pulses.   Pulmonary/Chest: Effort normal and breath sounds normal. No stridor. No respiratory distress. He has no wheezes. He has no rales.  Abdominal: Soft. Bowel sounds are normal. He exhibits no distension. There is no tenderness. There is no rebound and no guarding.  Musculoskeletal: He exhibits no edema or tenderness.  Neurological: He is alert. He has normal strength. No cranial nerve deficit (no facial droop, extraocular movements intact, no slurred speech) or sensory deficit. He exhibits normal muscle tone. He displays no seizure activity. Coordination normal.  Skin: Skin is warm and dry. No rash noted.  Psychiatric: He has a normal mood and affect.  Nursing note and vitals reviewed.    ED Treatments / Results  DIAGNOSTIC STUDIES: Oxygen Saturation is 96% on RA, normal by my interpretation.   COORDINATION OF CARE: 3:55 PM- Will order labs, imaging and urinalysis. Pt verbalizes understanding and agrees to plan.  Medications - No data to display  Labs (all labs ordered are listed, but only abnormal results are displayed) Labs Reviewed  COMPREHENSIVE METABOLIC PANEL - Abnormal; Notable for the following:       Result Value   Potassium 5.4 (*)    CO2 21 (*)    Glucose, Bld 117 (*)    BUN 75 (*)    Creatinine, Ser 2.23 (*)    Albumin 3.0 (*)    ALT 12 (*)    GFR calc non Af Amer 25 (*)    GFR calc Af Amer 29 (*)    All other components within normal limits  CBC - Abnormal; Notable for the following:    RBC 3.42 (*)    Hemoglobin 9.3 (*)    HCT 29.9 (*)    All other components within  normal limits  URINALYSIS, ROUTINE W REFLEX MICROSCOPIC - Abnormal; Notable for the following:    Protein,  ur 30 (*)    Bacteria, UA RARE (*)    Squamous Epithelial / LPF 0-5 (*)    All other components within normal limits    EKG  EKG Interpretation None       Radiology Ct Abdomen Pelvis Wo Contrast  Addendum Date: 02/29/2016   ADDENDUM REPORT: 02/29/2016 17:41 ADDENDUM: These results were called by telephone at the time of interpretation on 02/29/2016 at 4:44 pm to Dr. Dorie Rank , who verbally acknowledged these results. Electronically Signed   By: Fidela Salisbury M.D.   On: 02/29/2016 17:41   Result Date: 02/29/2016 CLINICAL DATA:  Hematuria for 3 days. EXAM: CT ABDOMEN AND PELVIS WITHOUT CONTRAST TECHNIQUE: Multidetector CT imaging of the abdomen and pelvis was performed following the standard protocol without IV contrast. COMPARISON:  None. FINDINGS: Lower chest: Mild emphysematous changes. Enlarged heart. Advanced calcific atherosclerotic disease of the coronary arteries and aorta. Probable valve prostheses. Hepatobiliary: No focal liver abnormality is seen. Status post cholecystectomy. No biliary dilatation. Pancreas: Unremarkable. No pancreatic ductal dilatation or surrounding inflammatory changes. Spleen: Normal in size without focal abnormality. Adrenals/Urinary Tract: Adrenal glands are unremarkable. Kidneys are normal, without renal calculi, focal lesion, or hydronephrosis. Diffuse urinary bladder wall thickening. Stomach/Bowel: Stomach is within normal limits. Appendix appears normal. No evidence of bowel wall thickening, distention, or inflammatory changes. Vascular/Lymphatic: Marked retroperitoneal abdominal and pelvic lymphadenopathy with large nodal masses measuring up to 4.5 cm. Reproductive: Heterogeneous enlargement of the prostate gland. Other: No abdominal wall hernia or abnormality. No abdominopelvic ascites. Musculoskeletal: Multilevel osteoarthritic changes of the spine  seen. Multiple endplate based lytic changes, likely representing degenerative Schmorl's nodes. Osteolytic lesion within the posterior aspect of the T12 vertebral body, indeterminate. IMPRESSION: No evidence of obstructive uropathy. Diffuse urinary bladder wall thickening, possibly due to outlet obstruction. Heterogeneous enlargement of the prostate gland. Please correlate to patient's PSA values. Marked retroperitoneal abdominal and pelvic lymphadenopathy. Differential diagnosis includes lymphoma versus metastatic lymphadenopathy, potentially from prostate cancer. Tissue sampling may be considered if no clinical explanation is available. Lytic lesion within T12 vertebral body, indeterminate, on the background of multilevel osteoarthritic changes and multiple Schmorl's nodes in the visualized portion of the spine. Electronically Signed: By: Fidela Salisbury M.D. On: 02/29/2016 16:35    Procedures Procedures (including critical care time)  Medications Ordered in ED Medications - No data to display   Initial Impression / Assessment and Plan / ED Course  I have reviewed the triage vital signs and the nursing notes.  Pertinent labs & imaging results that were available during my care of the patient were reviewed by me and considered in my medical decision making (see chart for details).   Patient's laboratory tests show a chronic renal insufficiency with an elevated BUN but no evidence of acute bladder outlet obstruction. I doubt acute renal failure.  The patient is anemic but his Hemoglobin is stable.urinalysis does not show evidence of infection or significant hematuria   patient CT scan unfortunately shows enlargement of the prostate as well as significant enlargement of lymph nodes suggesting the possibility of lymphoma versus metastatic lymphadenopathy from prostate cancer.  Discussed findings with the patient and his daughter. We discussed close outpatient follow-up with his urologist and  primary care doctor for further evaluation, possible biopsy testing. Final Clinical Impressions(s) / ED Diagnoses   Final diagnoses:  Retroperitoneal lymphadenopathy  Enlarged prostate   I personally performed the services described in this documentation, which was scribed in my presence.  The recorded information has  been reviewed and is accurate.     Dorie Rank, MD 02/29/16 [facility]

## 2016-02-29 NOTE — ED Triage Notes (Signed)
Pt here for hematuria with urination x 1 week; pt denies noticing any blood today

## 2016-03-06 ENCOUNTER — Other Ambulatory Visit (HOSPITAL_COMMUNITY): Payer: Self-pay | Admitting: Urology

## 2016-03-06 DIAGNOSIS — C61 Malignant neoplasm of prostate: Secondary | ICD-10-CM

## 2016-03-06 DIAGNOSIS — R350 Frequency of micturition: Secondary | ICD-10-CM | POA: Diagnosis not present

## 2016-03-06 DIAGNOSIS — R972 Elevated prostate specific antigen [PSA]: Secondary | ICD-10-CM | POA: Diagnosis not present

## 2016-03-06 DIAGNOSIS — R590 Localized enlarged lymph nodes: Secondary | ICD-10-CM | POA: Diagnosis not present

## 2016-03-06 DIAGNOSIS — N184 Chronic kidney disease, stage 4 (severe): Secondary | ICD-10-CM | POA: Diagnosis not present

## 2016-03-13 ENCOUNTER — Ambulatory Visit: Payer: Medicare Other | Admitting: Internal Medicine

## 2016-03-14 ENCOUNTER — Encounter (HOSPITAL_COMMUNITY)
Admission: RE | Admit: 2016-03-14 | Discharge: 2016-03-14 | Disposition: A | Discharge status: Home / Self Care | Payer: Medicare Other | Source: Ambulatory Visit | Attending: Urology | Admitting: Urology

## 2016-03-14 ENCOUNTER — Ambulatory Visit (HOSPITAL_COMMUNITY)
Admission: RE | Admit: 2016-03-14 | Discharge: 2016-03-14 | Disposition: A | Discharge status: Home / Self Care | Payer: Medicare Other | Source: Ambulatory Visit | Attending: Urology | Admitting: Urology

## 2016-03-14 DIAGNOSIS — C61 Malignant neoplasm of prostate: Secondary | ICD-10-CM | POA: Insufficient documentation

## 2016-03-14 MED ORDER — TECHNETIUM TC 99M MEDRONATE IV KIT
25.0000 | PACK | Freq: Once | INTRAVENOUS | Status: AC | PRN
  Administered 2016-03-14: 25 via INTRAVENOUS

## 2016-03-22 ENCOUNTER — Ambulatory Visit: Payer: Medicare Other | Admitting: Internal Medicine

## 2016-03-27 DIAGNOSIS — R590 Localized enlarged lymph nodes: Secondary | ICD-10-CM | POA: Diagnosis not present

## 2016-03-27 DIAGNOSIS — C61 Malignant neoplasm of prostate: Secondary | ICD-10-CM | POA: Diagnosis not present

## 2016-03-27 DIAGNOSIS — N184 Chronic kidney disease, stage 4 (severe): Secondary | ICD-10-CM | POA: Diagnosis not present

## 2016-03-27 DIAGNOSIS — R972 Elevated prostate specific antigen [PSA]: Secondary | ICD-10-CM | POA: Diagnosis not present

## 2016-04-05 ENCOUNTER — Ambulatory Visit: Payer: Medicare Other | Admitting: Physician Assistant

## 2016-04-06 DIAGNOSIS — N184 Chronic kidney disease, stage 4 (severe): Secondary | ICD-10-CM | POA: Diagnosis not present

## 2016-04-06 DIAGNOSIS — R2689 Other abnormalities of gait and mobility: Secondary | ICD-10-CM | POA: Diagnosis not present

## 2016-04-06 DIAGNOSIS — E1122 Type 2 diabetes mellitus with diabetic chronic kidney disease: Secondary | ICD-10-CM | POA: Diagnosis not present

## 2016-04-07 ENCOUNTER — Ambulatory Visit (INDEPENDENT_AMBULATORY_CARE_PROVIDER_SITE_OTHER): Payer: Medicare Other | Admitting: Physician Assistant

## 2016-04-07 VITALS — BP 142/58 | HR 70 | Ht 71.0 in | Wt 138.0 lb

## 2016-04-07 DIAGNOSIS — I739 Peripheral vascular disease, unspecified: Secondary | ICD-10-CM | POA: Diagnosis not present

## 2016-04-07 DIAGNOSIS — I5022 Chronic systolic (congestive) heart failure: Secondary | ICD-10-CM | POA: Diagnosis not present

## 2016-04-07 DIAGNOSIS — I420 Dilated cardiomyopathy: Secondary | ICD-10-CM

## 2016-04-07 DIAGNOSIS — R001 Bradycardia, unspecified: Secondary | ICD-10-CM

## 2016-04-07 DIAGNOSIS — I2510 Atherosclerotic heart disease of native coronary artery without angina pectoris: Secondary | ICD-10-CM

## 2016-04-07 NOTE — Patient Instructions (Signed)
Medication Instructions:   Your physician recommends that you continue on your current medications as directed. Please refer to the Current Medication list given to you today.  If you need a refill on your cardiac medications before your next appointment, please call your pharmacy.  Labwork:  NONE ORDERED  TODAY    Testing/Procedures:  NONE ORDERED  TODAY   Follow-Up:  IN 3 MONTHS WITH DR Lovena Le    Any Other Special Instructions Will Be Listed Below (If Applicable).

## 2016-04-07 NOTE — Progress Notes (Signed)
Cardiology Office Note Date:  04/07/2016  Patient ID:  Colin [name], DOB January 14, 1929, MRN [id number] PCP:  Horatio Pel, MD  Cardiologist:  Dr. Acie Fredrickson Electrophysiologist: Dr. Lovena Le   Chief Complaint: f/u on bradycardia  History of Present Illness: Colin [name] is a 83 y.o. male with extensive history of CAD w/CABG remotely, chronic CHF, ICM, PVD  Of carotids, hx of left Fem-pop in 2015, amp toes on left, HTN, HLD, CRI, and SSSx w/PPM 2007 by Dr. Glade Lloyd, developed endocarditis in 2015 and required PPM extraction did not require reimplantation.  More recently he was seen by Dr. Acie Fredrickson with c/o weakness and found w/SB with HR 30's and his BB was stopped, he was seen in f/u November by Kathleen Argue with rates in 40's with ongoing weakness, occassonal dizziness, DOE, no reports of syncope and in d/w Dr. Curt Bears at the time, felt best to have f/u with Dr. Lovena Le for decision on possible pacing needs though I do not see that he has seen Dr. Lovena Le since that time.  Most recently he had an ER visit in February with hematuria, pelvic pain, has hx of prostate cancer previously treated,  In w/u for this he was found with marked retroperitoneal lymphadenopathy and a lytic lesion on T12, he has had out pt f/u with urology given his hx of prostate cancer, and w/u is in progress, he has been started on casodex with high PSA, bone scan was negative for mets, his daughter reports the working diagnosis is prostate cancer.   The patient comes today accompanied by his daughter, he is in a wheelchair though ambulates at home with the aid of a cane, they state when he needs to walk a longer way he needs the wheelchair.  His daughter reports he has had some functional decline, weight loss and not eating much, but gets around the house, goes outside to the garage to fuss around some and he is able to do these activities. The patient tells me he doesn't really care to do much more so feels like he can do what  he wants to without too much difficulty, though will get SOB with increased duration of walking or strenuous activities past casual walking.   He reports dizziness with a sensation of motion/not quite spinning that is infrequent, these do not make hm feel like his is going to faint.  He has an unexplained brief fainting spell over a year ago that he did not seek attention for, since then denies any lightheadedness, no near syncope or syncope.    Device information: SJM dual chamber PPM, implanted 2007 >> system extracted secondary to endocarditis, no re-implantation  Past Medical History:  Diagnosis Date  . Anemia 10/29/2013  . Anginal pain ([place])   . Arthritis   . Cardiomyopathy   . CHF (congestive heart failure) ([place]) 2007   ef 38%  . Chronic renal insufficiency    creatinine around 2.4  . COPD (chronic obstructive pulmonary disease) ([place])   . Coronary artery disease    Status post CABG in 2004  . Dementia   . Diabetes mellitus with peripheral artery disease ([place])   . Dyslipidemia   . GERD (gastroesophageal reflux disease)   . History of anemia   . History of CVA (cerebrovascular accident)   . Hypertension   . Myocardial infarction   . Pacemaker   . Peripheral vascular disease ([place])    Status post right femoropopliteal bypass  . Pneumonia   . Shortness of breath  Past Surgical History:  Procedure Laterality Date  . ABDOMINAL ANGIOGRAM  04/30/2013   Procedure: ABDOMINAL ANGIOGRAM;  Surgeon: Mal Misty, MD;  [facility];  Service: Cardiovascular;;  . AMPUTATION Left 05/05/2013   Procedure: AMPUTATION RAY- FIRST AND SECOND-LEFT FOOT;  Surgeon: Newt Minion, MD;  Location: [place];  Service: Orthopedics;  Laterality: Left;  . CARDIAC CATHETERIZATION  04/29/2002   Dr. Glade Lloyd  . CORONARY ANGIOPLASTY    . CORONARY ARTERY BYPASS GRAFT  4 of 2004   4 vessel bypass  . ESOPHAGOGASTRODUODENOSCOPY  07/21/2011   Procedure: ESOPHAGOGASTRODUODENOSCOPY (EGD);  Surgeon:  Beryle Beams, MD;  Location: [facility];  Service: Endoscopy;  Laterality: N/A;  . FEMORAL BYPASS  july 2006   right with right great toe amputation  . FEMORAL-POPLITEAL BYPASS GRAFT Left 05/01/2013   Procedure: LEFT FEMORAL TO ABOVE KNEE POPLITEAL ARTERY BYPASS GRAFT WITH INTRAOPERATIVE ARTEROGRAM;  Surgeon: Mal Misty, MD;  Location: [place];  Service: Vascular;  Laterality: Left;  . INSERT / REPLACE / REMOVE PACEMAKER  05/17/2005   st. jude dual chamber ddd mode   . LOWER EXTREMITY ANGIOGRAM N/A 04/30/2013   Procedure: LOWER EXTREMITY ANGIOGRAM;  Surgeon: Mal Misty, MD;  Location[facility] CATH LAB;  Service: Cardiovascular;  Laterality: N/A;  . PACEMAKER LEAD REMOVAL Right 10/27/2013   Procedure: PACEMAKER LEAD REMOVAL;  Surgeon: Evans Lance, MD;  Location: [place];  Service: Cardiovascular;  Laterality: Right;  Roxy Manns backing up  . TEE WITHOUT CARDIOVERSION N/A 10/23/2013   Procedure: TRANSESOPHAGEAL ECHOCARDIOGRAM (TEE);  Surgeon: Dorothy Spark, MD;  Location: [place];  Service: Cardiovascular;  Laterality: N/A;  . TEE WITHOUT CARDIOVERSION N/A 10/28/2013   Procedure: TRANSESOPHAGEAL ECHOCARDIOGRAM (TEE);  Surgeon: Dorothy Spark, MD;  Location[facility] ENDOSCOPY;  Service: Cardiovascular;  Laterality: N/A;    Current Outpatient Prescriptions  Medication Sig Dispense Refill  . acetaminophen (TYLENOL) 325 MG tablet Take 2 tablets (650 mg total) by mouth every 6 (six) hours as needed for mild pain or fever.    Marland Kitchen aspirin EC 81 MG tablet Take 81 mg by mouth daily.    . B Complex-C (B-COMPLEX WITH VITAMIN C) tablet Take 1 tablet by mouth daily.    Marland Kitchen docusate sodium 100 MG CAPS Take 100 mg by mouth 2 (two) times daily. 10 capsule 0  . ezetimibe (ZETIA) 10 MG tablet Take 1 tablet (10 mg total) by mouth daily. 90 tablet 3  . ferrous sulfate 325 (65 FE) MG tablet Take 1 tablet (325 mg total) by mouth daily with breakfast.  3  . furosemide (LASIX) 40 MG tablet Take 1 tablet (40 mg total) by  mouth daily. 90 tablet 3  . hydrALAZINE (APRESOLINE) 10 MG tablet Take 1 tablet (10 mg total) by mouth every 8 (eight) hours. 270 tablet 3  . insulin aspart (NOVOLOG) 100 UNIT/ML injection Inject 3 Units into the skin 3 (three) times daily before meals. Reported on 02/20/2015    . insulin glargine (LANTUS) 100 UNIT/ML injection Inject 0.1 mLs (10 Units total) into the skin at bedtime. 10 mL 11  . isosorbide mononitrate (IMDUR) 30 MG 24 hr tablet Take 1 tablet (30 mg total) by mouth daily. 90 tablet 3  . Omega-3 Fatty Acids (FISH OIL) 1000 MG CAPS Take 1,000 mg by mouth daily.     . ondansetron (ZOFRAN) 4 MG tablet Take 1 tablet (4 mg total) by mouth every 6 (six) hours as needed for nausea. 20 tablet 0  .  vitamin B-12 (CYANOCOBALAMIN) 1000 MCG tablet Take 1,000 mcg by mouth daily.     No current facility-administered medications for this visit.     Allergies:   Lisinopril   Social History:  The patient  reports that he quit smoking about 13 years ago. His smoking use included Cigarettes. He has a 50.00 pack-year smoking history. He has never used smokeless tobacco. He reports that he does not drink alcohol or use drugs.   Family History:  The patient's family history includes Diabetes in his sister; Heart disease in his father; Hyperlipidemia in his mother and sister; Hypertension in his sister.  ROS:  Please see the history of present illness.    All other systems are reviewed and otherwise negative.   PHYSICAL EXAM:  VS:  BP (!) 142/58   Pulse 70   Ht 5\' 11"  (1.803 m)   Wt 138 lb (62.6 kg)   BMI 19.25 kg/m  BMI: Body mass index is 19.25 kg/m. Very plesant male, thin with significant muscle atrophy, appears in no acute distress  HEENT: normocephalic, atraumatic  Neck: no JVD, carotid bruits or masses Cardiac:  RRR; no significant murmurs, no rubs, or gallops Lungs:  CTA b/l, no wheezing, rhonchi or rales  Abd: soft, nontender MS: no deformity, marked muscle atrophy Ext:  no edema    Skin: warm and dry, no rash Neuro:  No gross deficits appreciated Psych: euthymic mood, full affect   EKG:  Done today and reviewed by myself shows SR, PAC, 20bpm,  PR 259ms, QRS 172ms, QTc 456ms 10/28/13: TTE Study Conclusions - Left ventricle: Systolic function was moderately to severely reduced. The estimated ejection fraction was in the range of 30% to 35%. Wall motion was normal; there were no regional wall motion abnormalities. - Aortic valve: No evidence of vegetation. - Left atrium: The atrium was dilated. No evidence of thrombus in the atrial cavity or appendage. No evidence of thrombus in the atrial cavity or appendage. - Right atrium: No evidence of thrombus in the atrial cavity or appendage. Impressions - There is a retained portion of the RV lead located at the RV apex with no remnant insulation or vegetation present. The lead measures 3 cm. There is no vegetation seen on the tricuspid or pulmonic valve. A central catheter is present in the right atrium.   Recent Labs: 12/07/2015: TSH 3.44 02/29/2016: ALT 12; BUN 75; Creatinine, Ser 2.23; Hemoglobin 9.3; Platelets 229; Potassium 5.4; Sodium 136  No results found for requested labs within last 8760 hours.   CrCl cannot be calculated (Patient's most recent lab result is older than the maximum 21 days allowed.).   Wt Readings from Last 3 Encounters:  04/07/16 138 lb (62.6 kg)  12/07/15 147 lb 6.4 oz (66.9 kg)  11/17/15 150 lb 6.4 oz (68.2 kg)     Other studies reviewed: Additional studies/records reviewed today include: summarized above  ASSESSMENT AND PLAN:   1. Bradycardia, hx of PPM extraction 2015 2/2 endocarditis     Retained piece of RV lead, full course of abx completed     Recurrent bradycardia noted October/November, SR 70's today      He seems to have had a significant overall functional decline including a new working diagnoses of prostate cancer w/retroperitoneal adenopathy,  unintentional weight loss and poor PO intake, beyond what I think could be attributed to bradycardia  He had a momentary episode of syncope over a year ago, none since.  Recommend ongoing watchful waiting, reviewed with Dr.  Nahser, was in agreement.  2. DCM, chronic CHF (systolic)    Off BB with bradycardia    Will defer to Dr. Acie Fredrickson  3. CAD     no anginal complaints      c/w Dr. Acie Fredrickson  4. HTN     No changes today  5. CRI (stage III-IV)  6. PVD     Follows with vascular, Dr. Donnetta Hutching  7. Recently dx with recurrent prostate cancer      Retroperitoneal lymphadenopathy incidentally found 2/2 c/o hematuria and pelvic pain     Getting Casodex lupron injection    Disposition: We discussed if her develops episodic weakness, near fainting or fainting to seek attention, otherwise will plan f/u with Dr. Lovena Le in 3 months, sooner if needed.  Current medicines are reviewed at length with the patient today.  The patient did not have any concerns regarding medicines.  Haywood Lasso, PA-C 04/07/2016 5:26 PM     [address] [place] [address] [phone] (office)  [phone] (fax)

## 2016-05-10 ENCOUNTER — Ambulatory Visit: Payer: Medicare Other | Admitting: Internal Medicine

## 2016-06-07 DIAGNOSIS — I259 Chronic ischemic heart disease, unspecified: Secondary | ICD-10-CM | POA: Diagnosis not present

## 2016-06-07 DIAGNOSIS — N189 Chronic kidney disease, unspecified: Secondary | ICD-10-CM | POA: Diagnosis not present

## 2016-06-07 DIAGNOSIS — C61 Malignant neoplasm of prostate: Secondary | ICD-10-CM | POA: Diagnosis not present

## 2016-06-07 DIAGNOSIS — E1159 Type 2 diabetes mellitus with other circulatory complications: Secondary | ICD-10-CM | POA: Diagnosis not present

## 2016-07-03 ENCOUNTER — Encounter: Payer: Self-pay | Admitting: Oncology

## 2016-07-03 DIAGNOSIS — N184 Chronic kidney disease, stage 4 (severe): Secondary | ICD-10-CM | POA: Diagnosis not present

## 2016-07-03 DIAGNOSIS — R972 Elevated prostate specific antigen [PSA]: Secondary | ICD-10-CM | POA: Diagnosis not present

## 2016-07-03 DIAGNOSIS — C61 Malignant neoplasm of prostate: Secondary | ICD-10-CM | POA: Diagnosis not present

## 2016-07-03 DIAGNOSIS — R590 Localized enlarged lymph nodes: Secondary | ICD-10-CM | POA: Diagnosis not present

## 2016-07-20 ENCOUNTER — Encounter: Payer: Self-pay | Admitting: Internal Medicine

## 2016-07-20 ENCOUNTER — Ambulatory Visit (INDEPENDENT_AMBULATORY_CARE_PROVIDER_SITE_OTHER): Payer: Medicare Other | Admitting: Internal Medicine

## 2016-07-20 ENCOUNTER — Encounter (INDEPENDENT_AMBULATORY_CARE_PROVIDER_SITE_OTHER): Payer: Self-pay

## 2016-07-20 ENCOUNTER — Ambulatory Visit (HOSPITAL_BASED_OUTPATIENT_CLINIC_OR_DEPARTMENT_OTHER): Payer: Medicare Other | Admitting: Oncology

## 2016-07-20 VITALS — BP 144/50 | HR 80 | Ht 69.0 in | Wt 149.0 lb

## 2016-07-20 VITALS — BP 152/56 | HR 71 | Temp 98.9°F | Resp 17 | Ht 69.0 in | Wt 149.3 lb

## 2016-07-20 DIAGNOSIS — C61 Malignant neoplasm of prostate: Secondary | ICD-10-CM

## 2016-07-20 DIAGNOSIS — C778 Secondary and unspecified malignant neoplasm of lymph nodes of multiple regions: Secondary | ICD-10-CM | POA: Diagnosis not present

## 2016-07-20 DIAGNOSIS — R55 Syncope and collapse: Secondary | ICD-10-CM

## 2016-07-20 DIAGNOSIS — I5022 Chronic systolic (congestive) heart failure: Secondary | ICD-10-CM

## 2016-07-20 DIAGNOSIS — C775 Secondary and unspecified malignant neoplasm of intrapelvic lymph nodes: Principal | ICD-10-CM

## 2016-07-20 NOTE — Patient Instructions (Addendum)
Medication Instructions:  Your physician recommends that you continue on your current medications as directed. Please refer to the Current Medication list given to you today.   Labwork: None Ordered   Testing/Procedures: Your physician has recommended that you wear an 3 week event monitor. Event monitors are medical devices that record the heart's electrical activity. Doctors most often [place] these monitors to diagnose arrhythmias. Arrhythmias are problems with the speed or rhythm of the heartbeat. The monitor is a small, portable device. You can wear one while you do your normal daily activities. This is usually used to diagnose what is causing palpitations/syncope (passing out).     Follow-Up: Follow-up to be determined   Any Other Special Instructions Will Be Listed Below (If Applicable).     If you need a refill on your cardiac medications before your next appointment, please call your pharmacy.

## 2016-07-20 NOTE — Progress Notes (Signed)
HPI Colin [name] returns today for followup. 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  He denies fever or chills. He was not pacemaker dependent, and did not have a temporary pacing lead placed. He notes occaisional dizzy spells but no frank syncope. He has developed metastatic prostate CA and has had improvement with hormone therapy. He has had dizzy spells. No documented symptomatic bradycardia.  Allergies  Allergen Reactions  . Lisinopril Other (See Comments)    REACTION:  Elevated potassium,renal insuf     Current Outpatient Prescriptions  Medication Sig Dispense Refill  . acetaminophen (TYLENOL) 325 MG tablet Take 2 tablets (650 mg total) by mouth every 6 (six) hours as needed for mild pain or fever.    Marland Kitchen aspirin EC 81 MG tablet Take 81 mg by mouth daily.    . B Complex-C (B-COMPLEX WITH VITAMIN C) tablet Take 1 tablet by mouth daily.    Marland Kitchen docusate sodium 100 MG CAPS Take 100 mg by mouth 2 (two) times daily. 10 capsule 0  . ezetimibe (ZETIA) 10 MG tablet Take 1 tablet (10 mg total) by mouth daily. 90 tablet 3  . ferrous sulfate 325 (65 FE) MG tablet Take 1 tablet (325 mg total) by mouth daily with breakfast.  3  . furosemide (LASIX) 40 MG tablet Take 1 tablet (40 mg total) by mouth daily. 90 tablet 3  . hydrALAZINE (APRESOLINE) 10 MG tablet Take 1 tablet (10 mg total) by mouth every 8 (eight) hours. 270 tablet 3  . isosorbide mononitrate (IMDUR) 30 MG 24 hr tablet Take 1 tablet (30 mg total) by mouth daily. 90 tablet 3  . Omega-3 Fatty Acids (FISH OIL) 1000 MG CAPS Take 1,000 mg by mouth daily.     . ondansetron (ZOFRAN) 4 MG tablet Take 1 tablet (4 mg total) by mouth every 6 (six) hours as needed for nausea. 20 tablet 0  . vitamin B-12 (CYANOCOBALAMIN) 1000 MCG tablet Take 1,000 mcg by mouth daily.     No current  facility-administered medications for this visit.      Past Medical History:  Diagnosis Date  . Anemia 10/29/2013  . Anginal pain ([place])   . Arthritis   . Cardiomyopathy   . CHF (congestive heart failure) ([place]) 2007   ef 38%  . Chronic renal insufficiency    creatinine around 2.4  . COPD (chronic obstructive pulmonary disease) ([place])   . Coronary artery disease    Status post CABG in 2004  . Dementia   . Diabetes mellitus with peripheral artery disease ([place])   . Dyslipidemia   . GERD (gastroesophageal reflux disease)   . History of anemia   . History of CVA (cerebrovascular accident)   . Hypertension   . Myocardial infarction ([place])   . Pacemaker   . Peripheral vascular disease ([place])    Status post right femoropopliteal bypass  . Pneumonia   . Shortness of breath     ROS:   All systems reviewed and negative except as noted in the HPI.   Past Surgical History:  Procedure Laterality Date  . ABDOMINAL ANGIOGRAM  04/30/2013   Procedure: ABDOMINAL ANGIOGRAM;  Surgeon: Mal Misty, MD;  Location: [facility] CATH LAB;  Service: Cardiovascular;;  . AMPUTATION Left 05/05/2013   Procedure: AMPUTATION RAY- FIRST AND SECOND-LEFT FOOT;  Surgeon:  Newt Minion, MD;  Location: [place];  Service: Orthopedics;  Laterality: Left;  . CARDIAC CATHETERIZATION  04/29/2002   Dr. Glade Lloyd  . CORONARY ANGIOPLASTY    . CORONARY ARTERY BYPASS GRAFT  4 of 2004   4 vessel bypass  . ESOPHAGOGASTRODUODENOSCOPY  07/21/2011   Procedure: ESOPHAGOGASTRODUODENOSCOPY (EGD);  Surgeon: Beryle Beams, MD;  Location: [facility];  Service: Endoscopy;  Laterality: N/A;  . FEMORAL BYPASS  july 2006   right with right great toe amputation  . FEMORAL-POPLITEAL BYPASS GRAFT Left 05/01/2013   Procedure: LEFT FEMORAL TO ABOVE KNEE POPLITEAL ARTERY BYPASS GRAFT WITH INTRAOPERATIVE ARTEROGRAM;  Surgeon: Mal Misty, MD;  Location: [place];  Service: Vascular;  Laterality: Left;  . INSERT / REPLACE / REMOVE PACEMAKER  05/17/2005    st. jude dual chamber ddd mode   . LOWER EXTREMITY ANGIOGRAM N/A 04/30/2013   Procedure: LOWER EXTREMITY ANGIOGRAM;  Surgeon: Mal Misty, MD;  Location: [facility] CATH LAB;  Service: Cardiovascular;  Laterality: N/A;  . PACEMAKER LEAD REMOVAL Right 10/27/2013   Procedure: PACEMAKER LEAD REMOVAL;  Surgeon: Evans Lance, MD;  Location: [place];  Service: Cardiovascular;  Laterality: Right;  Roxy Manns backing up  . TEE WITHOUT CARDIOVERSION N/A 10/23/2013   Procedure: TRANSESOPHAGEAL ECHOCARDIOGRAM (TEE);  Surgeon: Dorothy Spark, MD;  Location: [place];  Service: Cardiovascular;  Laterality: N/A;  . TEE WITHOUT CARDIOVERSION N/A 10/28/2013   Procedure: TRANSESOPHAGEAL ECHOCARDIOGRAM (TEE);  Surgeon: Dorothy Spark, MD;  Location: [facility] ENDOSCOPY;  Service: Cardiovascular;  Laterality: N/A;     Family History  Problem Relation Age of Onset  . Hyperlipidemia Mother   . Heart disease Father   . Diabetes Sister   . Hypertension Sister   . Hyperlipidemia Sister   . Diabetes Unknown      Social History   Social History  . Marital status: Married    Spouse name: N/A  . Number of children: N/A  . Years of education: N/A   Occupational History  . Not on file.   Social History Main Topics  . Smoking status: Former Smoker    Packs/day: 1.00    Years: 50.00    Types: Cigarettes    Quit date: 06/09/2002  . Smokeless tobacco: Never Used  . Alcohol use No  . Drug use: No  . Sexual activity: Not Currently   Other Topics Concern  . Not on file   Social History Narrative   Lives in a house.  Lives with his wive.  Drives.  Uses a cane for ambulation.       BP (!) 144/50   Pulse 80   Ht 5\' 9"  (1.753 m)   Wt 149 lb (67.6 kg)   SpO2 98%   BMI 22.00 kg/m   Physical Exam:  Stable but eldelry appearing 83 yo man, NAD HEENT: Unremarkable Neck:  6 cm JVD, no thyromegally Back:  No CVA tenderness Lungs:  Clear with well healed incision in the right chest HEART:  Regular rate  rhythm, no murmurs, no rubs, no clicks Abd:  soft, positive bowel sounds, no organomegally, no rebound, no guarding Ext:  2 plus pulses, no edema, no cyanosis, no clubbing Skin:  No rashes no nodules Neuro:  CN II through XII intact, motor grossly intact    Assess/Plan: 1. Sinus bradycardia - I am not convinced that he is symptomatic. At this point, I would recommend holding off of PPM insertion.  2. HTN - his blood pressure is a  bit high but asymptomatic. Will follow.  3. Syncope - I have discussed insertion of an ILR vs having him wear a cardiac monitor and will have him wear a heart monitor.  Mikle Bosworth.D.

## 2016-07-20 NOTE — Progress Notes (Signed)
Colin [name] Consult   Referring MD:  Benedetto Ryder Heindel 83 y.o.  Mar 23, 1928    Reason for Referral: Prostate cancer   HPI: Colin [name] presents today with a neighbor. He is a poor historian. He is not sure when he was diagnosed with prostate cancer. He was seen in the emergency room 02/29/2016 with hematuria. On CT he was found to have marked retroperitoneal, abdominal, and pelvic lymphadenopathy. The prostate was heterogenous the enlarged. The bladder had wall thickening. There was an indeterminate lytic lesion in T12. A bone scan 03/14/2016 revealed no evidence of metastatic disease.  He was referred to Dr. Lawerance Bach. A PSA on 03/06/2016 returned at 1590. He was started on case index and Lupron. He saw Dr. Rosana Hoes for a follow-up visit 07/03/2016. The PSA returned at 1.96. He is scheduled for an office visit and the next Lupron injection in September.   He reports anorexia, but this has improved.   Past Medical History:  Diagnosis Date  . Anemia 10/29/2013  . Anginal pain ([place])   . Arthritis   . Cardiomyopathy   . CHF (congestive heart failure) ([place]) 2007   ef 38%  . Chronic renal insufficiency    creatinine around 2.4  . COPD (chronic obstructive pulmonary disease) ([place])   . Coronary artery disease    Status post CABG in 2004  . Dementia   . Diabetes mellitus with peripheral artery disease ([place])   . Dyslipidemia   . GERD (gastroesophageal reflux disease)   .    Marland Kitchen History of CVA (cerebrovascular accident)   . Hypertension   . Myocardial infarction ([place])   . Pacemaker   . Peripheral vascular disease ([place])    Status post right femoropopliteal bypass  . Pneumonia   . Prostate cancer      Past Surgical History:  Procedure Laterality Date  . ABDOMINAL ANGIOGRAM  04/30/2013   Procedure: ABDOMINAL ANGIOGRAM;  Surgeon: Mal Misty, MD;  Location: [facility] CATH LAB;  Service: Cardiovascular;;  . AMPUTATION Left 05/05/2013   Procedure: AMPUTATION RAY-  FIRST AND SECOND-LEFT FOOT;  Surgeon: Newt Minion, MD;  Location: [place];  Service: Orthopedics;  Laterality: Left;  . CARDIAC CATHETERIZATION  04/29/2002   Dr. Glade Lloyd  . CORONARY ANGIOPLASTY    . CORONARY ARTERY BYPASS GRAFT  4 of 2004   4 vessel bypass  . ESOPHAGOGASTRODUODENOSCOPY  07/21/2011   Procedure: ESOPHAGOGASTRODUODENOSCOPY (EGD);  Surgeon: Beryle Beams, MD;  Location: [facility];  Service: Endoscopy;  Laterality: N/A;  . FEMORAL BYPASS  july 2006   right with right great toe amputation  . FEMORAL-POPLITEAL BYPASS GRAFT Left 05/01/2013   Procedure: LEFT FEMORAL TO ABOVE KNEE POPLITEAL ARTERY BYPASS GRAFT WITH INTRAOPERATIVE ARTEROGRAM;  Surgeon: Mal Misty, MD;  Location: [place];  Service: Vascular;  Laterality: Left;  . INSERT / REPLACE / REMOVE PACEMAKER  05/17/2005   st. jude dual chamber ddd mode   . LOWER EXTREMITY ANGIOGRAM N/A 04/30/2013   Procedure: LOWER EXTREMITY ANGIOGRAM;  Surgeon: Mal Misty, MD;  Location: [facility] CATH LAB;  Service: Cardiovascular;  Laterality: N/A;  . PACEMAKER LEAD REMOVAL Right 10/27/2013   Procedure: PACEMAKER LEAD REMOVAL;  Surgeon: Evans Lance, MD;  Location: [place];  Service: Cardiovascular;  Laterality: Right;  Roxy Manns backing up  . TEE WITHOUT CARDIOVERSION N/A 10/23/2013   Procedure: TRANSESOPHAGEAL ECHOCARDIOGRAM (TEE);  Surgeon: Dorothy Spark, MD;  Location: [place];  Service: Cardiovascular;  Laterality: N/A;  .  TEE WITHOUT CARDIOVERSION N/A 10/28/2013   Procedure: TRANSESOPHAGEAL ECHOCARDIOGRAM (TEE);  Surgeon: Dorothy Spark, MD;  Location: [facility] ENDOSCOPY;  Service: Cardiovascular;  Laterality: N/A;    Medications: Reviewed  Allergies:  Allergies  Allergen Reactions  . Lisinopril Other (See Comments)    REACTION:  Elevated potassium,renal insuf    Family history: He is not aware of a family history of cancer. He has 7 children.  Social History:   He lives with his wife in [place]. He worked in a Armed forces logistics/support/administrative officer. He  quit smoking cigarettes 5 years ago. No alcohol use. No risk factor for HIV or hepatitis.  ROS:   Positives include: Anorexia, intermittent solid dysphagia, knee pain, occasional chest pain, constipation, decreased urinary stream, nocturia  A complete ROS was otherwise negative.  Physical Exam:  Blood pressure (!) 152/56, pulse 71, temperature 98.9 F (37.2 C), temperature source Oral, resp. rate 17, height 5\' 9"  (1.753 m), weight 149 lb 4.8 oz (67.7 kg), SpO2 100 %.  HEENT: Edentulous, oropharynx without visible mass, neck without mass Lungs: Clear bilaterally, no respiratory distress Cardiac: Regular rate and rhythm Abdomen: No hepatosplenomegaly, no mass, nontender GU: Uncircumcised male, testes without mass  Vascular: No leg edema Lymph nodes: No cervical, supra-clavicular, axillary, or inguinal nodes Neurologic: Alert, follows commands, the motor exam is intact in the upper and lower extremities Skin: No rash Musculoskeletal: No spine tenderness   LAB:  CBC  Lab Results  Component Value Date   WBC 5.2 02/29/2016   HGB 9.3 (L) 02/29/2016   HCT 29.9 (L) 02/29/2016   MCV 87.4 02/29/2016   PLT 229 02/29/2016   NEUTROABS 3.4 10/26/2013        CMP     Component Value Date/Time   NA 136 02/29/2016 1700   K 5.4 (H) 02/29/2016 1700   CL 109 02/29/2016 1700   CO2 21 (L) 02/29/2016 1700   GLUCOSE 117 (H) 02/29/2016 1700   BUN 75 (H) 02/29/2016 1700   CREATININE 2.23 (H) 02/29/2016 1700   CALCIUM 9.6 02/29/2016 1700   PROT 6.6 02/29/2016 1700   ALBUMIN 3.0 (L) 02/29/2016 1700   AST 24 02/29/2016 1700   ALT 12 (L) 02/29/2016 1700   ALKPHOS 52 02/29/2016 1700   BILITOT 0.5 02/29/2016 1700   GFRNONAA 25 (L) 02/29/2016 1700   GFRAA 29 (L) 02/29/2016 1700     Imaging:  CT images from 02/29/2016-reviewed   Assessment/Plan:   1. Prostate cancer-metastatic, hormone sensitive  Initiation of Lupron/case and expiratory 2018, now maintained on every three-month  Lupron with Dr. Rosana Hoes  CT abdomen/pelvis 02/29/2016-enlarged prostate, extensive abdominal/retroperitoneal/pelvic adenopathy  Bone scan 03/14/2016-negative for metastatic disease 2.   History of coronary artery disease 3.   Diabetes 4.   Peripheral vascular disease 5.   Anemia 6.   Chronic renal failure 7.   COPD 8.   Dementia  Disposition:   Colin [name] has metastatic prostate cancer. He has hormone sensitive disease. The PSA has declined markedly since starting Lupron therapy earlier this year. He will continue Lupron under the direction of Dr. Rosana Hoes.  He will return for an office visit here in approximately 4 months. We will initiate salvage systemic therapy if the tumor becomes hormone refractory.  It is unclear whether he has symptoms related to prostate cancer at present. He has gained weight over the past several months and he does not complain of significant pain.  50 minutes were spent with the [name] today. The majority of the time was  used for counseling and coordination of care.  Donneta Romberg, MD  07/20/2016, 2:23 PM

## 2016-07-24 ENCOUNTER — Telehealth: Payer: Self-pay | Admitting: Oncology

## 2016-07-24 NOTE — Telephone Encounter (Signed)
Patient bypassed scheduling on 07/20/16. Called patient to confirm follow up appointment, No voicemail available. Follow up appointment schedule per 07/20/16 los.  A copy of the appointment schedule and letter was mailed to patient, per 07/20/16 los.

## 2016-08-01 ENCOUNTER — Ambulatory Visit (INDEPENDENT_AMBULATORY_CARE_PROVIDER_SITE_OTHER): Payer: Medicare Other

## 2016-08-01 ENCOUNTER — Telehealth: Payer: Self-pay | Admitting: Internal Medicine

## 2016-08-01 DIAGNOSIS — I10 Essential (primary) hypertension: Secondary | ICD-10-CM | POA: Diagnosis not present

## 2016-08-01 DIAGNOSIS — R55 Syncope and collapse: Secondary | ICD-10-CM

## 2016-08-01 DIAGNOSIS — D649 Anemia, unspecified: Secondary | ICD-10-CM | POA: Diagnosis not present

## 2016-08-01 DIAGNOSIS — I509 Heart failure, unspecified: Secondary | ICD-10-CM | POA: Diagnosis not present

## 2016-08-01 DIAGNOSIS — E1121 Type 2 diabetes mellitus with diabetic nephropathy: Secondary | ICD-10-CM | POA: Diagnosis not present

## 2016-08-01 DIAGNOSIS — R0789 Other chest pain: Secondary | ICD-10-CM | POA: Diagnosis not present

## 2016-08-01 NOTE — Telephone Encounter (Signed)
Follow up    Gay Filler from [facility] is calling back to RN.

## 2016-08-01 NOTE — Telephone Encounter (Signed)
Called, spoke with pt's daughter, Luanna Salk (on [place]). Luanna Salk stated pt was seen today by PCP and pt reported chest pressure. Dr. Shelia Media stated pt should be seen by our office. Luanna Salk stated pt denies CP or SOB. Informed I will contact PCP to ger referral information. Someone will contact pt from our office to schedule.

## 2016-08-01 NOTE — Telephone Encounter (Signed)
Luanna Salk (pt daughter) calling, states that her father was seen by PCP today and was told that he has chest pressure. Valeria wanted to make office aware.

## 2016-08-01 NOTE — Telephone Encounter (Signed)
Spoke with Gay Filler at Dr. Pennie Banter office. She stated just wanted pt to call us to let the doctor know pt was having some chest tightness. Gay Filler did not indicate for [place] to make appointment, at this time. Pt scheduled today for event monitor. Informed will forward to Dr. Acie Fredrickson and Dr. Lovena Le.

## 2016-08-01 NOTE — Telephone Encounter (Signed)
Called Dr. Pennie Banter office, left msg for [place]. Informed to please forward notes form today's visit. Please let us know how soon pt needs to be scheduled.

## 2016-08-02 ENCOUNTER — Encounter: Payer: Self-pay | Admitting: Physician Assistant

## 2016-08-02 NOTE — Telephone Encounter (Signed)
It sound like he needs to see a NP or PA to evaluate this chest tightness I do not have an appt for quite a while. Do we have an available spot

## 2016-08-02 NOTE — Progress Notes (Signed)
Cardiology Office Note    Date:  08/03/2016  ID:  Colin [name], DOB 1928/07/10, MRN [id number] PCP:  Deland Pretty, MD  Cardiologist:  Dr. Acie Fredrickson, EP - Dr. Lovena Le   Chief Complaint: chest pain  History of Present Illness:  Colin [name] is a 83 y.o. male with history of CAD s/p CABG 4431, chronic systolic CHF, ischemic cardiomyopathy, SSS s/p pacemaker 2007 by Dr. Glade Lloyd with extraction of device leads in 2015 after endocarditis/vegetation on pacemaker lead, carotid artery disease,  PAD (s/p L fem to above knee popliteal bypass in 4/15; s/p amp of L 1st and 2nd toes), HL, HTN, DM, CKD stage IV, dementia, metastatic prostate cancer, anemia, CVA who presents for evaluation of chest pain.  To recap history, he developed endocarditis in 2015 with vegetation on his pacemaker lead. His pacemaker leads were extracted. He did not undergo pacemaker reimplantation as he was not considered pacemaker dependent. He was seen by Dr. Acie Fredrickson 10/2015 with weakness and profound sinus bradycardia with a heart rate of 39. His beta blocker was stopped. He's had ongoing evaluation by EP given persistent bradycardia, but unclear how truly symptomatic he is. In the interim he was evaluated in February 2018 with hematuria, pelvic pain, has hx of prostate cancer previously treated, In w/u for this he was found with marked retroperitoneal lymphadenopathy and a lytic lesion on T12. He has had out pt f/u with urology and oncology with diagnosis of metastatic prostate cancer. He saw Dr. Lovena Le 07/20/16 - note indicates "occasional dizzy spells but no frank syncope," and in A/P, it states an event monitor would be placed for syncope. Last available labs 02/2016 showed K 5.4, Cr 2.23, albumin 3.0, Hgb 9.3. He also has a history of atypical-type chest pains.  He presents back to clinic today with his daughter. History taking is definitely limited by his memory. He states in general he has been doing pretty good. As much as he  can recall, he's been having intermittent episodes of chest discomfort for years to months. This is like a pressure that happens a few times a week lasting 2-3 seconds. This resolves spontaneously. It is not provoked by anything. Has not had to take NTG. He feels well today. Walked into clinic without any difficulties. He is not really able to give me any further details on the syncope vs dizziness - states he thinks in the past he's "gone out" for a second without falling. Had monitor placed yesterday, no episodes in the last few days. His daughter has been generally unaware of these complaints recently. She states her mom made the appointment but mom is currently in the hospital for her own issues. The patient denies any palpitations, LEE, orthopnea.    Past Medical History:  Diagnosis Date  . Anemia 10/29/2013  . Arthritis   . Bradycardia   . Cardiomyopathy   . Carotid artery disease ([place])    a. Carotid US 2/17: R 50-69, L plaque.  . Chronic systolic CHF (congestive heart failure) ([place])   . CKD (chronic kidney disease), stage IV ([place])   . COPD (chronic obstructive pulmonary disease) ([place])   . Coronary artery disease    Status post CABG in 2004  . Dementia   . Diabetes mellitus with peripheral artery disease ([place])   . Dyslipidemia   . GERD (gastroesophageal reflux disease)   . History of anemia   . History of CVA (cerebrovascular accident)   . Hypertension   . Myocardial infarction ([place])   .  Peripheral vascular disease ([place])    Status post right femoropopliteal bypass  . Prostate cancer ([place])    metastatic  . SSS (sick sinus syndrome) ([place])    a. s/p PPM 2007 with lead vegetation in 2015 s/p extraction of pacemaker leads.    Past Surgical History:  Procedure Laterality Date  . ABDOMINAL ANGIOGRAM  04/30/2013   Procedure: ABDOMINAL ANGIOGRAM;  Surgeon: Mal Misty, MD;  Location: [facility] CATH LAB;  Service: Cardiovascular;;  . AMPUTATION Left 05/05/2013   Procedure: AMPUTATION RAY-  FIRST AND SECOND-LEFT FOOT;  Surgeon: Newt Minion, MD;  Location: [place];  Service: Orthopedics;  Laterality: Left;  . CARDIAC CATHETERIZATION  04/29/2002   Dr. Glade Lloyd  . CORONARY ANGIOPLASTY    . CORONARY ARTERY BYPASS GRAFT  4 of 2004   4 vessel bypass  . ESOPHAGOGASTRODUODENOSCOPY  07/21/2011   Procedure: ESOPHAGOGASTRODUODENOSCOPY (EGD);  Surgeon: Beryle Beams, MD;  Location: [facility];  Service: Endoscopy;  Laterality: N/A;  . FEMORAL BYPASS  july 2006   right with right great toe amputation  . FEMORAL-POPLITEAL BYPASS GRAFT Left 05/01/2013   Procedure: LEFT FEMORAL TO ABOVE KNEE POPLITEAL ARTERY BYPASS GRAFT WITH INTRAOPERATIVE ARTEROGRAM;  Surgeon: Mal Misty, MD;  Location: [place];  Service: Vascular;  Laterality: Left;  . INSERT / REPLACE / REMOVE PACEMAKER  05/17/2005   st. jude dual chamber ddd mode   . LOWER EXTREMITY ANGIOGRAM N/A 04/30/2013   Procedure: LOWER EXTREMITY ANGIOGRAM;  Surgeon: Mal Misty, MD;  Location: [facility] CATH LAB;  Service: Cardiovascular;  Laterality: N/A;  . PACEMAKER LEAD REMOVAL Right 10/27/2013   Procedure: PACEMAKER LEAD REMOVAL;  Surgeon: Evans Lance, MD;  Location: [place];  Service: Cardiovascular;  Laterality: Right;  Roxy Manns backing up  . TEE WITHOUT CARDIOVERSION N/A 10/23/2013   Procedure: TRANSESOPHAGEAL ECHOCARDIOGRAM (TEE);  Surgeon: Dorothy Spark, MD;  Location: [place];  Service: Cardiovascular;  Laterality: N/A;  . TEE WITHOUT CARDIOVERSION N/A 10/28/2013   Procedure: TRANSESOPHAGEAL ECHOCARDIOGRAM (TEE);  Surgeon: Dorothy Spark, MD;  Location: [facility] ENDOSCOPY;  Service: Cardiovascular;  Laterality: N/A;    Current Medications: Current Meds  Medication Sig  . aspirin EC 81 MG tablet Take 81 mg by mouth daily.  Marland Kitchen ezetimibe (ZETIA) 10 MG tablet Take 1 tablet (10 mg total) by mouth daily.  . ferrous sulfate 325 (65 FE) MG tablet Take 1 tablet (325 mg total) by mouth daily with breakfast.  . furosemide (LASIX) 40 MG tablet Take 1  tablet (40 mg total) by mouth daily.  . hydrALAZINE (APRESOLINE) 10 MG tablet Take 1 tablet (10 mg total) by mouth every 8 (eight) hours.  . isosorbide mononitrate (IMDUR) 30 MG 24 hr tablet Take 1 tablet (30 mg total) by mouth daily.  . Omega-3 Fatty Acids (FISH OIL) 1000 MG CAPS Take 1,000 mg by mouth daily.   . vitamin B-12 (CYANOCOBALAMIN) 1000 MCG tablet Take 1,000 mcg by mouth daily.  . [DISCONTINUED] acetaminophen (TYLENOL) 325 MG tablet Take 2 tablets (650 mg total) by mouth every 6 (six) hours as needed for mild pain or fever.  . [DISCONTINUED] B Complex-C (B-COMPLEX WITH VITAMIN C) tablet Take 1 tablet by mouth daily.  . [DISCONTINUED] docusate sodium 100 MG CAPS Take 100 mg by mouth 2 (two) times daily.  . [DISCONTINUED] ondansetron (ZOFRAN) 4 MG tablet Take 1 tablet (4 mg total) by mouth every 6 (six) hours as needed for nausea.     Allergies:   Lisinopril  Social History   Social History  . Marital status: Married    Spouse name: N/A  . Number of children: N/A  . Years of education: N/A   Social History Main Topics  . Smoking status: Former Smoker    Packs/day: 1.00    Years: 50.00    Types: Cigarettes    Quit date: 06/09/2002  . Smokeless tobacco: Never Used  . Alcohol use No  . Drug use: No  . Sexual activity: Not Currently   Other Topics Concern  . None   Social History Narrative   Lives in a house.  Lives with his wive.  Drives.  Uses a cane for ambulation.       Family History:  Family History  Problem Relation Age of Onset  . Hyperlipidemia Mother   . Heart disease Father   . Diabetes Sister   . Hypertension Sister   . Hyperlipidemia Sister   . Diabetes Unknown     ROS:   Please see the history of present illness. Poor appetite in general. All other systems are reviewed and otherwise negative.    PHYSICAL EXAM:   VS:  BP (!) 150/56   Pulse (!) 53   Ht 5\' 6"  (1.676 m)   Wt 153 lb (69.4 kg)   BMI 24.69 kg/m   BMI: Body mass index is 24.69  kg/m. GEN: Well nourished, well developed thin AAM, in no acute distress  HEENT: normocephalic, atraumatic. Arcus senilis present Neck: no JVD, carotid bruits, or masses Cardiac: RRR; no murmurs, rubs, or gallops, no edema  Respiratory:  clear to auscultation bilaterally, normal work of breathing GI: soft, nontender, nondistended, + BS MS: no deformity or atrophy  Skin: warm and dry, no rash Neuro:  Alert and Oriented x 3, Strength and sensation are intact, follows commands Psych: euthymic mood, full affect  Wt Readings from Last 3 Encounters:  08/03/16 153 lb (69.4 kg)  07/20/16 149 lb 4.8 oz (67.7 kg)  07/20/16 149 lb (67.6 kg)      Studies/Labs Reviewed:   EKG:  EKG was ordered today and personally reviewed by me and demonstrates sinus bradycardia 54bpm, LVH with repol abnormality, no significant difference from prior.   Recent Labs: 12/07/2015: TSH 3.44 02/29/2016: ALT 12; BUN 75; Creatinine, Ser 2.23; Hemoglobin 9.3; Platelets 229; Potassium 5.4; Sodium 136   Lipid Panel    Component Value Date/Time   CHOL 160 06/09/2011 1122   TRIG 89.0 06/09/2011 1122   HDL 32.40 (L) 06/09/2011 1122   CHOLHDL 5 06/09/2011 1122   VLDL 17.8 06/09/2011 1122   LDLCALC 110 (H) 06/09/2011 1122    Additional studies/ records that were reviewed today include: Summarized above, and:  Carotid US 2/17: R 50-69, L plaque  Even Monitor 1/16 Sinus brady; no pauses  TEE 10/15 EF 30-35, no RWMA, dilated LA; retained portion of RV lead in RV apex with no remnant insulation or vegetation  TEE 10/15 EF 30-35, severe nonmobile plaque and ascending thoracic aorta, mild MR, mild PI, large 25 x 4 mm highly mobile vegetation attached to the RA portion of the RV pacer lead  Echo 9/15 Mild LVH, EF 30-35, diffuse hypokinesis, grade 1 diastolic dysfunction, trivial AI, trivial MR, moderate LAE, mild RAE, mild TR, PASP 39  LHC 4/04 --> CABG LM mid 60-70 LAD with first anterolateral branch  70 RCA proximal to mid 80 EF 40    ASSESSMENT & PLAN:   1. Chest pain, atypical - history-taking limited by patient's  dementia. Overall he does not feel there has been significant increase in episodes from prior. EKG remains similar to past tracings and HR is normal today. He is currently being worked up for possible syncope/dizziness, so would be hesitant to aggressively titrate med regimen at present. I would not want to drop his BP too low. We talked about various options including continued surveillance of symptoms versus increasing Imdur empirically. Per our discussion, will follow symptoms for know. He knows to call if symptoms increase. Daughter also made aware. Continue ASA. We called his PCP's office to find out if he has had any recent labs since the ones in Feb 2018 - they have not had any recheck since October. Will obtain BMET, CBC to r/o any progressive abnormalities which could be contributing to his recent issues. 2. CAD - continue ASA. Not on BB due to bradycardia. Given his CKD stage IV, dementia, metastatic prostate CA, and chronic anemia, he would be a poor candidate for invasive procedures so would continue to focus on medical therapy. 3. Chronic systolic CHF/ICM - appears euvolemic at present time. No ACEI/ARB given CKD. No BB given bradycardia. Continue present regimen. 4. Bradycardia - monitor just placed yesterday. Check lytes today. TSH in 11/2015 for same issue was normal. Continue monitor per EP.  Disposition: F/u with EP in 6 weeks to review monitor, and 2-3 months with Dr. Acie Fredrickson.   Medication Adjustments/Labs and Tests Ordered: Current medicines are reviewed at length with the patient today.  Concerns regarding medicines are outlined above. Medication changes, Labs and Tests ordered today are summarized above and listed in the Patient Instructions accessible in Encounters.   Signed, Charlie Pitter, PA-C  08/03/2016 10:01 AM    [facility]  [facility] [place], [place], [place]  [address] Phone: [phone]; Fax: [phone]

## 2016-08-02 NOTE — Telephone Encounter (Signed)
Called, spoke with pt's daughter, Luanna Salk. Daughter stated she would like appt tomorrow morning. Scheduled appt with Melina Copa, PA for 9:30 Am, pt arriving at 9:15 AM. Daughter verbalized understanding and thanked me for calling.

## 2016-08-02 NOTE — Telephone Encounter (Signed)
Called, spoke with pt's daughter, Colin [name] (on [place]). Informed Dr. Acie Fredrickson would like pt seen by NP or PA in our office to evaluate chest tightness. Offered appt today with Nell Range, PA at 3:30 PM. Daughter declined to schedule, at this time. She would like to call back after checking to see if she can get him here. Informed the time slot may be taken by the time she calls back. Daughter verbalized understanding and stated she will call back as soon as possible.

## 2016-08-02 NOTE — Telephone Encounter (Signed)
Follow up     Pt daughter wants to know if you can squeeze them in tomorrow morning ? Please call

## 2016-08-03 ENCOUNTER — Ambulatory Visit (INDEPENDENT_AMBULATORY_CARE_PROVIDER_SITE_OTHER): Payer: Medicare Other | Admitting: Physician Assistant

## 2016-08-03 ENCOUNTER — Encounter: Payer: Self-pay | Admitting: Physician Assistant

## 2016-08-03 VITALS — BP 150/56 | HR 53 | Ht 66.0 in | Wt 153.0 lb

## 2016-08-03 DIAGNOSIS — I2510 Atherosclerotic heart disease of native coronary artery without angina pectoris: Secondary | ICD-10-CM | POA: Diagnosis not present

## 2016-08-03 DIAGNOSIS — R0789 Other chest pain: Secondary | ICD-10-CM | POA: Diagnosis not present

## 2016-08-03 DIAGNOSIS — I5022 Chronic systolic (congestive) heart failure: Secondary | ICD-10-CM | POA: Diagnosis not present

## 2016-08-03 DIAGNOSIS — R001 Bradycardia, unspecified: Secondary | ICD-10-CM

## 2016-08-03 DIAGNOSIS — E1121 Type 2 diabetes mellitus with diabetic nephropathy: Secondary | ICD-10-CM | POA: Diagnosis not present

## 2016-08-03 LAB — BASIC METABOLIC PANEL
BUN/Creatinine Ratio: 41 — ABNORMAL HIGH (ref 10–24)
BUN: 87 mg/dL (ref 8–27)
CO2: 20 mmol/L (ref 20–29)
CREATININE: 2.11 mg/dL — AB (ref 0.76–1.27)
Calcium: 9.9 mg/dL (ref 8.6–10.2)
Chloride: 101 mmol/L (ref 96–106)
GFR, EST AFRICAN AMERICAN: 32 mL/min/{1.73_m2} — AB (ref >59)
GFR, EST NON AFRICAN AMERICAN: 28 mL/min/{1.73_m2} — AB (ref >59)
Glucose: 182 mg/dL — ABNORMAL HIGH (ref 65–99)
Potassium: 4.3 mmol/L (ref 3.5–5.2)
SODIUM: 139 mmol/L (ref 134–144)

## 2016-08-03 LAB — CBC WITH DIFFERENTIAL/PLATELET
BASOS: 0 %
Basophils Absolute: 0 10*3/uL (ref 0.0–0.2)
EOS (ABSOLUTE): 0.1 10*3/uL (ref 0.0–0.4)
EOS: 1 %
HEMATOCRIT: 34.3 % — AB (ref 37.5–51.0)
Hemoglobin: 10.4 g/dL — ABNORMAL LOW (ref 13.0–17.7)
IMMATURE GRANS (ABS): 0 10*3/uL (ref 0.0–0.1)
IMMATURE GRANULOCYTES: 0 %
Lymphocytes Absolute: 1.3 10*3/uL (ref 0.7–3.1)
Lymphs: 23 %
MCH: 25 pg — ABNORMAL LOW (ref 26.6–33.0)
MCHC: 30.3 g/dL — ABNORMAL LOW (ref 31.5–35.7)
MCV: 83 fL (ref 79–97)
MONOS ABS: 0.5 10*3/uL (ref 0.1–0.9)
Monocytes: 9 %
NEUTROS PCT: 67 %
Neutrophils Absolute: 3.8 10*3/uL (ref 1.4–7.0)
Platelets: 256 10*3/uL (ref 150–379)
RBC: 4.16 x10E6/uL (ref 4.14–5.80)
RDW: 15.3 % (ref 12.3–15.4)
WBC: 5.8 10*3/uL (ref 3.4–10.8)

## 2016-08-03 NOTE — Patient Instructions (Signed)
Medication Instructions:  Your physician recommends that you continue on your current medications as directed. Please refer to the Current Medication list given to you today.   Labwork: 1. TODAY BMET, CBC W/DIFF  Testing/Procedures: NONE ORDRED  Follow-Up: 1. DR. Lovena Le IN 6 WEEKS TO FOLLOW UP ON MONITOR RESULTS  2. DR. Cathie Olden IN 2-3 MONTHS     Any Other Special Instructions Will Be Listed Below (If Applicable).     If you need a refill on your cardiac medications before your next appointment, please call your pharmacy.

## 2016-08-10 ENCOUNTER — Telehealth: Payer: Self-pay | Admitting: *Deleted

## 2016-08-10 MED ORDER — FUROSEMIDE 40 MG PO TABS: 20.0000 mg | ORAL_TABLET | Freq: Every day | ORAL | 0 refills | Status: DC

## 2016-08-10 NOTE — Telephone Encounter (Signed)
-----   Message from Charlie Pitter, [place] sent at 08/03/2016  5:07 PM EDT ----- Please let patient know labs are overall stable except BUN is higher than prior.  I would recommend decreasing Lasix to 20mg  daily, with 1 extra tablet daily as needed for weight gain of 3lb or increased SOB/swelling. Would encourage him to increase his water intake over the next 2 days to 64oz per day. Would recommend he get into see primary care in the next few days to revisit monitoring of his kidney function and determine if he needs any BP med adjustment since we are reducing diuretic - please fax a copy to their office. Dayna Dunn PA-C

## 2016-08-12 DIAGNOSIS — I509 Heart failure, unspecified: Secondary | ICD-10-CM | POA: Diagnosis not present

## 2016-08-12 DIAGNOSIS — K5900 Constipation, unspecified: Secondary | ICD-10-CM | POA: Diagnosis not present

## 2016-08-12 DIAGNOSIS — E1122 Type 2 diabetes mellitus with diabetic chronic kidney disease: Secondary | ICD-10-CM | POA: Diagnosis not present

## 2016-08-12 DIAGNOSIS — N184 Chronic kidney disease, stage 4 (severe): Secondary | ICD-10-CM | POA: Diagnosis not present

## 2016-08-12 DIAGNOSIS — I2510 Atherosclerotic heart disease of native coronary artery without angina pectoris: Secondary | ICD-10-CM | POA: Diagnosis not present

## 2016-08-12 DIAGNOSIS — E1140 Type 2 diabetes mellitus with diabetic neuropathy, unspecified: Secondary | ICD-10-CM | POA: Diagnosis not present

## 2016-08-12 DIAGNOSIS — I130 Hypertensive heart and chronic kidney disease with heart failure and stage 1 through stage 4 chronic kidney disease, or unspecified chronic kidney disease: Secondary | ICD-10-CM | POA: Diagnosis not present

## 2016-08-12 DIAGNOSIS — J449 Chronic obstructive pulmonary disease, unspecified: Secondary | ICD-10-CM | POA: Diagnosis not present

## 2016-08-12 DIAGNOSIS — D631 Anemia in chronic kidney disease: Secondary | ICD-10-CM | POA: Diagnosis not present

## 2016-08-12 DIAGNOSIS — E1151 Type 2 diabetes mellitus with diabetic peripheral angiopathy without gangrene: Secondary | ICD-10-CM | POA: Diagnosis not present

## 2016-08-16 DIAGNOSIS — E1122 Type 2 diabetes mellitus with diabetic chronic kidney disease: Secondary | ICD-10-CM | POA: Diagnosis not present

## 2016-08-16 DIAGNOSIS — I2510 Atherosclerotic heart disease of native coronary artery without angina pectoris: Secondary | ICD-10-CM | POA: Diagnosis not present

## 2016-08-16 DIAGNOSIS — D631 Anemia in chronic kidney disease: Secondary | ICD-10-CM | POA: Diagnosis not present

## 2016-08-16 DIAGNOSIS — K5900 Constipation, unspecified: Secondary | ICD-10-CM | POA: Diagnosis not present

## 2016-08-16 DIAGNOSIS — E1140 Type 2 diabetes mellitus with diabetic neuropathy, unspecified: Secondary | ICD-10-CM | POA: Diagnosis not present

## 2016-08-16 DIAGNOSIS — J449 Chronic obstructive pulmonary disease, unspecified: Secondary | ICD-10-CM | POA: Diagnosis not present

## 2016-08-16 DIAGNOSIS — E1151 Type 2 diabetes mellitus with diabetic peripheral angiopathy without gangrene: Secondary | ICD-10-CM | POA: Diagnosis not present

## 2016-08-16 DIAGNOSIS — N184 Chronic kidney disease, stage 4 (severe): Secondary | ICD-10-CM | POA: Diagnosis not present

## 2016-08-16 DIAGNOSIS — I130 Hypertensive heart and chronic kidney disease with heart failure and stage 1 through stage 4 chronic kidney disease, or unspecified chronic kidney disease: Secondary | ICD-10-CM | POA: Diagnosis not present

## 2016-08-16 DIAGNOSIS — I509 Heart failure, unspecified: Secondary | ICD-10-CM | POA: Diagnosis not present

## 2016-08-17 DIAGNOSIS — I509 Heart failure, unspecified: Secondary | ICD-10-CM | POA: Diagnosis not present

## 2016-08-17 DIAGNOSIS — I130 Hypertensive heart and chronic kidney disease with heart failure and stage 1 through stage 4 chronic kidney disease, or unspecified chronic kidney disease: Secondary | ICD-10-CM | POA: Diagnosis not present

## 2016-08-17 DIAGNOSIS — E1140 Type 2 diabetes mellitus with diabetic neuropathy, unspecified: Secondary | ICD-10-CM | POA: Diagnosis not present

## 2016-08-17 DIAGNOSIS — I2510 Atherosclerotic heart disease of native coronary artery without angina pectoris: Secondary | ICD-10-CM | POA: Diagnosis not present

## 2016-08-17 DIAGNOSIS — J449 Chronic obstructive pulmonary disease, unspecified: Secondary | ICD-10-CM | POA: Diagnosis not present

## 2016-08-17 DIAGNOSIS — E1122 Type 2 diabetes mellitus with diabetic chronic kidney disease: Secondary | ICD-10-CM | POA: Diagnosis not present

## 2016-08-17 DIAGNOSIS — K5900 Constipation, unspecified: Secondary | ICD-10-CM | POA: Diagnosis not present

## 2016-08-17 DIAGNOSIS — E1151 Type 2 diabetes mellitus with diabetic peripheral angiopathy without gangrene: Secondary | ICD-10-CM | POA: Diagnosis not present

## 2016-08-17 DIAGNOSIS — D631 Anemia in chronic kidney disease: Secondary | ICD-10-CM | POA: Diagnosis not present

## 2016-08-17 DIAGNOSIS — N184 Chronic kidney disease, stage 4 (severe): Secondary | ICD-10-CM | POA: Diagnosis not present

## 2016-08-18 DIAGNOSIS — E1151 Type 2 diabetes mellitus with diabetic peripheral angiopathy without gangrene: Secondary | ICD-10-CM | POA: Diagnosis not present

## 2016-08-18 DIAGNOSIS — I130 Hypertensive heart and chronic kidney disease with heart failure and stage 1 through stage 4 chronic kidney disease, or unspecified chronic kidney disease: Secondary | ICD-10-CM | POA: Diagnosis not present

## 2016-08-18 DIAGNOSIS — I2510 Atherosclerotic heart disease of native coronary artery without angina pectoris: Secondary | ICD-10-CM | POA: Diagnosis not present

## 2016-08-18 DIAGNOSIS — J449 Chronic obstructive pulmonary disease, unspecified: Secondary | ICD-10-CM | POA: Diagnosis not present

## 2016-08-18 DIAGNOSIS — D631 Anemia in chronic kidney disease: Secondary | ICD-10-CM | POA: Diagnosis not present

## 2016-08-18 DIAGNOSIS — K5900 Constipation, unspecified: Secondary | ICD-10-CM | POA: Diagnosis not present

## 2016-08-18 DIAGNOSIS — I509 Heart failure, unspecified: Secondary | ICD-10-CM | POA: Diagnosis not present

## 2016-08-18 DIAGNOSIS — N184 Chronic kidney disease, stage 4 (severe): Secondary | ICD-10-CM | POA: Diagnosis not present

## 2016-08-18 DIAGNOSIS — E1140 Type 2 diabetes mellitus with diabetic neuropathy, unspecified: Secondary | ICD-10-CM | POA: Diagnosis not present

## 2016-08-18 DIAGNOSIS — E1122 Type 2 diabetes mellitus with diabetic chronic kidney disease: Secondary | ICD-10-CM | POA: Diagnosis not present

## 2016-08-21 DIAGNOSIS — E1151 Type 2 diabetes mellitus with diabetic peripheral angiopathy without gangrene: Secondary | ICD-10-CM | POA: Diagnosis not present

## 2016-08-21 DIAGNOSIS — K5900 Constipation, unspecified: Secondary | ICD-10-CM | POA: Diagnosis not present

## 2016-08-21 DIAGNOSIS — I130 Hypertensive heart and chronic kidney disease with heart failure and stage 1 through stage 4 chronic kidney disease, or unspecified chronic kidney disease: Secondary | ICD-10-CM | POA: Diagnosis not present

## 2016-08-21 DIAGNOSIS — I2510 Atherosclerotic heart disease of native coronary artery without angina pectoris: Secondary | ICD-10-CM | POA: Diagnosis not present

## 2016-08-21 DIAGNOSIS — J449 Chronic obstructive pulmonary disease, unspecified: Secondary | ICD-10-CM | POA: Diagnosis not present

## 2016-08-21 DIAGNOSIS — I509 Heart failure, unspecified: Secondary | ICD-10-CM | POA: Diagnosis not present

## 2016-08-21 DIAGNOSIS — D631 Anemia in chronic kidney disease: Secondary | ICD-10-CM | POA: Diagnosis not present

## 2016-08-21 DIAGNOSIS — E1122 Type 2 diabetes mellitus with diabetic chronic kidney disease: Secondary | ICD-10-CM | POA: Diagnosis not present

## 2016-08-21 DIAGNOSIS — E1140 Type 2 diabetes mellitus with diabetic neuropathy, unspecified: Secondary | ICD-10-CM | POA: Diagnosis not present

## 2016-08-21 DIAGNOSIS — N184 Chronic kidney disease, stage 4 (severe): Secondary | ICD-10-CM | POA: Diagnosis not present

## 2016-08-22 DIAGNOSIS — E1122 Type 2 diabetes mellitus with diabetic chronic kidney disease: Secondary | ICD-10-CM | POA: Diagnosis not present

## 2016-08-22 DIAGNOSIS — E1151 Type 2 diabetes mellitus with diabetic peripheral angiopathy without gangrene: Secondary | ICD-10-CM | POA: Diagnosis not present

## 2016-08-22 DIAGNOSIS — K5900 Constipation, unspecified: Secondary | ICD-10-CM | POA: Diagnosis not present

## 2016-08-22 DIAGNOSIS — I2510 Atherosclerotic heart disease of native coronary artery without angina pectoris: Secondary | ICD-10-CM | POA: Diagnosis not present

## 2016-08-22 DIAGNOSIS — I130 Hypertensive heart and chronic kidney disease with heart failure and stage 1 through stage 4 chronic kidney disease, or unspecified chronic kidney disease: Secondary | ICD-10-CM | POA: Diagnosis not present

## 2016-08-22 DIAGNOSIS — E1140 Type 2 diabetes mellitus with diabetic neuropathy, unspecified: Secondary | ICD-10-CM | POA: Diagnosis not present

## 2016-08-22 DIAGNOSIS — I509 Heart failure, unspecified: Secondary | ICD-10-CM | POA: Diagnosis not present

## 2016-08-22 DIAGNOSIS — D631 Anemia in chronic kidney disease: Secondary | ICD-10-CM | POA: Diagnosis not present

## 2016-08-22 DIAGNOSIS — N184 Chronic kidney disease, stage 4 (severe): Secondary | ICD-10-CM | POA: Diagnosis not present

## 2016-08-22 DIAGNOSIS — J449 Chronic obstructive pulmonary disease, unspecified: Secondary | ICD-10-CM | POA: Diagnosis not present

## 2016-08-23 DIAGNOSIS — I130 Hypertensive heart and chronic kidney disease with heart failure and stage 1 through stage 4 chronic kidney disease, or unspecified chronic kidney disease: Secondary | ICD-10-CM | POA: Diagnosis not present

## 2016-08-23 DIAGNOSIS — E1122 Type 2 diabetes mellitus with diabetic chronic kidney disease: Secondary | ICD-10-CM | POA: Diagnosis not present

## 2016-08-23 DIAGNOSIS — J449 Chronic obstructive pulmonary disease, unspecified: Secondary | ICD-10-CM | POA: Diagnosis not present

## 2016-08-23 DIAGNOSIS — E1151 Type 2 diabetes mellitus with diabetic peripheral angiopathy without gangrene: Secondary | ICD-10-CM | POA: Diagnosis not present

## 2016-08-23 DIAGNOSIS — E1140 Type 2 diabetes mellitus with diabetic neuropathy, unspecified: Secondary | ICD-10-CM | POA: Diagnosis not present

## 2016-08-23 DIAGNOSIS — N184 Chronic kidney disease, stage 4 (severe): Secondary | ICD-10-CM | POA: Diagnosis not present

## 2016-08-23 DIAGNOSIS — I2510 Atherosclerotic heart disease of native coronary artery without angina pectoris: Secondary | ICD-10-CM | POA: Diagnosis not present

## 2016-08-23 DIAGNOSIS — D631 Anemia in chronic kidney disease: Secondary | ICD-10-CM | POA: Diagnosis not present

## 2016-08-23 DIAGNOSIS — I509 Heart failure, unspecified: Secondary | ICD-10-CM | POA: Diagnosis not present

## 2016-08-23 DIAGNOSIS — K5900 Constipation, unspecified: Secondary | ICD-10-CM | POA: Diagnosis not present

## 2016-08-25 DIAGNOSIS — I130 Hypertensive heart and chronic kidney disease with heart failure and stage 1 through stage 4 chronic kidney disease, or unspecified chronic kidney disease: Secondary | ICD-10-CM | POA: Diagnosis not present

## 2016-08-25 DIAGNOSIS — I2510 Atherosclerotic heart disease of native coronary artery without angina pectoris: Secondary | ICD-10-CM | POA: Diagnosis not present

## 2016-08-25 DIAGNOSIS — E1151 Type 2 diabetes mellitus with diabetic peripheral angiopathy without gangrene: Secondary | ICD-10-CM | POA: Diagnosis not present

## 2016-08-25 DIAGNOSIS — J449 Chronic obstructive pulmonary disease, unspecified: Secondary | ICD-10-CM | POA: Diagnosis not present

## 2016-08-25 DIAGNOSIS — D631 Anemia in chronic kidney disease: Secondary | ICD-10-CM | POA: Diagnosis not present

## 2016-08-25 DIAGNOSIS — E1122 Type 2 diabetes mellitus with diabetic chronic kidney disease: Secondary | ICD-10-CM | POA: Diagnosis not present

## 2016-08-25 DIAGNOSIS — E1140 Type 2 diabetes mellitus with diabetic neuropathy, unspecified: Secondary | ICD-10-CM | POA: Diagnosis not present

## 2016-08-25 DIAGNOSIS — I509 Heart failure, unspecified: Secondary | ICD-10-CM | POA: Diagnosis not present

## 2016-08-25 DIAGNOSIS — K5900 Constipation, unspecified: Secondary | ICD-10-CM | POA: Diagnosis not present

## 2016-08-25 DIAGNOSIS — N184 Chronic kidney disease, stage 4 (severe): Secondary | ICD-10-CM | POA: Diagnosis not present

## 2016-08-26 DIAGNOSIS — E1140 Type 2 diabetes mellitus with diabetic neuropathy, unspecified: Secondary | ICD-10-CM | POA: Diagnosis not present

## 2016-08-26 DIAGNOSIS — I2510 Atherosclerotic heart disease of native coronary artery without angina pectoris: Secondary | ICD-10-CM | POA: Diagnosis not present

## 2016-08-26 DIAGNOSIS — I509 Heart failure, unspecified: Secondary | ICD-10-CM | POA: Diagnosis not present

## 2016-08-26 DIAGNOSIS — E1122 Type 2 diabetes mellitus with diabetic chronic kidney disease: Secondary | ICD-10-CM | POA: Diagnosis not present

## 2016-08-26 DIAGNOSIS — E1151 Type 2 diabetes mellitus with diabetic peripheral angiopathy without gangrene: Secondary | ICD-10-CM | POA: Diagnosis not present

## 2016-08-26 DIAGNOSIS — N184 Chronic kidney disease, stage 4 (severe): Secondary | ICD-10-CM | POA: Diagnosis not present

## 2016-08-26 DIAGNOSIS — K5900 Constipation, unspecified: Secondary | ICD-10-CM | POA: Diagnosis not present

## 2016-08-26 DIAGNOSIS — J449 Chronic obstructive pulmonary disease, unspecified: Secondary | ICD-10-CM | POA: Diagnosis not present

## 2016-08-26 DIAGNOSIS — I130 Hypertensive heart and chronic kidney disease with heart failure and stage 1 through stage 4 chronic kidney disease, or unspecified chronic kidney disease: Secondary | ICD-10-CM | POA: Diagnosis not present

## 2016-08-26 DIAGNOSIS — D631 Anemia in chronic kidney disease: Secondary | ICD-10-CM | POA: Diagnosis not present

## 2016-08-28 DIAGNOSIS — I2510 Atherosclerotic heart disease of native coronary artery without angina pectoris: Secondary | ICD-10-CM | POA: Diagnosis not present

## 2016-08-28 DIAGNOSIS — D631 Anemia in chronic kidney disease: Secondary | ICD-10-CM | POA: Diagnosis not present

## 2016-08-28 DIAGNOSIS — K5900 Constipation, unspecified: Secondary | ICD-10-CM | POA: Diagnosis not present

## 2016-08-28 DIAGNOSIS — E1140 Type 2 diabetes mellitus with diabetic neuropathy, unspecified: Secondary | ICD-10-CM | POA: Diagnosis not present

## 2016-08-28 DIAGNOSIS — E1122 Type 2 diabetes mellitus with diabetic chronic kidney disease: Secondary | ICD-10-CM | POA: Diagnosis not present

## 2016-08-28 DIAGNOSIS — N184 Chronic kidney disease, stage 4 (severe): Secondary | ICD-10-CM | POA: Diagnosis not present

## 2016-08-28 DIAGNOSIS — I509 Heart failure, unspecified: Secondary | ICD-10-CM | POA: Diagnosis not present

## 2016-08-28 DIAGNOSIS — J449 Chronic obstructive pulmonary disease, unspecified: Secondary | ICD-10-CM | POA: Diagnosis not present

## 2016-08-28 DIAGNOSIS — E1151 Type 2 diabetes mellitus with diabetic peripheral angiopathy without gangrene: Secondary | ICD-10-CM | POA: Diagnosis not present

## 2016-08-28 DIAGNOSIS — I130 Hypertensive heart and chronic kidney disease with heart failure and stage 1 through stage 4 chronic kidney disease, or unspecified chronic kidney disease: Secondary | ICD-10-CM | POA: Diagnosis not present

## 2016-08-29 DIAGNOSIS — D631 Anemia in chronic kidney disease: Secondary | ICD-10-CM | POA: Diagnosis not present

## 2016-08-29 DIAGNOSIS — I130 Hypertensive heart and chronic kidney disease with heart failure and stage 1 through stage 4 chronic kidney disease, or unspecified chronic kidney disease: Secondary | ICD-10-CM | POA: Diagnosis not present

## 2016-08-29 DIAGNOSIS — E1122 Type 2 diabetes mellitus with diabetic chronic kidney disease: Secondary | ICD-10-CM | POA: Diagnosis not present

## 2016-08-29 DIAGNOSIS — K5900 Constipation, unspecified: Secondary | ICD-10-CM | POA: Diagnosis not present

## 2016-08-29 DIAGNOSIS — E1140 Type 2 diabetes mellitus with diabetic neuropathy, unspecified: Secondary | ICD-10-CM | POA: Diagnosis not present

## 2016-08-29 DIAGNOSIS — E1151 Type 2 diabetes mellitus with diabetic peripheral angiopathy without gangrene: Secondary | ICD-10-CM | POA: Diagnosis not present

## 2016-08-29 DIAGNOSIS — I2510 Atherosclerotic heart disease of native coronary artery without angina pectoris: Secondary | ICD-10-CM | POA: Diagnosis not present

## 2016-08-29 DIAGNOSIS — I509 Heart failure, unspecified: Secondary | ICD-10-CM | POA: Diagnosis not present

## 2016-08-29 DIAGNOSIS — J449 Chronic obstructive pulmonary disease, unspecified: Secondary | ICD-10-CM | POA: Diagnosis not present

## 2016-08-29 DIAGNOSIS — N184 Chronic kidney disease, stage 4 (severe): Secondary | ICD-10-CM | POA: Diagnosis not present

## 2016-08-30 DIAGNOSIS — J449 Chronic obstructive pulmonary disease, unspecified: Secondary | ICD-10-CM | POA: Diagnosis not present

## 2016-08-30 DIAGNOSIS — I2510 Atherosclerotic heart disease of native coronary artery without angina pectoris: Secondary | ICD-10-CM | POA: Diagnosis not present

## 2016-08-30 DIAGNOSIS — K5900 Constipation, unspecified: Secondary | ICD-10-CM | POA: Diagnosis not present

## 2016-08-30 DIAGNOSIS — I130 Hypertensive heart and chronic kidney disease with heart failure and stage 1 through stage 4 chronic kidney disease, or unspecified chronic kidney disease: Secondary | ICD-10-CM | POA: Diagnosis not present

## 2016-08-30 DIAGNOSIS — N184 Chronic kidney disease, stage 4 (severe): Secondary | ICD-10-CM | POA: Diagnosis not present

## 2016-08-30 DIAGNOSIS — E1122 Type 2 diabetes mellitus with diabetic chronic kidney disease: Secondary | ICD-10-CM | POA: Diagnosis not present

## 2016-08-30 DIAGNOSIS — D631 Anemia in chronic kidney disease: Secondary | ICD-10-CM | POA: Diagnosis not present

## 2016-08-30 DIAGNOSIS — I509 Heart failure, unspecified: Secondary | ICD-10-CM | POA: Diagnosis not present

## 2016-08-30 DIAGNOSIS — E1140 Type 2 diabetes mellitus with diabetic neuropathy, unspecified: Secondary | ICD-10-CM | POA: Diagnosis not present

## 2016-08-30 DIAGNOSIS — E1151 Type 2 diabetes mellitus with diabetic peripheral angiopathy without gangrene: Secondary | ICD-10-CM | POA: Diagnosis not present

## 2016-08-31 DIAGNOSIS — J449 Chronic obstructive pulmonary disease, unspecified: Secondary | ICD-10-CM | POA: Diagnosis not present

## 2016-08-31 DIAGNOSIS — E1122 Type 2 diabetes mellitus with diabetic chronic kidney disease: Secondary | ICD-10-CM | POA: Diagnosis not present

## 2016-08-31 DIAGNOSIS — I2510 Atherosclerotic heart disease of native coronary artery without angina pectoris: Secondary | ICD-10-CM | POA: Diagnosis not present

## 2016-08-31 DIAGNOSIS — I509 Heart failure, unspecified: Secondary | ICD-10-CM | POA: Diagnosis not present

## 2016-08-31 DIAGNOSIS — E1151 Type 2 diabetes mellitus with diabetic peripheral angiopathy without gangrene: Secondary | ICD-10-CM | POA: Diagnosis not present

## 2016-08-31 DIAGNOSIS — N184 Chronic kidney disease, stage 4 (severe): Secondary | ICD-10-CM | POA: Diagnosis not present

## 2016-08-31 DIAGNOSIS — D631 Anemia in chronic kidney disease: Secondary | ICD-10-CM | POA: Diagnosis not present

## 2016-08-31 DIAGNOSIS — I130 Hypertensive heart and chronic kidney disease with heart failure and stage 1 through stage 4 chronic kidney disease, or unspecified chronic kidney disease: Secondary | ICD-10-CM | POA: Diagnosis not present

## 2016-08-31 DIAGNOSIS — K5900 Constipation, unspecified: Secondary | ICD-10-CM | POA: Diagnosis not present

## 2016-08-31 DIAGNOSIS — E1140 Type 2 diabetes mellitus with diabetic neuropathy, unspecified: Secondary | ICD-10-CM | POA: Diagnosis not present

## 2016-09-05 DIAGNOSIS — E1151 Type 2 diabetes mellitus with diabetic peripheral angiopathy without gangrene: Secondary | ICD-10-CM | POA: Diagnosis not present

## 2016-09-05 DIAGNOSIS — D631 Anemia in chronic kidney disease: Secondary | ICD-10-CM | POA: Diagnosis not present

## 2016-09-05 DIAGNOSIS — I130 Hypertensive heart and chronic kidney disease with heart failure and stage 1 through stage 4 chronic kidney disease, or unspecified chronic kidney disease: Secondary | ICD-10-CM | POA: Diagnosis not present

## 2016-09-05 DIAGNOSIS — I2510 Atherosclerotic heart disease of native coronary artery without angina pectoris: Secondary | ICD-10-CM | POA: Diagnosis not present

## 2016-09-05 DIAGNOSIS — E1140 Type 2 diabetes mellitus with diabetic neuropathy, unspecified: Secondary | ICD-10-CM | POA: Diagnosis not present

## 2016-09-05 DIAGNOSIS — J449 Chronic obstructive pulmonary disease, unspecified: Secondary | ICD-10-CM | POA: Diagnosis not present

## 2016-09-05 DIAGNOSIS — I509 Heart failure, unspecified: Secondary | ICD-10-CM | POA: Diagnosis not present

## 2016-09-05 DIAGNOSIS — E1122 Type 2 diabetes mellitus with diabetic chronic kidney disease: Secondary | ICD-10-CM | POA: Diagnosis not present

## 2016-09-05 DIAGNOSIS — K5900 Constipation, unspecified: Secondary | ICD-10-CM | POA: Diagnosis not present

## 2016-09-05 DIAGNOSIS — N184 Chronic kidney disease, stage 4 (severe): Secondary | ICD-10-CM | POA: Diagnosis not present

## 2016-09-07 DIAGNOSIS — J449 Chronic obstructive pulmonary disease, unspecified: Secondary | ICD-10-CM | POA: Diagnosis not present

## 2016-09-07 DIAGNOSIS — I509 Heart failure, unspecified: Secondary | ICD-10-CM | POA: Diagnosis not present

## 2016-09-07 DIAGNOSIS — D631 Anemia in chronic kidney disease: Secondary | ICD-10-CM | POA: Diagnosis not present

## 2016-09-07 DIAGNOSIS — K5900 Constipation, unspecified: Secondary | ICD-10-CM | POA: Diagnosis not present

## 2016-09-07 DIAGNOSIS — E1151 Type 2 diabetes mellitus with diabetic peripheral angiopathy without gangrene: Secondary | ICD-10-CM | POA: Diagnosis not present

## 2016-09-07 DIAGNOSIS — I2510 Atherosclerotic heart disease of native coronary artery without angina pectoris: Secondary | ICD-10-CM | POA: Diagnosis not present

## 2016-09-07 DIAGNOSIS — E1140 Type 2 diabetes mellitus with diabetic neuropathy, unspecified: Secondary | ICD-10-CM | POA: Diagnosis not present

## 2016-09-07 DIAGNOSIS — I130 Hypertensive heart and chronic kidney disease with heart failure and stage 1 through stage 4 chronic kidney disease, or unspecified chronic kidney disease: Secondary | ICD-10-CM | POA: Diagnosis not present

## 2016-09-07 DIAGNOSIS — E1122 Type 2 diabetes mellitus with diabetic chronic kidney disease: Secondary | ICD-10-CM | POA: Diagnosis not present

## 2016-09-07 DIAGNOSIS — N184 Chronic kidney disease, stage 4 (severe): Secondary | ICD-10-CM | POA: Diagnosis not present

## 2016-09-12 DIAGNOSIS — E1140 Type 2 diabetes mellitus with diabetic neuropathy, unspecified: Secondary | ICD-10-CM | POA: Diagnosis not present

## 2016-09-12 DIAGNOSIS — E1151 Type 2 diabetes mellitus with diabetic peripheral angiopathy without gangrene: Secondary | ICD-10-CM | POA: Diagnosis not present

## 2016-09-12 DIAGNOSIS — K5900 Constipation, unspecified: Secondary | ICD-10-CM | POA: Diagnosis not present

## 2016-09-12 DIAGNOSIS — I2510 Atherosclerotic heart disease of native coronary artery without angina pectoris: Secondary | ICD-10-CM | POA: Diagnosis not present

## 2016-09-12 DIAGNOSIS — I509 Heart failure, unspecified: Secondary | ICD-10-CM | POA: Diagnosis not present

## 2016-09-12 DIAGNOSIS — D631 Anemia in chronic kidney disease: Secondary | ICD-10-CM | POA: Diagnosis not present

## 2016-09-12 DIAGNOSIS — N184 Chronic kidney disease, stage 4 (severe): Secondary | ICD-10-CM | POA: Diagnosis not present

## 2016-09-12 DIAGNOSIS — J449 Chronic obstructive pulmonary disease, unspecified: Secondary | ICD-10-CM | POA: Diagnosis not present

## 2016-09-12 DIAGNOSIS — I130 Hypertensive heart and chronic kidney disease with heart failure and stage 1 through stage 4 chronic kidney disease, or unspecified chronic kidney disease: Secondary | ICD-10-CM | POA: Diagnosis not present

## 2016-09-12 DIAGNOSIS — E1122 Type 2 diabetes mellitus with diabetic chronic kidney disease: Secondary | ICD-10-CM | POA: Diagnosis not present

## 2016-09-13 DIAGNOSIS — D631 Anemia in chronic kidney disease: Secondary | ICD-10-CM | POA: Diagnosis not present

## 2016-09-13 DIAGNOSIS — N184 Chronic kidney disease, stage 4 (severe): Secondary | ICD-10-CM | POA: Diagnosis not present

## 2016-09-13 DIAGNOSIS — N2581 Secondary hyperparathyroidism of renal origin: Secondary | ICD-10-CM | POA: Diagnosis not present

## 2016-09-14 DIAGNOSIS — J449 Chronic obstructive pulmonary disease, unspecified: Secondary | ICD-10-CM | POA: Diagnosis not present

## 2016-09-14 DIAGNOSIS — E1122 Type 2 diabetes mellitus with diabetic chronic kidney disease: Secondary | ICD-10-CM | POA: Diagnosis not present

## 2016-09-14 DIAGNOSIS — I509 Heart failure, unspecified: Secondary | ICD-10-CM | POA: Diagnosis not present

## 2016-09-14 DIAGNOSIS — D631 Anemia in chronic kidney disease: Secondary | ICD-10-CM | POA: Diagnosis not present

## 2016-09-14 DIAGNOSIS — E1151 Type 2 diabetes mellitus with diabetic peripheral angiopathy without gangrene: Secondary | ICD-10-CM | POA: Diagnosis not present

## 2016-09-14 DIAGNOSIS — E1140 Type 2 diabetes mellitus with diabetic neuropathy, unspecified: Secondary | ICD-10-CM | POA: Diagnosis not present

## 2016-09-14 DIAGNOSIS — I2510 Atherosclerotic heart disease of native coronary artery without angina pectoris: Secondary | ICD-10-CM | POA: Diagnosis not present

## 2016-09-14 DIAGNOSIS — I130 Hypertensive heart and chronic kidney disease with heart failure and stage 1 through stage 4 chronic kidney disease, or unspecified chronic kidney disease: Secondary | ICD-10-CM | POA: Diagnosis not present

## 2016-09-14 DIAGNOSIS — K5900 Constipation, unspecified: Secondary | ICD-10-CM | POA: Diagnosis not present

## 2016-09-14 DIAGNOSIS — N184 Chronic kidney disease, stage 4 (severe): Secondary | ICD-10-CM | POA: Diagnosis not present

## 2016-09-15 ENCOUNTER — Ambulatory Visit (INDEPENDENT_AMBULATORY_CARE_PROVIDER_SITE_OTHER): Payer: Medicare Other | Admitting: Internal Medicine

## 2016-09-15 ENCOUNTER — Encounter: Payer: Self-pay | Admitting: Internal Medicine

## 2016-09-15 ENCOUNTER — Encounter (INDEPENDENT_AMBULATORY_CARE_PROVIDER_SITE_OTHER): Payer: Self-pay

## 2016-09-15 VITALS — BP 140/54 | HR 57 | Ht 67.0 in | Wt 158.4 lb

## 2016-09-15 DIAGNOSIS — R001 Bradycardia, unspecified: Secondary | ICD-10-CM

## 2016-09-15 DIAGNOSIS — R55 Syncope and collapse: Secondary | ICD-10-CM

## 2016-09-15 NOTE — Patient Instructions (Addendum)
Medication Instructions:  Your physician recommends that you continue on your current medications as directed. Please refer to the Current Medication list given to you today.  Labwork: None ordered.  Testing/Procedures: None ordered.  Follow-Up: Your physician recommends that you schedule a follow-up appointment as needed with Dr. Taylor  Any Other Special Instructions Will Be Listed Below (If Applicable).     If you need a refill on your cardiac medications before your next appointment, please call your pharmacy.  

## 2016-09-15 NOTE — Progress Notes (Signed)
HPI [name] returns today for follow-up. He is a very pleasant 83 year old man with a history of sinus node dysfunction, status post pacemaker insertion over 11 years ago. He developed a vegetation on his pacemaker and underwent extraction a couple of years ago. He is worn a heart monitor which demonstrated sinus bradycardia during the daytime down to 40 bpm. He was asymptomatic with this. He has not had syncope. He denies chest pain. He has been diagnosed with metastatic prostate cancer and is undergoing hormonal therapy. His appetite is good. He denies chest pain, shortness of breath, or peripheral edema. No syncope. Allergies  Allergen Reactions  . Lisinopril Other (See Comments)    REACTION:  Elevated potassium,renal insuf     Current Outpatient Prescriptions  Medication Sig Dispense Refill  . aspirin EC 81 MG tablet Take 81 mg by mouth daily.    Marland Kitchen ezetimibe (ZETIA) 10 MG tablet Take 1 tablet (10 mg total) by mouth daily. 90 tablet 3  . ferrous sulfate 325 (65 FE) MG tablet Take 1 tablet (325 mg total) by mouth daily with breakfast.  3  . furosemide (LASIX) 40 MG tablet Take 0.5 tablets (20 mg total) by mouth daily. 30 tablet 0  . hydrALAZINE (APRESOLINE) 10 MG tablet Take 1 tablet (10 mg total) by mouth every 8 (eight) hours. 270 tablet 3  . isosorbide mononitrate (IMDUR) 30 MG 24 hr tablet Take 1 tablet (30 mg total) by mouth daily. 90 tablet 3  . Omega-3 Fatty Acids (FISH OIL) 1000 MG CAPS Take 1,000 mg by mouth daily.     . vitamin B-12 (CYANOCOBALAMIN) 1000 MCG tablet Take 1,000 mcg by mouth daily.     No current facility-administered medications for this visit.      Past Medical History:  Diagnosis Date  . Anemia 10/29/2013  . Arthritis   . Bradycardia   . Cardiomyopathy   . Carotid artery disease ([place])    a. Carotid US 2/17: R 50-69, L plaque.  . Chronic systolic CHF (congestive heart failure) ([place])   . CKD (chronic kidney disease), stage IV ([place])   . COPD  (chronic obstructive pulmonary disease) ([name])   . Coronary artery disease    Status post CABG in 2004  . Dementia   . Diabetes mellitus with peripheral artery disease ([place])   . Dyslipidemia   . GERD (gastroesophageal reflux disease)   . History of anemia   . History of CVA (cerebrovascular accident)   . Hypertension   . Myocardial infarction ([place])   . Peripheral vascular disease ([place])    Status post right femoropopliteal bypass  . Prostate cancer ([place])    metastatic  . SSS (sick sinus syndrome) ([place])    a. s/p PPM 2007 with lead vegetation in 2015 s/p extraction of pacemaker leads.    ROS:   All systems reviewed and negative except as noted in the HPI.   Past Surgical History:  Procedure Laterality Date  . ABDOMINAL ANGIOGRAM  04/30/2013   Procedure: ABDOMINAL ANGIOGRAM;  Surgeon: [name] Misty, MD;  Location: [facility] CATH LAB;  Service: Cardiovascular;;  . AMPUTATION Left 05/05/2013   Procedure: AMPUTATION RAY- FIRST AND SECOND-LEFT FOOT;  Surgeon: Newt Minion, MD;  Location: [place];  Service: Orthopedics;  Laterality: Left;  . CARDIAC CATHETERIZATION  04/29/2002   Dr. Glade Colin  . CORONARY ANGIOPLASTY    . CORONARY ARTERY BYPASS GRAFT  4 of 2004   4 vessel bypass  . ESOPHAGOGASTRODUODENOSCOPY  07/21/2011   Procedure: ESOPHAGOGASTRODUODENOSCOPY (EGD);  Surgeon: [place] Beams, MD;  Location: [facility];  Service: Endoscopy;  Laterality: N/A;  . FEMORAL BYPASS  july 2006   right with right great toe amputation  . FEMORAL-POPLITEAL BYPASS GRAFT Left 05/01/2013   Procedure: LEFT FEMORAL TO ABOVE KNEE POPLITEAL ARTERY BYPASS GRAFT WITH INTRAOPERATIVE ARTEROGRAM;  Surgeon: [name] Misty, MD;  Location: [facility];  Service: Vascular;  Laterality: Left;  . INSERT / REPLACE / REMOVE PACEMAKER  05/17/2005   st. jude dual chamber ddd mode   . LOWER EXTREMITY ANGIOGRAM N/A 04/30/2013   Procedure: LOWER EXTREMITY ANGIOGRAM;  Surgeon: [name] Misty, MD;  Location: [facility] CATH LAB;  Service:  Cardiovascular;  Laterality: N/A;  . PACEMAKER LEAD REMOVAL Right 10/27/2013   Procedure: PACEMAKER LEAD REMOVAL;  Surgeon: Evans Lance, MD;  Location: [name];  Service: Cardiovascular;  Laterality: Right;  Roxy Manns backing up  . TEE WITHOUT CARDIOVERSION N/A 10/23/2013   Procedure: TRANSESOPHAGEAL ECHOCARDIOGRAM (TEE);  Surgeon: Colin Spark, MD;  Location: [place] [facility];  Service: Cardiovascular;  Laterality: N/A;  . TEE WITHOUT CARDIOVERSION N/A 10/28/2013   Procedure: TRANSESOPHAGEAL ECHOCARDIOGRAM (TEE);  Surgeon: Colin Spark, MD;  Location: [facility] ENDOSCOPY;  Service: Cardiovascular;  Laterality: N/A;     Family History  Problem Relation Age of Onset  . Hyperlipidemia Mother   . Heart disease Father   . Diabetes Sister   . Hypertension Sister   . Hyperlipidemia Sister   . Diabetes Unknown      Social History   Social History  . Marital status: Married    Spouse name: N/A  . Number of children: N/A  . Years of education: N/A   Occupational History  . Not on file.   Social History Main Topics  . Smoking status: Former Smoker    Packs/day: 1.00    Years: 50.00    Types: Cigarettes    Quit date: 06/09/2002  . Smokeless tobacco: Never Used  . Alcohol use No  . Drug use: No  . Sexual activity: Not Currently   Other Topics Concern  . Not on file   Social History Narrative   Lives in a house.  Lives with his wife.  Uses a cane for ambulation.       BP (!) 140/54   Pulse (!) 57   Ht 5\' 7"  (1.702 m)   Wt 158 lb 6.4 oz (71.8 kg)   SpO2 99%   BMI 24.81 kg/m   Physical Exam:  Well appearing 83 year old man, NAD HEENT: Unremarkable Neck:  6 cm JVD, no thyromegally Lymphatics:  No adenopathy Back:  No CVA tenderness Lungs:  Clear, with no wheezes, rales, or rhonchi HEART:  Regular bradycardia rhythm, no murmurs, no rubs, no clicks Abd:  soft, positive bowel sounds, no organomegally, no rebound, no guarding Ext:  2 plus pulses, no edema, no cyanosis, no  clubbing Skin:  No rashes no nodules Neuro:  CN II through XII intact, motor grossly intact  Cardiac monitor - sinus bradycardia down to 40 bpm.  Assess/Plan: 1. Sinus node dysfunction - at this point he is essentially asymptomatic. I recommended watchful waiting. 2. Hypertensive heart disease - his blood pressure is elevated a bit today. However with his other comorbidities, I am not particularly interested in aggressively trying to lower his blood pressure as I suspect it would make him feel worse. He is encouraged to maintain a low-sodium diet. 3. Coronary artery disease - he is status post  coronary artery bypass grafting. He is relatively active and denies anginal symptoms. No change in medical therapy.  Cristopher Peru, M.D.

## 2016-09-19 DIAGNOSIS — E1122 Type 2 diabetes mellitus with diabetic chronic kidney disease: Secondary | ICD-10-CM | POA: Diagnosis not present

## 2016-09-19 DIAGNOSIS — I130 Hypertensive heart and chronic kidney disease with heart failure and stage 1 through stage 4 chronic kidney disease, or unspecified chronic kidney disease: Secondary | ICD-10-CM | POA: Diagnosis not present

## 2016-09-19 DIAGNOSIS — D631 Anemia in chronic kidney disease: Secondary | ICD-10-CM | POA: Diagnosis not present

## 2016-09-19 DIAGNOSIS — K5900 Constipation, unspecified: Secondary | ICD-10-CM | POA: Diagnosis not present

## 2016-09-19 DIAGNOSIS — I2510 Atherosclerotic heart disease of native coronary artery without angina pectoris: Secondary | ICD-10-CM | POA: Diagnosis not present

## 2016-09-19 DIAGNOSIS — E1140 Type 2 diabetes mellitus with diabetic neuropathy, unspecified: Secondary | ICD-10-CM | POA: Diagnosis not present

## 2016-09-19 DIAGNOSIS — N184 Chronic kidney disease, stage 4 (severe): Secondary | ICD-10-CM | POA: Diagnosis not present

## 2016-09-19 DIAGNOSIS — E1151 Type 2 diabetes mellitus with diabetic peripheral angiopathy without gangrene: Secondary | ICD-10-CM | POA: Diagnosis not present

## 2016-09-19 DIAGNOSIS — I509 Heart failure, unspecified: Secondary | ICD-10-CM | POA: Diagnosis not present

## 2016-09-19 DIAGNOSIS — J449 Chronic obstructive pulmonary disease, unspecified: Secondary | ICD-10-CM | POA: Diagnosis not present

## 2016-09-28 DIAGNOSIS — I5022 Chronic systolic (congestive) heart failure: Secondary | ICD-10-CM | POA: Diagnosis not present

## 2016-09-28 DIAGNOSIS — I10 Essential (primary) hypertension: Secondary | ICD-10-CM | POA: Diagnosis not present

## 2016-10-06 ENCOUNTER — Encounter: Payer: Self-pay | Admitting: Cardiovascular Disease

## 2016-10-19 ENCOUNTER — Ambulatory Visit: Payer: Medicare Other | Admitting: Cardiovascular Disease

## 2016-10-26 DIAGNOSIS — J449 Chronic obstructive pulmonary disease, unspecified: Secondary | ICD-10-CM | POA: Diagnosis not present

## 2016-10-26 DIAGNOSIS — I2510 Atherosclerotic heart disease of native coronary artery without angina pectoris: Secondary | ICD-10-CM | POA: Diagnosis not present

## 2016-10-26 DIAGNOSIS — N184 Chronic kidney disease, stage 4 (severe): Secondary | ICD-10-CM | POA: Diagnosis not present

## 2016-10-26 DIAGNOSIS — I130 Hypertensive heart and chronic kidney disease with heart failure and stage 1 through stage 4 chronic kidney disease, or unspecified chronic kidney disease: Secondary | ICD-10-CM | POA: Diagnosis not present

## 2016-10-26 DIAGNOSIS — D631 Anemia in chronic kidney disease: Secondary | ICD-10-CM | POA: Diagnosis not present

## 2016-11-02 NOTE — Progress Notes (Deleted)
Cardiology Office Note    Date:  11/02/2016   ID:  Colin [name], DOB 1928-04-03, MRN [id number]  PCP:  Deland Pretty, MD  Cardiologist:  Dr. Acie Fredrickson Electrophysiologist: Dr. Lovena Le  Chief Complaint: CAD and ICM follow up  History of Present Illness:   Colin [name] is a 83 y.o. male with hx of CAD s/p CABG 2774, chronic systolic CHF, ischemic cardiomyopathy, SSS s/p pacemaker 2007 by Dr. Glade Lloyd with extraction of device leads in 2015 after endocarditis/vegetation on pacemaker lead, carotid artery disease,  PAD (s/p L fem to above knee popliteal bypass in 4/15; s/p amp of L 1st and 2nd toes), HL, HTN, DM, CKD stage IV, dementia, metastatic prostate cancer, anemia, CVA presents for follow up.  He was seen by Dr. Acie Fredrickson 10/2015 with weakness and profound sinus bradycardia with a heart rate of 39. His beta blocker was stopped. He's had ongoing evaluation by EP given persistent bradycardia, but unclear how truly symptomatic he is. In the interim he was evaluated in February 2018 with hematuria, pelvic pain, has hx of prostate cancer previously treated, In w/u for this he was found with marked retroperitoneal lymphadenopathy and a lytic lesion on T12. He has had out pt f/u with urology and oncology with diagnosis of metastatic prostate cancer.   Seen by APP 08/03/16 for chest pain. Felt atypical. History limited due to dementia. No workup recommended. Seen by Dr. Lovena Le on 09/15/2016. Recommended observation.    Past Medical History:  Diagnosis Date  . Anemia 10/29/2013  . Arthritis   . Bradycardia   . Cardiomyopathy   . Carotid artery disease ([place])    a. Carotid US 2/17: R 50-69, L plaque.  . Chronic systolic CHF (congestive heart failure) ([place])   . CKD (chronic kidney disease), stage IV ([place])   . COPD (chronic obstructive pulmonary disease) ([place])   . Coronary artery disease    Status post CABG in 2004  . Dementia   . Diabetes mellitus with peripheral artery disease ([place])   .  Dyslipidemia   . GERD (gastroesophageal reflux disease)   . History of anemia   . History of CVA (cerebrovascular accident)   . Hypertension   . Myocardial infarction ([place])   . Peripheral vascular disease ([place])    Status post right femoropopliteal bypass  . Prostate cancer ([place])    metastatic  . SSS (sick sinus syndrome) ([place])    a. s/p PPM 2007 with lead vegetation in 2015 s/p extraction of pacemaker leads.    Past Surgical History:  Procedure Laterality Date  . ABDOMINAL ANGIOGRAM  04/30/2013   Procedure: ABDOMINAL ANGIOGRAM;  Surgeon: [place] Misty, MD;  Location[facility] CATH LAB;  Service: Cardiovascular;;  . AMPUTATION Left 05/05/2013   Procedure: AMPUTATION RAY- FIRST AND SECOND-LEFT FOOT;  Surgeon: Newt Minion, MD;  Location: [place];  Service: Orthopedics;  Laterality: Left;  . CARDIAC CATHETERIZATION  04/29/2002   Dr. Glade Lloyd  . CORONARY ANGIOPLASTY    . CORONARY ARTERY BYPASS GRAFT  4 of 2004   4 vessel bypass  . ESOPHAGOGASTRODUODENOSCOPY  07/21/2011   Procedure: ESOPHAGOGASTRODUODENOSCOPY (EGD);  Surgeon: Beryle Beams, MD;  Location: [facility];  Service: Endoscopy;  Laterality: N/A;  . FEMORAL BYPASS  july 2006   right with right great toe amputation  . FEMORAL-POPLITEAL BYPASS GRAFT Left 05/01/2013   Procedure: LEFT FEMORAL TO ABOVE KNEE POPLITEAL ARTERY BYPASS GRAFT WITH INTRAOPERATIVE ARTEROGRAM;  Surgeon: Mal Misty, MD;  Location: [facility];  Service: Vascular;  Laterality: Left;  . INSERT / REPLACE / REMOVE PACEMAKER  05/17/2005   st. jude dual chamber ddd mode   . LOWER EXTREMITY ANGIOGRAM N/A 04/30/2013   Procedure: LOWER EXTREMITY ANGIOGRAM;  Surgeon: Mal Misty, MD;  Location: [facility] CATH LAB;  Service: Cardiovascular;  Laterality: N/A;  . PACEMAKER LEAD REMOVAL Right 10/27/2013   Procedure: PACEMAKER LEAD REMOVAL;  Surgeon: Evans Lance, MD;  Location: [facility];  Service: Cardiovascular;  Laterality: Right;  Roxy Manns backing up  . TEE WITHOUT CARDIOVERSION N/A 10/23/2013    Procedure: TRANSESOPHAGEAL ECHOCARDIOGRAM (TEE);  Surgeon: Dorothy Spark, MD;  Location: [place];  Service: Cardiovascular;  Laterality: N/A;  . TEE WITHOUT CARDIOVERSION N/A 10/28/2013   Procedure: TRANSESOPHAGEAL ECHOCARDIOGRAM (TEE);  Surgeon: Dorothy Spark, MD;  Location: [facility] ENDOSCOPY;  Service: Cardiovascular;  Laterality: N/A;    Current Medications: Prior to Admission medications   Medication Sig Start Date End Date Taking? Authorizing Provider  aspirin EC 81 MG tablet Take 81 mg by mouth daily.    [provider]  ezetimibe (ZETIA) 10 MG tablet Take 1 tablet (10 mg total) by mouth daily. 11/17/15   Nahser, Wonda Cheng, MD  ferrous sulfate 325 (65 FE) MG tablet Take 1 tablet (325 mg total) by mouth daily with breakfast. 10/31/13   Delfina Redwood, MD  furosemide (LASIX) 40 MG tablet Take 0.5 tablets (20 mg total) by mouth daily. 08/10/16   Dunn, Nedra Hai, PA-C  hydrALAZINE (APRESOLINE) 10 MG tablet Take 1 tablet (10 mg total) by mouth every 8 (eight) hours. 11/17/15   Nahser, Wonda Cheng, MD  isosorbide mononitrate (IMDUR) 30 MG 24 hr tablet Take 1 tablet (30 mg total) by mouth daily. 11/17/15   Nahser, Wonda Cheng, MD  Omega-3 Fatty Acids (FISH OIL) 1000 MG CAPS Take 1,000 mg by mouth daily.     [provider]  vitamin B-12 (CYANOCOBALAMIN) 1000 MCG tablet Take 1,000 mcg by mouth daily.    [provider]    Allergies:   Lisinopril   Social History   Social History  . Marital status: Married    Spouse name: N/A  . Number of children: N/A  . Years of education: N/A   Social History Main Topics  . Smoking status: Former Smoker    Packs/day: 1.00    Years: 50.00    Types: Cigarettes    Quit date: 06/09/2002  . Smokeless tobacco: Never Used  . Alcohol use No  . Drug use: No  . Sexual activity: Not Currently   Other Topics Concern  . Not on file   Social History Narrative   Lives in a house.  Lives with his wife.  Uses a cane for  ambulation.       Family History:  The patient's family history includes Diabetes in his sister and unknown relative; Heart disease in his father; Hyperlipidemia in his mother and sister; Hypertension in his sister. ***  ROS:   Please see the history of present illness.    ROS All other systems reviewed and are negative.   PHYSICAL EXAM:   VS:  There were no vitals taken for this visit.   GEN: Well nourished, well developed, in no acute distress  HEENT: normal  Neck: no JVD, carotid bruits, or masses Cardiac: ***RRR; no murmurs, rubs, or gallops,no edema  Respiratory:  clear to auscultation bilaterally, normal work of breathing GI: soft, nontender, nondistended, + BS MS: no deformity or atrophy  Skin:  warm and dry, no rash Neuro:  Alert and Oriented x 3, Strength and sensation are intact Psych: euthymic mood, full affect  Wt Readings from Last 3 Encounters:  09/15/16 158 lb 6.4 oz (71.8 kg)  08/03/16 153 lb (69.4 kg)  07/20/16 149 lb 4.8 oz (67.7 kg)      Studies/Labs Reviewed:   EKG:  EKG is ordered today.  The ekg ordered today demonstrates ***  Recent Labs: 12/07/2015: TSH 3.44 02/29/2016: ALT 12 08/03/2016: BUN 87; Creatinine, Ser 2.11; Hemoglobin 10.4; Platelets 256; Potassium 4.3; Sodium 139   Lipid Panel    Component Value Date/Time   CHOL 160 06/09/2011 1122   TRIG 89.0 06/09/2011 1122   HDL 32.40 (L) 06/09/2011 1122   CHOLHDL 5 06/09/2011 1122   VLDL 17.8 06/09/2011 1122   LDLCALC 110 (H) 06/09/2011 1122    Additional studies/ records that were reviewed today include:  Monitor 07/2016 Sinus rhythm with sinus bradycardia 2. Frequent PVC's and rare PAC's 3. NS VT 4. Sinus bradycardia down to the 40's.  Carotid US 2/17: R 50-69, L plaque  Even Monitor 1/16 Sinus brady; no pauses  TEE 10/15 EF 30-35, no RWMA, dilated LA; retained portion of RV lead in RV apex with no remnant insulation or vegetation  TEE 10/15 EF 30-35, severe nonmobile plaque and  ascending thoracic aorta, mild MR, mild PI, large 25 x 4 mm highly mobile vegetation attached to the RA portion of the RV pacer lead  Echo 9/15 Mild LVH, EF 30-35, diffuse hypokinesis, grade 1 diastolic dysfunction, trivial AI, trivial MR, moderate LAE, mild RAE, mild TR, PASP 39  LHC 4/04 --> CABG LM mid 60-70 LAD with first anterolateral branch 70 RCA proximal to mid 80 EF 40   ASSESSMENT & PLAN:    1. CAD s/p CABG in 2004  2. ICM/chronic systolic heart failure  3. Hypertensive heart disease with CHF  4. Sinus node dysfunction - Followed by Dr. Lovena Le. Recent monitor July 2018 did not show significant arrhythmia. He commended watchful waiting.    Medication Adjustments/Labs and Tests Ordered: Current medicines are reviewed at length with the patient today.  Concerns regarding medicines are outlined above.  Medication changes, Labs and Tests ordered today are listed in the Patient Instructions below. There are no Patient Instructions on file for this visit.   Jarrett Soho, [place]  11/02/2016 1:46 PM    [facility] [place], [place], [place]  [address] Phone: [phone]; Fax: [phone]

## 2016-11-06 ENCOUNTER — Ambulatory Visit: Payer: Medicare Other | Admitting: Physician Assistant

## 2016-11-16 DIAGNOSIS — E7800 Pure hypercholesterolemia, unspecified: Secondary | ICD-10-CM | POA: Diagnosis not present

## 2016-11-16 DIAGNOSIS — Z0000 Encounter for general adult medical examination without abnormal findings: Secondary | ICD-10-CM | POA: Diagnosis not present

## 2016-11-16 DIAGNOSIS — I10 Essential (primary) hypertension: Secondary | ICD-10-CM | POA: Diagnosis not present

## 2016-11-16 DIAGNOSIS — E559 Vitamin D deficiency, unspecified: Secondary | ICD-10-CM | POA: Diagnosis not present

## 2016-11-16 DIAGNOSIS — D649 Anemia, unspecified: Secondary | ICD-10-CM | POA: Diagnosis not present

## 2016-11-16 DIAGNOSIS — E119 Type 2 diabetes mellitus without complications: Secondary | ICD-10-CM | POA: Diagnosis not present

## 2016-11-20 ENCOUNTER — Ambulatory Visit: Payer: Medicare Other | Admitting: Oncology

## 2016-11-20 ENCOUNTER — Telehealth: Payer: Self-pay | Admitting: *Deleted

## 2016-11-20 NOTE — Telephone Encounter (Signed)
Spoke with pt's daughter, she is unable to get him to appt today. Requests to reschedule 2-3 weeks out on a Monday. Appt given for 11/19 @ 0505 with APP.

## 2016-11-21 DIAGNOSIS — I10 Essential (primary) hypertension: Secondary | ICD-10-CM | POA: Diagnosis not present

## 2016-11-21 DIAGNOSIS — I259 Chronic ischemic heart disease, unspecified: Secondary | ICD-10-CM | POA: Diagnosis not present

## 2016-11-21 DIAGNOSIS — E785 Hyperlipidemia, unspecified: Secondary | ICD-10-CM | POA: Diagnosis not present

## 2016-11-21 DIAGNOSIS — Z0001 Encounter for general adult medical examination with abnormal findings: Secondary | ICD-10-CM | POA: Diagnosis not present

## 2016-11-21 DIAGNOSIS — C61 Malignant neoplasm of prostate: Secondary | ICD-10-CM | POA: Diagnosis not present

## 2016-11-21 DIAGNOSIS — D649 Anemia, unspecified: Secondary | ICD-10-CM | POA: Diagnosis not present

## 2016-11-21 DIAGNOSIS — N4340 Spermatocele of epididymis, unspecified: Secondary | ICD-10-CM | POA: Diagnosis not present

## 2016-11-21 DIAGNOSIS — I509 Heart failure, unspecified: Secondary | ICD-10-CM | POA: Diagnosis not present

## 2016-11-21 DIAGNOSIS — E7800 Pure hypercholesterolemia, unspecified: Secondary | ICD-10-CM | POA: Diagnosis not present

## 2016-11-23 ENCOUNTER — Other Ambulatory Visit: Payer: Self-pay

## 2016-11-23 DIAGNOSIS — I6523 Occlusion and stenosis of bilateral carotid arteries: Secondary | ICD-10-CM

## 2016-11-23 DIAGNOSIS — I739 Peripheral vascular disease, unspecified: Secondary | ICD-10-CM

## 2016-12-11 ENCOUNTER — Ambulatory Visit (HOSPITAL_BASED_OUTPATIENT_CLINIC_OR_DEPARTMENT_OTHER): Payer: Medicare Other | Admitting: Nurse Practitioner

## 2016-12-11 ENCOUNTER — Ambulatory Visit (HOSPITAL_BASED_OUTPATIENT_CLINIC_OR_DEPARTMENT_OTHER): Payer: Medicare Other

## 2016-12-11 ENCOUNTER — Telehealth: Payer: Self-pay | Admitting: Nurse Practitioner

## 2016-12-11 ENCOUNTER — Encounter: Payer: Self-pay | Admitting: Nurse Practitioner

## 2016-12-11 VITALS — BP 169/46 | HR 48 | Temp 97.9°F | Resp 18 | Ht 67.0 in | Wt 162.5 lb

## 2016-12-11 DIAGNOSIS — N189 Chronic kidney disease, unspecified: Secondary | ICD-10-CM | POA: Diagnosis not present

## 2016-12-11 DIAGNOSIS — R1310 Dysphagia, unspecified: Secondary | ICD-10-CM | POA: Diagnosis not present

## 2016-12-11 DIAGNOSIS — C61 Malignant neoplasm of prostate: Secondary | ICD-10-CM

## 2016-12-11 DIAGNOSIS — J449 Chronic obstructive pulmonary disease, unspecified: Secondary | ICD-10-CM

## 2016-12-11 DIAGNOSIS — R079 Chest pain, unspecified: Secondary | ICD-10-CM | POA: Diagnosis not present

## 2016-12-11 DIAGNOSIS — R32 Unspecified urinary incontinence: Secondary | ICD-10-CM | POA: Diagnosis not present

## 2016-12-11 DIAGNOSIS — F0390 Unspecified dementia without behavioral disturbance: Secondary | ICD-10-CM | POA: Diagnosis not present

## 2016-12-11 DIAGNOSIS — R630 Anorexia: Secondary | ICD-10-CM | POA: Diagnosis not present

## 2016-12-11 DIAGNOSIS — C778 Secondary and unspecified malignant neoplasm of lymph nodes of multiple regions: Secondary | ICD-10-CM

## 2016-12-11 DIAGNOSIS — Z5111 Encounter for antineoplastic chemotherapy: Secondary | ICD-10-CM | POA: Diagnosis not present

## 2016-12-11 DIAGNOSIS — E119 Type 2 diabetes mellitus without complications: Secondary | ICD-10-CM

## 2016-12-11 DIAGNOSIS — D649 Anemia, unspecified: Secondary | ICD-10-CM

## 2016-12-11 HISTORY — DX: Malignant neoplasm of prostate: C61

## 2016-12-11 LAB — COMPREHENSIVE METABOLIC PANEL
ALBUMIN: 3.2 g/dL — AB (ref 3.5–5.0)
ALK PHOS: 77 U/L (ref 40–150)
ALT: 21 U/L (ref 0–55)
AST: 27 U/L (ref 5–34)
Anion Gap: 8 mEq/L (ref 3–11)
BUN: 90.7 mg/dL — ABNORMAL HIGH (ref 7.0–26.0)
CO2: 21 mEq/L — ABNORMAL LOW (ref 22–29)
Calcium: 10.2 mg/dL (ref 8.4–10.4)
Chloride: 109 mEq/L (ref 98–109)
Creatinine: 2.5 mg/dL — ABNORMAL HIGH (ref 0.7–1.3)
EGFR: 26 mL/min/{1.73_m2} — AB (ref >60)
GLUCOSE: 164 mg/dL — AB (ref 70–140)
POTASSIUM: 5.2 meq/L — AB (ref 3.5–5.1)
SODIUM: 138 meq/L (ref 136–145)
Total Bilirubin: 0.37 mg/dL (ref 0.20–1.20)
Total Protein: 8.3 g/dL (ref 6.4–8.3)

## 2016-12-11 LAB — CBC WITH DIFFERENTIAL/PLATELET
BASO%: 0.6 % (ref 0.0–2.0)
Basophils Absolute: 0 10*3/uL (ref 0.0–0.1)
EOS%: 1.6 % (ref 0.0–7.0)
Eosinophils Absolute: 0.1 10*3/uL (ref 0.0–0.5)
HCT: 32.3 % — ABNORMAL LOW (ref 38.4–49.9)
HGB: 10 g/dL — ABNORMAL LOW (ref 13.0–17.1)
LYMPH%: 20.3 % (ref 14.0–49.0)
MCH: 25.4 pg — AB (ref 27.2–33.4)
MCHC: 31 g/dL — ABNORMAL LOW (ref 32.0–36.0)
MCV: 82 fL (ref 79.3–98.0)
MONO#: 0.6 10*3/uL (ref 0.1–0.9)
MONO%: 10 % (ref 0.0–14.0)
NEUT#: 3.8 10*3/uL (ref 1.5–6.5)
NEUT%: 67.5 % (ref 39.0–75.0)
Platelets: 207 10*3/uL (ref 140–400)
RBC: 3.93 10*6/uL — AB (ref 4.20–5.82)
RDW: 17.2 % — AB (ref 11.0–14.6)
WBC: 5.6 10*3/uL (ref 4.0–10.3)
lymph#: 1.1 10*3/uL (ref 0.9–3.3)

## 2016-12-11 MED ORDER — LEUPROLIDE ACETATE (3 MONTH) 22.5 MG IM KIT
22.5000 mg | PACK | Freq: Once | INTRAMUSCULAR | Status: AC
  Administered 2016-12-11: 22.5 mg via INTRAMUSCULAR
  Filled 2016-12-11: qty 22.5

## 2016-12-11 NOTE — Progress Notes (Signed)
  [facility] OFFICE PROGRESS NOTE   Diagnosis: Prostate cancer  INTERVAL HISTORY:   Colin [name] returns for follow-up.  He initially denied pain.  Later in the visit he reported occasional chest pain. He has periodic dysphagia.  He has a poor appetite.  He continues to have urinary incontinence.  This is a chronic problem.  No significant hot flashes.  His daughter reports they missed the last appointment for a Lupron injection.  Objective:  Vital signs in last 24 hours:  Blood pressure (!) 169/46, pulse (!) 48, temperature 97.9 F (36.6 C), temperature source Oral, resp. rate 18, height 5\' 7"  (1.702 m), weight 162 lb 8 oz (73.7 kg), SpO2 100 %.    HEENT: No thrush or ulcers. Resp: Lungs clear bilaterally. Cardio: Regular rate and rhythm. GI: Abdomen soft and nontender.  No hepatomegaly. Vascular: No leg edema.  Lab Results:  Lab Results  Component Value Date   WBC 5.8 08/03/2016   HGB 10.4 (L) 08/03/2016   HCT 34.3 (L) 08/03/2016   MCV 83 08/03/2016   PLT 256 08/03/2016   NEUTROABS 3.8 08/03/2016    Imaging:  No results found.  Medications: I have reviewed the patient's current medications.  Assessment/Plan: 1. Prostate cancer-metastatic, hormone sensitive ? Initiation of Lupron/case and Casodex 2018, followed by every Lupron-six-month dose given 03/27/2016 ? CT abdomen/pelvis 02/29/2016-enlarged prostate, extensive abdominal/retroperitoneal/pelvic adenopathy ? 03/06/2016 PSA 1590 ? Bone scan 03/14/2016-negative for metastatic disease ? 07/03/2016 PSA 1.96 ? 91-month Lupron given 12/11/2016 2.   History of coronary artery disease 3.   Diabetes 4.   Peripheral vascular disease 5.   Anemia 6.   Chronic renal failure 7.   COPD 8.   Dementia  Disposition: Colin [name] appears unchanged.  The most recent PSA from June was markedly improved.  He reports missing his Lupron injection in September.  He would like to receive the injections at our office.  He will  receive an every 3 month Lupron injection today.  We will have him return to the lab for a PSA, CBC and chemistry panel.  He will return for lab, follow-up and Lupron in 3 months.  He has follow-up with Dr. Shelia Media tomorrow and will discuss the chest pain and dysphagia.  Patient seen with Dr. Benay Spice.    Ned Card ANP/GNP-BC   12/11/2016  1:09 PM  This was a shared visit with Ned Card.  Colin [name] has metastatic prostate cancer.  He has not seen Dr. Rosana Hoes for a follow-up Lupron injection.  We obtained a baseline PSA and administered Lupron today.  He will return for an office visit and Lupron therapy in 3 months.  Julieanne Manson, MD

## 2016-12-11 NOTE — Telephone Encounter (Signed)
Scheduled appt per 11/19 los - Gave patient AVS and calender per los.  

## 2016-12-12 LAB — PSA

## 2016-12-20 DIAGNOSIS — E785 Hyperlipidemia, unspecified: Secondary | ICD-10-CM | POA: Diagnosis not present

## 2016-12-20 DIAGNOSIS — C61 Malignant neoplasm of prostate: Secondary | ICD-10-CM | POA: Diagnosis not present

## 2016-12-20 DIAGNOSIS — I509 Heart failure, unspecified: Secondary | ICD-10-CM | POA: Diagnosis not present

## 2016-12-20 DIAGNOSIS — I10 Essential (primary) hypertension: Secondary | ICD-10-CM | POA: Diagnosis not present

## 2016-12-20 DIAGNOSIS — I259 Chronic ischemic heart disease, unspecified: Secondary | ICD-10-CM | POA: Diagnosis not present

## 2017-01-16 ENCOUNTER — Other Ambulatory Visit: Payer: Self-pay | Admitting: Cardiovascular Disease

## 2017-01-30 ENCOUNTER — Encounter (HOSPITAL_COMMUNITY): Payer: Medicare Other

## 2017-01-30 ENCOUNTER — Ambulatory Visit: Payer: Medicare Other | Admitting: Vascular Surgery

## 2017-03-05 ENCOUNTER — Inpatient Hospital Stay: Payer: Medicare Other | Attending: Oncology | Admitting: Oncology

## 2017-03-05 ENCOUNTER — Inpatient Hospital Stay: Payer: Medicare Other

## 2017-03-05 ENCOUNTER — Telehealth: Payer: Self-pay

## 2017-03-05 VITALS — BP 151/80 | HR 62 | Temp 97.8°F | Resp 18 | Ht 67.0 in | Wt 165.3 lb

## 2017-03-05 DIAGNOSIS — R599 Enlarged lymph nodes, unspecified: Secondary | ICD-10-CM | POA: Insufficient documentation

## 2017-03-05 DIAGNOSIS — I129 Hypertensive chronic kidney disease with stage 1 through stage 4 chronic kidney disease, or unspecified chronic kidney disease: Secondary | ICD-10-CM | POA: Insufficient documentation

## 2017-03-05 DIAGNOSIS — E875 Hyperkalemia: Secondary | ICD-10-CM | POA: Diagnosis not present

## 2017-03-05 DIAGNOSIS — C61 Malignant neoplasm of prostate: Secondary | ICD-10-CM | POA: Diagnosis not present

## 2017-03-05 DIAGNOSIS — I2510 Atherosclerotic heart disease of native coronary artery without angina pectoris: Secondary | ICD-10-CM | POA: Insufficient documentation

## 2017-03-05 DIAGNOSIS — N189 Chronic kidney disease, unspecified: Secondary | ICD-10-CM | POA: Diagnosis not present

## 2017-03-05 DIAGNOSIS — F0390 Unspecified dementia without behavioral disturbance: Secondary | ICD-10-CM | POA: Diagnosis not present

## 2017-03-05 DIAGNOSIS — R351 Nocturia: Secondary | ICD-10-CM | POA: Diagnosis not present

## 2017-03-05 DIAGNOSIS — I739 Peripheral vascular disease, unspecified: Secondary | ICD-10-CM | POA: Diagnosis not present

## 2017-03-05 DIAGNOSIS — D649 Anemia, unspecified: Secondary | ICD-10-CM | POA: Diagnosis not present

## 2017-03-05 DIAGNOSIS — E1122 Type 2 diabetes mellitus with diabetic chronic kidney disease: Secondary | ICD-10-CM | POA: Insufficient documentation

## 2017-03-05 DIAGNOSIS — Z79818 Long term (current) use of other agents affecting estrogen receptors and estrogen levels: Secondary | ICD-10-CM | POA: Diagnosis not present

## 2017-03-05 DIAGNOSIS — J449 Chronic obstructive pulmonary disease, unspecified: Secondary | ICD-10-CM | POA: Diagnosis not present

## 2017-03-05 DIAGNOSIS — R944 Abnormal results of kidney function studies: Secondary | ICD-10-CM | POA: Insufficient documentation

## 2017-03-05 LAB — CBC WITH DIFFERENTIAL/PLATELET
Basophils Absolute: 0 10*3/uL (ref 0.0–0.1)
Basophils Relative: 1 %
EOS ABS: 0.2 10*3/uL (ref 0.0–0.5)
Eosinophils Relative: 4 %
HCT: 32.2 % — ABNORMAL LOW (ref 38.4–49.9)
HEMOGLOBIN: 10.1 g/dL — AB (ref 13.0–17.1)
LYMPHS ABS: 0.8 10*3/uL — AB (ref 0.9–3.3)
Lymphocytes Relative: 17 %
MCH: 25.7 pg — AB (ref 27.2–33.4)
MCHC: 31.2 g/dL — ABNORMAL LOW (ref 32.0–36.0)
MCV: 82.2 fL (ref 79.3–98.0)
Monocytes Absolute: 0.4 10*3/uL (ref 0.1–0.9)
Monocytes Relative: 9 %
NEUTROS PCT: 69 %
Neutro Abs: 3.5 10*3/uL (ref 1.5–6.5)
Platelets: 217 10*3/uL (ref 140–400)
RBC: 3.92 MIL/uL — AB (ref 4.20–5.82)
RDW: 17.2 % — ABNORMAL HIGH (ref 11.0–14.6)
WBC: 5 10*3/uL (ref 4.0–10.3)

## 2017-03-05 LAB — COMPREHENSIVE METABOLIC PANEL
ALT: 16 U/L (ref 0–55)
AST: 22 U/L (ref 5–34)
Albumin: 3.3 g/dL — ABNORMAL LOW (ref 3.5–5.0)
Alkaline Phosphatase: 91 U/L (ref 40–150)
Anion gap: 7 (ref 3–11)
BUN: 92 mg/dL — ABNORMAL HIGH (ref 7–26)
CHLORIDE: 112 mmol/L — AB (ref 98–109)
CO2: 19 mmol/L — ABNORMAL LOW (ref 22–29)
Calcium: 9.8 mg/dL (ref 8.4–10.4)
Creatinine, Ser: 2.53 mg/dL — ABNORMAL HIGH (ref 0.70–1.30)
GFR calc Af Amer: 25 mL/min — ABNORMAL LOW (ref >60)
GFR, EST NON AFRICAN AMERICAN: 21 mL/min — AB (ref >60)
Glucose, Bld: 190 mg/dL — ABNORMAL HIGH (ref 70–140)
POTASSIUM: 5.7 mmol/L — AB (ref 3.5–5.1)
Sodium: 138 mmol/L (ref 136–145)
Total Bilirubin: 0.3 mg/dL (ref 0.2–1.2)
Total Protein: 7.9 g/dL (ref 6.4–8.3)

## 2017-03-05 MED ORDER — LEUPROLIDE ACETATE (3 MONTH) 22.5 MG IM KIT
22.5000 mg | PACK | Freq: Once | INTRAMUSCULAR | Status: AC
  Administered 2017-03-05: 22.5 mg via INTRAMUSCULAR

## 2017-03-05 NOTE — Progress Notes (Signed)
  [place] OFFICE PROGRESS NOTE   Diagnosis: Prostate cancer  INTERVAL HISTORY:   Colin [name] returns as scheduled.  He reports a good appetite.  He has nocturia.  He takes Lasix in the evening.  No other specific complaint.  Objective:  Vital signs in last 24 hours:  Blood pressure (!) 151/80, pulse 62, temperature 97.8 F (36.6 C), temperature source Oral, resp. rate 18, SpO2 100 %.    HEENT: Neck without mass Lymphatics: No inguinal nodes Resp: Distant breath sounds, no respiratory distress  Cardio: Regular rate and rhythm GI: No hepatosplenomegaly, no mass, nontender Vascular: No leg edema      Lab Results:  Lab Results  Component Value Date   WBC 5.0 03/05/2017   HGB 10.1 (L) 03/05/2017   HCT 32.2 (L) 03/05/2017   MCV 82.2 03/05/2017   PLT 217 03/05/2017   NEUTROABS 3.5 03/05/2017    CMP     Component Value Date/Time   NA 138 03/05/2017 1053   NA 138 12/11/2016 1457   K 5.7 (H) 03/05/2017 1053   K 5.2 (H) 12/11/2016 1457   CL 112 (H) 03/05/2017 1053   CO2 19 (L) 03/05/2017 1053   CO2 21 (L) 12/11/2016 1457   GLUCOSE 190 (H) 03/05/2017 1053   GLUCOSE 164 (H) 12/11/2016 1457   BUN 92 (H) 03/05/2017 1053   BUN 90.7 (H) 12/11/2016 1457   CREATININE 2.53 (H) 03/05/2017 1053   CREATININE 2.5 (H) 12/11/2016 1457   CALCIUM 9.8 03/05/2017 1053   CALCIUM 10.2 12/11/2016 1457   PROT 7.9 03/05/2017 1053   PROT 8.3 12/11/2016 1457   ALBUMIN 3.3 (L) 03/05/2017 1053   ALBUMIN 3.2 (L) 12/11/2016 1457   AST 22 03/05/2017 1053   AST 27 12/11/2016 1457   ALT 16 03/05/2017 1053   ALT 21 12/11/2016 1457   ALKPHOS 91 03/05/2017 1053   ALKPHOS 77 12/11/2016 1457   BILITOT 0.3 03/05/2017 1053   BILITOT 0.37 12/11/2016 1457   GFRNONAA 21 (L) 03/05/2017 1053   GFRAA 25 (L) 03/05/2017 1053   PSA on 12/11/2017: Less than 0.1  Medications: I have reviewed the patient's current medications.   Assessment/Plan: 1. Prostate cancer-metastatic, hormone  sensitive ? Initiation of Lupron/case and Casodex 2018, followed by every Lupron-six-month dose given 03/27/2016 ? CT abdomen/pelvis 02/29/2016-enlarged prostate, extensive abdominal/retroperitoneal/pelvic adenopathy ? 03/06/2016 PSA 1590 ? Bone scan 03/14/2016-negative for metastatic disease ? 07/03/2016 PSA 1.96 ? 43-month Lupron given 12/11/2016, 03/05/2017 2.History of coronary artery disease 3.Diabetes 4.Peripheral vascular disease 5.Anemia-likely secondary to renal insufficiency and metastatic prostate cancer 6.Chronic renal failure 7.COPD 8.Dementia   Disposition: Colin [name] will continue every 30-month Lupron for treatment of metastatic prostate cancer.  He appears asymptomatic from prostate cancer unless the nocturia is related to prostatic hypertrophy.  He will try taking the Lasix earlier in the day.  The creatinine remains elevated and he has hyperkalemia today.  We will contact the nephrology office for recommendations regarding management of the hyperkalemia/renal failure.  Colin [name] will receive a Lupron injection today.  We will follow-up on the PSA from today.  He will return for an office visit in 3 months.  15 minutes were spent with the patient today.  The majority of the time was used for counseling and coordination of care.  Betsy Coder, MD  03/05/2017  11:52 AM

## 2017-03-05 NOTE — Telephone Encounter (Signed)
Spoke with [facility] from Dr. Jason Nest office. Alerted their office of today's potassium level. Updated reconciled med list with him via phone. Matt also states that he has received the labs via Epic and will consult with Dr. Justin Mend. This RN voiced understanding.

## 2017-03-05 NOTE — Patient Instructions (Signed)
Leuprolide depot injection What is this medicine? LEUPROLIDE (loo PROE lide) is a man-made protein that acts like a natural hormone in the body. It decreases testosterone in men and decreases estrogen in women. In men, this medicine is used to treat advanced prostate cancer. In women, some forms of this medicine may be used to treat endometriosis, uterine fibroids, or other male hormone-related problems. This medicine may be used for other purposes; ask your health care provider or pharmacist if you have questions. COMMON BRAND NAME(S): Eligard, Lupron Depot, Lupron Depot-Ped, Viadur What should I tell my health care provider before I take this medicine? They need to know if you have any of these conditions: -diabetes -heart disease or previous heart attack -high blood pressure -high cholesterol -mental illness -osteoporosis -pain or difficulty passing urine -seizures -spinal cord metastasis -stroke -suicidal thoughts, plans, or attempt; a previous suicide attempt by you or a family member -tobacco smoker -unusual vaginal bleeding (women) -an unusual or allergic reaction to leuprolide, benzyl alcohol, other medicines, foods, dyes, or preservatives -pregnant or trying to get pregnant -breast-feeding How should I use this medicine? This medicine is for injection into a muscle or for injection under the skin. It is given by a health care professional in a hospital or clinic setting. The specific product will determine how it will be given to you. Make sure you understand which product you receive and how often you will receive it. Talk to your pediatrician regarding the use of this medicine in children. Special care may be needed. Overdosage: If you think you have taken too much of this medicine contact a poison control center or emergency room at once. NOTE: This medicine is only for you. Do not share this medicine with others. What if I miss a dose? It is important not to miss a dose.  Call your doctor or health care professional if you are unable to keep an appointment. Depot injections: Depot injections are given either once-monthly, every 12 weeks, every 16 weeks, or every 24 weeks depending on the product you are prescribed. The product you are prescribed will be based on if you are male or male, and your condition. Make sure you understand your product and dosing. What may interact with this medicine? Do not take this medicine with any of the following medications: -chasteberry This medicine may also interact with the following medications: -herbal or dietary supplements, like black cohosh or DHEA -male hormones, like estrogens or progestins and birth control pills, patches, rings, or injections -male hormones, like testosterone This list may not describe all possible interactions. Give your health care provider a list of all the medicines, herbs, non-prescription drugs, or dietary supplements you use. Also tell them if you smoke, drink alcohol, or use illegal drugs. Some items may interact with your medicine. What should I watch for while using this medicine? Visit your doctor or health care professional for regular checks on your progress. During the first weeks of treatment, your symptoms may get worse, but then will improve as you continue your treatment. You may get hot flashes, increased bone pain, increased difficulty passing urine, or an aggravation of nerve symptoms. Discuss these effects with your doctor or health care professional, some of them may improve with continued use of this medicine. Male patients may experience a menstrual cycle or spotting during the first months of therapy with this medicine. If this continues, contact your doctor or health care professional. What side effects may I notice from receiving this medicine? Side   effects that you should report to your doctor or health care professional as soon as possible: -allergic reactions like skin  rash, itching or hives, swelling of the face, lips, or tongue -breathing problems -chest pain -depression or memory disorders -pain in your legs or groin -pain at site where injected or implanted -seizures -severe headache -swelling of the feet and legs -suicidal thoughts or other mood changes -visual changes -vomiting Side effects that usually do not require medical attention (report to your doctor or health care professional if they continue or are bothersome): -breast swelling or tenderness -decrease in sex drive or performance -diarrhea -hot flashes -loss of appetite -muscle, joint, or bone pains -nausea -redness or irritation at site where injected or implanted -skin problems or acne This list may not describe all possible side effects. Call your doctor for medical advice about side effects. You may report side effects to FDA at 1-800-FDA-1088. Where should I keep my medicine? This drug is given in a hospital or clinic and will not be stored at home. NOTE: This sheet is a summary. It may not cover all possible information. If you have questions about this medicine, talk to your doctor, pharmacist, or health care provider.  2018 Elsevier/Gold Standard (2015-06-24 09:45:53)  

## 2017-03-05 NOTE — Telephone Encounter (Signed)
Printed avs and calender for upcoming appointment. Per 2/11 los 

## 2017-03-06 LAB — PROSTATE-SPECIFIC AG, SERUM (LABCORP): Prostate Specific Ag, Serum: <0.1 ng/mL (ref 0.0–4.0)

## 2017-03-15 DIAGNOSIS — E1121 Type 2 diabetes mellitus with diabetic nephropathy: Secondary | ICD-10-CM | POA: Diagnosis not present

## 2017-03-27 ENCOUNTER — Encounter (HOSPITAL_COMMUNITY): Payer: Medicare Other

## 2017-03-27 ENCOUNTER — Ambulatory Visit: Payer: Medicare Other | Admitting: Vascular Surgery

## 2017-04-03 DIAGNOSIS — I10 Essential (primary) hypertension: Secondary | ICD-10-CM | POA: Diagnosis not present

## 2017-04-03 DIAGNOSIS — E785 Hyperlipidemia, unspecified: Secondary | ICD-10-CM | POA: Diagnosis not present

## 2017-04-03 DIAGNOSIS — I509 Heart failure, unspecified: Secondary | ICD-10-CM | POA: Diagnosis not present

## 2017-04-03 DIAGNOSIS — D649 Anemia, unspecified: Secondary | ICD-10-CM | POA: Diagnosis not present

## 2017-04-03 DIAGNOSIS — I259 Chronic ischemic heart disease, unspecified: Secondary | ICD-10-CM | POA: Diagnosis not present

## 2017-04-03 DIAGNOSIS — C61 Malignant neoplasm of prostate: Secondary | ICD-10-CM | POA: Diagnosis not present

## 2017-04-03 DIAGNOSIS — E538 Deficiency of other specified B group vitamins: Secondary | ICD-10-CM | POA: Diagnosis not present

## 2017-04-04 DIAGNOSIS — M25512 Pain in left shoulder: Secondary | ICD-10-CM | POA: Diagnosis not present

## 2017-04-10 DIAGNOSIS — R531 Weakness: Secondary | ICD-10-CM | POA: Diagnosis not present

## 2017-04-19 DIAGNOSIS — N184 Chronic kidney disease, stage 4 (severe): Secondary | ICD-10-CM | POA: Diagnosis not present

## 2017-04-19 DIAGNOSIS — D509 Iron deficiency anemia, unspecified: Secondary | ICD-10-CM | POA: Diagnosis not present

## 2017-04-19 DIAGNOSIS — I2510 Atherosclerotic heart disease of native coronary artery without angina pectoris: Secondary | ICD-10-CM | POA: Diagnosis not present

## 2017-04-19 DIAGNOSIS — J449 Chronic obstructive pulmonary disease, unspecified: Secondary | ICD-10-CM | POA: Diagnosis not present

## 2017-04-19 DIAGNOSIS — I130 Hypertensive heart and chronic kidney disease with heart failure and stage 1 through stage 4 chronic kidney disease, or unspecified chronic kidney disease: Secondary | ICD-10-CM | POA: Diagnosis not present

## 2017-04-19 DIAGNOSIS — D631 Anemia in chronic kidney disease: Secondary | ICD-10-CM | POA: Diagnosis not present

## 2017-04-19 DIAGNOSIS — E1151 Type 2 diabetes mellitus with diabetic peripheral angiopathy without gangrene: Secondary | ICD-10-CM | POA: Diagnosis not present

## 2017-04-19 DIAGNOSIS — E1142 Type 2 diabetes mellitus with diabetic polyneuropathy: Secondary | ICD-10-CM | POA: Diagnosis not present

## 2017-04-19 DIAGNOSIS — E1122 Type 2 diabetes mellitus with diabetic chronic kidney disease: Secondary | ICD-10-CM | POA: Diagnosis not present

## 2017-04-19 DIAGNOSIS — I509 Heart failure, unspecified: Secondary | ICD-10-CM | POA: Diagnosis not present

## 2017-04-19 DIAGNOSIS — I495 Sick sinus syndrome: Secondary | ICD-10-CM | POA: Diagnosis not present

## 2017-04-19 DIAGNOSIS — I259 Chronic ischemic heart disease, unspecified: Secondary | ICD-10-CM | POA: Diagnosis not present

## 2017-04-20 DIAGNOSIS — E1151 Type 2 diabetes mellitus with diabetic peripheral angiopathy without gangrene: Secondary | ICD-10-CM | POA: Diagnosis not present

## 2017-04-20 DIAGNOSIS — I495 Sick sinus syndrome: Secondary | ICD-10-CM | POA: Diagnosis not present

## 2017-04-20 DIAGNOSIS — D631 Anemia in chronic kidney disease: Secondary | ICD-10-CM | POA: Diagnosis not present

## 2017-04-20 DIAGNOSIS — E1142 Type 2 diabetes mellitus with diabetic polyneuropathy: Secondary | ICD-10-CM | POA: Diagnosis not present

## 2017-04-20 DIAGNOSIS — I130 Hypertensive heart and chronic kidney disease with heart failure and stage 1 through stage 4 chronic kidney disease, or unspecified chronic kidney disease: Secondary | ICD-10-CM | POA: Diagnosis not present

## 2017-04-20 DIAGNOSIS — I2510 Atherosclerotic heart disease of native coronary artery without angina pectoris: Secondary | ICD-10-CM | POA: Diagnosis not present

## 2017-04-20 DIAGNOSIS — N184 Chronic kidney disease, stage 4 (severe): Secondary | ICD-10-CM | POA: Diagnosis not present

## 2017-04-20 DIAGNOSIS — E1122 Type 2 diabetes mellitus with diabetic chronic kidney disease: Secondary | ICD-10-CM | POA: Diagnosis not present

## 2017-04-20 DIAGNOSIS — I259 Chronic ischemic heart disease, unspecified: Secondary | ICD-10-CM | POA: Diagnosis not present

## 2017-04-20 DIAGNOSIS — I509 Heart failure, unspecified: Secondary | ICD-10-CM | POA: Diagnosis not present

## 2017-04-20 DIAGNOSIS — D509 Iron deficiency anemia, unspecified: Secondary | ICD-10-CM | POA: Diagnosis not present

## 2017-04-20 DIAGNOSIS — J449 Chronic obstructive pulmonary disease, unspecified: Secondary | ICD-10-CM | POA: Diagnosis not present

## 2017-04-23 DIAGNOSIS — I130 Hypertensive heart and chronic kidney disease with heart failure and stage 1 through stage 4 chronic kidney disease, or unspecified chronic kidney disease: Secondary | ICD-10-CM | POA: Diagnosis not present

## 2017-04-23 DIAGNOSIS — J449 Chronic obstructive pulmonary disease, unspecified: Secondary | ICD-10-CM | POA: Diagnosis not present

## 2017-04-23 DIAGNOSIS — I495 Sick sinus syndrome: Secondary | ICD-10-CM | POA: Diagnosis not present

## 2017-04-23 DIAGNOSIS — E1151 Type 2 diabetes mellitus with diabetic peripheral angiopathy without gangrene: Secondary | ICD-10-CM | POA: Diagnosis not present

## 2017-04-23 DIAGNOSIS — E1142 Type 2 diabetes mellitus with diabetic polyneuropathy: Secondary | ICD-10-CM | POA: Diagnosis not present

## 2017-04-23 DIAGNOSIS — I259 Chronic ischemic heart disease, unspecified: Secondary | ICD-10-CM | POA: Diagnosis not present

## 2017-04-23 DIAGNOSIS — E1122 Type 2 diabetes mellitus with diabetic chronic kidney disease: Secondary | ICD-10-CM | POA: Diagnosis not present

## 2017-04-23 DIAGNOSIS — I2510 Atherosclerotic heart disease of native coronary artery without angina pectoris: Secondary | ICD-10-CM | POA: Diagnosis not present

## 2017-04-23 DIAGNOSIS — N184 Chronic kidney disease, stage 4 (severe): Secondary | ICD-10-CM | POA: Diagnosis not present

## 2017-04-23 DIAGNOSIS — D509 Iron deficiency anemia, unspecified: Secondary | ICD-10-CM | POA: Diagnosis not present

## 2017-04-23 DIAGNOSIS — I509 Heart failure, unspecified: Secondary | ICD-10-CM | POA: Diagnosis not present

## 2017-04-23 DIAGNOSIS — D631 Anemia in chronic kidney disease: Secondary | ICD-10-CM | POA: Diagnosis not present

## 2017-04-24 DIAGNOSIS — D509 Iron deficiency anemia, unspecified: Secondary | ICD-10-CM | POA: Diagnosis not present

## 2017-04-24 DIAGNOSIS — N184 Chronic kidney disease, stage 4 (severe): Secondary | ICD-10-CM | POA: Diagnosis not present

## 2017-04-24 DIAGNOSIS — I130 Hypertensive heart and chronic kidney disease with heart failure and stage 1 through stage 4 chronic kidney disease, or unspecified chronic kidney disease: Secondary | ICD-10-CM | POA: Diagnosis not present

## 2017-04-24 DIAGNOSIS — I495 Sick sinus syndrome: Secondary | ICD-10-CM | POA: Diagnosis not present

## 2017-04-24 DIAGNOSIS — E1122 Type 2 diabetes mellitus with diabetic chronic kidney disease: Secondary | ICD-10-CM | POA: Diagnosis not present

## 2017-04-24 DIAGNOSIS — I509 Heart failure, unspecified: Secondary | ICD-10-CM | POA: Diagnosis not present

## 2017-04-24 DIAGNOSIS — J449 Chronic obstructive pulmonary disease, unspecified: Secondary | ICD-10-CM | POA: Diagnosis not present

## 2017-04-24 DIAGNOSIS — D631 Anemia in chronic kidney disease: Secondary | ICD-10-CM | POA: Diagnosis not present

## 2017-04-24 DIAGNOSIS — I2510 Atherosclerotic heart disease of native coronary artery without angina pectoris: Secondary | ICD-10-CM | POA: Diagnosis not present

## 2017-04-24 DIAGNOSIS — I259 Chronic ischemic heart disease, unspecified: Secondary | ICD-10-CM | POA: Diagnosis not present

## 2017-04-24 DIAGNOSIS — E1151 Type 2 diabetes mellitus with diabetic peripheral angiopathy without gangrene: Secondary | ICD-10-CM | POA: Diagnosis not present

## 2017-04-24 DIAGNOSIS — E1142 Type 2 diabetes mellitus with diabetic polyneuropathy: Secondary | ICD-10-CM | POA: Diagnosis not present

## 2017-04-25 DIAGNOSIS — J449 Chronic obstructive pulmonary disease, unspecified: Secondary | ICD-10-CM | POA: Diagnosis not present

## 2017-04-25 DIAGNOSIS — E1151 Type 2 diabetes mellitus with diabetic peripheral angiopathy without gangrene: Secondary | ICD-10-CM | POA: Diagnosis not present

## 2017-04-25 DIAGNOSIS — I509 Heart failure, unspecified: Secondary | ICD-10-CM | POA: Diagnosis not present

## 2017-04-25 DIAGNOSIS — D509 Iron deficiency anemia, unspecified: Secondary | ICD-10-CM | POA: Diagnosis not present

## 2017-04-25 DIAGNOSIS — E1142 Type 2 diabetes mellitus with diabetic polyneuropathy: Secondary | ICD-10-CM | POA: Diagnosis not present

## 2017-04-25 DIAGNOSIS — I259 Chronic ischemic heart disease, unspecified: Secondary | ICD-10-CM | POA: Diagnosis not present

## 2017-04-25 DIAGNOSIS — D631 Anemia in chronic kidney disease: Secondary | ICD-10-CM | POA: Diagnosis not present

## 2017-04-25 DIAGNOSIS — N184 Chronic kidney disease, stage 4 (severe): Secondary | ICD-10-CM | POA: Diagnosis not present

## 2017-04-25 DIAGNOSIS — E1122 Type 2 diabetes mellitus with diabetic chronic kidney disease: Secondary | ICD-10-CM | POA: Diagnosis not present

## 2017-04-25 DIAGNOSIS — I130 Hypertensive heart and chronic kidney disease with heart failure and stage 1 through stage 4 chronic kidney disease, or unspecified chronic kidney disease: Secondary | ICD-10-CM | POA: Diagnosis not present

## 2017-04-25 DIAGNOSIS — I495 Sick sinus syndrome: Secondary | ICD-10-CM | POA: Diagnosis not present

## 2017-04-25 DIAGNOSIS — I2510 Atherosclerotic heart disease of native coronary artery without angina pectoris: Secondary | ICD-10-CM | POA: Diagnosis not present

## 2017-04-26 DIAGNOSIS — I509 Heart failure, unspecified: Secondary | ICD-10-CM | POA: Diagnosis not present

## 2017-04-26 DIAGNOSIS — D631 Anemia in chronic kidney disease: Secondary | ICD-10-CM | POA: Diagnosis not present

## 2017-04-26 DIAGNOSIS — E1122 Type 2 diabetes mellitus with diabetic chronic kidney disease: Secondary | ICD-10-CM | POA: Diagnosis not present

## 2017-04-26 DIAGNOSIS — I495 Sick sinus syndrome: Secondary | ICD-10-CM | POA: Diagnosis not present

## 2017-04-26 DIAGNOSIS — N184 Chronic kidney disease, stage 4 (severe): Secondary | ICD-10-CM | POA: Diagnosis not present

## 2017-04-26 DIAGNOSIS — E1142 Type 2 diabetes mellitus with diabetic polyneuropathy: Secondary | ICD-10-CM | POA: Diagnosis not present

## 2017-04-26 DIAGNOSIS — E1151 Type 2 diabetes mellitus with diabetic peripheral angiopathy without gangrene: Secondary | ICD-10-CM | POA: Diagnosis not present

## 2017-04-26 DIAGNOSIS — I130 Hypertensive heart and chronic kidney disease with heart failure and stage 1 through stage 4 chronic kidney disease, or unspecified chronic kidney disease: Secondary | ICD-10-CM | POA: Diagnosis not present

## 2017-04-26 DIAGNOSIS — J449 Chronic obstructive pulmonary disease, unspecified: Secondary | ICD-10-CM | POA: Diagnosis not present

## 2017-04-26 DIAGNOSIS — I2510 Atherosclerotic heart disease of native coronary artery without angina pectoris: Secondary | ICD-10-CM | POA: Diagnosis not present

## 2017-04-26 DIAGNOSIS — D509 Iron deficiency anemia, unspecified: Secondary | ICD-10-CM | POA: Diagnosis not present

## 2017-04-26 DIAGNOSIS — I259 Chronic ischemic heart disease, unspecified: Secondary | ICD-10-CM | POA: Diagnosis not present

## 2017-04-27 DIAGNOSIS — N184 Chronic kidney disease, stage 4 (severe): Secondary | ICD-10-CM | POA: Diagnosis not present

## 2017-04-27 DIAGNOSIS — E1122 Type 2 diabetes mellitus with diabetic chronic kidney disease: Secondary | ICD-10-CM | POA: Diagnosis not present

## 2017-04-27 DIAGNOSIS — J449 Chronic obstructive pulmonary disease, unspecified: Secondary | ICD-10-CM | POA: Diagnosis not present

## 2017-04-27 DIAGNOSIS — I2510 Atherosclerotic heart disease of native coronary artery without angina pectoris: Secondary | ICD-10-CM | POA: Diagnosis not present

## 2017-04-27 DIAGNOSIS — D509 Iron deficiency anemia, unspecified: Secondary | ICD-10-CM | POA: Diagnosis not present

## 2017-04-27 DIAGNOSIS — I259 Chronic ischemic heart disease, unspecified: Secondary | ICD-10-CM | POA: Diagnosis not present

## 2017-04-27 DIAGNOSIS — E1151 Type 2 diabetes mellitus with diabetic peripheral angiopathy without gangrene: Secondary | ICD-10-CM | POA: Diagnosis not present

## 2017-04-27 DIAGNOSIS — I509 Heart failure, unspecified: Secondary | ICD-10-CM | POA: Diagnosis not present

## 2017-04-27 DIAGNOSIS — I495 Sick sinus syndrome: Secondary | ICD-10-CM | POA: Diagnosis not present

## 2017-04-27 DIAGNOSIS — E1142 Type 2 diabetes mellitus with diabetic polyneuropathy: Secondary | ICD-10-CM | POA: Diagnosis not present

## 2017-04-27 DIAGNOSIS — I130 Hypertensive heart and chronic kidney disease with heart failure and stage 1 through stage 4 chronic kidney disease, or unspecified chronic kidney disease: Secondary | ICD-10-CM | POA: Diagnosis not present

## 2017-04-27 DIAGNOSIS — D631 Anemia in chronic kidney disease: Secondary | ICD-10-CM | POA: Diagnosis not present

## 2017-04-30 DIAGNOSIS — D509 Iron deficiency anemia, unspecified: Secondary | ICD-10-CM | POA: Diagnosis not present

## 2017-04-30 DIAGNOSIS — D631 Anemia in chronic kidney disease: Secondary | ICD-10-CM | POA: Diagnosis not present

## 2017-04-30 DIAGNOSIS — I259 Chronic ischemic heart disease, unspecified: Secondary | ICD-10-CM | POA: Diagnosis not present

## 2017-04-30 DIAGNOSIS — E1142 Type 2 diabetes mellitus with diabetic polyneuropathy: Secondary | ICD-10-CM | POA: Diagnosis not present

## 2017-04-30 DIAGNOSIS — E1151 Type 2 diabetes mellitus with diabetic peripheral angiopathy without gangrene: Secondary | ICD-10-CM | POA: Diagnosis not present

## 2017-04-30 DIAGNOSIS — I495 Sick sinus syndrome: Secondary | ICD-10-CM | POA: Diagnosis not present

## 2017-04-30 DIAGNOSIS — I2510 Atherosclerotic heart disease of native coronary artery without angina pectoris: Secondary | ICD-10-CM | POA: Diagnosis not present

## 2017-04-30 DIAGNOSIS — I509 Heart failure, unspecified: Secondary | ICD-10-CM | POA: Diagnosis not present

## 2017-04-30 DIAGNOSIS — E1122 Type 2 diabetes mellitus with diabetic chronic kidney disease: Secondary | ICD-10-CM | POA: Diagnosis not present

## 2017-04-30 DIAGNOSIS — N184 Chronic kidney disease, stage 4 (severe): Secondary | ICD-10-CM | POA: Diagnosis not present

## 2017-04-30 DIAGNOSIS — J449 Chronic obstructive pulmonary disease, unspecified: Secondary | ICD-10-CM | POA: Diagnosis not present

## 2017-04-30 DIAGNOSIS — I130 Hypertensive heart and chronic kidney disease with heart failure and stage 1 through stage 4 chronic kidney disease, or unspecified chronic kidney disease: Secondary | ICD-10-CM | POA: Diagnosis not present

## 2017-05-01 DIAGNOSIS — I2510 Atherosclerotic heart disease of native coronary artery without angina pectoris: Secondary | ICD-10-CM | POA: Diagnosis not present

## 2017-05-01 DIAGNOSIS — J449 Chronic obstructive pulmonary disease, unspecified: Secondary | ICD-10-CM | POA: Diagnosis not present

## 2017-05-01 DIAGNOSIS — I495 Sick sinus syndrome: Secondary | ICD-10-CM | POA: Diagnosis not present

## 2017-05-01 DIAGNOSIS — I259 Chronic ischemic heart disease, unspecified: Secondary | ICD-10-CM | POA: Diagnosis not present

## 2017-05-01 DIAGNOSIS — D509 Iron deficiency anemia, unspecified: Secondary | ICD-10-CM | POA: Diagnosis not present

## 2017-05-01 DIAGNOSIS — E1151 Type 2 diabetes mellitus with diabetic peripheral angiopathy without gangrene: Secondary | ICD-10-CM | POA: Diagnosis not present

## 2017-05-01 DIAGNOSIS — I509 Heart failure, unspecified: Secondary | ICD-10-CM | POA: Diagnosis not present

## 2017-05-01 DIAGNOSIS — E1122 Type 2 diabetes mellitus with diabetic chronic kidney disease: Secondary | ICD-10-CM | POA: Diagnosis not present

## 2017-05-01 DIAGNOSIS — E1142 Type 2 diabetes mellitus with diabetic polyneuropathy: Secondary | ICD-10-CM | POA: Diagnosis not present

## 2017-05-01 DIAGNOSIS — N184 Chronic kidney disease, stage 4 (severe): Secondary | ICD-10-CM | POA: Diagnosis not present

## 2017-05-01 DIAGNOSIS — D631 Anemia in chronic kidney disease: Secondary | ICD-10-CM | POA: Diagnosis not present

## 2017-05-01 DIAGNOSIS — I130 Hypertensive heart and chronic kidney disease with heart failure and stage 1 through stage 4 chronic kidney disease, or unspecified chronic kidney disease: Secondary | ICD-10-CM | POA: Diagnosis not present

## 2017-05-02 DIAGNOSIS — I495 Sick sinus syndrome: Secondary | ICD-10-CM | POA: Diagnosis not present

## 2017-05-02 DIAGNOSIS — I509 Heart failure, unspecified: Secondary | ICD-10-CM | POA: Diagnosis not present

## 2017-05-02 DIAGNOSIS — I2510 Atherosclerotic heart disease of native coronary artery without angina pectoris: Secondary | ICD-10-CM | POA: Diagnosis not present

## 2017-05-02 DIAGNOSIS — E1151 Type 2 diabetes mellitus with diabetic peripheral angiopathy without gangrene: Secondary | ICD-10-CM | POA: Diagnosis not present

## 2017-05-02 DIAGNOSIS — I259 Chronic ischemic heart disease, unspecified: Secondary | ICD-10-CM | POA: Diagnosis not present

## 2017-05-02 DIAGNOSIS — E1142 Type 2 diabetes mellitus with diabetic polyneuropathy: Secondary | ICD-10-CM | POA: Diagnosis not present

## 2017-05-02 DIAGNOSIS — E1122 Type 2 diabetes mellitus with diabetic chronic kidney disease: Secondary | ICD-10-CM | POA: Diagnosis not present

## 2017-05-02 DIAGNOSIS — J449 Chronic obstructive pulmonary disease, unspecified: Secondary | ICD-10-CM | POA: Diagnosis not present

## 2017-05-02 DIAGNOSIS — N184 Chronic kidney disease, stage 4 (severe): Secondary | ICD-10-CM | POA: Diagnosis not present

## 2017-05-02 DIAGNOSIS — D631 Anemia in chronic kidney disease: Secondary | ICD-10-CM | POA: Diagnosis not present

## 2017-05-02 DIAGNOSIS — I130 Hypertensive heart and chronic kidney disease with heart failure and stage 1 through stage 4 chronic kidney disease, or unspecified chronic kidney disease: Secondary | ICD-10-CM | POA: Diagnosis not present

## 2017-05-02 DIAGNOSIS — D509 Iron deficiency anemia, unspecified: Secondary | ICD-10-CM | POA: Diagnosis not present

## 2017-05-03 DIAGNOSIS — E1122 Type 2 diabetes mellitus with diabetic chronic kidney disease: Secondary | ICD-10-CM | POA: Diagnosis not present

## 2017-05-03 DIAGNOSIS — E1151 Type 2 diabetes mellitus with diabetic peripheral angiopathy without gangrene: Secondary | ICD-10-CM | POA: Diagnosis not present

## 2017-05-03 DIAGNOSIS — D509 Iron deficiency anemia, unspecified: Secondary | ICD-10-CM | POA: Diagnosis not present

## 2017-05-03 DIAGNOSIS — D631 Anemia in chronic kidney disease: Secondary | ICD-10-CM | POA: Diagnosis not present

## 2017-05-03 DIAGNOSIS — E1142 Type 2 diabetes mellitus with diabetic polyneuropathy: Secondary | ICD-10-CM | POA: Diagnosis not present

## 2017-05-03 DIAGNOSIS — J449 Chronic obstructive pulmonary disease, unspecified: Secondary | ICD-10-CM | POA: Diagnosis not present

## 2017-05-03 DIAGNOSIS — I130 Hypertensive heart and chronic kidney disease with heart failure and stage 1 through stage 4 chronic kidney disease, or unspecified chronic kidney disease: Secondary | ICD-10-CM | POA: Diagnosis not present

## 2017-05-03 DIAGNOSIS — I259 Chronic ischemic heart disease, unspecified: Secondary | ICD-10-CM | POA: Diagnosis not present

## 2017-05-03 DIAGNOSIS — I2510 Atherosclerotic heart disease of native coronary artery without angina pectoris: Secondary | ICD-10-CM | POA: Diagnosis not present

## 2017-05-03 DIAGNOSIS — I495 Sick sinus syndrome: Secondary | ICD-10-CM | POA: Diagnosis not present

## 2017-05-03 DIAGNOSIS — N184 Chronic kidney disease, stage 4 (severe): Secondary | ICD-10-CM | POA: Diagnosis not present

## 2017-05-03 DIAGNOSIS — I509 Heart failure, unspecified: Secondary | ICD-10-CM | POA: Diagnosis not present

## 2017-05-08 DIAGNOSIS — D631 Anemia in chronic kidney disease: Secondary | ICD-10-CM | POA: Diagnosis not present

## 2017-05-08 DIAGNOSIS — D509 Iron deficiency anemia, unspecified: Secondary | ICD-10-CM | POA: Diagnosis not present

## 2017-05-08 DIAGNOSIS — E1142 Type 2 diabetes mellitus with diabetic polyneuropathy: Secondary | ICD-10-CM | POA: Diagnosis not present

## 2017-05-08 DIAGNOSIS — I259 Chronic ischemic heart disease, unspecified: Secondary | ICD-10-CM | POA: Diagnosis not present

## 2017-05-08 DIAGNOSIS — I509 Heart failure, unspecified: Secondary | ICD-10-CM | POA: Diagnosis not present

## 2017-05-08 DIAGNOSIS — E1151 Type 2 diabetes mellitus with diabetic peripheral angiopathy without gangrene: Secondary | ICD-10-CM | POA: Diagnosis not present

## 2017-05-08 DIAGNOSIS — I495 Sick sinus syndrome: Secondary | ICD-10-CM | POA: Diagnosis not present

## 2017-05-08 DIAGNOSIS — I2510 Atherosclerotic heart disease of native coronary artery without angina pectoris: Secondary | ICD-10-CM | POA: Diagnosis not present

## 2017-05-08 DIAGNOSIS — E1122 Type 2 diabetes mellitus with diabetic chronic kidney disease: Secondary | ICD-10-CM | POA: Diagnosis not present

## 2017-05-08 DIAGNOSIS — N184 Chronic kidney disease, stage 4 (severe): Secondary | ICD-10-CM | POA: Diagnosis not present

## 2017-05-08 DIAGNOSIS — I130 Hypertensive heart and chronic kidney disease with heart failure and stage 1 through stage 4 chronic kidney disease, or unspecified chronic kidney disease: Secondary | ICD-10-CM | POA: Diagnosis not present

## 2017-05-08 DIAGNOSIS — J449 Chronic obstructive pulmonary disease, unspecified: Secondary | ICD-10-CM | POA: Diagnosis not present

## 2017-05-10 DIAGNOSIS — E1142 Type 2 diabetes mellitus with diabetic polyneuropathy: Secondary | ICD-10-CM | POA: Diagnosis not present

## 2017-05-10 DIAGNOSIS — D509 Iron deficiency anemia, unspecified: Secondary | ICD-10-CM | POA: Diagnosis not present

## 2017-05-10 DIAGNOSIS — I509 Heart failure, unspecified: Secondary | ICD-10-CM | POA: Diagnosis not present

## 2017-05-10 DIAGNOSIS — I130 Hypertensive heart and chronic kidney disease with heart failure and stage 1 through stage 4 chronic kidney disease, or unspecified chronic kidney disease: Secondary | ICD-10-CM | POA: Diagnosis not present

## 2017-05-10 DIAGNOSIS — E1151 Type 2 diabetes mellitus with diabetic peripheral angiopathy without gangrene: Secondary | ICD-10-CM | POA: Diagnosis not present

## 2017-05-10 DIAGNOSIS — E1122 Type 2 diabetes mellitus with diabetic chronic kidney disease: Secondary | ICD-10-CM | POA: Diagnosis not present

## 2017-05-10 DIAGNOSIS — I495 Sick sinus syndrome: Secondary | ICD-10-CM | POA: Diagnosis not present

## 2017-05-10 DIAGNOSIS — J449 Chronic obstructive pulmonary disease, unspecified: Secondary | ICD-10-CM | POA: Diagnosis not present

## 2017-05-10 DIAGNOSIS — I2510 Atherosclerotic heart disease of native coronary artery without angina pectoris: Secondary | ICD-10-CM | POA: Diagnosis not present

## 2017-05-10 DIAGNOSIS — N184 Chronic kidney disease, stage 4 (severe): Secondary | ICD-10-CM | POA: Diagnosis not present

## 2017-05-10 DIAGNOSIS — D631 Anemia in chronic kidney disease: Secondary | ICD-10-CM | POA: Diagnosis not present

## 2017-05-10 DIAGNOSIS — I259 Chronic ischemic heart disease, unspecified: Secondary | ICD-10-CM | POA: Diagnosis not present

## 2017-05-15 DIAGNOSIS — I2510 Atherosclerotic heart disease of native coronary artery without angina pectoris: Secondary | ICD-10-CM | POA: Diagnosis not present

## 2017-05-15 DIAGNOSIS — J449 Chronic obstructive pulmonary disease, unspecified: Secondary | ICD-10-CM | POA: Diagnosis not present

## 2017-05-15 DIAGNOSIS — E1142 Type 2 diabetes mellitus with diabetic polyneuropathy: Secondary | ICD-10-CM | POA: Diagnosis not present

## 2017-05-15 DIAGNOSIS — N184 Chronic kidney disease, stage 4 (severe): Secondary | ICD-10-CM | POA: Diagnosis not present

## 2017-05-15 DIAGNOSIS — D509 Iron deficiency anemia, unspecified: Secondary | ICD-10-CM | POA: Diagnosis not present

## 2017-05-15 DIAGNOSIS — I509 Heart failure, unspecified: Secondary | ICD-10-CM | POA: Diagnosis not present

## 2017-05-15 DIAGNOSIS — D631 Anemia in chronic kidney disease: Secondary | ICD-10-CM | POA: Diagnosis not present

## 2017-05-15 DIAGNOSIS — E1151 Type 2 diabetes mellitus with diabetic peripheral angiopathy without gangrene: Secondary | ICD-10-CM | POA: Diagnosis not present

## 2017-05-15 DIAGNOSIS — I259 Chronic ischemic heart disease, unspecified: Secondary | ICD-10-CM | POA: Diagnosis not present

## 2017-05-15 DIAGNOSIS — I495 Sick sinus syndrome: Secondary | ICD-10-CM | POA: Diagnosis not present

## 2017-05-15 DIAGNOSIS — E1122 Type 2 diabetes mellitus with diabetic chronic kidney disease: Secondary | ICD-10-CM | POA: Diagnosis not present

## 2017-05-15 DIAGNOSIS — I130 Hypertensive heart and chronic kidney disease with heart failure and stage 1 through stage 4 chronic kidney disease, or unspecified chronic kidney disease: Secondary | ICD-10-CM | POA: Diagnosis not present

## 2017-05-16 ENCOUNTER — Encounter (HOSPITAL_COMMUNITY): Payer: Medicare Other

## 2017-05-16 DIAGNOSIS — N184 Chronic kidney disease, stage 4 (severe): Secondary | ICD-10-CM | POA: Diagnosis not present

## 2017-05-16 DIAGNOSIS — I130 Hypertensive heart and chronic kidney disease with heart failure and stage 1 through stage 4 chronic kidney disease, or unspecified chronic kidney disease: Secondary | ICD-10-CM | POA: Diagnosis not present

## 2017-05-16 DIAGNOSIS — I2510 Atherosclerotic heart disease of native coronary artery without angina pectoris: Secondary | ICD-10-CM | POA: Diagnosis not present

## 2017-05-16 DIAGNOSIS — J449 Chronic obstructive pulmonary disease, unspecified: Secondary | ICD-10-CM | POA: Diagnosis not present

## 2017-05-16 DIAGNOSIS — I495 Sick sinus syndrome: Secondary | ICD-10-CM | POA: Diagnosis not present

## 2017-05-16 DIAGNOSIS — D509 Iron deficiency anemia, unspecified: Secondary | ICD-10-CM | POA: Diagnosis not present

## 2017-05-16 DIAGNOSIS — E1151 Type 2 diabetes mellitus with diabetic peripheral angiopathy without gangrene: Secondary | ICD-10-CM | POA: Diagnosis not present

## 2017-05-16 DIAGNOSIS — I259 Chronic ischemic heart disease, unspecified: Secondary | ICD-10-CM | POA: Diagnosis not present

## 2017-05-16 DIAGNOSIS — I509 Heart failure, unspecified: Secondary | ICD-10-CM | POA: Diagnosis not present

## 2017-05-16 DIAGNOSIS — E1122 Type 2 diabetes mellitus with diabetic chronic kidney disease: Secondary | ICD-10-CM | POA: Diagnosis not present

## 2017-05-16 DIAGNOSIS — D631 Anemia in chronic kidney disease: Secondary | ICD-10-CM | POA: Diagnosis not present

## 2017-05-16 DIAGNOSIS — E1142 Type 2 diabetes mellitus with diabetic polyneuropathy: Secondary | ICD-10-CM | POA: Diagnosis not present

## 2017-05-17 DIAGNOSIS — E1122 Type 2 diabetes mellitus with diabetic chronic kidney disease: Secondary | ICD-10-CM | POA: Diagnosis not present

## 2017-05-17 DIAGNOSIS — I130 Hypertensive heart and chronic kidney disease with heart failure and stage 1 through stage 4 chronic kidney disease, or unspecified chronic kidney disease: Secondary | ICD-10-CM | POA: Diagnosis not present

## 2017-05-17 DIAGNOSIS — D631 Anemia in chronic kidney disease: Secondary | ICD-10-CM | POA: Diagnosis not present

## 2017-05-17 DIAGNOSIS — N184 Chronic kidney disease, stage 4 (severe): Secondary | ICD-10-CM | POA: Diagnosis not present

## 2017-05-17 DIAGNOSIS — D509 Iron deficiency anemia, unspecified: Secondary | ICD-10-CM | POA: Diagnosis not present

## 2017-05-17 DIAGNOSIS — I259 Chronic ischemic heart disease, unspecified: Secondary | ICD-10-CM | POA: Diagnosis not present

## 2017-05-17 DIAGNOSIS — I495 Sick sinus syndrome: Secondary | ICD-10-CM | POA: Diagnosis not present

## 2017-05-17 DIAGNOSIS — I509 Heart failure, unspecified: Secondary | ICD-10-CM | POA: Diagnosis not present

## 2017-05-17 DIAGNOSIS — J449 Chronic obstructive pulmonary disease, unspecified: Secondary | ICD-10-CM | POA: Diagnosis not present

## 2017-05-17 DIAGNOSIS — E1142 Type 2 diabetes mellitus with diabetic polyneuropathy: Secondary | ICD-10-CM | POA: Diagnosis not present

## 2017-05-17 DIAGNOSIS — E1151 Type 2 diabetes mellitus with diabetic peripheral angiopathy without gangrene: Secondary | ICD-10-CM | POA: Diagnosis not present

## 2017-05-17 DIAGNOSIS — I2510 Atherosclerotic heart disease of native coronary artery without angina pectoris: Secondary | ICD-10-CM | POA: Diagnosis not present

## 2017-05-18 DIAGNOSIS — I130 Hypertensive heart and chronic kidney disease with heart failure and stage 1 through stage 4 chronic kidney disease, or unspecified chronic kidney disease: Secondary | ICD-10-CM | POA: Diagnosis not present

## 2017-05-18 DIAGNOSIS — I259 Chronic ischemic heart disease, unspecified: Secondary | ICD-10-CM | POA: Diagnosis not present

## 2017-05-18 DIAGNOSIS — I2510 Atherosclerotic heart disease of native coronary artery without angina pectoris: Secondary | ICD-10-CM | POA: Diagnosis not present

## 2017-05-18 DIAGNOSIS — D509 Iron deficiency anemia, unspecified: Secondary | ICD-10-CM | POA: Diagnosis not present

## 2017-05-18 DIAGNOSIS — E1151 Type 2 diabetes mellitus with diabetic peripheral angiopathy without gangrene: Secondary | ICD-10-CM | POA: Diagnosis not present

## 2017-05-18 DIAGNOSIS — I509 Heart failure, unspecified: Secondary | ICD-10-CM | POA: Diagnosis not present

## 2017-05-18 DIAGNOSIS — J449 Chronic obstructive pulmonary disease, unspecified: Secondary | ICD-10-CM | POA: Diagnosis not present

## 2017-05-18 DIAGNOSIS — E1122 Type 2 diabetes mellitus with diabetic chronic kidney disease: Secondary | ICD-10-CM | POA: Diagnosis not present

## 2017-05-18 DIAGNOSIS — E1142 Type 2 diabetes mellitus with diabetic polyneuropathy: Secondary | ICD-10-CM | POA: Diagnosis not present

## 2017-05-18 DIAGNOSIS — N184 Chronic kidney disease, stage 4 (severe): Secondary | ICD-10-CM | POA: Diagnosis not present

## 2017-05-18 DIAGNOSIS — D631 Anemia in chronic kidney disease: Secondary | ICD-10-CM | POA: Diagnosis not present

## 2017-05-18 DIAGNOSIS — I495 Sick sinus syndrome: Secondary | ICD-10-CM | POA: Diagnosis not present

## 2017-05-20 IMAGING — CT CT MAXILLOFACIAL W/O CM
3 of 4 series · 14 of 47 positions shown, 16 images · non-contrast
Comparison: CT of the head performed 10/19/2013

CLINICAL DATA: Stumbled in hallway, when leg gave out. Hit left
orbit, with swelling and bruising. Concern for head injury. Initial
encounter.

EXAM:
CT HEAD WITHOUT CONTRAST
CT MAXILLOFACIAL WITHOUT CONTRAST
TECHNIQUE: Multidetector CT imaging of the head and maxillofacial structures
were performed using the standard protocol without intravenous
contrast. Multiplanar CT image reconstructions of the maxillofacial
structures were also generated.

[Series 4: facial/ orbits 2.0 h30s · axial · 0.37mm/px · z∈[-184,-46]mm · 8 of 87 slices shown, 10 images]
[im 9/87  brain]
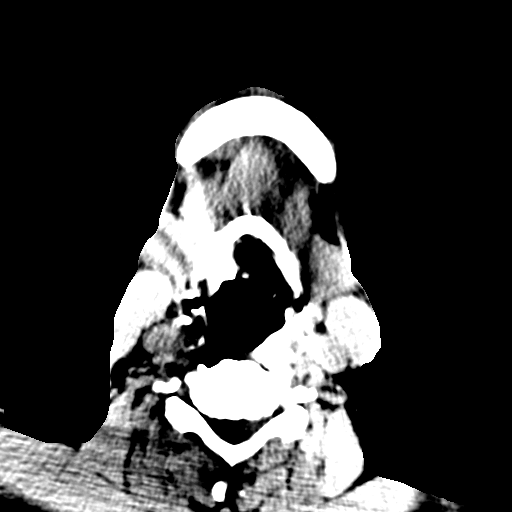
[im 9/87  bone]
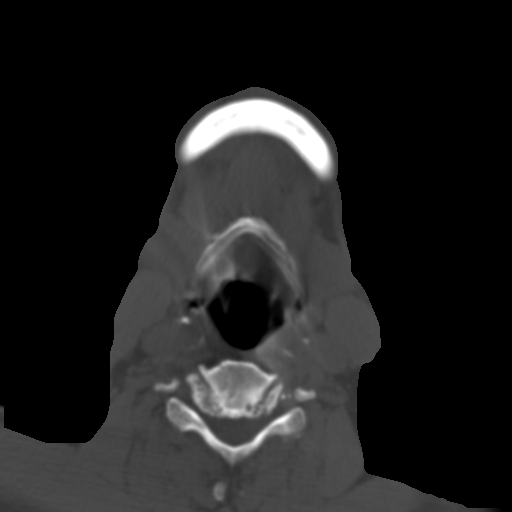
[im 17/87  bone]
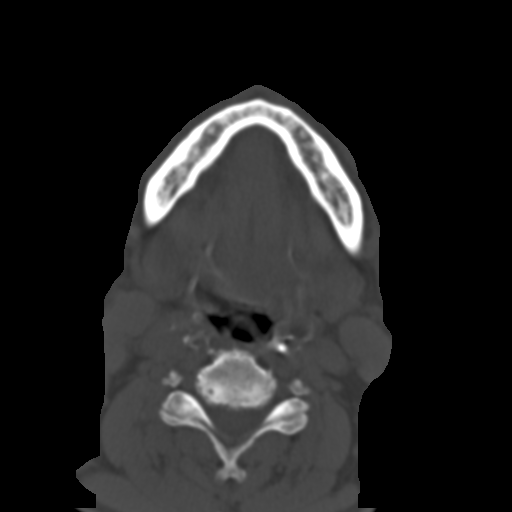
[im 29/87  bone]
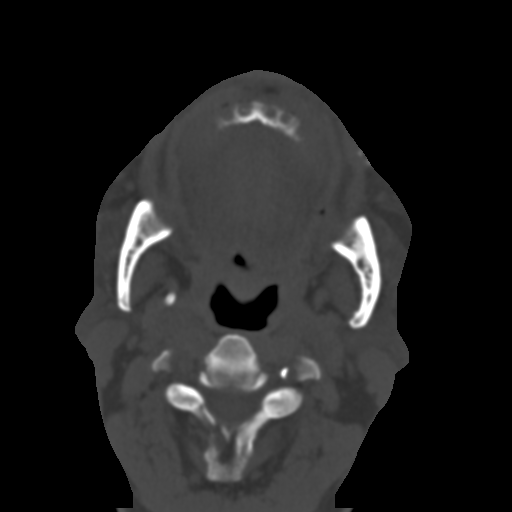
[im 37/87  bone]
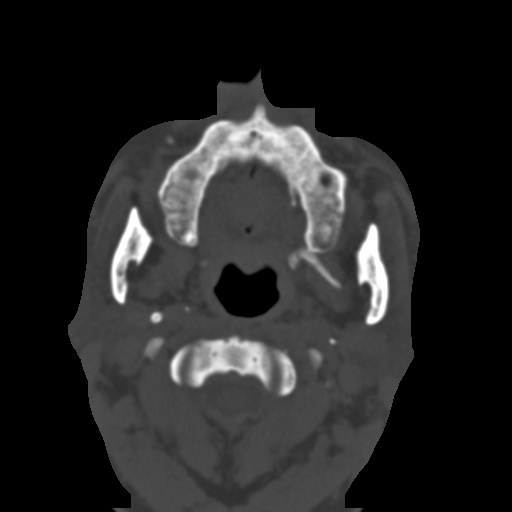
[im 50/87  brain]
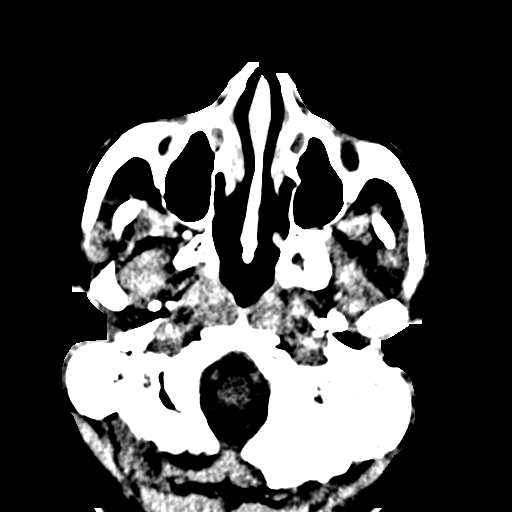
[im 50/87  bone]
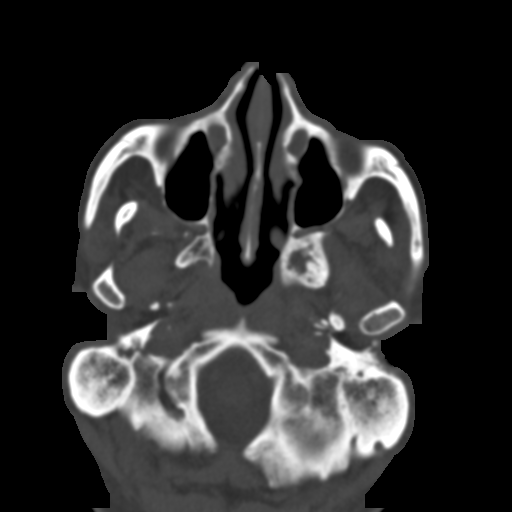
[im 58/87  bone]
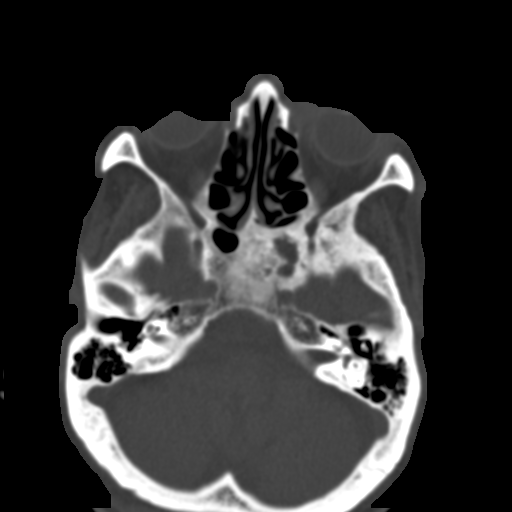
[im 70/87  bone]
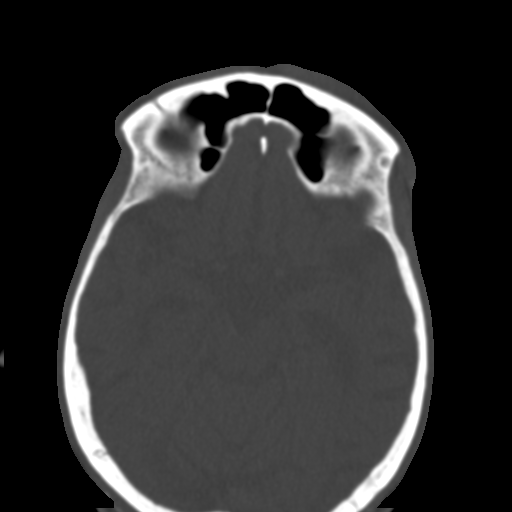
[im 78/87  bone]
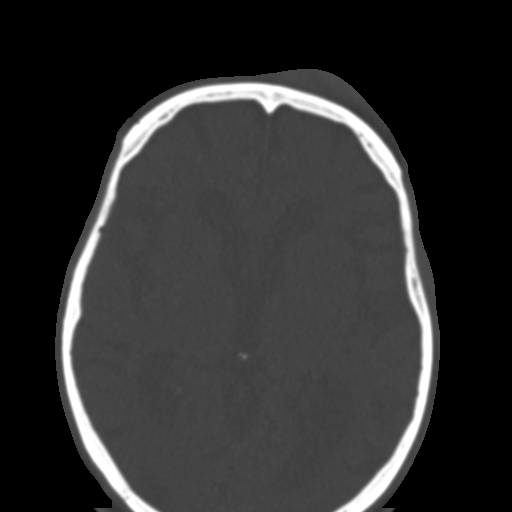

[Series 8: coronal soft tissue · coronal · 0.35mm/px · 3 of 72 slices shown]
[im 24/72  bone]
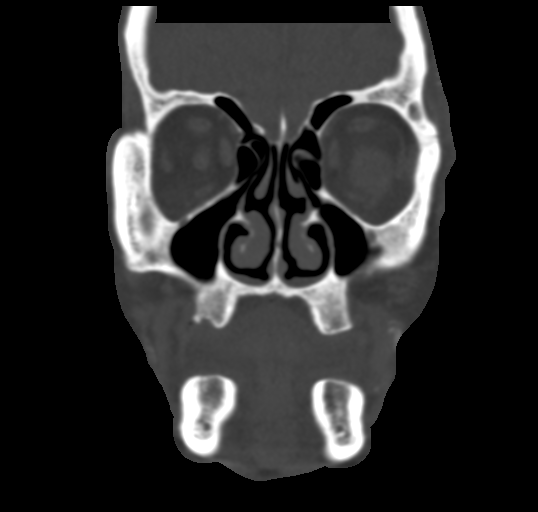
[im 32/72  bone]
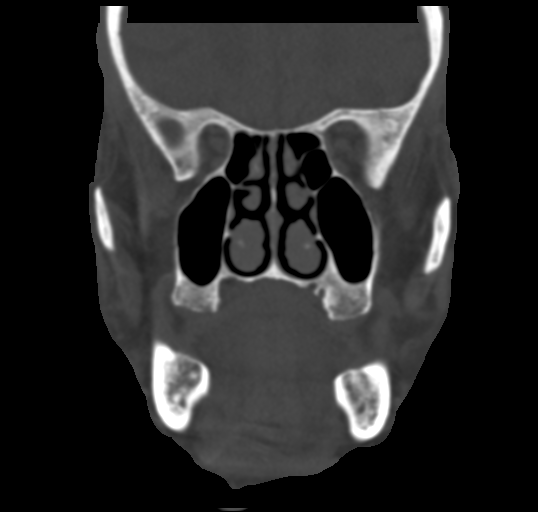
[im 40/72  bone]
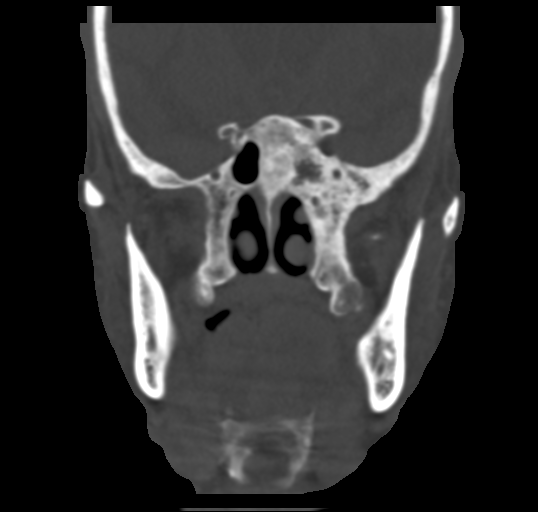

[Series 9: sagittal soft tissue · sagittal · 0.35mm/px · 3 of 83 slices shown]
[im 28/83  bone]
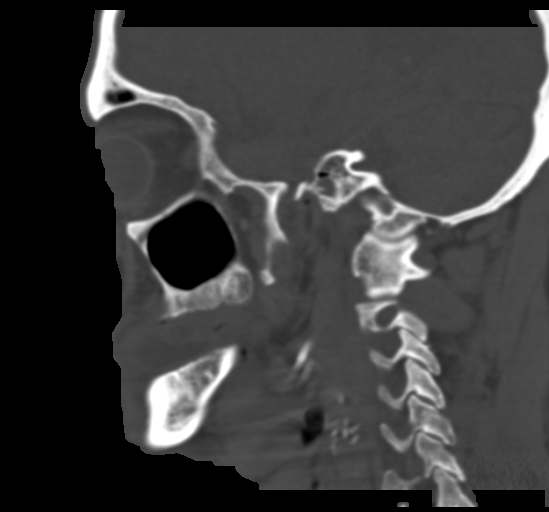
[im 42/83  bone]
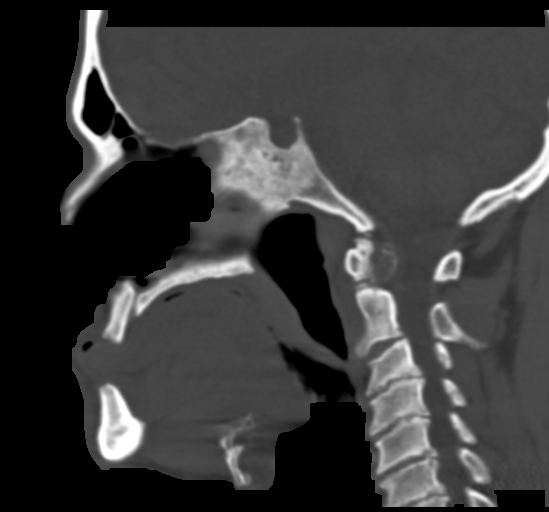
[im 55/83  bone]
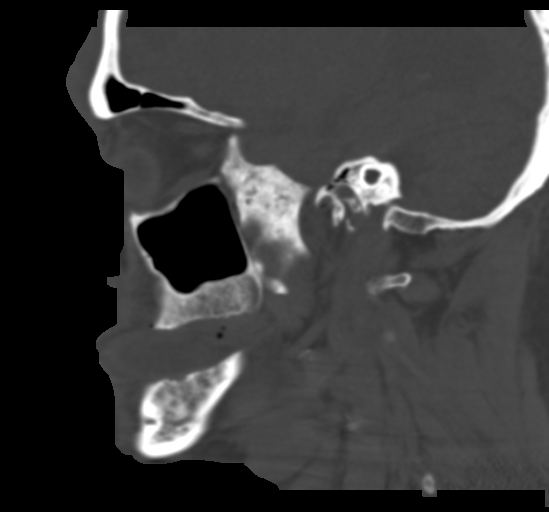

[14 of 47 positions shown; findings below may reference images not displayed]

FINDINGS: CT HEAD FINDINGS

There is no evidence of acute infarction, mass lesion, or intra- or
extra-axial hemorrhage on CT.

Prominence of the ventricles and sulci reflects moderate cortical
volume loss. Cerebellar atrophy is noted. Scattered periventricular
and subcortical white matter change likely reflects small vessel
ischemic microangiopathy. A chronic lacunar infarct is noted at the
right basal ganglia. A small chronic infarct is noted at the high
left frontoparietal region, with associated encephalomalacia.

The brainstem and fourth ventricle are within normal limits. No mass
effect or midline shift is seen.

There is no evidence of fracture; visualized osseous structures are
unremarkable in appearance. The visualized portions of the orbits
are within normal limits. The paranasal sinuses and mastoid air
cells are well-aerated. Diffuse soft tissue swelling is noted
overlying the left frontal calvarium, nose, and surrounding the left
orbit.

CT MAXILLOFACIAL FINDINGS

There is no evidence of fracture or dislocation. The maxilla and
mandible appear intact. The nasal bone is unremarkable in
appearance. There is complete chronic absence of the dentition.

Bony expansion at the left sphenoid bone may reflect Paget's
disease.

The orbits are intact bilaterally. The visualized paranasal sinuses
and mastoid air cells are well-aerated.

Soft tissue swelling is noted overlying the left frontal calvarium,
and surrounding the left orbit. The parapharyngeal fat planes are
preserved. The nasopharynx, oropharynx and hypopharynx are
unremarkable in appearance. The visualized portions of the
valleculae and piriform sinuses are grossly unremarkable.

Dense calcification is noted at the carotid bifurcations
bilaterally.

The parotid and submandibular glands are within normal limits. No
cervical lymphadenopathy is seen.
IMPRESSION: 1. No evidence of traumatic intracranial injury or fracture.
2. No evidence of fracture or dislocation with regard to the
maxillofacial structures.
3. Diffuse soft tissue swelling overlying the left frontal
calvarium, nose, and surrounding the left orbit.
4. Moderate cortical volume loss and scattered small vessel ischemic
microangiopathy.
5. Chronic lacunar infarct at the right basal ganglia, and small
chronic infarct at the high left frontoparietal region, with
associated encephalomalacia.
6. Dense calcification at the carotid bifurcations bilaterally.
Carotid ultrasound would be helpful for further evaluation, as
deemed clinically appropriate.
7. Bony expansion at the left sphenoid bone may reflect Paget's
disease.

## 2017-05-21 DIAGNOSIS — D631 Anemia in chronic kidney disease: Secondary | ICD-10-CM | POA: Diagnosis not present

## 2017-05-21 DIAGNOSIS — E1151 Type 2 diabetes mellitus with diabetic peripheral angiopathy without gangrene: Secondary | ICD-10-CM | POA: Diagnosis not present

## 2017-05-21 DIAGNOSIS — I259 Chronic ischemic heart disease, unspecified: Secondary | ICD-10-CM | POA: Diagnosis not present

## 2017-05-21 DIAGNOSIS — I509 Heart failure, unspecified: Secondary | ICD-10-CM | POA: Diagnosis not present

## 2017-05-21 DIAGNOSIS — D509 Iron deficiency anemia, unspecified: Secondary | ICD-10-CM | POA: Diagnosis not present

## 2017-05-21 DIAGNOSIS — I495 Sick sinus syndrome: Secondary | ICD-10-CM | POA: Diagnosis not present

## 2017-05-21 DIAGNOSIS — J449 Chronic obstructive pulmonary disease, unspecified: Secondary | ICD-10-CM | POA: Diagnosis not present

## 2017-05-21 DIAGNOSIS — N184 Chronic kidney disease, stage 4 (severe): Secondary | ICD-10-CM | POA: Diagnosis not present

## 2017-05-21 DIAGNOSIS — I130 Hypertensive heart and chronic kidney disease with heart failure and stage 1 through stage 4 chronic kidney disease, or unspecified chronic kidney disease: Secondary | ICD-10-CM | POA: Diagnosis not present

## 2017-05-21 DIAGNOSIS — E1122 Type 2 diabetes mellitus with diabetic chronic kidney disease: Secondary | ICD-10-CM | POA: Diagnosis not present

## 2017-05-21 DIAGNOSIS — E1142 Type 2 diabetes mellitus with diabetic polyneuropathy: Secondary | ICD-10-CM | POA: Diagnosis not present

## 2017-05-21 DIAGNOSIS — I2510 Atherosclerotic heart disease of native coronary artery without angina pectoris: Secondary | ICD-10-CM | POA: Diagnosis not present

## 2017-05-22 ENCOUNTER — Ambulatory Visit: Payer: Medicare Other | Admitting: Vascular Surgery

## 2017-05-22 DIAGNOSIS — J449 Chronic obstructive pulmonary disease, unspecified: Secondary | ICD-10-CM | POA: Diagnosis not present

## 2017-05-22 DIAGNOSIS — E1142 Type 2 diabetes mellitus with diabetic polyneuropathy: Secondary | ICD-10-CM | POA: Diagnosis not present

## 2017-05-22 DIAGNOSIS — I259 Chronic ischemic heart disease, unspecified: Secondary | ICD-10-CM | POA: Diagnosis not present

## 2017-05-22 DIAGNOSIS — I130 Hypertensive heart and chronic kidney disease with heart failure and stage 1 through stage 4 chronic kidney disease, or unspecified chronic kidney disease: Secondary | ICD-10-CM | POA: Diagnosis not present

## 2017-05-22 DIAGNOSIS — I2510 Atherosclerotic heart disease of native coronary artery without angina pectoris: Secondary | ICD-10-CM | POA: Diagnosis not present

## 2017-05-22 DIAGNOSIS — I509 Heart failure, unspecified: Secondary | ICD-10-CM | POA: Diagnosis not present

## 2017-05-22 DIAGNOSIS — E1122 Type 2 diabetes mellitus with diabetic chronic kidney disease: Secondary | ICD-10-CM | POA: Diagnosis not present

## 2017-05-22 DIAGNOSIS — I495 Sick sinus syndrome: Secondary | ICD-10-CM | POA: Diagnosis not present

## 2017-05-22 DIAGNOSIS — D509 Iron deficiency anemia, unspecified: Secondary | ICD-10-CM | POA: Diagnosis not present

## 2017-05-22 DIAGNOSIS — D631 Anemia in chronic kidney disease: Secondary | ICD-10-CM | POA: Diagnosis not present

## 2017-05-22 DIAGNOSIS — E1151 Type 2 diabetes mellitus with diabetic peripheral angiopathy without gangrene: Secondary | ICD-10-CM | POA: Diagnosis not present

## 2017-05-22 DIAGNOSIS — N184 Chronic kidney disease, stage 4 (severe): Secondary | ICD-10-CM | POA: Diagnosis not present

## 2017-05-23 DIAGNOSIS — D631 Anemia in chronic kidney disease: Secondary | ICD-10-CM | POA: Diagnosis not present

## 2017-05-23 DIAGNOSIS — E1122 Type 2 diabetes mellitus with diabetic chronic kidney disease: Secondary | ICD-10-CM | POA: Diagnosis not present

## 2017-05-23 DIAGNOSIS — I2510 Atherosclerotic heart disease of native coronary artery without angina pectoris: Secondary | ICD-10-CM | POA: Diagnosis not present

## 2017-05-23 DIAGNOSIS — D509 Iron deficiency anemia, unspecified: Secondary | ICD-10-CM | POA: Diagnosis not present

## 2017-05-23 DIAGNOSIS — I509 Heart failure, unspecified: Secondary | ICD-10-CM | POA: Diagnosis not present

## 2017-05-23 DIAGNOSIS — I259 Chronic ischemic heart disease, unspecified: Secondary | ICD-10-CM | POA: Diagnosis not present

## 2017-05-23 DIAGNOSIS — I495 Sick sinus syndrome: Secondary | ICD-10-CM | POA: Diagnosis not present

## 2017-05-23 DIAGNOSIS — J449 Chronic obstructive pulmonary disease, unspecified: Secondary | ICD-10-CM | POA: Diagnosis not present

## 2017-05-23 DIAGNOSIS — I130 Hypertensive heart and chronic kidney disease with heart failure and stage 1 through stage 4 chronic kidney disease, or unspecified chronic kidney disease: Secondary | ICD-10-CM | POA: Diagnosis not present

## 2017-05-23 DIAGNOSIS — E1151 Type 2 diabetes mellitus with diabetic peripheral angiopathy without gangrene: Secondary | ICD-10-CM | POA: Diagnosis not present

## 2017-05-23 DIAGNOSIS — E1142 Type 2 diabetes mellitus with diabetic polyneuropathy: Secondary | ICD-10-CM | POA: Diagnosis not present

## 2017-05-23 DIAGNOSIS — N184 Chronic kidney disease, stage 4 (severe): Secondary | ICD-10-CM | POA: Diagnosis not present

## 2017-05-25 DIAGNOSIS — E1122 Type 2 diabetes mellitus with diabetic chronic kidney disease: Secondary | ICD-10-CM | POA: Diagnosis not present

## 2017-05-25 DIAGNOSIS — I259 Chronic ischemic heart disease, unspecified: Secondary | ICD-10-CM | POA: Diagnosis not present

## 2017-05-25 DIAGNOSIS — N184 Chronic kidney disease, stage 4 (severe): Secondary | ICD-10-CM | POA: Diagnosis not present

## 2017-05-25 DIAGNOSIS — D509 Iron deficiency anemia, unspecified: Secondary | ICD-10-CM | POA: Diagnosis not present

## 2017-05-25 DIAGNOSIS — J449 Chronic obstructive pulmonary disease, unspecified: Secondary | ICD-10-CM | POA: Diagnosis not present

## 2017-05-25 DIAGNOSIS — I495 Sick sinus syndrome: Secondary | ICD-10-CM | POA: Diagnosis not present

## 2017-05-25 DIAGNOSIS — E1142 Type 2 diabetes mellitus with diabetic polyneuropathy: Secondary | ICD-10-CM | POA: Diagnosis not present

## 2017-05-25 DIAGNOSIS — I509 Heart failure, unspecified: Secondary | ICD-10-CM | POA: Diagnosis not present

## 2017-05-25 DIAGNOSIS — I130 Hypertensive heart and chronic kidney disease with heart failure and stage 1 through stage 4 chronic kidney disease, or unspecified chronic kidney disease: Secondary | ICD-10-CM | POA: Diagnosis not present

## 2017-05-25 DIAGNOSIS — E1151 Type 2 diabetes mellitus with diabetic peripheral angiopathy without gangrene: Secondary | ICD-10-CM | POA: Diagnosis not present

## 2017-05-25 DIAGNOSIS — D631 Anemia in chronic kidney disease: Secondary | ICD-10-CM | POA: Diagnosis not present

## 2017-05-25 DIAGNOSIS — I2510 Atherosclerotic heart disease of native coronary artery without angina pectoris: Secondary | ICD-10-CM | POA: Diagnosis not present

## 2017-05-28 DIAGNOSIS — E1122 Type 2 diabetes mellitus with diabetic chronic kidney disease: Secondary | ICD-10-CM | POA: Diagnosis not present

## 2017-05-28 DIAGNOSIS — D509 Iron deficiency anemia, unspecified: Secondary | ICD-10-CM | POA: Diagnosis not present

## 2017-05-28 DIAGNOSIS — I2510 Atherosclerotic heart disease of native coronary artery without angina pectoris: Secondary | ICD-10-CM | POA: Diagnosis not present

## 2017-05-28 DIAGNOSIS — I130 Hypertensive heart and chronic kidney disease with heart failure and stage 1 through stage 4 chronic kidney disease, or unspecified chronic kidney disease: Secondary | ICD-10-CM | POA: Diagnosis not present

## 2017-05-28 DIAGNOSIS — I495 Sick sinus syndrome: Secondary | ICD-10-CM | POA: Diagnosis not present

## 2017-05-28 DIAGNOSIS — E1151 Type 2 diabetes mellitus with diabetic peripheral angiopathy without gangrene: Secondary | ICD-10-CM | POA: Diagnosis not present

## 2017-05-28 DIAGNOSIS — N184 Chronic kidney disease, stage 4 (severe): Secondary | ICD-10-CM | POA: Diagnosis not present

## 2017-05-28 DIAGNOSIS — E1142 Type 2 diabetes mellitus with diabetic polyneuropathy: Secondary | ICD-10-CM | POA: Diagnosis not present

## 2017-05-28 DIAGNOSIS — J449 Chronic obstructive pulmonary disease, unspecified: Secondary | ICD-10-CM | POA: Diagnosis not present

## 2017-05-28 DIAGNOSIS — I509 Heart failure, unspecified: Secondary | ICD-10-CM | POA: Diagnosis not present

## 2017-05-28 DIAGNOSIS — I259 Chronic ischemic heart disease, unspecified: Secondary | ICD-10-CM | POA: Diagnosis not present

## 2017-05-28 DIAGNOSIS — D631 Anemia in chronic kidney disease: Secondary | ICD-10-CM | POA: Diagnosis not present

## 2017-05-29 DIAGNOSIS — I259 Chronic ischemic heart disease, unspecified: Secondary | ICD-10-CM | POA: Diagnosis not present

## 2017-05-29 DIAGNOSIS — I2510 Atherosclerotic heart disease of native coronary artery without angina pectoris: Secondary | ICD-10-CM | POA: Diagnosis not present

## 2017-05-29 DIAGNOSIS — I495 Sick sinus syndrome: Secondary | ICD-10-CM | POA: Diagnosis not present

## 2017-05-29 DIAGNOSIS — I509 Heart failure, unspecified: Secondary | ICD-10-CM | POA: Diagnosis not present

## 2017-05-29 DIAGNOSIS — I130 Hypertensive heart and chronic kidney disease with heart failure and stage 1 through stage 4 chronic kidney disease, or unspecified chronic kidney disease: Secondary | ICD-10-CM | POA: Diagnosis not present

## 2017-05-29 DIAGNOSIS — D631 Anemia in chronic kidney disease: Secondary | ICD-10-CM | POA: Diagnosis not present

## 2017-05-29 DIAGNOSIS — E1122 Type 2 diabetes mellitus with diabetic chronic kidney disease: Secondary | ICD-10-CM | POA: Diagnosis not present

## 2017-05-29 DIAGNOSIS — N184 Chronic kidney disease, stage 4 (severe): Secondary | ICD-10-CM | POA: Diagnosis not present

## 2017-05-29 DIAGNOSIS — E1151 Type 2 diabetes mellitus with diabetic peripheral angiopathy without gangrene: Secondary | ICD-10-CM | POA: Diagnosis not present

## 2017-05-29 DIAGNOSIS — E1142 Type 2 diabetes mellitus with diabetic polyneuropathy: Secondary | ICD-10-CM | POA: Diagnosis not present

## 2017-05-29 DIAGNOSIS — J449 Chronic obstructive pulmonary disease, unspecified: Secondary | ICD-10-CM | POA: Diagnosis not present

## 2017-05-29 DIAGNOSIS — D509 Iron deficiency anemia, unspecified: Secondary | ICD-10-CM | POA: Diagnosis not present

## 2017-05-30 DIAGNOSIS — I509 Heart failure, unspecified: Secondary | ICD-10-CM | POA: Diagnosis not present

## 2017-05-30 DIAGNOSIS — N184 Chronic kidney disease, stage 4 (severe): Secondary | ICD-10-CM | POA: Diagnosis not present

## 2017-05-30 DIAGNOSIS — E1122 Type 2 diabetes mellitus with diabetic chronic kidney disease: Secondary | ICD-10-CM | POA: Diagnosis not present

## 2017-05-30 DIAGNOSIS — E1142 Type 2 diabetes mellitus with diabetic polyneuropathy: Secondary | ICD-10-CM | POA: Diagnosis not present

## 2017-05-30 DIAGNOSIS — J449 Chronic obstructive pulmonary disease, unspecified: Secondary | ICD-10-CM | POA: Diagnosis not present

## 2017-05-30 DIAGNOSIS — I259 Chronic ischemic heart disease, unspecified: Secondary | ICD-10-CM | POA: Diagnosis not present

## 2017-05-30 DIAGNOSIS — E1151 Type 2 diabetes mellitus with diabetic peripheral angiopathy without gangrene: Secondary | ICD-10-CM | POA: Diagnosis not present

## 2017-05-30 DIAGNOSIS — I2510 Atherosclerotic heart disease of native coronary artery without angina pectoris: Secondary | ICD-10-CM | POA: Diagnosis not present

## 2017-05-30 DIAGNOSIS — I130 Hypertensive heart and chronic kidney disease with heart failure and stage 1 through stage 4 chronic kidney disease, or unspecified chronic kidney disease: Secondary | ICD-10-CM | POA: Diagnosis not present

## 2017-05-30 DIAGNOSIS — D509 Iron deficiency anemia, unspecified: Secondary | ICD-10-CM | POA: Diagnosis not present

## 2017-05-30 DIAGNOSIS — D631 Anemia in chronic kidney disease: Secondary | ICD-10-CM | POA: Diagnosis not present

## 2017-05-30 DIAGNOSIS — I495 Sick sinus syndrome: Secondary | ICD-10-CM | POA: Diagnosis not present

## 2017-06-04 ENCOUNTER — Inpatient Hospital Stay: Payer: Medicare Other | Admitting: Oncology

## 2017-06-04 ENCOUNTER — Inpatient Hospital Stay: Payer: Medicare Other

## 2017-06-04 DIAGNOSIS — N184 Chronic kidney disease, stage 4 (severe): Secondary | ICD-10-CM | POA: Diagnosis not present

## 2017-06-04 DIAGNOSIS — I2510 Atherosclerotic heart disease of native coronary artery without angina pectoris: Secondary | ICD-10-CM | POA: Diagnosis not present

## 2017-06-04 DIAGNOSIS — J449 Chronic obstructive pulmonary disease, unspecified: Secondary | ICD-10-CM | POA: Diagnosis not present

## 2017-06-04 DIAGNOSIS — I130 Hypertensive heart and chronic kidney disease with heart failure and stage 1 through stage 4 chronic kidney disease, or unspecified chronic kidney disease: Secondary | ICD-10-CM | POA: Diagnosis not present

## 2017-06-04 DIAGNOSIS — I509 Heart failure, unspecified: Secondary | ICD-10-CM | POA: Diagnosis not present

## 2017-06-04 DIAGNOSIS — E1122 Type 2 diabetes mellitus with diabetic chronic kidney disease: Secondary | ICD-10-CM | POA: Diagnosis not present

## 2017-06-04 DIAGNOSIS — I259 Chronic ischemic heart disease, unspecified: Secondary | ICD-10-CM | POA: Diagnosis not present

## 2017-06-04 DIAGNOSIS — D631 Anemia in chronic kidney disease: Secondary | ICD-10-CM | POA: Diagnosis not present

## 2017-06-04 DIAGNOSIS — E1151 Type 2 diabetes mellitus with diabetic peripheral angiopathy without gangrene: Secondary | ICD-10-CM | POA: Diagnosis not present

## 2017-06-04 DIAGNOSIS — D509 Iron deficiency anemia, unspecified: Secondary | ICD-10-CM | POA: Diagnosis not present

## 2017-06-04 DIAGNOSIS — E1142 Type 2 diabetes mellitus with diabetic polyneuropathy: Secondary | ICD-10-CM | POA: Diagnosis not present

## 2017-06-04 DIAGNOSIS — I495 Sick sinus syndrome: Secondary | ICD-10-CM | POA: Diagnosis not present

## 2017-06-05 ENCOUNTER — Telehealth: Payer: Self-pay | Admitting: Oncology

## 2017-06-05 NOTE — Telephone Encounter (Signed)
Returned call to dtr Luanna Salk re cancelling 5/13 appointments and rescheduling. Gave dtr new appointment for lab/fu/inj 5/28 @ 11:30 am.

## 2017-06-06 DIAGNOSIS — I509 Heart failure, unspecified: Secondary | ICD-10-CM | POA: Diagnosis not present

## 2017-06-06 DIAGNOSIS — D509 Iron deficiency anemia, unspecified: Secondary | ICD-10-CM | POA: Diagnosis not present

## 2017-06-06 DIAGNOSIS — I495 Sick sinus syndrome: Secondary | ICD-10-CM | POA: Diagnosis not present

## 2017-06-06 DIAGNOSIS — E1122 Type 2 diabetes mellitus with diabetic chronic kidney disease: Secondary | ICD-10-CM | POA: Diagnosis not present

## 2017-06-06 DIAGNOSIS — D631 Anemia in chronic kidney disease: Secondary | ICD-10-CM | POA: Diagnosis not present

## 2017-06-06 DIAGNOSIS — J449 Chronic obstructive pulmonary disease, unspecified: Secondary | ICD-10-CM | POA: Diagnosis not present

## 2017-06-06 DIAGNOSIS — I2510 Atherosclerotic heart disease of native coronary artery without angina pectoris: Secondary | ICD-10-CM | POA: Diagnosis not present

## 2017-06-06 DIAGNOSIS — N184 Chronic kidney disease, stage 4 (severe): Secondary | ICD-10-CM | POA: Diagnosis not present

## 2017-06-06 DIAGNOSIS — E1142 Type 2 diabetes mellitus with diabetic polyneuropathy: Secondary | ICD-10-CM | POA: Diagnosis not present

## 2017-06-06 DIAGNOSIS — E1151 Type 2 diabetes mellitus with diabetic peripheral angiopathy without gangrene: Secondary | ICD-10-CM | POA: Diagnosis not present

## 2017-06-06 DIAGNOSIS — I130 Hypertensive heart and chronic kidney disease with heart failure and stage 1 through stage 4 chronic kidney disease, or unspecified chronic kidney disease: Secondary | ICD-10-CM | POA: Diagnosis not present

## 2017-06-06 DIAGNOSIS — I259 Chronic ischemic heart disease, unspecified: Secondary | ICD-10-CM | POA: Diagnosis not present

## 2017-06-08 DIAGNOSIS — J449 Chronic obstructive pulmonary disease, unspecified: Secondary | ICD-10-CM | POA: Diagnosis not present

## 2017-06-08 DIAGNOSIS — D631 Anemia in chronic kidney disease: Secondary | ICD-10-CM | POA: Diagnosis not present

## 2017-06-08 DIAGNOSIS — I495 Sick sinus syndrome: Secondary | ICD-10-CM | POA: Diagnosis not present

## 2017-06-08 DIAGNOSIS — D509 Iron deficiency anemia, unspecified: Secondary | ICD-10-CM | POA: Diagnosis not present

## 2017-06-08 DIAGNOSIS — I259 Chronic ischemic heart disease, unspecified: Secondary | ICD-10-CM | POA: Diagnosis not present

## 2017-06-08 DIAGNOSIS — I2510 Atherosclerotic heart disease of native coronary artery without angina pectoris: Secondary | ICD-10-CM | POA: Diagnosis not present

## 2017-06-08 DIAGNOSIS — N184 Chronic kidney disease, stage 4 (severe): Secondary | ICD-10-CM | POA: Diagnosis not present

## 2017-06-08 DIAGNOSIS — E1151 Type 2 diabetes mellitus with diabetic peripheral angiopathy without gangrene: Secondary | ICD-10-CM | POA: Diagnosis not present

## 2017-06-08 DIAGNOSIS — E1122 Type 2 diabetes mellitus with diabetic chronic kidney disease: Secondary | ICD-10-CM | POA: Diagnosis not present

## 2017-06-08 DIAGNOSIS — E1142 Type 2 diabetes mellitus with diabetic polyneuropathy: Secondary | ICD-10-CM | POA: Diagnosis not present

## 2017-06-08 DIAGNOSIS — I130 Hypertensive heart and chronic kidney disease with heart failure and stage 1 through stage 4 chronic kidney disease, or unspecified chronic kidney disease: Secondary | ICD-10-CM | POA: Diagnosis not present

## 2017-06-08 DIAGNOSIS — I509 Heart failure, unspecified: Secondary | ICD-10-CM | POA: Diagnosis not present

## 2017-06-11 DIAGNOSIS — E1121 Type 2 diabetes mellitus with diabetic nephropathy: Secondary | ICD-10-CM | POA: Diagnosis not present

## 2017-06-11 DIAGNOSIS — R413 Other amnesia: Secondary | ICD-10-CM | POA: Diagnosis not present

## 2017-06-11 DIAGNOSIS — I10 Essential (primary) hypertension: Secondary | ICD-10-CM | POA: Diagnosis not present

## 2017-06-12 DIAGNOSIS — E1142 Type 2 diabetes mellitus with diabetic polyneuropathy: Secondary | ICD-10-CM | POA: Diagnosis not present

## 2017-06-12 DIAGNOSIS — D509 Iron deficiency anemia, unspecified: Secondary | ICD-10-CM | POA: Diagnosis not present

## 2017-06-12 DIAGNOSIS — J449 Chronic obstructive pulmonary disease, unspecified: Secondary | ICD-10-CM | POA: Diagnosis not present

## 2017-06-12 DIAGNOSIS — D631 Anemia in chronic kidney disease: Secondary | ICD-10-CM | POA: Diagnosis not present

## 2017-06-12 DIAGNOSIS — N184 Chronic kidney disease, stage 4 (severe): Secondary | ICD-10-CM | POA: Diagnosis not present

## 2017-06-12 DIAGNOSIS — I509 Heart failure, unspecified: Secondary | ICD-10-CM | POA: Diagnosis not present

## 2017-06-12 DIAGNOSIS — E1122 Type 2 diabetes mellitus with diabetic chronic kidney disease: Secondary | ICD-10-CM | POA: Diagnosis not present

## 2017-06-12 DIAGNOSIS — E1151 Type 2 diabetes mellitus with diabetic peripheral angiopathy without gangrene: Secondary | ICD-10-CM | POA: Diagnosis not present

## 2017-06-12 DIAGNOSIS — I495 Sick sinus syndrome: Secondary | ICD-10-CM | POA: Diagnosis not present

## 2017-06-12 DIAGNOSIS — I259 Chronic ischemic heart disease, unspecified: Secondary | ICD-10-CM | POA: Diagnosis not present

## 2017-06-12 DIAGNOSIS — I130 Hypertensive heart and chronic kidney disease with heart failure and stage 1 through stage 4 chronic kidney disease, or unspecified chronic kidney disease: Secondary | ICD-10-CM | POA: Diagnosis not present

## 2017-06-12 DIAGNOSIS — I2510 Atherosclerotic heart disease of native coronary artery without angina pectoris: Secondary | ICD-10-CM | POA: Diagnosis not present

## 2017-06-13 DIAGNOSIS — I2510 Atherosclerotic heart disease of native coronary artery without angina pectoris: Secondary | ICD-10-CM | POA: Diagnosis not present

## 2017-06-13 DIAGNOSIS — J449 Chronic obstructive pulmonary disease, unspecified: Secondary | ICD-10-CM | POA: Diagnosis not present

## 2017-06-13 DIAGNOSIS — N184 Chronic kidney disease, stage 4 (severe): Secondary | ICD-10-CM | POA: Diagnosis not present

## 2017-06-13 DIAGNOSIS — D631 Anemia in chronic kidney disease: Secondary | ICD-10-CM | POA: Diagnosis not present

## 2017-06-13 DIAGNOSIS — I259 Chronic ischemic heart disease, unspecified: Secondary | ICD-10-CM | POA: Diagnosis not present

## 2017-06-13 DIAGNOSIS — E1122 Type 2 diabetes mellitus with diabetic chronic kidney disease: Secondary | ICD-10-CM | POA: Diagnosis not present

## 2017-06-13 DIAGNOSIS — I495 Sick sinus syndrome: Secondary | ICD-10-CM | POA: Diagnosis not present

## 2017-06-13 DIAGNOSIS — I130 Hypertensive heart and chronic kidney disease with heart failure and stage 1 through stage 4 chronic kidney disease, or unspecified chronic kidney disease: Secondary | ICD-10-CM | POA: Diagnosis not present

## 2017-06-13 DIAGNOSIS — I509 Heart failure, unspecified: Secondary | ICD-10-CM | POA: Diagnosis not present

## 2017-06-13 DIAGNOSIS — E1151 Type 2 diabetes mellitus with diabetic peripheral angiopathy without gangrene: Secondary | ICD-10-CM | POA: Diagnosis not present

## 2017-06-13 DIAGNOSIS — E1142 Type 2 diabetes mellitus with diabetic polyneuropathy: Secondary | ICD-10-CM | POA: Diagnosis not present

## 2017-06-13 DIAGNOSIS — D509 Iron deficiency anemia, unspecified: Secondary | ICD-10-CM | POA: Diagnosis not present

## 2017-06-15 DIAGNOSIS — I509 Heart failure, unspecified: Secondary | ICD-10-CM | POA: Diagnosis not present

## 2017-06-15 DIAGNOSIS — I130 Hypertensive heart and chronic kidney disease with heart failure and stage 1 through stage 4 chronic kidney disease, or unspecified chronic kidney disease: Secondary | ICD-10-CM | POA: Diagnosis not present

## 2017-06-15 DIAGNOSIS — E1142 Type 2 diabetes mellitus with diabetic polyneuropathy: Secondary | ICD-10-CM | POA: Diagnosis not present

## 2017-06-15 DIAGNOSIS — E1151 Type 2 diabetes mellitus with diabetic peripheral angiopathy without gangrene: Secondary | ICD-10-CM | POA: Diagnosis not present

## 2017-06-19 ENCOUNTER — Telehealth: Payer: Self-pay

## 2017-06-19 ENCOUNTER — Inpatient Hospital Stay: Payer: Medicare Other | Attending: Oncology | Admitting: Oncology

## 2017-06-19 ENCOUNTER — Inpatient Hospital Stay: Payer: Medicare Other

## 2017-06-19 ENCOUNTER — Telehealth: Payer: Self-pay | Admitting: Oncology

## 2017-06-19 VITALS — BP 180/54 | HR 55 | Temp 97.9°F | Resp 18 | Ht 67.0 in | Wt 163.0 lb

## 2017-06-19 DIAGNOSIS — D649 Anemia, unspecified: Secondary | ICD-10-CM

## 2017-06-19 DIAGNOSIS — J449 Chronic obstructive pulmonary disease, unspecified: Secondary | ICD-10-CM | POA: Diagnosis not present

## 2017-06-19 DIAGNOSIS — F0390 Unspecified dementia without behavioral disturbance: Secondary | ICD-10-CM | POA: Diagnosis not present

## 2017-06-19 DIAGNOSIS — I2510 Atherosclerotic heart disease of native coronary artery without angina pectoris: Secondary | ICD-10-CM | POA: Diagnosis not present

## 2017-06-19 DIAGNOSIS — C61 Malignant neoplasm of prostate: Secondary | ICD-10-CM | POA: Diagnosis not present

## 2017-06-19 DIAGNOSIS — Z79818 Long term (current) use of other agents affecting estrogen receptors and estrogen levels: Secondary | ICD-10-CM | POA: Insufficient documentation

## 2017-06-19 DIAGNOSIS — E119 Type 2 diabetes mellitus without complications: Secondary | ICD-10-CM | POA: Insufficient documentation

## 2017-06-19 DIAGNOSIS — R351 Nocturia: Secondary | ICD-10-CM | POA: Insufficient documentation

## 2017-06-19 DIAGNOSIS — I739 Peripheral vascular disease, unspecified: Secondary | ICD-10-CM | POA: Insufficient documentation

## 2017-06-19 LAB — CMP (CANCER CENTER ONLY)
ALT: 27 U/L (ref 0–55)
AST: 32 U/L (ref 5–34)
Albumin: 3.4 g/dL — ABNORMAL LOW (ref 3.5–5.0)
Alkaline Phosphatase: 72 U/L (ref 40–150)
Anion gap: 8 (ref 3–11)
BILIRUBIN TOTAL: 0.3 mg/dL (ref 0.2–1.2)
BUN: 86 mg/dL — ABNORMAL HIGH (ref 7–26)
CHLORIDE: 110 mmol/L — AB (ref 98–109)
CO2: 20 mmol/L — ABNORMAL LOW (ref 22–29)
Calcium: 10 mg/dL (ref 8.4–10.4)
Creatinine: 2.8 mg/dL — ABNORMAL HIGH (ref 0.70–1.30)
GFR, EST AFRICAN AMERICAN: 22 mL/min — AB (ref >60)
GFR, EST NON AFRICAN AMERICAN: 19 mL/min — AB (ref >60)
Glucose, Bld: 187 mg/dL — ABNORMAL HIGH (ref 70–140)
POTASSIUM: 5.2 mmol/L — AB (ref 3.5–5.1)
Sodium: 138 mmol/L (ref 136–145)
TOTAL PROTEIN: 7.9 g/dL (ref 6.4–8.3)

## 2017-06-19 LAB — CBC WITH DIFFERENTIAL (CANCER CENTER ONLY)
Basophils Absolute: 0 10*3/uL (ref 0.0–0.1)
Basophils Relative: 1 %
EOS PCT: 4 %
Eosinophils Absolute: 0.2 10*3/uL (ref 0.0–0.5)
HEMATOCRIT: 31.1 % — AB (ref 38.4–49.9)
Hemoglobin: 9.5 g/dL — ABNORMAL LOW (ref 13.0–17.1)
LYMPHS ABS: 1.1 10*3/uL (ref 0.9–3.3)
Lymphocytes Relative: 22 %
MCH: 25.3 pg — AB (ref 27.2–33.4)
MCHC: 30.7 g/dL — AB (ref 32.0–36.0)
MCV: 82.6 fL (ref 79.3–98.0)
MONOS PCT: 9 %
Monocytes Absolute: 0.4 10*3/uL (ref 0.1–0.9)
NEUTROS ABS: 3.2 10*3/uL (ref 1.5–6.5)
Neutrophils Relative %: 64 %
PLATELETS: 212 10*3/uL (ref 140–400)
RBC: 3.77 MIL/uL — ABNORMAL LOW (ref 4.20–5.82)
RDW: 17.5 % — AB (ref 11.0–14.6)
WBC Count: 4.9 10*3/uL (ref 4.0–10.3)

## 2017-06-19 MED ORDER — LEUPROLIDE ACETATE (3 MONTH) 22.5 MG IM KIT
22.5000 mg | PACK | Freq: Once | INTRAMUSCULAR | Status: AC
  Administered 2017-06-19: 22.5 mg via INTRAMUSCULAR

## 2017-06-19 NOTE — Telephone Encounter (Signed)
Called to verify appointment time . Per 5/28 phone que

## 2017-06-19 NOTE — Telephone Encounter (Signed)
Spoke with [facility] office [place] location. Patient is a current a established patient and they are requesting last appointment notes only. Printed avs and calender of upcoming appointment. Per 5/28 los

## 2017-06-19 NOTE — Telephone Encounter (Signed)
Faxed medical records to [facility] on 06/19/17, Release ID [id number]

## 2017-06-19 NOTE — Patient Instructions (Signed)
Leuprolide depot injection What is this medicine? LEUPROLIDE (loo PROE lide) is a man-made protein that acts like a natural hormone in the body. It decreases testosterone in men and decreases estrogen in women. In men, this medicine is used to treat advanced prostate cancer. In women, some forms of this medicine may be used to treat endometriosis, uterine fibroids, or other male hormone-related problems. This medicine may be used for other purposes; ask your health care provider or pharmacist if you have questions. COMMON BRAND NAME(S): Eligard, Lupron Depot, Lupron Depot-Ped, Viadur What should I tell my health care provider before I take this medicine? They need to know if you have any of these conditions: -diabetes -heart disease or previous heart attack -high blood pressure -high cholesterol -mental illness -osteoporosis -pain or difficulty passing urine -seizures -spinal cord metastasis -stroke -suicidal thoughts, plans, or attempt; a previous suicide attempt by you or a family member -tobacco smoker -unusual vaginal bleeding (women) -an unusual or allergic reaction to leuprolide, benzyl alcohol, other medicines, foods, dyes, or preservatives -pregnant or trying to get pregnant -breast-feeding How should I use this medicine? This medicine is for injection into a muscle or for injection under the skin. It is given by a health care professional in a hospital or clinic setting. The specific product will determine how it will be given to you. Make sure you understand which product you receive and how often you will receive it. Talk to your pediatrician regarding the use of this medicine in children. Special care may be needed. Overdosage: If you think you have taken too much of this medicine contact a poison control center or emergency room at once. NOTE: This medicine is only for you. Do not share this medicine with others. What if I miss a dose? It is important not to miss a dose.  Call your doctor or health care professional if you are unable to keep an appointment. Depot injections: Depot injections are given either once-monthly, every 12 weeks, every 16 weeks, or every 24 weeks depending on the product you are prescribed. The product you are prescribed will be based on if you are male or male, and your condition. Make sure you understand your product and dosing. What may interact with this medicine? Do not take this medicine with any of the following medications: -chasteberry This medicine may also interact with the following medications: -herbal or dietary supplements, like black cohosh or DHEA -male hormones, like estrogens or progestins and birth control pills, patches, rings, or injections -male hormones, like testosterone This list may not describe all possible interactions. Give your health care provider a list of all the medicines, herbs, non-prescription drugs, or dietary supplements you use. Also tell them if you smoke, drink alcohol, or use illegal drugs. Some items may interact with your medicine. What should I watch for while using this medicine? Visit your doctor or health care professional for regular checks on your progress. During the first weeks of treatment, your symptoms may get worse, but then will improve as you continue your treatment. You may get hot flashes, increased bone pain, increased difficulty passing urine, or an aggravation of nerve symptoms. Discuss these effects with your doctor or health care professional, some of them may improve with continued use of this medicine. Male patients may experience a menstrual cycle or spotting during the first months of therapy with this medicine. If this continues, contact your doctor or health care professional. What side effects may I notice from receiving this medicine? Side   effects that you should report to your doctor or health care professional as soon as possible: -allergic reactions like skin  rash, itching or hives, swelling of the face, lips, or tongue -breathing problems -chest pain -depression or memory disorders -pain in your legs or groin -pain at site where injected or implanted -seizures -severe headache -swelling of the feet and legs -suicidal thoughts or other mood changes -visual changes -vomiting Side effects that usually do not require medical attention (report to your doctor or health care professional if they continue or are bothersome): -breast swelling or tenderness -decrease in sex drive or performance -diarrhea -hot flashes -loss of appetite -muscle, joint, or bone pains -nausea -redness or irritation at site where injected or implanted -skin problems or acne This list may not describe all possible side effects. Call your doctor for medical advice about side effects. You may report side effects to FDA at 1-800-FDA-1088. Where should I keep my medicine? This drug is given in a hospital or clinic and will not be stored at home. NOTE: This sheet is a summary. It may not cover all possible information. If you have questions about this medicine, talk to your doctor, pharmacist, or health care provider.  2018 Elsevier/Gold Standard (2015-06-24 09:45:53)  

## 2017-06-19 NOTE — Progress Notes (Signed)
  [name] OFFICE PROGRESS NOTE   Diagnosis: Prostate cancer  INTERVAL HISTORY:   Colin [name] returns as scheduled.  Feels well.  He complains of nocturia.  He continues every 26-month Lupron.  He is following up with nephrology.  Objective:  Vital signs in last 24 hours:  Blood pressure (!) 180/54, pulse (!) 55, temperature 97.9 F (36.6 C), temperature source Oral, resp. rate 18, height 5\' 7"  (1.702 m), weight 163 lb (73.9 kg), SpO2 100 %.    HEENT: Neck without mass Lymphatics: No cervical, supraclavicular, axillary, or inguinal nodes Resp: Clear bilaterally Cardio: Regular rate and rhythm GI: No hepatosplenomegaly, nontender Vascular: No leg edema   Lab Results:  Lab Results  Component Value Date   WBC 4.9 06/19/2017   HGB 9.5 (L) 06/19/2017   HCT 31.1 (L) 06/19/2017   MCV 82.6 06/19/2017   PLT 212 06/19/2017   NEUTROABS 3.2 06/19/2017    CMP  Lab Results  Component Value Date   NA 138 06/19/2017   K 5.2 (H) 06/19/2017   CL 110 (H) 06/19/2017   CO2 20 (L) 06/19/2017   GLUCOSE 187 (H) 06/19/2017   BUN 86 (H) 06/19/2017   CREATININE 2.80 (H) 06/19/2017   CALCIUM 10.0 06/19/2017   PROT 7.9 06/19/2017   ALBUMIN 3.4 (L) 06/19/2017   AST 32 06/19/2017   ALT 27 06/19/2017   ALKPHOS 72 06/19/2017   BILITOT 0.3 06/19/2017   GFRNONAA 19 (L) 06/19/2017   GFRAA 22 (L) 06/19/2017     Medications: I have reviewed the patient's current medications.   Assessment/Plan: 1. Prostate cancer-metastatic, hormone sensitive ? Initiation of Lupron/case andCasodex2018,followed byevery Lupron-six-month dose given 03/27/2016 ? CT abdomen/pelvis 02/29/2016-enlarged prostate, extensive abdominal/retroperitoneal/pelvic adenopathy ? 03/06/2016 PSA 1590 ? Bone scan 03/14/2016-negative for metastatic disease ? 07/03/2016 PSA 1.96 ? 66-month Lupron given 12/11/2016, 03/05/2017, 06/19/2017 2.History of coronary artery disease 3.Diabetes 4.Peripheral  vascular disease 5.Anemia-likely secondary to renal insufficiency and metastatic prostate cancer 6.Chronic renal failure 7.COPD 8.Dementia 9.   Nocturia      Disposition: Colin [name] appears unchanged.  He will continue every 66-month Lupron.  We will follow-up on the PSA from today.  He complains of urinary frequency and nocturia.  I will refer him back to Dr. Rosana Hoes.  He has chronic anemia, likely secondary to renal insufficiency.  He does not appear symptomatic from anemia at present.  Mr. Pease will return for an office visit and Lupron in 3 months.  15 minutes were spent with the patient today.  The majority of the time was used for counseling and coordination of care.  Betsy Coder, MD  06/19/2017  2:29 PM

## 2017-06-20 LAB — PROSTATE-SPECIFIC AG, SERUM (LABCORP): Prostate Specific Ag, Serum: 0.2 ng/mL (ref 0.0–4.0)

## 2017-08-29 ENCOUNTER — Other Ambulatory Visit: Payer: Self-pay | Admitting: Cardiovascular Disease

## 2017-08-29 MED ORDER — ISOSORBIDE MONONITRATE ER 30 MG PO TB24: 30.0000 mg | ORAL_TABLET | Freq: Every day | ORAL | 0 refills | Status: DC

## 2017-09-18 ENCOUNTER — Inpatient Hospital Stay: Payer: Medicare Other | Attending: Oncology | Admitting: Oncology

## 2017-09-18 ENCOUNTER — Inpatient Hospital Stay: Payer: Medicare Other

## 2017-10-10 ENCOUNTER — Telehealth: Payer: Self-pay | Admitting: Oncology

## 2017-10-10 NOTE — Telephone Encounter (Signed)
R/s missed appt from 8/27 - patient is aware of appt on 10/4

## 2017-10-16 DIAGNOSIS — E785 Hyperlipidemia, unspecified: Secondary | ICD-10-CM | POA: Diagnosis not present

## 2017-10-16 DIAGNOSIS — Z23 Encounter for immunization: Secondary | ICD-10-CM | POA: Diagnosis not present

## 2017-10-16 DIAGNOSIS — E1121 Type 2 diabetes mellitus with diabetic nephropathy: Secondary | ICD-10-CM | POA: Diagnosis not present

## 2017-10-16 DIAGNOSIS — I10 Essential (primary) hypertension: Secondary | ICD-10-CM | POA: Diagnosis not present

## 2017-10-16 DIAGNOSIS — I25111 Atherosclerotic heart disease of native coronary artery with angina pectoris with documented spasm: Secondary | ICD-10-CM | POA: Diagnosis not present

## 2017-10-17 ENCOUNTER — Telehealth: Payer: Self-pay | Admitting: Emergency Medicine

## 2017-10-17 NOTE — Telephone Encounter (Signed)
Spoke to the patients daughter who verbalized understanding of appts on 10/4.

## 2017-10-26 ENCOUNTER — Inpatient Hospital Stay: Payer: Medicare Other | Admitting: Nurse Practitioner

## 2017-10-26 ENCOUNTER — Other Ambulatory Visit: Payer: Medicare Other

## 2017-10-26 ENCOUNTER — Inpatient Hospital Stay: Payer: Medicare Other

## 2017-10-26 ENCOUNTER — Telehealth: Payer: Self-pay | Admitting: Oncology

## 2017-10-26 NOTE — Telephone Encounter (Signed)
Scheduled appt per 10/4  sch message - pt is aware of appt date and time   

## 2017-11-06 ENCOUNTER — Encounter: Payer: Self-pay | Admitting: Nurse Practitioner

## 2017-11-06 ENCOUNTER — Inpatient Hospital Stay: Payer: Medicare Other | Attending: Oncology | Admitting: Nurse Practitioner

## 2017-11-06 ENCOUNTER — Telehealth: Payer: Self-pay

## 2017-11-06 ENCOUNTER — Inpatient Hospital Stay: Payer: Medicare Other

## 2017-11-06 VITALS — BP 170/58 | HR 62 | Temp 97.8°F | Resp 17 | Ht 67.0 in | Wt 159.9 lb

## 2017-11-06 DIAGNOSIS — C775 Secondary and unspecified malignant neoplasm of intrapelvic lymph nodes: Secondary | ICD-10-CM

## 2017-11-06 DIAGNOSIS — I739 Peripheral vascular disease, unspecified: Secondary | ICD-10-CM | POA: Insufficient documentation

## 2017-11-06 DIAGNOSIS — C61 Malignant neoplasm of prostate: Secondary | ICD-10-CM

## 2017-11-06 DIAGNOSIS — J449 Chronic obstructive pulmonary disease, unspecified: Secondary | ICD-10-CM | POA: Insufficient documentation

## 2017-11-06 DIAGNOSIS — Z79818 Long term (current) use of other agents affecting estrogen receptors and estrogen levels: Secondary | ICD-10-CM | POA: Diagnosis not present

## 2017-11-06 DIAGNOSIS — I2510 Atherosclerotic heart disease of native coronary artery without angina pectoris: Secondary | ICD-10-CM | POA: Insufficient documentation

## 2017-11-06 DIAGNOSIS — N189 Chronic kidney disease, unspecified: Secondary | ICD-10-CM | POA: Diagnosis not present

## 2017-11-06 DIAGNOSIS — F0390 Unspecified dementia without behavioral disturbance: Secondary | ICD-10-CM | POA: Insufficient documentation

## 2017-11-06 DIAGNOSIS — E1122 Type 2 diabetes mellitus with diabetic chronic kidney disease: Secondary | ICD-10-CM | POA: Diagnosis not present

## 2017-11-06 DIAGNOSIS — E1151 Type 2 diabetes mellitus with diabetic peripheral angiopathy without gangrene: Secondary | ICD-10-CM | POA: Diagnosis not present

## 2017-11-06 DIAGNOSIS — D649 Anemia, unspecified: Secondary | ICD-10-CM | POA: Diagnosis not present

## 2017-11-06 DIAGNOSIS — R351 Nocturia: Secondary | ICD-10-CM

## 2017-11-06 LAB — CBC WITH DIFFERENTIAL (CANCER CENTER ONLY)
Abs Immature Granulocytes: 0.02 10*3/uL (ref 0.00–0.07)
BASOS ABS: 0 10*3/uL (ref 0.0–0.1)
BASOS PCT: 1 %
EOS ABS: 0.1 10*3/uL (ref 0.0–0.5)
EOS PCT: 2 %
HEMATOCRIT: 33.7 % — AB (ref 39.0–52.0)
Hemoglobin: 10.1 g/dL — ABNORMAL LOW (ref 13.0–17.0)
Immature Granulocytes: 0 %
Lymphocytes Relative: 15 %
Lymphs Abs: 0.9 10*3/uL (ref 0.7–4.0)
MCH: 26 pg (ref 26.0–34.0)
MCHC: 30 g/dL (ref 30.0–36.0)
MCV: 86.6 fL (ref 80.0–100.0)
Monocytes Absolute: 0.5 10*3/uL (ref 0.1–1.0)
Monocytes Relative: 9 %
NRBC: 0 % (ref 0.0–0.2)
Neutro Abs: 4.4 10*3/uL (ref 1.7–7.7)
Neutrophils Relative %: 73 %
PLATELETS: 195 10*3/uL (ref 150–400)
RBC: 3.89 MIL/uL — ABNORMAL LOW (ref 4.22–5.81)
RDW: 16.9 % — AB (ref 11.5–15.5)
WBC: 6 10*3/uL (ref 4.0–10.5)

## 2017-11-06 LAB — CMP (CANCER CENTER ONLY)
ALK PHOS: 77 U/L (ref 38–126)
ALT: 25 U/L (ref 0–44)
ANION GAP: 9 (ref 5–15)
AST: 35 U/L (ref 15–41)
Albumin: 3.4 g/dL — ABNORMAL LOW (ref 3.5–5.0)
BILIRUBIN TOTAL: 0.4 mg/dL (ref 0.3–1.2)
BUN: 96 mg/dL — ABNORMAL HIGH (ref 8–23)
CALCIUM: 10.2 mg/dL (ref 8.9–10.3)
CO2: 23 mmol/L (ref 22–32)
CREATININE: 3.25 mg/dL — AB (ref 0.61–1.24)
Chloride: 108 mmol/L (ref 98–111)
GFR, EST NON AFRICAN AMERICAN: 16 mL/min — AB (ref >60)
GFR, Est AFR Am: 18 mL/min — ABNORMAL LOW (ref >60)
Glucose, Bld: 207 mg/dL — ABNORMAL HIGH (ref 70–99)
Potassium: 5.1 mmol/L (ref 3.5–5.1)
Sodium: 140 mmol/L (ref 135–145)
TOTAL PROTEIN: 8 g/dL (ref 6.5–8.1)

## 2017-11-06 MED ORDER — LEUPROLIDE ACETATE (3 MONTH) 22.5 MG IM KIT
22.5000 mg | PACK | Freq: Once | INTRAMUSCULAR | Status: AC
  Administered 2017-11-06: 22.5 mg via INTRAMUSCULAR

## 2017-11-06 NOTE — Telephone Encounter (Signed)
Printed avs and calender of upcoming appointment. Per 10/15 los 

## 2017-11-06 NOTE — Patient Instructions (Signed)
Leuprolide depot injection What is this medicine? LEUPROLIDE (loo PROE lide) is a man-made protein that acts like a natural hormone in the body. It decreases testosterone in men and decreases estrogen in women. In men, this medicine is used to treat advanced prostate cancer. In women, some forms of this medicine may be used to treat endometriosis, uterine fibroids, or other male hormone-related problems. This medicine may be used for other purposes; ask your health care provider or pharmacist if you have questions. COMMON BRAND NAME(S): Eligard, Lupron Depot, Lupron Depot-Ped, Viadur What should I tell my health care provider before I take this medicine? They need to know if you have any of these conditions: -diabetes -heart disease or previous heart attack -high blood pressure -high cholesterol -mental illness -osteoporosis -pain or difficulty passing urine -seizures -spinal cord metastasis -stroke -suicidal thoughts, plans, or attempt; a previous suicide attempt by you or a family member -tobacco smoker -unusual vaginal bleeding (women) -an unusual or allergic reaction to leuprolide, benzyl alcohol, other medicines, foods, dyes, or preservatives -pregnant or trying to get pregnant -breast-feeding How should I use this medicine? This medicine is for injection into a muscle or for injection under the skin. It is given by a health care professional in a hospital or clinic setting. The specific product will determine how it will be given to you. Make sure you understand which product you receive and how often you will receive it. Talk to your pediatrician regarding the use of this medicine in children. Special care may be needed. Overdosage: If you think you have taken too much of this medicine contact a poison control center or emergency room at once. NOTE: This medicine is only for you. Do not share this medicine with others. What if I miss a dose? It is important not to miss a dose.  Call your doctor or health care professional if you are unable to keep an appointment. Depot injections: Depot injections are given either once-monthly, every 12 weeks, every 16 weeks, or every 24 weeks depending on the product you are prescribed. The product you are prescribed will be based on if you are male or male, and your condition. Make sure you understand your product and dosing. What may interact with this medicine? Do not take this medicine with any of the following medications: -chasteberry This medicine may also interact with the following medications: -herbal or dietary supplements, like black cohosh or DHEA -male hormones, like estrogens or progestins and birth control pills, patches, rings, or injections -male hormones, like testosterone This list may not describe all possible interactions. Give your health care provider a list of all the medicines, herbs, non-prescription drugs, or dietary supplements you use. Also tell them if you smoke, drink alcohol, or use illegal drugs. Some items may interact with your medicine. What should I watch for while using this medicine? Visit your doctor or health care professional for regular checks on your progress. During the first weeks of treatment, your symptoms may get worse, but then will improve as you continue your treatment. You may get hot flashes, increased bone pain, increased difficulty passing urine, or an aggravation of nerve symptoms. Discuss these effects with your doctor or health care professional, some of them may improve with continued use of this medicine. Male patients may experience a menstrual cycle or spotting during the first months of therapy with this medicine. If this continues, contact your doctor or health care professional. What side effects may I notice from receiving this medicine? Side   effects that you should report to your doctor or health care professional as soon as possible: -allergic reactions like skin  rash, itching or hives, swelling of the face, lips, or tongue -breathing problems -chest pain -depression or memory disorders -pain in your legs or groin -pain at site where injected or implanted -seizures -severe headache -swelling of the feet and legs -suicidal thoughts or other mood changes -visual changes -vomiting Side effects that usually do not require medical attention (report to your doctor or health care professional if they continue or are bothersome): -breast swelling or tenderness -decrease in sex drive or performance -diarrhea -hot flashes -loss of appetite -muscle, joint, or bone pains -nausea -redness or irritation at site where injected or implanted -skin problems or acne This list may not describe all possible side effects. Call your doctor for medical advice about side effects. You may report side effects to FDA at 1-800-FDA-1088. Where should I keep my medicine? This drug is given in a hospital or clinic and will not be stored at home. NOTE: This sheet is a summary. It may not cover all possible information. If you have questions about this medicine, talk to your doctor, pharmacist, or health care provider.  2018 Elsevier/Gold Standard (2015-06-24 09:45:53)  

## 2017-11-06 NOTE — Progress Notes (Signed)
  [facility] OFFICE PROGRESS NOTE   Diagnosis: Prostate cancer  INTERVAL HISTORY:   Mr. Kerce returns missing a follow-up visit in August.  He last had a Lupron injection 06/19/2017.  He overall feels well.  He denies pain.  His appetite varies.  He reports weight overall is stable.  About a month ago he noted some itching at the base of the penis.  This has improved.  Objective:  Vital signs in last 24 hours:  Blood pressure (!) 170/58, pulse 62, temperature 97.8 F (36.6 C), temperature source Oral, resp. rate 17, height 5\' 7"  (1.702 m), weight 159 lb 14.4 oz (72.5 kg), SpO2 100 %.    HEENT: No thrush or ulcers. Lymphatics: No palpable cervical, supraclavicular, axillary or inguinal lymph nodes. Resp: Lungs clear bilaterally. Cardio: Regular rate and rhythm. GI: Abdomen soft and nontender.  No hepatomegaly. Vascular: No leg edema. Skin: Small resolving area of folliculitis left lower pubic region.   Lab Results:  Lab Results  Component Value Date   WBC 6.0 11/06/2017   HGB 10.1 (L) 11/06/2017   HCT 33.7 (L) 11/06/2017   MCV 86.6 11/06/2017   PLT 195 11/06/2017   NEUTROABS 4.4 11/06/2017    Imaging:  No results found.  Medications: I have reviewed the patient's current medications.  Assessment/Plan: 1. Prostate cancer-metastatic, hormone sensitive ? Initiation of Lupron/case andCasodex2018,followed byevery Lupron-six-month dose given 03/27/2016 ? CT abdomen/pelvis 02/29/2016-enlarged prostate, extensive abdominal/retroperitoneal/pelvic adenopathy ? 03/06/2016 PSA 1590 ? Bone scan 03/14/2016-negative for metastatic disease ? 07/03/2016 PSA 1.96 ? 70-month Lupron given 12/11/2016, 03/05/2017, 06/19/2017, 11/06/2017 2.History of coronary artery disease 3.Diabetes 4.Peripheral vascular disease 5.Anemia-likely secondary to renal insufficiency and metastatic prostate cancer 6.Chronic renal failure 7.COPD 8.Dementia 9.    Nocturia  Disposition: Mr. Counterman appears unchanged.  He will continue every 25-month Lupron.  We will follow-up on the PSA from today.  We reviewed the CBC from today.  He has stable chronic anemia.  He will return for lab, follow-up and Lupron in 3 months.  He will contact the office in the interim with any problems.    Ned Card ANP/GNP-BC   11/06/2017  2:56 PM

## 2017-11-07 ENCOUNTER — Telehealth: Payer: Self-pay | Admitting: Emergency Medicine

## 2017-11-07 LAB — PROSTATE-SPECIFIC AG, SERUM (LABCORP): PROSTATE SPECIFIC AG, SERUM: 0.2 ng/mL (ref 0.0–4.0)

## 2017-11-07 NOTE — Telephone Encounter (Signed)
-----   Message from Owens Shark, NP sent at 11/07/2017  8:41 AM EDT ----- Please let his daughter know the PSA is stable in normal range.  Follow-up as scheduled.  Also let her know we are forwarding a copy of yesterday's labs including renal function to his PCP and nephrologist.

## 2018-01-03 DIAGNOSIS — E559 Vitamin D deficiency, unspecified: Secondary | ICD-10-CM | POA: Diagnosis not present

## 2018-01-03 DIAGNOSIS — E119 Type 2 diabetes mellitus without complications: Secondary | ICD-10-CM | POA: Diagnosis not present

## 2018-01-03 DIAGNOSIS — N390 Urinary tract infection, site not specified: Secondary | ICD-10-CM | POA: Diagnosis not present

## 2018-01-03 DIAGNOSIS — Z0000 Encounter for general adult medical examination without abnormal findings: Secondary | ICD-10-CM | POA: Diagnosis not present

## 2018-01-10 ENCOUNTER — Other Ambulatory Visit: Payer: Self-pay | Admitting: Internal Medicine

## 2018-01-28 ENCOUNTER — Other Ambulatory Visit: Payer: Self-pay | Admitting: Internal Medicine

## 2018-01-29 DIAGNOSIS — Z0000 Encounter for general adult medical examination without abnormal findings: Secondary | ICD-10-CM | POA: Diagnosis not present

## 2018-01-29 DIAGNOSIS — E785 Hyperlipidemia, unspecified: Secondary | ICD-10-CM | POA: Diagnosis not present

## 2018-01-30 ENCOUNTER — Other Ambulatory Visit: Payer: Self-pay | Admitting: Internal Medicine

## 2018-02-01 DIAGNOSIS — Z89422 Acquired absence of other left toe(s): Secondary | ICD-10-CM | POA: Diagnosis not present

## 2018-02-01 DIAGNOSIS — E1142 Type 2 diabetes mellitus with diabetic polyneuropathy: Secondary | ICD-10-CM | POA: Diagnosis not present

## 2018-02-01 DIAGNOSIS — N184 Chronic kidney disease, stage 4 (severe): Secondary | ICD-10-CM | POA: Diagnosis not present

## 2018-02-01 DIAGNOSIS — I259 Chronic ischemic heart disease, unspecified: Secondary | ICD-10-CM | POA: Diagnosis not present

## 2018-02-01 DIAGNOSIS — E1151 Type 2 diabetes mellitus with diabetic peripheral angiopathy without gangrene: Secondary | ICD-10-CM | POA: Diagnosis not present

## 2018-02-01 DIAGNOSIS — I25111 Atherosclerotic heart disease of native coronary artery with angina pectoris with documented spasm: Secondary | ICD-10-CM | POA: Diagnosis not present

## 2018-02-01 DIAGNOSIS — N2581 Secondary hyperparathyroidism of renal origin: Secondary | ICD-10-CM | POA: Diagnosis not present

## 2018-02-01 DIAGNOSIS — Z89431 Acquired absence of right foot: Secondary | ICD-10-CM | POA: Diagnosis not present

## 2018-02-01 DIAGNOSIS — Z89411 Acquired absence of right great toe: Secondary | ICD-10-CM | POA: Diagnosis not present

## 2018-02-01 DIAGNOSIS — D631 Anemia in chronic kidney disease: Secondary | ICD-10-CM | POA: Diagnosis not present

## 2018-02-01 DIAGNOSIS — Z951 Presence of aortocoronary bypass graft: Secondary | ICD-10-CM | POA: Diagnosis not present

## 2018-02-01 DIAGNOSIS — E559 Vitamin D deficiency, unspecified: Secondary | ICD-10-CM | POA: Diagnosis not present

## 2018-02-01 DIAGNOSIS — I5022 Chronic systolic (congestive) heart failure: Secondary | ICD-10-CM | POA: Diagnosis not present

## 2018-02-01 DIAGNOSIS — Z7982 Long term (current) use of aspirin: Secondary | ICD-10-CM | POA: Diagnosis not present

## 2018-02-01 DIAGNOSIS — M880 Osteitis deformans of skull: Secondary | ICD-10-CM | POA: Diagnosis not present

## 2018-02-01 DIAGNOSIS — E785 Hyperlipidemia, unspecified: Secondary | ICD-10-CM | POA: Diagnosis not present

## 2018-02-01 DIAGNOSIS — Z794 Long term (current) use of insulin: Secondary | ICD-10-CM | POA: Diagnosis not present

## 2018-02-01 DIAGNOSIS — J449 Chronic obstructive pulmonary disease, unspecified: Secondary | ICD-10-CM | POA: Diagnosis not present

## 2018-02-01 DIAGNOSIS — I495 Sick sinus syndrome: Secondary | ICD-10-CM | POA: Diagnosis not present

## 2018-02-01 DIAGNOSIS — E1122 Type 2 diabetes mellitus with diabetic chronic kidney disease: Secondary | ICD-10-CM | POA: Diagnosis not present

## 2018-02-01 DIAGNOSIS — I130 Hypertensive heart and chronic kidney disease with heart failure and stage 1 through stage 4 chronic kidney disease, or unspecified chronic kidney disease: Secondary | ICD-10-CM | POA: Diagnosis not present

## 2018-02-01 DIAGNOSIS — Z950 Presence of cardiac pacemaker: Secondary | ICD-10-CM | POA: Diagnosis not present

## 2018-02-01 DIAGNOSIS — E538 Deficiency of other specified B group vitamins: Secondary | ICD-10-CM | POA: Diagnosis not present

## 2018-02-04 ENCOUNTER — Telehealth: Payer: Self-pay | Admitting: *Deleted

## 2018-02-04 NOTE — Telephone Encounter (Addendum)
Daughter left VM requesting a return call regarding his appointment for tomorrow. Attempted to return her call with no answer. Left VM to call back. Daughter called again and left VM requesting return call. Called back and got her voice mail again.

## 2018-02-05 ENCOUNTER — Telehealth: Payer: Self-pay | Admitting: Oncology

## 2018-02-05 ENCOUNTER — Inpatient Hospital Stay: Payer: Medicare Other

## 2018-02-05 ENCOUNTER — Inpatient Hospital Stay: Payer: Medicare Other | Admitting: Oncology

## 2018-02-05 NOTE — Telephone Encounter (Signed)
Spoke with dtr re appointments for 1/22 - date per dtr.

## 2018-02-05 NOTE — Telephone Encounter (Signed)
Daughter called again requesting reschedule of lab/ov/injection. Sent high priority scheduling message with request.

## 2018-02-13 ENCOUNTER — Inpatient Hospital Stay: Payer: Medicare Other

## 2018-02-13 ENCOUNTER — Encounter: Payer: Self-pay | Admitting: Nurse Practitioner

## 2018-02-13 ENCOUNTER — Inpatient Hospital Stay: Payer: Medicare Other | Attending: Oncology

## 2018-02-13 ENCOUNTER — Inpatient Hospital Stay (HOSPITAL_BASED_OUTPATIENT_CLINIC_OR_DEPARTMENT_OTHER): Payer: Medicare Other | Admitting: Nurse Practitioner

## 2018-02-13 ENCOUNTER — Telehealth: Payer: Self-pay

## 2018-02-13 VITALS — BP 143/52 | HR 67 | Temp 98.2°F | Resp 17 | Ht 67.0 in | Wt 155.9 lb

## 2018-02-13 DIAGNOSIS — R232 Flushing: Secondary | ICD-10-CM

## 2018-02-13 DIAGNOSIS — N189 Chronic kidney disease, unspecified: Secondary | ICD-10-CM | POA: Diagnosis not present

## 2018-02-13 DIAGNOSIS — R351 Nocturia: Secondary | ICD-10-CM | POA: Diagnosis not present

## 2018-02-13 DIAGNOSIS — Z79818 Long term (current) use of other agents affecting estrogen receptors and estrogen levels: Secondary | ICD-10-CM

## 2018-02-13 DIAGNOSIS — C775 Secondary and unspecified malignant neoplasm of intrapelvic lymph nodes: Secondary | ICD-10-CM

## 2018-02-13 DIAGNOSIS — D649 Anemia, unspecified: Secondary | ICD-10-CM | POA: Insufficient documentation

## 2018-02-13 DIAGNOSIS — C61 Malignant neoplasm of prostate: Secondary | ICD-10-CM

## 2018-02-13 DIAGNOSIS — F0390 Unspecified dementia without behavioral disturbance: Secondary | ICD-10-CM | POA: Diagnosis not present

## 2018-02-13 DIAGNOSIS — N401 Enlarged prostate with lower urinary tract symptoms: Secondary | ICD-10-CM | POA: Insufficient documentation

## 2018-02-13 DIAGNOSIS — J449 Chronic obstructive pulmonary disease, unspecified: Secondary | ICD-10-CM | POA: Insufficient documentation

## 2018-02-13 DIAGNOSIS — I739 Peripheral vascular disease, unspecified: Secondary | ICD-10-CM | POA: Insufficient documentation

## 2018-02-13 DIAGNOSIS — I2510 Atherosclerotic heart disease of native coronary artery without angina pectoris: Secondary | ICD-10-CM

## 2018-02-13 DIAGNOSIS — E119 Type 2 diabetes mellitus without complications: Secondary | ICD-10-CM

## 2018-02-13 LAB — CBC WITH DIFFERENTIAL (CANCER CENTER ONLY)
Abs Immature Granulocytes: 0.01 10*3/uL (ref 0.00–0.07)
Basophils Absolute: 0 10*3/uL (ref 0.0–0.1)
Basophils Relative: 1 %
Eosinophils Absolute: 0.1 10*3/uL (ref 0.0–0.5)
Eosinophils Relative: 2 %
HEMATOCRIT: 30.2 % — AB (ref 39.0–52.0)
Hemoglobin: 9 g/dL — ABNORMAL LOW (ref 13.0–17.0)
Immature Granulocytes: 0 %
Lymphocytes Relative: 18 %
Lymphs Abs: 0.9 10*3/uL (ref 0.7–4.0)
MCH: 25.6 pg — ABNORMAL LOW (ref 26.0–34.0)
MCHC: 29.8 g/dL — ABNORMAL LOW (ref 30.0–36.0)
MCV: 85.8 fL (ref 80.0–100.0)
Monocytes Absolute: 0.5 10*3/uL (ref 0.1–1.0)
Monocytes Relative: 10 %
NEUTROS PCT: 69 %
Neutro Abs: 3.5 10*3/uL (ref 1.7–7.7)
Platelet Count: 190 10*3/uL (ref 150–400)
RBC: 3.52 MIL/uL — ABNORMAL LOW (ref 4.22–5.81)
RDW: 15.6 % — ABNORMAL HIGH (ref 11.5–15.5)
WBC Count: 5 10*3/uL (ref 4.0–10.5)
nRBC: 0 % (ref 0.0–0.2)

## 2018-02-13 LAB — CMP (CANCER CENTER ONLY)
ALT: 23 U/L (ref 0–44)
AST: 25 U/L (ref 15–41)
Albumin: 3.3 g/dL — ABNORMAL LOW (ref 3.5–5.0)
Alkaline Phosphatase: 81 U/L (ref 38–126)
Anion gap: 11 (ref 5–15)
BUN: 83 mg/dL — ABNORMAL HIGH (ref 8–23)
CO2: 21 mmol/L — ABNORMAL LOW (ref 22–32)
Calcium: 9.7 mg/dL (ref 8.9–10.3)
Chloride: 107 mmol/L (ref 98–111)
Creatinine: 4.4 mg/dL (ref 0.61–1.24)
GFR, Est AFR Am: 13 mL/min — ABNORMAL LOW (ref >60)
GFR, Estimated: 11 mL/min — ABNORMAL LOW (ref >60)
Glucose, Bld: 135 mg/dL — ABNORMAL HIGH (ref 70–99)
Potassium: 5.3 mmol/L — ABNORMAL HIGH (ref 3.5–5.1)
Sodium: 139 mmol/L (ref 135–145)
Total Bilirubin: 0.3 mg/dL (ref 0.3–1.2)
Total Protein: 7.8 g/dL (ref 6.5–8.1)

## 2018-02-13 MED ORDER — LEUPROLIDE ACETATE (3 MONTH) 22.5 MG IM KIT
22.5000 mg | PACK | Freq: Once | INTRAMUSCULAR | Status: AC
  Administered 2018-02-13: 22.5 mg via INTRAMUSCULAR

## 2018-02-13 NOTE — Patient Instructions (Signed)

## 2018-02-13 NOTE — Telephone Encounter (Signed)
TC from lab to report critical lab. Creatinine 4.40. Lisa aware.

## 2018-02-13 NOTE — Progress Notes (Signed)
  [facility] OFFICE PROGRESS NOTE   Diagnosis: Prostate cancer  INTERVAL HISTORY:   Colin [name] returns for follow-up.  He last received Lupron 11/06/2017.  No consistent areas of pain.  He has periodic hot flashes.  He complains of trouble falling asleep.  Appetite varies.  Objective:  Vital signs in last 24 hours:  Blood pressure (!) 143/52, pulse 67, temperature 98.2 F (36.8 C), temperature source Oral, resp. rate 17, height 5\' 7"  (1.702 m), weight 155 lb 14.4 oz (70.7 kg), SpO2 100 %.    HEENT: No thrush or ulcers. Lymphatics: No palpable cervical or supraclavicular lymph nodes. Resp: Lungs clear bilaterally. Cardio: Regular rate and rhythm. GI: Abdomen soft and nontender.  No hepatomegaly. Vascular: No leg edema.  Lab Results:  Lab Results  Component Value Date   WBC 5.0 02/13/2018   HGB 9.0 (L) 02/13/2018   HCT 30.2 (L) 02/13/2018   MCV 85.8 02/13/2018   PLT 190 02/13/2018   NEUTROABS 3.5 02/13/2018    Imaging:  No results found.  Medications: I have reviewed the patient's current medications.  Assessment/Plan: 1. Prostate cancer-metastatic, hormone sensitive ? Initiation of Lupron/case andCasodex2018,followed byevery Lupron-six-month dose given 03/27/2016 ? CT abdomen/pelvis 02/29/2016-enlarged prostate, extensive abdominal/retroperitoneal/pelvic adenopathy ? 03/06/2016 PSA 1590 ? Bone scan 03/14/2016-negative for metastatic disease ? 07/03/2016 PSA 1.96 ? 29-month Lupron given 12/11/2016, 03/05/2017, 06/19/2017, 11/06/2017 ? PSA 11/06/2017- 0.2 2.History of coronary artery disease 3.Diabetes 4.Peripheral vascular disease 5.Anemia-likely secondary to renal insufficiency and metastatic prostate cancer 6.Chronic renal failure 7.COPD 8.Dementia 9.Nocturia  Disposition: Colin [name] appears unchanged.  Plan to continue every 49-month Lupron.  We will follow-up on the PSA from today.  He will return for lab, follow-up and  Lupron in 3 months.  He will contact the office in the interim with any problems.    Ned Card ANP/GNP-BC   02/13/2018  4:01 PM

## 2018-02-14 ENCOUNTER — Telehealth: Payer: Self-pay

## 2018-02-14 ENCOUNTER — Telehealth: Payer: Self-pay | Admitting: Oncology

## 2018-02-14 DIAGNOSIS — E559 Vitamin D deficiency, unspecified: Secondary | ICD-10-CM | POA: Diagnosis not present

## 2018-02-14 DIAGNOSIS — E1122 Type 2 diabetes mellitus with diabetic chronic kidney disease: Secondary | ICD-10-CM | POA: Diagnosis not present

## 2018-02-14 DIAGNOSIS — Z951 Presence of aortocoronary bypass graft: Secondary | ICD-10-CM | POA: Diagnosis not present

## 2018-02-14 DIAGNOSIS — Z89422 Acquired absence of other left toe(s): Secondary | ICD-10-CM | POA: Diagnosis not present

## 2018-02-14 DIAGNOSIS — E1142 Type 2 diabetes mellitus with diabetic polyneuropathy: Secondary | ICD-10-CM | POA: Diagnosis not present

## 2018-02-14 DIAGNOSIS — Z7982 Long term (current) use of aspirin: Secondary | ICD-10-CM | POA: Diagnosis not present

## 2018-02-14 DIAGNOSIS — N2581 Secondary hyperparathyroidism of renal origin: Secondary | ICD-10-CM | POA: Diagnosis not present

## 2018-02-14 DIAGNOSIS — E785 Hyperlipidemia, unspecified: Secondary | ICD-10-CM | POA: Diagnosis not present

## 2018-02-14 DIAGNOSIS — Z89431 Acquired absence of right foot: Secondary | ICD-10-CM | POA: Diagnosis not present

## 2018-02-14 DIAGNOSIS — E538 Deficiency of other specified B group vitamins: Secondary | ICD-10-CM | POA: Diagnosis not present

## 2018-02-14 DIAGNOSIS — D631 Anemia in chronic kidney disease: Secondary | ICD-10-CM | POA: Diagnosis not present

## 2018-02-14 DIAGNOSIS — Z794 Long term (current) use of insulin: Secondary | ICD-10-CM | POA: Diagnosis not present

## 2018-02-14 DIAGNOSIS — Z89411 Acquired absence of right great toe: Secondary | ICD-10-CM | POA: Diagnosis not present

## 2018-02-14 DIAGNOSIS — N184 Chronic kidney disease, stage 4 (severe): Secondary | ICD-10-CM | POA: Diagnosis not present

## 2018-02-14 DIAGNOSIS — I25111 Atherosclerotic heart disease of native coronary artery with angina pectoris with documented spasm: Secondary | ICD-10-CM | POA: Diagnosis not present

## 2018-02-14 DIAGNOSIS — M880 Osteitis deformans of skull: Secondary | ICD-10-CM | POA: Diagnosis not present

## 2018-02-14 DIAGNOSIS — J449 Chronic obstructive pulmonary disease, unspecified: Secondary | ICD-10-CM | POA: Diagnosis not present

## 2018-02-14 DIAGNOSIS — I259 Chronic ischemic heart disease, unspecified: Secondary | ICD-10-CM | POA: Diagnosis not present

## 2018-02-14 DIAGNOSIS — I5022 Chronic systolic (congestive) heart failure: Secondary | ICD-10-CM | POA: Diagnosis not present

## 2018-02-14 DIAGNOSIS — Z950 Presence of cardiac pacemaker: Secondary | ICD-10-CM | POA: Diagnosis not present

## 2018-02-14 DIAGNOSIS — I130 Hypertensive heart and chronic kidney disease with heart failure and stage 1 through stage 4 chronic kidney disease, or unspecified chronic kidney disease: Secondary | ICD-10-CM | POA: Diagnosis not present

## 2018-02-14 DIAGNOSIS — E1151 Type 2 diabetes mellitus with diabetic peripheral angiopathy without gangrene: Secondary | ICD-10-CM | POA: Diagnosis not present

## 2018-02-14 DIAGNOSIS — I495 Sick sinus syndrome: Secondary | ICD-10-CM | POA: Diagnosis not present

## 2018-02-14 LAB — PROSTATE-SPECIFIC AG, SERUM (LABCORP): Prostate Specific Ag, Serum: 0.3 ng/mL (ref 0.0–4.0)

## 2018-02-14 NOTE — Telephone Encounter (Signed)
Scheduled appt per 1/22 los - sent reminder letter in the mail -unable to reach patient -

## 2018-02-14 NOTE — Telephone Encounter (Signed)
Dr. Jason Nest assistant also aware of Lisa's question about pt maybe needing a renal ultrasound.

## 2018-02-14 NOTE — Telephone Encounter (Signed)
TC per Lattie Haw to Dr. Jason Nest office at  [facility] [phone]). Left message for Dr. Jason Nest assistant to return call to [facility] [phone]).

## 2018-02-14 NOTE — Telephone Encounter (Signed)
Received return TC from [facility] and spoke with Dr. Jason Nest assistant. Per Lattie Haw I informed him of recent abnormal lab, creatinine 4.40. He stated that they are able to view epic and that he would print off labs and give them to Dr. Justin Mend. Also stated that pt has an appointment scheduled for next week and that they will follow up with everything then.

## 2018-02-18 DIAGNOSIS — I5022 Chronic systolic (congestive) heart failure: Secondary | ICD-10-CM | POA: Diagnosis not present

## 2018-02-18 DIAGNOSIS — M880 Osteitis deformans of skull: Secondary | ICD-10-CM | POA: Diagnosis not present

## 2018-02-18 DIAGNOSIS — I259 Chronic ischemic heart disease, unspecified: Secondary | ICD-10-CM | POA: Diagnosis not present

## 2018-02-18 DIAGNOSIS — E559 Vitamin D deficiency, unspecified: Secondary | ICD-10-CM | POA: Diagnosis not present

## 2018-02-18 DIAGNOSIS — I25111 Atherosclerotic heart disease of native coronary artery with angina pectoris with documented spasm: Secondary | ICD-10-CM | POA: Diagnosis not present

## 2018-02-18 DIAGNOSIS — Z951 Presence of aortocoronary bypass graft: Secondary | ICD-10-CM | POA: Diagnosis not present

## 2018-02-18 DIAGNOSIS — Z7982 Long term (current) use of aspirin: Secondary | ICD-10-CM | POA: Diagnosis not present

## 2018-02-18 DIAGNOSIS — E785 Hyperlipidemia, unspecified: Secondary | ICD-10-CM | POA: Diagnosis not present

## 2018-02-18 DIAGNOSIS — D631 Anemia in chronic kidney disease: Secondary | ICD-10-CM | POA: Diagnosis not present

## 2018-02-18 DIAGNOSIS — I130 Hypertensive heart and chronic kidney disease with heart failure and stage 1 through stage 4 chronic kidney disease, or unspecified chronic kidney disease: Secondary | ICD-10-CM | POA: Diagnosis not present

## 2018-02-18 DIAGNOSIS — I495 Sick sinus syndrome: Secondary | ICD-10-CM | POA: Diagnosis not present

## 2018-02-18 DIAGNOSIS — E538 Deficiency of other specified B group vitamins: Secondary | ICD-10-CM | POA: Diagnosis not present

## 2018-02-18 DIAGNOSIS — E1151 Type 2 diabetes mellitus with diabetic peripheral angiopathy without gangrene: Secondary | ICD-10-CM | POA: Diagnosis not present

## 2018-02-18 DIAGNOSIS — Z950 Presence of cardiac pacemaker: Secondary | ICD-10-CM | POA: Diagnosis not present

## 2018-02-18 DIAGNOSIS — N184 Chronic kidney disease, stage 4 (severe): Secondary | ICD-10-CM | POA: Diagnosis not present

## 2018-02-18 DIAGNOSIS — Z794 Long term (current) use of insulin: Secondary | ICD-10-CM | POA: Diagnosis not present

## 2018-02-18 DIAGNOSIS — E1122 Type 2 diabetes mellitus with diabetic chronic kidney disease: Secondary | ICD-10-CM | POA: Diagnosis not present

## 2018-02-18 DIAGNOSIS — N2581 Secondary hyperparathyroidism of renal origin: Secondary | ICD-10-CM | POA: Diagnosis not present

## 2018-02-18 DIAGNOSIS — Z89411 Acquired absence of right great toe: Secondary | ICD-10-CM | POA: Diagnosis not present

## 2018-02-18 DIAGNOSIS — Z89431 Acquired absence of right foot: Secondary | ICD-10-CM | POA: Diagnosis not present

## 2018-02-18 DIAGNOSIS — E1142 Type 2 diabetes mellitus with diabetic polyneuropathy: Secondary | ICD-10-CM | POA: Diagnosis not present

## 2018-02-18 DIAGNOSIS — Z89422 Acquired absence of other left toe(s): Secondary | ICD-10-CM | POA: Diagnosis not present

## 2018-02-18 DIAGNOSIS — J449 Chronic obstructive pulmonary disease, unspecified: Secondary | ICD-10-CM | POA: Diagnosis not present

## 2018-02-19 DIAGNOSIS — M880 Osteitis deformans of skull: Secondary | ICD-10-CM | POA: Diagnosis not present

## 2018-02-19 DIAGNOSIS — E538 Deficiency of other specified B group vitamins: Secondary | ICD-10-CM | POA: Diagnosis not present

## 2018-02-19 DIAGNOSIS — Z794 Long term (current) use of insulin: Secondary | ICD-10-CM | POA: Diagnosis not present

## 2018-02-19 DIAGNOSIS — I25111 Atherosclerotic heart disease of native coronary artery with angina pectoris with documented spasm: Secondary | ICD-10-CM | POA: Diagnosis not present

## 2018-02-19 DIAGNOSIS — D631 Anemia in chronic kidney disease: Secondary | ICD-10-CM | POA: Diagnosis not present

## 2018-02-19 DIAGNOSIS — Z951 Presence of aortocoronary bypass graft: Secondary | ICD-10-CM | POA: Diagnosis not present

## 2018-02-19 DIAGNOSIS — N184 Chronic kidney disease, stage 4 (severe): Secondary | ICD-10-CM | POA: Diagnosis not present

## 2018-02-19 DIAGNOSIS — J449 Chronic obstructive pulmonary disease, unspecified: Secondary | ICD-10-CM | POA: Diagnosis not present

## 2018-02-19 DIAGNOSIS — E785 Hyperlipidemia, unspecified: Secondary | ICD-10-CM | POA: Diagnosis not present

## 2018-02-19 DIAGNOSIS — E1122 Type 2 diabetes mellitus with diabetic chronic kidney disease: Secondary | ICD-10-CM | POA: Diagnosis not present

## 2018-02-19 DIAGNOSIS — E1151 Type 2 diabetes mellitus with diabetic peripheral angiopathy without gangrene: Secondary | ICD-10-CM | POA: Diagnosis not present

## 2018-02-19 DIAGNOSIS — I5022 Chronic systolic (congestive) heart failure: Secondary | ICD-10-CM | POA: Diagnosis not present

## 2018-02-19 DIAGNOSIS — N2581 Secondary hyperparathyroidism of renal origin: Secondary | ICD-10-CM | POA: Diagnosis not present

## 2018-02-19 DIAGNOSIS — I259 Chronic ischemic heart disease, unspecified: Secondary | ICD-10-CM | POA: Diagnosis not present

## 2018-02-19 DIAGNOSIS — E559 Vitamin D deficiency, unspecified: Secondary | ICD-10-CM | POA: Diagnosis not present

## 2018-02-19 DIAGNOSIS — Z89431 Acquired absence of right foot: Secondary | ICD-10-CM | POA: Diagnosis not present

## 2018-02-19 DIAGNOSIS — I130 Hypertensive heart and chronic kidney disease with heart failure and stage 1 through stage 4 chronic kidney disease, or unspecified chronic kidney disease: Secondary | ICD-10-CM | POA: Diagnosis not present

## 2018-02-19 DIAGNOSIS — E1142 Type 2 diabetes mellitus with diabetic polyneuropathy: Secondary | ICD-10-CM | POA: Diagnosis not present

## 2018-02-19 DIAGNOSIS — Z7982 Long term (current) use of aspirin: Secondary | ICD-10-CM | POA: Diagnosis not present

## 2018-02-19 DIAGNOSIS — Z89411 Acquired absence of right great toe: Secondary | ICD-10-CM | POA: Diagnosis not present

## 2018-02-19 DIAGNOSIS — Z950 Presence of cardiac pacemaker: Secondary | ICD-10-CM | POA: Diagnosis not present

## 2018-02-19 DIAGNOSIS — I495 Sick sinus syndrome: Secondary | ICD-10-CM | POA: Diagnosis not present

## 2018-02-19 DIAGNOSIS — Z89422 Acquired absence of other left toe(s): Secondary | ICD-10-CM | POA: Diagnosis not present

## 2018-02-22 DIAGNOSIS — Z89431 Acquired absence of right foot: Secondary | ICD-10-CM | POA: Diagnosis not present

## 2018-02-22 DIAGNOSIS — N2581 Secondary hyperparathyroidism of renal origin: Secondary | ICD-10-CM | POA: Diagnosis not present

## 2018-02-22 DIAGNOSIS — Z794 Long term (current) use of insulin: Secondary | ICD-10-CM | POA: Diagnosis not present

## 2018-02-22 DIAGNOSIS — Z89422 Acquired absence of other left toe(s): Secondary | ICD-10-CM | POA: Diagnosis not present

## 2018-02-22 DIAGNOSIS — Z950 Presence of cardiac pacemaker: Secondary | ICD-10-CM | POA: Diagnosis not present

## 2018-02-22 DIAGNOSIS — J449 Chronic obstructive pulmonary disease, unspecified: Secondary | ICD-10-CM | POA: Diagnosis not present

## 2018-02-22 DIAGNOSIS — E559 Vitamin D deficiency, unspecified: Secondary | ICD-10-CM | POA: Diagnosis not present

## 2018-02-22 DIAGNOSIS — Z7982 Long term (current) use of aspirin: Secondary | ICD-10-CM | POA: Diagnosis not present

## 2018-02-22 DIAGNOSIS — D631 Anemia in chronic kidney disease: Secondary | ICD-10-CM | POA: Diagnosis not present

## 2018-02-22 DIAGNOSIS — Z89411 Acquired absence of right great toe: Secondary | ICD-10-CM | POA: Diagnosis not present

## 2018-02-22 DIAGNOSIS — M880 Osteitis deformans of skull: Secondary | ICD-10-CM | POA: Diagnosis not present

## 2018-02-22 DIAGNOSIS — E1142 Type 2 diabetes mellitus with diabetic polyneuropathy: Secondary | ICD-10-CM | POA: Diagnosis not present

## 2018-02-22 DIAGNOSIS — E785 Hyperlipidemia, unspecified: Secondary | ICD-10-CM | POA: Diagnosis not present

## 2018-02-22 DIAGNOSIS — Z951 Presence of aortocoronary bypass graft: Secondary | ICD-10-CM | POA: Diagnosis not present

## 2018-02-22 DIAGNOSIS — I495 Sick sinus syndrome: Secondary | ICD-10-CM | POA: Diagnosis not present

## 2018-02-22 DIAGNOSIS — I259 Chronic ischemic heart disease, unspecified: Secondary | ICD-10-CM | POA: Diagnosis not present

## 2018-02-22 DIAGNOSIS — I25111 Atherosclerotic heart disease of native coronary artery with angina pectoris with documented spasm: Secondary | ICD-10-CM | POA: Diagnosis not present

## 2018-02-22 DIAGNOSIS — I5022 Chronic systolic (congestive) heart failure: Secondary | ICD-10-CM | POA: Diagnosis not present

## 2018-02-22 DIAGNOSIS — N184 Chronic kidney disease, stage 4 (severe): Secondary | ICD-10-CM | POA: Diagnosis not present

## 2018-02-22 DIAGNOSIS — E1151 Type 2 diabetes mellitus with diabetic peripheral angiopathy without gangrene: Secondary | ICD-10-CM | POA: Diagnosis not present

## 2018-02-22 DIAGNOSIS — E538 Deficiency of other specified B group vitamins: Secondary | ICD-10-CM | POA: Diagnosis not present

## 2018-02-22 DIAGNOSIS — E1122 Type 2 diabetes mellitus with diabetic chronic kidney disease: Secondary | ICD-10-CM | POA: Diagnosis not present

## 2018-02-22 DIAGNOSIS — I130 Hypertensive heart and chronic kidney disease with heart failure and stage 1 through stage 4 chronic kidney disease, or unspecified chronic kidney disease: Secondary | ICD-10-CM | POA: Diagnosis not present

## 2018-02-26 DIAGNOSIS — Z950 Presence of cardiac pacemaker: Secondary | ICD-10-CM | POA: Diagnosis not present

## 2018-02-26 DIAGNOSIS — Z794 Long term (current) use of insulin: Secondary | ICD-10-CM | POA: Diagnosis not present

## 2018-02-26 DIAGNOSIS — I130 Hypertensive heart and chronic kidney disease with heart failure and stage 1 through stage 4 chronic kidney disease, or unspecified chronic kidney disease: Secondary | ICD-10-CM | POA: Diagnosis not present

## 2018-02-26 DIAGNOSIS — M880 Osteitis deformans of skull: Secondary | ICD-10-CM | POA: Diagnosis not present

## 2018-02-26 DIAGNOSIS — D631 Anemia in chronic kidney disease: Secondary | ICD-10-CM | POA: Diagnosis not present

## 2018-02-26 DIAGNOSIS — E559 Vitamin D deficiency, unspecified: Secondary | ICD-10-CM | POA: Diagnosis not present

## 2018-02-26 DIAGNOSIS — N2581 Secondary hyperparathyroidism of renal origin: Secondary | ICD-10-CM | POA: Diagnosis not present

## 2018-02-26 DIAGNOSIS — Z951 Presence of aortocoronary bypass graft: Secondary | ICD-10-CM | POA: Diagnosis not present

## 2018-02-26 DIAGNOSIS — N184 Chronic kidney disease, stage 4 (severe): Secondary | ICD-10-CM | POA: Diagnosis not present

## 2018-02-26 DIAGNOSIS — Z89431 Acquired absence of right foot: Secondary | ICD-10-CM | POA: Diagnosis not present

## 2018-02-26 DIAGNOSIS — E1151 Type 2 diabetes mellitus with diabetic peripheral angiopathy without gangrene: Secondary | ICD-10-CM | POA: Diagnosis not present

## 2018-02-26 DIAGNOSIS — Z7982 Long term (current) use of aspirin: Secondary | ICD-10-CM | POA: Diagnosis not present

## 2018-02-26 DIAGNOSIS — E1142 Type 2 diabetes mellitus with diabetic polyneuropathy: Secondary | ICD-10-CM | POA: Diagnosis not present

## 2018-02-26 DIAGNOSIS — I259 Chronic ischemic heart disease, unspecified: Secondary | ICD-10-CM | POA: Diagnosis not present

## 2018-02-26 DIAGNOSIS — E538 Deficiency of other specified B group vitamins: Secondary | ICD-10-CM | POA: Diagnosis not present

## 2018-02-26 DIAGNOSIS — I25111 Atherosclerotic heart disease of native coronary artery with angina pectoris with documented spasm: Secondary | ICD-10-CM | POA: Diagnosis not present

## 2018-02-26 DIAGNOSIS — I5022 Chronic systolic (congestive) heart failure: Secondary | ICD-10-CM | POA: Diagnosis not present

## 2018-02-26 DIAGNOSIS — I495 Sick sinus syndrome: Secondary | ICD-10-CM | POA: Diagnosis not present

## 2018-02-26 DIAGNOSIS — E1122 Type 2 diabetes mellitus with diabetic chronic kidney disease: Secondary | ICD-10-CM | POA: Diagnosis not present

## 2018-02-26 DIAGNOSIS — Z89422 Acquired absence of other left toe(s): Secondary | ICD-10-CM | POA: Diagnosis not present

## 2018-02-26 DIAGNOSIS — E785 Hyperlipidemia, unspecified: Secondary | ICD-10-CM | POA: Diagnosis not present

## 2018-02-26 DIAGNOSIS — J449 Chronic obstructive pulmonary disease, unspecified: Secondary | ICD-10-CM | POA: Diagnosis not present

## 2018-02-26 DIAGNOSIS — Z89411 Acquired absence of right great toe: Secondary | ICD-10-CM | POA: Diagnosis not present

## 2018-02-27 DIAGNOSIS — Z89431 Acquired absence of right foot: Secondary | ICD-10-CM | POA: Diagnosis not present

## 2018-02-27 DIAGNOSIS — I130 Hypertensive heart and chronic kidney disease with heart failure and stage 1 through stage 4 chronic kidney disease, or unspecified chronic kidney disease: Secondary | ICD-10-CM | POA: Diagnosis not present

## 2018-02-27 DIAGNOSIS — Z794 Long term (current) use of insulin: Secondary | ICD-10-CM | POA: Diagnosis not present

## 2018-02-27 DIAGNOSIS — M880 Osteitis deformans of skull: Secondary | ICD-10-CM | POA: Diagnosis not present

## 2018-02-27 DIAGNOSIS — E1122 Type 2 diabetes mellitus with diabetic chronic kidney disease: Secondary | ICD-10-CM | POA: Diagnosis not present

## 2018-02-27 DIAGNOSIS — E785 Hyperlipidemia, unspecified: Secondary | ICD-10-CM | POA: Diagnosis not present

## 2018-02-27 DIAGNOSIS — I495 Sick sinus syndrome: Secondary | ICD-10-CM | POA: Diagnosis not present

## 2018-02-27 DIAGNOSIS — Z7982 Long term (current) use of aspirin: Secondary | ICD-10-CM | POA: Diagnosis not present

## 2018-02-27 DIAGNOSIS — E559 Vitamin D deficiency, unspecified: Secondary | ICD-10-CM | POA: Diagnosis not present

## 2018-02-27 DIAGNOSIS — J449 Chronic obstructive pulmonary disease, unspecified: Secondary | ICD-10-CM | POA: Diagnosis not present

## 2018-02-27 DIAGNOSIS — N2581 Secondary hyperparathyroidism of renal origin: Secondary | ICD-10-CM | POA: Diagnosis not present

## 2018-02-27 DIAGNOSIS — N184 Chronic kidney disease, stage 4 (severe): Secondary | ICD-10-CM | POA: Diagnosis not present

## 2018-02-27 DIAGNOSIS — I25111 Atherosclerotic heart disease of native coronary artery with angina pectoris with documented spasm: Secondary | ICD-10-CM | POA: Diagnosis not present

## 2018-02-27 DIAGNOSIS — Z951 Presence of aortocoronary bypass graft: Secondary | ICD-10-CM | POA: Diagnosis not present

## 2018-02-27 DIAGNOSIS — Z89411 Acquired absence of right great toe: Secondary | ICD-10-CM | POA: Diagnosis not present

## 2018-02-27 DIAGNOSIS — Z89422 Acquired absence of other left toe(s): Secondary | ICD-10-CM | POA: Diagnosis not present

## 2018-02-27 DIAGNOSIS — D631 Anemia in chronic kidney disease: Secondary | ICD-10-CM | POA: Diagnosis not present

## 2018-02-27 DIAGNOSIS — E1151 Type 2 diabetes mellitus with diabetic peripheral angiopathy without gangrene: Secondary | ICD-10-CM | POA: Diagnosis not present

## 2018-02-27 DIAGNOSIS — I5022 Chronic systolic (congestive) heart failure: Secondary | ICD-10-CM | POA: Diagnosis not present

## 2018-02-27 DIAGNOSIS — I259 Chronic ischemic heart disease, unspecified: Secondary | ICD-10-CM | POA: Diagnosis not present

## 2018-02-27 DIAGNOSIS — Z950 Presence of cardiac pacemaker: Secondary | ICD-10-CM | POA: Diagnosis not present

## 2018-02-27 DIAGNOSIS — E538 Deficiency of other specified B group vitamins: Secondary | ICD-10-CM | POA: Diagnosis not present

## 2018-02-27 DIAGNOSIS — E1142 Type 2 diabetes mellitus with diabetic polyneuropathy: Secondary | ICD-10-CM | POA: Diagnosis not present

## 2018-02-28 DIAGNOSIS — D631 Anemia in chronic kidney disease: Secondary | ICD-10-CM | POA: Diagnosis not present

## 2018-02-28 DIAGNOSIS — I495 Sick sinus syndrome: Secondary | ICD-10-CM | POA: Diagnosis not present

## 2018-02-28 DIAGNOSIS — Z950 Presence of cardiac pacemaker: Secondary | ICD-10-CM | POA: Diagnosis not present

## 2018-02-28 DIAGNOSIS — I25111 Atherosclerotic heart disease of native coronary artery with angina pectoris with documented spasm: Secondary | ICD-10-CM | POA: Diagnosis not present

## 2018-02-28 DIAGNOSIS — Z89411 Acquired absence of right great toe: Secondary | ICD-10-CM | POA: Diagnosis not present

## 2018-02-28 DIAGNOSIS — Z951 Presence of aortocoronary bypass graft: Secondary | ICD-10-CM | POA: Diagnosis not present

## 2018-02-28 DIAGNOSIS — I130 Hypertensive heart and chronic kidney disease with heart failure and stage 1 through stage 4 chronic kidney disease, or unspecified chronic kidney disease: Secondary | ICD-10-CM | POA: Diagnosis not present

## 2018-02-28 DIAGNOSIS — M880 Osteitis deformans of skull: Secondary | ICD-10-CM | POA: Diagnosis not present

## 2018-02-28 DIAGNOSIS — Z89422 Acquired absence of other left toe(s): Secondary | ICD-10-CM | POA: Diagnosis not present

## 2018-02-28 DIAGNOSIS — Z794 Long term (current) use of insulin: Secondary | ICD-10-CM | POA: Diagnosis not present

## 2018-02-28 DIAGNOSIS — E785 Hyperlipidemia, unspecified: Secondary | ICD-10-CM | POA: Diagnosis not present

## 2018-02-28 DIAGNOSIS — J449 Chronic obstructive pulmonary disease, unspecified: Secondary | ICD-10-CM | POA: Diagnosis not present

## 2018-02-28 DIAGNOSIS — I259 Chronic ischemic heart disease, unspecified: Secondary | ICD-10-CM | POA: Diagnosis not present

## 2018-02-28 DIAGNOSIS — E538 Deficiency of other specified B group vitamins: Secondary | ICD-10-CM | POA: Diagnosis not present

## 2018-02-28 DIAGNOSIS — N184 Chronic kidney disease, stage 4 (severe): Secondary | ICD-10-CM | POA: Diagnosis not present

## 2018-02-28 DIAGNOSIS — E1142 Type 2 diabetes mellitus with diabetic polyneuropathy: Secondary | ICD-10-CM | POA: Diagnosis not present

## 2018-02-28 DIAGNOSIS — I5022 Chronic systolic (congestive) heart failure: Secondary | ICD-10-CM | POA: Diagnosis not present

## 2018-02-28 DIAGNOSIS — E559 Vitamin D deficiency, unspecified: Secondary | ICD-10-CM | POA: Diagnosis not present

## 2018-02-28 DIAGNOSIS — E1122 Type 2 diabetes mellitus with diabetic chronic kidney disease: Secondary | ICD-10-CM | POA: Diagnosis not present

## 2018-02-28 DIAGNOSIS — Z7982 Long term (current) use of aspirin: Secondary | ICD-10-CM | POA: Diagnosis not present

## 2018-02-28 DIAGNOSIS — Z89431 Acquired absence of right foot: Secondary | ICD-10-CM | POA: Diagnosis not present

## 2018-02-28 DIAGNOSIS — E1151 Type 2 diabetes mellitus with diabetic peripheral angiopathy without gangrene: Secondary | ICD-10-CM | POA: Diagnosis not present

## 2018-02-28 DIAGNOSIS — N2581 Secondary hyperparathyroidism of renal origin: Secondary | ICD-10-CM | POA: Diagnosis not present

## 2018-03-01 ENCOUNTER — Other Ambulatory Visit: Payer: Self-pay | Admitting: Internal Medicine

## 2018-03-01 DIAGNOSIS — Z89411 Acquired absence of right great toe: Secondary | ICD-10-CM | POA: Diagnosis not present

## 2018-03-01 DIAGNOSIS — Z89422 Acquired absence of other left toe(s): Secondary | ICD-10-CM | POA: Diagnosis not present

## 2018-03-01 DIAGNOSIS — J449 Chronic obstructive pulmonary disease, unspecified: Secondary | ICD-10-CM | POA: Diagnosis not present

## 2018-03-01 DIAGNOSIS — Z794 Long term (current) use of insulin: Secondary | ICD-10-CM | POA: Diagnosis not present

## 2018-03-01 DIAGNOSIS — N2581 Secondary hyperparathyroidism of renal origin: Secondary | ICD-10-CM | POA: Diagnosis not present

## 2018-03-01 DIAGNOSIS — Z89431 Acquired absence of right foot: Secondary | ICD-10-CM | POA: Diagnosis not present

## 2018-03-01 DIAGNOSIS — E538 Deficiency of other specified B group vitamins: Secondary | ICD-10-CM | POA: Diagnosis not present

## 2018-03-01 DIAGNOSIS — E559 Vitamin D deficiency, unspecified: Secondary | ICD-10-CM | POA: Diagnosis not present

## 2018-03-01 DIAGNOSIS — E1151 Type 2 diabetes mellitus with diabetic peripheral angiopathy without gangrene: Secondary | ICD-10-CM | POA: Diagnosis not present

## 2018-03-01 DIAGNOSIS — N184 Chronic kidney disease, stage 4 (severe): Secondary | ICD-10-CM | POA: Diagnosis not present

## 2018-03-01 DIAGNOSIS — E785 Hyperlipidemia, unspecified: Secondary | ICD-10-CM | POA: Diagnosis not present

## 2018-03-01 DIAGNOSIS — E1122 Type 2 diabetes mellitus with diabetic chronic kidney disease: Secondary | ICD-10-CM | POA: Diagnosis not present

## 2018-03-01 DIAGNOSIS — I130 Hypertensive heart and chronic kidney disease with heart failure and stage 1 through stage 4 chronic kidney disease, or unspecified chronic kidney disease: Secondary | ICD-10-CM | POA: Diagnosis not present

## 2018-03-01 DIAGNOSIS — I259 Chronic ischemic heart disease, unspecified: Secondary | ICD-10-CM | POA: Diagnosis not present

## 2018-03-01 DIAGNOSIS — D631 Anemia in chronic kidney disease: Secondary | ICD-10-CM | POA: Diagnosis not present

## 2018-03-01 DIAGNOSIS — E1142 Type 2 diabetes mellitus with diabetic polyneuropathy: Secondary | ICD-10-CM | POA: Diagnosis not present

## 2018-03-01 DIAGNOSIS — M880 Osteitis deformans of skull: Secondary | ICD-10-CM | POA: Diagnosis not present

## 2018-03-01 DIAGNOSIS — Z951 Presence of aortocoronary bypass graft: Secondary | ICD-10-CM | POA: Diagnosis not present

## 2018-03-01 DIAGNOSIS — I495 Sick sinus syndrome: Secondary | ICD-10-CM | POA: Diagnosis not present

## 2018-03-01 DIAGNOSIS — Z950 Presence of cardiac pacemaker: Secondary | ICD-10-CM | POA: Diagnosis not present

## 2018-03-01 DIAGNOSIS — I5022 Chronic systolic (congestive) heart failure: Secondary | ICD-10-CM | POA: Diagnosis not present

## 2018-03-01 DIAGNOSIS — I25111 Atherosclerotic heart disease of native coronary artery with angina pectoris with documented spasm: Secondary | ICD-10-CM | POA: Diagnosis not present

## 2018-03-01 DIAGNOSIS — Z7982 Long term (current) use of aspirin: Secondary | ICD-10-CM | POA: Diagnosis not present

## 2018-03-04 DIAGNOSIS — Z7982 Long term (current) use of aspirin: Secondary | ICD-10-CM | POA: Diagnosis not present

## 2018-03-04 DIAGNOSIS — E1142 Type 2 diabetes mellitus with diabetic polyneuropathy: Secondary | ICD-10-CM | POA: Diagnosis not present

## 2018-03-04 DIAGNOSIS — E1122 Type 2 diabetes mellitus with diabetic chronic kidney disease: Secondary | ICD-10-CM | POA: Diagnosis not present

## 2018-03-04 DIAGNOSIS — Z89411 Acquired absence of right great toe: Secondary | ICD-10-CM | POA: Diagnosis not present

## 2018-03-04 DIAGNOSIS — E559 Vitamin D deficiency, unspecified: Secondary | ICD-10-CM | POA: Diagnosis not present

## 2018-03-04 DIAGNOSIS — J449 Chronic obstructive pulmonary disease, unspecified: Secondary | ICD-10-CM | POA: Diagnosis not present

## 2018-03-04 DIAGNOSIS — I495 Sick sinus syndrome: Secondary | ICD-10-CM | POA: Diagnosis not present

## 2018-03-04 DIAGNOSIS — E1151 Type 2 diabetes mellitus with diabetic peripheral angiopathy without gangrene: Secondary | ICD-10-CM | POA: Diagnosis not present

## 2018-03-04 DIAGNOSIS — E785 Hyperlipidemia, unspecified: Secondary | ICD-10-CM | POA: Diagnosis not present

## 2018-03-04 DIAGNOSIS — E538 Deficiency of other specified B group vitamins: Secondary | ICD-10-CM | POA: Diagnosis not present

## 2018-03-04 DIAGNOSIS — N184 Chronic kidney disease, stage 4 (severe): Secondary | ICD-10-CM | POA: Diagnosis not present

## 2018-03-04 DIAGNOSIS — D631 Anemia in chronic kidney disease: Secondary | ICD-10-CM | POA: Diagnosis not present

## 2018-03-04 DIAGNOSIS — Z951 Presence of aortocoronary bypass graft: Secondary | ICD-10-CM | POA: Diagnosis not present

## 2018-03-04 DIAGNOSIS — I25111 Atherosclerotic heart disease of native coronary artery with angina pectoris with documented spasm: Secondary | ICD-10-CM | POA: Diagnosis not present

## 2018-03-04 DIAGNOSIS — M880 Osteitis deformans of skull: Secondary | ICD-10-CM | POA: Diagnosis not present

## 2018-03-04 DIAGNOSIS — Z89422 Acquired absence of other left toe(s): Secondary | ICD-10-CM | POA: Diagnosis not present

## 2018-03-04 DIAGNOSIS — Z794 Long term (current) use of insulin: Secondary | ICD-10-CM | POA: Diagnosis not present

## 2018-03-04 DIAGNOSIS — Z950 Presence of cardiac pacemaker: Secondary | ICD-10-CM | POA: Diagnosis not present

## 2018-03-04 DIAGNOSIS — I130 Hypertensive heart and chronic kidney disease with heart failure and stage 1 through stage 4 chronic kidney disease, or unspecified chronic kidney disease: Secondary | ICD-10-CM | POA: Diagnosis not present

## 2018-03-04 DIAGNOSIS — N2581 Secondary hyperparathyroidism of renal origin: Secondary | ICD-10-CM | POA: Diagnosis not present

## 2018-03-04 DIAGNOSIS — I5022 Chronic systolic (congestive) heart failure: Secondary | ICD-10-CM | POA: Diagnosis not present

## 2018-03-04 DIAGNOSIS — I259 Chronic ischemic heart disease, unspecified: Secondary | ICD-10-CM | POA: Diagnosis not present

## 2018-03-04 DIAGNOSIS — Z89431 Acquired absence of right foot: Secondary | ICD-10-CM | POA: Diagnosis not present

## 2018-03-06 DIAGNOSIS — J449 Chronic obstructive pulmonary disease, unspecified: Secondary | ICD-10-CM | POA: Diagnosis not present

## 2018-03-06 DIAGNOSIS — Z7982 Long term (current) use of aspirin: Secondary | ICD-10-CM | POA: Diagnosis not present

## 2018-03-06 DIAGNOSIS — I5022 Chronic systolic (congestive) heart failure: Secondary | ICD-10-CM | POA: Diagnosis not present

## 2018-03-06 DIAGNOSIS — I259 Chronic ischemic heart disease, unspecified: Secondary | ICD-10-CM | POA: Diagnosis not present

## 2018-03-06 DIAGNOSIS — E559 Vitamin D deficiency, unspecified: Secondary | ICD-10-CM | POA: Diagnosis not present

## 2018-03-06 DIAGNOSIS — M880 Osteitis deformans of skull: Secondary | ICD-10-CM | POA: Diagnosis not present

## 2018-03-06 DIAGNOSIS — E1122 Type 2 diabetes mellitus with diabetic chronic kidney disease: Secondary | ICD-10-CM | POA: Diagnosis not present

## 2018-03-06 DIAGNOSIS — Z794 Long term (current) use of insulin: Secondary | ICD-10-CM | POA: Diagnosis not present

## 2018-03-06 DIAGNOSIS — Z89411 Acquired absence of right great toe: Secondary | ICD-10-CM | POA: Diagnosis not present

## 2018-03-06 DIAGNOSIS — Z951 Presence of aortocoronary bypass graft: Secondary | ICD-10-CM | POA: Diagnosis not present

## 2018-03-06 DIAGNOSIS — Z89431 Acquired absence of right foot: Secondary | ICD-10-CM | POA: Diagnosis not present

## 2018-03-06 DIAGNOSIS — E1151 Type 2 diabetes mellitus with diabetic peripheral angiopathy without gangrene: Secondary | ICD-10-CM | POA: Diagnosis not present

## 2018-03-06 DIAGNOSIS — I130 Hypertensive heart and chronic kidney disease with heart failure and stage 1 through stage 4 chronic kidney disease, or unspecified chronic kidney disease: Secondary | ICD-10-CM | POA: Diagnosis not present

## 2018-03-06 DIAGNOSIS — Z89422 Acquired absence of other left toe(s): Secondary | ICD-10-CM | POA: Diagnosis not present

## 2018-03-06 DIAGNOSIS — E1142 Type 2 diabetes mellitus with diabetic polyneuropathy: Secondary | ICD-10-CM | POA: Diagnosis not present

## 2018-03-06 DIAGNOSIS — E538 Deficiency of other specified B group vitamins: Secondary | ICD-10-CM | POA: Diagnosis not present

## 2018-03-06 DIAGNOSIS — D631 Anemia in chronic kidney disease: Secondary | ICD-10-CM | POA: Diagnosis not present

## 2018-03-06 DIAGNOSIS — E785 Hyperlipidemia, unspecified: Secondary | ICD-10-CM | POA: Diagnosis not present

## 2018-03-06 DIAGNOSIS — N184 Chronic kidney disease, stage 4 (severe): Secondary | ICD-10-CM | POA: Diagnosis not present

## 2018-03-06 DIAGNOSIS — I495 Sick sinus syndrome: Secondary | ICD-10-CM | POA: Diagnosis not present

## 2018-03-06 DIAGNOSIS — I25111 Atherosclerotic heart disease of native coronary artery with angina pectoris with documented spasm: Secondary | ICD-10-CM | POA: Diagnosis not present

## 2018-03-06 DIAGNOSIS — N2581 Secondary hyperparathyroidism of renal origin: Secondary | ICD-10-CM | POA: Diagnosis not present

## 2018-03-06 DIAGNOSIS — Z950 Presence of cardiac pacemaker: Secondary | ICD-10-CM | POA: Diagnosis not present

## 2018-03-11 DIAGNOSIS — I495 Sick sinus syndrome: Secondary | ICD-10-CM | POA: Diagnosis not present

## 2018-03-11 DIAGNOSIS — Z7982 Long term (current) use of aspirin: Secondary | ICD-10-CM | POA: Diagnosis not present

## 2018-03-11 DIAGNOSIS — Z951 Presence of aortocoronary bypass graft: Secondary | ICD-10-CM | POA: Diagnosis not present

## 2018-03-11 DIAGNOSIS — E785 Hyperlipidemia, unspecified: Secondary | ICD-10-CM | POA: Diagnosis not present

## 2018-03-11 DIAGNOSIS — E1122 Type 2 diabetes mellitus with diabetic chronic kidney disease: Secondary | ICD-10-CM | POA: Diagnosis not present

## 2018-03-11 DIAGNOSIS — I259 Chronic ischemic heart disease, unspecified: Secondary | ICD-10-CM | POA: Diagnosis not present

## 2018-03-11 DIAGNOSIS — D631 Anemia in chronic kidney disease: Secondary | ICD-10-CM | POA: Diagnosis not present

## 2018-03-11 DIAGNOSIS — Z950 Presence of cardiac pacemaker: Secondary | ICD-10-CM | POA: Diagnosis not present

## 2018-03-11 DIAGNOSIS — I130 Hypertensive heart and chronic kidney disease with heart failure and stage 1 through stage 4 chronic kidney disease, or unspecified chronic kidney disease: Secondary | ICD-10-CM | POA: Diagnosis not present

## 2018-03-11 DIAGNOSIS — J449 Chronic obstructive pulmonary disease, unspecified: Secondary | ICD-10-CM | POA: Diagnosis not present

## 2018-03-11 DIAGNOSIS — E1142 Type 2 diabetes mellitus with diabetic polyneuropathy: Secondary | ICD-10-CM | POA: Diagnosis not present

## 2018-03-11 DIAGNOSIS — M880 Osteitis deformans of skull: Secondary | ICD-10-CM | POA: Diagnosis not present

## 2018-03-11 DIAGNOSIS — N2581 Secondary hyperparathyroidism of renal origin: Secondary | ICD-10-CM | POA: Diagnosis not present

## 2018-03-11 DIAGNOSIS — Z89431 Acquired absence of right foot: Secondary | ICD-10-CM | POA: Diagnosis not present

## 2018-03-11 DIAGNOSIS — N184 Chronic kidney disease, stage 4 (severe): Secondary | ICD-10-CM | POA: Diagnosis not present

## 2018-03-11 DIAGNOSIS — E538 Deficiency of other specified B group vitamins: Secondary | ICD-10-CM | POA: Diagnosis not present

## 2018-03-11 DIAGNOSIS — I25111 Atherosclerotic heart disease of native coronary artery with angina pectoris with documented spasm: Secondary | ICD-10-CM | POA: Diagnosis not present

## 2018-03-11 DIAGNOSIS — E1151 Type 2 diabetes mellitus with diabetic peripheral angiopathy without gangrene: Secondary | ICD-10-CM | POA: Diagnosis not present

## 2018-03-11 DIAGNOSIS — E559 Vitamin D deficiency, unspecified: Secondary | ICD-10-CM | POA: Diagnosis not present

## 2018-03-11 DIAGNOSIS — Z89422 Acquired absence of other left toe(s): Secondary | ICD-10-CM | POA: Diagnosis not present

## 2018-03-11 DIAGNOSIS — Z794 Long term (current) use of insulin: Secondary | ICD-10-CM | POA: Diagnosis not present

## 2018-03-11 DIAGNOSIS — Z89411 Acquired absence of right great toe: Secondary | ICD-10-CM | POA: Diagnosis not present

## 2018-03-11 DIAGNOSIS — I5022 Chronic systolic (congestive) heart failure: Secondary | ICD-10-CM | POA: Diagnosis not present

## 2018-03-13 DIAGNOSIS — Z89431 Acquired absence of right foot: Secondary | ICD-10-CM | POA: Diagnosis not present

## 2018-03-13 DIAGNOSIS — I25111 Atherosclerotic heart disease of native coronary artery with angina pectoris with documented spasm: Secondary | ICD-10-CM | POA: Diagnosis not present

## 2018-03-13 DIAGNOSIS — I495 Sick sinus syndrome: Secondary | ICD-10-CM | POA: Diagnosis not present

## 2018-03-13 DIAGNOSIS — Z951 Presence of aortocoronary bypass graft: Secondary | ICD-10-CM | POA: Diagnosis not present

## 2018-03-13 DIAGNOSIS — J449 Chronic obstructive pulmonary disease, unspecified: Secondary | ICD-10-CM | POA: Diagnosis not present

## 2018-03-13 DIAGNOSIS — M880 Osteitis deformans of skull: Secondary | ICD-10-CM | POA: Diagnosis not present

## 2018-03-13 DIAGNOSIS — E785 Hyperlipidemia, unspecified: Secondary | ICD-10-CM | POA: Diagnosis not present

## 2018-03-13 DIAGNOSIS — E1151 Type 2 diabetes mellitus with diabetic peripheral angiopathy without gangrene: Secondary | ICD-10-CM | POA: Diagnosis not present

## 2018-03-13 DIAGNOSIS — I130 Hypertensive heart and chronic kidney disease with heart failure and stage 1 through stage 4 chronic kidney disease, or unspecified chronic kidney disease: Secondary | ICD-10-CM | POA: Diagnosis not present

## 2018-03-13 DIAGNOSIS — Z7982 Long term (current) use of aspirin: Secondary | ICD-10-CM | POA: Diagnosis not present

## 2018-03-13 DIAGNOSIS — D631 Anemia in chronic kidney disease: Secondary | ICD-10-CM | POA: Diagnosis not present

## 2018-03-13 DIAGNOSIS — Z950 Presence of cardiac pacemaker: Secondary | ICD-10-CM | POA: Diagnosis not present

## 2018-03-13 DIAGNOSIS — E1142 Type 2 diabetes mellitus with diabetic polyneuropathy: Secondary | ICD-10-CM | POA: Diagnosis not present

## 2018-03-13 DIAGNOSIS — I259 Chronic ischemic heart disease, unspecified: Secondary | ICD-10-CM | POA: Diagnosis not present

## 2018-03-13 DIAGNOSIS — N184 Chronic kidney disease, stage 4 (severe): Secondary | ICD-10-CM | POA: Diagnosis not present

## 2018-03-13 DIAGNOSIS — Z89422 Acquired absence of other left toe(s): Secondary | ICD-10-CM | POA: Diagnosis not present

## 2018-03-13 DIAGNOSIS — Z89411 Acquired absence of right great toe: Secondary | ICD-10-CM | POA: Diagnosis not present

## 2018-03-13 DIAGNOSIS — N2581 Secondary hyperparathyroidism of renal origin: Secondary | ICD-10-CM | POA: Diagnosis not present

## 2018-03-13 DIAGNOSIS — E559 Vitamin D deficiency, unspecified: Secondary | ICD-10-CM | POA: Diagnosis not present

## 2018-03-13 DIAGNOSIS — I5022 Chronic systolic (congestive) heart failure: Secondary | ICD-10-CM | POA: Diagnosis not present

## 2018-03-13 DIAGNOSIS — E1122 Type 2 diabetes mellitus with diabetic chronic kidney disease: Secondary | ICD-10-CM | POA: Diagnosis not present

## 2018-03-13 DIAGNOSIS — Z794 Long term (current) use of insulin: Secondary | ICD-10-CM | POA: Diagnosis not present

## 2018-03-13 DIAGNOSIS — E538 Deficiency of other specified B group vitamins: Secondary | ICD-10-CM | POA: Diagnosis not present

## 2018-03-18 DIAGNOSIS — E1122 Type 2 diabetes mellitus with diabetic chronic kidney disease: Secondary | ICD-10-CM | POA: Diagnosis not present

## 2018-03-18 DIAGNOSIS — I259 Chronic ischemic heart disease, unspecified: Secondary | ICD-10-CM | POA: Diagnosis not present

## 2018-03-18 DIAGNOSIS — M880 Osteitis deformans of skull: Secondary | ICD-10-CM | POA: Diagnosis not present

## 2018-03-18 DIAGNOSIS — Z89411 Acquired absence of right great toe: Secondary | ICD-10-CM | POA: Diagnosis not present

## 2018-03-18 DIAGNOSIS — I25111 Atherosclerotic heart disease of native coronary artery with angina pectoris with documented spasm: Secondary | ICD-10-CM | POA: Diagnosis not present

## 2018-03-18 DIAGNOSIS — Z89431 Acquired absence of right foot: Secondary | ICD-10-CM | POA: Diagnosis not present

## 2018-03-18 DIAGNOSIS — I5022 Chronic systolic (congestive) heart failure: Secondary | ICD-10-CM | POA: Diagnosis not present

## 2018-03-18 DIAGNOSIS — Z794 Long term (current) use of insulin: Secondary | ICD-10-CM | POA: Diagnosis not present

## 2018-03-18 DIAGNOSIS — E1151 Type 2 diabetes mellitus with diabetic peripheral angiopathy without gangrene: Secondary | ICD-10-CM | POA: Diagnosis not present

## 2018-03-18 DIAGNOSIS — E785 Hyperlipidemia, unspecified: Secondary | ICD-10-CM | POA: Diagnosis not present

## 2018-03-18 DIAGNOSIS — J449 Chronic obstructive pulmonary disease, unspecified: Secondary | ICD-10-CM | POA: Diagnosis not present

## 2018-03-18 DIAGNOSIS — Z950 Presence of cardiac pacemaker: Secondary | ICD-10-CM | POA: Diagnosis not present

## 2018-03-18 DIAGNOSIS — Z7982 Long term (current) use of aspirin: Secondary | ICD-10-CM | POA: Diagnosis not present

## 2018-03-18 DIAGNOSIS — Z951 Presence of aortocoronary bypass graft: Secondary | ICD-10-CM | POA: Diagnosis not present

## 2018-03-18 DIAGNOSIS — E559 Vitamin D deficiency, unspecified: Secondary | ICD-10-CM | POA: Diagnosis not present

## 2018-03-18 DIAGNOSIS — E1142 Type 2 diabetes mellitus with diabetic polyneuropathy: Secondary | ICD-10-CM | POA: Diagnosis not present

## 2018-03-18 DIAGNOSIS — N2581 Secondary hyperparathyroidism of renal origin: Secondary | ICD-10-CM | POA: Diagnosis not present

## 2018-03-18 DIAGNOSIS — I130 Hypertensive heart and chronic kidney disease with heart failure and stage 1 through stage 4 chronic kidney disease, or unspecified chronic kidney disease: Secondary | ICD-10-CM | POA: Diagnosis not present

## 2018-03-18 DIAGNOSIS — I495 Sick sinus syndrome: Secondary | ICD-10-CM | POA: Diagnosis not present

## 2018-03-18 DIAGNOSIS — N184 Chronic kidney disease, stage 4 (severe): Secondary | ICD-10-CM | POA: Diagnosis not present

## 2018-03-18 DIAGNOSIS — D631 Anemia in chronic kidney disease: Secondary | ICD-10-CM | POA: Diagnosis not present

## 2018-03-18 DIAGNOSIS — Z89422 Acquired absence of other left toe(s): Secondary | ICD-10-CM | POA: Diagnosis not present

## 2018-03-18 DIAGNOSIS — E538 Deficiency of other specified B group vitamins: Secondary | ICD-10-CM | POA: Diagnosis not present

## 2018-03-20 DIAGNOSIS — Z89411 Acquired absence of right great toe: Secondary | ICD-10-CM | POA: Diagnosis not present

## 2018-03-20 DIAGNOSIS — Z89431 Acquired absence of right foot: Secondary | ICD-10-CM | POA: Diagnosis not present

## 2018-03-20 DIAGNOSIS — I5022 Chronic systolic (congestive) heart failure: Secondary | ICD-10-CM | POA: Diagnosis not present

## 2018-03-20 DIAGNOSIS — J449 Chronic obstructive pulmonary disease, unspecified: Secondary | ICD-10-CM | POA: Diagnosis not present

## 2018-03-20 DIAGNOSIS — I130 Hypertensive heart and chronic kidney disease with heart failure and stage 1 through stage 4 chronic kidney disease, or unspecified chronic kidney disease: Secondary | ICD-10-CM | POA: Diagnosis not present

## 2018-03-20 DIAGNOSIS — E1142 Type 2 diabetes mellitus with diabetic polyneuropathy: Secondary | ICD-10-CM | POA: Diagnosis not present

## 2018-03-20 DIAGNOSIS — E559 Vitamin D deficiency, unspecified: Secondary | ICD-10-CM | POA: Diagnosis not present

## 2018-03-20 DIAGNOSIS — N184 Chronic kidney disease, stage 4 (severe): Secondary | ICD-10-CM | POA: Diagnosis not present

## 2018-03-20 DIAGNOSIS — E1122 Type 2 diabetes mellitus with diabetic chronic kidney disease: Secondary | ICD-10-CM | POA: Diagnosis not present

## 2018-03-20 DIAGNOSIS — Z89422 Acquired absence of other left toe(s): Secondary | ICD-10-CM | POA: Diagnosis not present

## 2018-03-20 DIAGNOSIS — N2581 Secondary hyperparathyroidism of renal origin: Secondary | ICD-10-CM | POA: Diagnosis not present

## 2018-03-20 DIAGNOSIS — E785 Hyperlipidemia, unspecified: Secondary | ICD-10-CM | POA: Diagnosis not present

## 2018-03-20 DIAGNOSIS — I259 Chronic ischemic heart disease, unspecified: Secondary | ICD-10-CM | POA: Diagnosis not present

## 2018-03-20 DIAGNOSIS — I495 Sick sinus syndrome: Secondary | ICD-10-CM | POA: Diagnosis not present

## 2018-03-20 DIAGNOSIS — Z7982 Long term (current) use of aspirin: Secondary | ICD-10-CM | POA: Diagnosis not present

## 2018-03-20 DIAGNOSIS — E538 Deficiency of other specified B group vitamins: Secondary | ICD-10-CM | POA: Diagnosis not present

## 2018-03-20 DIAGNOSIS — I25111 Atherosclerotic heart disease of native coronary artery with angina pectoris with documented spasm: Secondary | ICD-10-CM | POA: Diagnosis not present

## 2018-03-20 DIAGNOSIS — D631 Anemia in chronic kidney disease: Secondary | ICD-10-CM | POA: Diagnosis not present

## 2018-03-20 DIAGNOSIS — Z951 Presence of aortocoronary bypass graft: Secondary | ICD-10-CM | POA: Diagnosis not present

## 2018-03-20 DIAGNOSIS — E1151 Type 2 diabetes mellitus with diabetic peripheral angiopathy without gangrene: Secondary | ICD-10-CM | POA: Diagnosis not present

## 2018-03-20 DIAGNOSIS — M880 Osteitis deformans of skull: Secondary | ICD-10-CM | POA: Diagnosis not present

## 2018-03-20 DIAGNOSIS — Z950 Presence of cardiac pacemaker: Secondary | ICD-10-CM | POA: Diagnosis not present

## 2018-03-20 DIAGNOSIS — Z794 Long term (current) use of insulin: Secondary | ICD-10-CM | POA: Diagnosis not present

## 2018-03-25 DIAGNOSIS — Z89431 Acquired absence of right foot: Secondary | ICD-10-CM | POA: Diagnosis not present

## 2018-03-25 DIAGNOSIS — N184 Chronic kidney disease, stage 4 (severe): Secondary | ICD-10-CM | POA: Diagnosis not present

## 2018-03-25 DIAGNOSIS — N2581 Secondary hyperparathyroidism of renal origin: Secondary | ICD-10-CM | POA: Diagnosis not present

## 2018-03-25 DIAGNOSIS — Z794 Long term (current) use of insulin: Secondary | ICD-10-CM | POA: Diagnosis not present

## 2018-03-25 DIAGNOSIS — I25111 Atherosclerotic heart disease of native coronary artery with angina pectoris with documented spasm: Secondary | ICD-10-CM | POA: Diagnosis not present

## 2018-03-25 DIAGNOSIS — I5022 Chronic systolic (congestive) heart failure: Secondary | ICD-10-CM | POA: Diagnosis not present

## 2018-03-25 DIAGNOSIS — Z950 Presence of cardiac pacemaker: Secondary | ICD-10-CM | POA: Diagnosis not present

## 2018-03-25 DIAGNOSIS — I130 Hypertensive heart and chronic kidney disease with heart failure and stage 1 through stage 4 chronic kidney disease, or unspecified chronic kidney disease: Secondary | ICD-10-CM | POA: Diagnosis not present

## 2018-03-25 DIAGNOSIS — E559 Vitamin D deficiency, unspecified: Secondary | ICD-10-CM | POA: Diagnosis not present

## 2018-03-25 DIAGNOSIS — J449 Chronic obstructive pulmonary disease, unspecified: Secondary | ICD-10-CM | POA: Diagnosis not present

## 2018-03-25 DIAGNOSIS — Z89422 Acquired absence of other left toe(s): Secondary | ICD-10-CM | POA: Diagnosis not present

## 2018-03-25 DIAGNOSIS — E1122 Type 2 diabetes mellitus with diabetic chronic kidney disease: Secondary | ICD-10-CM | POA: Diagnosis not present

## 2018-03-25 DIAGNOSIS — I259 Chronic ischemic heart disease, unspecified: Secondary | ICD-10-CM | POA: Diagnosis not present

## 2018-03-25 DIAGNOSIS — E1151 Type 2 diabetes mellitus with diabetic peripheral angiopathy without gangrene: Secondary | ICD-10-CM | POA: Diagnosis not present

## 2018-03-25 DIAGNOSIS — Z7982 Long term (current) use of aspirin: Secondary | ICD-10-CM | POA: Diagnosis not present

## 2018-03-25 DIAGNOSIS — E538 Deficiency of other specified B group vitamins: Secondary | ICD-10-CM | POA: Diagnosis not present

## 2018-03-25 DIAGNOSIS — Z951 Presence of aortocoronary bypass graft: Secondary | ICD-10-CM | POA: Diagnosis not present

## 2018-03-25 DIAGNOSIS — E1142 Type 2 diabetes mellitus with diabetic polyneuropathy: Secondary | ICD-10-CM | POA: Diagnosis not present

## 2018-03-25 DIAGNOSIS — M880 Osteitis deformans of skull: Secondary | ICD-10-CM | POA: Diagnosis not present

## 2018-03-25 DIAGNOSIS — D631 Anemia in chronic kidney disease: Secondary | ICD-10-CM | POA: Diagnosis not present

## 2018-03-25 DIAGNOSIS — E785 Hyperlipidemia, unspecified: Secondary | ICD-10-CM | POA: Diagnosis not present

## 2018-03-25 DIAGNOSIS — Z89411 Acquired absence of right great toe: Secondary | ICD-10-CM | POA: Diagnosis not present

## 2018-03-25 DIAGNOSIS — I495 Sick sinus syndrome: Secondary | ICD-10-CM | POA: Diagnosis not present

## 2018-03-28 DIAGNOSIS — I495 Sick sinus syndrome: Secondary | ICD-10-CM | POA: Diagnosis not present

## 2018-03-28 DIAGNOSIS — I130 Hypertensive heart and chronic kidney disease with heart failure and stage 1 through stage 4 chronic kidney disease, or unspecified chronic kidney disease: Secondary | ICD-10-CM | POA: Diagnosis not present

## 2018-03-28 DIAGNOSIS — E538 Deficiency of other specified B group vitamins: Secondary | ICD-10-CM | POA: Diagnosis not present

## 2018-03-28 DIAGNOSIS — I259 Chronic ischemic heart disease, unspecified: Secondary | ICD-10-CM | POA: Diagnosis not present

## 2018-03-28 DIAGNOSIS — Z951 Presence of aortocoronary bypass graft: Secondary | ICD-10-CM | POA: Diagnosis not present

## 2018-03-28 DIAGNOSIS — J449 Chronic obstructive pulmonary disease, unspecified: Secondary | ICD-10-CM | POA: Diagnosis not present

## 2018-03-28 DIAGNOSIS — Z89422 Acquired absence of other left toe(s): Secondary | ICD-10-CM | POA: Diagnosis not present

## 2018-03-28 DIAGNOSIS — I25111 Atherosclerotic heart disease of native coronary artery with angina pectoris with documented spasm: Secondary | ICD-10-CM | POA: Diagnosis not present

## 2018-03-28 DIAGNOSIS — E1151 Type 2 diabetes mellitus with diabetic peripheral angiopathy without gangrene: Secondary | ICD-10-CM | POA: Diagnosis not present

## 2018-03-28 DIAGNOSIS — Z89431 Acquired absence of right foot: Secondary | ICD-10-CM | POA: Diagnosis not present

## 2018-03-28 DIAGNOSIS — D631 Anemia in chronic kidney disease: Secondary | ICD-10-CM | POA: Diagnosis not present

## 2018-03-28 DIAGNOSIS — N184 Chronic kidney disease, stage 4 (severe): Secondary | ICD-10-CM | POA: Diagnosis not present

## 2018-03-28 DIAGNOSIS — Z7982 Long term (current) use of aspirin: Secondary | ICD-10-CM | POA: Diagnosis not present

## 2018-03-28 DIAGNOSIS — N2581 Secondary hyperparathyroidism of renal origin: Secondary | ICD-10-CM | POA: Diagnosis not present

## 2018-03-28 DIAGNOSIS — E1122 Type 2 diabetes mellitus with diabetic chronic kidney disease: Secondary | ICD-10-CM | POA: Diagnosis not present

## 2018-03-28 DIAGNOSIS — Z89411 Acquired absence of right great toe: Secondary | ICD-10-CM | POA: Diagnosis not present

## 2018-03-28 DIAGNOSIS — E785 Hyperlipidemia, unspecified: Secondary | ICD-10-CM | POA: Diagnosis not present

## 2018-03-28 DIAGNOSIS — Z950 Presence of cardiac pacemaker: Secondary | ICD-10-CM | POA: Diagnosis not present

## 2018-03-28 DIAGNOSIS — I5022 Chronic systolic (congestive) heart failure: Secondary | ICD-10-CM | POA: Diagnosis not present

## 2018-03-28 DIAGNOSIS — M880 Osteitis deformans of skull: Secondary | ICD-10-CM | POA: Diagnosis not present

## 2018-03-28 DIAGNOSIS — E1142 Type 2 diabetes mellitus with diabetic polyneuropathy: Secondary | ICD-10-CM | POA: Diagnosis not present

## 2018-03-28 DIAGNOSIS — Z794 Long term (current) use of insulin: Secondary | ICD-10-CM | POA: Diagnosis not present

## 2018-03-28 DIAGNOSIS — E559 Vitamin D deficiency, unspecified: Secondary | ICD-10-CM | POA: Diagnosis not present

## 2018-04-14 ENCOUNTER — Other Ambulatory Visit: Payer: Self-pay

## 2018-04-14 ENCOUNTER — Emergency Department (HOSPITAL_COMMUNITY): Payer: Medicare Other

## 2018-04-14 ENCOUNTER — Observation Stay (HOSPITAL_COMMUNITY): Payer: Medicare Other

## 2018-04-14 ENCOUNTER — Inpatient Hospital Stay (HOSPITAL_COMMUNITY)
Admission: EM | Admit: 2018-04-14 | Discharge: 2018-04-18 | DRG: 682 | Disposition: A | Discharge status: Skilled Nursing Facility | Payer: Medicare Other | Attending: Internal Medicine | Admitting: Internal Medicine

## 2018-04-14 ENCOUNTER — Encounter (HOSPITAL_COMMUNITY): Payer: Self-pay

## 2018-04-14 DIAGNOSIS — G9341 Metabolic encephalopathy: Secondary | ICD-10-CM | POA: Diagnosis not present

## 2018-04-14 DIAGNOSIS — R4182 Altered mental status, unspecified: Secondary | ICD-10-CM

## 2018-04-14 DIAGNOSIS — Z515 Encounter for palliative care: Secondary | ICD-10-CM | POA: Diagnosis present

## 2018-04-14 DIAGNOSIS — N179 Acute kidney failure, unspecified: Principal | ICD-10-CM

## 2018-04-14 DIAGNOSIS — I213 ST elevation (STEMI) myocardial infarction of unspecified site: Secondary | ICD-10-CM | POA: Diagnosis not present

## 2018-04-14 DIAGNOSIS — W19XXXA Unspecified fall, initial encounter: Secondary | ICD-10-CM

## 2018-04-14 DIAGNOSIS — N3000 Acute cystitis without hematuria: Secondary | ICD-10-CM

## 2018-04-14 DIAGNOSIS — J449 Chronic obstructive pulmonary disease, unspecified: Secondary | ICD-10-CM | POA: Diagnosis present

## 2018-04-14 DIAGNOSIS — R7989 Other specified abnormal findings of blood chemistry: Secondary | ICD-10-CM | POA: Diagnosis present

## 2018-04-14 DIAGNOSIS — Z79899 Other long term (current) drug therapy: Secondary | ICD-10-CM

## 2018-04-14 DIAGNOSIS — I499 Cardiac arrhythmia, unspecified: Secondary | ICD-10-CM | POA: Diagnosis not present

## 2018-04-14 DIAGNOSIS — IMO0002 Reserved for concepts with insufficient information to code with codable children: Secondary | ICD-10-CM | POA: Diagnosis present

## 2018-04-14 DIAGNOSIS — D509 Iron deficiency anemia, unspecified: Secondary | ICD-10-CM

## 2018-04-14 DIAGNOSIS — D631 Anemia in chronic kidney disease: Secondary | ICD-10-CM | POA: Diagnosis present

## 2018-04-14 DIAGNOSIS — E785 Hyperlipidemia, unspecified: Secondary | ICD-10-CM | POA: Diagnosis not present

## 2018-04-14 DIAGNOSIS — I2510 Atherosclerotic heart disease of native coronary artery without angina pectoris: Secondary | ICD-10-CM | POA: Diagnosis present

## 2018-04-14 DIAGNOSIS — E1165 Type 2 diabetes mellitus with hyperglycemia: Secondary | ICD-10-CM | POA: Diagnosis not present

## 2018-04-14 DIAGNOSIS — N390 Urinary tract infection, site not specified: Secondary | ICD-10-CM | POA: Diagnosis present

## 2018-04-14 DIAGNOSIS — C775 Secondary and unspecified malignant neoplasm of intrapelvic lymph nodes: Secondary | ICD-10-CM | POA: Diagnosis present

## 2018-04-14 DIAGNOSIS — Z9861 Coronary angioplasty status: Secondary | ICD-10-CM

## 2018-04-14 DIAGNOSIS — I130 Hypertensive heart and chronic kidney disease with heart failure and stage 1 through stage 4 chronic kidney disease, or unspecified chronic kidney disease: Secondary | ICD-10-CM | POA: Diagnosis present

## 2018-04-14 DIAGNOSIS — F0390 Unspecified dementia without behavioral disturbance: Secondary | ICD-10-CM | POA: Diagnosis present

## 2018-04-14 DIAGNOSIS — R400 Somnolence: Secondary | ICD-10-CM | POA: Diagnosis not present

## 2018-04-14 DIAGNOSIS — R531 Weakness: Secondary | ICD-10-CM

## 2018-04-14 DIAGNOSIS — R32 Unspecified urinary incontinence: Secondary | ICD-10-CM | POA: Diagnosis present

## 2018-04-14 DIAGNOSIS — Z833 Family history of diabetes mellitus: Secondary | ICD-10-CM | POA: Diagnosis not present

## 2018-04-14 DIAGNOSIS — K219 Gastro-esophageal reflux disease without esophagitis: Secondary | ICD-10-CM | POA: Diagnosis present

## 2018-04-14 DIAGNOSIS — E872 Acidosis, unspecified: Secondary | ICD-10-CM | POA: Diagnosis present

## 2018-04-14 DIAGNOSIS — Z7982 Long term (current) use of aspirin: Secondary | ICD-10-CM | POA: Diagnosis not present

## 2018-04-14 DIAGNOSIS — I5022 Chronic systolic (congestive) heart failure: Secondary | ICD-10-CM | POA: Diagnosis not present

## 2018-04-14 DIAGNOSIS — R778 Other specified abnormalities of plasma proteins: Secondary | ICD-10-CM

## 2018-04-14 DIAGNOSIS — Z8249 Family history of ischemic heart disease and other diseases of the circulatory system: Secondary | ICD-10-CM

## 2018-04-14 DIAGNOSIS — N183 Chronic kidney disease, stage 3 (moderate): Secondary | ICD-10-CM | POA: Diagnosis not present

## 2018-04-14 DIAGNOSIS — Z87891 Personal history of nicotine dependence: Secondary | ICD-10-CM | POA: Diagnosis not present

## 2018-04-14 DIAGNOSIS — N170 Acute kidney failure with tubular necrosis: Secondary | ICD-10-CM | POA: Diagnosis not present

## 2018-04-14 DIAGNOSIS — Z951 Presence of aortocoronary bypass graft: Secondary | ICD-10-CM

## 2018-04-14 DIAGNOSIS — I252 Old myocardial infarction: Secondary | ICD-10-CM | POA: Diagnosis not present

## 2018-04-14 DIAGNOSIS — Z79818 Long term (current) use of other agents affecting estrogen receptors and estrogen levels: Secondary | ICD-10-CM

## 2018-04-14 DIAGNOSIS — E1122 Type 2 diabetes mellitus with diabetic chronic kidney disease: Secondary | ICD-10-CM | POA: Diagnosis not present

## 2018-04-14 DIAGNOSIS — E1151 Type 2 diabetes mellitus with diabetic peripheral angiopathy without gangrene: Secondary | ICD-10-CM | POA: Diagnosis not present

## 2018-04-14 DIAGNOSIS — N184 Chronic kidney disease, stage 4 (severe): Secondary | ICD-10-CM | POA: Diagnosis not present

## 2018-04-14 DIAGNOSIS — Z8673 Personal history of transient ischemic attack (TIA), and cerebral infarction without residual deficits: Secondary | ICD-10-CM | POA: Diagnosis not present

## 2018-04-14 DIAGNOSIS — C61 Malignant neoplasm of prostate: Secondary | ICD-10-CM | POA: Diagnosis not present

## 2018-04-14 DIAGNOSIS — R0902 Hypoxemia: Secondary | ICD-10-CM | POA: Diagnosis not present

## 2018-04-14 DIAGNOSIS — R296 Repeated falls: Secondary | ICD-10-CM | POA: Diagnosis present

## 2018-04-14 DIAGNOSIS — I429 Cardiomyopathy, unspecified: Secondary | ICD-10-CM | POA: Diagnosis not present

## 2018-04-14 DIAGNOSIS — J9811 Atelectasis: Secondary | ICD-10-CM | POA: Diagnosis not present

## 2018-04-14 DIAGNOSIS — I10 Essential (primary) hypertension: Secondary | ICD-10-CM | POA: Diagnosis present

## 2018-04-14 DIAGNOSIS — Z794 Long term (current) use of insulin: Secondary | ICD-10-CM

## 2018-04-14 DIAGNOSIS — E1129 Type 2 diabetes mellitus with other diabetic kidney complication: Secondary | ICD-10-CM | POA: Diagnosis not present

## 2018-04-14 LAB — URINALYSIS, ROUTINE W REFLEX MICROSCOPIC
Bilirubin Urine: NEGATIVE
GLUCOSE, UA: NEGATIVE mg/dL
Ketones, ur: NEGATIVE mg/dL
Nitrite: NEGATIVE
PROTEIN: 100 mg/dL — AB
Specific Gravity, Urine: 1.014 (ref 1.005–1.030)
pH: 5 (ref 5.0–8.0)

## 2018-04-14 LAB — CBC WITH DIFFERENTIAL/PLATELET
Abs Immature Granulocytes: 0.01 10*3/uL (ref 0.00–0.07)
Basophils Absolute: 0 10*3/uL (ref 0.0–0.1)
Basophils Relative: 0 %
Eosinophils Absolute: 0.1 10*3/uL (ref 0.0–0.5)
Eosinophils Relative: 2 %
HCT: 31.3 % — ABNORMAL LOW (ref 39.0–52.0)
Hemoglobin: 9.2 g/dL — ABNORMAL LOW (ref 13.0–17.0)
Immature Granulocytes: 0 %
Lymphocytes Relative: 23 %
Lymphs Abs: 1.2 10*3/uL (ref 0.7–4.0)
MCH: 24.3 pg — AB (ref 26.0–34.0)
MCHC: 29.4 g/dL — ABNORMAL LOW (ref 30.0–36.0)
MCV: 82.8 fL (ref 80.0–100.0)
MONO ABS: 0.6 10*3/uL (ref 0.1–1.0)
Monocytes Relative: 12 %
Neutro Abs: 3.4 10*3/uL (ref 1.7–7.7)
Neutrophils Relative %: 63 %
Platelets: 215 10*3/uL (ref 150–400)
RBC: 3.78 MIL/uL — AB (ref 4.22–5.81)
RDW: 16.4 % — ABNORMAL HIGH (ref 11.5–15.5)
WBC: 5.4 10*3/uL (ref 4.0–10.5)
nRBC: 0 % (ref 0.0–0.2)

## 2018-04-14 LAB — LIPID PANEL
Cholesterol: 132 mg/dL (ref 0–200)
HDL: 30 mg/dL — ABNORMAL LOW (ref >40)
LDL CALC: 84 mg/dL (ref 0–99)
Total CHOL/HDL Ratio: 4.4 RATIO
Triglycerides: 91 mg/dL (ref <150)
VLDL: 18 mg/dL (ref 0–40)

## 2018-04-14 LAB — COMPREHENSIVE METABOLIC PANEL
ALT: 24 U/L (ref 0–44)
AST: 30 U/L (ref 15–41)
Albumin: 3.1 g/dL — ABNORMAL LOW (ref 3.5–5.0)
Alkaline Phosphatase: 71 U/L (ref 38–126)
Anion gap: 8 (ref 5–15)
BUN: 105 mg/dL — ABNORMAL HIGH (ref 8–23)
CO2: 20 mmol/L — ABNORMAL LOW (ref 22–32)
Calcium: 9.4 mg/dL (ref 8.9–10.3)
Chloride: 109 mmol/L (ref 98–111)
Creatinine, Ser: 4.18 mg/dL — ABNORMAL HIGH (ref 0.61–1.24)
GFR calc Af Amer: 14 mL/min — ABNORMAL LOW (ref >60)
GFR, EST NON AFRICAN AMERICAN: 12 mL/min — AB (ref >60)
Glucose, Bld: 142 mg/dL — ABNORMAL HIGH (ref 70–99)
Potassium: 5 mmol/L (ref 3.5–5.1)
Sodium: 137 mmol/L (ref 135–145)
Total Bilirubin: 0.5 mg/dL (ref 0.3–1.2)
Total Protein: 7.2 g/dL (ref 6.5–8.1)

## 2018-04-14 LAB — GLUCOSE, CAPILLARY
Glucose-Capillary: 112 mg/dL — ABNORMAL HIGH (ref 70–99)
Glucose-Capillary: 112 mg/dL — ABNORMAL HIGH (ref 70–99)
Glucose-Capillary: 135 mg/dL — ABNORMAL HIGH (ref 70–99)
Glucose-Capillary: 151 mg/dL — ABNORMAL HIGH (ref 70–99)

## 2018-04-14 LAB — HEMOGLOBIN A1C
HEMOGLOBIN A1C: 6.4 % — AB (ref 4.8–5.6)
Mean Plasma Glucose: 136.98 mg/dL

## 2018-04-14 LAB — TSH: TSH: 3.286 u[IU]/mL (ref 0.350–4.500)

## 2018-04-14 LAB — CBG MONITORING, ED: GLUCOSE-CAPILLARY: 132 mg/dL — AB (ref 70–99)

## 2018-04-14 LAB — TROPONIN I
Troponin I: 0.12 ng/mL (ref <0.03)
Troponin I: 0.1 ng/mL (ref <0.03)
Troponin I: 0.1 ng/mL (ref <0.03)
Troponin I: 0.09 ng/mL (ref <0.03)

## 2018-04-14 LAB — SODIUM, URINE, RANDOM: Sodium, Ur: 26 mmol/L

## 2018-04-14 LAB — CREATININE, URINE, RANDOM: Creatinine, Urine: 148.89 mg/dL

## 2018-04-14 LAB — ETHANOL

## 2018-04-14 MED ORDER — SODIUM CHLORIDE 0.9 % IV SOLN
1.0000 g | INTRAVENOUS | Status: DC
  Administered 2018-04-14 – 2018-04-18 (×5): 1 g via INTRAVENOUS
  Filled 2018-04-14 (×5): qty 10

## 2018-04-14 MED ORDER — ASPIRIN EC 81 MG PO TBEC
81.0000 mg | DELAYED_RELEASE_TABLET | Freq: Every day | ORAL | Status: DC
  Administered 2018-04-14 – 2018-04-18 (×5): 81 mg via ORAL
  Filled 2018-04-14 (×5): qty 1

## 2018-04-14 MED ORDER — HEPARIN SODIUM (PORCINE) 5000 UNIT/ML IJ SOLN
5000.0000 [IU] | Freq: Three times a day (TID) | INTRAMUSCULAR | Status: DC
  Administered 2018-04-14 – 2018-04-18 (×13): 5000 [IU] via SUBCUTANEOUS
  Filled 2018-04-14 (×11): qty 1

## 2018-04-14 MED ORDER — SODIUM CHLORIDE 0.9 % IV SOLN
INTRAVENOUS | Status: DC | PRN
  Administered 2018-04-14: 250 mL via INTRAVENOUS
  Administered 2018-04-16 – 2018-04-17 (×2): 1000 mL via INTRAVENOUS

## 2018-04-14 MED ORDER — HYDRALAZINE HCL 20 MG/ML IJ SOLN: 5.0000 mg | INTRAMUSCULAR | Status: DC | PRN

## 2018-04-14 MED ORDER — ISOSORBIDE MONONITRATE ER 30 MG PO TB24
30.0000 mg | ORAL_TABLET | Freq: Every day | ORAL | Status: DC
  Administered 2018-04-14 – 2018-04-18 (×5): 30 mg via ORAL
  Filled 2018-04-14 (×5): qty 1

## 2018-04-14 MED ORDER — ACETAMINOPHEN 325 MG PO TABS
650.0000 mg | ORAL_TABLET | ORAL | Status: DC | PRN
  Administered 2018-04-16 – 2018-04-17 (×3): 650 mg via ORAL
  Filled 2018-04-14 (×3): qty 2

## 2018-04-14 MED ORDER — FUROSEMIDE 40 MG PO TABS
40.0000 mg | ORAL_TABLET | Freq: Every day | ORAL | Status: DC
  Filled 2018-04-14: qty 1

## 2018-04-14 MED ORDER — EZETIMIBE 10 MG PO TABS
10.0000 mg | ORAL_TABLET | Freq: Every day | ORAL | Status: DC
  Administered 2018-04-14 – 2018-04-18 (×5): 10 mg via ORAL
  Filled 2018-04-14 (×5): qty 1

## 2018-04-14 MED ORDER — INSULIN GLARGINE 100 UNIT/ML ~~LOC~~ SOLN
6.0000 [IU] | Freq: Every day | SUBCUTANEOUS | Status: DC
  Administered 2018-04-14 – 2018-04-17 (×4): 6 [IU] via SUBCUTANEOUS
  Filled 2018-04-14 (×4): qty 0.06

## 2018-04-14 MED ORDER — FERROUS SULFATE 325 (65 FE) MG PO TABS
325.0000 mg | ORAL_TABLET | Freq: Every day | ORAL | Status: DC
  Administered 2018-04-14 – 2018-04-18 (×5): 325 mg via ORAL
  Filled 2018-04-14 (×5): qty 1

## 2018-04-14 MED ORDER — VITAMIN B-12 1000 MCG PO TABS
1000.0000 ug | ORAL_TABLET | Freq: Every day | ORAL | Status: DC
  Administered 2018-04-14 – 2018-04-18 (×5): 1000 ug via ORAL
  Filled 2018-04-14 (×5): qty 1

## 2018-04-14 MED ORDER — OMEGA-3-ACID ETHYL ESTERS 1 G PO CAPS
1.0000 g | ORAL_CAPSULE | Freq: Every day | ORAL | Status: DC
  Administered 2018-04-14 – 2018-04-18 (×4): 1 g via ORAL
  Filled 2018-04-14 (×5): qty 1

## 2018-04-14 MED ORDER — INSULIN ASPART 100 UNIT/ML ~~LOC~~ SOLN
0.0000 [IU] | Freq: Three times a day (TID) | SUBCUTANEOUS | Status: DC
  Administered 2018-04-15 – 2018-04-16 (×2): 2 [IU] via SUBCUTANEOUS
  Administered 2018-04-16: 1 [IU] via SUBCUTANEOUS
  Administered 2018-04-18: 2 [IU] via SUBCUTANEOUS

## 2018-04-14 MED ORDER — SODIUM CHLORIDE 0.9 % IV BOLUS
500.0000 mL | Freq: Once | INTRAVENOUS | Status: AC
  Administered 2018-04-14: 500 mL via INTRAVENOUS

## 2018-04-14 MED ORDER — ALBUTEROL SULFATE (2.5 MG/3ML) 0.083% IN NEBU: 2.5000 mg | INHALATION_SOLUTION | RESPIRATORY_TRACT | Status: DC | PRN

## 2018-04-14 MED ORDER — HYDRALAZINE HCL 10 MG PO TABS
10.0000 mg | ORAL_TABLET | Freq: Three times a day (TID) | ORAL | Status: DC
  Administered 2018-04-14 – 2018-04-18 (×11): 10 mg via ORAL
  Filled 2018-04-14 (×12): qty 1

## 2018-04-14 MED ORDER — ONDANSETRON HCL 4 MG/2ML IJ SOLN: 4.0000 mg | Freq: Four times a day (QID) | INTRAMUSCULAR | Status: DC | PRN

## 2018-04-14 MED ORDER — AMLODIPINE BESYLATE 2.5 MG PO TABS
2.5000 mg | ORAL_TABLET | Freq: Every day | ORAL | Status: DC
  Administered 2018-04-14 – 2018-04-18 (×5): 2.5 mg via ORAL
  Filled 2018-04-14 (×5): qty 1

## 2018-04-14 MED ORDER — NITROGLYCERIN 0.4 MG SL SUBL: 0.4000 mg | SUBLINGUAL_TABLET | SUBLINGUAL | Status: DC | PRN

## 2018-04-14 NOTE — Progress Notes (Signed)
Chaplain responded to a code stemi. Which was cancelled.

## 2018-04-14 NOTE — ED Notes (Signed)
Daughter, Ernst Bowler, would like an update when possible at [phone]

## 2018-04-14 NOTE — ED Triage Notes (Signed)
Brought from home, questionable care given at home by patient's family.  EMS stated patient soaked in urine and when family asked for clean clothes, they said all of his clothing is soiled.  Pt smells of pungent urine. Pt here for evaluation of altered LOC.

## 2018-04-14 NOTE — ED Notes (Signed)
In and Out Catheterized for Urine per request of Dr Stark Jock

## 2018-04-14 NOTE — Progress Notes (Signed)
Per lab an add-on urine culture cannot be performed according to the order placed almost 12 hours ago.   Pt has now initiated antibiotics for suspected UTI and has a condom catheter in place, preventing a clean catch from being prevented at this time. Will obtain when condom cath falls off, unless directed otherwise by MD.

## 2018-04-14 NOTE — ED Provider Notes (Signed)
[name] Provider Note   CSN: [id number] Arrival date & time: 04/14/18  0030    History   Chief Complaint Chief Complaint  Patient presents with   Altered Mental Status    HPI Colin [name] is a 85 y.o. male.     Patient is an 85 year old male with extensive past medical history including congestive heart failure, chronic renal insufficiency, COPD, CABG, CVA, hypertension, and dementia.  He was brought by EMS from home for evaluation of altered mental status.  According to EMS, the family states the patient has been more confused and less responsive.  The patient adds little additional history secondary to dementia, but denies to me he is experiencing any discomfort or other specific symptoms.  Patient arrived here very unkempt with poor hygiene.  Patient was in a depends that does not appear to have been changed in a very long time.  His clothing is soaked with urine and fecal matter.  The history is provided by the patient.  Altered Mental Status  Presenting symptoms: confusion and disorientation   Severity:  Moderate Episode history:  Continuous Timing:  Constant Progression:  Worsening Chronicity:  New   Past Medical History:  Diagnosis Date   Anemia 10/29/2013   Arthritis    Bradycardia    Cardiomyopathy    Carotid artery disease ([place])    a. Carotid US 2/17: R 50-69, L plaque.   Chronic systolic CHF (congestive heart failure) (HCC)    CKD (chronic kidney disease), stage IV (HCC)    COPD (chronic obstructive pulmonary disease) (HCC)    Coronary artery disease    Status post CABG in 2004   Dementia ([place])    Diabetes mellitus with peripheral artery disease (HCC)    Dyslipidemia    GERD (gastroesophageal reflux disease)    History of anemia    History of CVA (cerebrovascular accident)    Hypertension    Myocardial infarction [facility])    Peripheral vascular disease ([place])    Status post right  femoropopliteal bypass   Prostate cancer ([place])    metastatic   Prostate cancer ([place]) 12/11/2016   Prostate cancer ([place]) 12/11/2016   SSS (sick sinus syndrome) ([place])    a. s/p PPM 2007 with lead vegetation in 2015 s/p extraction of pacemaker leads.    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 11/06/2013   Anemia, iron deficiency 11/06/2013   Anemia of chronic disease 11/06/2013   Acute blood loss anemia 10/29/2013   Weakness generalized 10/20/2013   Bacteremia 10/20/2013   Type 2 diabetes, uncontrolled, with renal manifestation ([place]) 06/03/2013   PAD (peripheral artery disease) ([place]) 04/30/2013   Other stiff joint, of the lower leg 12/31/2012   Fall 07/01/2012   Hypoglycemia 07/01/2012   Hypothermia 07/01/2012   Failure to thrive in adult 02/27/2012   Non-compliant patient 02/27/2012   Microscopic hematuria 01/11/2012   Syncope 01/09/2012   CKD (chronic kidney disease), stage III ([place]) 01/09/2012   Diabetes mellitus with peripheral artery disease (HCC)    Pain in limb 84/66/5993   Chronic systolic CHF (congestive heart failure) ([place])    Acute kidney injury ([place])    History of CVA (cerebrovascular accident)    Ischemic cardiomyopathy    COPD (chronic obstructive pulmonary disease) ([place])  History of anemia    Coronary artery disease involving native heart without angina pectoris 11/30/2009   Cardiac pacemaker in situ 11/30/2009    Past Surgical History:  Procedure Laterality Date   ABDOMINAL ANGIOGRAM  04/30/2013   Procedure: ABDOMINAL ANGIOGRAM;  Surgeon: Mal Misty, MD;  Location: [facility] CATH LAB;  Service: Cardiovascular;;   AMPUTATION Left 05/05/2013   Procedure: AMPUTATION RAY- FIRST AND SECOND-LEFT FOOT;   Surgeon: Newt Minion, MD;  Location: [facility];  Service: Orthopedics;  Laterality: Left;   CARDIAC CATHETERIZATION  04/29/2002   Dr. Glade Lloyd   CORONARY ANGIOPLASTY     CORONARY ARTERY BYPASS GRAFT  4 of 2004   4 vessel bypass   ESOPHAGOGASTRODUODENOSCOPY  07/21/2011   Procedure: ESOPHAGOGASTRODUODENOSCOPY (EGD);  Surgeon: Beryle Beams, MD;  Location: [facility];  Service: Endoscopy;  Laterality: N/A;   FEMORAL BYPASS  july 2006   right with right great toe amputation   FEMORAL-POPLITEAL BYPASS GRAFT Left 05/01/2013   Procedure: LEFT FEMORAL TO ABOVE KNEE POPLITEAL ARTERY BYPASS GRAFT WITH INTRAOPERATIVE ARTEROGRAM;  Surgeon: Mal Misty, MD;  Location: [facility];  Service: Vascular;  Laterality: Left;   INSERT / REPLACE / REMOVE PACEMAKER  05/17/2005   [facility] dual chamber ddd mode    LOWER EXTREMITY ANGIOGRAM N/A 04/30/2013   Procedure: LOWER EXTREMITY ANGIOGRAM;  Surgeon: Mal Misty, MD;  [facility] C[facility] LAB;  Service: Cardiovascular;  Laterality: N/A;   PACEMAKER LEAD REMOVAL Right 10/27/2013   Procedure: PACEMAKER LEAD REMOVAL;  Surgeon: Evans Lance, MD;  Location: [facility];  Service: Cardiovascular;  Laterality: Right;  Roxy Manns backing up   TEE WITHOUT CARDIOVERSION N/A 10/23/2013   Procedure: TRANSESOPHAGEAL ECHOCARDIOGRAM (TEE);  Surgeon: Dorothy Spark, MD;  Location: [facility];  Service: Cardiovascular;  Laterality: N/A;   TEE WITHOUT CARDIOVERSION N/A 10/28/2013   Procedure: TRANSESOPHAGEAL ECHOCARDIOGRAM (TEE);  Surgeon: Dorothy Spark, MD;  Location[facility] ENDOSCOPY;  Service: Cardiovascular;  Laterality: N/A;        Home Medications    Prior to Admission medications   Medication Sig Start Date End Date Taking? Authorizing Provider  amLODipine (NORVASC) 2.5 MG tablet Take 2.5 mg daily by mouth. 10/25/16   [provider]  aspirin EC 81 MG tablet Take 81 mg by mouth daily.    [provider]  ezetimibe (ZETIA) 10 MG tablet Take 1 tablet (10  mg total) by mouth daily. 11/17/15   Nahser, Wonda Cheng, MD  ferrous sulfate 325 (65 FE) MG tablet Take 1 tablet (325 mg total) by mouth daily with breakfast. 10/31/13   Delfina Redwood, MD  furosemide (LASIX) 40 MG tablet TAKE 1 TABLET BY MOUTH EVERY DAY 01/17/17   Evans Lance, MD  hydrALAZINE (APRESOLINE) 10 MG tablet Take 1 tablet (10 mg total) by mouth every 8 (eight) hours. Please schedule an appt for future refills. 2nd attempt. 03/01/18   Nahser, Wonda Cheng, MD  isosorbide mononitrate (IMDUR) 30 MG 24 hr tablet Take 1 tablet (30 mg total) by mouth daily. ** DO NOT CRUSH **please make overdue appt with Dr. Acie Fredrickson before anymore refills. 3rd attempt 01/31/18   Nahser, Wonda Cheng, MD  LANTUS SOLOSTAR 100 UNIT/ML Solostar Pen Inject 10 Units at bedtime into the skin. 11/02/16   [provider]  Omega-3 Fatty Acids (FISH OIL) 1000 MG CAPS Take 1,000 mg by mouth daily.     [provider]  vitamin B-12 (CYANOCOBALAMIN) 1000 MCG tablet Take 1,000 mcg by  mouth daily.    [provider]    Family History Family History  Problem Relation Age of Onset   Hyperlipidemia Mother    Heart disease Father    Diabetes Sister    Hypertension Sister    Hyperlipidemia Sister    Diabetes Unknown     Social History Social History   Tobacco Use   Smoking status: Former Smoker    Packs/day: 1.00    Years: 50.00    Pack years: 50.00    Types: Cigarettes    Last attempt to quit: 06/09/2002    Years since quitting: 15.8   Smokeless tobacco: Never Used  Substance Use Topics   Alcohol use: No   Drug use: No     Allergies   Lisinopril   Review of Systems Review of Systems  Unable to perform ROS: Dementia  Psychiatric/Behavioral: Positive for confusion.     Physical Exam Updated Vital Signs BP (!) 153/69    Pulse 63    Temp 98.7 F (37.1 C) (Oral)    Resp 15    Ht 5\' 9"  (1.753 m)    Wt 70.7 kg    SpO2 100%    BMI 23.02 kg/m   Physical Exam Vitals signs  and nursing note reviewed.  Constitutional:      General: He is not in acute distress.    Appearance: He is well-developed. He is not diaphoretic.  HENT:     Head: Normocephalic and atraumatic.  Neck:     Musculoskeletal: Normal range of motion and neck supple.  Cardiovascular:     Rate and Rhythm: Normal rate and regular rhythm.     Heart sounds: No murmur. No friction rub.  Pulmonary:     Effort: Pulmonary effort is normal. No respiratory distress.     Breath sounds: Normal breath sounds. No wheezing or rales.  Abdominal:     General: Bowel sounds are normal. There is no distension.     Palpations: Abdomen is soft.     Tenderness: There is no abdominal tenderness.  Musculoskeletal: Normal range of motion.  Skin:    General: Skin is warm and dry.  Neurological:     Mental Status: He is alert and oriented to person, place, and time.     Cranial Nerves: No cranial nerve deficit.     Coordination: Coordination normal.     Comments: Patient moves all 4 extremities.  He is somewhat slow to respond, but does follow commands appropriately.  He does seem to be somewhat confused and disoriented.      ED Treatments / Results  Labs (all labs ordered are listed, but only abnormal results are displayed) Labs Reviewed  CBG MONITORING, ED - Abnormal; Notable for the following components:      Result Value   Glucose-Capillary 132 (*)    All other components within normal limits  COMPREHENSIVE METABOLIC PANEL  CBC WITH DIFFERENTIAL/PLATELET  ETHANOL  URINALYSIS, ROUTINE W REFLEX MICROSCOPIC    EKG EKG Interpretation  Date/Time:  Sunday April 14 2018 00:40:01 EDT Ventricular Rate:  64 PR Interval:    QRS Duration: 123 QT Interval:  426 QTC Calculation: 440 R Axis:   -37 Text Interpretation:  Sinus rhythm Atrial premature complexes in couplets Borderline prolonged PR interval Left bundle branch block Confirmed by Veryl Speak [phone]) on 04/14/2018 1:00:37 AM   Radiology No  results found.  Procedures Procedures (including critical care time)  Medications Ordered in ED Medications  sodium chloride 0.9 %  bolus 500 mL (has no administration in time range)     Initial Impression / Assessment and Plan / ED Course  I have reviewed the triage vital signs and the nursing notes.  Pertinent labs & imaging results that were available during my care of the patient were reviewed by me and considered in my medical decision making (see chart for details).  Patient presents here with complaints of weakness and confusion that seem to be worsening over the past week.  The patient has fallen on several occasions.  Patient's work-up today reveals an unchanged EKG, but mildly positive troponin in the absence of any chest pain.  He also had a negative head CT, and laboratory studies which show azotemia, acute on chronic renal failure.  Urinalysis suggestive of a UTI.  Patient arrived here somewhat disheveled and unkempt.  I have told he had no clean clothes at home and with close he was wearing were covered with urine and feces.  It appears as though the depends he had on had been there for quite some time.  It is obvious that this patient does not have adequate help at home.  Patient will be admitted for the above medical conditions.  He will also likely require home assistance.  While he is in the hospital, he will be seen by case management who will see what assistance they can offer.  I have spoken with the patient's daughter, Luanna Salk and updated her on his condition.  She tells me she has been attempting to make arrangements for him to have more help, however this has been thus far unsuccessful.  Final Clinical Impressions(s) / ED Diagnoses   Final diagnoses:  None    ED Discharge Orders    None       Veryl Speak, MD 04/14/18 [phone]

## 2018-04-14 NOTE — Progress Notes (Signed)
Page to MD  3e04 [place] no diet order; please enter if unintentional.

## 2018-04-14 NOTE — Consult Note (Addendum)
Cardiology Consultation:   Patient ID: Colin [name] MRN: [id number]; DOB: Oct 14, 1928  Admit date: 04/14/2018 Date of Consult: 04/14/2018  Primary Care Provider: Deland Pretty, MD Primary Cardiologist: No primary care provider on file.  Primary Electrophysiologist:  None    Patient Profile:   Colin [name] is a 85 y.o. male with a hx of CAD s/p CABG 9470, chronic systolic CHF, ICM, SSS s/p pacemaker 04/2005 by Dr. Glade Lloyd with device leads extraction in 2015 due to vegetation/endocarditis, carotid artery disease, PAD s/p L fem to above knee popliteal bypass 4/15 and L toe amputation, HTN, HLD, DM, CKD stage IV, dementia, metastatic prostate CA, anemia and CVA who is being seen today for the evaluation of positive troponin at the request of Dr. Tamala Julian.  History of Present Illness:   Colin [name] is 85 yo male with PMH of CAD s/p CABG 9628, chronic systolic CHF, ICM, SSS s/p pacemaker 04/2005 by Dr. Glade Lloyd with device leads extraction in 2015 due to vegetation/endocarditis, carotid artery disease, PAD s/p L fem to above knee popliteal bypass 4/15 and L toe amputation, HTN, HLD, DM, CKD stage IV, dementia, metastatic prostate CA, anemia and CVA. Since bypass surgery, he has not had another cardiac catheterization. Last echocardiogram obtained on 10/28/2013 showed EF 30 to 35%.  Due to infected pacemaker lead, he underwent pacemaker system extraction on 10/27/2013 included all but approximately 2 cm of the ventricular lead which was firmly embedded in the right ventricular septum. He did not undergo pacemaker reimplantation as he was not considered a pacemaker dependent patient. He wore a heart monitor in August 2018 which revealed a sinus bradycardia during the daytime down to 40 bpm.  He was asymptomatic with this.  His last office visit with Dr. Lovena Le was on 09/15/2016, given the asymptomatic nature, it was recommended to continue to monitor. His last office visit with our oncology team was on  02/13/2018, his overall condition was unchanged at that time.  Because his metastatic cancer is hormone sensitive, the plan is to continue Lupron every 10-month.  Patient presented [facility] early this morning shortly after midnight was worsening weakness, poor appetite, decreased oral intake and the increased altered mental status.  He was found to have urinary tract infection.  EKG showed lateral T wave inversion and possible J-point elevation in anterior leads.  Code STEMI was initially called however after the case was reviewed by cardiology fellow overnight, it was felt he did not have a STEMI.  The code was subsequently canceled.  Troponin was borderline elevated at 0.1 followed by 0.12.  Since admission, he has been treated with antibiotic and IV fluid.  His mental status has improved, however continues to have significant baseline dementia.  He does not remember the year or the president, he cannot recall his name and the today is Sunday.  With regard to chest discomfort, patient is a extremely poor historian, he says sometimes he does have chest discomfort however he cannot recall when the last time he had any.  Cardiology has been consulted for elevated troponin in the setting of urinary tract infection and altered mental status.   Past Medical History:  Diagnosis Date   Anemia 10/29/2013   Arthritis    Bradycardia    Cardiomyopathy    Carotid artery disease ([place])    a. Carotid US 2/17: R 50-69, L plaque.   Chronic systolic CHF (congestive heart failure) (HCC)    CKD (chronic kidney disease), stage IV ([place])  COPD (chronic obstructive pulmonary disease) (HCC)    Coronary artery disease    Status post CABG in 2004   Dementia [facility])    Diabetes mellitus with peripheral artery disease (HCC)    Dyslipidemia    GERD (gastroesophageal reflux disease)    History of anemia    History of CVA (cerebrovascular accident)    Hypertension    Myocardial infarction [facility])     Peripheral vascular disease ([place])    Status post right femoropopliteal bypass   Prostate cancer ([place])    metastatic   Prostate cancer ([place]) 12/11/2016   Prostate cancer ([place]) 12/11/2016   SSS (sick sinus syndrome) ([place])    a. s/p PPM 2007 with lead vegetation in 2015 s/p extraction of pacemaker leads.    Past Surgical History:  Procedure Laterality Date   ABDOMINAL ANGIOGRAM  04/30/2013   Procedure: ABDOMINAL ANGIOGRAM;  Surgeon: Mal Misty, MD;  Location: [facility] CATH LAB;  Service: Cardiovascular;;   AMPUTATION Left 05/05/2013   Procedure: AMPUTATION RAY- FIRST AND SECOND-LEFT FOOT;  Surgeon: Newt Minion, MD;  Location: [place];  Service: Orthopedics;  Laterality: Left;   CARDIAC CATHETERIZATION  04/29/2002   Dr. Glade Lloyd   CORONARY ANGIOPLASTY     CORONARY ARTERY BYPASS GRAFT  4 of 2004   4 vessel bypass   ESOPHAGOGASTRODUODENOSCOPY  07/21/2011   Procedure: ESOPHAGOGASTRODUODENOSCOPY (EGD);  Surgeon: Beryle Beams, MD;  Location: [facility] ENDOSCOPY;  Service: Endoscopy;  Laterality: N/A;   FEMORAL BYPASS  july 2006   right with right great toe amputation   FEMORAL-POPLITEAL BYPASS GRAFT Left 05/01/2013   Procedure: LEFT FEMORAL TO ABOVE KNEE POPLITEAL ARTERY BYPASS GRAFT WITH INTRAOPERATIVE ARTEROGRAM;  Surgeon: Mal Misty, MD;  Location: [place];  Service: Vascular;  Laterality: Left;   INSERT / REPLACE / REMOVE PACEMAKER  05/17/2005   st. jude dual chamber ddd mode    LOWER EXTREMITY ANGIOGRAM N/A 04/30/2013   Procedure: LOWER EXTREMITY ANGIOGRAM;  Surgeon: Mal Misty, MD;  Location: [facility] C[facility] LAB;  Service: Cardiovascular;  Laterality: N/A;   PACEMAKER LEAD REMOVAL Right 10/27/2013   Procedure: PACEMAKER LEAD REMOVAL;  Surgeon: Evans Lance, MD;  Location: [place];  Service: Cardiovascular;  Laterality: Right;  Roxy Manns backing up   TEE WITHOUT CARDIOVERSION N/A 10/23/2013   Procedure: TRANSESOPHAGEAL ECHOCARDIOGRAM (TEE);  Surgeon: Dorothy Spark, MD;  Location: [place];  Service: Cardiovascular;  Laterality: N/A;   TEE WITHOUT CARDIOVERSION N/A 10/28/2013   Procedure: TRANSESOPHAGEAL ECHOCARDIOGRAM (TEE);  Surgeon: Dorothy Spark, MD;  Location: [facility] ENDOSCOPY;  Service: Cardiovascular;  Laterality: N/A;     Home Medications:  Prior to Admission medications   Medication Sig Start Date End Date Taking? Authorizing Provider  amLODipine (NORVASC) 2.5 MG tablet Take 2.5 mg daily by mouth. 10/25/16  Yes [provider]  aspirin EC 81 MG tablet Take 81 mg by mouth daily.   Yes [provider]  ezetimibe (ZETIA) 10 MG tablet Take 1 tablet (10 mg total) by mouth daily. 11/17/15  Yes Mylan Lengyel, Wonda Cheng, MD  ferrous sulfate 325 (65 FE) MG tablet Take 1 tablet (325 mg total) by mouth daily with breakfast. 10/31/13  Yes Delfina Redwood, MD  furosemide (LASIX) 40 MG tablet TAKE 1 TABLET BY MOUTH EVERY DAY 01/17/17  Yes Evans Lance, MD  hydrALAZINE (APRESOLINE) 10 MG tablet Take 1 tablet (10 mg total) by mouth every 8 (eight) hours. Please schedule an appt for future refills. 2nd attempt. 03/01/18  Yes Marolyn Urschel, Wonda Cheng, MD  isosorbide mononitrate (IMDUR) 30 MG 24 hr tablet Take 1 tablet (30 mg total) by mouth daily. ** DO NOT CRUSH **please make overdue appt with Dr. Acie Fredrickson before anymore refills. 3rd attempt 01/31/18  Yes Sheran Newstrom, Wonda Cheng, MD  LANTUS SOLOSTAR 100 UNIT/ML Solostar Pen Inject 10 Units at bedtime into the skin. 11/02/16  Yes [provider]  Omega-3 Fatty Acids (FISH OIL) 1000 MG CAPS Take 1,000 mg by mouth daily.    Yes [provider]  vitamin B-12 (CYANOCOBALAMIN) 1000 MCG tablet Take 1,000 mcg by mouth daily.   Yes [provider]    Inpatient Medications: Scheduled Meds:  amLODipine  2.5 mg Oral Daily   aspirin EC  81 mg Oral Daily   ezetimibe  10 mg Oral Daily   ferrous sulfate  325 mg Oral Q breakfast   furosemide  40 mg Oral Daily   heparin  5,000 Units Subcutaneous Q8H    hydrALAZINE  10 mg Oral Q8H   insulin aspart  0-9 Units Subcutaneous TID WC   insulin glargine  6 Units Subcutaneous QHS   isosorbide mononitrate  30 mg Oral Daily   omega-3 acid ethyl esters  1 g Oral Daily   vitamin B-12  1,000 mcg Oral Daily   Continuous Infusions:  sodium chloride 250 mL (04/14/18 0834)   cefTRIAXone (ROCEPHIN)  IV 1 g (04/14/18 0836)   PRN Meds: sodium chloride, acetaminophen, albuterol, hydrALAZINE, ondansetron (ZOFRAN) IV  Allergies:    Allergies  Allergen Reactions   Lisinopril Other (See Comments)    REACTION:  Elevated potassium,renal insuf    Social History:   Social History   Socioeconomic History   Marital status: Married    Spouse name: Not on file   Number of children: Not on file   Years of education: Not on file   Highest education level: Not on file  Occupational History   Not on file  Social Needs   Financial resource strain: Not on file   Food insecurity:    Worry: Not on file    Inability: Not on file   Transportation needs:    Medical: Not on file    Non-medical: Not on file  Tobacco Use   Smoking status: Former Smoker    Packs/day: 1.00    Years: 50.00    Pack years: 50.00    Types: Cigarettes    Last attempt to quit: 06/09/2002    Years since quitting: 15.8   Smokeless tobacco: Never Used  Substance and Sexual Activity   Alcohol use: No   Drug use: No   Sexual activity: Not Currently  Lifestyle   Physical activity:    Days per week: Not on file    Minutes per session: Not on file   Stress: Not on file  Relationships   Social connections:    Talks on phone: Not on file    Gets together: Not on file    Attends religious service: Not on file    Active member of club or organization: Not on file    Attends meetings of clubs or organizations: Not on file    Relationship status: Not on file   Intimate partner violence:    Fear of current or ex partner: Not on file    Emotionally abused: Not  on file    Physically abused: Not on file    Forced sexual activity: Not on file  Other Topics Concern   Not on  file  Social History Narrative   Lives in a house.  Lives with his wife.  Uses a cane for ambulation.      Family History:    Family History  Problem Relation Age of Onset   Hyperlipidemia Mother    Heart disease Father    Diabetes Sister    Hypertension Sister    Hyperlipidemia Sister    Diabetes Unknown      ROS:  Please see the history of present illness.   All other ROS reviewed and negative.     Physical Exam/Data:   Vitals:   04/14/18 0445 04/14/18 0500 04/14/18 0558 04/14/18 0915  BP:  (!) 133/59 (!) 152/69 (!) 138/58  Pulse: 65 (!) 59 62 (!) 113  Resp: 20 15 18 18   Temp:   98 F (36.7 C) (!) 97.5 F (36.4 C)  TempSrc:   Oral Oral  SpO2: 100% 100% 100% 91%  Weight:      Height:        Intake/Output Summary (Last 24 hours) at 04/14/2018 0943 Last data filed at 04/14/2018 0700 Gross per 24 hour  Intake 500 ml  Output --  Net 500 ml   Last 3 Weights 04/14/2018 02/13/2018 11/06/2017  Weight (lbs) 155 lb 13.8 oz 155 lb 14.4 oz 159 lb 14.4 oz  Weight (kg) 70.7 kg 70.716 kg 72.53 kg     Body mass index is 23.02 kg/m.  General:  Well nourished, well developed, in no acute distress HEENT: normal Lymph: no adenopathy Neck: no JVD Endocrine:  No thryomegaly Vascular: No carotid bruits; FA pulses 2+ bilaterally without bruits  Cardiac:  normal S1, S2; RRR; no murmur  Lungs:  clear to auscultation bilaterally, no wheezing, rhonchi or rales  Abd: soft, nontender, no hepatomegaly  Ext: no edema Musculoskeletal:  No deformities, BUE and BLE strength normal and equal Skin: warm and dry  Neuro: A&O x 1 Psych:  Normal affect   EKG:  The EKG was personally reviewed and demonstrates: Normal sinus rhythm with T wave inversion in the lateral leads Telemetry:  Telemetry was personally reviewed and demonstrates: Normal sinus rhythm with a heart rate in  the 50s to 60s.  Relevant CV Studies:  Echo 10/28/2013 Study Conclusions  - Left ventricle: Systolic function was moderately to severely reduced. The estimated ejection fraction was in the range of 30% to 35%. Wall motion was normal; there were no regional wall motion abnormalities. - Aortic valve: No evidence of vegetation. - Left atrium: The atrium was dilated. No evidence of thrombus in the atrial cavity or appendage. No evidence of thrombus in the atrial cavity or appendage. - Right atrium: No evidence of thrombus in the atrial cavity or appendage.  Impressions:  - There is a retained portion of the RV lead located at the RV apex with no remnant insulation or vegetation present. The lead measures 3 cm. There is no vegetation seen on the tricuspid or pulmonic valve. A central catheter is present in the right atrium.  Laboratory Data:  Chemistry Recent Labs  Lab 04/14/18 0101  NA 137  K 5.0  CL 109  CO2 20*  GLUCOSE 142*  BUN 105*  CREATININE 4.18*  CALCIUM 9.4  GFRNONAA 12*  GFRAA 14*  ANIONGAP 8    Recent Labs  Lab 04/14/18 0101  PROT 7.2  ALBUMIN 3.1*  AST 30  ALT 24  ALKPHOS 71  BILITOT 0.5   Hematology Recent Labs  Lab 04/14/18 0101  WBC 5.4  RBC 3.78*  HGB 9.2*  HCT 31.3*  MCV 82.8  MCH 24.3*  MCHC 29.4*  RDW 16.4*  PLT 215   Cardiac Enzymes Recent Labs  Lab 04/14/18 0101 04/14/18 0505  TROPONINI 0.12* 0.10*   No results for input(s): TROPIPOC in the last 168 hours.  BNPNo results for input(s): BNP, PROBNP in the last 168 hours.  DDimer No results for input(s): DDIMER in the last 168 hours.  Radiology/Studies:  Dg Chest 2 View  Result Date: 04/14/2018 CLINICAL DATA:  Altered mental status. EXAM: CHEST - 2 VIEW COMPARISON:  09/17/2015 FINDINGS: Postsurgical changes from CABG. Cardiomediastinal silhouette is mildly enlarged. Mediastinal contours appear intact. Eventration of bilateral hemidiaphragms. There is no  evidence of focal airspace consolidation, pleural effusion or pneumothorax. Bilateral lower lobe atelectasis. Osseous structures are without acute abnormality. Soft tissues are grossly normal. IMPRESSION: 1. Bilateral lower lobe atelectasis. 2. Mildly enlarged cardiac silhouette. Electronically Signed   By: Fidela Salisbury M.D.   On: 04/14/2018 01:31   Ct Head Wo Contrast  Result Date: 04/14/2018 CLINICAL DATA:  Altered level of consciousness, history prostate cancer, cardiomyopathy, dementia, coronary artery disease post MI, CHF, diabetes mellitus, COPD, hypertension, stroke EXAM: CT HEAD WITHOUT CONTRAST TECHNIQUE: Contiguous axial images were obtained from the base of the skull through the vertex without intravenous contrast. Sagittal and coronal MPR images reconstructed from axial data set. COMPARISON:  02/20/2015 FINDINGS: Brain: Generalized atrophy. Normal ventricular morphology. No midline shift or mass effect. Small vessel chronic ischemic changes of deep cerebral white matter. Small old LEFT frontal infarct. No intracranial hemorrhage, mass lesion, evidence of acute infarction, or extra-axial fluid collection. Vascular: Atherosclerotic calcifications of internal carotid and vertebral arteries at skull base Skull: Calvaria intact. Mixed lytic and sclerotic process identified within the sphenoid from central to LEFT obliterating the majority of the sphenoid sinus, unchanged from prior exams, likely a benign process such as Paget's disease. Sinuses/Orbits: Paranasal sinuses and mastoid air cells clear Other: N/A IMPRESSION: Atrophy with small vessel chronic ischemic changes of deep cerebral white matter. Small old LEFT frontal infarct. No acute intracranial abnormalities. 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   On: 04/14/2018 02:29    Assessment and Plan:   1. Elevated troponin: In the setting of urinary tract infection and altered mental status.   Although patient does endorse intermittent chest discomfort, however he cannot tell me very clearly often he has this.  He does mention he does not think there was anything significant with the intermittent chest discomfort.  Apparently this has been going on for at least past 1 to 2 years.  He continued to have significant baseline dementia despite recovering from the altered mental status.  He is not a very good candidate for PCI as I am not sure he would be very compliant with dual antiplatelet therapy.  Will discuss with family, may consider echocardiogram versus stress test.  2. Altered mental status: I have reviewed his telemetry, heart rate has been in the 50s to low 60s range, during the hospitalization I have not seen any significant bradycardia to explain the altered mental status.  It is likely more related to the urinary tract infection  3. Urinary tract infection: Receiving antibiotic.  4. CAD s/p CABG in 2004: No further intervention since then.  5. Sick sinus syndrome status post pacemaker in April 2007.  Subsequent pacemaker extraction in 2015 due to endocarditis and vegetation on the pacemaker lead.  Pacemaker  was not reimplanted afterward since patient was asymptomatic despite occasional bradycardia down to the 40s on the heart monitor.  6. Chronic diastolic heart failure  7. PAD: Denies any claudication symptoms.  8. Hypertension  9. Hyperlipidemia  10. DM 2  11. CKD stage IV: Worsening renal function, creatinine trended up to 4.18.  It was 2.8 nine months ago.  -If renal function does not improve with hydration, consider decrease home Lasix to 20 mg daily.  12. Metastatic prostate cancer: Hormone sensitive, receiving Lupron every 3 months  13. Dementia: Patient is an extremely poor historian.      For questions or updates, please contact [place] Please consult [email or web address] for contact info under     Hilbert Corrigan, [place]  04/14/2018 9:43 AM   Attending  Note:   The patient was seen and examined.  Agree with assessment and plan as noted above.  Changes made to the above note as needed.  Patient seen and independently examined with Almyra Deforest, PA .   We discussed all aspects of the encounter. I agree with the assessment and plan as stated above.  1.  Elevated Troponin:   In the setting of a UTI and altered mental status.  He has at least moderate dementia.   Very poor historian .  I think he is a poor candidate for invasive cardiac procedure  His troponin elevation is almost certainly related to his underlying UTI and his acute on chronic renal insufficiency  .   He is not having any angina. I would not suggest any further cardiac testing .  Would treat his UTI which may help with his acute on chronic kidney disease.     I have spent a total of 40 minutes with patient reviewing hospital  notes , telemetry, EKGs, labs and examining patient as well as establishing an assessment and plan that was discussed with the patient. > 50% of time was spent in direct patient care.  [facility] will sign off.   Medication Recommendations:  Continue medical therpy  Other recommendations (labs, testing, etc):   Follow up as an outpatient:   With Richardson Dopp, PA and Siddhi Dornbush    Thayer Headings, Brooke Bonito., MD, [facility] 04/14/2018, 1:45 PM [address],  [place] Pager [phone]

## 2018-04-14 NOTE — Progress Notes (Signed)
Patient's daughter Mateo Flow calls for update on patient, she is a credible historian and provides confirmation of patient's home medications as well as requests hydralazine and imdur refills be sent to pharmacy *[facility]* given lack of OV availability right now with clinic restrictions.    Per daughter pt does have dementia at baseline, his disoriented to situation/time is baseline.   Per daughter pt does not have difficulty swallowing, and typically eats independently.

## 2018-04-14 NOTE — Progress Notes (Signed)
Pt with 5 beat run of VTach per CCMD, pt in SB/SR 54-60 upon receipt of notification. Pt is sleeping; denies complaints/asymptomatic.

## 2018-04-14 NOTE — Progress Notes (Signed)
Per MD no bladder scan necessary if pt has UOP.   Diet order received for pt, tray requested.

## 2018-04-14 NOTE — Progress Notes (Signed)
Low bed ordered for patient

## 2018-04-14 NOTE — Progress Notes (Signed)
Patient resting comfortably during shift report. Denies complaints.  

## 2018-04-14 NOTE — H&P (Addendum)
History and Physical    Colin [name] OIN:[id number] DOB: [birth year] DOA: 04/14/2018  Referring MD/NP/PA:   PCP: Deland Pretty, MD   Patient coming from:  The patient is coming from home.  At baseline, pt is independent for most of ADL.        Chief Complaint: Generalized weakness, fall, urinary incontinence, confusion  HPI: Colin [name] is a 85 y.o. 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  Per EDP, pt's daughter reported that pt has been having generalized weakness, poor appetite, decreased oral intake, being more confused recently. He fell several times recently, including fall happened this evening. Per EMS, pt soaked in urine and when family asked for clean clothes. They said all of his clothing is soiled.  Pt smells of pungent urine. When I saw pt in ED, he is alert, but confused. He knows his own name clearly, but not is oriented to place and time.  He does not have active cough, respiratory distress, nausea, vomiting, diarrhea.  He moves all extremities normally.  He states that he had some chest pain recently, but not today.  He also said he had some discomfort in urinating, but cannot give detailed information.  Patient EKG showed T wave inversion in lateral leads and J-point elevation in V1-V4.  Code STEMI was called at arrival.  Dr. Charissa Bash evaluated patient, who did not think patient has STEMI since pt does not have any chest pain.  ED Course: pt was found to have elevated troponin 0.12, positive urinalysis for UTI (hazy appearance, small amount of leukocyte, many bacteria, WBC 11-20), worsening renal function, temperature normal, heart rate  60s, respiration rate 15, oxygen saturation 100% on room air, chest x-ray showed cardiomegaly and a bilateral lower lobe atelectasis.  CT head showed small old left frontal infarction, but no acute intracranial abnormalities.  Patient is placed on telemetry bed for observation.  Review of Systems: Could not be reviewed accurately due to confusion.  Allergy:  Allergies  Allergen Reactions  . Lisinopril Other (See Comments)    REACTION:  Elevated potassium,renal insuf    Past Medical History:  Diagnosis Date  . Anemia 10/29/2013  . Arthritis   . Bradycardia   . Cardiomyopathy   . Carotid artery disease ([place])    a. Carotid US 2/17: R 50-69, L plaque.  . Chronic systolic CHF (congestive heart failure) ([place])   . CKD (chronic kidney disease), stage IV ([place])   . COPD (chronic obstructive pulmonary disease) ([place])   . Coronary artery disease    Status post CABG in 2004  . Dementia ([place])   . Diabetes mellitus with peripheral artery disease ([place])   . Dyslipidemia   . GERD (gastroesophageal reflux disease)   . History of anemia   . History of CVA (cerebrovascular accident)   . Hypertension   . Myocardial infarction ([place])   . Peripheral vascular disease ([place])    Status post right femoropopliteal bypass  . Prostate cancer ([place])    metastatic  . Prostate cancer ([place]) 12/11/2016  . Prostate cancer ([place]) 12/11/2016  . SSS (sick sinus syndrome) ([place])    a. s/p PPM 2007 with lead vegetation in 2015 s/p extraction of pacemaker leads.    Past Surgical History:  Procedure Laterality Date  . ABDOMINAL ANGIOGRAM  04/30/2013   Procedure: ABDOMINAL ANGIOGRAM;  Surgeon: Mal Misty, MD;  Location: [facility] CATH LAB;  Service: Cardiovascular;;  . AMPUTATION Left 05/05/2013   Procedure: AMPUTATION RAY- FIRST AND SECOND-LEFT FOOT;  Surgeon: Newt Minion, MD;  Location: [place];  Service: Orthopedics;  Laterality: Left;  . CARDIAC CATHETERIZATION  04/29/2002   Dr. Glade Lloyd  . CORONARY ANGIOPLASTY    .  CORONARY ARTERY BYPASS GRAFT  4 of 2004   4 vessel bypass  . ESOPHAGOGASTRODUODENOSCOPY  07/21/2011   Procedure: ESOPHAGOGASTRODUODENOSCOPY (EGD);  Surgeon: Beryle Beams, MD;  Location: [facility];  Service: Endoscopy;  Laterality: N/A;  . FEMORAL BYPASS  july 2006   right with right great toe amputation  . FEMORAL-POPLITEAL BYPASS GRAFT Left 05/01/2013   Procedure: LEFT FEMORAL TO ABOVE KNEE POPLITEAL ARTERY BYPASS GRAFT WITH INTRAOPERATIVE ARTEROGRAM;  Surgeon: Mal Misty, MD;  Location: [place];  Service: Vascular;  Laterality: Left;  . INSERT / REPLACE / REMOVE PACEMAKER  05/17/2005   st. jude dual chamber ddd mode   . LOWER EXTREMITY ANGIOGRAM N/A 04/30/2013   Procedure: LOWER EXTREMITY ANGIOGRAM;  Surgeon: Mal Misty, MD;  Location: [facility] CATH LAB;  Service: Cardiovascular;  Laterality: N/A;  . PACEMAKER LEAD REMOVAL Right 10/27/2013   Procedure: PACEMAKER LEAD REMOVAL;  Surgeon: Evans Lance, MD;  Location: [place];  Service: Cardiovascular;  Laterality: Right;  Roxy Manns backing up  . TEE WITHOUT CARDIOVERSION N/A 10/23/2013   Procedure: TRANSESOPHAGEAL ECHOCARDIOGRAM (TEE);  Surgeon: Dorothy Spark, MD;  Location: [place];  Service: Cardiovascular;  Laterality: N/A;  . TEE WITHOUT CARDIOVERSION N/A 10/28/2013   Procedure: TRANSESOPHAGEAL ECHOCARDIOGRAM (TEE);  Surgeon: Dorothy Spark, MD;  Location: [facility] ENDOSCOPY;  Service: Cardiovascular;  Laterality: N/A;    Social History:  reports that he quit smoking about 15 years ago. His smoking use included cigarettes. He has a 50.00 pack-year smoking history. He has never used smokeless tobacco. He reports that he does not drink alcohol or use drugs.  Family History:  Family History  Problem Relation Age of Onset  . Hyperlipidemia Mother   . Heart disease Father   . Diabetes Sister   . Hypertension Sister   . Hyperlipidemia Sister   . Diabetes Unknown      Prior to Admission medications   Medication Sig Start Date End Date  Taking? Authorizing Provider  amLODipine (NORVASC) 2.5 MG tablet Take 2.5 mg daily by mouth. 10/25/16   [provider]  aspirin EC 81 MG tablet Take 81 mg by mouth daily.    [provider]  ezetimibe (ZETIA) 10 MG tablet Take 1 tablet (10 mg total) by mouth daily. 11/17/15   Nahser, Wonda Cheng, MD  ferrous sulfate 325 (65 FE) MG tablet Take 1 tablet (325 mg total) by mouth daily with breakfast. 10/31/13   Delfina Redwood, MD  furosemide (LASIX) 40 MG tablet TAKE 1 TABLET BY MOUTH EVERY DAY 01/17/17   Evans Lance, MD  hydrALAZINE (APRESOLINE) 10 MG tablet Take 1 tablet (10 mg total) by mouth every 8 (eight) hours. Please schedule an appt for future refills. 2nd attempt. 03/01/18   Nahser, Wonda Cheng, MD  isosorbide mononitrate (IMDUR) 30 MG 24 hr tablet Take 1 tablet (30 mg total) by mouth daily. ** DO NOT CRUSH **please make overdue appt with Dr. Acie Fredrickson before anymore refills. 3rd attempt 01/31/18   Nahser, Wonda Cheng, MD  LANTUS SOLOSTAR 100 UNIT/ML  Solostar Pen Inject 10 Units at bedtime into the skin. 11/02/16   [provider]  Omega-3 Fatty Acids (FISH OIL) 1000 MG CAPS Take 1,000 mg by mouth daily.     [provider]  vitamin B-12 (CYANOCOBALAMIN) 1000 MCG tablet Take 1,000 mcg by mouth daily.    [provider]    Physical Exam: Vitals:   04/14/18 0415 04/14/18 0430 04/14/18 0445 04/14/18 0500  BP: 136/80   (!) 133/59  Pulse: (!) 57 (!) 48 65 (!) 59  Resp: 15 13 20 15   Temp:      TempSrc:      SpO2: 100% 100% 100% 100%  Weight:      Height:       General: Not in acute distress HEENT:       Eyes: PERRL, EOMI, no scleral icterus.       ENT: No discharge from the ears and nose, no pharynx injection, no tonsillar enlargement.        Neck: No JVD, no bruit, no mass felt. Heme: No neck lymph node enlargement. Cardiac: S1/S2, RRR, No murmurs, No gallops or rubs. Respiratory: No rales, wheezing, rhonchi or rubs. GI: Soft, nondistended,  nontender, no rebound pain, no organomegaly, BS present. GU: No hematuria Ext: No pitting leg edema bilaterally. 1+DP/PT pulse bilaterally. S/p of all toe amputation in right foot, left great toe and left 3rd toe missing. Musculoskeletal: No joint deformities, No joint redness or warmth, no limitation of ROM in spin. Skin: No rashes.  Neuro: Alert, but confused, know his own name, not oriented to place and time, cranial nerves II-XII grossly intact, moves all extremities. Psych: Patient is not psychotic, no suicidal or hemocidal ideation.  Labs on Admission: I have personally reviewed following labs and imaging studies  CBC: Recent Labs  Lab 04/14/18 0101  WBC 5.4  NEUTROABS 3.4  HGB 9.2*  HCT 31.3*  MCV 82.8  PLT 938   Basic Metabolic Panel: Recent Labs  Lab 04/14/18 0101  NA 137  K 5.0  CL 109  CO2 20*  GLUCOSE 142*  BUN 105*  CREATININE 4.18*  CALCIUM 9.4   GFR: Estimated Creatinine Clearance: 12.5 mL/min (A) (by C-G formula based on SCr of 4.18 mg/dL (H)). Liver Function Tests: Recent Labs  Lab 04/14/18 0101  AST 30  ALT 24  ALKPHOS 71  BILITOT 0.5  PROT 7.2  ALBUMIN 3.1*   No results for input(s): LIPASE, AMYLASE in the last 168 hours. No results for input(s): AMMONIA in the last 168 hours. Coagulation Profile: No results for input(s): INR, PROTIME in the last 168 hours. Cardiac Enzymes: Recent Labs  Lab 04/14/18 0101  TROPONINI 0.12*   BNP (last 3 results) No results for input(s): PROBNP in the last 8760 hours. HbA1C: Recent Labs    04/14/18 0505  HGBA1C 6.4*   CBG: Recent Labs  Lab 04/14/18 0043  GLUCAP 132*   Lipid Profile: No results for input(s): CHOL, HDL, LDLCALC, TRIG, CHOLHDL, LDLDIRECT in the last 72 hours. Thyroid Function Tests: No results for input(s): TSH, T4TOTAL, FREET4, T3FREE, THYROIDAB in the last 72 hours. Anemia Panel: No results for input(s): VITAMINB12, FOLATE, FERRITIN, TIBC, IRON, RETICCTPCT in the last 72  hours. Urine analysis:    Component Value Date/Time   COLORURINE YELLOW 04/14/2018 0340   APPEARANCEUR HAZY (A) 04/14/2018 0340   LABSPEC 1.014 04/14/2018 0340   PHURINE 5.0 04/14/2018 0340   GLUCOSEU NEGATIVE 04/14/2018 0340   HGBUR SMALL (A) 04/14/2018 0340  BILIRUBINUR NEGATIVE 04/14/2018 0340   KETONESUR NEGATIVE 04/14/2018 0340   PROTEINUR 100 (A) 04/14/2018 0340   UROBILINOGEN 1.0 10/19/2013 0912   NITRITE NEGATIVE 04/14/2018 0340   LEUKOCYTESUR SMALL (A) 04/14/2018 0340   Sepsis Labs: @LABRCNTIP (procalcitonin:4,lacticidven:4) )No results found for this or any previous visit (from the past 240 hour(s)).   Radiological Exams on Admission: Dg Chest 2 View  Result Date: 04/14/2018 CLINICAL DATA:  Altered mental status. EXAM: CHEST - 2 VIEW COMPARISON:  09/17/2015 FINDINGS: Postsurgical changes from CABG. Cardiomediastinal silhouette is mildly enlarged. Mediastinal contours appear intact. Eventration of bilateral hemidiaphragms. There is no evidence of focal airspace consolidation, pleural effusion or pneumothorax. Bilateral lower lobe atelectasis. Osseous structures are without acute abnormality. Soft tissues are grossly normal. IMPRESSION: 1. Bilateral lower lobe atelectasis. 2. Mildly enlarged cardiac silhouette. Electronically Signed   By: Fidela Salisbury M.D.   On: 04/14/2018 01:31   Ct Head Wo Contrast  Result Date: 04/14/2018 CLINICAL DATA:  Altered level of consciousness, history prostate cancer, cardiomyopathy, dementia, coronary artery disease post MI, CHF, diabetes mellitus, COPD, hypertension, stroke EXAM: CT HEAD WITHOUT CONTRAST TECHNIQUE: Contiguous axial images were obtained from the base of the skull through the vertex without intravenous contrast. Sagittal and coronal MPR images reconstructed from axial data set. COMPARISON:  02/20/2015 FINDINGS: Brain: Generalized atrophy. Normal ventricular morphology. No midline shift or mass effect. Small vessel chronic  ischemic changes of deep cerebral white matter. Small old LEFT frontal infarct. No intracranial hemorrhage, mass lesion, evidence of acute infarction, or extra-axial fluid collection. Vascular: Atherosclerotic calcifications of internal carotid and vertebral arteries at skull base Skull: Calvaria intact. Mixed lytic and sclerotic process identified within the sphenoid from central to LEFT obliterating the majority of the sphenoid sinus, unchanged from prior exams, likely a benign process such as Paget's disease. Sinuses/Orbits: Paranasal sinuses and mastoid air cells clear Other: N/A IMPRESSION: Atrophy with small vessel chronic ischemic changes of deep cerebral white matter. Small old LEFT frontal infarct. No acute intracranial abnormalities. 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   On: 04/14/2018 02:29     EKG: Independently reviewed.  Sinus rhythm, QTC 440, LAD, T wave inversion in lateral leads, J-point elevation in V1-V4, PAC   Assessment/Plan Principal Problem:   Elevated troponin Active Problems:   Coronary artery disease involving native heart without angina pectoris   Chronic systolic CHF (congestive heart failure) (HCC)   History of CVA (cerebrovascular accident)   CKD (chronic kidney disease), stage IV (HCC)   Fall   Type 2 diabetes, uncontrolled, with renal manifestation (HCC)   Anemia, iron deficiency   Essential hypertension   HLD (hyperlipidemia)   Prostate cancer (HCC)   UTI (urinary tract infection)   Acute metabolic encephalopathy   Elevated troponin and hx of CAD: s/p of CABG. Trop 0.12.  EKG showed T wave inversion in lateral leads and J-point elevation in V1-V4, which is similar to previous EKG 08/03/2016. Pt does not have chest pain or shortness of breath.  He looks comfortable.  Cardiology, Dr. Charissa Bash was consulted initially, who did not think patient had STEMI, but non-STEMI is possible. Pt has CKD-IV, which may have  contributed partially to elevated troponin.  - will place on Tele bed for obs - Aspirin, Zetia - Imdur - Trop x 3-->if significantly elevated, will start IV heparin. - Risk factor stratification: will check FLP and A1C  - did not order 2d echo--> please reevaluate pt in AM  to decide if pt needs 2D echo. - inpt card consult was requested via Epic  Chronic systolic CHF (congestive heart failure) ([place]): 2D echo on 10/28/2013 showed EF 30-35%.  Patient does not have leg edema JVD.  No shortness of breath.  No pulmonary edema chest x-ray.  CHF symptoms compensated. -Continue home dose Lasix.  History of CVA (cerebrovascular accident); -ASA and Zetia  CKD (chronic kidney disease), stage IV [facility]): Patient had creatinine 2.53 on 03/05/2017-->4.40 on 02/13/2018-->4.18, BUN 105. CKD-IV has been worsening. He has UTI which may have contributed at least partially. -Follow-up renal function by BMP  Type 2 diabetes, uncontrolled, with renal manifestation ([place]): Last A1c 7.9 on9/27/15, poorly controled. Patient is taking lantus at home -will decrease Lantus dose from 10 to 6 units daily -SSI  Anemia, iron deficiency: hgb stable, 9.0 on 02/13/18-->9.2 today -Continue iron supplement  HTN:  -Continue home medications: Amlodipine, hydralazine, -On Lasix -IV hydralazine prn  HLD (hyperlipidemia): -zetia  Prostate cancer [facility]): Prostate cancer is metastatic to intrapelvic lymph node. Followed up by Dr. Benay Spice. Pt is getting Lupron injection q3Month -f/u with Dr. Learta Codding  UTI (urinary tract infection): -rocephin IV -f/u Bx and Ux  Acute metabolic encephalopathy and fall: Likely due to multifactorial etiologies above.  CT head is negative for acute intracranial abnormalities.  No focal neurologic findings on physical examination. -Frequent neuro check     DVT ppx: SQ Heparin      Code Status: Full code (this needs to be discussed with POA further in AM) Family Communication: None at bed  side.      Disposition Plan:  Anticipate discharge back to previous home environment Consults called:  Card, Dr. Charissa Bash Admission status: Obs / tele      Date of Service 04/14/2018    [place] [facility]   If 7PM-7AM, please contact night-coverage [email or web address] Password [facility] 04/14/2018, 5:39 AM

## 2018-04-14 NOTE — Evaluation (Signed)
Occupational Therapy Evaluation Patient Details Name: Colin [name] MRN: [id number] DOB: November 17, 1928 Today's Date: 04/14/2018    History of Present Illness  Colin [name] is a 85 y.o. 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   Clinical Impression   Pt was independent in self care and walked with a walker prior to admission, although his ability to care for himself was questionable as he smells of urine and per his daughter has had multiple falls in the last 2 weeks. Pt presents with impaired memory, generalized weakness and decreased balance. He requires min to moderate assistance for ADL and min assist for ambulation. His wife is not able to offer physical assistance due to her own mobility issues. Per daughter, pt had HHPT that ended 2 weeks prior to admission, but pt does not continue to do the exercises necessary to maintain his strength. Recommending ST rehab in SNF.     Follow Up Recommendations  SNF;Supervision/Assistance - 24 hour    Equipment Recommendations       Recommendations for Other Services       Precautions / Restrictions Precautions Precautions: Fall Precaution Comments: daughter reports pt has had 3 falls in the last 2 weeks Restrictions Weight Bearing Restrictions: No Other Position/Activity Restrictions: pt has custom shoes - to help his feel with multiple amputations.      Mobility Bed Mobility Overal bed mobility: Needs Assistance Bed Mobility: Supine to Sit;Sit to Supine     Supine to sit: Min assist Sit to supine: Min assist   General bed mobility comments: increased time, cues for technique, assist to raise  trunk and for LEs back into bed  Transfers Overall transfer level: Needs assistance Equipment used: Rolling walker (2 wheeled) Transfers: Sit to/from Stand Sit to Stand: Min assist;From elevated surface         General transfer comment: min assist to rise a steady, use of momentum    Balance Overall balance assessment: Needs assistance;History of Falls Sitting-balance support: Bilateral upper extremity supported Sitting balance-Leahy Scale: Fair Sitting balance - Comments: LOB with attempt to don socks at EOB     Standing balance-Leahy Scale: Poor Standing balance comment: requires at least one hand supported by walker in static standing                           ADL either performed or assessed with clinical judgement   ADL Overall ADL's : Needs assistance/impaired Eating/Feeding: Minimal assistance;Bed level Eating/Feeding Details (indicate cue type and reason): to set up tray, open packages Grooming: Wash/dry hands;Bed level;Set up   Upper Body Bathing: Minimal assistance;Sitting   Lower Body Bathing: Moderate assistance;Sit to/from stand   Upper Body Dressing : Minimal assistance;Sitting   Lower Body Dressing: Moderate assistance;Sit to/from stand   Toilet Transfer: Minimal assistance;Ambulation;RW   Toileting- Clothing Manipulation and Hygiene: Moderate assistance;Sit to/from stand       Functional mobility during ADLs: Minimal assistance;Rolling walker       Vision Patient Visual Report: No change from baseline Additional Comments: no apparent vision deficits     Perception     Praxis      Pertinent Vitals/Pain Pain Assessment: Faces Faces Pain Scale: No hurt  Hand Dominance Right   Extremity/Trunk Assessment Upper Extremity Assessment Upper Extremity Assessment: Overall WFL for tasks assessed   Lower Extremity Assessment Lower Extremity Assessment: Defer to PT evaluation   Cervical / Trunk Assessment Cervical / Trunk  Assessment: Kyphotic   Communication Communication Communication: No difficulties   Cognition Arousal/Alertness: Awake/alert Behavior During Therapy: Flat affect Overall Cognitive Status: History of cognitive impairments - at baseline                                 General Comments: pt with baseline memory deficits per daughter   General Comments       Exercises     Shoulder Instructions      Home Living Family/patient expects to be discharged to:: Private residence Living Arrangements: Spouse/significant other(59 year old wife has RA and uses a walker) Available Help at Discharge: Family;Available 24 hours/day(wife can offer minimal assistance) Type of Home: House Home Access: Level entry     Home Layout: One level               Home Equipment: Walker - 2 wheels;Bedside commode;Shower seat   Additional Comments: per daughter, pt has bathroom equipment and she does not know whether pt uses is or not      Prior Functioning/Environment Level of Independence: Independent with assistive device(s);Needs assistance  Gait / Transfers Assistance Needed: walks with a walker, + falls ADL's / Homemaking Assistance Needed: wife and children assist with ADL, reports he can bathe and dress himself   Comments: pt smells of urine        OT Problem List: Decreased activity tolerance;Impaired balance (sitting and/or standing);Decreased knowledge of use of DME or AE;Decreased cognition;Decreased strength      OT Treatment/Interventions: Self-care/ADL training;DME and/or AE instruction;Therapeutic activities;Patient/family education;Balance training;Cognitive remediation/compensation    OT Goals(Current goals can be found in the care plan section) Acute Rehab OT Goals Patient Stated Goal: to go home OT Goal Formulation: With patient Time For Goal Achievement: 04/28/18 Potential to Achieve Goals: Good ADL Goals Pt Will Perform Grooming: with  supervision;standing Pt Will Perform Upper Body Bathing: with set-up;with supervision;sitting Pt Will Perform Lower Body Bathing: with set-up;with supervision;sit to/from stand Pt Will Perform Upper Body Dressing: with set-up;with supervision;sitting Pt Will Perform Lower Body Dressing: with set-up;with supervision;sit to/from stand Pt Will Transfer to Toilet: with supervision;ambulating  OT Frequency: Min 2X/week   Barriers to D/C: Decreased caregiver support          Co-evaluation              AM-PAC OT "6 Clicks" Daily Activity     Outcome Measure Help from another person eating meals?: A Little Help from another person taking care of personal grooming?: A Little Help from another person toileting, which includes using toliet, bedpan, or urinal?: A Lot Help from another person bathing (including washing, rinsing, drying)?: A Lot Help from another person to put on and taking off regular upper body clothing?: A Little Help from another person to put on and taking off regular lower body clothing?: A Lot 6 Click Score: 15   End of Session Equipment Utilized During Treatment: Gait belt;Rolling walker Nurse Communication: Mobility status  Activity Tolerance: Patient tolerated treatment well Patient left: in bed;with call bell/phone within reach;with bed alarm set  OT Visit Diagnosis: Unsteadiness on feet (R26.81);Other abnormalities of gait and mobility (R26.89);Muscle weakness (generalized) (M62.81);Other symptoms and signs involving cognitive function  Time: 4758-3074 OT Time Calculation (min): 20 min Charges:  OT General Charges $OT Visit: 1 Visit OT Evaluation $OT Eval Moderate Complexity: 1 Mod  Nestor Lewandowsky, OTR/L Acute Rehabilitation Services Pager: [phone] Office: [phone]  Malka So 04/14/2018, 3:35 PM

## 2018-04-14 NOTE — Evaluation (Signed)
Physical Therapy Evaluation Patient Details Name: Colin [name] MRN: [id number] DOB: [birth year] Today's Date: 04/14/2018   History of Present Illness   Colin [name] is a 85 y.o. 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     Clinical Impression  Pt said he is doing well at home - no family to ask.  Pt reports living with hsi wife - unsure if he will have physical assist at home. Pt reports frequent falls. He says he has all needed equipment. He says he has never had HHPT before. I am hoping pt will have enough family support to get home with family assist and [facility] services.  If not - will need SNF type care due to his history of frequent falls.  Pt should benefit from PT services to increase strength and balance and become safer with his RW.    Follow Up Recommendations Home health PT;SNF;Supervision for mobility/OOB    Equipment Recommendations  None recommended by PT    Recommendations for Other Services       Precautions / Restrictions Precautions Precautions: Fall Precaution Comments: pt reports he falls a lot.  Restrictions Weight Bearing Restrictions: No Other Position/Activity Restrictions: pt has custom shoes - to help his feel with multiple amputations.      Mobility  Bed Mobility Overal bed mobility: Needs Assistance Bed Mobility: Supine to Sit           General bed mobility comments: I had to give pt cues and assist to get seated on side of bed. he was abel to scoot himself to the edge of bed.  Pt unable to put his socks on - got close but couldnt keep himself sitting erect wtih one foot  up  Transfers Overall transfer  level: Needs assistance Equipment used: Rolling walker (2 wheeled) Transfers: Sit to/from Stand Sit to Stand: Min assist;From elevated surface         General transfer comment: pt abel to get up - with several attempts - when he really tried he did well  Ambulation/Gait Ambulation/Gait assistance: Min assist;+2 safety/equipment Gait Distance (Feet): 60 Feet Assistive device: Rolling walker (2 wheeled) Gait Pattern/deviations: Narrow base of support;Step-through pattern;Decreased step length - right;Decreased step length - left;Shuffle;Trunk flexed     General Gait Details: pt leans heaviliy on RW - keeps feet behind his base - cued to stand taller and stay in the RW  Stairs            Wheelchair Mobility    Modified Rankin (Stroke Patients Only)       Balance Overall balance assessment: Needs assistance;History of Falls Sitting-balance support: Bilateral upper extremity supported Sitting balance-Leahy Scale: Good       Standing balance-Leahy Scale: Fair Standing balance comment: standing with RW at all times                             Pertinent Vitals/Pain Pain Assessment: No/denies pain    Home Living Family/patient expects to be discharged to:: Private residence Living Arrangements: Spouse/significant other(I am unsure how much help family can provide)   Type of Home: House Home Access: Level entry     Home Layout: One level Home Equipment: Gilford Rile -  2 wheels;Bedside commode;Shower seat Additional Comments: pt answered most questions - i dont know and im not sure.  Pt reports he has help at home and he does fall a lot    Prior Function           Comments: pt reports he does use the RW at home.  Pt said he nor wife drive - family helps with that     Hand Dominance        Extremity/Trunk Assessment        Lower Extremity Assessment Lower Extremity Assessment: Generalized weakness    Cervical / Trunk Assessment Cervical /  Trunk Assessment: Kyphotic  Communication   Communication: No difficulties(pt not talkative and not very informative)  Cognition Arousal/Alertness: Awake/alert Behavior During Therapy: Flat affect Overall Cognitive Status: Difficult to assess                                 General Comments: no family present - pt not answering many questions.      General Comments      Exercises     Assessment/Plan    PT Assessment Patient needs continued PT services  PT Problem List Decreased balance;Decreased knowledge of precautions;Decreased mobility;Decreased knowledge of use of DME;Decreased activity tolerance;Decreased safety awareness       PT Treatment Interventions DME instruction;Functional mobility training;Balance training;Patient/family education;Therapeutic activities;Therapeutic exercise    PT Goals (Current goals can be found in the Care Plan section)  Acute Rehab PT Goals Patient Stated Goal: I dont know PT Goal Formulation: With patient Time For Goal Achievement: 04/21/18 Potential to Achieve Goals: Fair    Frequency Min 3X/week   Barriers to discharge   I do not know how much assist pt will have at home - not sure how much wife will be able to do to help him    Co-evaluation               AM-PAC PT "6 Clicks" Mobility  Outcome Measure Help needed turning from your back to your side while in a flat bed without using bedrails?: Total Help needed moving from lying on your back to sitting on the side of a flat bed without using bedrails?: Total Help needed moving to and from a bed to a chair (including a wheelchair)?: Total Help needed standing up from a chair using your arms (e.g., wheelchair or bedside chair)?: A Lot Help needed to walk in hospital room?: A Little Help needed climbing 3-5 steps with a railing? : Total 6 Click Score: 9    End of Session Equipment Utilized During Treatment: Gait belt Activity Tolerance: Patient tolerated  treatment well Patient left: in chair;with call bell/phone within reach;with chair alarm set Nurse Communication: Mobility status PT Visit Diagnosis: Unsteadiness on feet (R26.81);Repeated falls (R29.6);Muscle weakness (generalized) (M62.81)    Time: 8325-4982 PT Time Calculation (min) (ACUTE ONLY): 25 min   Charges:   PT Evaluation $PT Eval Low Complexity: 1 Low PT Treatments $Gait Training: 8-22 mins       04/14/2018   Rande Lawman, PT   Loyal Buba 04/14/2018, 10:00 AM

## 2018-04-14 NOTE — Progress Notes (Signed)
Patient admitted after midnight.  See full H&P for details regarding overall plan, except for as noted below.  Family reported patient has dementia at baseline AO x person, but not to time and situation.  He came home with complaints of generalized weakness having multiple falls, urinary incontinence, decreased appetite, and confusion.  History limited by the patient but he reported some abdominal pain that has now resolved.  Exam  Constitutional: Elderly male NAD, calm, comfortable Eyes: PERRL, lids and conjunctivae normal ENMT: Mucous membranes are dry. Posterior pharynx clear of any exudate or lesions.  Edentulous Neck: normal, supple, no masses, no thyromegaly Respiratory: clear to auscultation bilaterally, no wheezing, no crackles. Normal respiratory effort. No accessory muscle use.  Cardiovascular: Regular rate and rhythm, no murmurs / rubs / gallops. No extremity edema. 2+ pedal pulses. No carotid bruits.  Abdomen: no tenderness, no masses palpated. No hepatosplenomegaly. Bowel sounds positive.  Musculoskeletal: no clubbing / cyanosis. No joint deformity upper and lower extremities. Good ROM, no contractures. Normal muscle tone.  Skin: Poor skin turgor Neurologic: CN 2-12 grossly intact. Sensation intact, DTR normal. Strength 4/5 in all 4 extremities Psychiatric: Poor memory.. Alert and oriented x person, but states year is 1973 and does not know why he is here. Normal mood.    Acute renal failure superimposed on chronic kidney disease: On admission creatinine elevated up to 4.4 with BUN 83. Patient's creatinine previously noted to be around 3.25 in January of this year, and prior to that had normally been around 2. Given history of decreased p.o. intake and that the patient is on diuretics suspect likely prerenal in nature compounded with urinary tract infect and or possible obstruction given history of prostate cancer.  Patient had been given 500 mL of normal saline in the ED. -Bladder  scan every shift and report if greater than 350 mL of urine present -Will place Foley, if found to be retaining urine -Check urine creatinine, urea, and sodium -Normal saline IV fluids at 75 mL/h -Hold nephrotoxic agents -May warrant inpatient nephrology consult  Acute metabolic encephalopathy, dementia: Patient has dementia, but more confused than baseline per family.  CT scan of the brain showed no acute signs of bleeding. -Continue to monitor  Elevated troponin: Trending downward.  Patient denies any chest pain at this time.  Troponins 0.12 -> 0.1. In this setting of acute renal failure suspect likely type II NSTEMI.  Cardiology had been initially consulted in the ED due to EKG changes, but thought less likely significant nature.  Generalized weakness, frequent falls: Family had noted that patient had been having more frequent falls prior to arrival. -Check TSH -PT/OT to evaluate -Will follow-up with social work as patient likely needs placement  Chronic systolic congestive heart failure: Patient appears clinically to be hypovolemic. -Strict intake and output -Daily weights -Discontinue Lasix  Diabetes mellitus type 2: Patient's hemoglobin A1c is 6.4. -Hypoglycemic protocols -Continue decrease Lantus dose from 10 to 6 units daily -Sliding scale insulin -May  warrant discontinuing long-acting insulin if patient has hypoglycemic episodes  otherwise follow

## 2018-04-14 NOTE — Plan of Care (Signed)
  Problem: Nutrition: Goal: Adequate nutrition will be maintained Outcome: Progressing   Problem: Safety: Goal: Ability to remain free from injury will improve Outcome: Progressing   

## 2018-04-14 NOTE — Consult Note (Signed)
Brief Cardiology Consult Note  Reason for Consult: Possible STEMI  HPI Colin [name] is a 85 y.o. male with a history of CABG 2004, CKD creatinine 3.2, LVEF 30 to 35%, prior pacemaker status post extraction due to endocarditis, PAD status post femoropopliteal bypass who presents as possible STEMI.  EMS was called to patient's house due to possible blood sugar issue and falls.  Patient fell twice today.  On arrival he did not complain of chest pain but had some possible pain near his navel.  He apparently has dementia and incontinence, and smelled of feces and urine.  An EKG was done and revealed early repolarization in the right precordial leads that was read as possible STEMI.  The Cath Lab was activated.  On arrival to the ED, the patient denied any chest pain.  EKG did not reveal any evidence of STEMI, therefore it was canceled.  Patient appears to be resting comfortably.  Assessment STEMI canceled.  Patient appears to be resting comfortably without any chest pain.  EKG shows sinus rhythm and early repolarization in the right precordial leads and T wave inversions in the lateral precordial leads, which is similar to prior.  Patient does not appear to be having any chest pain.  At this time no further cardiology evaluation is necessary.

## 2018-04-15 DIAGNOSIS — R1310 Dysphagia, unspecified: Secondary | ICD-10-CM | POA: Diagnosis not present

## 2018-04-15 DIAGNOSIS — I5022 Chronic systolic (congestive) heart failure: Secondary | ICD-10-CM | POA: Diagnosis not present

## 2018-04-15 DIAGNOSIS — I639 Cerebral infarction, unspecified: Secondary | ICD-10-CM | POA: Diagnosis not present

## 2018-04-15 DIAGNOSIS — C61 Malignant neoplasm of prostate: Secondary | ICD-10-CM | POA: Diagnosis present

## 2018-04-15 DIAGNOSIS — I130 Hypertensive heart and chronic kidney disease with heart failure and stage 1 through stage 4 chronic kidney disease, or unspecified chronic kidney disease: Secondary | ICD-10-CM | POA: Diagnosis not present

## 2018-04-15 DIAGNOSIS — I252 Old myocardial infarction: Secondary | ICD-10-CM | POA: Diagnosis not present

## 2018-04-15 DIAGNOSIS — W19XXXA Unspecified fall, initial encounter: Secondary | ICD-10-CM | POA: Diagnosis not present

## 2018-04-15 DIAGNOSIS — N390 Urinary tract infection, site not specified: Secondary | ICD-10-CM | POA: Diagnosis not present

## 2018-04-15 DIAGNOSIS — R5381 Other malaise: Secondary | ICD-10-CM | POA: Diagnosis not present

## 2018-04-15 DIAGNOSIS — R4182 Altered mental status, unspecified: Secondary | ICD-10-CM | POA: Diagnosis not present

## 2018-04-15 DIAGNOSIS — N179 Acute kidney failure, unspecified: Secondary | ICD-10-CM | POA: Diagnosis present

## 2018-04-15 DIAGNOSIS — E1151 Type 2 diabetes mellitus with diabetic peripheral angiopathy without gangrene: Secondary | ICD-10-CM | POA: Diagnosis present

## 2018-04-15 DIAGNOSIS — Z7401 Bed confinement status: Secondary | ICD-10-CM | POA: Diagnosis not present

## 2018-04-15 DIAGNOSIS — Z515 Encounter for palliative care: Secondary | ICD-10-CM | POA: Diagnosis present

## 2018-04-15 DIAGNOSIS — I5033 Acute on chronic diastolic (congestive) heart failure: Secondary | ICD-10-CM | POA: Diagnosis not present

## 2018-04-15 DIAGNOSIS — J449 Chronic obstructive pulmonary disease, unspecified: Secondary | ICD-10-CM | POA: Diagnosis not present

## 2018-04-15 DIAGNOSIS — E1129 Type 2 diabetes mellitus with other diabetic kidney complication: Secondary | ICD-10-CM | POA: Diagnosis not present

## 2018-04-15 DIAGNOSIS — N183 Chronic kidney disease, stage 3 (moderate): Secondary | ICD-10-CM

## 2018-04-15 DIAGNOSIS — E872 Acidosis: Secondary | ICD-10-CM | POA: Diagnosis present

## 2018-04-15 DIAGNOSIS — Z87891 Personal history of nicotine dependence: Secondary | ICD-10-CM | POA: Diagnosis not present

## 2018-04-15 DIAGNOSIS — Z951 Presence of aortocoronary bypass graft: Secondary | ICD-10-CM | POA: Diagnosis not present

## 2018-04-15 DIAGNOSIS — R7989 Other specified abnormal findings of blood chemistry: Secondary | ICD-10-CM | POA: Diagnosis not present

## 2018-04-15 DIAGNOSIS — G9341 Metabolic encephalopathy: Secondary | ICD-10-CM | POA: Diagnosis not present

## 2018-04-15 DIAGNOSIS — E785 Hyperlipidemia, unspecified: Secondary | ICD-10-CM | POA: Diagnosis not present

## 2018-04-15 DIAGNOSIS — N170 Acute kidney failure with tubular necrosis: Secondary | ICD-10-CM | POA: Diagnosis not present

## 2018-04-15 DIAGNOSIS — G8929 Other chronic pain: Secondary | ICD-10-CM | POA: Diagnosis not present

## 2018-04-15 DIAGNOSIS — I10 Essential (primary) hypertension: Secondary | ICD-10-CM | POA: Diagnosis not present

## 2018-04-15 DIAGNOSIS — Z7982 Long term (current) use of aspirin: Secondary | ICD-10-CM | POA: Diagnosis not present

## 2018-04-15 DIAGNOSIS — Z79899 Other long term (current) drug therapy: Secondary | ICD-10-CM | POA: Diagnosis not present

## 2018-04-15 DIAGNOSIS — R531 Weakness: Secondary | ICD-10-CM | POA: Diagnosis present

## 2018-04-15 DIAGNOSIS — D509 Iron deficiency anemia, unspecified: Secondary | ICD-10-CM | POA: Diagnosis not present

## 2018-04-15 DIAGNOSIS — R269 Unspecified abnormalities of gait and mobility: Secondary | ICD-10-CM | POA: Diagnosis not present

## 2018-04-15 DIAGNOSIS — I429 Cardiomyopathy, unspecified: Secondary | ICD-10-CM | POA: Diagnosis present

## 2018-04-15 DIAGNOSIS — N184 Chronic kidney disease, stage 4 (severe): Secondary | ICD-10-CM | POA: Diagnosis not present

## 2018-04-15 DIAGNOSIS — M2550 Pain in unspecified joint: Secondary | ICD-10-CM | POA: Diagnosis not present

## 2018-04-15 DIAGNOSIS — Z8673 Personal history of transient ischemic attack (TIA), and cerebral infarction without residual deficits: Secondary | ICD-10-CM | POA: Diagnosis not present

## 2018-04-15 DIAGNOSIS — E1122 Type 2 diabetes mellitus with diabetic chronic kidney disease: Secondary | ICD-10-CM | POA: Diagnosis not present

## 2018-04-15 DIAGNOSIS — Z8249 Family history of ischemic heart disease and other diseases of the circulatory system: Secondary | ICD-10-CM | POA: Diagnosis not present

## 2018-04-15 DIAGNOSIS — N3000 Acute cystitis without hematuria: Secondary | ICD-10-CM | POA: Diagnosis not present

## 2018-04-15 DIAGNOSIS — C775 Secondary and unspecified malignant neoplasm of intrapelvic lymph nodes: Secondary | ICD-10-CM | POA: Diagnosis present

## 2018-04-15 DIAGNOSIS — Z833 Family history of diabetes mellitus: Secondary | ICD-10-CM | POA: Diagnosis not present

## 2018-04-15 DIAGNOSIS — I2510 Atherosclerotic heart disease of native coronary artery without angina pectoris: Secondary | ICD-10-CM | POA: Diagnosis not present

## 2018-04-15 LAB — GLUCOSE, CAPILLARY
Glucose-Capillary: 101 mg/dL — ABNORMAL HIGH (ref 70–99)
Glucose-Capillary: 107 mg/dL — ABNORMAL HIGH (ref 70–99)
Glucose-Capillary: 192 mg/dL — ABNORMAL HIGH (ref 70–99)
Glucose-Capillary: 115 mg/dL — ABNORMAL HIGH (ref 70–99)

## 2018-04-15 MED ORDER — SODIUM CHLORIDE 0.9 % IV SOLN: INTRAVENOUS | Status: AC

## 2018-04-15 NOTE — TOC Initial Note (Signed)
Transition of Care [facility]) - Initial/Assessment Note    Patient Details  Name: Colin [name] MRN: [id number] Date of Birth: 1928-02-07  [facility] CM/SW Contact:    Bartholomew Crews, RN Phone Number: 04/15/2018, 1:18 PM  Clinical Narrative:                 Spoke with spouse on the phone to discuss rehabilitation for [facility] vs SNF. Spouse is only oriented to self. Spouse states SNF would be better. Does not have a specific SNF to consider. Asked CM to call daughter, Luanna Salk, to discuss further. Advised of CSW f/u for ongoing SNF process. Spouse advised that Luanna Salk should be point of contact. Spoke with daughter on the phone to discuss SNF - she is in agreement and has no specific SNF at this time. CSW to f/u. CM to follow for transition of care needs.   Expected Discharge Plan: Skilled Nursing Facility Barriers to Discharge: Insurance Authorization   Patient Goals and CMS Choice        Expected Discharge Plan and Services Expected Discharge Plan: [place]                             HH Arranged: NA [place] Agency: NA  Prior Living Arrangements/Services                       Activities of Daily Living Home Assistive Devices/Equipment: Environmental consultant (specify type) ADL Screening (condition at time of admission) Patient's cognitive ability adequate to safely complete daily activities?: No Is the patient deaf or have difficulty hearing?: No Does the patient have difficulty seeing, even when wearing glasses/contacts?: No Does the patient have difficulty concentrating, remembering, or making decisions?: Yes Patient able to express need for assistance with ADLs?: Yes Does the patient have difficulty dressing or bathing?: Yes Independently performs ADLs?: No Communication: Independent Dressing (OT): Needs assistance Is this a change from baseline?: Pre-admission baseline Grooming: Needs assistance Is this a change from baseline?: Pre-admission  baseline Feeding: Independent, Needs assistance Is this a change from baseline?: Pre-admission baseline Bathing: Needs assistance Is this a change from baseline?: Change from baseline, expected to last <3 days Toileting: Needs assistance Is this a change from baseline?: Change from baseline, expected to last <3 days In/Out Bed: Needs assistance, Independent with device (comment) Is this a change from baseline?: Change from baseline, expected to last <3 days Walks in Home: Independent with device (comment) Does the patient have difficulty walking or climbing stairs?: Yes Weakness of Legs: Both Weakness of Arms/Hands: None  Permission Sought/Granted Permission sought to share information with : Family Supports Permission granted to share information with : Yes, Verbal Permission Granted(spoke with spouse d/t pt not oriented and she asked CM to call daughter)  Share Information with NAME: Ernst Bowler     Permission granted to share info w Relationship: daughter  Permission granted to share info w Contact Information: [phone]  Emotional Assessment       Orientation: : Oriented to Self   Psych Involvement: No (comment)  Admission diagnosis:  Weakness [R53.1] Elevated troponin [R79.89] Acute renal failure, unspecified acute renal failure type ([place]) [N17.9] Altered mental status, unspecified altered mental status type [R41.82] Patient Active Problem List   Diagnosis Date Noted  . Acute renal failure superimposed on stage 3 chronic kidney disease ([place]) 04/15/2018  . Elevated troponin 04/14/2018  . UTI (urinary tract infection) 04/14/2018  .  Acute metabolic encephalopathy 06/34/9494  . Prostate cancer ([place]) 12/11/2016  . Essential hypertension 12/07/2015  . HLD (hyperlipidemia) 12/07/2015  . Sinus bradycardia 11/17/2015  . 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 Anemia, iron deficiency 11/06/2013  . Anemia of chronic  disease 11/06/2013  . Acute blood loss anemia 10/29/2013  . Weakness generalized 10/20/2013  . Bacteremia 10/20/2013  . Type 2 diabetes, uncontrolled, with renal manifestation ([place]) 06/03/2013  . PAD (peripheral artery disease) ([place]) 04/30/2013  . Other stiff joint, of the lower leg 12/31/2012  . Fall 07/01/2012  . Hypoglycemia 07/01/2012  . Hypothermia 07/01/2012  . Failure to thrive in adult 02/27/2012  . Non-compliant patient 02/27/2012  . Microscopic hematuria 01/11/2012  . Syncope 01/09/2012  . CKD (chronic kidney disease), stage IV ([place]) 01/09/2012  . Diabetes mellitus with peripheral artery disease ([place])   . Pain in limb 12/29/2011  . Chronic systolic CHF (congestive heart failure) ([place])   . Acute kidney injury ([place])   . History of CVA (cerebrovascular accident)   . Ischemic cardiomyopathy   . COPD (chronic obstructive pulmonary disease) ([place])   . History of anemia   . Coronary artery disease involving native heart without angina pectoris 11/30/2009  . Cardiac pacemaker in situ 11/30/2009   PCP:  Deland Pretty, MD Pharmacy:   [id number] [place], [place] [address] Phone: [phone] Fax: [phone]  [place], [facility] [address] [place] [address] [place] [address] Phone: [phone] Fax: [phone]     Social Determinants of Health (SDOH) Interventions    Readmission Risk Interventions No flowsheet data found.

## 2018-04-15 NOTE — Progress Notes (Signed)
Physical Therapy Treatment Patient Details Name: Colin [name] MRN: [id number] DOB: 1928/02/26 Today's Date: 04/15/2018    History of Present Illness  Colin [name] is a 85 y.o. 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    PT Comments    Patient requiring motivation to participate with PT this session. Requires Min A for bed level mobility and functional transfers - unable to progress gait this session per patient request. Patient with unsteadiness with transfers/mobility requiring use of RW at all times. PT to continue to follow acutely.     Follow Up Recommendations  Home health PT;SNF;Supervision for mobility/OOB     Equipment Recommendations  None recommended by PT    Recommendations for Other Services       Precautions / Restrictions Precautions Precautions: Fall Precaution Comments: daughter reports pt has had 3 falls in the last 2 weeks Restrictions Weight Bearing Restrictions: No Other Position/Activity Restrictions: pt has custom shoes - to help his feel with multiple amputations.    Mobility  Bed Mobility Overal bed mobility: Needs Assistance Bed Mobility: Rolling;Sidelying to Sit Rolling: Min assist;Min guard Sidelying to sit: Min assist       General bed mobility comments: up to Min A for bed mobility for trunk control; cueing to complete task successfully  Transfers Overall transfer level: Needs assistance Equipment used: Rolling walker (2 wheeled) Transfers: Sit to/from [facility] Sit to Stand: Min assist;From elevated surface Stand pivot transfers: Min guard       General transfer comment:  light Min A to stand at bedside; cueing for posture and forward gaze  Ambulation/Gait             General Gait Details: patient only wishing to transfer at this time - had been busy with bathing and now wishing to eat lunch   Stairs             Wheelchair Mobility    Modified Rankin (Stroke Patients Only)       Balance Overall balance assessment: Needs assistance;History of Falls Sitting-balance support: Feet unsupported;Single extremity supported Sitting balance-Leahy Scale: Fair Sitting balance - Comments: unable to don socks - requires PT assist   Standing balance support: Bilateral upper extremity supported;During functional activity Standing balance-Leahy Scale: Poor Standing balance comment: UE support at RW                            Cognition Arousal/Alertness: Awake/alert Behavior During Therapy: WFL for tasks assessed/performed Overall Cognitive Status: History of cognitive impairments - at baseline                                 General Comments: pt with baseline memory deficits per daughter      Exercises      General Comments        Pertinent Vitals/Pain Pain Assessment: No/denies pain    Home Living                      Prior Function            PT Goals (current goals can now be found in  the care plan section) Acute Rehab PT Goals Patient Stated Goal: to go home PT Goal Formulation: With patient Time For Goal Achievement: 04/21/18 Potential to Achieve Goals: Fair Progress towards PT goals: Progressing toward goals    Frequency    Min 3X/week      PT Plan Current plan remains appropriate    Co-evaluation              AM-PAC PT "6 Clicks" Mobility   Outcome Measure  Help needed turning from your back to your side while in a flat bed without using bedrails?: A Little Help needed moving from lying on your back to sitting on the side of a flat bed without using bedrails?: A  Little Help needed moving to and from a bed to a chair (including a wheelchair)?: A Little Help needed standing up from a chair using your arms (e.g., wheelchair or bedside chair)?: A Little Help needed to walk in hospital room?: A Lot Help needed climbing 3-5 steps with a railing? : Total 6 Click Score: 15    End of Session Equipment Utilized During Treatment: Gait belt Activity Tolerance: Patient tolerated treatment well Patient left: in chair;with call bell/phone within reach;with chair alarm set Nurse Communication: Mobility status PT Visit Diagnosis: Unsteadiness on feet (R26.81);Repeated falls (R29.6);Muscle weakness (generalized) (M62.81)     Time: 3299-2426 PT Time Calculation (min) (ACUTE ONLY): 20 min  Charges:  $Therapeutic Activity: 8-22 mins                     Lanney Gins, PT, DPT Supplemental Physical Therapist 04/15/18 2:54 PM Pager: [phone] Office: [phone]

## 2018-04-15 NOTE — Care Management Obs Status (Signed)
[place] NOTIFICATION   Patient Details  Name: Colin [name] MRN: [id number] Date of Birth: [birth year]   Medicare Observation Status Notification Given:  Yes    Bartholomew Crews, RN 04/15/2018, 1:10 PM

## 2018-04-15 NOTE — Progress Notes (Signed)
[facility] PROGRESS NOTE  Colin [name] Colin [name]:[id number] DOB: Dec 16, 1928 DOA: 04/14/2018 PCP: Deland Pretty, MD  Assessment/Plan: Acute renal failure superimposed on chronic kidney disease: creatinine trending down slightly. Renal US with chronic renal disease.  Patient's creatinine previously noted to be around 3.25 in January of this year, and prior to that had normally been around 2.  Patient had been given 500 mL of normal saline in the ED but no continuous. Urine creatinine 148, urine sodium 26. -will provide gentle IV fluids -monitor intake and output. -Normal saline IV fluids at 75 mL/h -Hold nephrotoxic agents  Acute metabolic encephalopathy, dementia: closer to baseline this am.  CT scan of the brain showed no acute signs of bleeding. -Continue to monitor  Elevated troponin: Trended downward from 12-0.09.  Patient continues to deny any chest pain. Evaluated by Cardiology who opined elevated troponin related to underlying UTI and acute on chronic renal insufficiency. Recommended medical management and treatment of infection and signed off.  -will monitor  Generalized weakness, frequent falls: Family had noted that patient had been having more frequent falls prior to arrival. TSH 3.2. PT evaluated and recommended snf or HHPT.  -will order HH PT -social work  Chronic systolic congestive heart failure: Compensated. EF in 2015 35% -Strict intake and output -Daily weights -hold Lasix for now  Diabetes mellitus type 2: controlled. Patient's hemoglobin A1c is 6.4. cbg this am 107 -Continue decrease Lantus dose from 10 to 6 units daily -Sliding scale insulin -May  warrant discontinuing long-acting insulin if patient has hypoglycemic episodes  Code Status: full Family Communication: none present Disposition Plan: home hopefully in am   Consultants:    Procedures:    Antibiotics:  Rocephin 04/14/18>>  HPI/Subjective: [name] is 85 yo male with PMH of CAD  s/p CABG 5621, chronic systolic CHF, ICM, SSS s/p pacemaker 04/2005 by Dr. Glade Lloyd with device leads extraction in 2015 due to vegetation/endocarditis, carotid artery disease, PAD s/p L fem to above knee popliteal bypass 4/15 and L toe amputation, HTN, HLD, DM, CKD stage IV, dementia, metastatic prostate CA, anemia and CVA admitted 04/14/18 acute on chronic renal failure and UTI.   Objective: Vitals:   04/15/18 0420 04/15/18 0817  BP: 137/60 140/75  Pulse: 66 73  Resp: 18 18  Temp: 98.9 F (37.2 C) 98.1 F (36.7 C)  SpO2: 97% 100%    Intake/Output Summary (Last 24 hours) at 04/15/2018 1134 Last data filed at 04/14/2018 2030 Gross per 24 hour  Intake 841.26 ml  Output 200 ml  Net 641.26 ml   Filed Weights   04/14/18 0042 04/15/18 0420  Weight: 70.7 kg 64.7 kg    Exam:   General:  Lying in bed with covers over his head. Pulls covers down when named called. No acute distress  Cardiovascular: rrr no mgr no LE edema  Respiratory: normal effort BS clear bilaterally no wheeze  Abdomen: non-distended +BS no guarding or rebounding  Musculoskeletal: joints without swelling/erythema   Data Reviewed: Basic Metabolic Panel: Recent Labs  Lab 04/14/18 0101  NA 137  K 5.0  CL 109  CO2 20*  GLUCOSE 142*  BUN 105*  CREATININE 4.18*  CALCIUM 9.4   Liver Function Tests: Recent Labs  Lab 04/14/18 0101  AST 30  ALT 24  ALKPHOS 71  BILITOT 0.5  PROT 7.2  ALBUMIN 3.1*   No results for input(s): LIPASE, AMYLASE in the last 168 hours. No results for input(s): AMMONIA in the last 168 hours. CBC: Recent  Labs  Lab 04/14/18 0101  WBC 5.4  NEUTROABS 3.4  HGB 9.2*  HCT 31.3*  MCV 82.8  PLT 215   Cardiac Enzymes: Recent Labs  Lab 04/14/18 0101 04/14/18 0505 04/14/18 1106 04/14/18 1637  TROPONINI 0.12* 0.10* 0.10* 0.09*   BNP (last 3 results) No results for input(s): BNP in the last 8760 hours.  ProBNP (last 3 results) No results for input(s): PROBNP in the last  8760 hours.  CBG: Recent Labs  Lab 04/14/18 0607 04/14/18 1153 04/14/18 1634 04/14/18 2057 04/15/18 0606  GLUCAP 112* 112* 135* 151* 101*    Recent Results (from the past 240 hour(s))  Culture, blood (Routine X 2) w Reflex to ID Panel     Status: None (Preliminary result)   Collection Time: 04/14/18  5:10 AM  Result Value Ref Range Status   Specimen Description BLOOD LEFT WRIST  Final   Special Requests   Final    BOTTLES DRAWN AEROBIC AND ANAEROBIC Blood Culture results may not be optimal due to an inadequate volume of blood received in culture bottles   Culture   Final    NO GROWTH 1 DAY Performed at [facility], [place] [address] [place], [place] [address]    Report Status PENDING  Incomplete  Culture, blood (Routine X 2) w Reflex to ID Panel     Status: None (Preliminary result)   Collection Time: 04/14/18  5:21 AM  Result Value Ref Range Status   Specimen Description BLOOD RIGHT HAND  Final   Special Requests   Final    BOTTLES DRAWN AEROBIC AND ANAEROBIC Blood Culture results may not be optimal due to an inadequate volume of blood received in culture bottles   Culture   Final    NO GROWTH 1 DAY Performed at [facility], [place] [address], [place], [place] [address]    Report Status PENDING  Incomplete     Studies: Dg Chest 2 View  Result Date: 04/14/2018 CLINICAL DATA:  Altered mental status. EXAM: CHEST - 2 VIEW COMPARISON:  09/17/2015 FINDINGS: Postsurgical changes from CABG. Cardiomediastinal silhouette is mildly enlarged. Mediastinal contours appear intact. Eventration of bilateral hemidiaphragms. There is no evidence of focal airspace consolidation, pleural effusion or pneumothorax. Bilateral lower lobe atelectasis. Osseous structures are without acute abnormality. Soft tissues are grossly normal. IMPRESSION: 1. Bilateral lower lobe atelectasis. 2. Mildly enlarged cardiac silhouette. Electronically Signed   By: Fidela Salisbury M.D.   On: 04/14/2018  01:31   Ct Head Wo Contrast  Result Date: 04/14/2018 CLINICAL DATA:  Altered level of consciousness, history prostate cancer, cardiomyopathy, dementia, coronary artery disease post MI, CHF, diabetes mellitus, COPD, hypertension, stroke EXAM: CT HEAD WITHOUT CONTRAST TECHNIQUE: Contiguous axial images were obtained from the base of the skull through the vertex without intravenous contrast. Sagittal and coronal MPR images reconstructed from axial data set. COMPARISON:  02/20/2015 FINDINGS: Brain: Generalized atrophy. Normal ventricular morphology. No midline shift or mass effect. Small vessel chronic ischemic changes of deep cerebral white matter. Small old LEFT frontal infarct. No intracranial hemorrhage, mass lesion, evidence of acute infarction, or extra-axial fluid collection. Vascular: Atherosclerotic calcifications of internal carotid and vertebral arteries at skull base Skull: Calvaria intact. Mixed lytic and sclerotic process identified within the sphenoid from central to LEFT obliterating the majority of the sphenoid sinus, unchanged from prior exams, likely a benign process such as Paget's disease. Sinuses/Orbits: Paranasal sinuses and mastoid air cells clear Other: N/A IMPRESSION: Atrophy with small vessel chronic ischemic  changes of deep cerebral white matter. Small old LEFT frontal infarct. No acute intracranial abnormalities. 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   On: 04/14/2018 02:29   US Renal  Result Date: 04/14/2018 CLINICAL DATA:  Acute renal failure EXAM: RENAL / URINARY TRACT ULTRASOUND COMPLETE COMPARISON:  None. FINDINGS: Right Kidney: Renal measurements: 9.2 x 5.6 x 5.3 cm = volume: 145 mL. RIGHT renal cortex is markedly echogenic throughout. No mass or hydronephrosis seen. Left Kidney: Renal measurements: 9.8 x 5.8 x 5 cm = volume: 147 mL. LEFT renal cortex is also markedly echogenic throughout. No mass or hydronephrosis seen Bladder:  Appears normal for degree of bladder distention. IMPRESSION: 1. Both kidneys are markedly echogenic suggesting chronic medical renal disease. 2. No acute findings.  No hydronephrosis. Electronically Signed   By: Franki Cabot M.D.   On: 04/14/2018 11:40    Scheduled Meds: . amLODipine  2.5 mg Oral Daily  . aspirin EC  81 mg Oral Daily  . ezetimibe  10 mg Oral Daily  . ferrous sulfate  325 mg Oral Q breakfast  . heparin  5,000 Units Subcutaneous Q8H  . hydrALAZINE  10 mg Oral Q8H  . insulin aspart  0-9 Units Subcutaneous TID WC  . insulin glargine  6 Units Subcutaneous QHS  . isosorbide mononitrate  30 mg Oral Daily  . omega-3 acid ethyl esters  1 g Oral Daily  . vitamin B-12  1,000 mcg Oral Daily   Continuous Infusions: . sodium chloride Stopped (04/14/18 1644)  . sodium chloride 50 mL/hr at 04/15/18 0853  . cefTRIAXone (ROCEPHIN)  IV 1 g (04/15/18 0656)    Principal Problem:   Elevated troponin Active Problems:   UTI (urinary tract infection)   Acute metabolic encephalopathy   Acute renal failure superimposed on stage 3 chronic kidney disease (HCC)   Coronary artery disease involving native heart without angina pectoris   Chronic systolic CHF (congestive heart failure) (HCC)   History of CVA (cerebrovascular accident)   Fall   Type 2 diabetes, uncontrolled, with renal manifestation (HCC)   Essential hypertension   Prostate cancer (HCC)   Anemia, iron deficiency   HLD (hyperlipidemia)   CKD (chronic kidney disease), stage IV ([place])    Time spent: 93 minutes   [facility] M NP  [facility]  If 7PM-7AM, please contact night-coverage at [email or web address], password [facility] 04/15/2018, 11:34 AM  LOS: 0 days

## 2018-04-15 NOTE — NC FL2 (Signed)
[place] LEVEL OF CARE SCREENING TOOL     IDENTIFICATION  Patient Name: Colin [name] Birthdate: Mar 27, 1928 Sex: male Admission Date (Current Location): 04/14/2018  [facility] and [place] Number:  Herbalist and Address:  [facility], [place] [address] [place], [place] [address]      Provider Number: [id number]  Attending Physician Name and Address:  Geradine Girt, DO  Relative Name and Phone Number:       Current Level of Care: Hospital Recommended Level of Care: [place] Prior Approval Number:    Date Approved/Denied:   PASRR Number: [id number]  Discharge Plan: SNF    Current Diagnoses: Patient Active Problem List   Diagnosis Date Noted  . Acute renal failure superimposed on stage 3 chronic kidney disease ([place]) 04/15/2018  . Elevated troponin 04/14/2018  . UTI (urinary tract infection) 04/14/2018  . Acute metabolic encephalopathy 83/38/2505  . Prostate cancer ([place]) 12/11/2016  . Essential hypertension 12/07/2015  . HLD (hyperlipidemia) 12/07/2015  . Sinus bradycardia 11/17/2015  . 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 Anemia, iron deficiency 11/06/2013  . Anemia of chronic disease 11/06/2013  . Acute blood loss anemia 10/29/2013  . Weakness generalized 10/20/2013  . Bacteremia 10/20/2013  . Type 2 diabetes, uncontrolled, with renal manifestation ([place]) 06/03/2013  . PAD (peripheral artery disease) ([place]) 04/30/2013  . Other stiff joint, of the lower leg 12/31/2012  . Fall 07/01/2012  . Hypoglycemia 07/01/2012  . Hypothermia 07/01/2012  . Failure to thrive in adult 02/27/2012  . Non-compliant patient 02/27/2012  . Microscopic hematuria 01/11/2012  . Syncope 01/09/2012  . CKD (chronic kidney disease), stage IV ([place]) 01/09/2012  . Diabetes mellitus with peripheral artery disease ([place])   . Pain in limb 12/29/2011  . Chronic systolic CHF  (congestive heart failure) ([place])   . Acute kidney injury ([place])   . History of CVA (cerebrovascular accident)   . Ischemic cardiomyopathy   . COPD (chronic obstructive pulmonary disease) ([place])   . History of anemia   . Coronary artery disease involving native heart without angina pectoris 11/30/2009  . Cardiac pacemaker in situ 11/30/2009    Orientation RESPIRATION BLADDER Height & Weight     Self  Normal Incontinent Weight: 142 lb 11.2 oz (64.7 kg) Height:  5\' 9"  (175.3 cm)  BEHAVIORAL SYMPTOMS/MOOD NEUROLOGICAL BOWEL NUTRITION STATUS  (None) (History of CVA) Continent Diet(Carb modified)  AMBULATORY STATUS COMMUNICATION OF NEEDS Skin   Limited Assist Verbally Other (Comment)(Amputation)                       Personal Care Assistance Level of Assistance  Bathing, Feeding, Dressing Bathing Assistance: Limited assistance Feeding assistance: Limited assistance Dressing Assistance: Limited assistance     Functional Limitations Info  Sight, Hearing, Speech Sight Info: Adequate Hearing Info: Adequate Speech Info: Adequate    SPECIAL CARE FACTORS FREQUENCY  PT (By licensed PT), Blood pressure, OT (By licensed OT)     PT Frequency: 5 x week OT Frequency: 5 x week            Contractures Contractures Info: Not present    Additional Factors Info  Code Status, Allergies Code Status Info: Full code Allergies Info: Lisinopril.           Current Medications (04/15/2018):  This is the current hospital active medication list Current Facility-Administered Medications  Medication Dose Route Frequency Provider Last  Rate Last Dose  . 0.9 %  sodium chloride infusion   Intravenous PRN Norval Morton, MD   Stopped at 04/14/18 1644  . acetaminophen (TYLENOL) tablet 650 mg  650 mg Oral Q4H PRN Ivor Costa, MD      . albuterol (PROVENTIL) (2.5 MG/3ML) 0.083% nebulizer solution 2.5 mg  2.5 mg Nebulization Q4H PRN Ivor Costa, MD      . amLODipine (NORVASC) tablet 2.5 mg  2.5 mg  Oral Daily Ivor Costa, MD   2.5 mg at 04/15/18 0855  . aspirin EC tablet 81 mg  81 mg Oral Daily Ivor Costa, MD   81 mg at 04/15/18 0855  . cefTRIAXone (ROCEPHIN) 1 g in sodium chloride 0.9 % 100 mL IVPB  1 g Intravenous Q24H Ivor Costa, MD 200 mL/hr at 04/15/18 0656 1 g at 04/15/18 0656  . ezetimibe (ZETIA) tablet 10 mg  10 mg Oral Daily Ivor Costa, MD   10 mg at 04/15/18 0855  . ferrous sulfate tablet 325 mg  325 mg Oral Q breakfast Ivor Costa, MD   325 mg at 04/15/18 9169  . heparin injection 5,000 Units  5,000 Units Subcutaneous Cleophas Dunker, MD   5,000 Units at 04/15/18 1335  . hydrALAZINE (APRESOLINE) injection 5 mg  5 mg Intravenous Q2H PRN Ivor Costa, MD      . hydrALAZINE (APRESOLINE) tablet 10 mg  10 mg Oral Q8H Ivor Costa, MD   10 mg at 04/15/18 1334  . insulin aspart (novoLOG) injection 0-9 Units  0-9 Units Subcutaneous TID WC Ivor Costa, MD      . insulin glargine (LANTUS) injection 6 Units  6 Units Subcutaneous QHS Ivor Costa, MD   6 Units at 04/14/18 2148  . isosorbide mononitrate (IMDUR) 24 hr tablet 30 mg  30 mg Oral Daily Ivor Costa, MD   30 mg at 04/15/18 0855  . omega-3 acid ethyl esters (LOVAZA) capsule 1 g  1 g Oral Daily Ivor Costa, MD   1 g at 04/14/18 1100  . ondansetron (ZOFRAN) injection 4 mg  4 mg Intravenous Q6H PRN Ivor Costa, MD      . vitamin B-12 (CYANOCOBALAMIN) tablet 1,000 mcg  1,000 mcg Oral Daily Ivor Costa, MD   1,000 mcg at 04/15/18 4503     Discharge Medications: Please see discharge summary for a list of discharge medications.  Relevant Imaging Results:  Relevant Lab Results:   Additional Information SS#: [id number]  Candie Chroman, LCSW

## 2018-04-15 NOTE — Progress Notes (Signed)
patint refused to eat breakfast and lunch, says he's not hungry. Patient is now up in chair, RN give him a glucerna shake for snake and he drank all of it.

## 2018-04-16 DIAGNOSIS — E872 Acidosis, unspecified: Secondary | ICD-10-CM | POA: Diagnosis present

## 2018-04-16 LAB — CBC
HCT: 25.9 % — ABNORMAL LOW (ref 39.0–52.0)
Hemoglobin: 7.6 g/dL — ABNORMAL LOW (ref 13.0–17.0)
MCH: 23.5 pg — AB (ref 26.0–34.0)
MCHC: 29.3 g/dL — ABNORMAL LOW (ref 30.0–36.0)
MCV: 79.9 fL — ABNORMAL LOW (ref 80.0–100.0)
Platelets: 190 10*3/uL (ref 150–400)
RBC: 3.24 MIL/uL — ABNORMAL LOW (ref 4.22–5.81)
RDW: 16 % — ABNORMAL HIGH (ref 11.5–15.5)
WBC: 6.5 10*3/uL (ref 4.0–10.5)
nRBC: 0 % (ref 0.0–0.2)

## 2018-04-16 LAB — BASIC METABOLIC PANEL
Anion gap: 10 (ref 5–15)
BUN: 107 mg/dL — ABNORMAL HIGH (ref 8–23)
CO2: 16 mmol/L — ABNORMAL LOW (ref 22–32)
Calcium: 9.2 mg/dL (ref 8.9–10.3)
Chloride: 109 mmol/L (ref 98–111)
Creatinine, Ser: 4.51 mg/dL — ABNORMAL HIGH (ref 0.61–1.24)
GFR calc Af Amer: 13 mL/min — ABNORMAL LOW (ref >60)
GFR calc non Af Amer: 11 mL/min — ABNORMAL LOW (ref >60)
GLUCOSE: 137 mg/dL — AB (ref 70–99)
Potassium: 4.6 mmol/L (ref 3.5–5.1)
Sodium: 135 mmol/L (ref 135–145)

## 2018-04-16 LAB — GLUCOSE, CAPILLARY
Glucose-Capillary: 133 mg/dL — ABNORMAL HIGH (ref 70–99)
Glucose-Capillary: 115 mg/dL — ABNORMAL HIGH (ref 70–99)
Glucose-Capillary: 172 mg/dL — ABNORMAL HIGH (ref 70–99)
Glucose-Capillary: 95 mg/dL (ref 70–99)

## 2018-04-16 NOTE — Progress Notes (Signed)
Physical Therapy Treatment Patient Details Name: Colin [name] MRN: [id number] DOB: 15-Jun-1928 Today's Date: 04/16/2018    History of Present Illness  Colin [name] is a 85 y.o. 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    PT Comments    Patient seen for mobility progression. PT donning B shoes for patient to aid with walking. Ambulating up to 60 feet with RW with Min A for safety and stability. Per chart review, daughters agreeable to SNF at discharge. PT to continue to follow acutely.     Follow Up Recommendations  SNF;Supervision for mobility/OOB     Equipment Recommendations  None recommended by PT    Recommendations for Other Services       Precautions / Restrictions Precautions Precautions: Fall Precaution Comments: daughter reports pt has had 3 falls in the last 2 weeks Restrictions Weight Bearing Restrictions: No    Mobility  Bed Mobility                  Transfers Overall transfer level: Needs assistance Equipment used: Rolling walker (2 wheeled) Transfers: Sit to/from [facility] Sit to Stand: Min assist Stand pivot transfers: Min assist       General transfer comment: Min A to stand from recliner and for pivot transfers with B shooes donned (both have inserts that aid with walking due to B amputation  Ambulation/Gait Ambulation/Gait assistance: Min assist Gait Distance (Feet): 60 Feet Assistive device: Rolling walker (2 wheeled) Gait Pattern/deviations: Step-to pattern;Step-through pattern;Narrow base of support;Drifts right/left;Trunk flexed Gait velocity: decreased   General Gait  Details: requires use of RW for stability; narrow BOS throughout even with cueing to increase BOS   Stairs             Wheelchair Mobility    Modified Rankin (Stroke Patients Only)       Balance Overall balance assessment: Needs assistance Sitting-balance support: Feet supported;No upper extremity supported Sitting balance-Leahy Scale: Fair     Standing balance support: Bilateral upper extremity supported;During functional activity Standing balance-Leahy Scale: Poor                              Cognition Arousal/Alertness: Awake/alert Behavior During Therapy: Flat affect Overall Cognitive Status: History of cognitive impairments - at baseline                                 General Comments: pt with baseline memory deficits per daughter      Exercises      General Comments        Pertinent Vitals/Pain Pain Assessment: No/denies pain    Home Living                      Prior Function            PT Goals (current goals can now be found in the care plan section) Acute Rehab PT Goals Patient Stated Goal: to see my wife PT Goal Formulation: With patient Time For Goal Achievement: 04/21/18 Potential to Achieve Goals: Fair Progress towards PT goals: Progressing toward goals    Frequency  Min 3X/week      PT Plan Current plan remains appropriate    Co-evaluation              AM-PAC PT "6 Clicks" Mobility   Outcome Measure  Help needed turning from your back to your side while in a flat bed without using bedrails?: A Little Help needed moving from lying on your back to sitting on the side of a flat bed without using bedrails?: A Little Help needed moving to and from a bed to a chair (including a wheelchair)?: A Little Help needed standing up from a chair using your arms (e.g., wheelchair or bedside chair)?: A Little Help needed to walk in hospital room?: A Little Help needed climbing 3-5 steps with a  railing? : Total 6 Click Score: 16    End of Session Equipment Utilized During Treatment: Gait belt Activity Tolerance: Patient tolerated treatment well Patient left: in chair;with call bell/phone within reach;with chair alarm set Nurse Communication: Mobility status PT Visit Diagnosis: Unsteadiness on feet (R26.81);Repeated falls (R29.6);Muscle weakness (generalized) (M62.81)     Time: 7893-8101 PT Time Calculation (min) (ACUTE ONLY): 19 min  Charges:  $Gait Training: 8-22 mins                     Lanney Gins, PT, DPT Supplemental Physical Therapist 04/16/18 3:20 PM Pager: [phone] Office: [phone]

## 2018-04-16 NOTE — Plan of Care (Signed)
  Problem: Education: Goal: Knowledge of General Education information will improve Description Including pain rating scale, medication(s)/side effects and non-pharmacologic comfort measures Outcome: Progressing   Problem: Clinical Measurements: Goal: Ability to maintain clinical measurements within normal limits will improve Outcome: Progressing   Problem: Clinical Measurements: Goal: Diagnostic test results will improve Outcome: Progressing   Problem: Activity: Goal: Risk for activity intolerance will decrease Outcome: Progressing   Problem: Pain Managment: Goal: General experience of comfort will improve Outcome: Progressing

## 2018-04-16 NOTE — Progress Notes (Addendum)
[facility] PROGRESS NOTE  Colin [name] TGG:[id number] DOB: [birth year] DOA: 04/14/2018 PCP: Deland Pretty, MD  Assessment/Plan:  Acute renal failure superimposed on chronic kidney disease: creatinine trending up this am. Patient refusing most po intake yesterday. Did have gentle IV fluids.  Renal US with chronic renal disease. Urine creatinine 148, urine sodium 26. Urine output unclear due to condom cath and incontinence -will obtain bladder scan to check for retention-- < 163mls -monitor intake and output. -Hold nephrotoxic agents -renal U/S: Both kidneys are markedly echogenic suggesting chronic medical renal disease.  UTI: max temp 101.2. rocephin started. Blood cultures no growth so far. Urine culture never sent   -continue rocephin  Metabolic acidosis. Likely related to #1. co2 trending down ward and po intake poor yesterday. Has already demonstrated better appetite today.  -will recheck in am -encourage po intake  Acute metabolic encephalopathy, dementia: at baseline this am. CT scan of the brain showed no acute signs of bleeding. -Continue to monitor  Elevated troponin: Trended downward  Patient continues to deny any chest pain. Evaluated byCardiology who opined elevated troponin related to underlying UTI and acute on chronic renal insufficiency. Recommended medical management and treatment of infection and signed off.   Generalized weakness, frequent falls: likely related to above.  Family had noted that patient had been having [name] frequent falls prior to arrival. TSH 3.2. PT evaluated and recommended snf or HHPT. Family wants placement.  -social work in process of snf placement  Chronic systolic congestive heart failure: Compensated. EF in 2015 35%. Weight trended up a little today likely related to fluids yesterday.  -Strict intake and output -Daily weights -holding Lasix for now, consider resuming tomorrow   Diabetes mellitus type 2: controlled. Patient's  hemoglobin A1c is 6.4. cbg this am 133 -Continue decrease Lantus dose from 10 to 6 units daily -Sliding scale insulin -May warrant discontinuing long-acting insulin if patient has hypoglycemic episodes   Code Status: full Family Communication: spoke with daughter on phone Disposition Plan: snf placement via SW and daughter   Consultants:  none  Procedures:    Antibiotics:  Rocephin 04/14/18>>  HPI/Subjective: Colin [name] 85 yo male with PMH of CAD s/p CABG 7035, chronic systolic CHF, ICM, SSS s/p pacemaker 04/2005 by Dr. Glade Lloyd with device leads extraction in 2015 due to vegetation/endocarditis, carotid artery disease, PAD s/p L fem to above knee popliteal bypass 4/15 and L toe amputation, HTN, HLD, DM, CKD stage IV, dementia, metastatic prostate CA, anemia and CVA admitted 04/14/18 acute on chronic renal failure and UTI.   [name] alert and interactive today. Has finished breakfast this am and is smiling during exam. Denies pain/discomfort  Objective: Vitals:   04/16/18 0331 04/16/18 0824  BP: 117/61 132/73  Pulse: 78 74  Resp: 15   Temp: 99.5 F (37.5 C) 99.1 F (37.3 C)  SpO2: 97% 99%    Intake/Output Summary (Last 24 hours) at 04/16/2018 0093 Last data filed at 04/16/2018 0745 Gross per 24 hour  Intake 1380 ml  Output 825 ml  Net 555 ml   Filed Weights   04/14/18 0042 04/15/18 0420 04/16/18 0429  Weight: 70.7 kg 64.7 kg 65.9 kg    Exam:   General:  Awake alert in no acute distress  Cardiovascular: rrr no mgr no LE edema  Respiratory: normal effort BS clear bilaterally  Abdomen: non-distended non-tender +BS   Musculoskeletal: joints without swelling/erythema   Data Reviewed: Basic Metabolic Panel: Recent Labs  Lab 04/14/18 0101 04/16/18 0420  NA 137  135  K 5.0 4.6  CL 109 109  CO2 20* 16*  GLUCOSE 142* 137*  BUN 105* 107*  CREATININE 4.18* 4.51*  CALCIUM 9.4 9.2   Liver Function Tests: Recent Labs  Lab 2018-04-25 0101  AST 30  ALT  24  ALKPHOS 71  BILITOT 0.5  PROT 7.2  ALBUMIN 3.1*   No results for input(s): LIPASE, AMYLASE in the last 168 hours. No results for input(s): AMMONIA in the last 168 hours. CBC: Recent Labs  Lab 2018-04-25 0101 04/16/18 0420  WBC 5.4 6.5  NEUTROABS 3.4  --   HGB 9.2* 7.6*  HCT 31.3* 25.9*  MCV 82.8 79.9*  PLT 215 190   Cardiac Enzymes: Recent Labs  Lab 2018/04/25 0101 25-Apr-2018 0505 Apr 25, 2018 1106 04-25-2018 1637  TROPONINI 0.12* 0.10* 0.10* 0.09*   BNP (last 3 results) No results for input(s): BNP in the last 8760 hours.  ProBNP (last 3 results) No results for input(s): PROBNP in the last 8760 hours.  CBG: Recent Labs  Lab 04/15/18 0606 04/15/18 1136 04/15/18 1652 04/15/18 24-Apr-2124 04/16/18 0617  GLUCAP 101* 107* 192* 115* 133*    Recent Results (from the past 240 hour(s))  Culture, blood (Routine X 2) w Reflex to ID Panel     Status: None (Preliminary result)   Collection Time: 25-Apr-2018  5:10 AM  Result Value Ref Range Status   Specimen Description BLOOD LEFT WRIST  Final   Special Requests   Final    BOTTLES DRAWN AEROBIC AND ANAEROBIC Blood Culture results may not be optimal due to an inadequate volume of blood received in culture bottles   Culture   Final    NO GROWTH 1 DAY Performed at [facility], [place] [address], [place], [place] [address]    Report Status PENDING  Incomplete  Culture, blood (Routine X 2) w Reflex to ID Panel     Status: None (Preliminary result)   Collection Time: 25-Apr-2018  5:21 AM  Result Value Ref Range Status   Specimen Description BLOOD RIGHT HAND  Final   Special Requests   Final    BOTTLES DRAWN AEROBIC AND ANAEROBIC Blood Culture results may not be optimal due to an inadequate volume of blood received in culture bottles   Culture   Final    NO GROWTH 1 DAY Performed at [facility], [place] [address], [place], [place] [address]    Report Status PENDING  Incomplete     Studies: US Renal  Result Date:  04/25/2018 CLINICAL DATA:  Acute renal failure EXAM: RENAL / URINARY TRACT ULTRASOUND COMPLETE COMPARISON:  None. FINDINGS: Right Kidney: Renal measurements: 9.2 x 5.6 x 5.3 cm = volume: 145 mL. RIGHT renal cortex is markedly echogenic throughout. No mass or hydronephrosis seen. Left Kidney: Renal measurements: 9.8 x 5.8 x 5 cm = volume: 147 mL. LEFT renal cortex is also markedly echogenic throughout. No mass or hydronephrosis seen Bladder: Appears normal for degree of bladder distention. IMPRESSION: 1. Both kidneys are markedly echogenic suggesting chronic medical renal disease. 2. No acute findings.  No hydronephrosis. Electronically Signed   By: Franki Cabot M.D.   On: Apr 25, 2018 11:40    Scheduled Meds: . amLODipine  2.5 mg Oral Daily  . aspirin EC  81 mg Oral Daily  . ezetimibe  10 mg Oral Daily  . ferrous sulfate  325 mg Oral Q breakfast  . heparin  5,000 Units Subcutaneous Q8H  . hydrALAZINE  10 mg Oral Q8H  .  insulin aspart  0-9 Units Subcutaneous TID WC  . insulin glargine  6 Units Subcutaneous QHS  . isosorbide mononitrate  30 mg Oral Daily  . omega-3 acid ethyl esters  1 g Oral Daily  . vitamin B-12  1,000 mcg Oral Daily   Continuous Infusions: . sodium chloride 1,000 mL (04/16/18 0534)  . cefTRIAXone (ROCEPHIN)  IV 1 g (04/16/18 0535)    Principal Problem:   Elevated troponin Active Problems:   UTI (urinary tract infection)   Acute metabolic encephalopathy   Acute renal failure superimposed on stage 3 chronic kidney disease (HCC)   Coronary artery disease involving native heart without angina pectoris   Chronic systolic CHF (congestive heart failure) (HCC)   History of CVA (cerebrovascular accident)   Fall   Type 2 diabetes, uncontrolled, with renal manifestation (HCC)   Essential hypertension   Prostate cancer (HCC)   Anemia, iron deficiency   HLD (hyperlipidemia)   CKD (chronic kidney disease), stage IV ([place])    Time spent: 46 minutes    [facility] M  NP [facility]  If 7PM-7AM, please contact night-coverage at [email or web address], password [facility] 04/16/2018, 9:18 AM  LOS: 1 day     Patient was seen, examined,treatment plan was discussed with the Advance Practice Provider.  I have personally reviewed the clinical findings, labs, EKG, imaging studies and management of this patient in detail. I have also reviewed the orders written for this patient which were under my direction. I agree with the documentation, as recorded by the Advance Practice Provider.   Colin [name] is a 85 y.o. male here with multiple falls, a UTI and ? AKI vs CKD.  Anemia due to volume dilution and probably chronic disease, no sign of bleeding.  Check iron panel in AM.  Await SNF placement for rehabilitation.  Follows with Dr. Justin Mend for nephrology.  Needs close outpatient follow up as well as a palliative care referral while at SNF for end of life planning.  Geradine Girt, DO  How to contact the [facility] Attending or Consulting provider [place] or covering provider during after hours [place], for this patient?  1. Check the care team in [facility] and look for a) attending/consulting TRH provider listed and b) the [facility] team listed 2. Log into [email or web address] and use [place]'s universal password to access. If you do not have the password, please contact the hospital operator. 3. Locate the [facility] provider you are looking for under [facility] and page to a number that you can be directly reached. 4. If you still have difficulty reaching the provider, please page the [facility] (Director on Call) for the Hospitalists listed on amion for assistance.

## 2018-04-16 NOTE — Plan of Care (Signed)

## 2018-04-16 NOTE — Progress Notes (Signed)
Occupational Therapy Treatment Patient Details Name: Colin [name] MRN: [id number] DOB: 28-Nov-1928 Today's Date: 04/16/2018    History of present illness  Colin [name] is a 85 y.o. 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   OT comments  Pt oob to chair this session mod (A) and motivated by warm blanket it. Pt pleasant but requires mod cues to initiate.   Follow Up Recommendations  SNF;Supervision/Assistance - 24 hour    Equipment Recommendations       Recommendations for Other Services      Precautions / Restrictions Precautions Precautions: Fall Precaution Comments: daughter reports pt has had 3 falls in the last 2 weeks Restrictions Weight Bearing Restrictions: No       Mobility Bed Mobility Overal bed mobility: Needs Assistance Bed Mobility: Rolling;Supine to Sit Rolling: Min assist   Supine to sit: Mod assist     General bed mobility comments: requires (A) to roll to L side. pt reaching with L UE toward therapist with (A) to elevate trunk from bed surface. pt unable to sustain without (A)   Transfers Overall transfer level: Needs assistance Equipment used: Rolling walker (2 wheeled) Transfers: Sit to/from [facility] Sit to Stand: Mod assist Stand pivot transfers: Mod assist       General transfer comment: Pt with posterior lean and requires weight shift to help maintain static standing    Balance Overall balance assessment: Needs assistance Sitting-balance support: Bilateral upper extremity supported;Feet supported Sitting balance-Leahy Scale: Poor     Standing balance support: Bilateral upper  extremity supported;During functional activity Standing balance-Leahy Scale: Poor                             ADL either performed or assessed with clinical judgement   ADL Overall ADL's : Needs assistance/impaired                         Toilet Transfer: Moderate assistance;RW;Stand-pivot Toilet Transfer Details (indicate cue type and reason): simulated bed to chair           General ADL Comments: pt motivated for OOB with warm blankets this session. pt very flat affect and less engaged until presented with warm blankets     Vision       Perception     Praxis      Cognition Arousal/Alertness: Awake/alert Behavior During Therapy: Flat affect Overall Cognitive Status: History of cognitive impairments - at baseline                                 General Comments: pt with baseline memory deficits per daughter        Exercises     Shoulder Instructions       General Comments      Pertinent Vitals/ Pain       Pain Assessment: No/denies pain  Home Living  Prior Functioning/Environment              Frequency  Min 2X/week        Progress Toward Goals  OT Goals(current goals can now be found in the care plan section)  Progress towards OT goals: Progressing toward goals  Acute Rehab OT Goals Patient Stated Goal: to see my wife OT Goal Formulation: With patient Time For Goal Achievement: 04/28/18 Potential to Achieve Goals: Good ADL Goals Pt Will Perform Grooming: with supervision;standing Pt Will Perform Upper Body Bathing: with set-up;with supervision;sitting Pt Will Perform Lower Body Bathing: with set-up;with supervision;sit to/from stand Pt Will Perform Upper Body Dressing: with set-up;with supervision;sitting Pt Will Perform Lower Body Dressing: with set-up;with supervision;sit to/from stand Pt Will Transfer to Toilet: with  supervision;ambulating Pt Will Perform Toileting - Clothing Manipulation and hygiene: with supervision;sit to/from stand  Plan Discharge plan remains appropriate    Co-evaluation                 AM-PAC OT "6 Clicks" Daily Activity     Outcome Measure   Help from another person eating meals?: A Little Help from another person taking care of personal grooming?: A Little Help from another person toileting, which includes using toliet, bedpan, or urinal?: A Lot Help from another person bathing (including washing, rinsing, drying)?: A Lot Help from another person to put on and taking off regular upper body clothing?: A Little Help from another person to put on and taking off regular lower body clothing?: A Lot 6 Click Score: 15    End of Session Equipment Utilized During Treatment: Rolling walker  OT Visit Diagnosis: Unsteadiness on feet (R26.81);Other abnormalities of gait and mobility (R26.89);Muscle weakness (generalized) (M62.81);Other symptoms and signs involving cognitive function   Activity Tolerance Patient tolerated treatment well   Patient Left in chair;with call bell/phone within reach;with chair alarm set   Nurse Communication Mobility status;Precautions        Time: 4431-5400 OT Time Calculation (min): 10 min  Charges: OT General Charges $OT Visit: 1 Visit OT Treatments $Therapeutic Activity: 8-22 mins   Jeri Modena, OTR/L  Acute Rehabilitation Services Pager: [phone] Office: [phone] .    Jeri Modena 04/16/2018, 10:54 AM

## 2018-04-16 NOTE — TOC Progression Note (Signed)
Transition of Care [facility]) - Progression Note    Patient Details  Name: Colin [name] MRN: [id number] Date of Birth: Dec 07, 1928  Transition [facility]) CM/SW [place], LCSW Phone Number: 04/16/2018, 10:39 AM  Clinical Narrative: Emailed daughter list of bed offers and link to CMS Medicare scores.  Expected Discharge Plan: Skilled Nursing Facility Barriers to Discharge: Ship broker  Expected Discharge Plan and Services Expected Discharge Plan: [place]                             HH Arranged: NA [place] Agency: NA   Social Determinants of Health (SDOH) Interventions    Readmission Risk Interventions No flowsheet data found.

## 2018-04-17 DIAGNOSIS — N170 Acute kidney failure with tubular necrosis: Secondary | ICD-10-CM

## 2018-04-17 DIAGNOSIS — N183 Chronic kidney disease, stage 3 (moderate): Secondary | ICD-10-CM

## 2018-04-17 LAB — CBC
HCT: 25.7 % — ABNORMAL LOW (ref 39.0–52.0)
Hemoglobin: 7.6 g/dL — ABNORMAL LOW (ref 13.0–17.0)
MCH: 23.8 pg — ABNORMAL LOW (ref 26.0–34.0)
MCHC: 29.6 g/dL — ABNORMAL LOW (ref 30.0–36.0)
MCV: 80.3 fL (ref 80.0–100.0)
Platelets: 192 10*3/uL (ref 150–400)
RBC: 3.2 MIL/uL — ABNORMAL LOW (ref 4.22–5.81)
RDW: 16.3 % — AB (ref 11.5–15.5)
WBC: 6.1 10*3/uL (ref 4.0–10.5)
nRBC: 0 % (ref 0.0–0.2)

## 2018-04-17 LAB — BASIC METABOLIC PANEL
Anion gap: 8 (ref 5–15)
BUN: 109 mg/dL — ABNORMAL HIGH (ref 8–23)
CALCIUM: 9.4 mg/dL (ref 8.9–10.3)
CO2: 20 mmol/L — ABNORMAL LOW (ref 22–32)
Chloride: 105 mmol/L (ref 98–111)
Creatinine, Ser: 4.54 mg/dL — ABNORMAL HIGH (ref 0.61–1.24)
GFR calc non Af Amer: 11 mL/min — ABNORMAL LOW (ref >60)
GFR, EST AFRICAN AMERICAN: 13 mL/min — AB (ref >60)
Glucose, Bld: 108 mg/dL — ABNORMAL HIGH (ref 70–99)
Potassium: 4.5 mmol/L (ref 3.5–5.1)
Sodium: 133 mmol/L — ABNORMAL LOW (ref 135–145)

## 2018-04-17 LAB — GLUCOSE, CAPILLARY
GLUCOSE-CAPILLARY: 113 mg/dL — AB (ref 70–99)
Glucose-Capillary: 101 mg/dL — ABNORMAL HIGH (ref 70–99)
Glucose-Capillary: 96 mg/dL (ref 70–99)
Glucose-Capillary: 150 mg/dL — ABNORMAL HIGH (ref 70–99)

## 2018-04-17 LAB — IRON AND TIBC
IRON: 6 ug/dL — AB (ref 45–182)
Saturation Ratios: 2 % — ABNORMAL LOW (ref 17.9–39.5)
TIBC: 263 ug/dL (ref 250–450)
UIBC: 257 ug/dL

## 2018-04-17 LAB — FERRITIN: Ferritin: 31 ng/mL (ref 24–336)

## 2018-04-17 MED ORDER — SODIUM CHLORIDE 0.9 % IV SOLN
510.0000 mg | Freq: Once | INTRAVENOUS | Status: AC
  Administered 2018-04-17: 510 mg via INTRAVENOUS
  Filled 2018-04-17: qty 17

## 2018-04-17 NOTE — Progress Notes (Signed)
OT Cancellation Note  Patient Details Name: VASILIOS OTTAWAY MRN: [id number] DOB: [birth year]   Cancelled Treatment:    Reason Eval/Treat Not Completed: Other (comment).  Pt sleeping and refusing everything today per NT. Will try back another day.  Hayk Divis 04/17/2018, 1:59 PM  Lesle Chris, OTR/L Acute Rehabilitation Services [phone] [facility] pager [phone] office 04/17/2018

## 2018-04-17 NOTE — Progress Notes (Addendum)
Progress Note    Colin [name]  SJG:[id number] DOB: 1928/05/06  DOA: 04/14/2018 PCP: Deland Pretty, MD    Brief Narrative:     Medical records reviewed and are as summarized below:  Colin [name] is an 85 y.o. 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  Assessment/Plan:   Principal Problem:   Acute renal failure superimposed on stage 3 chronic kidney disease (HCC) Active Problems:   Coronary artery disease involving native heart without angina pectoris   Chronic systolic CHF (congestive heart failure) (HCC)   History of CVA (cerebrovascular accident)   CKD (chronic kidney disease), stage IV (HCC)   Fall   Type 2 diabetes, uncontrolled, with renal manifestation (HCC)   Anemia, iron deficiency   Essential hypertension   HLD (hyperlipidemia)   Prostate cancer (HCC)   Elevated troponin   UTI (urinary tract infection)   Acute metabolic encephalopathy   Metabolic acidosis  Acute renal failure superimposed on chronic kidney disease: -Renal US with chronic renal disease -suspect this is patient's new baseline -poor HD candidate (follows with Dr. Justin Mend) -outpatient palliative care follow up  UTI: max temp 101.2. rocephin started. Blood cultures no growth so far. Urine culture never sent   -continue rocephin- change to PO when SNF bed available  Metabolic acidosis. Likely related to CKD  Acute metabolic encephalopathy, dementia: at baseline this am. CT scan of the brain showed no acute signs of bleeding. -Continue to monitor  Elevated troponin: Trendeddownward  Patientcontinues to denyany chest pain. Evaluated  byCardiologywho opined elevated troponin related to underlying UTI and acute on chronic renal insufficiency. Recommended medical management and treatment of infection and signed off.   Generalized weakness, frequent falls: likely related to above.  Family had noted that patient had been having more frequent falls prior to arrival.TSH 3.2. PT evaluated and recommended snf - long term prognosis poor -needs palliative care to follow at SNF  Diabetes mellitus type 2:controlled.Patient's hemoglobin A1c is 6.4. cbg this am 133 - Lantus dose decreased from 10 to 6 units daily -Sliding scale insulin -May warrant discontinuing long-acting insulin if patient has hypoglycemic episodes  Anemia CKD plus Fe def -Fe levels low so will give IV Fe    Family Communication/Anticipated D/C date and plan/Code Status    Code Status: Full Code.  Family Communication:  Disposition Plan: snf   Medical Consultants:    None.    Subjective:   "I just don't feel good"  Objective:    Vitals:   04/16/18 1630 04/16/18 1941 04/17/18 0417 04/17/18 0859  BP: 130/72 130/76 114/61 (!) 117/54  Pulse: 77 73 75 75  Resp: 15 18 18 18   Temp: 98.2 F (36.8 C) 97.6 F (36.4 C) 98 F (36.7 C) 98.4 F (36.9 C)  TempSrc: Oral Oral  Oral  SpO2: 100% 99% 96% 96%  Weight:   65 kg   Height:        Intake/Output Summary (Last 24 hours) at 04/17/2018 1112 Last data filed at 04/17/2018 0300 Gross per 24 hour  Intake 1140 ml  Output 750 ml  Net 390 ml   Filed Weights   04/15/18 0420 04/16/18 0429 04/17/18 0417  Weight: 64.7 kg 65.9 kg 65 kg  Exam: Not as interactive today rrr No increased work of breathing Moves all ext alert  Data Reviewed:   I have personally reviewed following labs and imaging studies:  Labs: Labs show the following:   Basic Metabolic Panel: Recent Labs  Lab 04/14/18 0101 04/16/18 0420 04/17/18 0454  NA 137 135 133*  K 5.0 4.6 4.5  CL 109 109 105  CO2  20* 16* 20*  GLUCOSE 142* 137* 108*  BUN 105* 107* 109*  CREATININE 4.18* 4.51* 4.54*  CALCIUM 9.4 9.2 9.4   GFR Estimated Creatinine Clearance: 10.5 mL/min (A) (by C-G formula based on SCr of 4.54 mg/dL (H)). Liver Function Tests: Recent Labs  Lab 04/14/18 0101  AST 30  ALT 24  ALKPHOS 71  BILITOT 0.5  PROT 7.2  ALBUMIN 3.1*   No results for input(s): LIPASE, AMYLASE in the last 168 hours. No results for input(s): AMMONIA in the last 168 hours. Coagulation profile No results for input(s): INR, PROTIME in the last 168 hours.  CBC: Recent Labs  Lab 04/14/18 0101 04/16/18 0420 04/17/18 0454  WBC 5.4 6.5 6.1  NEUTROABS 3.4  --   --   HGB 9.2* 7.6* 7.6*  HCT 31.3* 25.9* 25.7*  MCV 82.8 79.9* 80.3  PLT 215 190 192   Cardiac Enzymes: Recent Labs  Lab 04/14/18 0101 04/14/18 0505 04/14/18 1106 04/14/18 1637  TROPONINI 0.12* 0.10* 0.10* 0.09*   BNP (last 3 results) No results for input(s): PROBNP in the last 8760 hours. CBG: Recent Labs  Lab 04/16/18 0617 04/16/18 1216 04/16/18 1616 04/16/18 2108 04/17/18 0605  GLUCAP 133* 115* 172* 95 101*   D-Dimer: No results for input(s): DDIMER in the last 72 hours. Hgb A1c: No results for input(s): HGBA1C in the last 72 hours. Lipid Profile: No results for input(s): CHOL, HDL, LDLCALC, TRIG, CHOLHDL, LDLDIRECT in the last 72 hours. Thyroid function studies: No results for input(s): TSH, T4TOTAL, T3FREE, THYROIDAB in the last 72 hours.  Invalid input(s): FREET3 Anemia work up: Recent Labs    04/17/18 0454  FERRITIN 31  TIBC 263  IRON 6*   Sepsis Labs: Recent Labs  Lab 04/14/18 0101 04/16/18 0420 04/17/18 0454  WBC 5.4 6.5 6.1    Microbiology Recent Results (from the past 240 hour(s))  Culture, blood (Routine X 2) w Reflex to ID Panel     Status: None (Preliminary result)   Collection Time: 04/14/18  5:10 AM  Result Value Ref Range Status   Specimen Description BLOOD LEFT WRIST  Final   Special  Requests   Final    BOTTLES DRAWN AEROBIC AND ANAEROBIC Blood Culture results may not be optimal due to an inadequate volume of blood received in culture bottles   Culture   Final    NO GROWTH 3 DAYS Performed at [facility], [place] [address], [place], [place] [address]    Report Status PENDING  Incomplete  Culture, blood (Routine X 2) w Reflex to ID Panel     Status: None (Preliminary result)   Collection Time: 04/14/18  5:21 AM  Result Value Ref Range Status   Specimen Description BLOOD RIGHT HAND  Final   Special Requests   Final    BOTTLES DRAWN AEROBIC AND ANAEROBIC Blood Culture results may not be optimal due to an inadequate volume of blood received in culture bottles   Culture   Final    NO GROWTH 3 DAYS Performed at [facility], [place] [address] [place], [place]  [address]    Report Status PENDING  Incomplete    Procedures and diagnostic studies:  No results found.  Medications:   . amLODipine  2.5 mg Oral Daily  . aspirin EC  81 mg Oral Daily  . ezetimibe  10 mg Oral Daily  . ferrous sulfate  325 mg Oral Q breakfast  . heparin  5,000 Units Subcutaneous Q8H  . hydrALAZINE  10 mg Oral Q8H  . insulin aspart  0-9 Units Subcutaneous TID WC  . insulin glargine  6 Units Subcutaneous QHS  . isosorbide mononitrate  30 mg Oral Daily  . omega-3 acid ethyl esters  1 g Oral Daily  . vitamin B-12  1,000 mcg Oral Daily   Continuous Infusions: . sodium chloride 1,000 mL (04/17/18 0455)  . cefTRIAXone (ROCEPHIN)  IV 1 g (04/17/18 0457)     LOS: 2 days   Geradine Girt  [facility]   How to contact the [facility] Attending or Consulting provider [place] or covering provider during after hours [place], for this patient?  1. Check the care team in [facility] and look for a) attending/consulting TRH provider listed and b) the [facility] team listed 2. Log into [email or web address] and use [place]'s universal password to access. If you do not have the password, please contact the  hospital operator. 3. Locate the [facility] provider you are looking for under [facility] and page to a number that you can be directly reached. 4. If you still have difficulty reaching the provider, please page the [facility] (Director on Call) for the Hospitalists listed on amion for assistance.  04/17/2018, 11:12 AM

## 2018-04-17 NOTE — TOC Progression Note (Addendum)
Transition of Care [facility]) - Progression Note    Patient Details  Name: Colin [name] MRN: [id number] Date of Birth: 1928/09/25  Transition of Care [facility]) CM/SW [place], LCSW Phone Number: 04/17/2018, 1:25 PM  Clinical Narrative: Received call from patient's daughter. She has chosen [facility] due to proximity to her home. Left message for admissions coordinator to notify.  3:44 pm: CSW has tried contacting SNF admissions coordinator numerous times over the past 2 1/2 hours without a response. CSW left voicemail for daughter explaining situation and asking her and her brother to choose a second facility choice in case CSW is still unable to reach [facility] admissions coordinator.  3:56 pm: Received call from [facility]. They are checking patient's benefits and aware patient will likely discharge tomorrow per MD.  Expected Discharge Plan: Skilled Nursing Facility Barriers to Discharge: Insurance Authorization  Expected Discharge Plan and Services Expected Discharge Plan: [place]: NA HH Agency: NA   Social Determinants of Health (SDOH) Interventions    Readmission Risk Interventions No flowsheet data found.

## 2018-04-17 NOTE — Progress Notes (Signed)
PT Cancellation Note  Patient Details Name: Colin [name] MRN: [id number] DOB: 1928/02/23   Cancelled Treatment:    Reason Eval/Treat Not Completed: Patient declined, no reason specified. Will follow-up for PT treatment as schedule permits.  Mabeline Caras, PT, DPT Acute Rehabilitation Services  Pager [phone] Office [place] 04/17/2018, 9:48 AM

## 2018-04-17 NOTE — TOC Progression Note (Signed)
Transition of Care [facility]) - Progression Note    Patient Details  Name: Colin [name] MRN: [id number] Date of Birth: Dec 21, 1928  Transition of Care [facility]) CM/SW [place], LCSW Phone Number: 04/17/2018, 9:53 AM  Clinical Narrative: Called daughter. She has not narrowed down SNF list yet. She has forwarded the list to her brother for him to review as well.  Expected Discharge Plan: Skilled Nursing Facility Barriers to Discharge: Ship broker  Expected Discharge Plan and Services Expected Discharge Plan: [place]                             HH Arranged: NA [place] Agency: NA   Social Determinants of Health (SDOH) Interventions    Readmission Risk Interventions No flowsheet data found.

## 2018-04-18 DIAGNOSIS — J189 Pneumonia, unspecified organism: Secondary | ICD-10-CM | POA: Diagnosis not present

## 2018-04-18 DIAGNOSIS — E785 Hyperlipidemia, unspecified: Secondary | ICD-10-CM | POA: Diagnosis not present

## 2018-04-18 DIAGNOSIS — D509 Iron deficiency anemia, unspecified: Secondary | ICD-10-CM | POA: Diagnosis not present

## 2018-04-18 DIAGNOSIS — N184 Chronic kidney disease, stage 4 (severe): Secondary | ICD-10-CM | POA: Diagnosis not present

## 2018-04-18 DIAGNOSIS — I2510 Atherosclerotic heart disease of native coronary artery without angina pectoris: Secondary | ICD-10-CM | POA: Diagnosis not present

## 2018-04-18 DIAGNOSIS — Z7401 Bed confinement status: Secondary | ICD-10-CM | POA: Diagnosis not present

## 2018-04-18 DIAGNOSIS — E1122 Type 2 diabetes mellitus with diabetic chronic kidney disease: Secondary | ICD-10-CM | POA: Diagnosis not present

## 2018-04-18 DIAGNOSIS — I5033 Acute on chronic diastolic (congestive) heart failure: Secondary | ICD-10-CM | POA: Diagnosis not present

## 2018-04-18 DIAGNOSIS — I639 Cerebral infarction, unspecified: Secondary | ICD-10-CM | POA: Diagnosis not present

## 2018-04-18 DIAGNOSIS — I5022 Chronic systolic (congestive) heart failure: Secondary | ICD-10-CM | POA: Diagnosis not present

## 2018-04-18 DIAGNOSIS — I517 Cardiomegaly: Secondary | ICD-10-CM | POA: Diagnosis not present

## 2018-04-18 DIAGNOSIS — G8929 Other chronic pain: Secondary | ICD-10-CM | POA: Diagnosis not present

## 2018-04-18 DIAGNOSIS — R269 Unspecified abnormalities of gait and mobility: Secondary | ICD-10-CM | POA: Diagnosis not present

## 2018-04-18 DIAGNOSIS — N179 Acute kidney failure, unspecified: Secondary | ICD-10-CM | POA: Diagnosis not present

## 2018-04-18 DIAGNOSIS — G9341 Metabolic encephalopathy: Secondary | ICD-10-CM | POA: Diagnosis not present

## 2018-04-18 DIAGNOSIS — I10 Essential (primary) hypertension: Secondary | ICD-10-CM | POA: Diagnosis not present

## 2018-04-18 DIAGNOSIS — R4182 Altered mental status, unspecified: Secondary | ICD-10-CM | POA: Diagnosis not present

## 2018-04-18 DIAGNOSIS — R5381 Other malaise: Secondary | ICD-10-CM | POA: Diagnosis not present

## 2018-04-18 DIAGNOSIS — J449 Chronic obstructive pulmonary disease, unspecified: Secondary | ICD-10-CM | POA: Diagnosis not present

## 2018-04-18 DIAGNOSIS — N170 Acute kidney failure with tubular necrosis: Secondary | ICD-10-CM | POA: Diagnosis not present

## 2018-04-18 DIAGNOSIS — R1310 Dysphagia, unspecified: Secondary | ICD-10-CM | POA: Diagnosis not present

## 2018-04-18 DIAGNOSIS — I130 Hypertensive heart and chronic kidney disease with heart failure and stage 1 through stage 4 chronic kidney disease, or unspecified chronic kidney disease: Secondary | ICD-10-CM | POA: Diagnosis not present

## 2018-04-18 DIAGNOSIS — R7989 Other specified abnormal findings of blood chemistry: Secondary | ICD-10-CM | POA: Diagnosis not present

## 2018-04-18 DIAGNOSIS — M2550 Pain in unspecified joint: Secondary | ICD-10-CM | POA: Diagnosis not present

## 2018-04-18 DIAGNOSIS — E1129 Type 2 diabetes mellitus with other diabetic kidney complication: Secondary | ICD-10-CM | POA: Diagnosis not present

## 2018-04-18 DIAGNOSIS — Z89432 Acquired absence of left foot: Secondary | ICD-10-CM | POA: Diagnosis not present

## 2018-04-18 DIAGNOSIS — N390 Urinary tract infection, site not specified: Secondary | ICD-10-CM | POA: Diagnosis not present

## 2018-04-18 DIAGNOSIS — Z8673 Personal history of transient ischemic attack (TIA), and cerebral infarction without residual deficits: Secondary | ICD-10-CM | POA: Diagnosis not present

## 2018-04-18 LAB — GLUCOSE, CAPILLARY
Glucose-Capillary: 87 mg/dL (ref 70–99)
Glucose-Capillary: 151 mg/dL — ABNORMAL HIGH (ref 70–99)

## 2018-04-18 MED ORDER — INSULIN ASPART 100 UNIT/ML ~~LOC~~ SOLN: 0.0000 [IU] | Freq: Three times a day (TID) | SUBCUTANEOUS | 11 refills | Status: AC

## 2018-04-18 MED ORDER — ALBUTEROL SULFATE (2.5 MG/3ML) 0.083% IN NEBU: 2.5000 mg | INHALATION_SOLUTION | RESPIRATORY_TRACT | 12 refills | Status: AC | PRN

## 2018-04-18 MED ORDER — SENNOSIDES-DOCUSATE SODIUM 8.6-50 MG PO TABS: 1.0000 | ORAL_TABLET | Freq: Every evening | ORAL | Status: AC | PRN

## 2018-04-18 MED ORDER — CEFUROXIME AXETIL 250 MG PO TABS: 250.0000 mg | ORAL_TABLET | Freq: Every day | ORAL | 0 refills | Status: AC

## 2018-04-18 MED ORDER — FUROSEMIDE 40 MG PO TABS
40.0000 mg | ORAL_TABLET | Freq: Every day | ORAL | Status: DC
  Administered 2018-04-18: 40 mg via ORAL
  Filled 2018-04-18: qty 1

## 2018-04-18 MED ORDER — INSULIN GLARGINE 100 UNIT/ML ~~LOC~~ SOLN: 4.0000 [IU] | Freq: Every day | SUBCUTANEOUS | 11 refills | Status: AC

## 2018-04-18 MED ORDER — SENNOSIDES-DOCUSATE SODIUM 8.6-50 MG PO TABS: 1.0000 | ORAL_TABLET | Freq: Every evening | ORAL | Status: DC | PRN

## 2018-04-18 MED ORDER — INSULIN GLARGINE 100 UNIT/ML ~~LOC~~ SOLN: 4.0000 [IU] | Freq: Every day | SUBCUTANEOUS | Status: DC

## 2018-04-18 NOTE — TOC Transition Note (Addendum)
Transition of Care [facility]) - CM/SW Discharge Note   Patient Details  Name: Colin [name] MRN: [id number] Date of Birth: 1928-05-30  Transition of Care [facility]) CM/SW Contact:  Candie Chroman, LCSW Phone Number: 04/18/2018, 11:48 AM   Clinical Narrative:   CSW facilitated patient discharge including contacting patient family and facility to confirm patient discharge plans. Clinical information faxed to facility and family agreeable with plan. CSW arranged ambulance transport via [place] to [facility]. RN to call report prior to discharge [phone] Room [place]).  CSW will sign off for now as social work intervention is no longer needed. Please consult [place] again if new needs arise.  Final next level of care: Skilled Nursing Facility Barriers to Discharge: Barriers Resolved   Patient Goals and CMS Choice Patient states their goals for this hospitalization and ongoing recovery are:: Patient not fully oriented. CMS Medicare.gov Compare Post Acute Care list provided to:: Patient Represenative (must comment)(Emailed to daughter.) Choice offered to / list presented to : Adult Children  Discharge Placement   Existing PASRR number confirmed : 04/15/18          Patient chooses bed at: [facility] Patient to be transferred to facility by: [place] Name of family member notified: Left voicemail for daughter Luanna Salk Patient and family notified of of transfer: 04/18/18  Discharge Plan and Services                    HH Arranged: NA [place] Agency: NA   Social Determinants of Health ([place]) Interventions     Readmission Risk Interventions No flowsheet data found.

## 2018-04-18 NOTE — Progress Notes (Signed)
Physical Therapy Treatment Patient Details Name: Colin [name] MRN: [id number] DOB: 1928-06-23 Today's Date: 04/18/2018    History of Present Illness  Colin [name] is a 85 y.o. male admitted 04/14/18 with generalized weakness, fall, incontitnence and confusion. Worked up for UTI and AKI on CKD3; poor HD candidate. 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   PT Comments    Pt requesting to sit up to eat breakfast. Agreeable to stand and take steps to recliner, requiring up to maxA and RW to maintain standing balance due to generalized weakness and posterior lean. Educ pt on importance of mobility since he declined services yesterday and currently requiring increased assist. Also required assist for meal set-up, which he reports daughter helps with.   Follow Up Recommendations  SNF;Supervision for mobility/OOB     Equipment Recommendations  None recommended by PT    Recommendations for Other Services       Precautions / Restrictions Precautions Precautions: Fall Precaution Comments: daughter reports pt has had 3 falls in the last 2 weeks Restrictions Weight Bearing Restrictions: No Other Position/Activity Restrictions: pt has custom shoes - to help his feet with multiple amputations.    Mobility  Bed Mobility Overal bed mobility: Needs Assistance Bed Mobility: Supine to Sit     Supine to sit: Max assist     General bed mobility comments: MaxA to assist trunk elevation with UE support  Transfers Overall transfer level: Needs assistance Equipment used: 1 person hand held assist;Rolling walker (2 wheeled)   Sit to Stand: Max assist;Mod assist;From elevated surface         General transfer comment: MaxA to assist trunk elevation with bilateral HHA, pt with posterior lean difficulty translating weight forward. ModA to assist second standing trial to RW  Ambulation/Gait Ambulation/Gait assistance: Mod assist Gait  Distance (Feet): 3 Feet Assistive device: Rolling walker (2 wheeled) Gait Pattern/deviations: Step-to pattern;Leaning posteriorly;Trunk flexed Gait velocity: Decreased   General Gait Details: Slow steps from bed to recliner with RW; significant posterior lean requiring mod-maxA to prevent LOB. Pt not correcting despite multimodal cues. Declined further mobility due to wanting to eat breakfast   Stairs             Wheelchair Mobility    Modified Rankin (Stroke Patients Only)       Balance Overall balance assessment: Needs assistance Sitting-balance support: Feet supported;No upper extremity supported Sitting balance-Leahy Scale: Fair     Standing balance support: Bilateral upper extremity supported;During functional activity Standing balance-Leahy Scale: Poor Standing balance comment: Reliant on UE support and external assist                            Cognition Arousal/Alertness: Awake/alert Behavior During Therapy: Flat affect Overall Cognitive Status: History of cognitive impairments - at baseline                                 General Comments: pt with baseline memory deficits per daughter      Exercises      General Comments        Pertinent Vitals/Pain Pain Assessment: No/denies pain    Home Living                      Prior Function  PT Goals (current goals can now be found in the care plan section) Acute Rehab PT Goals Patient Stated Goal: Eat that breakfast tray PT Goal Formulation: With patient Time For Goal Achievement: 04/21/18 Potential to Achieve Goals: Fair Progress towards PT goals: Progressing toward goals    Frequency    Min 3X/week      PT Plan Current plan remains appropriate    Co-evaluation              AM-PAC PT "6 Clicks" Mobility   Outcome Measure  Help needed turning from your back to your side while in a flat bed without using bedrails?: A Lot Help needed  moving from lying on your back to sitting on the side of a flat bed without using bedrails?: A Lot Help needed moving to and from a bed to a chair (including a wheelchair)?: A Lot Help needed standing up from a chair using your arms (e.g., wheelchair or bedside chair)?: A Lot Help needed to walk in hospital room?: A Lot Help needed climbing 3-5 steps with a railing? : Total 6 Click Score: 11    End of Session Equipment Utilized During Treatment: Gait belt Activity Tolerance: Patient limited by fatigue Patient left: in chair;with call bell/phone within reach;with chair alarm set Nurse Communication: Mobility status PT Visit Diagnosis: Unsteadiness on feet (R26.81);Repeated falls (R29.6);Muscle weakness (generalized) (M62.81)     Time: 2353-6144 PT Time Calculation (min) (ACUTE ONLY): 12 min  Charges:  $Therapeutic Activity: 8-22 mins                    Mabeline Caras, PT, DPT Acute Rehabilitation Services  Pager [phone] Office [phone]  Derry Lory 04/18/2018, 10:14 AM

## 2018-04-18 NOTE — Care Management Important Message (Signed)
Important Message  Patient Details  Name: CLIDE REMMERS MRN: [id number] Date of Birth: 1928/03/03   Medicare Important Message Given:  Yes    Eulala Newcombe 04/18/2018, 2:35 PM

## 2018-04-18 NOTE — Discharge Summary (Addendum)
Physician Discharge Summary  Colin [name] TDD:[id number] DOB: Oct 01, 1928 DOA: 04/14/2018  PCP: Deland Pretty, MD  Admit date: 04/14/2018 Discharge date: 04/18/2018  Admitted From: home Discharge disposition: SNF   Recommendations for Outpatient Follow-Up:   1. Palliative care to follow at SNF 2. Monitor blood sugar- may need to d/c long- acting lantus based on fating sugars and only treat with SSI 3. Stop ceftin on 3/29   Discharge Diagnosis:   Principal Problem:   Acute renal failure superimposed on stage 3 chronic kidney disease (HCC) Active Problems:   Coronary artery disease involving native heart without angina pectoris   Chronic systolic CHF (congestive heart failure) (HCC)   History of CVA (cerebrovascular accident)   CKD (chronic kidney disease), stage IV ([place])   Fall   Type 2 diabetes, uncontrolled, with renal manifestation (HCC)   Anemia, iron deficiency   Essential hypertension   HLD (hyperlipidemia)   Prostate cancer (HCC)   Elevated troponin   UTI (urinary tract infection)   Acute metabolic encephalopathy   Metabolic acidosis    Discharge Condition: Improved.  Diet recommendation: Low sodium, heart healthy.  Carbohydrate-modified  Wound care: None.  Code status: Full.   History of Present Illness:   Colin [name] is a 85 y.o. 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  Per EDP, pt's daughter reported that pt has been having generalized weakness, poor appetite, decreased oral intake, being more confused recently. He fell several times recently, including fall happened this evening. Per EMS, pt soaked in  urine and when family asked for clean clothes. They said all of his clothing is soiled. Pt smells of pungent urine. When I saw pt in ED, he is alert, but confused. He knows his own name clearly, but not is oriented to place and time.  He does not have active cough, respiratory distress, nausea, vomiting, diarrhea.  He moves all extremities normally.  He states that he had some chest pain recently, but not today.  He also said he had some discomfort in urinating, but cannot give detailed information.  Patient EKG showed T wave inversion in lateral leads and J-point elevation in V1-V4.  Code STEMI was called at arrival.  Dr. Charissa Bash evaluated patient, who did not think patient has STEMI since pt does not have any chest pain.   Hospital Course by Problem:   Acute renal failure superimposed on chronic kidney disease -Renal US with chronic renal disease -suspect this is patient's new baseline -poor HD candidate (follows with Dr. Justin Mend) -outpatient palliative care follow up  UTI: max temp 101.2. rocephin started. Blood cultures no growth so far. Urine culture never sent  -continue rocephin- change to PO for total of 7 days of treatment  Acute metabolic encephalopathy, dementia:at baseline this am.CT scan of the brain showed no acute signs of bleeding. -Continue to monitor  Elevated troponin: Trendeddownward  Patientcontinues to denyany chest pain. Evaluated byCardiologywho opined elevated troponin related to underlying UTI and acute on chronic renal insufficiency. Recommended medical management and treatment of infection and signed off.   Generalized weakness, frequent falls:likely related to above.Family had noted that patient had been having more frequent falls prior to arrival.TSH 3.2. PT evaluated and recommended snf - long  term prognosis poor -needs palliative care to follow at SNF  Diabetes mellitus type 2:controlled.Patient's hemoglobin A1c is 6.4 - Lantus dose  decreased from 10 to 4 units daily -Sliding scale insulin -May warrant discontinuing long-acting insulin if patient has hypoglycemic episodes  Anemia CKD plus Fe def -Fe levels low so will give IV Fe -follows with Dr. Justin Mend     Medical Consultants:    cards  Discharge Exam:   Vitals:   04/18/18 1017 04/18/18 1111  BP: (!) 123/58 111/68  Pulse: 70 68  Resp:  16  Temp:  98.4 F (36.9 C)  SpO2: 98% 100%   Vitals:   04/17/18 1935 04/18/18 0436 04/18/18 1017 04/18/18 1111  BP: 138/77 (!) 121/55 (!) 123/58 111/68  Pulse: 69 67 70 68  Resp: 18 16  16   Temp: 99 F (37.2 C) 98.6 F (37 C)  98.4 F (36.9 C)  TempSrc: Oral Oral  Oral  SpO2: 99% 98% 98% 100%  Weight:      Height:        General exam: Appears calm and comfortable.   The results of significant diagnostics from this hospitalization (including imaging, microbiology, ancillary and laboratory) are listed below for reference.     Procedures and Diagnostic Studies:   Dg Chest 2 View  Result Date: 04/14/2018 CLINICAL DATA:  Altered mental status. EXAM: CHEST - 2 VIEW COMPARISON:  09/17/2015 FINDINGS: Postsurgical changes from CABG. Cardiomediastinal silhouette is mildly enlarged. Mediastinal contours appear intact. Eventration of bilateral hemidiaphragms. There is no evidence of focal airspace consolidation, pleural effusion or pneumothorax. Bilateral lower lobe atelectasis. Osseous structures are without acute abnormality. Soft tissues are grossly normal. IMPRESSION: 1. Bilateral lower lobe atelectasis. 2. Mildly enlarged cardiac silhouette. Electronically Signed   By: Fidela Salisbury M.D.   On: 04/14/2018 01:31   Ct Head Wo Contrast  Result Date: 04/14/2018 CLINICAL DATA:  Altered level of consciousness, history prostate cancer, cardiomyopathy, dementia, coronary artery disease post MI, CHF, diabetes mellitus, COPD, hypertension, stroke EXAM: CT HEAD WITHOUT CONTRAST TECHNIQUE: Contiguous axial images were  obtained from the base of the skull through the vertex without intravenous contrast. Sagittal and coronal MPR images reconstructed from axial data set. COMPARISON:  02/20/2015 FINDINGS: Brain: Generalized atrophy. Normal ventricular morphology. No midline shift or mass effect. Small vessel chronic ischemic changes of deep cerebral white matter. Small old LEFT frontal infarct. No intracranial hemorrhage, mass lesion, evidence of acute infarction, or extra-axial fluid collection. Vascular: Atherosclerotic calcifications of internal carotid and vertebral arteries at skull base Skull: Calvaria intact. Mixed lytic and sclerotic process identified within the sphenoid from central to LEFT obliterating the majority of the sphenoid sinus, unchanged from prior exams, likely a benign process such as Paget's disease. Sinuses/Orbits: Paranasal sinuses and mastoid air cells clear Other: N/A IMPRESSION: Atrophy with small vessel chronic ischemic changes of deep cerebral white matter. Small old LEFT frontal infarct. No acute intracranial abnormalities. 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   On: 04/14/2018 02:29   US Renal  Result Date: 04/14/2018 CLINICAL DATA:  Acute renal failure EXAM: RENAL / URINARY TRACT ULTRASOUND COMPLETE COMPARISON:  None. FINDINGS: Right Kidney: Renal measurements: 9.2 x 5.6 x 5.3 cm = volume: 145 mL. RIGHT renal cortex is markedly echogenic throughout. No mass or hydronephrosis seen. Left Kidney: Renal measurements: 9.8 x 5.8 x 5 cm = volume: 147 mL. LEFT renal cortex is also markedly echogenic throughout. No mass or hydronephrosis seen  Bladder: Appears normal for degree of bladder distention. IMPRESSION: 1. Both kidneys are markedly echogenic suggesting chronic medical renal disease. 2. No acute findings.  No hydronephrosis. Electronically Signed   By: Franki Cabot M.D.   On: 04/14/2018 11:40     Labs:   Basic Metabolic Panel: Recent Labs    Lab 04/14/18 0101 04/16/18 0420 04/17/18 0454  NA 137 135 133*  K 5.0 4.6 4.5  CL 109 109 105  CO2 20* 16* 20*  GLUCOSE 142* 137* 108*  BUN 105* 107* 109*  CREATININE 4.18* 4.51* 4.54*  CALCIUM 9.4 9.2 9.4   GFR Estimated Creatinine Clearance: 10.5 mL/min (A) (by C-G formula based on SCr of 4.54 mg/dL (H)). Liver Function Tests: Recent Labs  Lab 04/14/18 0101  AST 30  ALT 24  ALKPHOS 71  BILITOT 0.5  PROT 7.2  ALBUMIN 3.1*   No results for input(s): LIPASE, AMYLASE in the last 168 hours. No results for input(s): AMMONIA in the last 168 hours. Coagulation profile No results for input(s): INR, PROTIME in the last 168 hours.  CBC: Recent Labs  Lab 04/14/18 0101 04/16/18 0420 04/17/18 0454  WBC 5.4 6.5 6.1  NEUTROABS 3.4  --   --   HGB 9.2* 7.6* 7.6*  HCT 31.3* 25.9* 25.7*  MCV 82.8 79.9* 80.3  PLT 215 190 192   Cardiac Enzymes: Recent Labs  Lab 04/14/18 0101 04/14/18 0505 04/14/18 1106 04/14/18 1637  TROPONINI 0.12* 0.10* 0.10* 0.09*   BNP: Invalid input(s): POCBNP CBG: Recent Labs  Lab 04/17/18 0605 04/17/18 1129 04/17/18 1708 04/17/18 2112 04/18/18 0627  GLUCAP 101* 96 113* 150* 87   D-Dimer No results for input(s): DDIMER in the last 72 hours. Hgb A1c No results for input(s): HGBA1C in the last 72 hours. Lipid Profile No results for input(s): CHOL, HDL, LDLCALC, TRIG, CHOLHDL, LDLDIRECT in the last 72 hours. Thyroid function studies No results for input(s): TSH, T4TOTAL, T3FREE, THYROIDAB in the last 72 hours.  Invalid input(s): FREET3 Anemia work up Recent Labs    04/17/18 0454  FERRITIN 31  TIBC 263  IRON 6*   Microbiology Recent Results (from the past 240 hour(s))  Culture, blood (Routine X 2) w Reflex to ID Panel     Status: None (Preliminary result)   Collection Time: 04/14/18  5:10 AM  Result Value Ref Range Status   Specimen Description BLOOD LEFT WRIST  Final   Special Requests   Final    BOTTLES DRAWN AEROBIC AND  ANAEROBIC Blood Culture results may not be optimal due to an inadequate volume of blood received in culture bottles   Culture   Final    NO GROWTH 4 DAYS Performed at [facility], [place] [address] [place], [place] [address]    Report Status PENDING  Incomplete  Culture, blood (Routine X 2) w Reflex to ID Panel     Status: None (Preliminary result)   Collection Time: 04/14/18  5:21 AM  Result Value Ref Range Status   Specimen Description BLOOD RIGHT HAND  Final   Special Requests   Final    BOTTLES DRAWN AEROBIC AND ANAEROBIC Blood Culture results may not be optimal due to an inadequate volume of blood received in culture bottles   Culture   Final    NO GROWTH 4 DAYS Performed at [facility], [place] [address] [place], [place] [address]    Report Status PENDING  Incomplete     Discharge Instructions:   Discharge  Instructions    Diet - low sodium heart healthy   Complete by:  As directed    Increase activity slowly   Complete by:  As directed      Allergies as of 04/18/2018      Reactions   Lisinopril Other (See Comments)   REACTION:  Elevated potassium,renal insuf      Medication List    STOP taking these medications   Lantus SoloStar 100 UNIT/ML Solostar Pen Generic drug:  Insulin Glargine Replaced by:  insulin glargine 100 UNIT/ML injection     TAKE these medications   albuterol (2.5 MG/3ML) 0.083% nebulizer solution Commonly known as:  PROVENTIL Take 3 mLs (2.5 mg total) by nebulization every 4 (four) hours as needed for wheezing or shortness of breath.   amLODipine 2.5 MG tablet Commonly known as:  NORVASC Take 2.5 mg daily by mouth.   aspirin EC 81 MG tablet Take 81 mg by mouth daily.   cefUROXime 250 MG tablet Commonly known as:  Ceftin Take 1 tablet (250 mg total) by mouth daily for 10 days.   ezetimibe 10 MG tablet Commonly known as:  ZETIA Take 1 tablet (10 mg total) by mouth daily.   ferrous sulfate 325 (65 FE) MG tablet Take 1 tablet  (325 mg total) by mouth daily with breakfast.   Fish Oil 1000 MG Caps Take 1,000 mg by mouth daily.   furosemide 40 MG tablet Commonly known as:  LASIX TAKE 1 TABLET BY MOUTH EVERY DAY   hydrALAZINE 10 MG tablet Commonly known as:  APRESOLINE Take 1 tablet (10 mg total) by mouth every 8 (eight) hours. Please schedule an appt for future refills. 2nd attempt.   insulin aspart 100 UNIT/ML injection Commonly known as:  novoLOG Inject 0-9 Units into the skin 3 (three) times daily with meals.   insulin glargine 100 UNIT/ML injection Commonly known as:  LANTUS Inject 0.04 mLs (4 Units total) into the skin at bedtime. Replaces:  Lantus SoloStar 100 UNIT/ML Solostar Pen   isosorbide mononitrate 30 MG 24 hr tablet Commonly known as:  IMDUR Take 1 tablet (30 mg total) by mouth daily. ** DO NOT CRUSH **please make overdue appt with Dr. Acie Fredrickson before anymore refills. 3rd attempt   senna-docusate 8.6-50 MG tablet Commonly known as:  Senokot-S Take 1 tablet by mouth at bedtime as needed for mild constipation.   vitamin B-12 1000 MCG tablet Commonly known as:  CYANOCOBALAMIN Take 1,000 mcg by mouth daily.       Contact information for follow-up providers    Deland Pretty, MD Follow up in 1 week(s).   Specialty:  Internal Medicine Contact information: [address] [place] [address] [phone]            Contact information for after-discharge care    [facility] .   Service:  Skilled Nursing Contact information: [place]                   Time coordinating discharge: 25 min  Signed:  Geradine Girt DO  [facility] 04/18/2018, 12:03 PM

## 2018-04-18 NOTE — Progress Notes (Addendum)
Patient does not have a documented BM in last couple days per computer.   Per a previous nurse- pt had a BM on Monday 23rd,  did not have one on 24th, unsure about yesterday.   Messaged MD-  Will add a PRN stool softener since patient is taking iron.

## 2018-04-18 NOTE — Consult Note (Signed)
   [facility] Inpatient Consult   04/18/2018  Colin [name] Apr 12, 1928 [id number]   Patient screened for transition of care with high risk score for unplanned readmissions [23%] and disposition needs.  Chart reviewed and patient is being recommended for a skilled nursing facility with [employer].  Patient/family currently planning for transition to skilled facility.  No community care management needs for this transition. Please place a [facility] Care Management consult or for questions contact:   Natividad Brood, RN BSN [facility] Liaison  [phone] business mobile phone Toll free office [phone]

## 2018-04-18 NOTE — Progress Notes (Signed)
Pt needs increased assistance with mobility since evaluation.  Pt would not engage in ADLs today; based on clinical judgment, He is at a similar level with increased assistance for the mobility Portion of this.  Will continue to follow and adjust POC as needed  04/18/18 1100  OT Visit Information  Last OT Received On 04/18/18  Assistance Needed +1  History of Present Illness  Colin [name] is a 85 y.o. male admitted 04/14/18 with generalized weakness, fall, incontitnence and confusion. Worked up for UTI and AKI on CKD3; poor HD candidate. 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  Precautions  Precautions Fall  Pain Assessment  Pain Assessment No/denies pain  Cognition  Arousal/Alertness Awake/alert  Behavior During Therapy Flat affect  Overall Cognitive Status History of cognitive impairments - at baseline  General Comments pt with baseline memory deficits per daughter  Upper Extremity Assessment  Upper Extremity Assessment Overall WFL for tasks assessed  ADL  Toilet Transfer Moderate assistance;Maximal assistance;Stand-pivot;RW  General ADL Comments pt stood for one minute for pressure relief. Asked to return to bed. Pt with posterior lean during standing and SPT.  Cues for safety as pt attempted to sit prematurely  Bed Mobility  Sit to supine Mod assist  General bed mobility comments assist for both legs  Balance  Standing balance-Leahy Scale Poor  Standing balance comment posterior lean   Restrictions  Weight Bearing Restrictions No  Transfers  Equipment used Rolling walker (2 wheeled)  Sit to Stand Mod assist;Max assist  Stand pivot transfers Mod assist  General transfer comment mod max A to rise and stabilize; multimodal cues and assist to weight shift forward  OT - End of Session  Activity Tolerance Patient tolerated treatment well  Patient left in bed;with call bell/phone within reach;with bed alarm set  OT  Assessment/Plan  OT Plan Discharge plan needs to be updated  OT Frequency (ACUTE ONLY) Min 2X/week  Follow Up Recommendations SNF  OT Equipment 3 in 1 bedside commode  AM-PAC OT "6 Clicks" Daily Activity Outcome Measure (Version 2)  Help from another person eating meals? 3  Help from another person taking care of personal grooming? 3  Help from another person toileting, which includes using toliet, bedpan, or urinal? 2  Help from another person bathing (including washing, rinsing, drying)? 2  Help from another person to put on and taking off regular upper body clothing? 3  Help from another person to put on and taking off regular lower body clothing? 2  6 Click Score 15  OT Goal Progression  Progress towards OT goals Not progressing toward goals - comment (pt weaker than upon eval)  OT Time Calculation  OT Start Time (ACUTE ONLY) 1047  OT Stop Time (ACUTE ONLY) 1058  OT Time Calculation (min) 11 min  OT General Charges  $OT Visit 1 Visit  OT Treatments  $Therapeutic Activity 8-22 mins  Lesle Chris, OTR/L Acute Rehabilitation Services [phone] [facility] pager [phone] office 04/18/2018

## 2018-04-19 LAB — CULTURE, BLOOD (ROUTINE X 2)
Culture: NO GROWTH
Culture: NO GROWTH

## 2018-05-13 ENCOUNTER — Inpatient Hospital Stay: Payer: Medicare Other | Admitting: Oncology

## 2018-05-13 ENCOUNTER — Inpatient Hospital Stay: Payer: Medicare Other

## 2018-05-13 ENCOUNTER — Inpatient Hospital Stay: Payer: Medicare Other | Attending: Oncology

## 2018-05-13 ENCOUNTER — Telehealth: Payer: Self-pay | Admitting: *Deleted

## 2018-05-13 NOTE — Telephone Encounter (Signed)
Called daughter to follow up on [place] today. She reports he was admitted to [facility] in [place] on 04/18/18. She reports he is doing well. Is up OOB and participating in physical therapy. Patients are still not allowed to leave facility and return unless it is an emergency. Will call facility to schedule his F/U when restrictions are lifted.

## 2018-05-16 DIAGNOSIS — Z89432 Acquired absence of left foot: Secondary | ICD-10-CM | POA: Diagnosis not present

## 2018-05-16 DIAGNOSIS — J449 Chronic obstructive pulmonary disease, unspecified: Secondary | ICD-10-CM | POA: Diagnosis not present

## 2018-05-16 DIAGNOSIS — G9341 Metabolic encephalopathy: Secondary | ICD-10-CM | POA: Diagnosis not present

## 2018-05-16 DIAGNOSIS — N184 Chronic kidney disease, stage 4 (severe): Secondary | ICD-10-CM | POA: Diagnosis not present

## 2018-05-16 DIAGNOSIS — I639 Cerebral infarction, unspecified: Secondary | ICD-10-CM | POA: Diagnosis not present

## 2018-06-03 DIAGNOSIS — E785 Hyperlipidemia, unspecified: Secondary | ICD-10-CM | POA: Diagnosis not present

## 2018-06-03 DIAGNOSIS — E1129 Type 2 diabetes mellitus with other diabetic kidney complication: Secondary | ICD-10-CM | POA: Diagnosis not present

## 2018-06-06 ENCOUNTER — Inpatient Hospital Stay (HOSPITAL_COMMUNITY)
Admission: EM | Admit: 2018-06-06 | Discharge: 2018-06-08 | DRG: 683 | Disposition: A | Discharge status: Hospice | Payer: Medicare Other | Source: Skilled Nursing Facility | Attending: Family Medicine | Admitting: Family Medicine

## 2018-06-06 ENCOUNTER — Inpatient Hospital Stay (HOSPITAL_COMMUNITY): Payer: Medicare Other

## 2018-06-06 ENCOUNTER — Emergency Department (HOSPITAL_COMMUNITY): Payer: Medicare Other

## 2018-06-06 ENCOUNTER — Other Ambulatory Visit: Payer: Self-pay

## 2018-06-06 ENCOUNTER — Encounter (HOSPITAL_COMMUNITY): Payer: Self-pay | Admitting: Emergency Medicine

## 2018-06-06 DIAGNOSIS — Z833 Family history of diabetes mellitus: Secondary | ICD-10-CM

## 2018-06-06 DIAGNOSIS — Z8679 Personal history of other diseases of the circulatory system: Secondary | ICD-10-CM

## 2018-06-06 DIAGNOSIS — Z8249 Family history of ischemic heart disease and other diseases of the circulatory system: Secondary | ICD-10-CM

## 2018-06-06 DIAGNOSIS — Z03818 Encounter for observation for suspected exposure to other biological agents ruled out: Secondary | ICD-10-CM | POA: Diagnosis not present

## 2018-06-06 DIAGNOSIS — I252 Old myocardial infarction: Secondary | ICD-10-CM

## 2018-06-06 DIAGNOSIS — Z7401 Bed confinement status: Secondary | ICD-10-CM | POA: Diagnosis not present

## 2018-06-06 DIAGNOSIS — E1151 Type 2 diabetes mellitus with diabetic peripheral angiopathy without gangrene: Secondary | ICD-10-CM | POA: Diagnosis not present

## 2018-06-06 DIAGNOSIS — I2510 Atherosclerotic heart disease of native coronary artery without angina pectoris: Secondary | ICD-10-CM | POA: Diagnosis present

## 2018-06-06 DIAGNOSIS — R0902 Hypoxemia: Secondary | ICD-10-CM | POA: Diagnosis not present

## 2018-06-06 DIAGNOSIS — C799 Secondary malignant neoplasm of unspecified site: Secondary | ICD-10-CM | POA: Diagnosis present

## 2018-06-06 DIAGNOSIS — E785 Hyperlipidemia, unspecified: Secondary | ICD-10-CM | POA: Diagnosis not present

## 2018-06-06 DIAGNOSIS — N179 Acute kidney failure, unspecified: Principal | ICD-10-CM

## 2018-06-06 DIAGNOSIS — L89152 Pressure ulcer of sacral region, stage 2: Secondary | ICD-10-CM | POA: Diagnosis present

## 2018-06-06 DIAGNOSIS — F0390 Unspecified dementia without behavioral disturbance: Secondary | ICD-10-CM | POA: Diagnosis present

## 2018-06-06 DIAGNOSIS — Z87891 Personal history of nicotine dependence: Secondary | ICD-10-CM

## 2018-06-06 DIAGNOSIS — J90 Pleural effusion, not elsewhere classified: Secondary | ICD-10-CM | POA: Diagnosis not present

## 2018-06-06 DIAGNOSIS — M25551 Pain in right hip: Secondary | ICD-10-CM | POA: Diagnosis present

## 2018-06-06 DIAGNOSIS — Z515 Encounter for palliative care: Secondary | ICD-10-CM

## 2018-06-06 DIAGNOSIS — Z89422 Acquired absence of other left toe(s): Secondary | ICD-10-CM

## 2018-06-06 DIAGNOSIS — R627 Adult failure to thrive: Secondary | ICD-10-CM | POA: Diagnosis not present

## 2018-06-06 DIAGNOSIS — D509 Iron deficiency anemia, unspecified: Secondary | ICD-10-CM | POA: Diagnosis not present

## 2018-06-06 DIAGNOSIS — Z7189 Other specified counseling: Secondary | ICD-10-CM

## 2018-06-06 DIAGNOSIS — I129 Hypertensive chronic kidney disease with stage 1 through stage 4 chronic kidney disease, or unspecified chronic kidney disease: Secondary | ICD-10-CM | POA: Diagnosis not present

## 2018-06-06 DIAGNOSIS — K219 Gastro-esophageal reflux disease without esophagitis: Secondary | ICD-10-CM | POA: Diagnosis not present

## 2018-06-06 DIAGNOSIS — Z9861 Coronary angioplasty status: Secondary | ICD-10-CM

## 2018-06-06 DIAGNOSIS — J449 Chronic obstructive pulmonary disease, unspecified: Secondary | ICD-10-CM | POA: Diagnosis not present

## 2018-06-06 DIAGNOSIS — L8990 Pressure ulcer of unspecified site, unspecified stage: Secondary | ICD-10-CM

## 2018-06-06 DIAGNOSIS — Z66 Do not resuscitate: Secondary | ICD-10-CM | POA: Diagnosis not present

## 2018-06-06 DIAGNOSIS — C61 Malignant neoplasm of prostate: Secondary | ICD-10-CM | POA: Diagnosis present

## 2018-06-06 DIAGNOSIS — Z1159 Encounter for screening for other viral diseases: Secondary | ICD-10-CM

## 2018-06-06 DIAGNOSIS — Z87440 Personal history of urinary (tract) infections: Secondary | ICD-10-CM

## 2018-06-06 DIAGNOSIS — R188 Other ascites: Secondary | ICD-10-CM | POA: Diagnosis not present

## 2018-06-06 DIAGNOSIS — Z888 Allergy status to other drugs, medicaments and biological substances status: Secondary | ICD-10-CM

## 2018-06-06 DIAGNOSIS — Z89412 Acquired absence of left great toe: Secondary | ICD-10-CM

## 2018-06-06 DIAGNOSIS — E8770 Fluid overload, unspecified: Secondary | ICD-10-CM | POA: Diagnosis not present

## 2018-06-06 DIAGNOSIS — Z95820 Peripheral vascular angioplasty status with implants and grafts: Secondary | ICD-10-CM | POA: Diagnosis not present

## 2018-06-06 DIAGNOSIS — I517 Cardiomegaly: Secondary | ICD-10-CM | POA: Diagnosis not present

## 2018-06-06 DIAGNOSIS — G8929 Other chronic pain: Secondary | ICD-10-CM | POA: Diagnosis not present

## 2018-06-06 DIAGNOSIS — Z794 Long term (current) use of insulin: Secondary | ICD-10-CM | POA: Diagnosis not present

## 2018-06-06 DIAGNOSIS — N184 Chronic kidney disease, stage 4 (severe): Secondary | ICD-10-CM | POA: Diagnosis not present

## 2018-06-06 DIAGNOSIS — I255 Ischemic cardiomyopathy: Secondary | ICD-10-CM | POA: Diagnosis present

## 2018-06-06 DIAGNOSIS — I495 Sick sinus syndrome: Secondary | ICD-10-CM | POA: Diagnosis not present

## 2018-06-06 DIAGNOSIS — R410 Disorientation, unspecified: Secondary | ICD-10-CM | POA: Diagnosis not present

## 2018-06-06 DIAGNOSIS — Z8673 Personal history of transient ischemic attack (TIA), and cerebral infarction without residual deficits: Secondary | ICD-10-CM

## 2018-06-06 DIAGNOSIS — I10 Essential (primary) hypertension: Secondary | ICD-10-CM

## 2018-06-06 DIAGNOSIS — Z79899 Other long term (current) drug therapy: Secondary | ICD-10-CM

## 2018-06-06 DIAGNOSIS — R531 Weakness: Secondary | ICD-10-CM | POA: Diagnosis not present

## 2018-06-06 DIAGNOSIS — M2550 Pain in unspecified joint: Secondary | ICD-10-CM | POA: Diagnosis not present

## 2018-06-06 DIAGNOSIS — E11649 Type 2 diabetes mellitus with hypoglycemia without coma: Secondary | ICD-10-CM | POA: Diagnosis not present

## 2018-06-06 DIAGNOSIS — D631 Anemia in chronic kidney disease: Secondary | ICD-10-CM | POA: Diagnosis not present

## 2018-06-06 DIAGNOSIS — E1122 Type 2 diabetes mellitus with diabetic chronic kidney disease: Secondary | ICD-10-CM | POA: Diagnosis not present

## 2018-06-06 DIAGNOSIS — M1990 Unspecified osteoarthritis, unspecified site: Secondary | ICD-10-CM | POA: Diagnosis present

## 2018-06-06 DIAGNOSIS — Z951 Presence of aortocoronary bypass graft: Secondary | ICD-10-CM

## 2018-06-06 DIAGNOSIS — N189 Chronic kidney disease, unspecified: Secondary | ICD-10-CM | POA: Diagnosis present

## 2018-06-06 DIAGNOSIS — R54 Age-related physical debility: Secondary | ICD-10-CM | POA: Diagnosis present

## 2018-06-06 DIAGNOSIS — Z8349 Family history of other endocrine, nutritional and metabolic diseases: Secondary | ICD-10-CM

## 2018-06-06 DIAGNOSIS — R5381 Other malaise: Secondary | ICD-10-CM | POA: Diagnosis not present

## 2018-06-06 LAB — COMPREHENSIVE METABOLIC PANEL
ALT: 15 U/L (ref 0–44)
AST: 18 U/L (ref 15–41)
Albumin: 3.2 g/dL — ABNORMAL LOW (ref 3.5–5.0)
Alkaline Phosphatase: 57 U/L (ref 38–126)
Anion gap: 14 (ref 5–15)
BUN: 151 mg/dL — ABNORMAL HIGH (ref 8–23)
CO2: 19 mmol/L — ABNORMAL LOW (ref 22–32)
Calcium: 8.3 mg/dL — ABNORMAL LOW (ref 8.9–10.3)
Chloride: 107 mmol/L (ref 98–111)
Creatinine, Ser: 7.71 mg/dL — ABNORMAL HIGH (ref 0.61–1.24)
GFR calc Af Amer: 7 mL/min — ABNORMAL LOW (ref >60)
GFR calc non Af Amer: 6 mL/min — ABNORMAL LOW (ref >60)
Glucose, Bld: 120 mg/dL — ABNORMAL HIGH (ref 70–99)
Potassium: 4.9 mmol/L (ref 3.5–5.1)
Sodium: 140 mmol/L (ref 135–145)
Total Bilirubin: 0.6 mg/dL (ref 0.3–1.2)
Total Protein: 7.2 g/dL (ref 6.5–8.1)

## 2018-06-06 LAB — CBC WITH DIFFERENTIAL/PLATELET
Abs Immature Granulocytes: 0.01 10*3/uL (ref 0.00–0.07)
Basophils Absolute: 0 10*3/uL (ref 0.0–0.1)
Basophils Relative: 1 %
Eosinophils Absolute: 0 10*3/uL (ref 0.0–0.5)
Eosinophils Relative: 0 %
HCT: 37.4 % — ABNORMAL LOW (ref 39.0–52.0)
Hemoglobin: 11.3 g/dL — ABNORMAL LOW (ref 13.0–17.0)
Immature Granulocytes: 0 %
Lymphocytes Relative: 18 %
Lymphs Abs: 0.7 10*3/uL (ref 0.7–4.0)
MCH: 26 pg (ref 26.0–34.0)
MCHC: 30.2 g/dL (ref 30.0–36.0)
MCV: 86.2 fL (ref 80.0–100.0)
Monocytes Absolute: 0.3 10*3/uL (ref 0.1–1.0)
Monocytes Relative: 8 %
Neutro Abs: 2.9 10*3/uL (ref 1.7–7.7)
Neutrophils Relative %: 73 %
Platelets: 145 10*3/uL — ABNORMAL LOW (ref 150–400)
RBC: 4.34 MIL/uL (ref 4.22–5.81)
RDW: 21.9 % — ABNORMAL HIGH (ref 11.5–15.5)
WBC: 4 10*3/uL (ref 4.0–10.5)
nRBC: 0 % (ref 0.0–0.2)

## 2018-06-06 LAB — URINALYSIS, ROUTINE W REFLEX MICROSCOPIC
Bilirubin Urine: NEGATIVE
Glucose, UA: NEGATIVE mg/dL
Ketones, ur: NEGATIVE mg/dL
Nitrite: NEGATIVE
Protein, ur: 100 mg/dL — AB
Specific Gravity, Urine: 1.012 (ref 1.005–1.030)
WBC, UA: >50 WBC/hpf — ABNORMAL HIGH (ref 0–5)
pH: 5 (ref 5.0–8.0)

## 2018-06-06 LAB — SARS CORONAVIRUS 2 BY RT PCR (HOSPITAL ORDER, PERFORMED IN ~~LOC~~ HOSPITAL LAB): SARS Coronavirus 2: NEGATIVE

## 2018-06-06 LAB — MRSA PCR SCREENING: MRSA by PCR: NEGATIVE

## 2018-06-06 LAB — GLUCOSE, CAPILLARY: Glucose-Capillary: 119 mg/dL — ABNORMAL HIGH (ref 70–99)

## 2018-06-06 MED ORDER — ALBUTEROL SULFATE (2.5 MG/3ML) 0.083% IN NEBU: 2.5000 mg | INHALATION_SOLUTION | RESPIRATORY_TRACT | Status: DC | PRN

## 2018-06-06 MED ORDER — AMLODIPINE BESYLATE 5 MG PO TABS
2.5000 mg | ORAL_TABLET | Freq: Every day | ORAL | Status: DC
  Administered 2018-06-07: 2.5 mg via ORAL
  Filled 2018-06-06: qty 1

## 2018-06-06 MED ORDER — EZETIMIBE 10 MG PO TABS
10.0000 mg | ORAL_TABLET | Freq: Every day | ORAL | Status: DC
  Administered 2018-06-07 – 2018-06-08 (×2): 10 mg via ORAL
  Filled 2018-06-06 (×2): qty 1

## 2018-06-06 MED ORDER — HYDRALAZINE HCL 10 MG PO TABS
10.0000 mg | ORAL_TABLET | Freq: Three times a day (TID) | ORAL | Status: DC
  Administered 2018-06-06 – 2018-06-07 (×2): 10 mg via ORAL
  Filled 2018-06-06 (×2): qty 1

## 2018-06-06 MED ORDER — ACETAMINOPHEN 325 MG PO TABS: 650.0000 mg | ORAL_TABLET | Freq: Four times a day (QID) | ORAL | Status: DC | PRN

## 2018-06-06 MED ORDER — ACETAMINOPHEN 650 MG RE SUPP: 650.0000 mg | Freq: Four times a day (QID) | RECTAL | Status: DC | PRN

## 2018-06-06 MED ORDER — VITAMIN B-12 1000 MCG PO TABS
1000.0000 ug | ORAL_TABLET | Freq: Every day | ORAL | Status: DC
  Administered 2018-06-07 – 2018-06-08 (×2): 1000 ug via ORAL
  Filled 2018-06-06 (×2): qty 1

## 2018-06-06 MED ORDER — SODIUM CHLORIDE 0.9% FLUSH: 3.0000 mL | Freq: Two times a day (BID) | INTRAVENOUS | Status: DC

## 2018-06-06 MED ORDER — MAGNESIUM HYDROXIDE 400 MG/5ML PO SUSP: 30.0000 mL | Freq: Every day | ORAL | Status: DC | PRN

## 2018-06-06 MED ORDER — INSULIN ASPART 100 UNIT/ML ~~LOC~~ SOLN: 0.0000 [IU] | Freq: Every day | SUBCUTANEOUS | Status: DC

## 2018-06-06 MED ORDER — INSULIN ASPART 100 UNIT/ML ~~LOC~~ SOLN: 0.0000 [IU] | Freq: Three times a day (TID) | SUBCUTANEOUS | Status: DC

## 2018-06-06 MED ORDER — ISOSORBIDE MONONITRATE ER 30 MG PO TB24
30.0000 mg | ORAL_TABLET | Freq: Every day | ORAL | Status: DC
  Administered 2018-06-07 – 2018-06-08 (×2): 30 mg via ORAL
  Filled 2018-06-06 (×2): qty 1

## 2018-06-06 MED ORDER — SODIUM CHLORIDE 0.9 % IV BOLUS
1000.0000 mL | Freq: Once | INTRAVENOUS | Status: AC
  Administered 2018-06-06: 1000 mL via INTRAVENOUS

## 2018-06-06 MED ORDER — SODIUM CHLORIDE 0.9 % IV SOLN
INTRAVENOUS | Status: DC
  Administered 2018-06-06 – 2018-06-07 (×2): via INTRAVENOUS

## 2018-06-06 MED ORDER — INSULIN GLARGINE 100 UNIT/ML ~~LOC~~ SOLN
4.0000 [IU] | Freq: Every day | SUBCUTANEOUS | Status: DC
  Administered 2018-06-06: 4 [IU] via SUBCUTANEOUS
  Filled 2018-06-06: qty 0.04

## 2018-06-06 MED ORDER — SENNOSIDES-DOCUSATE SODIUM 8.6-50 MG PO TABS: 1.0000 | ORAL_TABLET | Freq: Every evening | ORAL | Status: DC | PRN

## 2018-06-06 MED ORDER — HEPARIN SODIUM (PORCINE) 5000 UNIT/ML IJ SOLN
5000.0000 [IU] | Freq: Three times a day (TID) | INTRAMUSCULAR | Status: DC
  Administered 2018-06-06 – 2018-06-08 (×4): 5000 [IU] via SUBCUTANEOUS
  Filled 2018-06-06 (×5): qty 1

## 2018-06-06 MED ORDER — FERROUS SULFATE 325 (65 FE) MG PO TABS
325.0000 mg | ORAL_TABLET | Freq: Every day | ORAL | Status: DC
  Administered 2018-06-07 – 2018-06-08 (×2): 325 mg via ORAL
  Filled 2018-06-06 (×2): qty 1

## 2018-06-06 NOTE — ED Provider Notes (Signed)
[facility] DEPT Provider Note   CSN: [id number] Arrival date & time: 06/06/18  1215    History   Chief Complaint Chief Complaint  Patient presents with  . abnormal labs    HPI Colin [name] is a 85 y.o. male.     85 yo M with a chief complaint of worsening renal dysfunction.  The patient lives in a nursing home secondary to dementia he is unable to provide any further history.  Per EMS he had lab work that was done today and was found to be in renal failure and was sent here for evaluation.  Patient denies abdominal pain denies chest pain denies shortness of breath denies difficulty urinating.  Level 5 caveat dementia  The history is provided by the patient.  Illness  Severity:  Mild Onset quality:  Sudden Duration:  1 day Timing:  Constant Progression:  Worsening Chronicity:  New   Past Medical History:  Diagnosis Date  . Anemia 10/29/2013  . Arthritis   . Bradycardia   . Cardiomyopathy   . Carotid artery disease ([place])    a. Carotid US 2/17: R 50-69, L plaque.  . Chronic systolic CHF (congestive heart failure) ([place])   . CKD (chronic kidney disease), stage IV ([place])   . COPD (chronic obstructive pulmonary disease) ([place])   . Coronary artery disease    Status post CABG in 2004  . Dementia ([place])   . Diabetes mellitus with peripheral artery disease ([place])   . Dyslipidemia   . GERD (gastroesophageal reflux disease)   . History of anemia   . History of CVA (cerebrovascular accident)   . Hypertension   . Myocardial infarction ([place])   . Peripheral vascular disease ([place])    Status post right femoropopliteal bypass  . Prostate cancer ([place])    metastatic  . Prostate cancer ([place]) 12/11/2016  . Prostate cancer ([place]) 12/11/2016  . SSS (sick sinus syndrome) ([place])    a. s/p PPM 2007 with lead vegetation in 2015 s/p extraction of pacemaker leads.    Patient Active Problem List   Diagnosis Date Noted  . Metabolic acidosis 35/36/1443  .  Acute renal failure superimposed on stage 3 chronic kidney disease ([place]) 04/15/2018  . Elevated troponin 04/14/2018  . UTI (urinary tract infection) 04/14/2018  . Acute metabolic encephalopathy 15/40/0867  . Prostate cancer ([place]) 12/11/2016  . Essential hypertension 12/07/2015  . HLD (hyperlipidemia) 12/07/2015  . Sinus bradycardia 11/17/2015  . 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 Anemia, iron deficiency 11/06/2013  . Anemia of chronic disease 11/06/2013  . Acute blood loss anemia 10/29/2013  . Weakness generalized 10/20/2013  . Bacteremia 10/20/2013  . Type 2 diabetes, uncontrolled, with renal manifestation ([place]) 06/03/2013  . PAD (peripheral artery disease) ([place]) 04/30/2013  . Other stiff joint, of the lower leg 12/31/2012  . Fall 07/01/2012  . Hypoglycemia 07/01/2012  . Hypothermia 07/01/2012  . Failure to thrive in adult 02/27/2012  . Non-compliant patient 02/27/2012  . Microscopic hematuria 01/11/2012  . Syncope 01/09/2012  . CKD (chronic kidney disease), stage IV ([place]) 01/09/2012  . Diabetes mellitus with peripheral artery disease ([place])   . Pain in limb 12/29/2011  . Chronic systolic CHF (congestive heart failure) ([place])   . Acute kidney injury ([place])   . History of CVA (cerebrovascular accident)   . Ischemic cardiomyopathy   . COPD (chronic obstructive pulmonary disease) ([place])   . History of anemia   .  Coronary artery disease involving native heart without angina pectoris 11/30/2009  . Cardiac pacemaker in situ 11/30/2009    Past Surgical History:  Procedure Laterality Date  . ABDOMINAL ANGIOGRAM  04/30/2013   Procedure: ABDOMINAL ANGIOGRAM;  Surgeon: Mal Misty, MD;  Location: [facility] CATH LAB;  Service: Cardiovascular;;  . AMPUTATION Left 05/05/2013   Procedure: AMPUTATION RAY- FIRST AND SECOND-LEFT FOOT;  Surgeon: Newt Minion, MD;  Location: [facility];  Service: Orthopedics;  Laterality: Left;  . CARDIAC  CATHETERIZATION  04/29/2002   Dr. Glade Lloyd  . CORONARY ANGIOPLASTY    . CORONARY ARTERY BYPASS GRAFT  4 of 2004   4 vessel bypass  . ESOPHAGOGASTRODUODENOSCOPY  07/21/2011   Procedure: ESOPHAGOGASTRODUODENOSCOPY (EGD);  Surgeon: Beryle Beams, MD;  Location: [facility] ENDOSCOPY;  Service: Endoscopy;  Laterality: N/A;  . FEMORAL BYPASS  july 2006   right with right great toe amputation  . FEMORAL-POPLITEAL BYPASS GRAFT Left 05/01/2013   Procedure: LEFT FEMORAL TO ABOVE KNEE POPLITEAL ARTERY BYPASS GRAFT WITH INTRAOPERATIVE ARTEROGRAM;  Surgeon: Mal Misty, MD;  Location: [facility];  Service: Vascular;  Laterality: Left;  . INSERT / REPLACE / REMOVE PACEMAKER  05/17/2005   st. jude dual chamber ddd mode   . LOWER EXTREMITY ANGIOGRAM N/A 04/30/2013   Procedure: LOWER EXTREMITY ANGIOGRAM;  Surgeon: Mal Misty, MD;  Location: [facility] CATH LAB;  Service: Cardiovascular;  Laterality: N/A;  . PACEMAKER LEAD REMOVAL Right 10/27/2013   Procedure: PACEMAKER LEAD REMOVAL;  Surgeon: Evans Lance, MD;  Location: [facility];  Service: Cardiovascular;  Laterality: Right;  Roxy Manns backing up  . TEE WITHOUT CARDIOVERSION N/A 10/23/2013   Procedure: TRANSESOPHAGEAL ECHOCARDIOGRAM (TEE);  Surgeon: Dorothy Spark, MD;  Location: [facility];  Service: Cardiovascular;  Laterality: N/A;  . TEE WITHOUT CARDIOVERSION N/A 10/28/2013   Procedure: TRANSESOPHAGEAL ECHOCARDIOGRAM (TEE);  Surgeon: Dorothy Spark, MD;  Location: [facility] ENDOSCOPY;  Service: Cardiovascular;  Laterality: N/A;        Home Medications    Prior to Admission medications   Medication Sig Start Date End Date Taking? Authorizing Provider  albuterol (PROVENTIL) (2.5 MG/3ML) 0.083% nebulizer solution Take 3 mLs (2.5 mg total) by nebulization every 4 (four) hours as needed for wheezing or shortness of breath. 04/18/18   Eulogio Bear U, DO  amLODipine (NORVASC) 2.5 MG tablet Take 2.5 mg daily by mouth. 10/25/16   [provider]  aspirin EC 81 MG tablet Take  81 mg by mouth daily.    [provider]  ezetimibe (ZETIA) 10 MG tablet Take 1 tablet (10 mg total) by mouth daily. 11/17/15   Nahser, Wonda Cheng, MD  ferrous sulfate 325 (65 FE) MG tablet Take 1 tablet (325 mg total) by mouth daily with breakfast. Patient not taking: Reported on 04/15/2018 10/31/13   Delfina Redwood, MD  furosemide (LASIX) 40 MG tablet TAKE 1 TABLET BY MOUTH EVERY DAY Patient taking differently: Take 40 mg by mouth daily.  01/17/17   Evans Lance, MD  hydrALAZINE (APRESOLINE) 10 MG tablet Take 1 tablet (10 mg total) by mouth every 8 (eight) hours. Please schedule an appt for future refills. 2nd attempt. 03/01/18   Nahser, Wonda Cheng, MD  insulin aspart (NOVOLOG) 100 UNIT/ML injection Inject 0-9 Units into the skin 3 (three) times daily with meals. 04/18/18   Geradine Girt, DO  insulin glargine (LANTUS) 100 UNIT/ML injection Inject 0.04 mLs (4 Units total) into the skin at bedtime. 04/18/18   Geradine Girt,  DO  isosorbide mononitrate (IMDUR) 30 MG 24 hr tablet Take 1 tablet (30 mg total) by mouth daily. ** DO NOT CRUSH **please make overdue appt with Dr. Acie Fredrickson before anymore refills. 3rd attempt 01/31/18   Nahser, Wonda Cheng, MD  Omega-3 Fatty Acids (FISH OIL) 1000 MG CAPS Take 1,000 mg by mouth daily.     [provider]  senna-docusate (SENOKOT-S) 8.6-50 MG tablet Take 1 tablet by mouth at bedtime as needed for mild constipation. 04/18/18   Geradine Girt, DO  vitamin B-12 (CYANOCOBALAMIN) 1000 MCG tablet Take 1,000 mcg by mouth daily.    [provider]    Family History Family History  Problem Relation Age of Onset  . Hyperlipidemia Mother   . Heart disease Father   . Diabetes Sister   . Hypertension Sister   . Hyperlipidemia Sister   . Diabetes Other     Social History Social History   Tobacco Use  . Smoking status: Former Smoker    Packs/day: 1.00    Years: 50.00    Pack years: 50.00    Types: Cigarettes    Last attempt to quit:  06/09/2002    Years since quitting: 16.0  . Smokeless tobacco: Never Used  Substance Use Topics  . Alcohol use: No  . Drug use: No     Allergies   Lisinopril   Review of Systems Review of Systems  Unable to perform ROS: Dementia     Physical Exam Updated Vital Signs BP 132/77   Pulse (!) 50   Temp 98.1 F (36.7 C) (Oral)   SpO2 100%   Physical Exam Vitals signs and nursing note reviewed.  Constitutional:      Appearance: He is well-developed.  HENT:     Head: Normocephalic and atraumatic.  Eyes:     Pupils: Pupils are equal, round, and reactive to light.  Neck:     Musculoskeletal: Normal range of motion and neck supple.     Vascular: No JVD.  Cardiovascular:     Rate and Rhythm: Normal rate and regular rhythm.     Heart sounds: No murmur. No friction rub. No gallop.   Pulmonary:     Effort: No respiratory distress.     Breath sounds: No wheezing.  Abdominal:     General: There is no distension.     Tenderness: There is no guarding or rebound.  Musculoskeletal: Normal range of motion.     Comments: Right above the ankle amputation  Skin:    Coloration: Skin is not pale.     Findings: No rash.  Neurological:     Mental Status: He is alert.     Comments: Patient is confused to scenario      ED Treatments / Results  Labs (all labs ordered are listed, but only abnormal results are displayed) Labs Reviewed  COMPREHENSIVE METABOLIC PANEL - Abnormal; Notable for the following components:      Result Value   CO2 19 (*)    Glucose, Bld 120 (*)    Creatinine, Ser 7.71 (*)    Calcium 8.3 (*)    Albumin 3.2 (*)    GFR calc non Af Amer 6 (*)    GFR calc Af Amer 7 (*)    All other components within normal limits  SARS CORONAVIRUS 2 (HOSPITAL ORDER, [facility])  CBC WITH DIFFERENTIAL/PLATELET  URINALYSIS, ROUTINE W REFLEX MICROSCOPIC    EKG None  Radiology No results found.  Procedures  Procedures (including critical care  time) Procedure note: Ultrasound Guided Peripheral IV Ultrasound guided peripheral 1.88 inch angiocath IV placement performed by me. Indications: Nursing unable to place IV. Details: The antecubital fossa and upper arm were evaluated with a multifrequency linear probe. Patent brachial veins were noted. 1 attempt was made to cannulate a vein under realtime US guidance with successful cannulation of the vein and catheter placement. There is return of non-pulsatile dark red blood. The patient tolerated the procedure well without complications. Images archived electronically.  CPT codes: [phone] and [phone]  Medications Ordered in ED Medications  sodium chloride 0.9 % bolus 1,000 mL (1,000 mLs Intravenous New Bag/Given 06/06/18 1503)     Initial Impression / Assessment and Plan / ED Course  I have reviewed the triage vital signs and the nursing notes.  Pertinent labs & imaging results that were available during my care of the patient were reviewed by me and considered in my medical decision making (see chart for details).        85 yo M with a chief complaint of worsening renal dysfunction.  Patient was sent by his nursing home for baseline creatinine of 4 that is now gone to a 7.  His BUN was on measurably high.  Patient is listed to have dementia but he may have slightly worse mental status from baseline per EMS.  Will repeat lab work give a bolus of fluids, scanned the bladder and reassess.   Bladder scan 157.  Patient care was signed out to Dr. Vallery Ridge please see her note for further details care in the ED.  The patients results and plan were reviewed and discussed.   Any x-rays performed were independently reviewed by myself.   Differential diagnosis were considered with the presenting HPI.  Medications  sodium chloride 0.9 % bolus 1,000 mL (1,000 mLs Intravenous New Bag/Given 06/06/18 1503)    Vitals:   06/06/18 1330 06/06/18 1344 06/06/18 1400 06/06/18 1452  BP: (!) 115/56  132/77    Pulse:  (!) 57  (!) 50  Temp:      TempSrc:      SpO2:  96%  100%    Final diagnoses:  AKI (acute kidney injury) ([place])      Final Clinical Impressions(s) / ED Diagnoses   Final diagnoses:  AKI (acute kidney injury) [facility])    ED Discharge Orders    None       Deno Etienne, DO 06/06/18 1520

## 2018-06-06 NOTE — H&P (Signed)
History and Physical    Colin [name] DXA:[id number] DOB: [birth year] DOA: 06/06/2018  PCP: Deland Pretty, MD  Patient coming from: [place] home  I have personally briefly reviewed patient's old medical records in [place]  Chief Complaint: Abnormal labs/elevated creatinine  HPI: Colin [name] is a 85 y.o. 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  Reportedly, patient did not have any complaint but he was only sent here for further evaluation of his worsening kidney disease.  His labs were confirmed here in the emergency department with a creatinine of 7.71.  Hospital service was consulted to admit the patient.  When seen patient, patient looks very comfortable without requiring any oxygen or in any discomfort.  He has no complaints.  Patient has known dementia which is getting worse progressively according to her daughter who I spoke to over the phone.  Patient knows that he lives at a nursing home and he also knows that he is in the hospital in [place] but does not know why he is sent here.  All he knows that he was told that he needs to go to the hospital.  Of note, he was recently hospitalized about 2 months ago here for acute on chronic kidney disease as well as UTI and slightly elevated troponin which were thought to be secondary to worsening renal disease.  According to note, he was also seen by nephrology but he was deemed not a good candidate for HD so palliative care was recommended however after talking to his daughter, it is not known whether he  ever saw palliative care at the nursing home.  Daughter has no information about that since she has not been able to see the patient for 2 months physically due to COVID-19 situation but at this point in time she wanted him to be full code.  ED Course:  Upon arrival to the emergency department, patient's vitals were stable.  Repeat blood work-up was done which showed significantly elevated creatinine.  His baseline creatinine just about 2 months ago was around 4.5 and today it was 7.71.  Normal WBC.  Normal vitals.  He also underwent CT renal stone which ruled out any obstructive uropathy but it showed cardiomegaly with moderate to large bilateral pleural effusions.  Anasarca and advanced atherosclerotic changes of the abdominal aorta without evidence of an aneurysm.  Review of Systems: As per HPI otherwise 10 point review of systems negative.    Past Medical History:  Diagnosis Date   Anemia 10/29/2013   Arthritis    Bradycardia    Cardiomyopathy    Carotid artery disease ([place])    a. Carotid US 2/17: R 50-69, L plaque.   Chronic systolic CHF (congestive heart failure) (HCC)    CKD (chronic kidney disease), stage IV (HCC)    COPD (chronic obstructive pulmonary disease) (HCC)    Coronary artery disease    Status post CABG in 2004   Dementia ([place])    Diabetes mellitus with peripheral artery disease (HCC)    Dyslipidemia    GERD (gastroesophageal reflux disease)    History of anemia    History of CVA (  cerebrovascular accident)    Hypertension    Myocardial infarction [facility])    Peripheral vascular disease ([place])    Status post right femoropopliteal bypass   Prostate cancer [facility])    metastatic   Prostate cancer ([place]) 12/11/2016   Prostate cancer ([place]) 12/11/2016   SSS (sick sinus syndrome) ([place])    a. s/p PPM 2007 with lead vegetation in 2015 s/p extraction of pacemaker leads.    Past Surgical History:  Procedure Laterality Date   ABDOMINAL ANGIOGRAM  04/30/2013     Procedure: ABDOMINAL ANGIOGRAM;  Surgeon: Mal Misty, MD;  Location: [facility] CATH LAB;  Service: Cardiovascular;;   AMPUTATION Left 05/05/2013   Procedure: AMPUTATION RAY- FIRST AND SECOND-LEFT FOOT;  Surgeon: Newt Minion, MD;  Location: [place];  Service: Orthopedics;  Laterality: Left;   CARDIAC CATHETERIZATION  04/29/2002   Dr. Glade Lloyd   CORONARY ANGIOPLASTY     CORONARY ARTERY BYPASS GRAFT  4 of 2004   4 vessel bypass   ESOPHAGOGASTRODUODENOSCOPY  07/21/2011   Procedure: ESOPHAGOGASTRODUODENOSCOPY (EGD);  Surgeon: Beryle Beams, MD;  Location: [facility];  Service: Endoscopy;  Laterality: N/A;   FEMORAL BYPASS  july 2006   right with right great toe amputation   FEMORAL-POPLITEAL BYPASS GRAFT Left 05/01/2013   Procedure: LEFT FEMORAL TO ABOVE KNEE POPLITEAL ARTERY BYPASS GRAFT WITH INTRAOPERATIVE ARTEROGRAM;  Surgeon: Mal Misty, MD;  Location: [place];  Service: Vascular;  Laterality: Left;   INSERT / REPLACE / REMOVE PACEMAKER  05/17/2005   st. jude dual chamber ddd mode    LOWER EXTREMITY ANGIOGRAM N/A 04/30/2013   Procedure: LOWER EXTREMITY ANGIOGRAM;  Surgeon: Mal Misty, MD;  Location[facility] LAB;  Service: Cardiovascular;  Laterality: N/A;   PACEMAKER LEAD REMOVAL Right 10/27/2013   Procedure: PACEMAKER LEAD REMOVAL;  Surgeon: Evans Lance, MD;  Location: [place];  Service: Cardiovascular;  Laterality: Right;  Roxy Manns backing up   TEE WITHOUT CARDIOVERSION N/A 10/23/2013   Procedure: TRANSESOPHAGEAL ECHOCARDIOGRAM (TEE);  Surgeon: Dorothy Spark, MD;  Location: [place];  Service: Cardiovascular;  Laterality: N/A;   TEE WITHOUT CARDIOVERSION N/A 10/28/2013   Procedure: TRANSESOPHAGEAL ECHOCARDIOGRAM (TEE);  Surgeon: Dorothy Spark, MD;  Location: [facility] ENDOSCOPY;  Service: Cardiovascular;  Laterality: N/A;     reports that he quit smoking about 16 years ago. His smoking use included cigarettes. He has a 50.00 pack-year smoking history. He has never used  smokeless tobacco. He reports that he does not drink alcohol or use drugs.  Allergies  Allergen Reactions   Lisinopril Other (See Comments)    REACTION:  Elevated potassium,renal insuf    Family History  Problem Relation Age of Onset   Hyperlipidemia Mother    Heart disease Father    Diabetes Sister    Hypertension Sister    Hyperlipidemia Sister    Diabetes Other     Prior to Admission medications   Medication Sig Start Date End Date Taking? Authorizing Provider  albuterol (PROVENTIL) (2.5 MG/3ML) 0.083% nebulizer solution Take 3 mLs (2.5 mg total) by nebulization every 4 (four) hours as needed for wheezing or shortness of breath. 04/18/18  Yes Vann, Jessica U, DO  amLODipine (NORVASC) 2.5 MG tablet Take 2.5 mg daily by mouth. 10/25/16  Yes [provider]  ezetimibe (ZETIA) 10 MG tablet Take 1 tablet (10 mg total) by mouth daily. 11/17/15  Yes Nahser, Wonda Cheng, MD  ferrous sulfate 325 (65 FE) MG tablet Take 1 tablet (325 mg total)  by mouth daily with breakfast. 10/31/13  Yes Delfina Redwood, MD  furosemide (LASIX) 40 MG tablet TAKE 1 TABLET BY MOUTH EVERY DAY Patient taking differently: Take 40 mg by mouth daily.  01/17/17  Yes Evans Lance, MD  hydrALAZINE (APRESOLINE) 10 MG tablet Take 1 tablet (10 mg total) by mouth every 8 (eight) hours. Please schedule an appt for future refills. 2nd attempt. 03/01/18  Yes Nahser, Wonda Cheng, MD  insulin aspart (NOVOLOG) 100 UNIT/ML injection Inject 0-9 Units into the skin 3 (three) times daily with meals. 04/18/18  Yes Vann, Jessica U, DO  insulin glargine (LANTUS) 100 UNIT/ML injection Inject 0.04 mLs (4 Units total) into the skin at bedtime. 04/18/18  Yes Eulogio Bear U, DO  isosorbide mononitrate (IMDUR) 30 MG 24 hr tablet Take 1 tablet (30 mg total) by mouth daily. ** DO NOT CRUSH **please make overdue appt with Dr. Acie Fredrickson before anymore refills. 3rd attempt 01/31/18  Yes Nahser, Wonda Cheng, MD  Omega-3 Fatty Acids (FISH OIL) 1000  MG CAPS Take 1,000 mg by mouth daily.    Yes [provider]  senna-docusate (SENOKOT-S) 8.6-50 MG tablet Take 1 tablet by mouth at bedtime as needed for mild constipation. 04/18/18  Yes Geradine Girt, DO  vitamin B-12 (CYANOCOBALAMIN) 1000 MCG tablet Take 1,000 mcg by mouth daily.   Yes [provider]    Physical Exam: Vitals:   06/06/18 1452 06/06/18 1500 06/06/18 1600 06/06/18 1630  BP:  120/78 125/62 (!) 137/95  Pulse: (!) 50     Temp:      TempSrc:      SpO2: 100%       Constitutional: NAD, calm, comfortable Vitals:   06/06/18 1452 06/06/18 1500 06/06/18 1600 06/06/18 1630  BP:  120/78 125/62 (!) 137/95  Pulse: (!) 50     Temp:      TempSrc:      SpO2: 100%      Eyes: PERRL, lids and conjunctivae normal ENMT: Dry membranes are moist. Posterior pharynx clear of any exudate or lesions.Normal dentition.  Neck: normal, supple, no masses, no thyromegaly Respiratory: clear to auscultation bilaterally except slightly decreased breath sounds at the bases bilaterally, no wheezing, no crackles. Normal respiratory effort. No accessory muscle use.  Cardiovascular: Regular rate and rhythm, no murmurs / rubs / gallops. No extremity edema. 2+ pedal pulses. No carotid bruits.  Abdomen: no tenderness, no masses palpated. No hepatosplenomegaly. Bowel sounds positive.  Musculoskeletal: no clubbing / cyanosis. No joint deformity upper and lower extremities. Good ROM, no contractures. Normal muscle tone.  Skin: no rashes, lesions, ulcers. No induration Neurologic: CN 2-12 grossly intact. Sensation intact, DTR normal. Strength 5/5 in all 4.  Psychiatric: Normal judgment and insight. Alert and oriented x 3. Normal mood.    Labs on Admission: I have personally reviewed following labs and imaging studies  CBC: Recent Labs  Lab 06/06/18 1431  WBC 4.0  NEUTROABS 2.9  HGB 11.3*  HCT 37.4*  MCV 86.2  PLT 024*   Basic Metabolic Panel: Recent Labs  Lab 06/06/18 1431  NA  140  K 4.9  CL 107  CO2 19*  GLUCOSE 120*  BUN 151*  CREATININE 7.71*  CALCIUM 8.3*   GFR: CrCl cannot be calculated (Unknown ideal weight.). Liver Function Tests: Recent Labs  Lab 06/06/18 1431  AST 18  ALT 15  ALKPHOS 57  BILITOT 0.6  PROT 7.2  ALBUMIN 3.2*   No results for input(s): LIPASE, AMYLASE in the  last 168 hours. No results for input(s): AMMONIA in the last 168 hours. Coagulation Profile: No results for input(s): INR, PROTIME in the last 168 hours. Cardiac Enzymes: No results for input(s): CKTOTAL, CKMB, CKMBINDEX, TROPONINI in the last 168 hours. BNP (last 3 results) No results for input(s): PROBNP in the last 8760 hours. HbA1C: No results for input(s): HGBA1C in the last 72 hours. CBG: No results for input(s): GLUCAP in the last 168 hours. Lipid Profile: No results for input(s): CHOL, HDL, LDLCALC, TRIG, CHOLHDL, LDLDIRECT in the last 72 hours. Thyroid Function Tests: No results for input(s): TSH, T4TOTAL, FREET4, T3FREE, THYROIDAB in the last 72 hours. Anemia Panel: No results for input(s): VITAMINB12, FOLATE, FERRITIN, TIBC, IRON, RETICCTPCT in the last 72 hours. Urine analysis:    Component Value Date/Time   COLORURINE YELLOW 04/14/2018 0340   APPEARANCEUR HAZY (A) 04/14/2018 0340   LABSPEC 1.014 04/14/2018 0340   PHURINE 5.0 04/14/2018 0340   GLUCOSEU NEGATIVE 04/14/2018 0340   HGBUR SMALL (A) 04/14/2018 0340   BILIRUBINUR NEGATIVE 04/14/2018 0340   KETONESUR NEGATIVE 04/14/2018 0340   PROTEINUR 100 (A) 04/14/2018 0340   UROBILINOGEN 1.0 10/19/2013 0912   NITRITE NEGATIVE 04/14/2018 0340   LEUKOCYTESUR SMALL (A) 04/14/2018 0340    Radiological Exams on Admission: Ct Renal Stone Study  Result Date: 06/06/2018 CLINICAL DATA:  Acute renal failure with hematuria. EXAM: CT ABDOMEN AND PELVIS WITHOUT CONTRAST TECHNIQUE: Multidetector CT imaging of the abdomen and pelvis was performed following the standard protocol without IV contrast.  COMPARISON:  CT abdomen pelvis dated 02/29/2016 FINDINGS: Lower chest: There are moderate bilateral pleural effusions, right greater than left. There is significant cardiomegaly and advanced coronary artery calcifications. The intracardiac blood pool is hypodense relative to the adjacent myocardium consistent with anemia. Hepatobiliary: No focal liver abnormality is seen. Status post cholecystectomy. No biliary dilatation. Pancreas: Unremarkable. No pancreatic ductal dilatation or surrounding inflammatory changes. Spleen: Normal in size without focal abnormality. Adrenals/Urinary Tract: The kidneys are somewhat atrophic bilaterally. There is no hydronephrosis. There are no obstructing radiopaque nephroliths. Vascular calcifications are noted of both renal arteries. Stomach/Bowel: Stomach is within normal limits. Appendix appears normal. No evidence of bowel wall thickening, distention, or inflammatory changes. Vascular/Lymphatic: Aortic atherosclerosis. No enlarged abdominal or pelvic lymph nodes. 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 There is likely moderate to high-grade stenosis of the origin of the SMA secondary to advanced atherosclerotic calcifications. Bilateral lower extremity bypass grafts are noted. Reproductive: Prostate is unremarkable. Other: Anasarca is noted. There is a small volume of abdominal ascites. Musculoskeletal: Stable lytic lesions are noted in the thoracolumbar spine. Multilevel degenerative changes are again seen. IMPRESSION: 1. No hydronephrosis. No radiopaque obstructing nephroliths. The kidneys are somewhat atrophic which can be seen in patients with end-stage renal disease. 2. Cardiomegaly.  Moderate to large bilateral pleural effusions. 3. Anasarca.  There is a small volume of abdominal ascites. 4. Advanced atherosclerotic changes of the abdominal aorta without evidence of an aneurysm. 5. Duplicated IVC, a normal variant. Electronically Signed   By: Constance Holster M.D.    On: 06/06/2018 17:10    EKG: Independently reviewed.   Assessment/Plan Active Problems:   Renal failure (ARF), acute on chronic (HCC)   Acute on chronic kidney disease stage IV: Baseline creatinine 4.5 just about 2 months ago and currently 7.71.  It is not known whether patient has had any issue with the urine production and passing but patient himself denied that and this was not reported by nursing  home either.  This could very well be just because of dehydration since he does look dehydrated clinically.  At this point in time I will start him on normal saline at 125 cc/h and repeat labs in the morning.  If he starts to trend down then we will continue hydration and hold off to nephrology consult but if he has any issue with urination or creatinine starts to trend up despite hydration then we will consult nephrology tomorrow morning.  Avoid all nephrotoxic agents.  Type 2 diabetes mellitus: Home home dose of Lantus and add sliding scale insulin.  Hypertension: Controlled.  Resume all home medications.  History of COPD: Stable resume breathing treatments.  Moderate to large pleural effusion: CT renal stone shows moderate to large pleural effusion however patient seems to be comfortable completely without requiring any oxygen and is not having dyspneic.  The reading seems to be out of proportion of the clinical picture of the patient.  I will get a dedicated 2 view chest x-ray.  DVT prophylaxis: Heparin Code Status: Full code Family Communication: Called and discussed with patient's daughter Colin [name] at [phone] Disposition Plan: Place inpatient with telemetry Consults called: None Admission status: Inpatient   Darliss Cheney MD [facility] Pager [phone]  If 7PM-7AM, please contact night-coverage [email or web address] Password [facility]  06/06/2018, 5:31 PM

## 2018-06-06 NOTE — Progress Notes (Signed)
ED TO INPATIENT HANDOFF REPORT  Name/Age/Gender Colin [name] 85 y.o. male  Code Status Code Status History    Date Active Date Inactive Code Status Order ID Comments User Context   04/14/2018 0501 04/18/2018 1545 Full Code [id number]  Ivor Costa, MD ED   10/29/2013 1526 10/31/2013 2153 Full Code [id number]  Azzie Roup, MD Inpatient   10/27/2013 1209 10/29/2013 1526 Full Code [id number]  Evans Lance, MD Inpatient   10/19/2013 1018 10/27/2013 1209 Full Code [id number]  Reyne Dumas, MD ED   05/12/2013 2223 10/19/2013 1018 Full Code [id number]  Hennie Duos, MD Outpatient   05/05/2013 1722 05/08/2013 1642 Full Code [id number]  Newt Minion, MD Inpatient   05/01/2013 1509 05/05/2013 1722 Full Code [id number]  Gabriel Earing, PA-C Inpatient   04/30/2013 1621 05/01/2013 1509 Full Code [id number]  Gabriel Earing, PA-C Inpatient   07/01/2012 1738 07/02/2012 1815 Full Code [id number]  Janece Canterbury, MD Inpatient   02/27/2012 2301 03/01/2012 1610 Full Code [id number]  Carmon Ginsberg, RN Inpatient   01/09/2012 2038 01/11/2012 1433 Full Code [id number]  Candace Gallus, RN Inpatient   10/26/2011 1632 10/27/2011 0424 Full Code [id number]  Mervin Kung., MD ED      Home/SNF/Other Nursing Home  Chief Complaint abnormal labs  Level of Care/Admitting Diagnosis ED Disposition    ED Disposition Condition [facility]: [facility] [[id number]]  Level of Care: Telemetry [5]  Admit to tele based on following criteria: Other see comments  Comments: ARF  Covid Evaluation: N/A  Diagnosis: Renal failure (ARF), acute on chronic [facility]) [[id number]]  Admitting Physician: Darliss Cheney [[id number]]  Attending Physician: Darliss Cheney [phone]  Estimated length of stay: 3 - 4 days  Certification:: I certify this patient will need inpatient services for at least 2 midnights  PT Class (Do Not Modify): Inpatient [101]  PT Acc Code (Do Not Modify): Private [1]       Medical  History Past Medical History:  Diagnosis Date  . Anemia 10/29/2013  . Arthritis   . Bradycardia   . Cardiomyopathy   . Carotid artery disease ([place])    a. Carotid US 2/17: R 50-69, L plaque.  . Chronic systolic CHF (congestive heart failure) ([place])   . CKD (chronic kidney disease), stage IV ([place])   . COPD (chronic obstructive pulmonary disease) ([place])   . Coronary artery disease    Status post CABG in 2004  . Dementia ([place])   . Diabetes mellitus with peripheral artery disease ([place])   . Dyslipidemia   . GERD (gastroesophageal reflux disease)   . History of anemia   . History of CVA (cerebrovascular accident)   . Hypertension   . Myocardial infarction ([place])   . Peripheral vascular disease ([place])    Status post right femoropopliteal bypass  . Prostate cancer ([place])    metastatic  . Prostate cancer ([place]) 12/11/2016  . Prostate cancer ([place]) 12/11/2016  . SSS (sick sinus syndrome) ([place])    a. s/p PPM 2007 with lead vegetation in 2015 s/p extraction of pacemaker leads.    Allergies Allergies  Allergen Reactions  . Lisinopril Other (See Comments)    REACTION:  Elevated potassium,renal insuf    IV Location/Drains/Wounds Patient Lines/Drains/Airways Status   Active Line/Drains/Airways    Name:   Placement date:   Placement time:   Site:   Days:   Peripheral IV 04/13/18 Right;Left Forearm   04/13/18    2351  Forearm   54   Peripheral IV 06/06/18 Anterior;Proximal;Right Antecubital   06/06/18    1501    Antecubital   less than 1   External Urinary Catheter   02/27/12    -    -   2291   Incision (Closed) 04/30/13 Groin Right   04/30/13    2048     1863   Incision (Closed) 05/01/13 Thigh Left   05/01/13    0814     1862   Incision (Closed) 05/01/13 Groin Left   05/01/13    0856     1862   Incision (Closed) 05/05/13 Foot Left   05/05/13    1654     1858   Incision (Closed) 10/27/13 Groin Right   10/27/13    0909     1683   Incision (Closed) 10/27/13 Groin Left   10/27/13    1050      1683   Incision (Closed) 10/27/13 Chest Right   10/27/13    1050     1683   Wound / Incision (Open or Dehisced) 05/02/13 Other (Comment) Left necrotic left great toe with skin open and foul odor.   05/02/13    2000    Other (Comment)   1861          Labs/Imaging Results for orders placed or performed during the hospital encounter of 06/06/18 (from the past 48 hour(s))  SARS Coronavirus 2 (CEPHEID - Performed in [facility] hospital lab), Hosp Order     Status: None   Collection Time: 06/06/18  1:02 PM  Result Value Ref Range   SARS Coronavirus 2 NEGATIVE NEGATIVE    Comment: (NOTE) If result is NEGATIVE SARS-CoV-2 target nucleic acids are NOT DETECTED. The SARS-CoV-2 RNA is generally detectable in upper and lower  respiratory specimens during the acute phase of infection. The lowest  concentration of SARS-CoV-2 viral copies this assay can detect is 250  copies / mL. A negative result does not preclude SARS-CoV-2 infection  and should not be used as the sole basis for treatment or other  patient management decisions.  A negative result may occur with  improper specimen collection / handling, submission of specimen other  than nasopharyngeal swab, presence of viral mutation(s) within the  areas targeted by this assay, and inadequate number of viral copies  (<250 copies / mL). A negative result must be combined with clinical  observations, patient history, and epidemiological information. If result is POSITIVE SARS-CoV-2 target nucleic acids are DETECTED. The SARS-CoV-2 RNA is generally detectable in upper and lower  respiratory specimens dur ing the acute phase of infection.  Positive  results are indicative of active infection with SARS-CoV-2.  Clinical  correlation with patient history and other diagnostic information is  necessary to determine patient infection status.  Positive results do  not rule out bacterial infection or co-infection with other viruses. If result is  PRESUMPTIVE POSTIVE SARS-CoV-2 nucleic acids MAY BE PRESENT.   A presumptive positive result was obtained on the submitted specimen  and confirmed on repeat testing.  While 2019 novel coronavirus  (SARS-CoV-2) nucleic acids may be present in the submitted sample  additional confirmatory testing may be necessary for epidemiological  and / or clinical management purposes  to differentiate between  SARS-CoV-2 and other Sarbecovirus currently known to infect humans.  If clinically indicated additional testing with an alternate test  methodology [phone]) is advised. The SARS-CoV-2 RNA is generally  detectable in upper and lower respiratory  sp ecimens during the acute  phase of infection. The expected result is Negative. Fact Sheet for Patients:  StrictlyIdeas.no Fact Sheet for Healthcare Providers: BankingDealers.co.za This test is not yet approved or cleared by the [place] FDA and has been authorized for detection and/or diagnosis of SARS-CoV-2 by FDA under an Emergency Use Authorization (EUA).  This EUA will remain in effect (meaning this test can be used) for the duration of the COVID-19 declaration under Section 564(b)(1) of the Act, 21 U.S.C. section 360bbb-3(b)(1), unless the authorization is terminated or revoked sooner. Performed at [facility], [place] [address], [place], [place] [address]   CBC with Differential     Status: Abnormal   Collection Time: 06/06/18  2:31 PM  Result Value Ref Range   WBC 4.0 4.0 - 10.5 K/uL   RBC 4.34 4.22 - 5.81 MIL/uL   Hemoglobin 11.3 (L) 13.0 - 17.0 g/dL   HCT 37.4 (L) 39.0 - 52.0 %   MCV 86.2 80.0 - 100.0 fL   MCH 26.0 26.0 - 34.0 pg   MCHC 30.2 30.0 - 36.0 g/dL   RDW 21.9 (H) 11.5 - 15.5 %   Platelets 145 (L) 150 - 400 K/uL   nRBC 0.0 0.0 - 0.2 %   Neutrophils Relative % 73 %   Neutro Abs 2.9 1.7 - 7.7 K/uL   Lymphocytes Relative 18 %   Lymphs Abs 0.7 0.7 - 4.0 K/uL    Monocytes Relative 8 %   Monocytes Absolute 0.3 0.1 - 1.0 K/uL   Eosinophils Relative 0 %   Eosinophils Absolute 0.0 0.0 - 0.5 K/uL   Basophils Relative 1 %   Basophils Absolute 0.0 0.0 - 0.1 K/uL   WBC Morphology MORPHOLOGY UNREMARKABLE    Immature Granulocytes 0 %   Abs Immature Granulocytes 0.01 0.00 - 0.07 K/uL    Comment: Performed at [facility], [place] [address] [place], [place] [address]  Comprehensive metabolic panel     Status: Abnormal   Collection Time: 06/06/18  2:31 PM  Result Value Ref Range   Sodium 140 135 - 145 mmol/L   Potassium 4.9 3.5 - 5.1 mmol/L   Chloride 107 98 - 111 mmol/L   CO2 19 (L) 22 - 32 mmol/L   Glucose, Bld 120 (H) 70 - 99 mg/dL   BUN 151 (H) 8 - 23 mg/dL    Comment: RESULTS CONFIRMED BY MANUAL DILUTION   Creatinine, Ser 7.71 (H) 0.61 - 1.24 mg/dL   Calcium 8.3 (L) 8.9 - 10.3 mg/dL   Total Protein 7.2 6.5 - 8.1 g/dL   Albumin 3.2 (L) 3.5 - 5.0 g/dL   AST 18 15 - 41 U/L   ALT 15 0 - 44 U/L   Alkaline Phosphatase 57 38 - 126 U/L   Total Bilirubin 0.6 0.3 - 1.2 mg/dL   GFR calc non Af Amer 6 (L) >60 mL/min   GFR calc Af Amer 7 (L) >60 mL/min   Anion gap 14 5 - 15    Comment: Performed at [facility], [place] [address] [place], [place] [address]   Ct Renal Stone Study  Result Date: 06/06/2018 CLINICAL DATA:  Acute renal failure with hematuria. EXAM: CT ABDOMEN AND PELVIS WITHOUT CONTRAST TECHNIQUE: Multidetector CT imaging of the abdomen and pelvis was performed following the standard protocol without IV contrast. COMPARISON:  CT abdomen pelvis dated 02/29/2016 FINDINGS: Lower chest: There are moderate bilateral pleural effusions, right greater than left. There is significant  cardiomegaly and advanced coronary artery calcifications. The intracardiac blood pool is hypodense relative to the adjacent myocardium consistent with anemia. Hepatobiliary: No focal liver abnormality is seen. Status post cholecystectomy. No  biliary dilatation. Pancreas: Unremarkable. No pancreatic ductal dilatation or surrounding inflammatory changes. Spleen: Normal in size without focal abnormality. Adrenals/Urinary Tract: The kidneys are somewhat atrophic bilaterally. There is no hydronephrosis. There are no obstructing radiopaque nephroliths. Vascular calcifications are noted of both renal arteries. Stomach/Bowel: Stomach is within normal limits. Appendix appears normal. No evidence of bowel wall thickening, distention, or inflammatory changes. Vascular/Lymphatic: Aortic atherosclerosis. No enlarged abdominal or pelvic lymph nodes. 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 There is likely moderate to high-grade stenosis of the origin of the SMA secondary to advanced atherosclerotic calcifications. Bilateral lower extremity bypass grafts are noted. Reproductive: Prostate is unremarkable. Other: Anasarca is noted. There is a small volume of abdominal ascites. Musculoskeletal: Stable lytic lesions are noted in the thoracolumbar spine. Multilevel degenerative changes are again seen. IMPRESSION: 1. No hydronephrosis. No radiopaque obstructing nephroliths. The kidneys are somewhat atrophic which can be seen in patients with end-stage renal disease. 2. Cardiomegaly.  Moderate to large bilateral pleural effusions. 3. Anasarca.  There is a small volume of abdominal ascites. 4. Advanced atherosclerotic changes of the abdominal aorta without evidence of an aneurysm. 5. Duplicated IVC, a normal variant. Electronically Signed   By: Constance Holster M.D.   On: 06/06/2018 17:10    Pending Labs Unresulted Labs (From admission, onward)    Start     Ordered   06/06/18 1224  Urinalysis, Routine w reflex microscopic  ONCE - STAT,   STAT     06/06/18 1224   Signed and Held  CBC  (heparin)  Once,   R    Comments:  Baseline for heparin therapy IF NOT ALREADY DRAWN.  Notify MD if PLT < 100 K.    Signed and Held   Signed and Held  Creatinine, serum   (heparin)  Once,   R    Comments:  Baseline for heparin therapy IF NOT ALREADY DRAWN.    Signed and Held   Signed and Held  Comprehensive metabolic panel  Tomorrow morning,   R     Signed and Held   Signed and Held  CBC  Tomorrow morning,   R     Signed and Held   Signed and Held  Hemoglobin A1c  Once,   R     Signed and Held          Vitals/Pain Today's Vitals   06/06/18 1700 06/06/18 1730 06/06/18 1800 06/06/18 1815  BP: (!) 142/69 129/68 135/70   Pulse:    71  Temp:      TempSrc:      SpO2:    99%    Isolation Precautions No active isolations  Medications Medications  sodium chloride 0.9 % bolus 1,000 mL (1,000 mLs Intravenous New Bag/Given 06/06/18 1503)    Mobility walks with device (per patient) \

## 2018-06-06 NOTE — ED Notes (Signed)
Report called to receiving RN.

## 2018-06-06 NOTE — ED Provider Notes (Signed)
Accepted from Dr. Tyrone Nine to follow-up on remainder diagnostic studies with planned admission for acute on chronic renal failure.  Patient is alert and in no distress.  He is denying any pain.  He is not having respiratory distress at rest.  Patient reports however he really does not have any memory of a lot of things and he cannot tell me any historical lead up to the visit to the emergency department. Physical Exam  BP (!) 137/95   Pulse (!) 50   Temp 98.1 F (36.7 C) (Oral)   SpO2 100%   Physical Exam Patient is alert and responds well to verbal interaction. No respiratory distress at rest. Anasarca. ED Course/Procedures     Procedures  MDM  I had an extensive discussion with the patient's daughter, Luanna Salk, on the telephone.  She is aware that there was a palliative care consult at his last visit.  She also agrees that they had discussed the patient not being a candidate for dialysis.  She reports many years ago he did not want to be on dialysis and so the do not think they would proceed with that now.  She reports she has discussed this with the patient's wife, her mother.  They would like to try treatment in the hospital to see if he could improve somewhat.  She reports for anything interventional they would only want that if it seemed like it was going to be helpful and he was strong enough to sustain it.  He advises that she is a first contacted for medical decision-making, her brother who is also Uchenna Rappaport is listed in the contacts (updated by me) as well as a second contact if she cannot be contacted.       Charlesetta Shanks, MD 06/06/18 [phone]

## 2018-06-06 NOTE — ED Triage Notes (Signed)
Patient is from [facility] and came via EMS due to abnormally high creatinine levels. The facility is concerned he may be going into kidney failure. EMS reports that the patient may possibly be confused.    EMS vitals: 122/64 BP 54 HR 94% O2 sats on room air 97.7 temp

## 2018-06-07 DIAGNOSIS — J90 Pleural effusion, not elsewhere classified: Secondary | ICD-10-CM

## 2018-06-07 DIAGNOSIS — Z7189 Other specified counseling: Secondary | ICD-10-CM

## 2018-06-07 DIAGNOSIS — Z515 Encounter for palliative care: Secondary | ICD-10-CM

## 2018-06-07 DIAGNOSIS — L8990 Pressure ulcer of unspecified site, unspecified stage: Secondary | ICD-10-CM

## 2018-06-07 DIAGNOSIS — N179 Acute kidney failure, unspecified: Principal | ICD-10-CM

## 2018-06-07 LAB — CBC
HCT: 36.1 % — ABNORMAL LOW (ref 39.0–52.0)
Hemoglobin: 11.3 g/dL — ABNORMAL LOW (ref 13.0–17.0)
MCH: 26 pg (ref 26.0–34.0)
MCHC: 31.3 g/dL (ref 30.0–36.0)
MCV: 83.2 fL (ref 80.0–100.0)
Platelets: 94 10*3/uL — ABNORMAL LOW (ref 150–400)
RBC: 4.34 MIL/uL (ref 4.22–5.81)
RDW: 21.6 % — ABNORMAL HIGH (ref 11.5–15.5)
WBC: 3.8 10*3/uL — ABNORMAL LOW (ref 4.0–10.5)
nRBC: 0 % (ref 0.0–0.2)

## 2018-06-07 LAB — COMPREHENSIVE METABOLIC PANEL
ALT: 15 U/L (ref 0–44)
AST: 18 U/L (ref 15–41)
Albumin: 2.7 g/dL — ABNORMAL LOW (ref 3.5–5.0)
Alkaline Phosphatase: 45 U/L (ref 38–126)
Anion gap: 15 (ref 5–15)
BUN: 143 mg/dL — ABNORMAL HIGH (ref 8–23)
CO2: 15 mmol/L — ABNORMAL LOW (ref 22–32)
Calcium: 7.9 mg/dL — ABNORMAL LOW (ref 8.9–10.3)
Chloride: 109 mmol/L (ref 98–111)
Creatinine, Ser: 7.22 mg/dL — ABNORMAL HIGH (ref 0.61–1.24)
GFR calc Af Amer: 7 mL/min — ABNORMAL LOW (ref >60)
GFR calc non Af Amer: 6 mL/min — ABNORMAL LOW (ref >60)
Glucose, Bld: 80 mg/dL (ref 70–99)
Potassium: 4.6 mmol/L (ref 3.5–5.1)
Sodium: 139 mmol/L (ref 135–145)
Total Bilirubin: 0.6 mg/dL (ref 0.3–1.2)
Total Protein: 6.1 g/dL — ABNORMAL LOW (ref 6.5–8.1)

## 2018-06-07 LAB — HEMOGLOBIN A1C
Hgb A1c MFr Bld: 6.1 % — ABNORMAL HIGH (ref 4.8–5.6)
Mean Plasma Glucose: 128.37 mg/dL

## 2018-06-07 LAB — GLUCOSE, CAPILLARY
Glucose-Capillary: 61 mg/dL — ABNORMAL LOW (ref 70–99)
Glucose-Capillary: 71 mg/dL (ref 70–99)
Glucose-Capillary: 108 mg/dL — ABNORMAL HIGH (ref 70–99)
Glucose-Capillary: 108 mg/dL — ABNORMAL HIGH (ref 70–99)

## 2018-06-07 MED ORDER — SENNOSIDES-DOCUSATE SODIUM 8.6-50 MG PO TABS: 1.0000 | ORAL_TABLET | Freq: Every evening | ORAL | Status: DC | PRN

## 2018-06-07 MED ORDER — PRO-STAT SUGAR FREE PO LIQD
30.0000 mL | Freq: Two times a day (BID) | ORAL | Status: DC
  Administered 2018-06-07 – 2018-06-08 (×2): 30 mL via ORAL
  Filled 2018-06-07 (×3): qty 30

## 2018-06-07 MED ORDER — FUROSEMIDE 40 MG PO TABS
40.0000 mg | ORAL_TABLET | Freq: Two times a day (BID) | ORAL | Status: DC
  Administered 2018-06-08: 40 mg via ORAL
  Filled 2018-06-07 (×2): qty 1

## 2018-06-07 MED ORDER — GLUCERNA SHAKE PO LIQD
237.0000 mL | Freq: Two times a day (BID) | ORAL | Status: DC
  Administered 2018-06-07 – 2018-06-08 (×3): 237 mL via ORAL
  Filled 2018-06-07 (×4): qty 237

## 2018-06-07 MED ORDER — INSULIN ASPART 100 UNIT/ML ~~LOC~~ SOLN: 0.0000 [IU] | Freq: Three times a day (TID) | SUBCUTANEOUS | Status: DC

## 2018-06-07 NOTE — Progress Notes (Signed)
PROGRESS NOTE    Colin [name]  JQG:[id number] DOB: February 14, 1928 DOA: 06/06/2018 PCP: Deland Pretty, MD   Brief Narrative:  Colin [name] is a 85 y.o. 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  Reportedly, patient did not have any complaint.  Elevated creatinine was confirmed in ED.Colin [name]  Hospital service was consulted to admit the patient.  Patient has dementia.  He is alert and only oriented to place.  He is pleasant.  This is his baseline.  Of note, he was recently hospitalized about 2 months ago here for acute on chronic kidney disease as well as UTI and slightly elevated troponin which were thought to be secondary to worsening renal disease.  According to note, he was also seen by nephrology but he was deemed not a good candidate for HD so palliative care was recommended however after talking to his daughter, it is not known whether he ever saw palliative care at the nursing home.  Daughter has no information about that since she has not been able to see the patient for 2 months physically due to COVID-19 situation but at this point in time she wanted him to be full code.  ED Course:  Upon arrival to the emergency department, patient's vitals were stable.  Repeat blood work-up was done which showed significantly elevated creatinine.  His baseline creatinine just about 2 months ago was around 4.5 and today it was 7.71.  Normal WBC.  Normal vitals.  He also underwent CT renal stone which ruled out any obstructive uropathy but it showed cardiomegaly with moderate to large  bilateral pleural effusions.  Anasarca and advanced atherosclerotic changes of the abdominal aorta without evidence of an aneurysm.  Consultants:   None  Procedures:   None  Antimicrobials:   None   Subjective: Patient seen and examined.  Again he has no complaint.  Denied any shortness of breath.  Looks comfortable.  Objective: Vitals:   06/06/18 2011 06/07/18 0000 06/07/18 0540 06/07/18 0600  BP: 125/81  102/73   Pulse: 87  94   Resp: (!) 24 20 (!) 24   Temp: 97.6 F (36.4 C)  97.7 F (36.5 C)   TempSrc: Oral  Oral   SpO2: 98%  100%   Weight: 78.1 kg   78.9 kg  Height: 5\' 9"  (1.753 m)       Intake/Output Summary (Last 24 hours) at 06/07/2018 1055 Last data filed at 06/07/2018 0911 Gross per 24 hour  Intake 1553.38 ml  Output 200 ml  Net 1353.38 ml   Filed Weights   06/06/18 2011 06/07/18 0600  Weight: 78.1 kg 78.9 kg    Examination:  General exam: Appears calm and comfortable  Respiratory system: Clear to auscultation with diminished breath sounds at the bases bilaterally. Respiratory effort normal. Cardiovascular system: S1 & S2 heard, RRR. No JVD, murmurs, rubs, gallops or clicks.  2+ pitting edema bilateral lower extremity Gastrointestinal system: Abdomen is nondistended, soft and nontender. No organomegaly or masses felt. Normal bowel sounds heard. Central nervous system: Alert and oriented. No focal neurological deficits. Extremities: Symmetric 5 x 5 power. Skin: No rashes, lesions or ulcers  Psychiatry: Judgement and insight appear normal. Mood & affect appropriate.    Data Reviewed: I have personally reviewed following labs and imaging studies  CBC: Recent Labs  Lab 06/06/18 1431 06/07/18 0536  WBC 4.0 3.8*  NEUTROABS 2.9  --   HGB 11.3* 11.3*  HCT 37.4* 36.1*  MCV 86.2 83.2  PLT 145* 94*   Basic Metabolic Panel: Recent Labs  Lab 06/06/18 1431 06/07/18 0536  NA 140 139  K 4.9 4.6  CL 107 109  CO2 19* 15*  GLUCOSE 120* 80  BUN 151*  143*  CREATININE 7.71* 7.22*  CALCIUM 8.3* 7.9*   GFR: Estimated Creatinine Clearance: 7.2 mL/min (A) (by C-G formula based on SCr of 7.22 mg/dL (H)). Liver Function Tests: Recent Labs  Lab 06/06/18 1431 06/07/18 0536  AST 18 18  ALT 15 15  ALKPHOS 57 45  BILITOT 0.6 0.6  PROT 7.2 6.1*  ALBUMIN 3.2* 2.7*   No results for input(s): LIPASE, AMYLASE in the last 168 hours. No results for input(s): AMMONIA in the last 168 hours. Coagulation Profile: No results for input(s): INR, PROTIME in the last 168 hours. Cardiac Enzymes: No results for input(s): CKTOTAL, CKMB, CKMBINDEX, TROPONINI in the last 168 hours. BNP (last 3 results) No results for input(s): PROBNP in the last 8760 hours. HbA1C: Recent Labs    06/07/18 0536  HGBA1C 6.1*   CBG: Recent Labs  Lab 06/06/18 2151 06/07/18 0748 06/07/18 0805  GLUCAP 119* 61* 71   Lipid Profile: No results for input(s): CHOL, HDL, LDLCALC, TRIG, CHOLHDL, LDLDIRECT in the last 72 hours. Thyroid Function Tests: No results for input(s): TSH, T4TOTAL, FREET4, T3FREE, THYROIDAB in the last 72 hours. Anemia Panel: No results for input(s): VITAMINB12, FOLATE, FERRITIN, TIBC, IRON, RETICCTPCT in the last 72 hours. Sepsis Labs: No results for input(s): PROCALCITON, LATICACIDVEN in the last 168 hours.  Recent Results (from the past 240 hour(s))  SARS Coronavirus 2 (CEPHEID - Performed in [facility] hospital lab), Hosp Order     Status: None   Collection Time: 06/06/18  1:02 PM  Result Value Ref Range Status   SARS Coronavirus 2 NEGATIVE NEGATIVE Final    Comment: (NOTE) If result is NEGATIVE SARS-CoV-2 target nucleic acids are NOT DETECTED. The SARS-CoV-2 RNA is generally detectable in upper and lower  respiratory specimens during the acute phase of infection. The lowest  concentration of SARS-CoV-2 viral copies this assay can detect is 250  copies / mL. A negative result does not preclude SARS-CoV-2 infection  and should not be  used as the sole basis for treatment or other  patient management decisions.  A negative result may occur with  improper specimen collection / handling, submission of specimen other  than nasopharyngeal swab, presence of viral mutation(s) within the  areas targeted by this assay, and inadequate number of viral copies  (<250 copies / mL). A negative result must be combined with clinical  observations, patient history, and epidemiological information. If result is POSITIVE SARS-CoV-2 target nucleic acids are DETECTED. The SARS-CoV-2 RNA is generally detectable in upper and lower  respiratory specimens dur ing the acute phase of infection.  Positive  results are indicative of active infection with SARS-CoV-2.  Clinical  correlation with patient history and other diagnostic information is  necessary to determine patient infection status.  Positive results do  not rule out bacterial infection or co-infection with other viruses. If result is PRESUMPTIVE POSTIVE SARS-CoV-2 nucleic acids MAY BE PRESENT.   A presumptive positive  result was obtained on the submitted specimen  and confirmed on repeat testing.  While 2019 novel coronavirus  (SARS-CoV-2) nucleic acids may be present in the submitted sample  additional confirmatory testing may be necessary for epidemiological  and / or clinical management purposes  to differentiate between  SARS-CoV-2 and other Sarbecovirus currently known to infect humans.  If clinically indicated additional testing with an alternate test  methodology [phone]) is advised. The SARS-CoV-2 RNA is generally  detectable in upper and lower respiratory sp ecimens during the acute  phase of infection. The expected result is Negative. Fact Sheet for Patients:  StrictlyIdeas.no Fact Sheet for Healthcare Providers: BankingDealers.co.za This test is not yet approved or cleared by the [place] FDA and has been authorized  for detection and/or diagnosis of SARS-CoV-2 by FDA under an Emergency Use Authorization (EUA).  This EUA will remain in effect (meaning this test can be used) for the duration of the COVID-19 declaration under Section 564(b)(1) of the Act, 21 U.S.C. section 360bbb-3(b)(1), unless the authorization is terminated or revoked sooner. Performed at [facility], [place] [address], [place], [place] [address]   MRSA PCR Screening     Status: None   Collection Time: 06/06/18  6:31 PM  Result Value Ref Range Status   MRSA by PCR NEGATIVE NEGATIVE Final    Comment:        The GeneXpert MRSA Assay (FDA approved for NASAL specimens only), is one component of a comprehensive MRSA colonization surveillance program. It is not intended to diagnose MRSA infection nor to guide or monitor treatment for MRSA infections. Performed at [facility], [place] [address], [place], [place] [address]       Radiology Studies: Dg Chest 2 View  Result Date: 06/06/2018 CLINICAL DATA:  History of COPD.  Renal failure. EXAM: CHEST - 2 VIEW COMPARISON:  PA and lateral chest 04/14/2018 and 09/17/2015. FINDINGS: The patient has moderate to moderately large pleural effusions, greater on the right, with associated compressive atelectasis. Eventration left hemidiaphragm again seen. There is cardiomegaly and vascular congestion. No pneumothorax. No acute bony abnormality. IMPRESSION: Moderate to moderately large pleural effusions, greater on the right. Cardiomegaly and pulmonary vascular congestion. Electronically Signed   By: Inge Rise M.D.   On: 06/06/2018 21:43   Ct Renal Stone Study  Result Date: 06/06/2018 CLINICAL DATA:  Acute renal failure with hematuria. EXAM: CT ABDOMEN AND PELVIS WITHOUT CONTRAST TECHNIQUE: Multidetector CT imaging of the abdomen and pelvis was performed following the standard protocol without IV contrast. COMPARISON:  CT abdomen pelvis dated 02/29/2016  FINDINGS: Lower chest: There are moderate bilateral pleural effusions, right greater than left. There is significant cardiomegaly and advanced coronary artery calcifications. The intracardiac blood pool is hypodense relative to the adjacent myocardium consistent with anemia. Hepatobiliary: No focal liver abnormality is seen. Status post cholecystectomy. No biliary dilatation. Pancreas: Unremarkable. No pancreatic ductal dilatation or surrounding inflammatory changes. Spleen: Normal in size without focal abnormality. Adrenals/Urinary Tract: The kidneys are somewhat atrophic bilaterally. There is no hydronephrosis. There are no obstructing radiopaque nephroliths. Vascular calcifications are noted of both renal arteries. Stomach/Bowel: Stomach is within normal limits. Appendix appears normal. No evidence of bowel wall thickening, distention, or inflammatory changes. Vascular/Lymphatic: Aortic atherosclerosis. No enlarged abdominal or pelvic lymph nodes. 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 There is likely moderate to high-grade stenosis of the origin of the SMA secondary to advanced atherosclerotic calcifications. Bilateral lower extremity bypass grafts are noted. Reproductive: Prostate  is unremarkable. Other: Anasarca is noted. There is a small volume of abdominal ascites. Musculoskeletal: Stable lytic lesions are noted in the thoracolumbar spine. Multilevel degenerative changes are again seen. IMPRESSION: 1. No hydronephrosis. No radiopaque obstructing nephroliths. The kidneys are somewhat atrophic which can be seen in patients with end-stage renal disease. 2. Cardiomegaly.  Moderate to large bilateral pleural effusions. 3. Anasarca.  There is a small volume of abdominal ascites. 4. Advanced atherosclerotic changes of the abdominal aorta without evidence of an aneurysm. 5. Duplicated IVC, a normal variant. Electronically Signed   By: Constance Holster M.D.   On: 06/06/2018 17:10    Scheduled Meds:   amLODipine  2.5 mg Oral Daily   ezetimibe  10 mg Oral Daily   feeding supplement (GLUCERNA SHAKE)  237 mL Oral BID BM   feeding supplement (PRO-STAT SUGAR FREE 64)  30 mL Oral BID   ferrous sulfate  325 mg Oral Q breakfast   heparin  5,000 Units Subcutaneous Q8H   hydrALAZINE  10 mg Oral Q8H   insulin aspart  0-5 Units Subcutaneous QHS   insulin aspart  0-9 Units Subcutaneous TID WC   isosorbide mononitrate  30 mg Oral Daily   sodium chloride flush  3 mL Intravenous Q12H   vitamin B-12  1,000 mcg Oral Daily   Continuous Infusions:  sodium chloride 125 mL/hr at 06/07/18 0611     LOS: 1 day   Assessment & Plan:   Active Problems:   Renal failure (ARF), acute on chronic (HCC)  Acute on chronic kidney disease stage IV: Baseline creatinine 4.5 just about 2 months ago, came in with 7.71.  Creatinine has improved slightly.  Reduced urine output, could very well be due to dehydration.  Stop IV fluids due to bilateral pleural effusion.  Will consult nephrology today  Type 2 diabetes mellitus:  Some hypoglycemia this morning.  Stop Lantus.  Reduce sliding scale insulin to sensitive scale.  Hypertension: Controlled.  Resume all home medications.  History of COPD: Stable resume breathing treatments.  Moderate to large bilateral pleural effusion:  More on the right than the left.  Due to worsening renal function.  He is not dyspneic and not hypoxic.  Stop IV fluids  DVT prophylaxis: Heparin Code Status: Full code Family Communication: Discussed with patient's daughter yesterday. Disposition Plan: To be determined.   Time spent: 30 min    Darliss Cheney, MD [facility] Pager [phone]  If 7PM-7AM, please contact night-coverage [email or web address] Password [facility] 06/07/2018, 10:55 AM

## 2018-06-07 NOTE — Progress Notes (Signed)
Inpatient Diabetes Program Recommendations  AACE/ADA: New Consensus Statement on Inpatient Glycemic Control (2015)  Target Ranges:  Prepandial:   less than 140 mg/dL      Peak postprandial:   less than 180 mg/dL (1-2 hours)      Critically ill patients:  140 - 180 mg/dL   Lab Results  Component Value Date   GLUCAP 71 06/07/2018   HGBA1C 6.1 (H) 06/07/2018    Review of Glycemic Control  Diabetes history: DM2 Outpatient Diabetes medications: Lantus 4 units QHS, Novolog 0-9 units tidwc Current orders for Inpatient glycemic control: Lantus 4 units QHS, Novolog 0-15 units tidwc and hs  Hypoglycemia of 61 this am.  Inpatient Diabetes Program Recommendations:     Reduce Lantus to 2 units QHS Reduce Novolog to 0-9 units tid  Secure text to MD regarding recs.  Thank you. Lorenda Peck, RD, LDN, CDE Inpatient Diabetes Coordinator [phone]

## 2018-06-07 NOTE — Consult Note (Signed)
[facility] Renal Consultation Note  Requesting MD: Pahwani Indication for Consultation: A on CRF  HPI:  Colin [name] is a 85 y.o. male NH resident with reported dementia, HTN, DM, COPD, hx CVA, CAD s/p CABG in past, PAD s/p bypass and metastatic prostate CA.  He has known stage 4 CKD, crt has been in the 3s and 4's over the last 6 mos- has reportedly seen Dr. Justin Mend at [facility] in the past but details are unknown regarding those interactions at this time- was able to find out  has not been seen since 2018.  He was sent to hospital from [facility] for abnormal creatinine at over 7 (was 4.5 in March) - gfr change from 13 to 7 so no major change.  Has had renal imaging here- no hydro- just small kidneys - urine looks like UTI- 100 of protein.  He is only oriented to self, denies pain but then reacts upon palpation of his abdomen.  He seems volume overloaded.  Only 200 of UOP recorded since admission  Creatinine  Date/Time Value Ref Range Status  02/13/2018 03:15 PM 4.40 (HH) 0.61 - 1.24 mg/dL Final    Comment:    CRITICAL RESULT CALLED TO, READ BACK BY AND VERIFIED WITH: JASMINE RICHARDSON,RN   11/06/2017 02:24 PM 3.25 (HH) 0.61 - 1.24 mg/dL Final    Comment:    CRITICAL RESULT CALLED TO, READ BACK BY AND VERIFIED WITH: EMILLE G,RN   06/19/2017 12:05 PM 2.80 (H) 0.70 - 1.30 mg/dL Final  12/11/2016 02:57 PM 2.5 (H) 0.7 - 1.3 mg/dL Final   Creatinine, Ser  Date/Time Value Ref Range Status  06/07/2018 05:36 AM 7.22 (H) 0.61 - 1.24 mg/dL Final  06/06/2018 02:31 PM 7.71 (H) 0.61 - 1.24 mg/dL Final  04/17/2018 04:54 AM 4.54 (H) 0.61 - 1.24 mg/dL Final  04/16/2018 04:20 AM 4.51 (H) 0.61 - 1.24 mg/dL Final  04/14/2018 01:01 AM 4.18 (H) 0.61 - 1.24 mg/dL Final  03/05/2017 10:53 AM 2.53 (H) 0.70 - 1.30 mg/dL Final  08/03/2016 10:20 AM 2.11 (H) 0.76 - 1.27 mg/dL Final  02/29/2016 05:00 PM 2.23 (H) 0.61 - 1.24 mg/dL Final  10/31/2013 04:38 AM 2.16 (H) 0.50 - 1.35 mg/dL Final  10/29/2013 05:50 AM  2.06 (H) 0.50 - 1.35 mg/dL Final  10/27/2013 03:35 PM 2.04 (H) 0.50 - 1.35 mg/dL Final  10/27/2013 03:10 AM 2.04 (H) 0.50 - 1.35 mg/dL Final  10/26/2013 05:05 AM 2.06 (H) 0.50 - 1.35 mg/dL Final  10/23/2013 07:45 AM 1.70 (H) 0.50 - 1.35 mg/dL Final  10/21/2013 05:54 AM 1.80 (H) 0.50 - 1.35 mg/dL Final  10/20/2013 05:32 AM 2.15 (H) 0.50 - 1.35 mg/dL Final  10/19/2013 12:00 PM 2.51 (H) 0.50 - 1.35 mg/dL Final  10/19/2013 06:51 AM 2.75 (H) 0.50 - 1.35 mg/dL Final  05/08/2013 02:15 AM 2.40 (H) 0.50 - 1.35 mg/dL Final  05/05/2013 06:40 AM 2.32 (H) 0.50 - 1.35 mg/dL Final  05/03/2013 04:36 AM 2.41 (H) 0.50 - 1.35 mg/dL Final  05/02/2013 12:57 AM 2.07 (H) 0.50 - 1.35 mg/dL Final  05/01/2013 04:16 PM 2.13 (H) 0.50 - 1.35 mg/dL Final  05/01/2013 03:49 AM 2.24 (H) 0.50 - 1.35 mg/dL Final  04/30/2013 09:00 PM 2.25 (H) 0.50 - 1.35 mg/dL Final  04/30/2013 09:54 AM 2.80 (H) 0.50 - 1.35 mg/dL Final  07/01/2012 06:22 PM 1.81 (H) 0.50 - 1.35 mg/dL Final  07/01/2012 02:30 PM 1.93 (H) 0.50 - 1.35 mg/dL Final  07/01/2012 10:31 AM 2.0 (H) 0.4 - 1.5 mg/dL  Final  02/29/2012 05:20 AM 2.22 (H) 0.50 - 1.35 mg/dL Final  02/28/2012 04:30 AM 2.71 (H) 0.50 - 1.35 mg/dL Final  02/27/2012 11:20 PM 2.85 (H) 0.50 - 1.35 mg/dL Final  02/27/2012 05:25 PM 3.02 (H) 0.50 - 1.35 mg/dL Final  01/11/2012 08:04 AM 1.95 (H) 0.50 - 1.35 mg/dL Final  01/10/2012 02:05 AM 2.27 (H) 0.50 - 1.35 mg/dL Final  01/09/2012 01:46 PM 2.52 (H) 0.50 - 1.35 mg/dL Final  10/26/2011 03:49 PM 1.98 (H) 0.50 - 1.35 mg/dL Final  07/12/2011 10:48 AM 2.3 (H) 0.4 - 1.5 mg/dL Final  06/09/2011 11:22 AM 2.4 (H) 0.4 - 1.5 mg/dL Final  04/10/2011 10:34 AM 1.9 (H) 0.4 - 1.5 mg/dL Final  05/13/2008 06:00 AM 2.32 (H) 0.4 - 1.5 mg/dL Final  05/12/2008 06:19 PM 3.21 (H) 0.4 - 1.5 mg/dL Final  05/14/2007 11:31 AM 1.71 (H)  Final  04/08/2007 04:45 AM 1.71 (H)  Final  04/07/2007 04:00 AM 1.77 (H)  Final  04/06/2007 03:20 AM 1.68 (H)  Final  04/04/2007 05:04  AM 1.61 (H)  Final  04/03/2007 05:40 AM 1.78 (H)  Final  04/02/2007 08:20 PM 1.90 (H)  Final  03/22/2007 05:53 PM 1.9 (H)  Final  02/16/2007 03:37 AM 1.78 (H)  Final  02/15/2007 03:45 AM 1.93 (H)  Final     PMHx:   Past Medical History:  Diagnosis Date  . Anemia 10/29/2013  . Arthritis   . Bradycardia   . Cardiomyopathy   . Carotid artery disease ([place])    a. Carotid US 2/17: R 50-69, L plaque.  . Chronic systolic CHF (congestive heart failure) ([place])   . CKD (chronic kidney disease), stage IV ([place])   . COPD (chronic obstructive pulmonary disease) ([place])   . Coronary artery disease    Status post CABG in 2004  . Dementia ([place])   . Diabetes mellitus with peripheral artery disease ([place])   . Dyslipidemia   . GERD (gastroesophageal reflux disease)   . History of anemia   . History of CVA (cerebrovascular accident)   . Hypertension   . Myocardial infarction ([place])   . Peripheral vascular disease ([place])    Status post right femoropopliteal bypass  . Prostate cancer ([place])    metastatic  . Prostate cancer ([place]) 12/11/2016  . Prostate cancer ([place]) 12/11/2016  . SSS (sick sinus syndrome) ([place])    a. s/p PPM 2007 with lead vegetation in 2015 s/p extraction of pacemaker leads.    Past Surgical History:  Procedure Laterality Date  . ABDOMINAL ANGIOGRAM  04/30/2013   Procedure: ABDOMINAL ANGIOGRAM;  Surgeon: Mal Misty, MD;  Location[facility] LAB;  Service: Cardiovascular;;  . AMPUTATION Left 05/05/2013   Procedure: AMPUTATION RAY- FIRST AND SECOND-LEFT FOOT;  Surgeon: Newt Minion, MD;  Location: [facility] [place];  Service: Orthopedics;  Laterality: Left;  . CARDIAC CATHETERIZATION  04/29/2002   Dr. Glade Lloyd  . CORONARY ANGIOPLASTY    . CORONARY ARTERY BYPASS GRAFT  4 of 2004   4 vessel bypass  . ESOPHAGOGASTRODUODENOSCOPY  07/21/2011   Procedure: ESOPHAGOGASTRODUODENOSCOPY (EGD);  Surgeon: Beryle Beams, MD;  Location: [facility];  Service: Endoscopy;  Laterality: N/A;  . FEMORAL BYPASS   july 2006   right with right great toe amputation  . FEMORAL-POPLITEAL BYPASS GRAFT Left 05/01/2013   Procedure: LEFT FEMORAL TO ABOVE KNEE POPLITEAL ARTERY BYPASS GRAFT WITH INTRAOPERATIVE ARTEROGRAM;  Surgeon: Mal Misty, MD;  Location: [place];  Service: Vascular;  Laterality: Left;  .  INSERT / REPLACE / REMOVE PACEMAKER  05/17/2005   st. jude dual chamber ddd mode   . LOWER EXTREMITY ANGIOGRAM N/A 04/30/2013   Procedure: LOWER EXTREMITY ANGIOGRAM;  Surgeon: Mal Misty, MD;  Location: [facility] CATH LAB;  Service: Cardiovascular;  Laterality: N/A;  . PACEMAKER LEAD REMOVAL Right 10/27/2013   Procedure: PACEMAKER LEAD REMOVAL;  Surgeon: Evans Lance, MD;  Location: [facility];  Service: Cardiovascular;  Laterality: Right;  Roxy Manns backing up  . TEE WITHOUT CARDIOVERSION N/A 10/23/2013   Procedure: TRANSESOPHAGEAL ECHOCARDIOGRAM (TEE);  Surgeon: Dorothy Spark, MD;  Location: [facility];  Service: Cardiovascular;  Laterality: N/A;  . TEE WITHOUT CARDIOVERSION N/A 10/28/2013   Procedure: TRANSESOPHAGEAL ECHOCARDIOGRAM (TEE);  Surgeon: Dorothy Spark, MD;  Location: [facility] ENDOSCOPY;  Service: Cardiovascular;  Laterality: N/A;    Family Hx:  Family History  Problem Relation Age of Onset  . Hyperlipidemia Mother   . Heart disease Father   . Diabetes Sister   . Hypertension Sister   . Hyperlipidemia Sister   . Diabetes Other     Social History:  reports that he quit smoking about 16 years ago. His smoking use included cigarettes. He has a 50.00 pack-year smoking history. He has never used smokeless tobacco. He reports that he does not drink alcohol or use drugs.  Allergies:  Allergies  Allergen Reactions  . Lisinopril Other (See Comments)    REACTION:  Elevated potassium,renal insuf    Medications: Prior to Admission medications   Medication Sig Start Date End Date Taking? Authorizing Provider  albuterol (PROVENTIL) (2.5 MG/3ML) 0.083% nebulizer solution Take 3 mLs (2.5 mg total) by  nebulization every 4 (four) hours as needed for wheezing or shortness of breath. 04/18/18  Yes Vann, Jessica U, DO  amLODipine (NORVASC) 2.5 MG tablet Take 2.5 mg daily by mouth. 10/25/16  Yes [provider]  ezetimibe (ZETIA) 10 MG tablet Take 1 tablet (10 mg total) by mouth daily. 11/17/15  Yes Nahser, Wonda Cheng, MD  ferrous sulfate 325 (65 FE) MG tablet Take 1 tablet (325 mg total) by mouth daily with breakfast. 10/31/13  Yes Delfina Redwood, MD  furosemide (LASIX) 40 MG tablet TAKE 1 TABLET BY MOUTH EVERY DAY Patient taking differently: Take 40 mg by mouth daily.  01/17/17  Yes Evans Lance, MD  hydrALAZINE (APRESOLINE) 10 MG tablet Take 1 tablet (10 mg total) by mouth every 8 (eight) hours. Please schedule an appt for future refills. 2nd attempt. 03/01/18  Yes Nahser, Wonda Cheng, MD  insulin aspart (NOVOLOG) 100 UNIT/ML injection Inject 0-9 Units into the skin 3 (three) times daily with meals. 04/18/18  Yes Vann, Jessica U, DO  insulin glargine (LANTUS) 100 UNIT/ML injection Inject 0.04 mLs (4 Units total) into the skin at bedtime. 04/18/18  Yes Eulogio Bear U, DO  isosorbide mononitrate (IMDUR) 30 MG 24 hr tablet Take 1 tablet (30 mg total) by mouth daily. ** DO NOT CRUSH **please make overdue appt with Dr. Acie Fredrickson before anymore refills. 3rd attempt 01/31/18  Yes Nahser, Wonda Cheng, MD  Omega-3 Fatty Acids (FISH OIL) 1000 MG CAPS Take 1,000 mg by mouth daily.    Yes [provider]  senna-docusate (SENOKOT-S) 8.6-50 MG tablet Take 1 tablet by mouth at bedtime as needed for mild constipation. 04/18/18  Yes Geradine Girt, DO  vitamin B-12 (CYANOCOBALAMIN) 1000 MCG tablet Take 1,000 mcg by mouth daily.   Yes [provider]    I have reviewed the patient's current  medications.  Labs:  Results for orders placed or performed during the hospital encounter of 06/06/18 (from the past 48 hour(s))  SARS Coronavirus 2 (CEPHEID - Performed in [facility] hospital lab), Hosp Order      Status: None   Collection Time: 06/06/18  1:02 PM  Result Value Ref Range   SARS Coronavirus 2 NEGATIVE NEGATIVE    Comment: (NOTE) If result is NEGATIVE SARS-CoV-2 target nucleic acids are NOT DETECTED. The SARS-CoV-2 RNA is generally detectable in upper and lower  respiratory specimens during the acute phase of infection. The lowest  concentration of SARS-CoV-2 viral copies this assay can detect is 250  copies / mL. A negative result does not preclude SARS-CoV-2 infection  and should not be used as the sole basis for treatment or other  patient management decisions.  A negative result may occur with  improper specimen collection / handling, submission of specimen other  than nasopharyngeal swab, presence of viral mutation(s) within the  areas targeted by this assay, and inadequate number of viral copies  (<250 copies / mL). A negative result must be combined with clinical  observations, patient history, and epidemiological information. If result is POSITIVE SARS-CoV-2 target nucleic acids are DETECTED. The SARS-CoV-2 RNA is generally detectable in upper and lower  respiratory specimens dur ing the acute phase of infection.  Positive  results are indicative of active infection with SARS-CoV-2.  Clinical  correlation with patient history and other diagnostic information is  necessary to determine patient infection status.  Positive results do  not rule out bacterial infection or co-infection with other viruses. If result is PRESUMPTIVE POSTIVE SARS-CoV-2 nucleic acids MAY BE PRESENT.   A presumptive positive result was obtained on the submitted specimen  and confirmed on repeat testing.  While 2019 novel coronavirus  (SARS-CoV-2) nucleic acids may be present in the submitted sample  additional confirmatory testing may be necessary for epidemiological  and / or clinical management purposes  to differentiate between  SARS-CoV-2 and other Sarbecovirus currently known to infect  humans.  If clinically indicated additional testing with an alternate test  methodology [phone]) is advised. The SARS-CoV-2 RNA is generally  detectable in upper and lower respiratory sp ecimens during the acute  phase of infection. The expected result is Negative. Fact Sheet for Patients:  StrictlyIdeas.no Fact Sheet for Healthcare Providers: BankingDealers.co.za This test is not yet approved or cleared by the [place] FDA and has been authorized for detection and/or diagnosis of SARS-CoV-2 by FDA under an Emergency Use Authorization (EUA).  This EUA will remain in effect (meaning this test can be used) for the duration of the COVID-19 declaration under Section 564(b)(1) of the Act, 21 U.S.C. section 360bbb-3(b)(1), unless the authorization is terminated or revoked sooner. Performed at [facility], [place] [address] [place], [place] [address]   CBC with Differential     Status: Abnormal   Collection Time: 06/06/18  2:31 PM  Result Value Ref Range   WBC 4.0 4.0 - 10.5 K/uL   RBC 4.34 4.22 - 5.81 MIL/uL   Hemoglobin 11.3 (L) 13.0 - 17.0 g/dL   HCT 37.4 (L) 39.0 - 52.0 %   MCV 86.2 80.0 - 100.0 fL   MCH 26.0 26.0 - 34.0 pg   MCHC 30.2 30.0 - 36.0 g/dL   RDW 21.9 (H) 11.5 - 15.5 %   Platelets 145 (L) 150 - 400 K/uL   nRBC 0.0 0.0 - 0.2 %   Neutrophils Relative % 73 %  Neutro Abs 2.9 1.7 - 7.7 K/uL   Lymphocytes Relative 18 %   Lymphs Abs 0.7 0.7 - 4.0 K/uL   Monocytes Relative 8 %   Monocytes Absolute 0.3 0.1 - 1.0 K/uL   Eosinophils Relative 0 %   Eosinophils Absolute 0.0 0.0 - 0.5 K/uL   Basophils Relative 1 %   Basophils Absolute 0.0 0.0 - 0.1 K/uL   WBC Morphology MORPHOLOGY UNREMARKABLE    Immature Granulocytes 0 %   Abs Immature Granulocytes 0.01 0.00 - 0.07 K/uL    Comment: Performed at [facility], [place] [address] [place], [place] [address]  Comprehensive metabolic panel      Status: Abnormal   Collection Time: 06/06/18  2:31 PM  Result Value Ref Range   Sodium 140 135 - 145 mmol/L   Potassium 4.9 3.5 - 5.1 mmol/L   Chloride 107 98 - 111 mmol/L   CO2 19 (L) 22 - 32 mmol/L   Glucose, Bld 120 (H) 70 - 99 mg/dL   BUN 151 (H) 8 - 23 mg/dL    Comment: RESULTS CONFIRMED BY MANUAL DILUTION   Creatinine, Ser 7.71 (H) 0.61 - 1.24 mg/dL   Calcium 8.3 (L) 8.9 - 10.3 mg/dL   Total Protein 7.2 6.5 - 8.1 g/dL   Albumin 3.2 (L) 3.5 - 5.0 g/dL   AST 18 15 - 41 U/L   ALT 15 0 - 44 U/L   Alkaline Phosphatase 57 38 - 126 U/L   Total Bilirubin 0.6 0.3 - 1.2 mg/dL   GFR calc non Af Amer 6 (L) >60 mL/min   GFR calc Af Amer 7 (L) >60 mL/min   Anion gap 14 5 - 15    Comment: Performed at [facility], [place] [address] [place], [place] [address]  MRSA PCR Screening     Status: None   Collection Time: 06/06/18  6:31 PM  Result Value Ref Range   MRSA by PCR NEGATIVE NEGATIVE    Comment:        The GeneXpert MRSA Assay (FDA approved for NASAL specimens only), is one component of a comprehensive MRSA colonization surveillance program. It is not intended to diagnose MRSA infection nor to guide or monitor treatment for MRSA infections. Performed at [facility], [place] [address] [place], [place] [address]   Urinalysis, Routine w reflex microscopic     Status: Abnormal   Collection Time: 06/06/18  8:25 PM  Result Value Ref Range   Color, Urine YELLOW YELLOW   APPearance CLOUDY (A) CLEAR   Specific Gravity, Urine 1.012 1.005 - 1.030   pH 5.0 5.0 - 8.0   Glucose, UA NEGATIVE NEGATIVE mg/dL   Hgb urine dipstick MODERATE (A) NEGATIVE   Bilirubin Urine NEGATIVE NEGATIVE   Ketones, ur NEGATIVE NEGATIVE mg/dL   Protein, ur 100 (A) NEGATIVE mg/dL   Nitrite NEGATIVE NEGATIVE   Leukocytes,Ua LARGE (A) NEGATIVE   RBC / HPF 6-10 0 - 5 RBC/hpf   WBC, UA >50 (H) 0 - 5 WBC/hpf   Bacteria, UA MANY (A) NONE SEEN   Squamous Epithelial / LPF 0-5 0  - 5   WBC Clumps PRESENT    Hyaline Casts, UA PRESENT     Comment: Performed at [facility], [place] [address], [place], [place] [address]  Glucose, capillary     Status: Abnormal   Collection Time: 06/06/18  9:51 PM  Result Value Ref Range   Glucose-Capillary 119 (H) 70 - 99  mg/dL  Comprehensive metabolic panel     Status: Abnormal   Collection Time: 06/07/18  5:36 AM  Result Value Ref Range   Sodium 139 135 - 145 mmol/L   Potassium 4.6 3.5 - 5.1 mmol/L   Chloride 109 98 - 111 mmol/L   CO2 15 (L) 22 - 32 mmol/L   Glucose, Bld 80 70 - 99 mg/dL   BUN 143 (H) 8 - 23 mg/dL    Comment: RESULTS CONFIRMED BY MANUAL DILUTION   Creatinine, Ser 7.22 (H) 0.61 - 1.24 mg/dL   Calcium 7.9 (L) 8.9 - 10.3 mg/dL   Total Protein 6.1 (L) 6.5 - 8.1 g/dL   Albumin 2.7 (L) 3.5 - 5.0 g/dL   AST 18 15 - 41 U/L   ALT 15 0 - 44 U/L   Alkaline Phosphatase 45 38 - 126 U/L   Total Bilirubin 0.6 0.3 - 1.2 mg/dL   GFR calc non Af Amer 6 (L) >60 mL/min   GFR calc Af Amer 7 (L) >60 mL/min   Anion gap 15 5 - 15    Comment: Performed at [facility], [place] [address], [place], [place] [address]  CBC     Status: Abnormal   Collection Time: 06/07/18  5:36 AM  Result Value Ref Range   WBC 3.8 (L) 4.0 - 10.5 K/uL   RBC 4.34 4.22 - 5.81 MIL/uL   Hemoglobin 11.3 (L) 13.0 - 17.0 g/dL   HCT 36.1 (L) 39.0 - 52.0 %   MCV 83.2 80.0 - 100.0 fL   MCH 26.0 26.0 - 34.0 pg   MCHC 31.3 30.0 - 36.0 g/dL   RDW 21.6 (H) 11.5 - 15.5 %   Platelets 94 (L) 150 - 400 K/uL    Comment: REPEATED TO VERIFY DELTA CHECK NOTED PLATELET COUNT CONFIRMED BY SMEAR SPECIMEN CHECKED FOR CLOTS    nRBC 0.0 0.0 - 0.2 %    Comment: Performed at [facility], [place] [address], [place], [place] [address]  Hemoglobin A1c     Status: Abnormal   Collection Time: 06/07/18  5:36 AM  Result Value Ref Range   Hgb A1c MFr Bld 6.1 (H) 4.8 - 5.6 %    Comment: (NOTE) Pre diabetes:           5.7%-6.4% Diabetes:              >6.4% Glycemic control for   <7.0% adults with diabetes    Mean Plasma Glucose 128.37 mg/dL    Comment: Performed at [place] [address] [place], [place] [address]  Glucose, capillary     Status: Abnormal   Collection Time: 06/07/18  7:48 AM  Result Value Ref Range   Glucose-Capillary 61 (L) 70 - 99 mg/dL  Glucose, capillary     Status: None   Collection Time: 06/07/18  8:05 AM  Result Value Ref Range   Glucose-Capillary 71 70 - 99 mg/dL  Glucose, capillary     Status: Abnormal   Collection Time: 06/07/18 11:56 AM  Result Value Ref Range   Glucose-Capillary 108 (H) 70 - 99 mg/dL     ROS:  Review of systems not obtained due to patient factors. Denied pain   Physical Exam: Vitals:   06/07/18 0540 06/07/18 1100  BP: 102/73   Pulse: 94   Resp: (!) 24   Temp: 97.7 F (36.5 C)   SpO2: 100% 99%     General: elderly BM- NAD-  One word  answers , some confabulation- not able to tell me where he is HEENT: PERRLA, mucous membranes moist  Neck: no obvious JVD Heart: RRR Lungs: poor effort, dec BS at the bases Abdomen: distended, diffusely tender  Extremities: pitting dependent edema  Skin: warm and dry  Neuro:  Oriented to self only, very weak   Assessment/Plan: 85 year old BM with multiple medical issues including CKD that has progressed with time- now very abnormal renal function  1.Renal- known CKD that has progressed with time.  Right now - GFR less than 10 and with BUN close to 150.  Renal ultrasound does not show obstruction.  U/A possibly shows UTI- will send for culture.  In any event I dont think there will be significant reversibility to renal function.  In addition, given his age and clinical status I do not think that introducing something like chronic dialysis would enhance his quality of life in any way in fact would do the opposite, therefore in my opinion he is not a candidate for dialysis.   2. Hypertension/volume  -  he is volume overloaded.  I will stop his low dose norvasc and hydralazine and attempt to treat with oral lasix- 40 BID 3. Anemia  - not significant at this point requiring treatment  4. Dispo-  As above - not a candidate for chronic dialysis - I dont feel his renal function  will likely improve to a great degree.  I agree to get palliative care involved for discussions regarding goals of care.  Without dialysis his prognosis is likely weeks to months   Louis Meckel 06/07/2018, 12:10 PM

## 2018-06-07 NOTE — Progress Notes (Signed)
Hypoglycemic Event  CBG: 61  Treatment: 8 oz juice/soda  Symptoms: None  Follow-up CBG: Time 0805 CBG Result:71  Possible Reasons for Event: Inadequate meal intake  Comments/MD notified: MD has been notified    Colin [name]

## 2018-06-07 NOTE — NC FL2 (Signed)
[place] LEVEL OF CARE SCREENING TOOL     IDENTIFICATION  Patient Name: Colin [name] Birthdate: 12-23-1928 Sex: male Admission Date (Current Location): 06/06/2018  [facility] and [place] Number:  Herbalist and Address:  [facility],  [place] [address], [place]      Provider Number: [id number]  Attending Physician Name and Address:  Darliss Cheney, MD  Relative Name and Phone Number:  Ashley Akin [phone]    Current Level of Care: Hospital Recommended Level of Care: [place] Prior Approval Number:    Date Approved/Denied:   PASRR Number: [id number]  Discharge Plan: SNF    Current Diagnoses: Patient Active Problem List   Diagnosis Date Noted  . Renal failure (ARF), acute on chronic ([place]) 06/06/2018  . Metabolic acidosis 13/24/4010  . Acute renal failure superimposed on stage 3 chronic kidney disease ([place]) 04/15/2018  . Elevated troponin 04/14/2018  . UTI (urinary tract infection) 04/14/2018  . Acute metabolic encephalopathy 27/25/3664  . Prostate cancer ([place]) 12/11/2016  . Essential hypertension 12/07/2015  . HLD (hyperlipidemia) 12/07/2015  . Sinus bradycardia 11/17/2015  . 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 Anemia, iron deficiency 11/06/2013  . Anemia of chronic disease 11/06/2013  . Acute blood loss anemia 10/29/2013  . Weakness generalized 10/20/2013  . Bacteremia 10/20/2013  . Type 2 diabetes, uncontrolled, with renal manifestation ([place]) 06/03/2013  . PAD (peripheral artery disease) ([place]) 04/30/2013  . Other stiff joint, of the lower leg 12/31/2012  . Fall 07/01/2012  . Hypoglycemia 07/01/2012  . Hypothermia 07/01/2012  . Failure to thrive in adult 02/27/2012  . Non-compliant patient 02/27/2012  . Microscopic hematuria 01/11/2012  . Syncope 01/09/2012  . CKD (chronic kidney disease), stage IV ([place]) 01/09/2012  . Diabetes mellitus  with peripheral artery disease ([place])   . Pain in limb 12/29/2011  . Chronic systolic CHF (congestive heart failure) ([place])   . Acute kidney injury ([place])   . History of CVA (cerebrovascular accident)   . Ischemic cardiomyopathy   . COPD (chronic obstructive pulmonary disease) ([place])   . History of anemia   . Coronary artery disease involving native heart without angina pectoris 11/30/2009  . Cardiac pacemaker in situ 11/30/2009    Orientation RESPIRATION BLADDER Height & Weight     Self, Place  Normal Incontinent Weight: 78.9 kg Height:  5\' 9"  (175.3 cm)  BEHAVIORAL SYMPTOMS/MOOD NEUROLOGICAL BOWEL NUTRITION STATUS      Incontinent Diet(renal diet)  AMBULATORY STATUS COMMUNICATION OF NEEDS Skin   Limited Assist Verbally Other (Comment)(decubitus coccyx-dsg,amputated great toe,2nd toe)                       Personal Care Assistance Level of Assistance  Bathing, Feeding, Dressing Bathing Assistance: Limited assistance Feeding assistance: Limited assistance Dressing Assistance: Limited assistance     Functional Limitations Info             SPECIAL CARE FACTORS FREQUENCY  PT (By licensed PT), OT (By licensed OT)     PT Frequency: 5x week OT Frequency: 5x week            Contractures Contractures Info: Not present    Additional Factors Info  Code Status, Allergies Code Status Info: Full code Allergies Info: Lisinopril           Current Medications (06/07/2018):  This is the current hospital active medication list Current Facility-Administered Medications  Medication Dose Route Frequency Provider Last Rate Last Dose  . acetaminophen (TYLENOL) tablet 650 mg  650 mg Oral Q6H PRN Darliss Cheney, MD       Or  . acetaminophen (TYLENOL) suppository 650 mg  650 mg Rectal Q6H PRN Darliss Cheney, MD      . albuterol (PROVENTIL) (2.5 MG/3ML) 0.083% nebulizer solution 2.5 mg  2.5 mg Nebulization Q4H PRN Pahwani, Ravi, MD      . amLODipine (NORVASC) tablet 2.5 mg  2.5 mg  Oral Daily Darliss Cheney, MD   2.5 mg at 06/07/18 0902  . ezetimibe (ZETIA) tablet 10 mg  10 mg Oral Daily Darliss Cheney, MD   10 mg at 06/07/18 0901  . feeding supplement (GLUCERNA SHAKE) (GLUCERNA SHAKE) liquid 237 mL  237 mL Oral BID BM Pahwani, Ravi, MD      . feeding supplement (PRO-STAT SUGAR FREE 64) liquid 30 mL  30 mL Oral BID Pahwani, Ravi, MD      . ferrous sulfate tablet 325 mg  325 mg Oral Q breakfast Darliss Cheney, MD   325 mg at 06/07/18 0901  . heparin injection 5,000 Units  5,000 Units Subcutaneous Q8H Darliss Cheney, MD   5,000 Units at 06/07/18 0603  . hydrALAZINE (APRESOLINE) tablet 10 mg  10 mg Oral Q8H Darliss Cheney, MD   10 mg at 06/07/18 0604  . insulin aspart (novoLOG) injection 0-5 Units  0-5 Units Subcutaneous QHS Pahwani, Ravi, MD      . insulin aspart (novoLOG) injection 0-9 Units  0-9 Units Subcutaneous TID WC Pahwani, Ravi, MD      . isosorbide mononitrate (IMDUR) 24 hr tablet 30 mg  30 mg Oral Daily Darliss Cheney, MD   30 mg at 06/07/18 0901  . magnesium hydroxide (MILK OF MAGNESIA) suspension 30 mL  30 mL Oral Daily PRN Darliss Cheney, MD      . senna-docusate (Senokot-S) tablet 1 tablet  1 tablet Oral QHS PRN Darliss Cheney, MD      . sodium chloride flush (NS) 0.9 % injection 3 mL  3 mL Intravenous Q12H Pahwani, Ravi, MD      . vitamin B-12 (CYANOCOBALAMIN) tablet 1,000 mcg  1,000 mcg Oral Daily Darliss Cheney, MD   1,000 mcg at 06/07/18 0902     Discharge Medications: Please see discharge summary for a list of discharge medications.  Relevant Imaging Results:  Relevant Lab Results:   Additional Information [id number]  Dessa Phi, RN

## 2018-06-07 NOTE — Progress Notes (Signed)
Initial Nutrition Assessment  INTERVENTION:   -Provide Glucerna Shake po BID, each supplement provides 220 kcal and 10 grams of protein -Provide Prostat liquid protein PO 30 ml BID with meals, each supplement provides 100 kcal, 15 grams protein.  NUTRITION DIAGNOSIS:   Increased nutrient needs related to wound healing as evidenced by estimated needs.  GOAL:   Patient will meet greater than or equal to 90% of their needs  MONITOR:   PO intake, Supplement acceptance, Labs, Weight trends, I & O's, Skin, GOC  REASON FOR ASSESSMENT:   (Wound)    ASSESSMENT:   85 y.o. 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   **RD working remotely**  Patient with worsening dementia, currently not able to provide any history. Per nephrology note, pt not a candidate for dialysis. Palliative care consulted for [place]. Will monitor. Pt currently consuming 25% of meals today. RD ordered Glucerna shakes and Prostat supplements. Pt had a hypoglycemic event this morning d/t poor PO intake.   Per weight records, pt has increased weight related to fluid. Pt with moderate edema in upper and lower extremities.  Medications: Ferrous sulfate tablet daily, Lasix tablet BID, Vitamin B-12 tablet daily  Labs reviewed: CBGs: 71-108 GFR: 7  NUTRITION - FOCUSED PHYSICAL EXAM:  Unable to perform per department requirements to work remotely.  Diet Order:   Diet Order            Diet renal with fluid restriction Fluid restriction: Other (see comments); Room service appropriate? Yes; Fluid  consistency: Thin  Diet effective now              EDUCATION NEEDS:   No education needs have been identified at this time  Skin:  Skin Assessment: Skin Integrity Issues: Skin Integrity Issues:: Stage II Stage II: coccyx  Last BM:  5/15  Height:   Ht Readings from Last 1 Encounters:  06/06/18 5\' 9"  (1.753 m)    Weight:   Wt Readings from Last 1 Encounters:  06/07/18 78.9 kg    Ideal Body Weight:  72.7 kg  BMI:  Body mass index is 25.69 kg/m.  Estimated Nutritional Needs:   Kcal:  5053-9767  Protein:  85-95g  Fluid:  1.7L/day  Clayton Bibles, MS, RD, LDN [place] Dietitian Pager: [phone] After Hours Pager: [phone]

## 2018-06-07 NOTE — TOC Initial Note (Signed)
Transition of Care [facility]) - Initial/Assessment Note    Patient Details  Name: Colin [name] MRN: [id number] Date of Birth: 10/07/1928  [facility] CM/SW Contact:    Dessa Phi, RN Phone Number: 06/07/2018, 11:36 AM  Clinical Narrative:From [facility]-spoke to rep Sheila-aware of patient here & able to accept back-will need COVID neg results prior return-MD notified.                   Expected Discharge Plan: Skilled Nursing Facility Barriers to Discharge: Continued Medical Work up   Patient Goals and CMS Choice Patient states their goals for this hospitalization and ongoing recovery are:: get better CMS Medicare.gov Compare Post Acute Care list provided to:: Patient Represenative (must comment)(dtr Ashley Akin) Choice offered to / list presented to : Adult Children  Expected Discharge Plan and Services Expected Discharge Plan: [place]   Discharge Planning Services: CM Consult Post Acute Care Choice: [place] Living arrangements for the past 2 months: [place]                                      Prior Living Arrangements/Services Living arrangements for the past 2 months: [place] Lives with:: Facility Resident Patient language and need for interpreter reviewed:: Yes        Need for Family Participation in Patient Care: No (Comment) Care giver support system in place?: Yes (comment)   Criminal Activity/Legal Involvement Pertinent to Current Situation/Hospitalization: No - Comment as needed  Activities of Daily Living Home Assistive Devices/Equipment: Eyeglasses ADL Screening (condition at time of admission) Patient's cognitive ability adequate to safely complete daily activities?: No Is the patient deaf or have difficulty hearing?: No Does the patient have difficulty seeing, even when wearing glasses/contacts?: No Does the patient have difficulty concentrating, remembering, or  making decisions?: Yes Patient able to express need for assistance with ADLs?: Yes Does the patient have difficulty dressing or bathing?: Yes Independently performs ADLs?: No Communication: Independent Dressing (OT): Dependent Is this a change from baseline?: Pre-admission baseline Grooming: Dependent Is this a change from baseline?: Pre-admission baseline Feeding: Needs assistance Is this a change from baseline?: Pre-admission baseline Bathing: Dependent Is this a change from baseline?: Pre-admission baseline Toileting: Dependent Is this a change from baseline?: Pre-admission baseline In/Out Bed: Needs assistance Is this a change from baseline?: Pre-admission baseline Walks in Home: Dependent Is this a change from baseline?: Pre-admission baseline Does the patient have difficulty walking or climbing stairs?: Yes Weakness of Legs: Both Weakness of Arms/Hands: Both  Permission Sought/Granted Permission sought to share information with : Case Manager Permission granted to share information with : Yes, Verbal Permission Granted  Share Information with NAME: Ashley Akin  Permission granted to share info w AGENCY: [facility]  Permission granted to share info w Relationship: dtr  Permission granted to share info w Contact Information: [phone]  Emotional Assessment Appearance:: Appears stated age Attitude/Demeanor/Rapport: Engaged Affect (typically observed): Accepting Orientation: : Oriented to Self, Oriented to Place Alcohol / Substance Use: Tobacco Use Psych Involvement: No (comment)  Admission diagnosis:  AKI (acute kidney injury) ([place]) [N17.9] Patient Active Problem List   Diagnosis Date Noted  . Renal failure (ARF), acute on chronic ([place]) 06/06/2018  . Metabolic acidosis 94/85/4627  . Acute renal failure superimposed on stage 3 chronic kidney disease ([place]) 04/15/2018  . Elevated troponin 04/14/2018  . UTI (urinary  tract infection) 04/14/2018  . Acute  metabolic encephalopathy 58/83/2549  . Prostate cancer ([place]) 12/11/2016  . Essential hypertension 12/07/2015  . HLD (hyperlipidemia) 12/07/2015  . Sinus bradycardia 11/17/2015  . 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 Anemia, iron deficiency 11/06/2013  . Anemia of chronic disease 11/06/2013  . Acute blood loss anemia 10/29/2013  . Weakness generalized 10/20/2013  . Bacteremia 10/20/2013  . Type 2 diabetes, uncontrolled, with renal manifestation ([place]) 06/03/2013  . PAD (peripheral artery disease) ([place]) 04/30/2013  . Other stiff joint, of the lower leg 12/31/2012  . Fall 07/01/2012  . Hypoglycemia 07/01/2012  . Hypothermia 07/01/2012  . Failure to thrive in adult 02/27/2012  . Non-compliant patient 02/27/2012  . Microscopic hematuria 01/11/2012  . Syncope 01/09/2012  . CKD (chronic kidney disease), stage IV ([place]) 01/09/2012  . Diabetes mellitus with peripheral artery disease ([place])   . Pain in limb 12/29/2011  . Chronic systolic CHF (congestive heart failure) ([place])   . Acute kidney injury ([place])   . History of CVA (cerebrovascular accident)   . Ischemic cardiomyopathy   . COPD (chronic obstructive pulmonary disease) ([place])   . History of anemia   . Coronary artery disease involving native heart without angina pectoris 11/30/2009  . Cardiac pacemaker in situ 11/30/2009   PCP:  Deland Pretty, MD Pharmacy:   [id number] - [place], [place] [address] Phone: [phone] Fax: [phone]  [place], [place] [facility] [address] [place] [address] [place] [address] Phone: [phone] Fax: [phone]     Social Determinants of Health (SDOH) Interventions    Readmission Risk Interventions No flowsheet data found.

## 2018-06-07 NOTE — Consult Note (Addendum)
Consultation Note Date: 06/07/2018   Patient Name: Colin [name]  DOB: [birth year]  MRN: [id number]  Age / Sex: 85 y.o., male  PCP: Deland Pretty, MD Referring Physician: Darliss Cheney, MD  Reason for Consultation: Establishing goals of care  HPI/Patient Profile: 85 y.o. male  with past medical history of dementia, HTN, DM, COPD< CVA, CAD s/p CABG, metastatic prostate cancer, and CKD admitted on 06/06/2018 d/t labs at nursing home revealing creatinine of 7.71. Per nephrology, no reversible cause of kidney failure identified and not a dialysis candidate. PMT consulted for [place].   Clinical Assessment and Goals of Care: I have reviewed medical records including EPIC notes, labs and imaging.  Patient has dementia and is unable to discuss goals of care.  Per ED physician note, daughter has requested to be the first to be contacted. I attempted to contact daughter however she did not answer, voicemail left with call back number.   Per nephrology, no reversible causes of renal failure have been identified and patient is not a HD candidate. Also see that daughter has stated to ED physician that patient would never want dialysis.   Recommended plan would be hospice care at SNF patient is LTC resident at and code status change to DNR, with shift in focus of care to comfort and avoiding aggressive medical interventions. However, need to discuss with family.   PMT will continue to attempt to discuss plan of care with family.  Prognosis likely weeks.   Update: Daughter called back and we had conference call with daughter, patient's wife, and son-in-law. We discussed the above - renal failure and not a dialysis candidate. They express understanding. They ask about prognosis and we discussed that he is nearing end of life and prognosis is likely weeks. We discuss focusing on Mr. Lea comfort and avoiding aggressive medical interventions. They agree to this.  We discussed hospice care. They are specifically interested in [facility]. Discussed we could refer patient to [facility]. They are also agreeable to patient receiving hosp[facility] care at [facility] if [facility] does not work out; however, [facility] is first choice. We then discussed code status and why full code is not in alignment with other goals for comfort - they express understanding and agree to code status change to DNR. All questions and concerns addressed. Luanna Salk asks that I call her brother, Haydan Mansouri and share the above conversation. Vihan did not answer - left voicemail with callback number.   Will consult social work for hospice placement and change code status to DNR.    Primary Decision Maker NEXT OF KIN - patient has wife listed in demographics who would be legal decision maker however daughter has requested to be point of contact - need to discuss situation w/daughter for who legal decision maker is - unsure of wife cognitive status?  Update: during discussion decisions made jointly between daughter and patient's wife - wife is legal decision maker  SUMMARY OF RECOMMENDATIONS   - code status changed to DNR - recommend focus on comfort, avoiding all aggressive/uncomfortable medical interventions - family interested is hospice care - specifically [facility] - will ask social work to reach out - if not accepted by [facility] they agree to transfer back to [facility] with hospice care there  [place]:  DNR  Psycho-social/Spiritual:   Desire for further Chaplaincy support:no  Additional Recommendations: Education on Hospice  Prognosis:   < 6 weeks  Discharge Planning: Hospice facility? Family requests [facility]      Primary Diagnoses: Present on Admission: . Renal failure (ARF), acute on chronic ([place])   I have reviewed the medical record, interviewed the patient and family, and examined the patient. The following aspects are  pertinent.  Past Medical History:  Diagnosis Date  . Anemia 10/29/2013  . Arthritis   . Bradycardia   . Cardiomyopathy   . Carotid artery disease ([place])    a. Carotid US 2/17: R 50-69, L plaque.  . Chronic systolic CHF (congestive heart failure) ([place])   . CKD (chronic kidney disease), stage IV ([place])   . COPD (chronic obstructive pulmonary disease) ([place])   . Coronary artery disease    Status post CABG in 2004  . Dementia ([place])   . Diabetes mellitus with peripheral artery disease ([place])   . Dyslipidemia   . GERD (gastroesophageal reflux disease)   . History of anemia   . History of CVA (cerebrovascular accident)   . Hypertension   . Myocardial infarction ([place])   . Peripheral vascular disease ([place])    Status post right femoropopliteal bypass  . Prostate cancer ([place])    metastatic  . Prostate cancer ([place]) 12/11/2016  . Prostate cancer ([place]) 12/11/2016  . SSS (sick sinus syndrome) ([place])    a. s/p PPM 2007 with lead vegetation in 2015 s/p extraction of pacemaker leads.   Social History   Socioeconomic History  . Marital status: Married    Spouse name: Not on file  . Number of children: Not on file  . Years of education: Not on file  . Highest education level: Not on file  Occupational History  . Not on file  Social Needs  . Financial resource strain: Not on file  . Food insecurity:    Worry: Not on file    Inability: Not on file  . Transportation needs:    Medical: Not on file    Non-medical: Not on file  Tobacco Use  . Smoking status: Former Smoker    Packs/day: 1.00    Years: 50.00    Pack years: 50.00    Types: Cigarettes    Last attempt to quit: 06/09/2002    Years since quitting: 16.0  . Smokeless tobacco: Never Used  Substance and Sexual Activity  . Alcohol use: No  . Drug use: No  . Sexual activity: Not Currently  Lifestyle  . Physical activity:    Days per week: Not on file    Minutes per session: Not on file  . Stress: Not on file  Relationships  .  Social connections:    Talks on phone: Not on file    Gets together: Not on file    Attends religious service: Not on file    Active member of club or organization: Not on file    Attends meetings of clubs or organizations: Not on file    Relationship status: Not on file  Other Topics Concern  . Not on file  Social History Narrative   Lives in a house.  Lives with his wife.  Uses a cane for ambulation.     Family History  Problem Relation Age of Onset  . Hyperlipidemia Mother   . Heart disease Father   . Diabetes Sister   . Hypertension Sister   . Hyperlipidemia Sister   . Diabetes Other    Scheduled Meds: . ezetimibe  10 mg Oral Daily  . feeding supplement (GLUCERNA SHAKE)  237 mL Oral BID BM  . feeding supplement (  PRO-STAT SUGAR FREE 64)  30 mL Oral BID  . ferrous sulfate  325 mg Oral Q breakfast  . furosemide  40 mg Oral BID  . heparin  5,000 Units Subcutaneous Q8H  . insulin aspart  0-5 Units Subcutaneous QHS  . insulin aspart  0-9 Units Subcutaneous TID WC  . isosorbide mononitrate  30 mg Oral Daily  . sodium chloride flush  3 mL Intravenous Q12H  . vitamin B-12  1,000 mcg Oral Daily   Continuous Infusions: PRN Meds:.acetaminophen **OR** acetaminophen, albuterol, magnesium hydroxide, senna-docusate Allergies  Allergen Reactions  . Lisinopril Other (See Comments)    REACTION:  Elevated potassium,renal insuf    Vital Signs: BP 111/79 (BP Location: Left Arm)   Pulse (!) 54   Temp 97.7 F (36.5 C) (Oral)   Resp 18   Ht 5\' 9"  (1.753 m)   Wt 78.9 kg   SpO2 98%   BMI 25.69 kg/m  Pain Scale: 0-10   Pain Score: Asleep   SpO2: SpO2: 98 % O2 Device:SpO2: 98 % O2 Flow Rate: .   IO: Intake/output summary:   Intake/Output Summary (Last 24 hours) at 06/07/2018 1610 Last data filed at 06/07/2018 1500 Gross per 24 hour  Intake 1553.38 ml  Output 300 ml  Net 1253.38 ml    LBM: Last BM Date: 06/07/18 Baseline Weight: Weight: 78.1 kg Most recent weight: Weight:  78.9 kg     Palliative Assessment/Data: UTA    The above conversation was completed via telephone due to the visitor restrictions during the COVID-19 pandemic. Thorough chart review and discussion with necessary members of the care team was completed as part of assessment. All issues were discussed and addressed but no physical exam was performed.   Time Total: 70 minutes Greater than 50%  of this time was spent counseling and coordinating care related to the above assessment and plan.  Juel Burrow, DNP, AGNP-C Palliative Medicine Team [phone] Pager: [phone]

## 2018-06-08 DIAGNOSIS — J90 Pleural effusion, not elsewhere classified: Secondary | ICD-10-CM

## 2018-06-08 DIAGNOSIS — N179 Acute kidney failure, unspecified: Principal | ICD-10-CM

## 2018-06-08 DIAGNOSIS — N184 Chronic kidney disease, stage 4 (severe): Secondary | ICD-10-CM

## 2018-06-08 DIAGNOSIS — Z515 Encounter for palliative care: Secondary | ICD-10-CM

## 2018-06-08 DIAGNOSIS — Z7189 Other specified counseling: Secondary | ICD-10-CM

## 2018-06-08 LAB — CBC WITH DIFFERENTIAL/PLATELET
Abs Immature Granulocytes: 0.01 10*3/uL (ref 0.00–0.07)
Basophils Absolute: 0 10*3/uL (ref 0.0–0.1)
Basophils Relative: 1 %
Eosinophils Absolute: 0 10*3/uL (ref 0.0–0.5)
Eosinophils Relative: 1 %
HCT: 35.6 % — ABNORMAL LOW (ref 39.0–52.0)
Hemoglobin: 10.6 g/dL — ABNORMAL LOW (ref 13.0–17.0)
Immature Granulocytes: 0 %
Lymphocytes Relative: 17 %
Lymphs Abs: 0.6 10*3/uL — ABNORMAL LOW (ref 0.7–4.0)
MCH: 25.1 pg — ABNORMAL LOW (ref 26.0–34.0)
MCHC: 29.8 g/dL — ABNORMAL LOW (ref 30.0–36.0)
MCV: 84.2 fL (ref 80.0–100.0)
Monocytes Absolute: 0.5 10*3/uL (ref 0.1–1.0)
Monocytes Relative: 14 %
Neutro Abs: 2.4 10*3/uL (ref 1.7–7.7)
Neutrophils Relative %: 67 %
Platelets: 133 10*3/uL — ABNORMAL LOW (ref 150–400)
RBC: 4.23 MIL/uL (ref 4.22–5.81)
RDW: 21.5 % — ABNORMAL HIGH (ref 11.5–15.5)
WBC: 3.6 10*3/uL — ABNORMAL LOW (ref 4.0–10.5)
nRBC: 0 % (ref 0.0–0.2)

## 2018-06-08 LAB — COMPREHENSIVE METABOLIC PANEL
ALT: 16 U/L (ref 0–44)
AST: 19 U/L (ref 15–41)
Albumin: 2.7 g/dL — ABNORMAL LOW (ref 3.5–5.0)
Alkaline Phosphatase: 52 U/L (ref 38–126)
Anion gap: 15 (ref 5–15)
BUN: 150 mg/dL — ABNORMAL HIGH (ref 8–23)
CO2: 15 mmol/L — ABNORMAL LOW (ref 22–32)
Calcium: 8.1 mg/dL — ABNORMAL LOW (ref 8.9–10.3)
Chloride: 107 mmol/L (ref 98–111)
Creatinine, Ser: 7.13 mg/dL — ABNORMAL HIGH (ref 0.61–1.24)
GFR calc Af Amer: 7 mL/min — ABNORMAL LOW (ref >60)
GFR calc non Af Amer: 6 mL/min — ABNORMAL LOW (ref >60)
Glucose, Bld: 101 mg/dL — ABNORMAL HIGH (ref 70–99)
Potassium: 4.5 mmol/L (ref 3.5–5.1)
Sodium: 137 mmol/L (ref 135–145)
Total Bilirubin: 0.5 mg/dL (ref 0.3–1.2)
Total Protein: 6.1 g/dL — ABNORMAL LOW (ref 6.5–8.1)

## 2018-06-08 LAB — GLUCOSE, CAPILLARY
Glucose-Capillary: 94 mg/dL (ref 70–99)
Glucose-Capillary: 87 mg/dL (ref 70–99)

## 2018-06-08 MED ORDER — OXYCODONE-ACETAMINOPHEN 5-325 MG PO TABS
1.0000 | ORAL_TABLET | Freq: Three times a day (TID) | ORAL | Status: DC | PRN
  Administered 2018-06-08: 1 via ORAL
  Filled 2018-06-08: qty 1

## 2018-06-08 MED ORDER — FENTANYL CITRATE (PF) 100 MCG/2ML IJ SOLN: 12.5000 ug | INTRAMUSCULAR | Status: DC | PRN

## 2018-06-08 NOTE — Progress Notes (Signed)
No change in clinical status or labs.  I see that plan is in place for patient to be discharged to possibly residential hospice- in addition now patient DNR. I wish I could have been of more assistance.  Appreciate palliative care assist.   Renal will sign off  Louis Meckel

## 2018-06-08 NOTE — Discharge Instructions (Signed)
Acute Kidney Injury, Adult ° °Acute kidney injury is a sudden worsening of kidney function. The kidneys are organs that have several jobs. They filter the blood to remove waste products and extra fluid. They also maintain a healthy balance of minerals and hormones in the body, which helps control blood pressure and keep bones strong. With this condition, your kidneys do not do their jobs as well as they should. °This condition ranges from mild to severe. Over time it may develop into long-lasting (chronic) kidney disease. Early detection and treatment may prevent acute kidney injury from developing into a chronic condition. °What are the causes? °Common causes of this condition include: °· A problem with blood flow to the kidneys. This may be caused by: °? Low blood pressure (hypotension) or shock. °? Blood loss. °? Heart and blood vessel (cardiovascular) disease. °? Severe burns. °? Liver disease. °· Direct damage to the kidneys. This may be caused by: °? Certain medicines. °? A kidney infection. °? Poisoning. °? Being around or in contact with toxic substances. °? A surgical wound. °? A hard, direct hit to the kidney area. °· A sudden blockage of urine flow. This may be caused by: °? Cancer. °? Kidney stones. °? An enlarged prostate in males. °What are the signs or symptoms? °Symptoms of this condition may not be obvious until the condition becomes severe. Symptoms of this condition can include: °· Tiredness (lethargy), or difficulty staying awake. °· Nausea or vomiting. °· Swelling (edema) of the face, legs, ankles, or feet. °· Problems with urination, such as: °? Abdominal pain, or pain along the side of your stomach (flank). °? Decreased urine production. °? Decrease in the force of urine flow. °· Muscle twitches and cramps, especially in the legs. °· Confusion or trouble concentrating. °· Loss of appetite. °· Fever. °How is this diagnosed? °This condition may be diagnosed with tests, including: °· Blood  tests. °· Urine tests. °· Imaging tests. °· A test in which a sample of tissue is removed from the kidneys to be examined under a microscope (kidney biopsy). °How is this treated? °Treatment for this condition depends on the cause and how severe the condition is. In mild cases, treatment may not be needed. The kidneys may heal on their own. In more severe cases, treatment will involve: °· Treating the cause of the kidney injury. This may involve changing any medicines you are taking or adjusting your dosage. °· Fluids. You may need specialized IV fluids to balance your body's needs. °· Having a catheter placed to drain urine and prevent blockages. °· Preventing problems from occurring. This may mean avoiding certain medicines or procedures that can cause further injury to the kidneys. °In some cases treatment may also require: °· A procedure to remove toxic wastes from the body (dialysis or continuous renal replacement therapy - CRRT). °· Surgery. This may be done to repair a torn kidney, or to remove the blockage from the urinary system. °Follow these instructions at home: °Medicines °· Take over-the-counter and prescription medicines only as told by your health care provider. °· Do not take any new medicines without your health care provider's approval. Many medicines can worsen your kidney damage. °· Do not take any vitamin and mineral supplements without your health care provider's approval. Many nutritional supplements can worsen your kidney damage. °Lifestyle °· If your health care provider prescribed changes to your diet, follow them. You may need to decrease the amount of protein you eat. °· Achieve and maintain a healthy   weight. If you need help with this, ask your health care provider. °· Start or continue an exercise plan. Try to exercise at least 30 minutes a day, 5 days a week. °· Do not use any tobacco products, such as cigarettes, chewing tobacco, and e-cigarettes. If you need help quitting, ask your  health care provider. °General instructions °· Keep track of your blood pressure. Report changes in your blood pressure as told by your health care provider. °· Stay up to date with immunizations. Ask your health care provider which immunizations you need. °· Keep all follow-up visits as told by your health care provider. This is important. °Where to find more information °· American Association of Kidney Patients: www.aakp.org °· National Kidney Foundation: www.kidney.org °· American Kidney Fund: www.akfinc.org °· Life Options Rehabilitation Program: °? www.lifeoptions.org °? www.kidneyschool.org °Contact a health care provider if: °· Your symptoms get worse. °· You develop new symptoms. °Get help right away if: °· You develop symptoms of worsening kidney disease, which include: °? Headaches. °? Abnormally dark or light skin. °? Easy bruising. °? Frequent hiccups. °? Chest pain. °? Shortness of breath. °? End of menstruation in women. °? Seizures. °? Confusion or altered mental status. °? Abdominal or back pain. °? Itchiness. °· You have a fever. °· Your body is producing less urine. °· You have pain or bleeding when you urinate. °Summary °· Acute kidney injury is a sudden worsening of kidney function. °· Acute kidney injury can be caused by problems with blood flow to the kidneys, direct damage to the kidneys, and sudden blockage of urine flow. °· Symptoms of this condition may not be obvious until it becomes severe. Symptoms may include edema, lethargy, confusion, nausea or vomiting, and problems passing urine. °· This condition can usually be diagnosed with blood tests, urine tests, and imaging tests. Sometimes a kidney biopsy is done to diagnose this condition. °· Treatment for this condition often involves treating the underlying cause. It is treated with fluids, medicines, dialysis, diet changes, or surgery. °This information is not intended to replace advice given to you by your health care provider. Make  sure you discuss any questions you have with your health care provider. °Document Released: 07/25/2010 Document Revised: 05/11/2016 Document Reviewed: 12/31/2015 °Elsevier Interactive Patient Education © 2019 [employer]. ° °

## 2018-06-08 NOTE — Progress Notes (Signed)
Arranged transportation to [facility]. Report [phone]  Pt's family completed paperwork for admission.   Sharren Bridge, LCSW, Transitions of Care Weekend coverage [phone]

## 2018-06-08 NOTE — Progress Notes (Signed)
Daily Progress Note   Patient Name: Colin [name]       Date: 06/08/2018 DOB: May 29, 1928  Age: 85 y.o. MRN#: [id number] Attending Physician: Darliss Cheney, MD Primary Care Physician: Deland Pretty, MD Admit Date: 06/06/2018  Reason for Consultation/Follow-up: Terminal Care  Subjective:  patient is some what alert, when I say his name, he says, "what?"  He states that his hips hurt.    Length of Stay: 2  Current Medications: Scheduled Meds:  . ezetimibe  10 mg Oral Daily  . feeding supplement (GLUCERNA SHAKE)  237 mL Oral BID BM  . feeding supplement (PRO-STAT SUGAR FREE 64)  30 mL Oral BID  . ferrous sulfate  325 mg Oral Q breakfast  . furosemide  40 mg Oral BID  . heparin  5,000 Units Subcutaneous Q8H  . insulin aspart  0-5 Units Subcutaneous QHS  . insulin aspart  0-9 Units Subcutaneous TID WC  . isosorbide mononitrate  30 mg Oral Daily  . sodium chloride flush  3 mL Intravenous Q12H  . vitamin B-12  1,000 mcg Oral Daily    Continuous Infusions:   PRN Meds: acetaminophen **OR** acetaminophen, albuterol, magnesium hydroxide, oxyCODONE-acetaminophen, senna-docusate  Physical Exam         Frail gentleman Is confused, is able to talk about where he hurts Diminished breath sounds Abdomen is distended and tender, patient doesn't necessarily want his abdomen touched Has edema S1 S2   Vital Signs: BP 107/67 (BP Location: Left Arm)   Pulse (!) 109   Temp 98 F (36.7 C)   Resp 20   Ht 5\' 9"  (1.753 m)   Wt 78.9 kg   SpO2 92%   BMI 25.69 kg/m  SpO2: SpO2: 92 % O2 Device: O2 Device: Room Air O2 Flow Rate:    Intake/output summary:   Intake/Output Summary (Last 24 hours) at 06/08/2018 1057 Last data filed at 06/08/2018 0530 Gross per 24 hour  Intake 120 ml   Output 500 ml  Net -380 ml   LBM: Last BM Date: 06/07/18 Baseline Weight: Weight: 78.1 kg Most recent weight: Weight: 78.9 kg       Palliative Assessment/Data:    Flowsheet Rows     Most Recent Value  Intake Tab  Unit at Time of Referral  Cardiac/Telemetry Unit  Palliative Care Primary  Diagnosis  Nephrology  Date Notified  06/07/18  Palliative Care Type  New Palliative care  Reason for referral  Clarify Goals of Care  Date of Admission  06/07/18  Date first seen by Palliative Care  06/07/18  # of days Palliative referral response time  0 Day(s)  # of days IP prior to Palliative referral  0  Clinical Assessment  Psychosocial & Spiritual Assessment  Palliative Care Outcomes  Patient/Family meeting held?  Yes  Who was at the meeting?  daughter and wife  Palliative Care Outcomes  Clarified goals of care, Counseled regarding hospice, Provided end of life care assistance, Provided advance care planning, Changed to focus on comfort, Provided psychosocial or spiritual support, Changed CPR status, Transitioned to hospice      Patient Active Problem List   Diagnosis Date Noted  . Pressure injury of skin 06/07/2018  . Palliative care by specialist   . Goals of care, counseling/discussion   . Renal failure (ARF), acute on chronic ([place]) 06/06/2018  . Metabolic acidosis 44/81/8563  . Acute renal failure superimposed on stage 3 chronic kidney disease ([place]) 04/15/2018  . Elevated troponin 04/14/2018  . UTI (urinary tract infection) 04/14/2018  . Acute metabolic encephalopathy 14/97/0263  . Prostate cancer ([place]) 12/11/2016  . Essential hypertension 12/07/2015  . HLD (hyperlipidemia) 12/07/2015  . Sinus bradycardia 11/17/2015  . 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 Anemia, iron deficiency 11/06/2013  . Anemia of chronic disease 11/06/2013  . Acute blood loss anemia 10/29/2013  . Weakness generalized 10/20/2013  . Bacteremia  10/20/2013  . Type 2 diabetes, uncontrolled, with renal manifestation ([place]) 06/03/2013  . PAD (peripheral artery disease) ([place]) 04/30/2013  . Other stiff joint, of the lower leg 12/31/2012  . Fall 07/01/2012  . Hypoglycemia 07/01/2012  . Hypothermia 07/01/2012  . Failure to thrive in adult 02/27/2012  . Non-compliant patient 02/27/2012  . Microscopic hematuria 01/11/2012  . Syncope 01/09/2012  . CKD (chronic kidney disease), stage IV ([place]) 01/09/2012  . Diabetes mellitus with peripheral artery disease ([place])   . Pain in limb 12/29/2011  . Chronic systolic CHF (congestive heart failure) ([place])   . AKI (acute kidney injury) ([place])   . History of CVA (cerebrovascular accident)   . Ischemic cardiomyopathy   . COPD (chronic obstructive pulmonary disease) ([place])   . History of anemia   . Coronary artery disease involving native heart without angina pectoris 11/30/2009  . Cardiac pacemaker in situ 11/30/2009    Palliative Care Assessment & Plan   Patient Profile:    Assessment:  85 yo gentleman with CKD with AKI Failure to thrive History of HTN HLD DM COPD stroke GERD anemia CAD s/p CABG PVD SNF resident Anasarca, pleural effusion Low albumin Pain in hips and back and abdomen.   Recommendations/Plan:   Add low dose Fentanyl IV PRN for pain  Continue current mode of care  Appreciate CSW input, search underway for a hospice bed.   Call placed, unable to reach wife, call placed, discussed with daughter Luanna Salk who endorses focus on comfort measures and transfer to hospice house. We reviewed that his renal function continues to worsen, that we will augment his pain regimen and monitor. She wants to be notified when the patient is being transferred to hospice.   She asks for the patient to be encouraged to take some POs such as sips of liquids if safe, also oral care.    Code Status:  Code Status Orders  (From admission, onward)         Start     Ordered   06/07/18 1704   Do not attempt resuscitation (DNR)  Continuous    Question Answer Comment  In the event of cardiac or respiratory ARREST Do not call a "code blue"   In the event of cardiac or respiratory ARREST Do not perform Intubation, CPR, defibrillation or ACLS   In the event of cardiac or respiratory ARREST Use medication by any route, position, wound care, and other measures to relive pain and suffering. May use oxygen, suction and manual treatment of airway obstruction as needed for comfort.      06/07/18 1703        Code Status History    Date Active Date Inactive Code Status Order ID Comments User Context   06/06/2018 2028 06/07/2018 1703 Full Code [id number]  Darliss Cheney, MD Inpatient   04/14/2018 0501 04/18/2018 1545 Full Code [id number]  Ivor Costa, MD ED   10/29/2013 1526 10/31/2013 2153 Full Code [id number]  Azzie Roup, MD Inpatient   10/27/2013 1209 10/29/2013 1526 Full Code [id number]  Evans Lance, MD Inpatient   10/19/2013 1018 10/27/2013 1209 Full Code [id number]  Reyne Dumas, MD ED   05/12/2013 2223 10/19/2013 1018 Full Code [id number]  Hennie Duos, MD Outpatient   05/05/2013 1722 05/08/2013 1642 Full Code [id number]  Newt Minion, MD Inpatient   05/01/2013 1509 05/05/2013 1722 Full Code [id number]  Gabriel Earing, PA-C Inpatient   04/30/2013 1621 05/01/2013 1509 Full Code [id number]  Gabriel Earing, PA-C Inpatient   07/01/2012 1738 07/02/2012 1815 Full Code [id number]  Janece Canterbury, MD Inpatient   02/27/2012 2301 03/01/2012 1610 Full Code [id number]  Carmon Ginsberg, RN Inpatient   01/09/2012 2038 01/11/2012 1433 Full Code [id number]  Candace Gallus, RN Inpatient   10/26/2011 1632 10/27/2011 0424 Full Code [id number]  Mervin Kung., MD ED       Prognosis:   < 2 weeks  Discharge Planning:  Hospice facility  Care plan was discussed with  Call placed to patient's wife whom I was unable to reach, call placed and updated daughter Luanna Salk.   Thank you for allowing the Palliative  Medicine Team to assist in the care of this patient.   Time In: 9 Time Out: 9.35 Total Time 35 Prolonged Time Billed  no       Greater than 50%  of this time was spent counseling and coordinating care related to the above assessment and plan.  Loistine Chance, MD [phone] Please contact Palliative Medicine Team phone at [phone] for questions and concerns.

## 2018-06-08 NOTE — TOC Progression Note (Signed)
Transition of Care [facility]) - Progression Note    Patient Details  Name: Colin [name] MRN: [id number] Date of Birth: 01-05-1929  Transition of Care [facility] CM/SW [place], [place] Phone Number: [phone] weekend coverage 06/08/2018, 9:00 AM  Clinical Narrative:  [facility] team has been following pt for expected return to his SNF at [place], however spoke with daughter this morning who reports that she and pt's wife and son-in-law after discussion with PMT yesterday are requesting hospice care- residential hospice deemed most appropriate. Daughter Dentist is their preference- CSW made referral and will assist.     Expected Discharge Plan: hospice facility Barriers to Discharge: referred to hospice facility Expected Discharge Plan and Services Expected Discharge Plan: [place]   Discharge Planning Services: CM Consult Post Acute Care Choice: [place] Living arrangements for the past 2 months: Post-Acute Facility                                       Social Determinants of Health (SDOH) Interventions    Readmission Risk Interventions Readmission Risk Prevention Plan 06/07/2018 06/07/2018  Transportation Screening - Complete  PCP or Specialist Appt within 3-5 Days - Complete  HRI or [place] - Complete  Social Work Consult for [place] Planning/Counseling - Complete  Palliative Care Screening Complete Not Complete  Palliative Care Screening Not Complete Comments - MD notified  Medication Review Press photographer) - Complete  Some recent data might be hidden

## 2018-06-08 NOTE — Progress Notes (Signed)
[facility] room available for Colin [name] today. Plan to meet daughter at [facility] to complete paperwork at 1:00.   Will need DC summary sent to [phone].  RN please call report to [phone] before Mr. Matsuo leaves the unit.   Thank you, Erling Conte, LCSW [phone]

## 2018-06-08 NOTE — Discharge Summary (Signed)
Physician Discharge Summary  Colin [name] DZH:[id number] DOB: [birth year] DOA: 06/06/2018  PCP: Deland Pretty, MD  Admit date: 06/06/2018 Discharge date: 06/08/2018  Admitted From: Skilled nursing facility Disposition: Hospice home  Recommendations for Outpatient Follow-up:  1. Follow up with PCP in 1-2 weeks 2. Please obtain BMP/CBC in one week 3. Please follow up on the following pending results:  Home Health: None Equipment/Devices: None  Discharge Condition: Fair CODE STATUS: DNR Diet recommendation: Renal diet  Subjective: Patient seen and examined.  Complains of right hip pain which is chronic.  No new complaint.  Denies shortness of breath.  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 Reportedly, patient did not have any complaint.  Elevated creatinine was confirmed in ED[facility] was consulted to admit the patient.  Patient has dementia.  He is alert and only oriented to place.  He is pleasant.  This is his baseline.  ED Course:Upon arrival to the emergency department, patient's vitals were stable. Repeat blood work-up was done which showed significantly elevated creatinine. His baseline creatinine just about 2 months ago was around 4.5 and today it was 7.71. Normal WBC. Normal vitals. He also underwent CT renal stone which ruled out any obstructive uropathy but it showed cardiomegaly with moderate to large bilateral pleural effusions. Anasarca and advanced atherosclerotic changes of  the abdominal aorta without evidence of an aneurysm.  Of note, he was recently hospitalized about 2 months ago here for acute on chronic kidney disease as well as UTI and slightly elevated troponin which were thought to be secondary to worsening renal disease. According to note, he was also seen by nephrology but he was deemed not a good candidate for HD so palliative care was recommended however after talking to his daughter, it is not known whether he ever saw palliative care at the nursing home. Daughter has no information about that since she has not been able to see the patient for 2 months physically due to COVID-19 situation but at this point in time she wanted him to be full code.  Patient was subsequently admitted to hospital service with a diagnosis of acute on chronic kidney disease.  We gave him some fluids which improved his renal function a bit but then we had to stop the IV fluids due to his pleural effusion.  Despite a pleural effusion, he never complained of any shortness of breath and was not hypoxic at all.  Nephrology was then consulted and once again they deemed him not a candidate for dialysis and recommended palliative versus hospice care and palliative care was involved who talked to the family and patient and finally a decision was made to make him DNR and pursue hospice which has been arranged for him so patient will be discharged today.  Discharge Diagnoses:  Active Problems:   AKI (acute kidney injury) ([place])   Renal failure (ARF), acute on chronic (HCC)   Pressure injury of skin   Palliative care by specialist   Goals of care, counseling/discussion   Pleural effusion    Discharge Instructions  Discharge Instructions    Discharge patient  Complete by:  As directed    Discharge disposition:  50-Hospice/Home   Discharge patient date:  06/08/2018     Allergies as of 06/08/2018      Reactions   Lisinopril Other (See Comments)   REACTION:  Elevated potassium,renal  insuf      Medication List    TAKE these medications   albuterol (2.5 MG/3ML) 0.083% nebulizer solution Commonly known as:  PROVENTIL Take 3 mLs (2.5 mg total) by nebulization every 4 (four) hours as needed for wheezing or shortness of breath.   amLODipine 2.5 MG tablet Commonly known as:  NORVASC Take 2.5 mg daily by mouth.   ezetimibe 10 MG tablet Commonly known as:  ZETIA Take 1 tablet (10 mg total) by mouth daily.   ferrous sulfate 325 (65 FE) MG tablet Take 1 tablet (325 mg total) by mouth daily with breakfast.   Fish Oil 1000 MG Caps Take 1,000 mg by mouth daily.   furosemide 40 MG tablet Commonly known as:  LASIX TAKE 1 TABLET BY MOUTH EVERY DAY   hydrALAZINE 10 MG tablet Commonly known as:  APRESOLINE Take 1 tablet (10 mg total) by mouth every 8 (eight) hours. Please schedule an appt for future refills. 2nd attempt.   insulin aspart 100 UNIT/ML injection Commonly known as:  novoLOG Inject 0-9 Units into the skin 3 (three) times daily with meals.   insulin glargine 100 UNIT/ML injection Commonly known as:  LANTUS Inject 0.04 mLs (4 Units total) into the skin at bedtime.   isosorbide mononitrate 30 MG 24 hr tablet Commonly known as:  IMDUR Take 1 tablet (30 mg total) by mouth daily. ** DO NOT CRUSH **please make overdue appt with Dr. Acie Fredrickson before anymore refills. 3rd attempt   senna-docusate 8.6-50 MG tablet Commonly known as:  Senokot-S Take 1 tablet by mouth at bedtime as needed for mild constipation.   vitamin B-12 1000 MCG tablet Commonly known as:  CYANOCOBALAMIN Take 1,000 mcg by mouth daily.      Follow-up Information    Deland Pretty, MD Follow up in 1 week(s).   Specialty:  Internal Medicine Contact information: [place] [address] [phone]          Allergies  Allergen Reactions  . Lisinopril Other (See Comments)    REACTION:  Elevated potassium,renal insuf    Consultations: Nephrology and  palliative care   Procedures/Studies: Dg Chest 2 View  Result Date: 06/06/2018 CLINICAL DATA:  History of COPD.  Renal failure. EXAM: CHEST - 2 VIEW COMPARISON:  PA and lateral chest 04/14/2018 and 09/17/2015. FINDINGS: The patient has moderate to moderately large pleural effusions, greater on the right, with associated compressive atelectasis. Eventration left hemidiaphragm again seen. There is cardiomegaly and vascular congestion. No pneumothorax. No acute bony abnormality. IMPRESSION: Moderate to moderately large pleural effusions, greater on the right. Cardiomegaly and pulmonary vascular congestion. Electronically Signed   By: Inge Rise M.D.   On: 06/06/2018 21:43   Ct Renal Stone Study  Result Date: 06/06/2018 CLINICAL DATA:  Acute renal failure with hematuria. EXAM: CT ABDOMEN AND PELVIS WITHOUT CONTRAST TECHNIQUE: Multidetector CT imaging of the abdomen and pelvis was performed following the standard protocol without IV contrast. COMPARISON:  CT abdomen pelvis dated 02/29/2016 FINDINGS: Lower chest: There are moderate bilateral pleural effusions, right greater than left. There is significant cardiomegaly and advanced coronary artery calcifications. The intracardiac blood pool is hypodense relative to the adjacent myocardium consistent with anemia. Hepatobiliary: No focal liver  abnormality is seen. Status post cholecystectomy. No biliary dilatation. Pancreas: Unremarkable. No pancreatic ductal dilatation or surrounding inflammatory changes. Spleen: Normal in size without focal abnormality. Adrenals/Urinary Tract: The kidneys are somewhat atrophic bilaterally. There is no hydronephrosis. There are no obstructing radiopaque nephroliths. Vascular calcifications are noted of both renal arteries. Stomach/Bowel: Stomach is within normal limits. Appendix appears normal. No evidence of bowel wall thickening, distention, or inflammatory changes. Vascular/Lymphatic: Aortic atherosclerosis. No enlarged  abdominal or pelvic lymph nodes. 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 There is likely moderate to high-grade stenosis of the origin of the SMA secondary to advanced atherosclerotic calcifications. Bilateral lower extremity bypass grafts are noted. Reproductive: Prostate is unremarkable. Other: Anasarca is noted. There is a small volume of abdominal ascites. Musculoskeletal: Stable lytic lesions are noted in the thoracolumbar spine. Multilevel degenerative changes are again seen. IMPRESSION: 1. No hydronephrosis. No radiopaque obstructing nephroliths. The kidneys are somewhat atrophic which can be seen in patients with end-stage renal disease. 2. Cardiomegaly.  Moderate to large bilateral pleural effusions. 3. Anasarca.  There is a small volume of abdominal ascites. 4. Advanced atherosclerotic changes of the abdominal aorta without evidence of an aneurysm. 5. Duplicated IVC, a normal variant. Electronically Signed   By: Constance Holster M.D.   On: 06/06/2018 17:10      Discharge Exam: Vitals:   06/07/18 1316 06/08/18 0530  BP: 111/79 107/67  Pulse: (!) 54 (!) 109  Resp: 18 20  Temp: 97.7 F (36.5 C) 98 F (36.7 C)  SpO2: 98% 92%   Vitals:   06/07/18 0600 06/07/18 1100 06/07/18 1316 06/08/18 0530  BP:   111/79 107/67  Pulse:   (!) 54 (!) 109  Resp:   18 20  Temp:   97.7 F (36.5 C) 98 F (36.7 C)  TempSrc:   Oral   SpO2:  99% 98% 92%  Weight: 78.9 kg     Height:        General: Pt is alert, awake, not in acute distress Cardiovascular: RRR, S1/S2 +, no rubs, no gallops Respiratory: CTA bilaterally, no wheezing, no rhonchi Abdominal: Soft, NT, ND, bowel sounds + Extremities: +2 pitting edema bilateral lower extremity, no cyanosis    The results of significant diagnostics from this hospitalization (including imaging, microbiology, ancillary and laboratory) are listed below for reference.     Microbiology: Recent Results (from the past 240 hour(s))  SARS Coronavirus 2  (CEPHEID - Performed in [place] hospital lab), Hosp Order     Status: None   Collection Time: 06/06/18  1:02 PM  Result Value Ref Range Status   SARS Coronavirus 2 NEGATIVE NEGATIVE Final    Comment: (NOTE) If result is NEGATIVE SARS-CoV-2 target nucleic acids are NOT DETECTED. The SARS-CoV-2 RNA is generally detectable in upper and lower  respiratory specimens during the acute phase of infection. The lowest  concentration of SARS-CoV-2 viral copies this assay can detect is 250  copies / mL. A negative result does not preclude SARS-CoV-2 infection  and should not be used as the sole basis for treatment or other  patient management decisions.  A negative result may occur with  improper specimen collection / handling, submission of specimen other  than nasopharyngeal swab, presence of viral mutation(s) within the  areas targeted by this assay, and inadequate number of viral copies  (<250 copies / mL). A negative result must be combined with clinical  observations, patient history, and epidemiological information. If result is POSITIVE SARS-CoV-2 target nucleic  acids are DETECTED. The SARS-CoV-2 RNA is generally detectable in upper and lower  respiratory specimens dur ing the acute phase of infection.  Positive  results are indicative of active infection with SARS-CoV-2.  Clinical  correlation with patient history and other diagnostic information is  necessary to determine patient infection status.  Positive results do  not rule out bacterial infection or co-infection with other viruses. If result is PRESUMPTIVE POSTIVE SARS-CoV-2 nucleic acids MAY BE PRESENT.   A presumptive positive result was obtained on the submitted specimen  and confirmed on repeat testing.  While 2019 novel coronavirus  (SARS-CoV-2) nucleic acids may be present in the submitted sample  additional confirmatory testing may be necessary for epidemiological  and / or clinical management purposes  to differentiate  between  SARS-CoV-2 and other Sarbecovirus currently known to infect humans.  If clinically indicated additional testing with an alternate test  methodology [phone]) is advised. The SARS-CoV-2 RNA is generally  detectable in upper and lower respiratory sp ecimens during the acute  phase of infection. The expected result is Negative. Fact Sheet for Patients:  StrictlyIdeas.no Fact Sheet for Healthcare Providers: BankingDealers.co.za This test is not yet approved or cleared by the [place] FDA and has been authorized for detection and/or diagnosis of SARS-CoV-2 by FDA under an Emergency Use Authorization (EUA).  This EUA will remain in effect (meaning this test can be used) for the duration of the COVID-19 declaration under Section 564(b)(1) of the Act, 21 U.S.C. section 360bbb-3(b)(1), unless the authorization is terminated or revoked sooner. Performed at [facility], [place] [address] [place], [place] [address]   MRSA PCR Screening     Status: None   Collection Time: 06/06/18  6:31 PM  Result Value Ref Range Status   MRSA by PCR NEGATIVE NEGATIVE Final    Comment:        The GeneXpert MRSA Assay (FDA approved for NASAL specimens only), is one component of a comprehensive MRSA colonization surveillance program. It is not intended to diagnose MRSA infection nor to guide or monitor treatment for MRSA infections. Performed at [facility], [place] [address] [place], [place] [address]      Labs: BNP (last 3 results) No results for input(s): BNP in the last 8760 hours. Basic Metabolic Panel: Recent Labs  Lab 06/06/18 1431 06/07/18 0536 06/08/18 0725  NA 140 139 137  K 4.9 4.6 4.5  CL 107 109 107  CO2 19* 15* 15*  GLUCOSE 120* 80 101*  BUN 151* 143* 150*  CREATININE 7.71* 7.22* 7.13*  CALCIUM 8.3* 7.9* 8.1*   Liver Function Tests: Recent Labs  Lab 06/06/18 1431 06/07/18 0536  06/08/18 0725  AST 18 18 19   ALT 15 15 16   ALKPHOS 57 45 52  BILITOT 0.6 0.6 0.5  PROT 7.2 6.1* 6.1*  ALBUMIN 3.2* 2.7* 2.7*   No results for input(s): LIPASE, AMYLASE in the last 168 hours. No results for input(s): AMMONIA in the last 168 hours. CBC: Recent Labs  Lab 06/06/18 1431 06/07/18 0536 06/08/18 0725  WBC 4.0 3.8* 3.6*  NEUTROABS 2.9  --  2.4  HGB 11.3* 11.3* 10.6*  HCT 37.4* 36.1* 35.6*  MCV 86.2 83.2 84.2  PLT 145* 94* 133*   Cardiac Enzymes: No results for input(s): CKTOTAL, CKMB, CKMBINDEX, TROPONINI in the last 168 hours. BNP: Invalid input(s): POCBNP CBG: Recent Labs  Lab 06/07/18 0748 06/07/18 0805 06/07/18 1156 06/07/18 1636 06/08/18 0723  GLUCAP 61* 71 108* 108*  94   D-Dimer No results for input(s): DDIMER in the last 72 hours. Hgb A1c Recent Labs    06/07/18 0536  HGBA1C 6.1*   Lipid Profile No results for input(s): CHOL, HDL, LDLCALC, TRIG, CHOLHDL, LDLDIRECT in the last 72 hours. Thyroid function studies No results for input(s): TSH, T4TOTAL, T3FREE, THYROIDAB in the last 72 hours.  Invalid input(s): FREET3 Anemia work up No results for input(s): VITAMINB12, FOLATE, FERRITIN, TIBC, IRON, RETICCTPCT in the last 72 hours. Urinalysis    Component Value Date/Time   COLORURINE YELLOW 06/06/2018 2025   APPEARANCEUR CLOUDY (A) 06/06/2018 2025   LABSPEC 1.012 06/06/2018 2025   PHURINE 5.0 06/06/2018 2025   GLUCOSEU NEGATIVE 06/06/2018 2025   HGBUR MODERATE (A) 06/06/2018 2025   BILIRUBINUR NEGATIVE 06/06/2018 2025   KETONESUR NEGATIVE 06/06/2018 2025   PROTEINUR 100 (A) 06/06/2018 2025   UROBILINOGEN 1.0 10/19/2013 0912   NITRITE NEGATIVE 06/06/2018 2025   LEUKOCYTESUR LARGE (A) 06/06/2018 2025   Sepsis Labs Invalid input(s): PROCALCITONIN,  WBC,  LACTICIDVEN Microbiology Recent Results (from the past 240 hour(s))  SARS Coronavirus 2 (CEPHEID - Performed in [place] hospital lab), Hosp Order     Status: None   Collection Time:  06/06/18  1:02 PM  Result Value Ref Range Status   SARS Coronavirus 2 NEGATIVE NEGATIVE Final    Comment: (NOTE) If result is NEGATIVE SARS-CoV-2 target nucleic acids are NOT DETECTED. The SARS-CoV-2 RNA is generally detectable in upper and lower  respiratory specimens during the acute phase of infection. The lowest  concentration of SARS-CoV-2 viral copies this assay can detect is 250  copies / mL. A negative result does not preclude SARS-CoV-2 infection  and should not be used as the sole basis for treatment or other  patient management decisions.  A negative result may occur with  improper specimen collection / handling, submission of specimen other  than nasopharyngeal swab, presence of viral mutation(s) within the  areas targeted by this assay, and inadequate number of viral copies  (<250 copies / mL). A negative result must be combined with clinical  observations, patient history, and epidemiological information. If result is POSITIVE SARS-CoV-2 target nucleic acids are DETECTED. The SARS-CoV-2 RNA is generally detectable in upper and lower  respiratory specimens dur ing the acute phase of infection.  Positive  results are indicative of active infection with SARS-CoV-2.  Clinical  correlation with patient history and other diagnostic information is  necessary to determine patient infection status.  Positive results do  not rule out bacterial infection or co-infection with other viruses. If result is PRESUMPTIVE POSTIVE SARS-CoV-2 nucleic acids MAY BE PRESENT.   A presumptive positive result was obtained on the submitted specimen  and confirmed on repeat testing.  While 2019 novel coronavirus  (SARS-CoV-2) nucleic acids may be present in the submitted sample  additional confirmatory testing may be necessary for epidemiological  and / or clinical management purposes  to differentiate between  SARS-CoV-2 and other Sarbecovirus currently known to infect humans.  If clinically  indicated additional testing with an alternate test  methodology [phone]) is advised. The SARS-CoV-2 RNA is generally  detectable in upper and lower respiratory sp ecimens during the acute  phase of infection. The expected result is Negative. Fact Sheet for Patients:  StrictlyIdeas.no Fact Sheet for Healthcare Providers: BankingDealers.co.za This test is not yet approved or cleared by the [place] FDA and has been authorized for detection and/or diagnosis of SARS-CoV-2 by FDA under an Emergency Use Authorization (  EUA).  This EUA will remain in effect (meaning this test can be used) for the duration of the COVID-19 declaration under Section 564(b)(1) of the Act, 21 U.S.C. section 360bbb-3(b)(1), unless the authorization is terminated or revoked sooner. Performed at [facility] [place] [address], [place], [place] [address]   MRSA PCR Screening     Status: None   Collection Time: 06/06/18  6:31 PM  Result Value Ref Range Status   MRSA by PCR NEGATIVE NEGATIVE Final    Comment:        The GeneXpert MRSA Assay (FDA approved for NASAL specimens only), is one component of a comprehensive MRSA colonization surveillance program. It is not intended to diagnose MRSA infection nor to guide or monitor treatment for MRSA infections. Performed at [facility], [place] [address], [place], [place] [address]      Time coordinating discharge:  30 minutes  SIGNED:   Darliss Cheney, MD  [facility] 06/08/2018, 11:38 AM Pager [phone]  If 7PM-7AM, please contact night-coverage [email or web address] Password [email or web address]

## 2018-06-10 LAB — URINE CULTURE: Culture: >=100000 — AB

## 2018-06-24 DEATH — deceased

## 2018-06-27 ENCOUNTER — Encounter: Payer: Self-pay | Admitting: Nurse Practitioner
